# Patient Record
Sex: Male | Born: 1935 | Race: White | Hispanic: No | Marital: Married | Smoking: Former smoker
Health system: Southern US, Community
[De-identification: ages and names within clinical notes are randomized; demographics above are authoritative.]

## PROBLEM LIST (undated history)

## (undated) DIAGNOSIS — M8700 Idiopathic aseptic necrosis of unspecified bone: Secondary | ICD-10-CM

## (undated) DIAGNOSIS — M8580 Other specified disorders of bone density and structure, unspecified site: Secondary | ICD-10-CM

## (undated) DIAGNOSIS — E2740 Unspecified adrenocortical insufficiency: Secondary | ICD-10-CM

## (undated) DIAGNOSIS — K635 Polyp of colon: Secondary | ICD-10-CM

## (undated) DIAGNOSIS — H3582 Retinal ischemia: Secondary | ICD-10-CM

## (undated) DIAGNOSIS — I4891 Unspecified atrial fibrillation: Secondary | ICD-10-CM

## (undated) DIAGNOSIS — K4021 Bilateral inguinal hernia, without obstruction or gangrene, recurrent: Secondary | ICD-10-CM

## (undated) DIAGNOSIS — D649 Anemia, unspecified: Secondary | ICD-10-CM

## (undated) DIAGNOSIS — I499 Cardiac arrhythmia, unspecified: Secondary | ICD-10-CM

## (undated) DIAGNOSIS — K5090 Crohn's disease, unspecified, without complications: Secondary | ICD-10-CM

## (undated) DIAGNOSIS — R7989 Other specified abnormal findings of blood chemistry: Secondary | ICD-10-CM

## (undated) HISTORY — DX: Other specified abnormal findings of blood chemistry: R79.89

## (undated) HISTORY — DX: Polyp of colon: K63.5

## (undated) HISTORY — DX: Unspecified atrial fibrillation: I48.91

## (undated) HISTORY — DX: Other specified disorders of bone density and structure, unspecified site: M85.80

## (undated) HISTORY — DX: Anemia, unspecified: D64.9

## (undated) HISTORY — DX: Idiopathic aseptic necrosis of unspecified bone: M87.00

## (undated) HISTORY — DX: Unspecified adrenocortical insufficiency: E27.40

## (undated) HISTORY — PX: TONSILLECTOMY: SUR1361

## (undated) HISTORY — PX: SMALL INTESTINE SURGERY: SHX150

## (undated) HISTORY — PX: ABDOMINAL SURGERY: SHX537

## (undated) SURGERY — COLONOSCOPY WITH PROPOFOL
Anesthesia: Monitor Anesthesia Care

---

## 1998-01-08 ENCOUNTER — Ambulatory Visit (HOSPITAL_COMMUNITY): Admission: RE | Admit: 1998-01-08 | Discharge: 1998-01-08 | Payer: Self-pay | Admitting: Gastroenterology

## 1998-09-16 ENCOUNTER — Encounter: Payer: Self-pay | Admitting: Emergency Medicine

## 1998-09-16 ENCOUNTER — Inpatient Hospital Stay (HOSPITAL_COMMUNITY): Admission: EM | Admit: 1998-09-16 | Discharge: 1998-09-19 | Payer: Self-pay | Admitting: Emergency Medicine

## 1998-09-18 ENCOUNTER — Encounter: Payer: Self-pay | Admitting: Gastroenterology

## 2000-02-25 ENCOUNTER — Encounter: Payer: Self-pay | Admitting: Gastroenterology

## 2000-02-25 ENCOUNTER — Encounter: Admission: RE | Admit: 2000-02-25 | Discharge: 2000-02-25 | Payer: Self-pay | Admitting: Gastroenterology

## 2000-02-28 ENCOUNTER — Encounter: Admission: RE | Admit: 2000-02-28 | Discharge: 2000-02-28 | Payer: Self-pay | Admitting: Gastroenterology

## 2000-02-28 ENCOUNTER — Encounter: Payer: Self-pay | Admitting: Gastroenterology

## 2000-03-03 ENCOUNTER — Encounter: Payer: Self-pay | Admitting: Gastroenterology

## 2000-03-03 ENCOUNTER — Ambulatory Visit (HOSPITAL_COMMUNITY): Admission: RE | Admit: 2000-03-03 | Discharge: 2000-03-03 | Payer: Self-pay | Admitting: Gastroenterology

## 2000-03-27 ENCOUNTER — Encounter: Payer: Self-pay | Admitting: Surgery

## 2000-03-29 ENCOUNTER — Encounter: Payer: Self-pay | Admitting: Surgery

## 2000-03-29 ENCOUNTER — Inpatient Hospital Stay (HOSPITAL_COMMUNITY): Admission: RE | Admit: 2000-03-29 | Discharge: 2000-04-04 | Payer: Self-pay | Admitting: Surgery

## 2000-04-17 ENCOUNTER — Encounter: Payer: Self-pay | Admitting: Otolaryngology

## 2000-04-17 ENCOUNTER — Emergency Department (HOSPITAL_COMMUNITY): Admission: EM | Admit: 2000-04-17 | Discharge: 2000-04-17 | Payer: Self-pay | Admitting: *Deleted

## 2004-05-30 ENCOUNTER — Emergency Department (HOSPITAL_COMMUNITY): Admission: EM | Admit: 2004-05-30 | Discharge: 2004-05-30 | Payer: Self-pay | Admitting: Emergency Medicine

## 2010-03-17 ENCOUNTER — Other Ambulatory Visit: Payer: Self-pay | Admitting: Gastroenterology

## 2010-03-18 ENCOUNTER — Ambulatory Visit
Admission: RE | Admit: 2010-03-18 | Discharge: 2010-03-18 | Disposition: A | Discharge status: Home / Self Care | Payer: Medicare Other | Source: Ambulatory Visit | Attending: Gastroenterology | Admitting: Gastroenterology

## 2010-11-24 ENCOUNTER — Other Ambulatory Visit (HOSPITAL_COMMUNITY): Payer: Self-pay | Admitting: Gastroenterology

## 2010-11-24 DIAGNOSIS — K469 Unspecified abdominal hernia without obstruction or gangrene: Secondary | ICD-10-CM

## 2010-11-24 DIAGNOSIS — K5000 Crohn's disease of small intestine without complications: Secondary | ICD-10-CM

## 2010-12-01 ENCOUNTER — Ambulatory Visit (HOSPITAL_COMMUNITY)
Admission: RE | Admit: 2010-12-01 | Discharge: 2010-12-01 | Disposition: A | Discharge status: Home / Self Care | Payer: Medicare Other | Source: Ambulatory Visit | Attending: Gastroenterology | Admitting: Gastroenterology

## 2010-12-01 DIAGNOSIS — K5090 Crohn's disease, unspecified, without complications: Secondary | ICD-10-CM | POA: Insufficient documentation

## 2010-12-01 DIAGNOSIS — K5710 Diverticulosis of small intestine without perforation or abscess without bleeding: Secondary | ICD-10-CM | POA: Insufficient documentation

## 2010-12-01 DIAGNOSIS — K469 Unspecified abdominal hernia without obstruction or gangrene: Secondary | ICD-10-CM

## 2010-12-01 DIAGNOSIS — K56609 Unspecified intestinal obstruction, unspecified as to partial versus complete obstruction: Secondary | ICD-10-CM | POA: Insufficient documentation

## 2010-12-01 DIAGNOSIS — K449 Diaphragmatic hernia without obstruction or gangrene: Secondary | ICD-10-CM | POA: Insufficient documentation

## 2010-12-01 DIAGNOSIS — K4090 Unilateral inguinal hernia, without obstruction or gangrene, not specified as recurrent: Secondary | ICD-10-CM | POA: Insufficient documentation

## 2010-12-01 DIAGNOSIS — K5000 Crohn's disease of small intestine without complications: Secondary | ICD-10-CM

## 2010-12-23 ENCOUNTER — Ambulatory Visit (INDEPENDENT_AMBULATORY_CARE_PROVIDER_SITE_OTHER): Payer: Medicare Other

## 2010-12-23 DIAGNOSIS — J301 Allergic rhinitis due to pollen: Secondary | ICD-10-CM

## 2010-12-23 DIAGNOSIS — J019 Acute sinusitis, unspecified: Secondary | ICD-10-CM

## 2011-05-16 ENCOUNTER — Emergency Department (INDEPENDENT_AMBULATORY_CARE_PROVIDER_SITE_OTHER): Payer: Medicare Other

## 2011-05-16 ENCOUNTER — Emergency Department (HOSPITAL_BASED_OUTPATIENT_CLINIC_OR_DEPARTMENT_OTHER)
Admission: EM | Admit: 2011-05-16 | Discharge: 2011-05-16 | Disposition: A | Discharge status: Home / Self Care | Payer: Medicare Other | Attending: Emergency Medicine | Admitting: Emergency Medicine

## 2011-05-16 ENCOUNTER — Encounter (HOSPITAL_BASED_OUTPATIENT_CLINIC_OR_DEPARTMENT_OTHER): Payer: Self-pay | Admitting: *Deleted

## 2011-05-16 DIAGNOSIS — M899 Disorder of bone, unspecified: Secondary | ICD-10-CM

## 2011-05-16 DIAGNOSIS — W11XXXA Fall on and from ladder, initial encounter: Secondary | ICD-10-CM

## 2011-05-16 DIAGNOSIS — M47812 Spondylosis without myelopathy or radiculopathy, cervical region: Secondary | ICD-10-CM

## 2011-05-16 DIAGNOSIS — M51379 Other intervertebral disc degeneration, lumbosacral region without mention of lumbar back pain or lower extremity pain: Secondary | ICD-10-CM

## 2011-05-16 DIAGNOSIS — IMO0002 Reserved for concepts with insufficient information to code with codable children: Secondary | ICD-10-CM | POA: Insufficient documentation

## 2011-05-16 DIAGNOSIS — S0190XA Unspecified open wound of unspecified part of head, initial encounter: Secondary | ICD-10-CM

## 2011-05-16 DIAGNOSIS — G319 Degenerative disease of nervous system, unspecified: Secondary | ICD-10-CM

## 2011-05-16 DIAGNOSIS — S0990XA Unspecified injury of head, initial encounter: Secondary | ICD-10-CM | POA: Insufficient documentation

## 2011-05-16 DIAGNOSIS — S0100XA Unspecified open wound of scalp, initial encounter: Secondary | ICD-10-CM | POA: Insufficient documentation

## 2011-05-16 DIAGNOSIS — S0101XA Laceration without foreign body of scalp, initial encounter: Secondary | ICD-10-CM

## 2011-05-16 DIAGNOSIS — M7918 Myalgia, other site: Secondary | ICD-10-CM

## 2011-05-16 DIAGNOSIS — R51 Headache: Secondary | ICD-10-CM | POA: Insufficient documentation

## 2011-05-16 DIAGNOSIS — M545 Low back pain, unspecified: Secondary | ICD-10-CM | POA: Insufficient documentation

## 2011-05-16 DIAGNOSIS — M542 Cervicalgia: Secondary | ICD-10-CM

## 2011-05-16 DIAGNOSIS — W19XXXA Unspecified fall, initial encounter: Secondary | ICD-10-CM

## 2011-05-16 DIAGNOSIS — S5010XA Contusion of unspecified forearm, initial encounter: Secondary | ICD-10-CM | POA: Insufficient documentation

## 2011-05-16 DIAGNOSIS — M25559 Pain in unspecified hip: Secondary | ICD-10-CM | POA: Insufficient documentation

## 2011-05-16 DIAGNOSIS — M5137 Other intervertebral disc degeneration, lumbosacral region: Secondary | ICD-10-CM

## 2011-05-16 DIAGNOSIS — Y92009 Unspecified place in unspecified non-institutional (private) residence as the place of occurrence of the external cause: Secondary | ICD-10-CM | POA: Insufficient documentation

## 2011-05-16 DIAGNOSIS — IMO0001 Reserved for inherently not codable concepts without codable children: Secondary | ICD-10-CM | POA: Insufficient documentation

## 2011-05-16 MED ORDER — IBUPROFEN 600 MG PO TABS: 600.0000 mg | ORAL_TABLET | Freq: Four times a day (QID) | ORAL | Status: AC | PRN

## 2011-05-16 MED ORDER — HYDROCODONE-ACETAMINOPHEN 5-325 MG PO TABS: 2.0000 | ORAL_TABLET | Freq: Four times a day (QID) | ORAL | Status: AC | PRN

## 2011-05-16 NOTE — ED Notes (Signed)
MD at bedside. 

## 2011-05-16 NOTE — ED Provider Notes (Signed)
History  This chart was scribed for Chauncy Passy, MD by Jenne Campus. This patient was seen in room MH07/MH07 and the patient's care was started at 3:16PM.  CSN: [id number]  Arrival date & time 05/16/11  1506   First MD Initiated Contact with Patient 05/16/11 1516      Chief Complaint  Patient presents with  . Fall    Patient is a 78 y.o. male presenting with fall. The history is provided by the patient. No language interpreter was used.  Fall The accident occurred 1 to 2 hours ago. The fall occurred from a ladder. He landed on a hard floor. The volume of blood lost was minimal. The point of impact was the head and left hip. The pain is present in the head, left hip and neck. He was ambulatory at the scene. There was no entrapment after the fall. There was no alcohol use involved in the accident. Pertinent negatives include no fever, no abdominal pain, no nausea, no vomiting, no hematuria, no headaches and no loss of consciousness. The symptoms are aggravated by ambulation and use of the injured limb. He has tried nothing for the symptoms.    TATE ZAGAL is a 78 y.o. male who presents to the Emergency Department complaining of a fall from a ladder that occurred approximately one hour ago. Pt states that he had a ladder leaning against his gutter on his wet deck. He reports that while he was climbing the ladder (was at the 6th or 7th rung), it slipped and fell to the right side while he fell straight down onto his left back and left hip on his deck. He c/o left hip pain, lower back pain, neck stiffness, a bleeding laceration to his head and an abrasion to his left forearm.  Pt was ambulatory after the fall. Pt denies LOC. The pain is worse with walking. He has not taken any medications to improve his pain PTA. He denies chest pain, abdominal pain, HA and emesis as associated symptoms. His last TD vaccine was last year. He has a h/o glaucoma. He denies smoking and alcohol use.  Patient denies  needing anything for pain at this time.  Past Medical History  Diagnosis Date  . Glaucoma     Past Surgical History  Procedure Date  . Tonsillectomy   . Abdominal surgery     History reviewed. No pertinent family history.  History  Substance Use Topics  . Smoking status: Never Smoker   . Smokeless tobacco: Not on file  . Alcohol Use: No      Review of Systems  Constitutional: Negative for fever and chills.  HENT: Positive for neck stiffness. Negative for congestion and sore throat.   Eyes: Negative for pain.  Respiratory: Negative for cough and shortness of breath.   Cardiovascular: Negative for chest pain.  Gastrointestinal: Negative for nausea, vomiting and abdominal pain.  Genitourinary: Negative for dysuria, urgency and hematuria.  Musculoskeletal: Positive for back pain.       Left hip  Skin: Positive for wound (laceration to the head and abrasion to the left forearm).  Neurological: Negative for seizures, loss of consciousness and headaches.  Hematological: Does not bruise/bleed easily.  Psychiatric/Behavioral: Negative for confusion.    Allergies  Review of patient's allergies indicates no known allergies.  Home Medications  No current outpatient prescriptions on file.  Triage Vitals: BP 136/93  Pulse 103  Temp(Src) 97.7 F (36.5 C) (Oral)  Resp 16  Ht 5' 11"  (1.803 m)  Wt 195 lb (88.451 kg)  BMI 27.20 kg/m2  SpO2 100%  Physical Exam  Nursing note and vitals reviewed. Constitutional: He is oriented to person, place, and time. He appears well-developed and well-nourished.  HENT:  Head: Normocephalic.       Laceration to the head that is chevron shaped, 2 cm in length on both sides with 2 mm of gapping with minimal bleeding  Eyes: Conjunctivae and EOM are normal.  Neck: Normal range of motion. Neck supple.       No tenderness upon palpation of neck but pt c/o neck stiffness  Cardiovascular: Normal rate, regular rhythm and normal heart sounds.     Pulmonary/Chest: Effort normal and breath sounds normal. He has no wheezes. He has no rales.  Abdominal: Soft. There is no tenderness.  Musculoskeletal: Normal range of motion. He exhibits no edema.       No tenderness upon palpation of the C, T or L spine, no step offs noted, mild discomfort upon palpation of lower lumbar region, no tenderness upon palpation of the left wrist   Neurological: He is alert and oriented to person, place, and time. No cranial nerve deficit.  Skin: Skin is warm and dry.       Skin tear on left forearm that is 2 mm in size with several small areas of ecchymosis   Psychiatric: He has a normal mood and affect. His behavior is normal.    ED Course  Procedures (including critical care time)  LACERATION REPAIR Performed by: Chauncy Passy Authorized by: Chauncy Passy Consent: Verbal consent obtained. Risks and benefits: risks, benefits and alternatives were discussed Consent given by: patient Patient identity confirmed: provided demographic data Prepped and Draped in normal sterile fashion Wound explored  Laceration Location: scalp  Laceration Length: 2 cm  No Foreign Bodies seen or palpated  Anesthesia: local infiltration  Local anesthetic: none  Irrigation method: syringe Amount of cleaning: standard  Skin closure: staple  Number of sutures: 1  Patient tolerance: Patient tolerated the procedure well with no immediate complications.  DIAGNOSTIC STUDIES: Oxygen Saturation is 100% on room air, normal by my interpretation.    COORDINATION OF CARE: 3:23PM-Discussed CT scan of head and x-ray of back, hip and neck with pt and pt agreed. Pt turned down pain medications.   Labs Reviewed - No data to display Dg Lumbar Spine Complete  05/16/2011  *RADIOLOGY REPORT*  Clinical Data: 78 year old male status post fall from ladder.  Pain greater on the left.  LUMBAR SPINE - COMPLETE 4+ VIEW  Comparison: Upper GI 12/01/2010.  Findings: Normal lumbar  segmentation.  Advanced chronic L5-S1 disc space loss with vacuum disc phenomenon and endplate spurring. Facet hypertrophy also this level.  Lumbar vertebral bodies appear intact.  There is mild wedging of T12 which appears to be chronic. No pars fracture.  Grossly intact visualized sacrum.  IMPRESSION: 1. No acute fracture or listhesis identified in the lumbar spine. 2.  Advanced L5-S1 disc degeneration. 3.  Chronic-appearing T12 wedging.  Original Report Authenticated By: Randall An, M.D.   Dg Pelvis 1-2 Views  05/16/2011  *RADIOLOGY REPORT*  Clinical Data: Fall from ladder.  Low back pain.  PELVIS - 1-2 VIEW  Comparison: None.  Findings:  A single view pelvis demonstrates no acute bone or soft tissue abnormality.  Mild osteopenia is evident.  Degenerative changes are noted the lower lumbar spine and SI joints bilaterally.  IMPRESSION:  1.  Degenerative changes in the lower lumbar spine and SI  joints. 2.  Osteopenia without evidence for acute fracture.  Original Report Authenticated By: Resa Miner. MATTERN, M.D.   Ct Head Wo Contrast  05/16/2011  *RADIOLOGY REPORT*  Clinical Data:  Fall from ladder.  Laceration of head.  Possible loss of consciousness.  Pain in the posterior left neck.  CT HEAD WITHOUT CONTRAST CT CERVICAL SPINE WITHOUT CONTRAST  Technique:  Multidetector CT imaging of the head and cervical spine was performed following the standard protocol without intravenous contrast.  Multiplanar CT image reconstructions of the cervical spine were also generated.  Comparison:   None  CT HEAD  Findings: Mild generalized atrophy is present.  Mild diffuse white matter hypoattenuation is evident as well.  No acute cortical infarct, hemorrhage, mass lesion is present.  The ventricles are normal size.  No significant extra-axial fluid collection is present.  Chronic right maxillary sinus disease is evident with a shrunken sinus.  A remote to right medial orbital blowout fracture is evident.  No  significant mucosal disease or fluid levels are present.  Mastoid air cells are clear.  The debris in the external auditory canals bilaterally likely represents cerumen.  IMPRESSION:  1.  No acute intracranial abnormality or significant interval change. 2.  No evidence for acute trauma. 3.  Mild atrophy white matter disease.  This likely reflects the sequelae of chronic microvascular ischemia. 4.  Shrunken right maxillary sinus compatible with chronic disease.  CT CERVICAL SPINE  Findings: Cervical spine is imaged from the skull base through the cervicothoracic junction.  The prevertebral soft tissues are within normal limits.  Minimal atherosclerotic calcifications are noted at the right bifurcation.  Multilevel uncovertebral disease and facet hypertrophy leads to significant foraminal stenosis, worse on the right is C3-4, severe on the left at C4-5, severe on the right at C5-6, and moderate to severe on the left at C6-7.  Mild emphysematous changes are evident at the lung apices.  IMPRESSION:  1.  Moderate spondylosis of the cervical spine to the with right foraminal stenosis greatest at C3-4 and C5-6 and left-sided foraminal stenosis greatest at C4-5 and C6-7. 2.  No acute fracture or traumatic subluxation. 3.  Emphysema.  Original Report Authenticated By: Resa Miner. MATTERN, M.D.   Ct Cervical Spine Wo Contrast  05/16/2011  *RADIOLOGY REPORT*  Clinical Data:  Fall from ladder.  Laceration of head.  Possible loss of consciousness.  Pain in the posterior left neck.  CT HEAD WITHOUT CONTRAST CT CERVICAL SPINE WITHOUT CONTRAST  Technique:  Multidetector CT imaging of the head and cervical spine was performed following the standard protocol without intravenous contrast.  Multiplanar CT image reconstructions of the cervical spine were also generated.  Comparison:   None  CT HEAD  Findings: Mild generalized atrophy is present.  Mild diffuse white matter hypoattenuation is evident as well.  No acute cortical  infarct, hemorrhage, mass lesion is present.  The ventricles are normal size.  No significant extra-axial fluid collection is present.  Chronic right maxillary sinus disease is evident with a shrunken sinus.  A remote to right medial orbital blowout fracture is evident.  No significant mucosal disease or fluid levels are present.  Mastoid air cells are clear.  The debris in the external auditory canals bilaterally likely represents cerumen.  IMPRESSION:  1.  No acute intracranial abnormality or significant interval change. 2.  No evidence for acute trauma. 3.  Mild atrophy white matter disease.  This likely reflects the sequelae of chronic microvascular ischemia. 4.  Shrunken right  maxillary sinus compatible with chronic disease.  CT CERVICAL SPINE  Findings: Cervical spine is imaged from the skull base through the cervicothoracic junction.  The prevertebral soft tissues are within normal limits.  Minimal atherosclerotic calcifications are noted at the right bifurcation.  Multilevel uncovertebral disease and facet hypertrophy leads to significant foraminal stenosis, worse on the right is C3-4, severe on the left at C4-5, severe on the right at C5-6, and moderate to severe on the left at C6-7.  Mild emphysematous changes are evident at the lung apices.  IMPRESSION:  1.  Moderate spondylosis of the cervical spine to the with right foraminal stenosis greatest at C3-4 and C5-6 and left-sided foraminal stenosis greatest at C4-5 and C6-7. 2.  No acute fracture or traumatic subluxation. 3.  Emphysema.  Original Report Authenticated By: Resa Miner. MATTERN, M.D.     1. Musculoskeletal pain   2. Fall   3. Scalp laceration       MDM  Patient was evaluated by myself. I low suspicion for hip fracture given the patient a name Nanine Means. He did complain of pain over the left lower back. Plain films of the lumbar spine and pelvis were performed at that injury at this location was more likely. Given patient's fall he  did have a CT of his head. He did not have any loss of consciousness. Patient complained of neck stiffness and CT of the C-spine was performed as well. Laceration was repaired by myself with a single staple placed. Patient was told to return to any physician in 7 days for staple removal. Patient did complain of some abdominal discomfort following my initial evaluation. We discussed that if he had this initially I would have performed a CT of abdomen and pelvis with IV contrast. Patient reported that this was very much in keeping with his chronic issues with Crohn's disease and that he did not want a CT today. Given his presentation I felt that this was appropriate. Patient was discharged in good condition with prescription for ibuprofen 600 mg by mouth 3 times a day as well as Vicodin. He was told to anticipate that he'll have increased soreness tomorrow and possibly even the day after that.      I personally performed the services described in this documentation, which was scribed in my presence. The recorded information has been reviewed and considered.      Chauncy Passy, MD 05/16/11 [phone]

## 2011-05-16 NOTE — ED Notes (Signed)
Pt states he fell off a ladder 1 hr ago. Pain in left hip, lower back. Laceration on top of head and left wrist - denies pain on these sites.

## 2011-05-16 NOTE — ED Notes (Signed)
Pt c/o fall from ladder x 1 hr ago, pt c/o lower back and left hip pain, laceration to head and abrasion to left arm

## 2011-05-16 NOTE — Discharge Instructions (Signed)
Laceration Care, Adult A laceration is a cut or lesion that goes through all layers of the skin and into the tissue just beneath the skin. TREATMENT  Some lacerations may not require closure. Some lacerations may not be able to be closed due to an increased risk of infection. It is important to see your caregiver as soon as possible after an injury to minimize the risk of infection and maximize the opportunity for successful closure. If closure is appropriate, pain medicines may be given, if needed. The wound will be cleaned to help prevent infection. Your caregiver will use stitches (sutures), staples, wound glue (adhesive), or skin adhesive strips to repair the laceration. These tools bring the skin edges together to allow for faster healing and a better cosmetic outcome. However, all wounds will heal with a scar. Once the wound has healed, scarring can be minimized by covering the wound with sunscreen during the day for 1 full year. HOME CARE INSTRUCTIONS  For sutures or staples:  Keep the wound clean and dry.   If you were given a bandage (dressing), you should change it at least once a day. Also, change the dressing if it becomes wet or dirty, or as directed by your caregiver.   Wash the wound with soap and water 2 times a day. Rinse the wound off with water to remove all soap. Pat the wound dry with a clean towel.   After cleaning, apply a thin layer of the antibiotic ointment as recommended by your caregiver. This will help prevent infection and keep the dressing from sticking.   You may shower as usual after the first 24 hours. Do not soak the wound in water until the sutures are removed.   Only take over-the-counter or prescription medicines for pain, discomfort, or fever as directed by your caregiver.   Get your sutures or staples removed as directed by your caregiver.  For skin adhesive strips:  Keep the wound clean and dry.   Do not get the skin adhesive strips wet. You may bathe  carefully, using caution to keep the wound dry.   If the wound gets wet, pat it dry with a clean towel.   Skin adhesive strips will fall off on their own. You may trim the strips as the wound heals. Do not remove skin adhesive strips that are still stuck to the wound. They will fall off in time.  For wound adhesive:  You may briefly wet your wound in the shower or bath. Do not soak or scrub the wound. Do not swim. Avoid periods of heavy perspiration until the skin adhesive has fallen off on its own. After showering or bathing, gently pat the wound dry with a clean towel.   Do not apply liquid medicine, cream medicine, or ointment medicine to your wound while the skin adhesive is in place. This may loosen the film before your wound is healed.   If a dressing is placed over the wound, be careful not to apply tape directly over the skin adhesive. This may cause the adhesive to be pulled off before the wound is healed.   Avoid prolonged exposure to sunlight or tanning lamps while the skin adhesive is in place. Exposure to ultraviolet light in the first year will darken the scar.   The skin adhesive will usually remain in place for 5 to 10 days, then naturally fall off the skin. Do not pick at the adhesive film.  You may need a tetanus shot if:  You  cannot remember when you had your last tetanus shot.   You have never had a tetanus shot.  If you get a tetanus shot, your arm may swell, get red, and feel warm to the touch. This is common and not a problem. If you need a tetanus shot and you choose not to have one, there is a rare chance of getting tetanus. Sickness from tetanus can be serious. SEEK MEDICAL CARE IF:   You have redness, swelling, or increasing pain in the wound.   You see a red line that goes away from the wound.   You have yellowish-white fluid (pus) coming from the wound.   You have a fever.   You notice a bad smell coming from the wound or dressing.   Your wound breaks  open before or after sutures have been removed.   You notice something coming out of the wound such as wood or glass.   Your wound is on your hand or foot and you cannot move a finger or toe.  SEEK IMMEDIATE MEDICAL CARE IF:   Your pain is not controlled with prescribed medicine.   You have severe swelling around the wound causing pain and numbness or a change in color in your arm, hand, leg, or foot.   Your wound splits open and starts bleeding.   You have worsening numbness, weakness, or loss of function of any joint around or beyond the wound.   You develop painful lumps near the wound or on the skin anywhere on your body.  MAKE SURE YOU:   Understand these instructions.   Will watch your condition.   Will get help right away if you are not doing well or get worse.  Document Released: 12/27/2004 Document Revised: 12/16/2010 Document Reviewed: 06/22/2010 [facility] Patient Information 2012 [place].Musculoskeletal Pain Musculoskeletal pain is muscle and boney aches and pains. These pains can occur in any part of the body. Your caregiver may treat you without knowing the cause of the pain. They may treat you if blood or urine tests, X-rays, and other tests were normal.  CAUSES There is often not a definite cause or reason for these pains. These pains may be caused by a type of germ (virus). The discomfort may also come from overuse. Overuse includes working out too hard when your body is not fit. Boney aches also come from weather changes. Bone is sensitive to atmospheric pressure changes. HOME CARE INSTRUCTIONS   Ask when your test results will be ready. Make sure you get your test results.   Only take over-the-counter or prescription medicines for pain, discomfort, or fever as directed by your caregiver. If you were given medications for your condition, do not drive, operate machinery or power tools, or sign legal documents for 24 hours. Do not drink alcohol. Do not take  sleeping pills or other medications that may interfere with treatment.   Continue all activities unless the activities cause more pain. When the pain lessens, slowly resume normal activities. Gradually increase the intensity and duration of the activities or exercise.   During periods of severe pain, bed rest may be helpful. Lay or sit in any position that is comfortable.   Putting ice on the injured area.   Put ice in a bag.   Place a towel between your skin and the bag.   Leave the ice on for 15 to 20 minutes, 3 to 4 times a day.   Follow up with your caregiver for continued problems and no  reason can be found for the pain. If the pain becomes worse or does not go away, it may be necessary to repeat tests or do additional testing. Your caregiver may need to look further for a possible cause.  SEEK IMMEDIATE MEDICAL CARE IF:  You have pain that is getting worse and is not relieved by medications.   You develop chest pain that is associated with shortness or breath, sweating, feeling sick to your stomach (nauseous), or throw up (vomit).   Your pain becomes localized to the abdomen.   You develop any new symptoms that seem different or that concern you.  MAKE SURE YOU:   Understand these instructions.   Will watch your condition.   Will get help right away if you are not doing well or get worse.  Document Released: 12/27/2004 Document Revised: 12/16/2010 Document Reviewed: 08/17/2007 [facility] Patient Information 2012 [place].Staple Wound Closure Your wound has been cleaned and closed with skin staples. HOME CARE  Keep the area around the staples clean and dry.   Rest and raise (elevate) the injured part above the level of your heart.   See your doctor for a follow-up check of the wound.   See your doctor to have the staples removed.   As the wound heals, you may leave it open to the air and clean it daily with water.   Do not soak the wound in water for long  periods of time.   Watch for signs of a wound infection:   Unusual redness or puffiness around the wound.   Increasing pain or tenderness.   Yellowish white fluid (pus) coming from the wound.  You may need a tetanus shot if:  You cannot remember when you had your last tetanus shot.   You have never had a tetanus shot.   The injury broke your skin.  If you need a tetanus shot and you choose not to have one, you may get tetanus. Sickness from tetanus can be serious. GET HELP RIGHT AWAY IF:   You think the wound is infected.   The wound does not stay together after the staples have been taken out.   Something comes out of the wound, such as wood or glass.   You or your child has problems moving the injured area.   You or your child has a temperature by mouth above 102 F (38.9 C), not controlled by medicine.  MAKE SURE YOU:   Understand these instructions.   Will watch this condition.   Will get help right away if you or your child is not doing well or gets worse.  Document Released: 10/06/2007 Document Revised: 12/16/2010 Document Reviewed: 12/26/2007 [facility] Patient Information 2012 [place].

## 2011-11-16 ENCOUNTER — Encounter (INDEPENDENT_AMBULATORY_CARE_PROVIDER_SITE_OTHER): Payer: Medicare Other | Admitting: Ophthalmology

## 2011-11-16 DIAGNOSIS — H43819 Vitreous degeneration, unspecified eye: Secondary | ICD-10-CM

## 2011-11-16 DIAGNOSIS — H2700 Aphakia, unspecified eye: Secondary | ICD-10-CM

## 2011-11-16 DIAGNOSIS — H3530 Unspecified macular degeneration: Secondary | ICD-10-CM

## 2011-11-16 DIAGNOSIS — H35359 Cystoid macular degeneration, unspecified eye: Secondary | ICD-10-CM

## 2011-11-30 ENCOUNTER — Other Ambulatory Visit: Payer: Self-pay | Admitting: *Deleted

## 2011-11-30 DIAGNOSIS — H349 Unspecified retinal vascular occlusion: Secondary | ICD-10-CM

## 2011-12-05 ENCOUNTER — Encounter: Payer: Self-pay | Admitting: Vascular Surgery

## 2011-12-06 ENCOUNTER — Ambulatory Visit (INDEPENDENT_AMBULATORY_CARE_PROVIDER_SITE_OTHER): Payer: Medicare Other | Admitting: Vascular Surgery

## 2011-12-06 ENCOUNTER — Other Ambulatory Visit (INDEPENDENT_AMBULATORY_CARE_PROVIDER_SITE_OTHER): Payer: Medicare Other | Admitting: *Deleted

## 2011-12-06 ENCOUNTER — Encounter: Payer: Self-pay | Admitting: Vascular Surgery

## 2011-12-06 VITALS — BP 136/83 | HR 74 | Resp 16 | Ht 71.0 in | Wt 190.5 lb

## 2011-12-06 DIAGNOSIS — H3582 Retinal ischemia: Secondary | ICD-10-CM

## 2011-12-06 DIAGNOSIS — H349 Unspecified retinal vascular occlusion: Secondary | ICD-10-CM

## 2011-12-06 NOTE — Progress Notes (Signed)
[facility] [place]   Patient name: Michael [name] MRN: [id number] DOB: January 01, 1933 Sex: male   Referred by: Zigmund Daniel  Reason for referral:  Chief Complaint  Patient presents with  . New Evaluation    retinal ischemia  referred by Tempie Hoist MD    HISTORY OF PRESENT ILLNESS:  the patient is an otherwise healthy 78 year old gentleman who is being evaluated for vision changes in his right eye. Dr. Zigmund Daniel saw him for further evaluation of this and did not report any specific: Hollenhorst plaques but did raise the question of possible retinal ischemia. He is seen today for duplex to rule out carotid disease. He specifically denies any prior history of amaurosis fugax, transient ischemic attack or stroke. He denies any cardiac history. He does have a history of Crohn's disease  Past Medical History  Diagnosis Date  . Glaucoma(365)   . Anemia     Past Surgical History  Procedure Date  . Tonsillectomy   . Abdominal surgery   . Small intestine surgery 1968, 2001     related to Chrohn's disease    History   Social History  . Marital Status: Married    Spouse Name: N/A    Number of Children: N/A  . Years of Education: N/A   Occupational History  . Not on file.   Social History Main Topics  . Smoking status: Former Smoker    Quit date: 04/01/1976  . Smokeless tobacco: Not on file  . Alcohol Use: Yes     Comment: 2-3  glass wine  weekly  1-2 cans beer/ week  . Drug Use: No  . Sexually Active: No   Other Topics Concern  . Not on file   Social History Narrative  . No narrative on file    Family History  Problem Relation Age of Onset  . Deep vein thrombosis Mother   . Heart disease Father     Allergies as of 12/06/2011  . (No Known Allergies)    Current Outpatient Prescriptions on File Prior to Visit  Medication Sig Dispense Refill  . Cyanocobalamin (VITAMIN B-12 IJ) Inject as directed.      . IRON PO Take 1 tablet by mouth daily.       Marland Kitchen latanoprost (XALATAN) 0.005 % ophthalmic solution Place 1 drop into both eyes at bedtime.         REVIEW OF SYSTEMS:  Positives indicated with an "X"  CARDIOVASCULAR:  [ ]  chest pain   [ ]  chest pressure   [ ]  palpitations   [ ]  orthopnea   [ ]  dyspnea on exertion   [ ]  claudication   [ ]  rest pain   [ ]  DVT   [ ]  phlebitis PULMONARY:   [ ]  productive cough   [ ]  asthma   [ ]  wheezing NEUROLOGIC:   [ ]  weakness  [ ]  paresthesias  [ ]  aphasia  [ ]  amaurosis  [ ]  dizziness HEMATOLOGIC:   [ ]  bleeding problems   [ ]  clotting disorders MUSCULOSKELETAL:  [ ]  joint pain   [ ]  joint swelling GASTROINTESTINAL: [ ]   blood in stool  [ ]   hematemesis GENITOURINARY:  [ ]   dysuria  [ ]   hematuria PSYCHIATRIC:  [ ]  history of major depression INTEGUMENTARY:  [ ]  rashes  [ ]  ulcers CONSTITUTIONAL:  [ ]  fever   [ ]  chills  PHYSICAL EXAMINATION:  General: The patient is a well-nourished male, in no acute distress.  Vital signs are BP 136/83  Pulse 74  Resp 16  Ht 5' 11"  (1.803 m)  Wt 190 lb 8 oz (86.41 kg)  BMI 26.57 kg/m2 Pulmonary: There is a good air exchange bilaterally without wheezing or rales. Abdomen: Soft and non-tender with normal pitch bowel sounds. Musculoskeletal: There are no major deformities.  There is no significant extremity pain. Neurologic: No focal weakness or paresthesias are detected, Skin: There are no ulcer or rashes noted. Psychiatric: The patient has normal affect. Cardiovascular: There is a regular rate and rhythm without significant murmur appreciated. Pulse status: 2+ radial and 2+ dorsalis pedis pulses bilaterally Carotid arteries without bruits bilaterally  [facility] Vascular Lab Studies:  Ordered and Independently Reviewed this revealed no evidence of carotid stenosis or plaque present. No evidence of proximal stenosis.  Impression and Plan:  Normal a carotid study by physical exam and back carotid duplex. I discussed this at length with the patient and his  wife present. I do not feel that there is any evidence of vascular occlusive disease to explain any changes in his right eye. He was reassured this discussion and will continue his followup with Dr. Zigmund Daniel    Chaundra Abreu [facility] [place] Office: [phone]

## 2011-12-28 ENCOUNTER — Encounter (INDEPENDENT_AMBULATORY_CARE_PROVIDER_SITE_OTHER): Payer: Medicare Other | Admitting: Ophthalmology

## 2011-12-28 DIAGNOSIS — H3530 Unspecified macular degeneration: Secondary | ICD-10-CM

## 2011-12-28 DIAGNOSIS — H43819 Vitreous degeneration, unspecified eye: Secondary | ICD-10-CM

## 2011-12-28 DIAGNOSIS — H35359 Cystoid macular degeneration, unspecified eye: Secondary | ICD-10-CM

## 2012-02-28 ENCOUNTER — Encounter (INDEPENDENT_AMBULATORY_CARE_PROVIDER_SITE_OTHER): Payer: Medicare Other | Admitting: Ophthalmology

## 2012-02-28 DIAGNOSIS — H3530 Unspecified macular degeneration: Secondary | ICD-10-CM

## 2012-02-28 DIAGNOSIS — H43819 Vitreous degeneration, unspecified eye: Secondary | ICD-10-CM

## 2012-02-28 DIAGNOSIS — H35359 Cystoid macular degeneration, unspecified eye: Secondary | ICD-10-CM

## 2012-03-27 ENCOUNTER — Encounter (INDEPENDENT_AMBULATORY_CARE_PROVIDER_SITE_OTHER): Payer: Medicare Other | Admitting: Ophthalmology

## 2012-03-27 DIAGNOSIS — H35359 Cystoid macular degeneration, unspecified eye: Secondary | ICD-10-CM

## 2012-03-27 DIAGNOSIS — I10 Essential (primary) hypertension: Secondary | ICD-10-CM

## 2012-03-27 DIAGNOSIS — H43819 Vitreous degeneration, unspecified eye: Secondary | ICD-10-CM

## 2012-03-27 DIAGNOSIS — H35039 Hypertensive retinopathy, unspecified eye: Secondary | ICD-10-CM

## 2012-03-27 DIAGNOSIS — H4010X0 Unspecified open-angle glaucoma, stage unspecified: Secondary | ICD-10-CM

## 2012-06-26 ENCOUNTER — Encounter (INDEPENDENT_AMBULATORY_CARE_PROVIDER_SITE_OTHER): Payer: Medicare Other | Admitting: Ophthalmology

## 2012-06-26 DIAGNOSIS — H4010X0 Unspecified open-angle glaucoma, stage unspecified: Secondary | ICD-10-CM

## 2012-06-26 DIAGNOSIS — H43819 Vitreous degeneration, unspecified eye: Secondary | ICD-10-CM

## 2012-06-26 DIAGNOSIS — H35359 Cystoid macular degeneration, unspecified eye: Secondary | ICD-10-CM

## 2012-06-26 DIAGNOSIS — I10 Essential (primary) hypertension: Secondary | ICD-10-CM

## 2012-06-26 DIAGNOSIS — H35039 Hypertensive retinopathy, unspecified eye: Secondary | ICD-10-CM

## 2012-06-27 ENCOUNTER — Encounter (INDEPENDENT_AMBULATORY_CARE_PROVIDER_SITE_OTHER): Payer: Medicare Other | Admitting: Ophthalmology

## 2012-10-30 ENCOUNTER — Ambulatory Visit (INDEPENDENT_AMBULATORY_CARE_PROVIDER_SITE_OTHER): Payer: Medicare Other | Admitting: Ophthalmology

## 2012-10-30 DIAGNOSIS — H43819 Vitreous degeneration, unspecified eye: Secondary | ICD-10-CM

## 2012-10-30 DIAGNOSIS — H35359 Cystoid macular degeneration, unspecified eye: Secondary | ICD-10-CM

## 2013-04-01 ENCOUNTER — Encounter (INDEPENDENT_AMBULATORY_CARE_PROVIDER_SITE_OTHER): Payer: Commercial Managed Care - HMO | Admitting: Ophthalmology

## 2013-04-01 DIAGNOSIS — H35359 Cystoid macular degeneration, unspecified eye: Secondary | ICD-10-CM

## 2013-04-01 DIAGNOSIS — H43819 Vitreous degeneration, unspecified eye: Secondary | ICD-10-CM

## 2014-05-12 ENCOUNTER — Ambulatory Visit: Payer: Self-pay | Admitting: Surgery

## 2014-05-12 NOTE — H&P (Signed)
History of Present Illness Michael [name]. [name] MD; 05/12/2014 12:10 PM) Patient words: hernia.  Referred by Dr. Kelton Pillar for evaluation of bilateral inguinal hernias.  GI - Dr. Earle Gell This is a 81 year old male who presents with several years of palpable bilateral inguinal hernias on physical examination. These have never caused any discomfort until last Friday. He had some pain in the medial part of his left groin. This pain improved when he was supine. The patient has long-standing Crohn's disease and has persistent diarrhea and this seems to be unchanged. He denies any new urinary symptoms. The patient has had no further symptoms since Friday. The patient is fairly active and plays tennis twice a week. He presents now to discuss repair of his hernias. Other Problems ([name], LPN; 04/16/9627 52:84 AM) Crohn's Disease  Past Surgical History ([name], LPN; 01/12/2438 10:27 AM) Resection of Small Bowel  Diagnostic Studies History ([name], LPN; 02/14/3662 40:34 AM) Colonoscopy 1-5 years ago  Allergies ([name], LPN; 07/13/2593 63:87 AM) No Known Drug Allergies 05/12/2014  Medication History ([name], LPN; 05/16/4330 95:18 AM) Cosopt Ocumeter Plus (2-0.5% Solution, Ophthalmic) Active. Iron (Ferrous Gluconate) (325MG Tablet, Oral) Active.  Social History ([name], LPN; 08/14/1658 63:01 AM) Alcohol use Moderate alcohol use. Caffeine use Coffee. No drug use Tobacco use Former smoker.  Family History ([name], LPN; 6/0/1093 23:55 AM) Family history unknown First Degree Relatives     Review of Systems ([name] LPN; 07/12/2200 54:27 AM) General Not Present- Appetite Loss, Chills, Fatigue, Fever, Night Sweats, Weight Gain and Weight Loss. Skin Not Present- Change in Wart/Mole, Dryness, Hives, Jaundice, New Lesions, Non-Healing Wounds, Rash and Ulcer. HEENT Not Present- Earache, Hearing Loss, Hoarseness, Nose Bleed,  Oral Ulcers, Ringing in the Ears, Seasonal Allergies, Sinus Pain, Sore Throat, Visual Disturbances, Wears glasses/contact lenses and Yellow Eyes. Respiratory Not Present- Bloody sputum, Chronic Cough, Difficulty Breathing, Snoring and Wheezing. Breast Not Present- Breast Mass, Breast Pain, Nipple Discharge and Skin Changes. Cardiovascular Not Present- Chest Pain, Difficulty Breathing Lying Down, Leg Cramps, Palpitations, Rapid Heart Rate, Shortness of Breath and Swelling of Extremities. Gastrointestinal Present- Abdominal Pain. Not Present- Bloating, Bloody Stool, Change in Bowel Habits, Chronic diarrhea, Constipation, Difficulty Swallowing, Excessive gas, Gets full quickly at meals, Hemorrhoids, Indigestion, Nausea, Rectal Pain and Vomiting. Male Genitourinary Not Present- Frequency, Nocturia, Painful Urination, Pelvic Pain and Urgency. Musculoskeletal Not Present- Back Pain, Joint Pain, Joint Stiffness, Muscle Pain, Muscle Weakness and Swelling of Extremities. Neurological Not Present- Decreased Memory, Fainting, Headaches, Numbness, Seizures, Tingling, Tremor, Trouble walking and Weakness. Psychiatric Not Present- Anxiety, Bipolar, Change in Sleep Pattern, Depression, Fearful and Frequent crying. Endocrine Not Present- Cold Intolerance, Excessive Hunger, Hair Changes, Heat Intolerance, Hot flashes and New Diabetes. Hematology Not Present- Easy Bruising, Excessive bleeding, Gland problems, HIV and Persistent Infections.  Vitals ([name] LPN; 0/06/2374 28:31 AM) 05/12/2014 11:34 AM Weight: 180.8 lb Height: 71in Body Surface Area: 2.03 m Body Mass Index: 25.22 kg/m Pulse: 80 (Regular)  BP: 138/80 (Sitting, Left Arm, Standard)     Physical Exam Rodman Key K. [name] MD; 05/12/2014 12:10 PM)  The physical exam findings are as follows: Note:WDWN in NAD HEENT: EOMI, sclera anicteric Neck: No masses, no thyromegaly Lungs: CTA bilaterally; normal respiratory effort CV: Regular rate  and rhythm; no murmurs Abd: +bowel sounds, soft, non-tender, no masses GU: bilateral descended testes; no testicular masses; large bilateral inguinal hernias (left slightly larger than right) Both hernias are easily reducible when supine. Ext: Well-perfused; no edema Skin: Warm, dry;  no sign of jaundice    Assessment & Plan Michael [name]. Talyn Dessert MD; 05/12/2014 11:57 AM)  BILATERAL RECURRENT INGUINAL HERNIA WITHOUT OBSTRUCTION OR GANGRENE (550.93  K40.21)  Current Plans Schedule for Surgery - open bilateral inguinal hernia repairs with mesh. The surgical procedure has been discussed with the patient. Potential risks, benefits, alternative treatments, and expected outcomes have been explained. All of the patient's questions at this time have been answered. The likelihood of reaching the patient's treatment goal is good. The patient understand the proposed surgical procedure and wishes to proceed. Pt Education - [facility] Umbilical/ Inguinal Hernia HCI   Michael [name]. Georgette Dover, MD, [facility] Surgery  General/ Trauma Surgery  05/12/2014 12:11 PM

## 2014-05-16 ENCOUNTER — Encounter (HOSPITAL_BASED_OUTPATIENT_CLINIC_OR_DEPARTMENT_OTHER): Payer: Self-pay | Admitting: *Deleted

## 2014-05-21 ENCOUNTER — Encounter (HOSPITAL_BASED_OUTPATIENT_CLINIC_OR_DEPARTMENT_OTHER): Payer: Self-pay | Admitting: *Deleted

## 2014-05-21 ENCOUNTER — Ambulatory Visit (HOSPITAL_COMMUNITY): Payer: PPO | Admitting: Certified Registered"

## 2014-05-21 ENCOUNTER — Encounter (HOSPITAL_BASED_OUTPATIENT_CLINIC_OR_DEPARTMENT_OTHER)
Admission: RE | Disposition: A | Discharge status: Home / Self Care | Payer: Self-pay | Source: Ambulatory Visit | Attending: Surgery

## 2014-05-21 ENCOUNTER — Ambulatory Visit (HOSPITAL_BASED_OUTPATIENT_CLINIC_OR_DEPARTMENT_OTHER)
Admission: RE | Admit: 2014-05-21 | Discharge: 2014-05-21 | Disposition: A | Discharge status: Home / Self Care | Payer: PPO | Source: Ambulatory Visit | Attending: Surgery | Admitting: Surgery

## 2014-05-21 DIAGNOSIS — Z79899 Other long term (current) drug therapy: Secondary | ICD-10-CM | POA: Diagnosis not present

## 2014-05-21 DIAGNOSIS — K4021 Bilateral inguinal hernia, without obstruction or gangrene, recurrent: Secondary | ICD-10-CM | POA: Insufficient documentation

## 2014-05-21 DIAGNOSIS — K4020 Bilateral inguinal hernia, without obstruction or gangrene, not specified as recurrent: Secondary | ICD-10-CM | POA: Diagnosis present

## 2014-05-21 DIAGNOSIS — Z87891 Personal history of nicotine dependence: Secondary | ICD-10-CM | POA: Insufficient documentation

## 2014-05-21 DIAGNOSIS — D176 Benign lipomatous neoplasm of spermatic cord: Secondary | ICD-10-CM | POA: Diagnosis not present

## 2014-05-21 HISTORY — DX: Crohn's disease, unspecified, without complications: K50.90

## 2014-05-21 HISTORY — PX: INGUINAL HERNIA REPAIR: SHX194

## 2014-05-21 HISTORY — DX: Bilateral inguinal hernia, without obstruction or gangrene, recurrent: K40.21

## 2014-05-21 LAB — POCT HEMOGLOBIN-HEMACUE: Hemoglobin: 10.3 g/dL — ABNORMAL LOW (ref 13.0–17.0)

## 2014-05-21 SURGERY — REPAIR, HERNIA, INGUINAL, ADULT
Anesthesia: Regional | Site: Abdomen | Laterality: Bilateral

## 2014-05-21 MED ORDER — LACTATED RINGERS IV SOLN
INTRAVENOUS | Status: DC
  Administered 2014-05-21 (×2): via INTRAVENOUS

## 2014-05-21 MED ORDER — DEXAMETHASONE SODIUM PHOSPHATE 4 MG/ML IJ SOLN
INTRAMUSCULAR | Status: DC | PRN
  Administered 2014-05-21: 10 mg via INTRAVENOUS

## 2014-05-21 MED ORDER — ONDANSETRON HCL 4 MG/2ML IJ SOLN
4.0000 mg | INTRAMUSCULAR | Status: DC | PRN
Start: 2014-05-21 — End: 2014-05-21

## 2014-05-21 MED ORDER — OXYCODONE HCL 5 MG PO TABS
5.0000 mg | ORAL_TABLET | Freq: Once | ORAL | Status: AC | PRN
  Administered 2014-05-21: 5 mg via ORAL

## 2014-05-21 MED ORDER — OXYCODONE HCL 5 MG PO TABS
ORAL_TABLET | ORAL | Status: AC
  Filled 2014-05-21: qty 1

## 2014-05-21 MED ORDER — ONDANSETRON HCL 4 MG/2ML IJ SOLN
INTRAMUSCULAR | Status: DC | PRN
  Administered 2014-05-21: 4 mg via INTRAVENOUS

## 2014-05-21 MED ORDER — CEFAZOLIN SODIUM-DEXTROSE 2-3 GM-% IV SOLR
2.0000 g | INTRAVENOUS | Status: AC
  Administered 2014-05-21: 2 g via INTRAVENOUS

## 2014-05-21 MED ORDER — PROPOFOL 10 MG/ML IV BOLUS
INTRAVENOUS | Status: DC | PRN
  Administered 2014-05-21: 120 mg via INTRAVENOUS

## 2014-05-21 MED ORDER — BUPIVACAINE-EPINEPHRINE (PF) 0.25% -1:200000 IJ SOLN
INTRAMUSCULAR | Status: AC
  Filled 2014-05-21: qty 30

## 2014-05-21 MED ORDER — HYDROCODONE-ACETAMINOPHEN 5-325 MG PO TABS: 1.0000 | ORAL_TABLET | ORAL | Status: DC | PRN

## 2014-05-21 MED ORDER — MIDAZOLAM HCL 2 MG/2ML IJ SOLN
1.0000 mg | INTRAMUSCULAR | Status: DC | PRN
  Administered 2014-05-21: 1 mg via INTRAVENOUS

## 2014-05-21 MED ORDER — FENTANYL CITRATE (PF) 100 MCG/2ML IJ SOLN
25.0000 ug | INTRAMUSCULAR | Status: DC | PRN
  Administered 2014-05-21 (×2): 25 ug via INTRAVENOUS

## 2014-05-21 MED ORDER — MIDAZOLAM HCL 2 MG/2ML IJ SOLN
INTRAMUSCULAR | Status: AC
  Filled 2014-05-21: qty 2

## 2014-05-21 MED ORDER — CHLORHEXIDINE GLUCONATE 4 % EX LIQD: 1.0000 "application " | Freq: Once | CUTANEOUS | Status: DC

## 2014-05-21 MED ORDER — SUCCINYLCHOLINE CHLORIDE 20 MG/ML IJ SOLN
INTRAMUSCULAR | Status: AC
  Filled 2014-05-21: qty 1

## 2014-05-21 MED ORDER — FENTANYL CITRATE (PF) 100 MCG/2ML IJ SOLN
INTRAMUSCULAR | Status: DC | PRN
  Administered 2014-05-21: 50 ug via INTRAVENOUS

## 2014-05-21 MED ORDER — MORPHINE SULFATE 2 MG/ML IJ SOLN: 2.0000 mg | INTRAMUSCULAR | Status: DC | PRN

## 2014-05-21 MED ORDER — ROCURONIUM BROMIDE 100 MG/10ML IV SOLN
INTRAVENOUS | Status: DC | PRN
  Administered 2014-05-21: 30 mg via INTRAVENOUS

## 2014-05-21 MED ORDER — FENTANYL CITRATE (PF) 100 MCG/2ML IJ SOLN
INTRAMUSCULAR | Status: AC
  Filled 2014-05-21: qty 2

## 2014-05-21 MED ORDER — NEOSTIGMINE METHYLSULFATE 10 MG/10ML IV SOLN
INTRAVENOUS | Status: DC | PRN
  Administered 2014-05-21: 4 mg via INTRAVENOUS

## 2014-05-21 MED ORDER — ROCURONIUM BROMIDE 50 MG/5ML IV SOLN
INTRAVENOUS | Status: AC
  Filled 2014-05-21: qty 1

## 2014-05-21 MED ORDER — PROMETHAZINE HCL 25 MG/ML IJ SOLN: 6.2500 mg | INTRAMUSCULAR | Status: DC | PRN

## 2014-05-21 MED ORDER — PROPOFOL 10 MG/ML IV BOLUS
INTRAVENOUS | Status: AC
  Filled 2014-05-21: qty 20

## 2014-05-21 MED ORDER — LIDOCAINE HCL (CARDIAC) 20 MG/ML IV SOLN
INTRAVENOUS | Status: DC | PRN
  Administered 2014-05-21: 30 mg via INTRAVENOUS

## 2014-05-21 MED ORDER — FENTANYL CITRATE (PF) 100 MCG/2ML IJ SOLN
50.0000 ug | INTRAMUSCULAR | Status: DC | PRN
  Administered 2014-05-21: 50 ug via INTRAVENOUS

## 2014-05-21 MED ORDER — BUPIVACAINE-EPINEPHRINE (PF) 0.25% -1:200000 IJ SOLN
INTRAMUSCULAR | Status: DC | PRN
Start: 2014-05-21 — End: 2014-05-21
  Administered 2014-05-21 (×2): 30 mL

## 2014-05-21 MED ORDER — GLYCOPYRROLATE 0.2 MG/ML IJ SOLN
0.2000 mg | Freq: Once | INTRAMUSCULAR | Status: AC | PRN
  Administered 2014-05-21: 0.6 mg via INTRAVENOUS

## 2014-05-21 MED ORDER — CEFAZOLIN SODIUM-DEXTROSE 2-3 GM-% IV SOLR
INTRAVENOUS | Status: AC
  Filled 2014-05-21: qty 50

## 2014-05-21 MED ORDER — FENTANYL CITRATE (PF) 100 MCG/2ML IJ SOLN
INTRAMUSCULAR | Status: AC
  Filled 2014-05-21: qty 6

## 2014-05-21 MED ORDER — BUPIVACAINE-EPINEPHRINE 0.25% -1:200000 IJ SOLN
INTRAMUSCULAR | Status: DC | PRN
  Administered 2014-05-21: 20 mL

## 2014-05-21 MED ORDER — OXYCODONE HCL 5 MG/5ML PO SOLN: 5.0000 mg | Freq: Once | ORAL | Status: AC | PRN

## 2014-05-21 SURGICAL SUPPLY — 56 items
APL SKNCLS STERI-STRIP NONHPOA (GAUZE/BANDAGES/DRESSINGS) ×1
BENZOIN TINCTURE PRP APPL 2/3 (GAUZE/BANDAGES/DRESSINGS) ×4 IMPLANT
BLADE CLIPPER SURG (BLADE) ×4 IMPLANT
BLADE HEX COATED 2.75 (ELECTRODE) ×4 IMPLANT
BLADE SURG 15 STRL LF DISP TIS (BLADE) ×4 IMPLANT
BLADE SURG 15 STRL SS (BLADE) ×8
CANISTER SUCT 1200ML W/VALVE (MISCELLANEOUS) IMPLANT
CHLORAPREP W/TINT 26ML (MISCELLANEOUS) ×4 IMPLANT
CLOSURE WOUND 1/2 X4 (GAUZE/BANDAGES/DRESSINGS) ×2
COVER BACK TABLE 60X90IN (DRAPES) ×4 IMPLANT
COVER MAYO STAND STRL (DRAPES) ×4 IMPLANT
DECANTER SPIKE VIAL GLASS SM (MISCELLANEOUS) ×4 IMPLANT
DRAIN PENROSE 1/2X12 LTX STRL (WOUND CARE) ×4 IMPLANT
DRAPE LAPAROTOMY TRNSV 102X78 (DRAPE) ×4 IMPLANT
DRAPE UTILITY XL STRL (DRAPES) ×4 IMPLANT
DRSG TEGADERM 4X4.75 (GAUZE/BANDAGES/DRESSINGS) ×4 IMPLANT
ELECT REM PT RETURN 9FT ADLT (ELECTROSURGICAL) ×4
ELECTRODE REM PT RTRN 9FT ADLT (ELECTROSURGICAL) ×2 IMPLANT
GLOVE BIO SURGEON STRL SZ7 (GLOVE) ×4 IMPLANT
GLOVE BIOGEL PI IND STRL 7.0 (GLOVE) ×2 IMPLANT
GLOVE BIOGEL PI IND STRL 7.5 (GLOVE) ×2 IMPLANT
GLOVE BIOGEL PI INDICATOR 7.0 (GLOVE) ×2
GLOVE BIOGEL PI INDICATOR 7.5 (GLOVE) ×2
GLOVE ECLIPSE 6.5 STRL STRAW (GLOVE) ×4 IMPLANT
GOWN STRL REUS W/ TWL LRG LVL3 (GOWN DISPOSABLE) ×4 IMPLANT
GOWN STRL REUS W/TWL LRG LVL3 (GOWN DISPOSABLE) ×6
MESH PARIETEX PROGRIP LEFT (Mesh General) ×4 IMPLANT
MESH PARIETEX PROGRIP RIGHT (Mesh General) ×4 IMPLANT
NEEDLE HYPO 25X1 1.5 SAFETY (NEEDLE) ×4 IMPLANT
NS IRRIG 1000ML POUR BTL (IV SOLUTION) IMPLANT
PACK BASIN DAY SURGERY FS (CUSTOM PROCEDURE TRAY) ×4 IMPLANT
PENCIL BUTTON HOLSTER BLD 10FT (ELECTRODE) ×4 IMPLANT
SLEEVE SCD COMPRESS KNEE MED (MISCELLANEOUS) ×4 IMPLANT
SPONGE GAUZE 4X4 12PLY STER LF (GAUZE/BANDAGES/DRESSINGS) ×4 IMPLANT
SPONGE INTESTINAL PEANUT (DISPOSABLE) ×4 IMPLANT
SPONGE LAP 18X18 X RAY DECT (DISPOSABLE) IMPLANT
STRIP CLOSURE SKIN 1/2X4 (GAUZE/BANDAGES/DRESSINGS) ×6 IMPLANT
SUT MON AB 4-0 PC3 18 (SUTURE) ×8 IMPLANT
SUT PDS AB 0 CT 36 (SUTURE) IMPLANT
SUT SILK 2 0 SH (SUTURE) IMPLANT
SUT SILK 3 0 SH 30 (SUTURE) IMPLANT
SUT SILK 3 0 TIES 17X18 (SUTURE)
SUT SILK 3-0 18XBRD TIE BLK (SUTURE) IMPLANT
SUT VIC AB 0 CT1 27 (SUTURE) ×4
SUT VIC AB 0 CT1 27XBRD ANBCTR (SUTURE) ×4 IMPLANT
SUT VIC AB 0 SH 27 (SUTURE) ×4 IMPLANT
SUT VIC AB 2-0 SH 27 (SUTURE) ×4
SUT VIC AB 2-0 SH 27XBRD (SUTURE) ×4 IMPLANT
SUT VIC AB 3-0 SH 27 (SUTURE) ×6
SUT VIC AB 3-0 SH 27X BRD (SUTURE) ×4 IMPLANT
SYR CONTROL 10ML LL (SYRINGE) ×4 IMPLANT
TOWEL OR 17X24 6PK STRL BLUE (TOWEL DISPOSABLE) ×4 IMPLANT
TOWEL OR NON WOVEN STRL DISP B (DISPOSABLE) ×4 IMPLANT
TUBE CONNECTING 20'X1/4 (TUBING)
TUBE CONNECTING 20X1/4 (TUBING) IMPLANT
YANKAUER SUCT BULB TIP NO VENT (SUCTIONS) IMPLANT

## 2014-05-21 NOTE — Interval H&P Note (Signed)
History and Physical Interval Note:  05/21/2014 12:23 PM  Michael [name]  has presented today for surgery, with the diagnosis of Bilateral Inguinal Hernias  The various methods of treatment have been discussed with the patient and family. After consideration of risks, benefits and other options for treatment, the patient has consented to  Procedure(s): OPEN BILATERAL INGUINAL HERNIA REPAIRS WITH MESH (Bilateral) INSERTION OF MESH (Bilateral) as a surgical intervention .  The patient's history has been reviewed, patient examined, no change in status, stable for surgery.  I have reviewed the patient's chart and labs.  Questions were answered to the patient's satisfaction.     Seymone Forlenza K.

## 2014-05-21 NOTE — Anesthesia Procedure Notes (Addendum)
Procedure Name: Intubation Date/Time: 05/21/2014 1:08 PM Performed by: BLOCKER, TIMOTHY D Pre-anesthesia Checklist: Patient identified, Emergency Drugs available, Suction available and Patient being monitored Patient Re-evaluated:Patient Re-evaluated prior to inductionOxygen Delivery Method: Circle System Utilized Preoxygenation: Pre-oxygenation with 100% oxygen Intubation Type: IV induction Ventilation: Mask ventilation without difficulty Laryngoscope Size: Mac and 3 Grade View: Grade I Tube type: Oral Tube size: 7.0 mm Number of attempts: 1 Airway Equipment and Method: Stylet and Oral airway Placement Confirmation: ETT inserted through vocal cords under direct vision,  positive ETCO2 and breath sounds checked- equal and bilateral Secured at: 21 cm Tube secured with: Tape Dental Injury: Teeth and Oropharynx as per pre-operative assessment    Anesthesia Regional Block:  TAP block  Pre-Anesthetic Checklist: ,, timeout performed, Correct Patient, Correct Site, Correct Laterality, Correct Procedure, Correct Position, site marked, Risks and benefits discussed,  Surgical consent,  Pre-op evaluation,  At surgeon's request and post-op pain management  Laterality: Right and Left  Prep: chloraprep       Needles:  Injection technique: Single-shot  Needle Type: Echogenic Needle     Needle Length: 9cm 9 cm Needle Gauge: 21 and 21 G    Additional Needles:  Procedures: ultrasound guided (picture in chart) TAP block Narrative:  Start time: 05/21/2014 12:45 PM End time: 05/21/2014 1:00 PM Injection made incrementally with aspirations every 5 mL.  Performed by: Personally  Anesthesiologist: Suzette Battiest  Additional Notes: Risks and benefits discussed. Bilateral TAP blocks performed using sterile technique with live u/s guidance using linear probe. Pt tolerated procedure well with no immediate complications.

## 2014-05-21 NOTE — Progress Notes (Signed)
Assisted Dr. Rodman Comp with right and left, ultrasound guided, transabdominal plane block. Side rails up, monitors on throughout procedure. See vital signs in flow sheet. Tolerated Procedure well.

## 2014-05-21 NOTE — H&P (View-Only) (Signed)
History of Present Illness Michael [name]. Michael Reindl MD; 05/12/2014 12:10 PM) Patient words: hernia.  Referred by Dr. Kelton Pillar for evaluation of bilateral inguinal hernias.  GI - Dr. Earle Gell This is a 81 year old male who presents with several years of palpable bilateral inguinal hernias on physical examination. These have never caused any discomfort until last Friday. He had some pain in the medial part of his left groin. This pain improved when he was supine. The patient has long-standing Crohn's disease and has persistent diarrhea and this seems to be unchanged. He denies any new urinary symptoms. The patient has had no further symptoms since Friday. The patient is fairly active and plays tennis twice a week. He presents now to discuss repair of his hernias. Other Problems ([name], LPN; 08/17/3252 98:26 AM) Crohn's Disease  Past Surgical History ([name], LPN; 04/10/5828 94:07 AM) Resection of Small Bowel  Diagnostic Studies History ([name], LPN; 06/18/879 10:31 AM) Colonoscopy 1-5 years ago  Allergies ([name], LPN; 05/19/4583 92:92 AM) No Known Drug Allergies 05/12/2014  Medication History ([name], LPN; 04/13/6284 38:17 AM) Cosopt Ocumeter Plus (2-0.5% Solution, Ophthalmic) Active. Iron (Ferrous Gluconate) (325MG Tablet, Oral) Active.  Social History ([name], LPN; 07/11/1655 90:38 AM) Alcohol use Moderate alcohol use. Caffeine use Coffee. No drug use Tobacco use Former smoker.  Family History ([name], LPN; 03/13/3830 91:91 AM) Family history unknown First Degree Relatives     Review of Systems ([name] LPN; 06/15/598 45:99 AM) General Not Present- Appetite Loss, Chills, Fatigue, Fever, Night Sweats, Weight Gain and Weight Loss. Skin Not Present- Change in Wart/Mole, Dryness, Hives, Jaundice, New Lesions, Non-Healing Wounds, Rash and Ulcer. HEENT Not Present- Earache, Hearing Loss, Hoarseness, Nose Bleed,  Oral Ulcers, Ringing in the Ears, Seasonal Allergies, Sinus Pain, Sore Throat, Visual Disturbances, Wears glasses/contact lenses and Yellow Eyes. Respiratory Not Present- Bloody sputum, Chronic Cough, Difficulty Breathing, Snoring and Wheezing. Breast Not Present- Breast Mass, Breast Pain, Nipple Discharge and Skin Changes. Cardiovascular Not Present- Chest Pain, Difficulty Breathing Lying Down, Leg Cramps, Palpitations, Rapid Heart Rate, Shortness of Breath and Swelling of Extremities. Gastrointestinal Present- Abdominal Pain. Not Present- Bloating, Bloody Stool, Change in Bowel Habits, Chronic diarrhea, Constipation, Difficulty Swallowing, Excessive gas, Gets full quickly at meals, Hemorrhoids, Indigestion, Nausea, Rectal Pain and Vomiting. Male Genitourinary Not Present- Frequency, Nocturia, Painful Urination, Pelvic Pain and Urgency. Musculoskeletal Not Present- Back Pain, Joint Pain, Joint Stiffness, Muscle Pain, Muscle Weakness and Swelling of Extremities. Neurological Not Present- Decreased Memory, Fainting, Headaches, Numbness, Seizures, Tingling, Tremor, Trouble walking and Weakness. Psychiatric Not Present- Anxiety, Bipolar, Change in Sleep Pattern, Depression, Fearful and Frequent crying. Endocrine Not Present- Cold Intolerance, Excessive Hunger, Hair Changes, Heat Intolerance, Hot flashes and New Diabetes. Hematology Not Present- Easy Bruising, Excessive bleeding, Gland problems, HIV and Persistent Infections.  Vitals ([name] LPN; 07/16/4140 39:53 AM) 05/12/2014 11:34 AM Weight: 180.8 lb Height: 71in Body Surface Area: 2.03 m Body Mass Index: 25.22 kg/m Pulse: 80 (Regular)  BP: 138/80 (Sitting, Left Arm, Standard)     Physical Exam Michael Key K. Hazelgrace Bonham MD; 05/12/2014 12:10 PM)  The physical exam findings are as follows: Note:WDWN in NAD HEENT: EOMI, sclera anicteric Neck: No masses, no thyromegaly Lungs: CTA bilaterally; normal respiratory effort CV: Regular rate  and rhythm; no murmurs Abd: +bowel sounds, soft, non-tender, no masses GU: bilateral descended testes; no testicular masses; large bilateral inguinal hernias (left slightly larger than right) Both hernias are easily reducible when supine. Ext: Well-perfused; no edema Skin: Warm, dry;  no sign of jaundice    Assessment & Plan Michael [name]. Michael Needles MD; 05/12/2014 11:57 AM)  BILATERAL RECURRENT INGUINAL HERNIA WITHOUT OBSTRUCTION OR GANGRENE (550.93  K40.21)  Current Plans Schedule for Surgery - open bilateral inguinal hernia repairs with mesh. The surgical procedure has been discussed with the patient. Potential risks, benefits, alternative treatments, and expected outcomes have been explained. All of the patient's questions at this time have been answered. The likelihood of reaching the patient's treatment goal is good. The patient understand the proposed surgical procedure and wishes to proceed. Pt Education - [facility] Umbilical/ Inguinal Hernia HCI   Michael [name]. Georgette Dover, MD, [facility] Surgery  General/ Trauma Surgery  05/12/2014 12:11 PM

## 2014-05-21 NOTE — Anesthesia Postprocedure Evaluation (Signed)
  Anesthesia Post-op Note  Patient: Michael [name]  Procedure(s) Performed: Procedure(s): OPEN BILATERAL INGUINAL HERNIA REPAIRS WITH MESH (Bilateral)  Patient Location: PACU  Anesthesia Type:GA combined with regional for post-op pain  Level of Consciousness: awake and alert   Airway and Oxygen Therapy: Patient Spontanous Breathing  Post-op Pain: mild  Post-op Assessment: Post-op Vital signs reviewed  Post-op Vital Signs: Reviewed  Last Vitals:  Filed Vitals:   05/21/14 1659  BP: 150/73  Pulse: 63  Temp: 36.5 C  Resp: 16    Complications: No apparent anesthesia complications

## 2014-05-21 NOTE — Transfer of Care (Signed)
Immediate Anesthesia Transfer of Care Note  Patient: Michael [name]  Procedure(s) Performed: Procedure(s): OPEN BILATERAL INGUINAL HERNIA REPAIRS WITH MESH (Bilateral)  Patient Location: PACU  Anesthesia Type:GA combined with regional for post-op pain  Level of Consciousness: awake, alert , oriented and patient cooperative  Airway & Oxygen Therapy: Patient Spontanous Breathing and Patient connected to face mask oxygen  Post-op Assessment: Report given to RN and Post -op Vital signs reviewed and stable  Post vital signs: Reviewed and stable  Last Vitals:  Filed Vitals:   05/21/14 1255  BP: 146/74  Pulse: 68  Temp:   Resp: 14    Complications: No apparent anesthesia complications

## 2014-05-21 NOTE — Anesthesia Preprocedure Evaluation (Addendum)
Anesthesia Evaluation  Patient identified by MRN, date of birth, ID band Patient awake    Reviewed: Allergy & Precautions, NPO status , Patient's Chart, lab work & pertinent test results  Airway Mallampati: II  TM Distance: >3 FB Neck ROM: Full    Dental  (+) Teeth Intact, Dental Advisory Given   Pulmonary former smoker,  breath sounds clear to auscultation        Cardiovascular negative cardio ROS  Rhythm:Regular Rate:Normal     Neuro/Psych negative neurological ROS     GI/Hepatic Neg liver ROS, Crohns dz    Endo/Other  negative endocrine ROS  Renal/GU negative Renal ROS     Musculoskeletal   Abdominal   Peds  Hematology negative hematology ROS (+)   Anesthesia Other Findings   Reproductive/Obstetrics                            Anesthesia Physical Anesthesia Plan  ASA: II  Anesthesia Plan: General and Regional   Post-op Pain Management:    Induction: Intravenous  Airway Management Planned: LMA and Oral ETT  Additional Equipment:   Intra-op Plan:   Post-operative Plan: Extubation in OR  Informed Consent: I have reviewed the patients History and Physical, chart, labs and discussed the procedure including the risks, benefits and alternatives for the proposed anesthesia with the patient or authorized representative who has indicated his/her understanding and acceptance.   Dental advisory given  Plan Discussed with: CRNA  Anesthesia Plan Comments:         Anesthesia Quick Evaluation

## 2014-05-21 NOTE — Op Note (Signed)
Hernia, Open, Procedure Note  Indications: The patient presented with a history of a bilateral, reducible inguinal hernia.    Pre-operative Diagnosis: bilateral reducible inguinal hernia Post-operative Diagnosis: same  Surgeon: Maia Petties.   Assistants: none  Anesthesia: General endotracheal anesthesia and Bilateral TAP blocks  ASA Class: 2  Procedure Details  The patient was seen again in the Holding Room. The risks, benefits, complications, treatment options, and expected outcomes were discussed with the patient. The possibilities of reaction to medication, pulmonary aspiration, perforation of viscus, bleeding, recurrent infection, the need for additional procedures, and development of a complication requiring transfusion or further operation were discussed with the patient and/or family. The likelihood of success in repairing the hernia and returning the patient to their previous functional status is good.  There was concurrence with the proposed plan, and informed consent was obtained. The site of surgery was properly noted/marked. The patient was taken to the Operating Room, identified as Michael [name], and the procedure verified as bilateral inguinal hernia repair. A Time Out was held and the above information confirmed.  The patient was placed in the supine position and underwent induction of anesthesia. The lower abdomen and groin was prepped with Chloraprep and draped in the standard fashion.  We began on the patient's left side.  0.25% Marcaine with epinephrine was used to anesthetize the skin over the mid-portion of the inguinal canal. An oblique incision was made. Dissection was carried down through the subcutaneous tissue with cautery to the external oblique fascia.  We opened the external oblique fascia along the direction of its fibers to the external ring.  We encountered a very large direct hernia sac.  The spermatic cord was circumferentially dissected bluntly and  retracted with a Penrose drain.   The floor of the inguinal canal was inspected and had a very large direct hernia defect.  The hernia sac was dissected down to the preperitoneal space and was reduced.  The floor of the inguinal canal was reapproximated with 0 Vicryl.  We skeletonized the spermatic cord and a small cord lipoma was reduced.  We used a left-sided Progrip mesh which was inserted and deployed across the floor of the inguinal canal. The mesh was tucked underneath the external oblique fascia laterally.  The flap of the mesh was closed around the spermatic cord to recreate the internal inguinal ring.  The mesh was secured to the pubic tubercle with 0 Vicryl.  The external oblique fascia was reapproximated with 2-0 Vicryl.  3-0 Vicryl was used to close the subcutaneous tissues and 4-0 Monocryl was used to close the skin in subcuticular fashion.   We then turned our attention to the right side.  0.25% Marcaine with epinephrine was used to anesthetize the skin over the mid-portion of the inguinal canal. An oblique incision was made. Dissection was carried down through the subcutaneous tissue with cautery to the external oblique fascia.  We opened the external oblique fascia along the direction of its fibers to the external ring.  We encountered another very large direct hernia sac.  The spermatic cord was circumferentially dissected bluntly and retracted with a Penrose drain.   The floor of the inguinal canal was inspected and had a very large direct hernia defect.  The hernia sac was dissected down to the preperitoneal space and was reduced.  The floor of the inguinal canal was closed with 0 Vicryl We skeletonized the spermatic cord and a small cord lipoma was reduced.  We used a right-sided Progrip  mesh which was inserted and deployed across the floor of the inguinal canal. The mesh was tucked underneath the external oblique fascia laterally.  The flap of the mesh was closed around the spermatic cord to  recreate the internal inguinal ring.  The mesh was secured to the pubic tubercle with 0 Vicryl.  The external oblique fascia was reapproximated with 2-0 Vicryl.  3-0 Vicryl was used to close the subcutaneous tissues and 4-0 Monocryl was used to close the skin in subcuticular fashion.    Benzoin and steri-strips were used to seal the incisions.  Clean dressings was applied.  The patient was then extubated and brought to the recovery room in stable condition.  All sponge, instrument, and needle counts were correct prior to closure and at the conclusion of the case.   Estimated Blood Loss: Minimal                 Complications: None; patient tolerated the procedure well.         Disposition: PACU - hemodynamically stable.         Condition: stable   Imogene Burn. Georgette Dover, MD, [facility]  05/21/2014 2:58 PM

## 2014-05-21 NOTE — Discharge Instructions (Signed)
[facility], [place]   INGUINAL HERNIA REPAIR: POST OP INSTRUCTIONS  Always review your discharge instruction sheet given to you by the facility where your surgery was performed. IF YOU HAVE DISABILITY OR FAMILY LEAVE FORMS, YOU MUST BRING THEM TO THE OFFICE FOR PROCESSING.   DO NOT GIVE THEM TO YOUR DOCTOR.  1. A  prescription for pain medication may be given to you upon discharge.  Take your pain medication as prescribed, if needed.  If narcotic pain medicine is not needed, then you may take acetaminophen (Tylenol) or ibuprofen (Advil) as needed. 2. Take your usually prescribed medications unless otherwise directed. 3. If you need a refill on your pain medication, please contact your pharmacy.  They will contact our office to request authorization. Prescriptions will not be filled after 5 pm or on week-ends. 4. You should follow a light diet the first 24 hours after arrival home, such as soup and crackers, etc.  Be sure to include lots of fluids daily.  Resume your normal diet the day after surgery. 5. Most patients will experience some swelling and bruising around the umbilicus or in the groin and scrotum.  Ice packs and reclining will help.  Swelling and bruising can take several days to resolve.  6. It is common to experience some constipation if taking pain medication after surgery.  Increasing fluid intake and taking a stool softener (such as Colace) will usually help or prevent this problem from occurring.  A mild laxative (Milk of Magnesia or Miralax) should be taken according to package directions if there are no bowel movements after 48 hours. 7. Unless discharge instructions indicate otherwise, you may remove your bandages 24-48 hours after surgery, and you may shower at that time.  You will have steri-strips (small skin tapes) in place directly over the incision.  These strips should be left on the skin for 7-10 days. 8. ACTIVITIES:  You may resume regular (light) daily activities  beginning the next day--such as daily self-care, walking, climbing stairs--gradually increasing activities as tolerated.  You may have sexual intercourse when it is comfortable.  Refrain from any heavy lifting or straining until approved by your doctor. a. You may drive when you are no longer taking prescription pain medication, you can comfortably wear a seatbelt, and you can safely maneuver your car and apply brakes. b. RETURN TO WORK:  2-3 weeks with light duty - no lifting over 15 lbs. 9. You should see your doctor in the office for a follow-up appointment approximately 2-3 weeks after your surgery.  Make sure that you call for this appointment within a day or two after you arrive home to insure a convenient appointment time. 10. OTHER INSTRUCTIONS:  __________________________________________________________________________________________________________________________________________________________________________________________  WHEN TO CALL YOUR DOCTOR: 1. Fever over 101.0 2. Inability to urinate 3. Nausea and/or vomiting 4. Extreme swelling or bruising 5. Continued bleeding from incision. 6. Increased pain, redness, or drainage from the incision  The clinic staff is available to answer your questions during regular business hours.  Please dont hesitate to call and ask to speak to one of the nurses for clinical concerns.  If you have a medical emergency, go to the nearest emergency room or call 911.  A surgeon from [facility] is always on call at the hospital   [address], [place], [place], [place]  [address] ?  [address], [place], [place]   [address] [phone]    FAX [phone] Web site:  [email or web address]   Post Anesthesia Home Care Instructions  Activity: Get plenty of rest for the remainder of the day. A responsible adult should stay with you for 24 hours following the procedure.  For the next 24 hours, DO  NOT: -Drive a car -Paediatric nurse -Drink alcoholic beverages -Take any medication unless instructed by your physician -Make any legal decisions or sign important papers.  Meals: Start with liquid foods such as gelatin or soup. Progress to regular foods as tolerated. Avoid greasy, spicy, heavy foods. If nausea and/or vomiting occur, drink only clear liquids until the nausea and/or vomiting subsides. Call your physician if vomiting continues.  Special Instructions/Symptoms: Your throat may feel dry or sore from the anesthesia or the breathing tube placed in your throat during surgery. If this causes discomfort, gargle with warm salt water. The discomfort should disappear within 24 hours.  If you had a scopolamine patch placed behind your ear for the management of post- operative nausea and/or vomiting:  1. The medication in the patch is effective for 72 hours, after which it should be removed.  Wrap patch in a tissue and discard in the trash. Wash hands thoroughly with soap and water. 2. You may remove the patch earlier than 72 hours if you experience unpleasant side effects which may include dry mouth, dizziness or visual disturbances. 3. Avoid touching the patch. Wash your hands with soap and water after contact with the patch.

## 2014-05-23 ENCOUNTER — Encounter (HOSPITAL_BASED_OUTPATIENT_CLINIC_OR_DEPARTMENT_OTHER): Payer: Self-pay | Admitting: Surgery

## 2014-12-29 ENCOUNTER — Encounter (INDEPENDENT_AMBULATORY_CARE_PROVIDER_SITE_OTHER): Payer: PPO | Admitting: Ophthalmology

## 2014-12-29 DIAGNOSIS — H59031 Cystoid macular edema following cataract surgery, right eye: Secondary | ICD-10-CM | POA: Diagnosis not present

## 2014-12-29 DIAGNOSIS — H43813 Vitreous degeneration, bilateral: Secondary | ICD-10-CM

## 2015-01-21 ENCOUNTER — Encounter (INDEPENDENT_AMBULATORY_CARE_PROVIDER_SITE_OTHER): Payer: PPO | Admitting: Ophthalmology

## 2015-01-21 DIAGNOSIS — K222 Esophageal obstruction: Secondary | ICD-10-CM | POA: Diagnosis not present

## 2015-01-21 DIAGNOSIS — Z0001 Encounter for general adult medical examination with abnormal findings: Secondary | ICD-10-CM | POA: Diagnosis not present

## 2015-01-21 DIAGNOSIS — K5000 Crohn's disease of small intestine without complications: Secondary | ICD-10-CM | POA: Diagnosis not present

## 2015-01-21 DIAGNOSIS — H43813 Vitreous degeneration, bilateral: Secondary | ICD-10-CM | POA: Diagnosis not present

## 2015-01-21 DIAGNOSIS — E538 Deficiency of other specified B group vitamins: Secondary | ICD-10-CM | POA: Diagnosis not present

## 2015-01-21 DIAGNOSIS — H59031 Cystoid macular edema following cataract surgery, right eye: Secondary | ICD-10-CM

## 2015-01-21 DIAGNOSIS — Z0000 Encounter for general adult medical examination without abnormal findings: Secondary | ICD-10-CM | POA: Diagnosis not present

## 2015-01-21 DIAGNOSIS — H409 Unspecified glaucoma: Secondary | ICD-10-CM | POA: Diagnosis not present

## 2015-01-21 DIAGNOSIS — K4090 Unilateral inguinal hernia, without obstruction or gangrene, not specified as recurrent: Secondary | ICD-10-CM | POA: Diagnosis not present

## 2015-01-27 DIAGNOSIS — R21 Rash and other nonspecific skin eruption: Secondary | ICD-10-CM | POA: Diagnosis not present

## 2015-01-27 DIAGNOSIS — D692 Other nonthrombocytopenic purpura: Secondary | ICD-10-CM | POA: Diagnosis not present

## 2015-01-27 DIAGNOSIS — Z85828 Personal history of other malignant neoplasm of skin: Secondary | ICD-10-CM | POA: Diagnosis not present

## 2015-02-17 DIAGNOSIS — Z85828 Personal history of other malignant neoplasm of skin: Secondary | ICD-10-CM | POA: Diagnosis not present

## 2015-02-17 DIAGNOSIS — L300 Nummular dermatitis: Secondary | ICD-10-CM | POA: Diagnosis not present

## 2015-02-26 DIAGNOSIS — H401133 Primary open-angle glaucoma, bilateral, severe stage: Secondary | ICD-10-CM | POA: Diagnosis not present

## 2015-02-26 DIAGNOSIS — Z961 Presence of intraocular lens: Secondary | ICD-10-CM | POA: Diagnosis not present

## 2015-03-03 DIAGNOSIS — Z85828 Personal history of other malignant neoplasm of skin: Secondary | ICD-10-CM | POA: Diagnosis not present

## 2015-03-03 DIAGNOSIS — L111 Transient acantholytic dermatosis [Grover]: Secondary | ICD-10-CM | POA: Diagnosis not present

## 2015-03-04 ENCOUNTER — Encounter (INDEPENDENT_AMBULATORY_CARE_PROVIDER_SITE_OTHER): Payer: PPO | Admitting: Ophthalmology

## 2015-03-04 DIAGNOSIS — H59031 Cystoid macular edema following cataract surgery, right eye: Secondary | ICD-10-CM

## 2015-03-04 DIAGNOSIS — H43813 Vitreous degeneration, bilateral: Secondary | ICD-10-CM

## 2015-03-18 DIAGNOSIS — Z85828 Personal history of other malignant neoplasm of skin: Secondary | ICD-10-CM | POA: Diagnosis not present

## 2015-03-18 DIAGNOSIS — L111 Transient acantholytic dermatosis [Grover]: Secondary | ICD-10-CM | POA: Diagnosis not present

## 2015-03-18 DIAGNOSIS — L249 Irritant contact dermatitis, unspecified cause: Secondary | ICD-10-CM | POA: Diagnosis not present

## 2015-03-18 DIAGNOSIS — L820 Inflamed seborrheic keratosis: Secondary | ICD-10-CM | POA: Diagnosis not present

## 2015-03-18 DIAGNOSIS — L2089 Other atopic dermatitis: Secondary | ICD-10-CM | POA: Diagnosis not present

## 2015-03-31 DIAGNOSIS — Z961 Presence of intraocular lens: Secondary | ICD-10-CM | POA: Diagnosis not present

## 2015-03-31 DIAGNOSIS — H401133 Primary open-angle glaucoma, bilateral, severe stage: Secondary | ICD-10-CM | POA: Diagnosis not present

## 2015-04-06 ENCOUNTER — Other Ambulatory Visit (HOSPITAL_COMMUNITY): Payer: Self-pay | Admitting: Internal Medicine

## 2015-04-06 ENCOUNTER — Other Ambulatory Visit: Payer: Self-pay | Admitting: Internal Medicine

## 2015-04-06 ENCOUNTER — Ambulatory Visit (HOSPITAL_COMMUNITY)
Admission: RE | Admit: 2015-04-06 | Discharge: 2015-04-06 | Disposition: A | Discharge status: Home / Self Care | Payer: PPO | Source: Ambulatory Visit | Attending: Internal Medicine | Admitting: Internal Medicine

## 2015-04-06 ENCOUNTER — Encounter (HOSPITAL_COMMUNITY): Payer: Self-pay

## 2015-04-06 DIAGNOSIS — K828 Other specified diseases of gallbladder: Secondary | ICD-10-CM | POA: Diagnosis not present

## 2015-04-06 DIAGNOSIS — D72829 Elevated white blood cell count, unspecified: Secondary | ICD-10-CM

## 2015-04-06 DIAGNOSIS — R197 Diarrhea, unspecified: Secondary | ICD-10-CM | POA: Diagnosis not present

## 2015-04-06 DIAGNOSIS — K6389 Other specified diseases of intestine: Secondary | ICD-10-CM | POA: Diagnosis not present

## 2015-04-06 MED ORDER — IOPAMIDOL (ISOVUE-300) INJECTION 61%
100.0000 mL | Freq: Once | INTRAVENOUS | Status: AC | PRN
  Administered 2015-04-06: 100 mL via INTRAVENOUS

## 2015-04-07 ENCOUNTER — Other Ambulatory Visit: Payer: PPO

## 2015-04-07 DIAGNOSIS — R933 Abnormal findings on diagnostic imaging of other parts of digestive tract: Secondary | ICD-10-CM | POA: Diagnosis not present

## 2015-04-14 DIAGNOSIS — H401133 Primary open-angle glaucoma, bilateral, severe stage: Secondary | ICD-10-CM | POA: Diagnosis not present

## 2015-04-14 DIAGNOSIS — Z961 Presence of intraocular lens: Secondary | ICD-10-CM | POA: Diagnosis not present

## 2015-04-28 DIAGNOSIS — Z85828 Personal history of other malignant neoplasm of skin: Secondary | ICD-10-CM | POA: Diagnosis not present

## 2015-04-28 DIAGNOSIS — L300 Nummular dermatitis: Secondary | ICD-10-CM | POA: Diagnosis not present

## 2015-04-30 DIAGNOSIS — H401133 Primary open-angle glaucoma, bilateral, severe stage: Secondary | ICD-10-CM | POA: Diagnosis not present

## 2015-04-30 DIAGNOSIS — Z961 Presence of intraocular lens: Secondary | ICD-10-CM | POA: Diagnosis not present

## 2015-05-01 ENCOUNTER — Encounter (INDEPENDENT_AMBULATORY_CARE_PROVIDER_SITE_OTHER): Payer: PPO | Admitting: Ophthalmology

## 2015-05-01 DIAGNOSIS — H43813 Vitreous degeneration, bilateral: Secondary | ICD-10-CM

## 2015-05-01 DIAGNOSIS — H34822 Venous engorgement, left eye: Secondary | ICD-10-CM | POA: Diagnosis not present

## 2015-05-01 DIAGNOSIS — H59031 Cystoid macular edema following cataract surgery, right eye: Secondary | ICD-10-CM

## 2015-05-01 DIAGNOSIS — H353111 Nonexudative age-related macular degeneration, right eye, early dry stage: Secondary | ICD-10-CM

## 2015-05-07 ENCOUNTER — Encounter (INDEPENDENT_AMBULATORY_CARE_PROVIDER_SITE_OTHER): Payer: PPO | Admitting: Ophthalmology

## 2015-05-08 DIAGNOSIS — J329 Chronic sinusitis, unspecified: Secondary | ICD-10-CM | POA: Diagnosis not present

## 2015-05-08 DIAGNOSIS — Z9049 Acquired absence of other specified parts of digestive tract: Secondary | ICD-10-CM | POA: Diagnosis not present

## 2015-05-08 DIAGNOSIS — Z9842 Cataract extraction status, left eye: Secondary | ICD-10-CM | POA: Diagnosis not present

## 2015-05-08 DIAGNOSIS — J189 Pneumonia, unspecified organism: Secondary | ICD-10-CM | POA: Diagnosis not present

## 2015-05-08 DIAGNOSIS — R410 Disorientation, unspecified: Secondary | ICD-10-CM | POA: Diagnosis not present

## 2015-05-08 DIAGNOSIS — Z87891 Personal history of nicotine dependence: Secondary | ICD-10-CM | POA: Diagnosis not present

## 2015-05-08 DIAGNOSIS — Z8249 Family history of ischemic heart disease and other diseases of the circulatory system: Secondary | ICD-10-CM | POA: Diagnosis not present

## 2015-05-08 DIAGNOSIS — K50911 Crohn's disease, unspecified, with rectal bleeding: Secondary | ICD-10-CM | POA: Diagnosis not present

## 2015-05-08 DIAGNOSIS — J3489 Other specified disorders of nose and nasal sinuses: Secondary | ICD-10-CM | POA: Diagnosis not present

## 2015-05-08 DIAGNOSIS — D72829 Elevated white blood cell count, unspecified: Secondary | ICD-10-CM | POA: Diagnosis not present

## 2015-05-08 DIAGNOSIS — R000 Tachycardia, unspecified: Secondary | ICD-10-CM | POA: Diagnosis not present

## 2015-05-08 DIAGNOSIS — Z9841 Cataract extraction status, right eye: Secondary | ICD-10-CM | POA: Diagnosis not present

## 2015-05-08 DIAGNOSIS — H9193 Unspecified hearing loss, bilateral: Secondary | ICD-10-CM | POA: Diagnosis not present

## 2015-05-08 DIAGNOSIS — Z823 Family history of stroke: Secondary | ICD-10-CM | POA: Diagnosis not present

## 2015-05-08 DIAGNOSIS — I6782 Cerebral ischemia: Secondary | ICD-10-CM | POA: Diagnosis not present

## 2015-05-08 DIAGNOSIS — H409 Unspecified glaucoma: Secondary | ICD-10-CM | POA: Diagnosis not present

## 2015-05-08 DIAGNOSIS — K219 Gastro-esophageal reflux disease without esophagitis: Secondary | ICD-10-CM | POA: Diagnosis not present

## 2015-05-08 DIAGNOSIS — R4182 Altered mental status, unspecified: Secondary | ICD-10-CM | POA: Diagnosis not present

## 2015-05-08 DIAGNOSIS — R918 Other nonspecific abnormal finding of lung field: Secondary | ICD-10-CM | POA: Diagnosis not present

## 2015-05-09 DIAGNOSIS — J40 Bronchitis, not specified as acute or chronic: Secondary | ICD-10-CM | POA: Diagnosis not present

## 2015-05-09 DIAGNOSIS — J189 Pneumonia, unspecified organism: Secondary | ICD-10-CM | POA: Diagnosis not present

## 2015-05-09 DIAGNOSIS — R4182 Altered mental status, unspecified: Secondary | ICD-10-CM | POA: Diagnosis not present

## 2015-05-14 DIAGNOSIS — J189 Pneumonia, unspecified organism: Secondary | ICD-10-CM | POA: Diagnosis not present

## 2015-05-22 DIAGNOSIS — R5383 Other fatigue: Secondary | ICD-10-CM | POA: Diagnosis not present

## 2015-05-22 DIAGNOSIS — J189 Pneumonia, unspecified organism: Secondary | ICD-10-CM | POA: Diagnosis not present

## 2015-05-28 DIAGNOSIS — Z961 Presence of intraocular lens: Secondary | ICD-10-CM | POA: Diagnosis not present

## 2015-05-28 DIAGNOSIS — H401133 Primary open-angle glaucoma, bilateral, severe stage: Secondary | ICD-10-CM | POA: Diagnosis not present

## 2015-06-05 DIAGNOSIS — H6121 Impacted cerumen, right ear: Secondary | ICD-10-CM | POA: Diagnosis not present

## 2015-06-15 ENCOUNTER — Other Ambulatory Visit: Payer: Self-pay | Admitting: Gastroenterology

## 2015-06-15 ENCOUNTER — Ambulatory Visit
Admission: RE | Admit: 2015-06-15 | Discharge: 2015-06-15 | Disposition: A | Discharge status: Home / Self Care | Payer: PPO | Source: Ambulatory Visit | Attending: Gastroenterology | Admitting: Gastroenterology

## 2015-06-15 DIAGNOSIS — J189 Pneumonia, unspecified organism: Secondary | ICD-10-CM

## 2015-06-17 DIAGNOSIS — Z85828 Personal history of other malignant neoplasm of skin: Secondary | ICD-10-CM | POA: Diagnosis not present

## 2015-06-17 DIAGNOSIS — L300 Nummular dermatitis: Secondary | ICD-10-CM | POA: Diagnosis not present

## 2015-06-17 DIAGNOSIS — L111 Transient acantholytic dermatosis [Grover]: Secondary | ICD-10-CM | POA: Diagnosis not present

## 2015-06-17 DIAGNOSIS — L821 Other seborrheic keratosis: Secondary | ICD-10-CM | POA: Diagnosis not present

## 2015-07-02 ENCOUNTER — Encounter (INDEPENDENT_AMBULATORY_CARE_PROVIDER_SITE_OTHER): Payer: PPO | Admitting: Ophthalmology

## 2015-07-02 DIAGNOSIS — H43822 Vitreomacular adhesion, left eye: Secondary | ICD-10-CM

## 2015-07-02 DIAGNOSIS — H43813 Vitreous degeneration, bilateral: Secondary | ICD-10-CM | POA: Diagnosis not present

## 2015-07-02 DIAGNOSIS — H59031 Cystoid macular edema following cataract surgery, right eye: Secondary | ICD-10-CM | POA: Diagnosis not present

## 2015-07-24 ENCOUNTER — Encounter (INDEPENDENT_AMBULATORY_CARE_PROVIDER_SITE_OTHER): Payer: PPO | Admitting: Ophthalmology

## 2015-07-24 DIAGNOSIS — H43813 Vitreous degeneration, bilateral: Secondary | ICD-10-CM | POA: Diagnosis not present

## 2015-07-24 DIAGNOSIS — H59031 Cystoid macular edema following cataract surgery, right eye: Secondary | ICD-10-CM | POA: Diagnosis not present

## 2015-08-05 DIAGNOSIS — L309 Dermatitis, unspecified: Secondary | ICD-10-CM | POA: Diagnosis not present

## 2015-08-05 DIAGNOSIS — Z961 Presence of intraocular lens: Secondary | ICD-10-CM | POA: Diagnosis not present

## 2015-08-05 DIAGNOSIS — H401133 Primary open-angle glaucoma, bilateral, severe stage: Secondary | ICD-10-CM | POA: Diagnosis not present

## 2015-08-05 DIAGNOSIS — L111 Transient acantholytic dermatosis [Grover]: Secondary | ICD-10-CM | POA: Diagnosis not present

## 2015-08-05 DIAGNOSIS — D485 Neoplasm of uncertain behavior of skin: Secondary | ICD-10-CM | POA: Diagnosis not present

## 2015-08-05 DIAGNOSIS — C44212 Basal cell carcinoma of skin of right ear and external auricular canal: Secondary | ICD-10-CM | POA: Diagnosis not present

## 2015-08-05 DIAGNOSIS — Z85828 Personal history of other malignant neoplasm of skin: Secondary | ICD-10-CM | POA: Diagnosis not present

## 2015-08-05 DIAGNOSIS — L821 Other seborrheic keratosis: Secondary | ICD-10-CM | POA: Diagnosis not present

## 2015-08-17 ENCOUNTER — Other Ambulatory Visit: Payer: Self-pay | Admitting: *Deleted

## 2015-08-17 DIAGNOSIS — I6523 Occlusion and stenosis of bilateral carotid arteries: Secondary | ICD-10-CM

## 2015-08-18 ENCOUNTER — Ambulatory Visit (HOSPITAL_COMMUNITY)
Admission: RE | Admit: 2015-08-18 | Discharge: 2015-08-18 | Disposition: A | Discharge status: Home / Self Care | Payer: PPO | Source: Ambulatory Visit | Attending: Vascular Surgery | Admitting: Vascular Surgery

## 2015-08-18 DIAGNOSIS — I6523 Occlusion and stenosis of bilateral carotid arteries: Secondary | ICD-10-CM | POA: Diagnosis not present

## 2015-08-18 LAB — VAS US CAROTID
LCCADDIAS: 16 cm/s
LEFT ECA DIAS: -10 cm/s
LICADDIAS: -38 cm/s
LICADSYS: -105 cm/s
LICAPDIAS: -15 cm/s
LICAPSYS: -54 cm/s
Left CCA dist sys: 72 cm/s
Left CCA prox dias: 26 cm/s
Left CCA prox sys: 102 cm/s
RIGHT CCA MID DIAS: 18 cm/s
RIGHT ECA DIAS: -13 cm/s
Right CCA prox dias: 15 cm/s
Right CCA prox sys: 84 cm/s
Right cca dist sys: -92 cm/s

## 2015-08-19 ENCOUNTER — Encounter: Payer: Self-pay | Admitting: Vascular Surgery

## 2015-08-20 ENCOUNTER — Encounter: Payer: Self-pay | Admitting: Vascular Surgery

## 2015-08-20 ENCOUNTER — Ambulatory Visit (INDEPENDENT_AMBULATORY_CARE_PROVIDER_SITE_OTHER): Payer: PPO | Admitting: Vascular Surgery

## 2015-08-20 VITALS — BP 156/92 | HR 68 | Temp 96.9°F | Resp 18 | Ht 71.0 in | Wt 181.8 lb

## 2015-08-20 DIAGNOSIS — H3582 Retinal ischemia: Secondary | ICD-10-CM | POA: Diagnosis not present

## 2015-08-20 NOTE — Progress Notes (Signed)
Patient name: Michael [name] MRN: [id number] DOB: 17-Feb-1932 Sex: male  REASON FOR CONSULT: Rule out carotid cause for retinal ischemia right eye  HPI: Michael [name] is a 82 y.o. male, seen today for a carotid evaluation. He is been seen by Dr. 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 He has been treated with some injections and has had some improvement from this. He did undergo carotid duplex in our office in 2013 for similar difficulty and at that time had no significant disease. He specifically denies any episodes of amaurosis fugax, transient ischemic attack or stroke. He is otherwise healthy and quite active and plays tennis regularly.  Past Medical History:  Diagnosis Date  . Anemia   . Crohn's disease ([place])   . Glaucoma   . Inguinal hernia recurrent bilateral     Family History  Problem Relation Age of Onset  . Deep vein thrombosis Mother   . Heart disease Father     SOCIAL HISTORY: Social History   Social History  . Marital status: Married    Spouse name: N/A  . Number of children: N/A  . Years of education: N/A   Occupational History  . Not on file.   Social History Main Topics  . Smoking status: Former Smoker    Quit date: 04/01/1976  . Smokeless tobacco: Never Used  . Alcohol use Yes     Comment: 2-3  glass wine  weekly  1-2 cans beer/ week  . Drug use: No  . Sexual activity: No   Other Topics Concern  . Not on file   Social History Narrative  . No narrative on file    No Known Allergies  Current Outpatient Prescriptions  Medication Sig Dispense Refill  . Cyanocobalamin (VITAMIN B-12 IJ) Inject as directed.    . IRON PO Take 1 tablet by mouth daily.    Marland Kitchen latanoprost (XALATAN) 0.005 % ophthalmic solution Place 1 drop into both eyes at bedtime.    Marland Kitchen HYDROcodone-acetaminophen (NORCO/VICODIN) 5-325 MG per tablet Take 1 tablet by mouth every 4 (four) hours as needed. (Patient not taking: Reported on 08/20/2015) 40  tablet 0   No current facility-administered medications for this visit.     REVIEW OF SYSTEMS:  [X]  denotes positive finding, [ ]  denotes negative finding Cardiac  Comments:  Chest pain or chest pressure:    Shortness of breath upon exertion:    Short of breath when lying flat:    Irregular heart rhythm:        Vascular    Pain in calf, thigh, or hip brought on by ambulation:    Pain in feet at night that wakes you up from your sleep:     Blood clot in your veins:    Leg swelling:         Pulmonary    Oxygen at home:    Productive cough:     Wheezing:         Neurologic    Sudden weakness in arms or legs:     Sudden numbness in arms or legs:     Sudden onset of difficulty speaking or slurred speech:    Temporary loss of vision in one eye:     Problems with dizziness:         Gastrointestinal    Blood in stool:     Vomited blood:         Genitourinary    Burning when  urinating:     Blood in urine:        Psychiatric    Major depression:         Hematologic    Bleeding problems:    Problems with blood clotting too easily:        Skin    Rashes or ulcers:        Constitutional    Fever or chills:      PHYSICAL EXAM: Vitals:   08/20/15 1110 08/20/15 1113  BP: (!) 168/88 (!) 156/92  Pulse: 68   Resp: 18   Temp: (!) 96.9 F (36.1 C)   TempSrc: Oral   SpO2: 100%   Weight: 181 lb 12.8 oz (82.5 kg)   Height: 5' 11"  (1.803 m)     GENERAL: The patient is a well-nourished male, in no acute distress. The vital signs are documented above. CARDIAC: There is a regular rate and rhythm.  VASCULAR: 2+ radial and 2+ dorsalis pedis pulses bilaterally PULMONARY: There is good air exchange bilaterally without wheezing or rales. MUSCULOSKELETAL: There are no major deformities or cyanosis. NEUROLOGIC: No focal weakness or paresthesias are detected. SKIN: There are no ulcers or rashes noted. Does have some scattered telangiectasia over both lower  extremities PSYCHIATRIC: The patient has a normal affect.  DATA:   Carotid duplex today is similar to that from 2013. His wife patency of his carotid arteries bilaterally no evidence of hemodynamic stenosis.  MEDICAL ISSUES:  No evidence of extrinsic occlusive disease. Discussed this at length with patient. He will continue his follow-up with Dr. Zigmund Daniel and see [place] on an as-needed basis   Rickie Gange [facility] [employer] [phone]

## 2015-08-20 NOTE — Progress Notes (Signed)
Vitals:   08/20/15 1110  BP: (!) 168/88  Pulse: 68  Resp: 18  Temp: (!) 96.9 F (36.1 C)  TempSrc: Oral  SpO2: 100%  Weight: 181 lb 12.8 oz (82.5 kg)  Height: 5' 11"  (1.803 m)

## 2015-08-21 ENCOUNTER — Encounter (INDEPENDENT_AMBULATORY_CARE_PROVIDER_SITE_OTHER): Payer: PPO | Admitting: Ophthalmology

## 2015-08-21 DIAGNOSIS — H43813 Vitreous degeneration, bilateral: Secondary | ICD-10-CM | POA: Diagnosis not present

## 2015-08-21 DIAGNOSIS — H59031 Cystoid macular edema following cataract surgery, right eye: Secondary | ICD-10-CM | POA: Diagnosis not present

## 2015-08-27 DIAGNOSIS — Z85828 Personal history of other malignant neoplasm of skin: Secondary | ICD-10-CM | POA: Diagnosis not present

## 2015-08-27 DIAGNOSIS — C44212 Basal cell carcinoma of skin of right ear and external auricular canal: Secondary | ICD-10-CM | POA: Diagnosis not present

## 2015-09-10 ENCOUNTER — Encounter: Payer: Self-pay | Admitting: Ophthalmology

## 2015-10-05 ENCOUNTER — Encounter (INDEPENDENT_AMBULATORY_CARE_PROVIDER_SITE_OTHER): Payer: PPO | Admitting: Ophthalmology

## 2015-10-05 DIAGNOSIS — H59031 Cystoid macular edema following cataract surgery, right eye: Secondary | ICD-10-CM | POA: Diagnosis not present

## 2015-10-05 DIAGNOSIS — H43813 Vitreous degeneration, bilateral: Secondary | ICD-10-CM

## 2015-10-05 DIAGNOSIS — H353111 Nonexudative age-related macular degeneration, right eye, early dry stage: Secondary | ICD-10-CM

## 2015-11-02 DIAGNOSIS — L565 Disseminated superficial actinic porokeratosis (DSAP): Secondary | ICD-10-CM | POA: Diagnosis not present

## 2015-11-02 DIAGNOSIS — L820 Inflamed seborrheic keratosis: Secondary | ICD-10-CM | POA: Diagnosis not present

## 2015-11-02 DIAGNOSIS — Z85828 Personal history of other malignant neoplasm of skin: Secondary | ICD-10-CM | POA: Diagnosis not present

## 2015-11-02 DIAGNOSIS — L309 Dermatitis, unspecified: Secondary | ICD-10-CM | POA: Diagnosis not present

## 2015-11-02 DIAGNOSIS — L821 Other seborrheic keratosis: Secondary | ICD-10-CM | POA: Diagnosis not present

## 2015-11-05 DIAGNOSIS — H401133 Primary open-angle glaucoma, bilateral, severe stage: Secondary | ICD-10-CM | POA: Diagnosis not present

## 2015-11-06 DIAGNOSIS — R351 Nocturia: Secondary | ICD-10-CM | POA: Diagnosis not present

## 2015-11-06 DIAGNOSIS — N390 Urinary tract infection, site not specified: Secondary | ICD-10-CM | POA: Diagnosis not present

## 2015-11-06 DIAGNOSIS — Z125 Encounter for screening for malignant neoplasm of prostate: Secondary | ICD-10-CM | POA: Diagnosis not present

## 2015-11-10 DIAGNOSIS — R05 Cough: Secondary | ICD-10-CM | POA: Diagnosis not present

## 2015-11-10 DIAGNOSIS — R0982 Postnasal drip: Secondary | ICD-10-CM | POA: Diagnosis not present

## 2015-11-15 ENCOUNTER — Emergency Department (HOSPITAL_BASED_OUTPATIENT_CLINIC_OR_DEPARTMENT_OTHER)
Admission: EM | Admit: 2015-11-15 | Discharge: 2015-11-15 | Disposition: A | Discharge status: Home / Self Care | Payer: PPO | Attending: Emergency Medicine | Admitting: Emergency Medicine

## 2015-11-15 ENCOUNTER — Encounter (HOSPITAL_BASED_OUTPATIENT_CLINIC_OR_DEPARTMENT_OTHER): Payer: Self-pay | Admitting: Emergency Medicine

## 2015-11-15 ENCOUNTER — Emergency Department (HOSPITAL_BASED_OUTPATIENT_CLINIC_OR_DEPARTMENT_OTHER): Payer: PPO

## 2015-11-15 DIAGNOSIS — H7291 Unspecified perforation of tympanic membrane, right ear: Secondary | ICD-10-CM

## 2015-11-15 DIAGNOSIS — J069 Acute upper respiratory infection, unspecified: Secondary | ICD-10-CM | POA: Diagnosis not present

## 2015-11-15 DIAGNOSIS — H65192 Other acute nonsuppurative otitis media, left ear: Secondary | ICD-10-CM | POA: Diagnosis not present

## 2015-11-15 DIAGNOSIS — R05 Cough: Secondary | ICD-10-CM | POA: Diagnosis not present

## 2015-11-15 DIAGNOSIS — Z87891 Personal history of nicotine dependence: Secondary | ICD-10-CM | POA: Insufficient documentation

## 2015-11-15 MED ORDER — DEXAMETHASONE 6 MG PO TABS
10.0000 mg | ORAL_TABLET | Freq: Once | ORAL | Status: AC
  Administered 2015-11-15: 10 mg via ORAL
  Filled 2015-11-15: qty 1

## 2015-11-15 NOTE — Discharge Instructions (Signed)
[facility]

## 2015-11-15 NOTE — ED Provider Notes (Signed)
[facility] Provider Note   CSN: [id number] Arrival date & time: 11/15/15  1543  By signing my name below, I, Michael [name], attest that this documentation has been prepared under the direction and in the presence of Michael Blank, MD.  Electronically Signed: Julien [name], ED Scribe. 11/15/15. 4:28 PM.    History   Chief Complaint Chief Complaint  Patient presents with  . Cough    The history is provided by the patient. No language interpreter was used.   HPI Comments: Michael [name] is a 82 y.o. male who has a PMhx of anemia, Crohn's disease, and glaucoma presents to the Emergency Department complaining of constant, gradual worsening, moderate "hacking" cough x 1 week. He has been having associated facial congestion, sore throat x 3 days and left ear pain when swallowing x 2 days. Pt was seen by his PCP two days ago and was prescribed advair and liquid cough syrup. Pt says his symptoms have not gotten any better with what he was prescribed. He has not taken any OTC medication to alleviate his symptoms. Family member notes that pt has a hx of pneumonia in April 2017. Denies fever, chills, congestion, nausea, vomiting, chest pain, shortness of breath, abdominal pain.    PCP: Michael Fair, MD  Past Medical History:  Diagnosis Date  . Anemia   . Crohn's disease (Michael [name])   . Glaucoma   . Inguinal hernia recurrent bilateral     Patient Active Problem List   Diagnosis Date Noted  . 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 ABDOMINAL SURGERY    . INGUINAL HERNIA REPAIR Bilateral 05/21/2014   Procedure: OPEN BILATERAL INGUINAL HERNIA REPAIRS WITH MESH;  Surgeon: Michael Mesa, MD;  Location: [facility];  Service: General;  Laterality: Bilateral;  . SMALL INTESTINE SURGERY  1968, 2001    related to Chrohn's disease  . TONSILLECTOMY         Home Medications    Prior to Admission medications     Medication Sig Start Date End Date Taking? Authorizing Provider  Cyanocobalamin (VITAMIN B-12 IJ) Inject as directed.    Historical Provider, MD  HYDROcodone-acetaminophen (NORCO/VICODIN) 5-325 MG per tablet Take 1 tablet by mouth every 4 (four) hours as needed. Patient not taking: Reported on 08/20/2015 05/21/14   Michael Mesa, MD  IRON PO Take 1 tablet by mouth daily.    Historical Provider, MD  latanoprost (XALATAN) 0.005 % ophthalmic solution Place 1 drop into both eyes at bedtime.    Historical Provider, MD    Family History Family History  Problem Relation Age of Onset  . Deep vein thrombosis Mother   . Heart disease Father     Social History Social History  Substance Use Topics  . Smoking status: Former Smoker    Quit date: 04/01/1976  . Smokeless tobacco: Never Used  . Alcohol use Yes     Comment: 2-3  glass wine  weekly  1-2 cans beer/ week     Allergies   Patient has no known allergies.   Review of Systems Review of Systems  All other systems reviewed and are negative.   A complete 10 system review of systems was obtained and all systems are negative except as noted in the HPI and PMH.    Physical Exam Updated Vital Signs BP 167/92   Pulse 81   Temp 98 F (36.7 C)   Resp 18   Wt 182  lb (82.6 kg)   SpO2 98%   BMI 25.38 kg/m   Physical Exam  Constitutional: He is oriented to person, place, and time. He appears well-developed and well-nourished. No distress.  HENT:  Head: Normocephalic and atraumatic.  Left Ear: A middle ear effusion is present.  Nose: Nose normal.  Mouth/Throat: Mucous membranes are normal. No oropharyngeal exudate, posterior oropharyngeal edema or posterior oropharyngeal erythema.  Hole in right ear drum, no mastoid tenderness, middle ear effusion on left with no erythema. Post nasal drip with mild posterior oropharynx irritation. No PTA.  Eyes: Conjunctivae and EOM are normal. Pupils are equal, round, and reactive to light. Right eye  exhibits no discharge. Left eye exhibits no discharge. No scleral icterus.  Neck: Normal range of motion. Neck supple.  Cardiovascular: Normal rate and regular rhythm.  Exam reveals no gallop and no friction rub.   No murmur heard. Pulmonary/Chest: Effort normal and breath sounds normal. No stridor. No respiratory distress. He has no rales.  Abdominal: Soft. He exhibits no distension. There is no tenderness.  Musculoskeletal: He exhibits no edema or tenderness.  Neurological: He is alert and oriented to person, place, and time.  Skin: Skin is warm and dry. No rash noted. He is not diaphoretic. No erythema.  Psychiatric: He has a normal mood and affect.  Nursing note and vitals reviewed.    ED Treatments / Results  DIAGNOSTIC STUDIES: Oxygen Saturation is 98% on RA, normal by my interpretation.  COORDINATION OF CARE:  4:28 PM Discussed treatment plan with pt at bedside and pt agreed to plan.  Labs (all labs ordered are listed, but only abnormal results are displayed) Labs Reviewed - No data to display  EKG  EKG Interpretation None       Radiology Dg Chest 2 View  Result Date: 11/15/2015 CLINICAL DATA:  Cough. Congestion. Sore throat for 4 days. Ex-smoker. EXAM: CHEST  2 VIEW COMPARISON:  06/15/2015 FINDINGS: Moderate lower thoracic spondylosis. Midline trachea. Normal heart size. Tortuous thoracic aorta. No pleural effusion or pneumothorax. Vague increased density over the right apex is again identified, felt to relate to anterior first right rib. Clear lungs. IMPRESSION: No acute cardiopulmonary disease. Electronically Signed   By: Michael [name] M.D.   On: 11/15/2015 16:16    Procedures Procedures (including critical care time)  Medications Ordered in ED Medications  dexamethasone (DECADRON) tablet 10 mg (not administered)     Initial Impression / Assessment and Plan / ED Course  I have reviewed the triage vital signs and the nursing notes.  Pertinent labs & imaging  results that were available during my care of the patient were reviewed by me and considered in my medical decision making (see chart for details).  Clinical Course     82 y.o. male presents with cough, rhinorrhea for 5 days with sore throat and left otalgia for 2 days. adequate oral hydration. Rest of history as above.  Patient appears well. No signs of toxicity, patient is interactive and playful. No hypoxia, tachypnea or other signs of respiratory distress. No sign of clinical dehydration. Lung exam clear. Rest of exam as above.  Chest x-ray w/o PNA.  Most consistent with viral upper respiratory infection.   No evidence suggestive of pharyngitis, AOM, PNA, or meningitis. Patient is already on albuterol and cough syrup.  We'll give dose of Decadron here.  Discussed symptomatic treatment with the parents and they will follow closely with their PCP.    The patient is safe for discharge with  strict return precautions.   Final Clinical Impressions(s) / ED Diagnoses   Final diagnoses:  Acute middle ear effusion, left  Upper respiratory tract infection, unspecified type  Perforated tympanic membrane on examination, right   I personally performed the services described in this documentation, which was scribed in my presence. The recorded information has been reviewed and is accurate.   Disposition: Discharge  Condition: Good  I have discussed the results, Dx and Tx plan with the patient who expressed understanding and agree(s) with the plan. Discharge instructions discussed at great length. The patient was given strict return precautions who verbalized understanding of the instructions. No further questions at time of discharge.    Current Discharge Medication List      Follow Up: Michael Fair, MD [place] [address] [place] [address] [phone]  Schedule an appointment as soon as possible for a visit  If symptoms do not improve or  worsen        Michael Blank, MD 11/15/15 1649

## 2015-11-15 NOTE — ED Triage Notes (Signed)
Pt in c/o cough onset 1 week, was seen and given meds x 4 days ago with no relief. Hx of pneumonia. Pt alert, interactive, ambulatory in NAD.

## 2015-11-16 ENCOUNTER — Encounter (INDEPENDENT_AMBULATORY_CARE_PROVIDER_SITE_OTHER): Payer: PPO | Admitting: Ophthalmology

## 2015-11-16 DIAGNOSIS — H59031 Cystoid macular edema following cataract surgery, right eye: Secondary | ICD-10-CM

## 2015-11-16 DIAGNOSIS — H353131 Nonexudative age-related macular degeneration, bilateral, early dry stage: Secondary | ICD-10-CM

## 2015-11-16 DIAGNOSIS — H43813 Vitreous degeneration, bilateral: Secondary | ICD-10-CM | POA: Diagnosis not present

## 2015-11-18 ENCOUNTER — Ambulatory Visit
Admission: RE | Admit: 2015-11-18 | Discharge: 2015-11-18 | Disposition: A | Discharge status: Home / Self Care | Payer: PPO | Source: Ambulatory Visit | Attending: Gastroenterology | Admitting: Gastroenterology

## 2015-11-18 ENCOUNTER — Other Ambulatory Visit: Payer: Self-pay | Admitting: Gastroenterology

## 2015-11-18 DIAGNOSIS — E538 Deficiency of other specified B group vitamins: Secondary | ICD-10-CM | POA: Diagnosis not present

## 2015-11-18 DIAGNOSIS — R1084 Generalized abdominal pain: Secondary | ICD-10-CM | POA: Diagnosis not present

## 2015-11-18 DIAGNOSIS — R140 Abdominal distension (gaseous): Secondary | ICD-10-CM | POA: Diagnosis not present

## 2015-11-18 DIAGNOSIS — K222 Esophageal obstruction: Secondary | ICD-10-CM | POA: Diagnosis not present

## 2015-11-18 DIAGNOSIS — K5000 Crohn's disease of small intestine without complications: Secondary | ICD-10-CM | POA: Diagnosis not present

## 2015-11-18 DIAGNOSIS — K50019 Crohn's disease of small intestine with unspecified complications: Secondary | ICD-10-CM

## 2015-11-22 DIAGNOSIS — J069 Acute upper respiratory infection, unspecified: Secondary | ICD-10-CM | POA: Diagnosis not present

## 2015-12-11 ENCOUNTER — Observation Stay (HOSPITAL_BASED_OUTPATIENT_CLINIC_OR_DEPARTMENT_OTHER)
Admission: EM | Admit: 2015-12-11 | Discharge: 2015-12-13 | Disposition: A | Discharge status: Home / Self Care | Payer: PPO | Attending: Internal Medicine | Admitting: Internal Medicine

## 2015-12-11 ENCOUNTER — Encounter (HOSPITAL_BASED_OUTPATIENT_CLINIC_OR_DEPARTMENT_OTHER): Payer: Self-pay | Admitting: *Deleted

## 2015-12-11 DIAGNOSIS — E538 Deficiency of other specified B group vitamins: Secondary | ICD-10-CM | POA: Insufficient documentation

## 2015-12-11 DIAGNOSIS — Z980 Intestinal bypass and anastomosis status: Secondary | ICD-10-CM | POA: Insufficient documentation

## 2015-12-11 DIAGNOSIS — K5000 Crohn's disease of small intestine without complications: Principal | ICD-10-CM | POA: Insufficient documentation

## 2015-12-11 DIAGNOSIS — E1165 Type 2 diabetes mellitus with hyperglycemia: Secondary | ICD-10-CM | POA: Insufficient documentation

## 2015-12-11 DIAGNOSIS — K5090 Crohn's disease, unspecified, without complications: Secondary | ICD-10-CM

## 2015-12-11 DIAGNOSIS — Z87891 Personal history of nicotine dependence: Secondary | ICD-10-CM | POA: Insufficient documentation

## 2015-12-11 DIAGNOSIS — T380X5A Adverse effect of glucocorticoids and synthetic analogues, initial encounter: Secondary | ICD-10-CM | POA: Insufficient documentation

## 2015-12-11 DIAGNOSIS — K633 Ulcer of intestine: Secondary | ICD-10-CM | POA: Diagnosis not present

## 2015-12-11 DIAGNOSIS — H409 Unspecified glaucoma: Secondary | ICD-10-CM

## 2015-12-11 DIAGNOSIS — Z794 Long term (current) use of insulin: Secondary | ICD-10-CM | POA: Diagnosis not present

## 2015-12-11 DIAGNOSIS — K640 First degree hemorrhoids: Secondary | ICD-10-CM | POA: Diagnosis not present

## 2015-12-11 DIAGNOSIS — D509 Iron deficiency anemia, unspecified: Secondary | ICD-10-CM | POA: Insufficient documentation

## 2015-12-11 DIAGNOSIS — D649 Anemia, unspecified: Secondary | ICD-10-CM

## 2015-12-11 DIAGNOSIS — K625 Hemorrhage of anus and rectum: Secondary | ICD-10-CM | POA: Diagnosis not present

## 2015-12-11 LAB — CBC
HEMATOCRIT: 30.8 % — AB (ref 39.0–52.0)
Hemoglobin: 10.3 g/dL — ABNORMAL LOW (ref 13.0–17.0)
MCH: 32.2 pg (ref 26.0–34.0)
MCHC: 33.4 g/dL (ref 30.0–36.0)
MCV: 96.3 fL (ref 78.0–100.0)
PLATELETS: 152 10*3/uL (ref 150–400)
RBC: 3.2 MIL/uL — AB (ref 4.22–5.81)
RDW: 13.8 % (ref 11.5–15.5)
WBC: 5.5 10*3/uL (ref 4.0–10.5)

## 2015-12-11 LAB — URINALYSIS, ROUTINE W REFLEX MICROSCOPIC
Bilirubin Urine: NEGATIVE
GLUCOSE, UA: NEGATIVE mg/dL
HGB URINE DIPSTICK: NEGATIVE
Ketones, ur: NEGATIVE mg/dL
LEUKOCYTES UA: NEGATIVE
Nitrite: NEGATIVE
PH: 6.5 (ref 5.0–8.0)
PROTEIN: NEGATIVE mg/dL
SPECIFIC GRAVITY, URINE: 1.023 (ref 1.005–1.030)

## 2015-12-11 LAB — COMPREHENSIVE METABOLIC PANEL
ALT: 21 U/L (ref 17–63)
AST: 20 U/L (ref 15–41)
Albumin: 2.9 g/dL — ABNORMAL LOW (ref 3.5–5.0)
Alkaline Phosphatase: 48 U/L (ref 38–126)
Anion gap: 9 (ref 5–15)
BUN: 21 mg/dL — ABNORMAL HIGH (ref 6–20)
CHLORIDE: 105 mmol/L (ref 101–111)
CO2: 25 mmol/L (ref 22–32)
CREATININE: 0.98 mg/dL (ref 0.61–1.24)
Calcium: 8.1 mg/dL — ABNORMAL LOW (ref 8.9–10.3)
GFR calc non Af Amer: >60 mL/min (ref >60)
Glucose, Bld: 185 mg/dL — ABNORMAL HIGH (ref 65–99)
POTASSIUM: 4.1 mmol/L (ref 3.5–5.1)
SODIUM: 139 mmol/L (ref 135–145)
Total Bilirubin: 0.8 mg/dL (ref 0.3–1.2)
Total Protein: 5.9 g/dL — ABNORMAL LOW (ref 6.5–8.1)

## 2015-12-11 NOTE — ED Triage Notes (Signed)
Pt with hx of chron's. Onset of BM with dark red bleeding this afternoon, BM x 3. Pt reports he has been having indigestion through the day today on and off. Denies vomiting, dizziness or shortness of breath.

## 2015-12-11 NOTE — ED Notes (Signed)
Paged Hospitalist via [place] @ 10:53pm

## 2015-12-11 NOTE — ED Provider Notes (Signed)
[facility] DEPT [facility] Provider Note   CSN: [id number] Arrival date & time: 12/11/15  2059  By signing my name below, I, Soijett Blue, attest that this documentation has been prepared under the direction and in the presence of Orlie Dakin, MD. Electronically Signed: Soijett Blue, ED Scribe. 12/11/15. 10:43 PM.  History   Chief Complaint Chief Complaint  Patient presents with  . Rectal Bleeding    HPI Michael [name] is a 82 y.o. male with a PMHx of chron's disease, anemia, who presents to the Emergency Department complaining of rectal bleeding onset 3 PM today. Pt reports that he has had three bowel movements consistent of brown stools mixed with blood. Pt notes that his current symptoms are mildly similar to past chron's exacerbations. Pt wife reports that last month, he was seen in the ED for a chron's flare and was going to be admitted due to his symptoms, to which the pt declined. Pt states that he was then placed on high-dose prednisone in November 2017 following being seen in the ED. Wife states that they spoke with the GI specialist on-call, Dr. Oletta Lamas who informed the pt to be seen in the ED for further evaluation of his symptoms. He states that he is having associated symptoms of intermittent indigestion. Denies his indigestion being worsened with ambulation.Denies shortness of breath denies chest pain Pt denies alleviating factors for his indigestion although presently he is asymptomatic. He states that he has tried alka-seltzer with no relief for his symptoms. He denies dizziness, vomiting, nausea, lightheadedness, weakness, abdominal distention, and any other symptoms. Pt gastroenterologist is Dr. Earle Gell. Pt denies smoking cigarettes and endorses occasional ETOH use. Denies allergies to medications.    The history is provided by the patient and the spouse. No language interpreter was used.    Past Medical History:  Diagnosis Date  . Anemia   . Crohn's disease ([place])    . Glaucoma   . Inguinal hernia recurrent bilateral     Patient Active Problem List   Diagnosis Date Noted  . 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 ABDOMINAL SURGERY    . INGUINAL HERNIA REPAIR Bilateral 05/21/2014   Procedure: OPEN BILATERAL INGUINAL HERNIA REPAIRS WITH MESH;  Surgeon: Donnie Mesa, MD;  Location: [facility];  Service: General;  Laterality: Bilateral;  . SMALL INTESTINE SURGERY  1968, 2001    related to Chrohn's disease  . TONSILLECTOMY         Home Medications    Prior to Admission medications   Medication Sig Start Date End Date Taking? Authorizing Provider  Cyanocobalamin (VITAMIN B-12 IJ) Inject as directed.    Historical Provider, MD  HYDROcodone-acetaminophen (NORCO/VICODIN) 5-325 MG per tablet Take 1 tablet by mouth every 4 (four) hours as needed. Patient not taking: Reported on 08/20/2015 05/21/14   Donnie Mesa, MD  IRON PO Take 1 tablet by mouth daily.    Historical Provider, MD  latanoprost (XALATAN) 0.005 % ophthalmic solution Place 1 drop into both eyes at bedtime.    Historical Provider, MD    Family History Family History  Problem Relation Age of Onset  . Deep vein thrombosis Mother   . Heart disease Father     Social History Social History  Substance Use Topics  . Smoking status: Former Smoker    Quit date: 04/01/1976  . Smokeless tobacco: Never Used  . Alcohol use Yes     Comment: 2-3  glass wine  weekly  1-2 cans beer/ week     Allergies   Patient has no known allergies.   Review of Systems Review of Systems  Constitutional: Negative.   HENT: Negative.   Respiratory: Negative.   Cardiovascular: Negative.   Gastrointestinal: Positive for hematochezia.  Musculoskeletal: Negative.   Skin: Negative.   Neurological: Negative.   Psychiatric/Behavioral: Negative.   All other systems reviewed and are negative.    Physical Exam Updated Vital Signs BP 142/79  (BP Location: Right Arm)   Pulse 94   Temp 98 F (36.7 C) (Oral)   Resp 18   Ht 5' 11"  (1.803 m)   Wt 180 lb (81.6 kg)   SpO2 97%   BMI 25.10 kg/m   Physical Exam  Constitutional: He appears well-developed and well-nourished.  HENT:  Head: Normocephalic and atraumatic.  Eyes: Conjunctivae are normal. Pupils are equal, round, and reactive to light.  Neck: Neck supple. No tracheal deviation present. No thyromegaly present.  Cardiovascular: Normal rate and regular rhythm.   No murmur heard. Pulmonary/Chest: Effort normal and breath sounds normal.  Abdominal: Soft. Bowel sounds are normal. He exhibits no distension. There is no tenderness.  Multiple surgical scars  Genitourinary:  Genitourinary Comments: Rectal normal tone maroon stool, gross blood  Musculoskeletal: Normal range of motion. He exhibits no edema or tenderness.  Neurological: He is alert. Coordination normal.  Skin: Skin is warm and dry. No rash noted.  Psychiatric: He has a normal mood and affect.  Nursing note and vitals reviewed.    ED Treatments / Results  DIAGNOSTIC STUDIES: Oxygen Saturation is 97% on RA, nl by my interpretation.    COORDINATION OF CARE: 10:42 PM Discussed treatment plan with pt at bedside which includes labs, EKG, and pt agreed to plan.   Labs (all labs ordered are listed, but only abnormal results are displayed) Labs Reviewed  CBC - Abnormal; Notable for the following:       Result Value   RBC 3.20 (*)    Hemoglobin 10.3 (*)    HCT 30.8 (*)    All other components within normal limits  COMPREHENSIVE METABOLIC PANEL  POC OCCULT BLOOD, ED    EKG  EKG Interpretation  Date/Time:  Friday December 11 2015 21:20:08 EST Ventricular Rate:  92 PR Interval:  136 QRS Duration: 92 QT Interval:  344 QTC Calculation: 425 R Axis:   31 Text Interpretation:  Sinus rhythm with occasional Premature ventricular complexes Incomplete right bundle branch block Borderline ECG No significant  change since last tracing Confirmed by Winfred Leeds  MD, Ahava Kissoon [phone]) on 12/11/2015 9:26:20 PM       Radiology No results found.  Procedures Procedures (including critical care time)  Medications Ordered in ED Medications - No data to display  Results for orders placed or performed during the hospital encounter of 12/11/15  Comprehensive metabolic panel  Result Value Ref Range   Sodium 139 135 - 145 mmol/L   Potassium 4.1 3.5 - 5.1 mmol/L   Chloride 105 101 - 111 mmol/L   CO2 25 22 - 32 mmol/L   Glucose, Bld 185 (H) 65 - 99 mg/dL   BUN 21 (H) 6 - 20 mg/dL   Creatinine, Ser 0.98 0.61 - 1.24 mg/dL   Calcium 8.1 (L) 8.9 - 10.3 mg/dL   Total Protein 5.9 (L) 6.5 - 8.1 g/dL   Albumin 2.9 (L) 3.5 - 5.0 g/dL   AST 20 15 - 41 U/L   ALT 21 17 - 63 U/L  Alkaline Phosphatase 48 38 - 126 U/L   Total Bilirubin 0.8 0.3 - 1.2 mg/dL   GFR calc non Af Amer >60 >60 mL/min   GFR calc Af Amer >60 >60 mL/min   Anion gap 9 5 - 15  CBC  Result Value Ref Range   WBC 5.5 4.0 - 10.5 K/uL   RBC 3.20 (L) 4.22 - 5.81 MIL/uL   Hemoglobin 10.3 (L) 13.0 - 17.0 g/dL   HCT 30.8 (L) 39.0 - 52.0 %   MCV 96.3 78.0 - 100.0 fL   MCH 32.2 26.0 - 34.0 pg   MCHC 33.4 30.0 - 36.0 g/dL   RDW 13.8 11.5 - 15.5 %   Platelets 152 150 - 400 K/uL  Urinalysis, Routine w reflex microscopic (not at [facility])  Result Value Ref Range   Color, Urine YELLOW YELLOW   APPearance CLEAR CLEAR   Specific Gravity, Urine 1.023 1.005 - 1.030   pH 6.5 5.0 - 8.0   Glucose, UA NEGATIVE NEGATIVE mg/dL   Hgb urine dipstick NEGATIVE NEGATIVE   Bilirubin Urine NEGATIVE NEGATIVE   Ketones, ur NEGATIVE NEGATIVE mg/dL   Protein, ur NEGATIVE NEGATIVE mg/dL   Nitrite NEGATIVE NEGATIVE   Leukocytes, UA NEGATIVE NEGATIVE   Dg Chest 2 View  Result Date: 11/15/2015 CLINICAL DATA:  Cough. Congestion. Sore throat for 4 days. Ex-smoker. EXAM: CHEST  2 VIEW COMPARISON:  06/15/2015 FINDINGS: Moderate lower thoracic spondylosis. Midline trachea.  Normal heart size. Tortuous thoracic aorta. No pleural effusion or pneumothorax. Vague increased density over the right apex is again identified, felt to relate to anterior first right rib. Clear lungs. IMPRESSION: No acute cardiopulmonary disease. Electronically Signed   By: [name] M.D.   On: 11/15/2015 16:16   Dg Abd Acute W/chest  Result Date: 11/18/2015 CLINICAL DATA:  Crohn's disease EXAM: DG ABDOMEN ACUTE W/ 1V CHEST COMPARISON:  04/06/2015 FINDINGS: Normal heart size. Clear lungs. Basilar nipple shadows are noted. There is no free intraperitoneal gas. Scattered air and fluid-filled loops of bowel are seen across the abdomen affecting both small and large bowel. Small bowel loops are mildly dilated. There is borderline distention of the colon. Postop changes in the right lower quadrant are noted. IMPRESSION: Dilated bowel loops with air-fluid levels suggest ileus. Early small-bowel obstruction cannot be excluded. No free intraperitoneal gas. No active cardiopulmonary disease. Electronically Signed   By: Marybelle Killings M.D.   On: 11/18/2015 09:52   Initial Impression / Assessment and Plan / ED Course  I have reviewed the triage vital signs and the nursing notes.  Pertinent labs & imaging results that were available during my care of the patient were reviewed by me and considered in my medical decision making (see chart for details).  Clinical Course     11:30 PM patient resting comfortably. Concern for GI bleeding secondary to prednisone. Prednisone likely cause of hyperglycemia as well. Hospitalist physician Dr. Eulas Post consulted. Plan transfer to [facility] telemetry. 23 hour observation Patient will need GI consult. Clear liquid diet Hemoglobin is stable from 2016 Final Clinical Impressions(s) / ED Diagnoses  Diagnoses #1 rectal bleeding #2 hyperglycemia #3 anemia Final diagnoses:  None    New Prescriptions New Prescriptions   No medications on file        Orlie Dakin, MD 12/11/15 2338

## 2015-12-12 DIAGNOSIS — K50811 Crohn's disease of both small and large intestine with rectal bleeding: Secondary | ICD-10-CM | POA: Diagnosis not present

## 2015-12-12 DIAGNOSIS — K625 Hemorrhage of anus and rectum: Secondary | ICD-10-CM | POA: Diagnosis not present

## 2015-12-12 DIAGNOSIS — H409 Unspecified glaucoma: Secondary | ICD-10-CM

## 2015-12-12 DIAGNOSIS — D649 Anemia, unspecified: Secondary | ICD-10-CM

## 2015-12-12 DIAGNOSIS — K50919 Crohn's disease, unspecified, with unspecified complications: Secondary | ICD-10-CM | POA: Diagnosis not present

## 2015-12-12 DIAGNOSIS — K5090 Crohn's disease, unspecified, without complications: Secondary | ICD-10-CM

## 2015-12-12 DIAGNOSIS — K921 Melena: Secondary | ICD-10-CM | POA: Diagnosis not present

## 2015-12-12 LAB — TYPE AND SCREEN
ABO/RH(D): O POS
Antibody Screen: NEGATIVE

## 2015-12-12 LAB — GLUCOSE, CAPILLARY
GLUCOSE-CAPILLARY: 84 mg/dL (ref 65–99)
GLUCOSE-CAPILLARY: 102 mg/dL — AB (ref 65–99)

## 2015-12-12 LAB — HEMOGLOBIN AND HEMATOCRIT, BLOOD
HCT: 28.1 % — ABNORMAL LOW (ref 39.0–52.0)
HCT: 32.2 % — ABNORMAL LOW (ref 39.0–52.0)
HCT: 28.7 % — ABNORMAL LOW (ref 39.0–52.0)
HEMATOCRIT: 26.7 % — AB (ref 39.0–52.0)
HEMOGLOBIN: 9.4 g/dL — AB (ref 13.0–17.0)
HEMOGLOBIN: 9 g/dL — AB (ref 13.0–17.0)
Hemoglobin: 10.6 g/dL — ABNORMAL LOW (ref 13.0–17.0)
Hemoglobin: 9.7 g/dL — ABNORMAL LOW (ref 13.0–17.0)

## 2015-12-12 LAB — PROTIME-INR
INR: 1.08
Prothrombin Time: 14.1 seconds (ref 11.4–15.2)

## 2015-12-12 LAB — ABO/RH: ABO/RH(D): O POS

## 2015-12-12 MED ORDER — INSULIN ASPART 100 UNIT/ML ~~LOC~~ SOLN: 0.0000 [IU] | Freq: Three times a day (TID) | SUBCUTANEOUS | Status: DC

## 2015-12-12 MED ORDER — ACETAMINOPHEN 325 MG PO TABS: 650.0000 mg | ORAL_TABLET | Freq: Four times a day (QID) | ORAL | Status: DC | PRN

## 2015-12-12 MED ORDER — PEG 3350-KCL-NA BICARB-NACL 420 G PO SOLR
4000.0000 mL | Freq: Once | ORAL | Status: AC
  Administered 2015-12-12: 4000 mL via ORAL

## 2015-12-12 MED ORDER — SODIUM CHLORIDE 0.9% FLUSH
3.0000 mL | Freq: Two times a day (BID) | INTRAVENOUS | Status: DC
  Administered 2015-12-12: 3 mL via INTRAVENOUS

## 2015-12-12 MED ORDER — ONDANSETRON HCL 4 MG/2ML IJ SOLN: 4.0000 mg | Freq: Four times a day (QID) | INTRAMUSCULAR | Status: DC | PRN

## 2015-12-12 MED ORDER — ACETAMINOPHEN 650 MG RE SUPP: 650.0000 mg | Freq: Four times a day (QID) | RECTAL | Status: DC | PRN

## 2015-12-12 MED ORDER — ZOLPIDEM TARTRATE 5 MG PO TABS
5.0000 mg | ORAL_TABLET | Freq: Every evening | ORAL | Status: DC | PRN
  Administered 2015-12-12: 5 mg via ORAL
  Filled 2015-12-12: qty 1

## 2015-12-12 MED ORDER — SODIUM CHLORIDE 0.9 % IV SOLN
INTRAVENOUS | Status: DC
  Administered 2015-12-12 – 2015-12-13 (×3): via INTRAVENOUS

## 2015-12-12 MED ORDER — ONDANSETRON HCL 4 MG PO TABS: 4.0000 mg | ORAL_TABLET | Freq: Four times a day (QID) | ORAL | Status: DC | PRN

## 2015-12-12 MED ORDER — PREDNISONE 5 MG PO TABS
25.0000 mg | ORAL_TABLET | Freq: Every day | ORAL | Status: DC
  Administered 2015-12-12: 25 mg via ORAL
  Filled 2015-12-12: qty 1

## 2015-12-12 MED ORDER — PANTOPRAZOLE SODIUM 40 MG IV SOLR
40.0000 mg | Freq: Two times a day (BID) | INTRAVENOUS | Status: DC
  Administered 2015-12-12 (×2): 40 mg via INTRAVENOUS
  Filled 2015-12-12: qty 40

## 2015-12-12 NOTE — Progress Notes (Signed)
Orthostatic VS  Lying      BP- 138/74   HR- 74 Sitting     BP- 137/69   HR- 73 Standing BP- 121/77   HR- 85

## 2015-12-12 NOTE — H&P (Signed)
History and Physical    Michael [name] DOB: January 25, 1932 DOA: 12/11/2015  PCP: [name], MD  [facility]  Patient coming from: [facility]  Chief Complaint: Rectal bleeding, maroon stools  HPI: Michael [name] is a 82 y.o. gentleman with a history of Crohn's disease, glaucoma, and chronic anemia who developed BRBPR on the day of presentation.  He has been on steroids since November 8th for his Crohn's disease (currently tapering by 51m per week; started at 474mdaily, current dose is 2556maily).  He denies any associated abdominal pain, specifically no epigastric pain.  No nausea or vomiting, but he has had increased indigestion and acid reflux in the past 24 hours as well.  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  He denies light-headedness or dizziness.  He has had three blood bowel movements in the past 24 hours, the last was at 8pm.  He has not had a significant BM [facility].  He has never had BRBPR before, though he reports a remote history of needing a blood transfusion for symptomatic anemia many years ago.   ED Course: Hemoglobin appears stable from one year ago at 10.3.  Gross blood present on rectal exam.  [facility]  Review of Systems: Elevated blood sugars since being on steroids.  Increased nocturia since being on steroids.  He denies dysuria or hematuria.  Insomnia since being on steroids.  Otherwise, 10 systems reviewed and negative except as stated in the HPI.   Past Medical History:  Diagnosis Date  . Anemia   . [place] Gl[place]   . Inguinal hernia recurrent bilateral     Past Surgical History:  Procedure Laterality Date  . ABDOMINAL SURGERY    . INGUINAL HERNIA REPAIR Bilateral 05/21/2014   Procedure: OPEN BILATERAL INGUINAL HERNIA REPAIRS WITH MESH;  Surgeon: MatDonnie MesaD;  [facility];   Laterality: Bilateral;  . SMALL INTESTINE SURGERY  1968, 2001    related to Chrohn's disease  . TONSILLECTOMY       reports that he quit smoking about 39 years ago. He has never used smokeless tobacco. He reports that he drinks alcohol. He reports that he does not use drugs.  He is married.  Retired salHotel managerNo Known Allergies  Family History  Problem Relation Age of Onset  . Deep vein thrombosis Mother   . Heart disease Father      Prior to Admission medications   Medication Sig Start Date End Date Taking? Authorizing Provider  brimonidine (ALPHAGAN) 0.2 % ophthalmic solution Place 1 drop into both eyes 2 (two) times daily. 11/20/15  Yes Historical Provider, MD  Cyanocobalamin (VITAMIN B-12 IJ) Inject 1 mL as directed every 30 (thirty) days.    Yes Historical Provider, MD  dorzolamide-timolol (COSOPT) 22.3-6.8 MG/ML ophthalmic solution Place 1 drop into both eyes 2 (two) times daily. 11/05/15  Yes Historical Provider, MD  IRON PO Take 1 tablet by mouth 3 (three) times a week.    Yes Historical Provider, MD  latanoprost (XALATAN) 0.005 % ophthalmic solution Place 1 drop into the left eye at bedtime.    Yes Historical Provider, MD  prednisoLONE acetate (PRED FORTE) 1 % ophthalmic suspension Place 1 drop into the right eye 2 (two) times daily. 11/16/15  Yes Historical Provider, MD  predniSONE (DELTASONE) 10 MG  tablet Take 25 mg by mouth daily. 11/18/15  Yes Historical Provider, MD  PROLENSA 0.07 % SOLN Place 1 drop into the right eye daily. 11/16/15  Yes Historical Provider, MD    Physical Exam: Vitals:   12/11/15 2230 12/11/15 2300 12/11/15 2330 12/12/15 0102  BP: 167/94 139/68 130/77 (!) 144/92  Pulse: 93 88 94 89  Resp: 17 19 14 16   Temp:    98 F (36.7 C)  TempSrc:    Oral  SpO2: 99% 97% 97% 100%  Weight:      Height:          Constitutional: NAD, calm, comfortable, NONtoxic appearing.  Very pleasant. Vitals:   12/11/15 2230 12/11/15 2300 12/11/15 2330 12/12/15 0102    BP: 167/94 139/68 130/77 (!) 144/92  Pulse: 93 88 94 89  Resp: 17 19 14 16   Temp:    98 F (36.7 C)  TempSrc:    Oral  SpO2: 99% 97% 97% 100%  Weight:      Height:       Eyes: pupils appear equal.  lids are normal. ENMT: Mucous membranes are moist. Normal dentition.  Neck: normal appearance, supple Respiratory: clear to auscultation bilaterally, no wheezing, no crackles. Normal respiratory effort. No accessory muscle use.  Cardiovascular: Normal rate, regular rhythm, no murmurs / rubs / gallops. No extremity edema. 2+ pedal pulses. GI: abdomen is soft and compressible.  No distention.  No tenderness.  No guarding.  Bowel sounds are present. Musculoskeletal:  No joint deformity in upper and lower extremities. Good ROM, no contractures. Normal muscle tone.  Skin: no rashes, warm and dry Neurologic: No focal deficits. Psychiatric: Normal judgment and insight. Alert and oriented x 3. Normal mood.     Labs on Admission: I have personally reviewed following labs and imaging studies  CBC:  Recent Labs Lab 12/11/15 2158  WBC 5.5  HGB 10.3*  HCT 30.8*  MCV 96.3  PLT 616   Basic Metabolic Panel:  Recent Labs Lab 12/11/15 2158  NA 139  K 4.1  CL 105  CO2 25  GLUCOSE 185*  BUN 21*  CREATININE 0.98  CALCIUM 8.1*   GFR: Estimated Creatinine Clearance: 63 mL/min (by C-G formula based on SCr of 0.98 mg/dL). Liver Function Tests:  Recent Labs Lab 12/11/15 2158  AST 20  ALT 21  ALKPHOS 48  BILITOT 0.8  PROT 5.9*  ALBUMIN 2.9*   Urine analysis:    Component Value Date/Time   COLORURINE YELLOW 12/11/2015 2246   APPEARANCEUR CLEAR 12/11/2015 2246   LABSPEC 1.023 12/11/2015 2246   PHURINE 6.5 12/11/2015 2246   GLUCOSEU NEGATIVE 12/11/2015 2246   HGBUR NEGATIVE 12/11/2015 2246   BILIRUBINUR NEGATIVE 12/11/2015 2246   KETONESUR NEGATIVE 12/11/2015 2246   PROTEINUR NEGATIVE 12/11/2015 2246   NITRITE NEGATIVE 12/11/2015 2246   LEUKOCYTESUR NEGATIVE 12/11/2015 2246    EKG: Independently reviewed. NSR with PVCs.  No acute ST segment changes.  Assessment/Plan Principal Problem:   Rectal bleeding Active Problems:   Glaucoma   Crohn's disease ([place])   Anemia      BRBPR, acute lower GI bleed suspected.  History of Crohn's disease, currently on a steroid taper. --Serial H/H --Type and screen in the event transfusion is indicated --[name] consult in the AM --Clear liquid diet for now --Continue current dose of oral prednisone for now --IV PPI for GI prophylaxis --NS at 100cc/hr  Glaucoma --Patient wants to take his own eyedrops.   DVT prophylaxis: Early abmulation.  No anticoagulants due to active bleeding. Code Status: FULL Family Communication: Patient's wife present at time of admission. Disposition Plan: Expect he will go home at discharge. Consults called: GI Admission status: Place in observation with telemetry monitoring.   TIME SPENT: 60 minutes   Eber Jones MD [facility] Pager [phone]  If 7PM-7AM, please contact night-coverage [email or web address] Password [email or web address]  12/12/2015, 2:40 AM

## 2015-12-12 NOTE — Progress Notes (Signed)
[facility]  82 y/o male admitted by my colleague this AM with 3 episodes of rectal bleed.No further bleed since last night 8 PM. No abdominal pain or feeling of lightheadedness.    H&P and chart reviewed, patient examined. History of Crohn's disease dating back to 1966. In 1968 he had a terminal ileal section and had this revised in 2002 by Dr Margot Chimes. He was recently seen with a early partial small bowel obstruction with CT scan suggesting Crohn's ileitis and has been following up with Dr. Wynetta Emery, recently seen in the office about 3 weeks ago and has been undergoing a steroid taper since Nov 8th.   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

## 2015-12-12 NOTE — Consult Note (Signed)
[facility] CONSULT Reason for consult: Crohns with GI bleeding  Referring Physician: Hospitalist. PCP: Dr. Earle Gell.  Michael [name] is an 82 y.o. male.  HPI: he has had a long history of Crohn's disease dating back to 1966. In 1968 he had a terminal ileal section and had this revised in 2002 by Dr Margot Chimes. His last colonoscopy was several years ago and he had some stricturing in the rectum and diverticulosis as well. He has B-12 deficiency due to resection of his terminal ileum. He was recently seen with a early partial small bowel obstruction with CT scan suggesting Crohn's ileitis and was treated with steroids. He's been following up with Dr. Wynetta Emery was just seen in the office about 3 weeks ago and has been undergoing a steroid taper. He was started off on 40 mg a day and every week decreased by 5 mg. He was on the low residue diet. He has been feeling fairly well with no obstructive symptoms. They call last night and stated that he was having a large amount of bright red blood per rectum. He has had no abdominal pain or distention. His initial hemoglobin was 10.3 but has dropped to 9.4 this morning. He is on clear liquids.  Past Medical History:  Diagnosis Date  . Anemia   . Crohn's disease ([place])   . Glaucoma   . Inguinal hernia recurrent bilateral     Past Surgical History:  Procedure Laterality Date  . ABDOMINAL SURGERY    . INGUINAL HERNIA REPAIR Bilateral 05/21/2014   Procedure: OPEN BILATERAL INGUINAL HERNIA REPAIRS WITH MESH;  Surgeon: Donnie Mesa, MD;  Location: [facility];  Service: General;  Laterality: Bilateral;  . SMALL INTESTINE SURGERY  1968, 2001    related to Chrohn's disease  . TONSILLECTOMY      Family History  Problem Relation Age of Onset  . Deep vein thrombosis Mother   . Heart disease Father     Social History:  reports that he quit smoking about 39 years ago. He has never used smokeless tobacco. He reports that he drinks  alcohol. He reports that he does not use drugs.  Allergies: No Known Allergies  Medications; Prior to Admission medications   Medication Sig Start Date End Date Taking? Authorizing Provider  brimonidine (ALPHAGAN) 0.2 % ophthalmic solution Place 1 drop into both eyes 2 (two) times daily. 11/20/15  Yes Historical Provider, MD  Cyanocobalamin (VITAMIN B-12 IJ) Inject 1 mL as directed every 30 (thirty) days.    Yes Historical Provider, MD  dorzolamide-timolol (COSOPT) 22.3-6.8 MG/ML ophthalmic solution Place 1 drop into both eyes 2 (two) times daily. 11/05/15  Yes Historical Provider, MD  IRON PO Take 1 tablet by mouth 3 (three) times a week.    Yes Historical Provider, MD  latanoprost (XALATAN) 0.005 % ophthalmic solution Place 1 drop into the left eye at bedtime.    Yes Historical Provider, MD  prednisoLONE acetate (PRED FORTE) 1 % ophthalmic suspension Place 1 drop into the right eye 2 (two) times daily. 11/16/15  Yes Historical Provider, MD  predniSONE (DELTASONE) 10 MG tablet Take 25 mg by mouth daily. 11/18/15  Yes Historical Provider, MD  PROLENSA 0.07 % SOLN Place 1 drop into the right eye daily. 11/16/15  Yes Historical Provider, MD   . insulin aspart  0-15 Units Subcutaneous TID WC  . pantoprazole  40 mg Intravenous Q12H  . polyethylene glycol-electrolytes  4,000 mL Oral Once  . predniSONE  25 mg Oral  Q breakfast  . sodium chloride flush  3 mL Intravenous Q12H   PRN Meds acetaminophen **OR** acetaminophen, ondansetron **OR** ondansetron (ZOFRAN) IV Results for orders placed or performed during the hospital encounter of 12/11/15 (from the past 48 hour(s))  Comprehensive metabolic panel     Status: Abnormal   Collection Time: 12/11/15  9:58 PM  Result Value Ref Range   Sodium 139 135 - 145 mmol/L   Potassium 4.1 3.5 - 5.1 mmol/L   Chloride 105 101 - 111 mmol/L   CO2 25 22 - 32 mmol/L   Glucose, Bld 185 (H) 65 - 99 mg/dL   BUN 21 (H) 6 - 20 mg/dL   Creatinine, Ser 0.98 0.61 - 1.24  mg/dL   Calcium 8.1 (L) 8.9 - 10.3 mg/dL   Total Protein 5.9 (L) 6.5 - 8.1 g/dL   Albumin 2.9 (L) 3.5 - 5.0 g/dL   AST 20 15 - 41 U/L   ALT 21 17 - 63 U/L   Alkaline Phosphatase 48 38 - 126 U/L   Total Bilirubin 0.8 0.3 - 1.2 mg/dL   GFR calc non Af Amer >60 >60 mL/min   GFR calc Af Amer >60 >60 mL/min    Comment: (NOTE) The eGFR has been calculated using the CKD EPI equation. This calculation has not been validated in all clinical situations. eGFR's persistently <60 mL/min signify possible Chronic Kidney Disease.    Anion gap 9 5 - 15  CBC     Status: Abnormal   Collection Time: 12/11/15  9:58 PM  Result Value Ref Range   WBC 5.5 4.0 - 10.5 K/uL   RBC 3.20 (L) 4.22 - 5.81 MIL/uL   Hemoglobin 10.3 (L) 13.0 - 17.0 g/dL   HCT 30.8 (L) 39.0 - 52.0 %   MCV 96.3 78.0 - 100.0 fL   MCH 32.2 26.0 - 34.0 pg   MCHC 33.4 30.0 - 36.0 g/dL   RDW 13.8 11.5 - 15.5 %   Platelets 152 150 - 400 K/uL  Urinalysis, Routine w reflex microscopic (not at [facility])     Status: None   Collection Time: 12/11/15 10:46 PM  Result Value Ref Range   Color, Urine YELLOW YELLOW   APPearance CLEAR CLEAR   Specific Gravity, Urine 1.023 1.005 - 1.030   pH 6.5 5.0 - 8.0   Glucose, UA NEGATIVE NEGATIVE mg/dL   Hgb urine dipstick NEGATIVE NEGATIVE   Bilirubin Urine NEGATIVE NEGATIVE   Ketones, ur NEGATIVE NEGATIVE mg/dL   Protein, ur NEGATIVE NEGATIVE mg/dL   Nitrite NEGATIVE NEGATIVE   Leukocytes, UA NEGATIVE NEGATIVE    Comment: MICROSCOPIC NOT DONE ON URINES WITH NEGATIVE PROTEIN, BLOOD, LEUKOCYTES, NITRITE, OR GLUCOSE <1000 mg/dL.  Protime-INR     Status: None   Collection Time: 12/12/15  6:16 AM  Result Value Ref Range   Prothrombin Time 14.1 11.4 - 15.2 seconds   INR 1.08   Hemoglobin and hematocrit, blood     Status: Abnormal   Collection Time: 12/12/15  6:16 AM  Result Value Ref Range   Hemoglobin 9.4 (L) 13.0 - 17.0 g/dL   HCT 28.1 (L) 39.0 - 52.0 %  Glucose, capillary     Status: None    Collection Time: 12/12/15  7:43 AM  Result Value Ref Range   Glucose-Capillary 84 65 - 99 mg/dL    No results found.             Blood pressure 138/78, pulse 74, temperature 98.2 F (36.8 C), temperature  source Oral, resp. rate 18, height 5' 11"  (1.803 m), weight 81.6 kg (180 lb), SpO2 100 %.  Physical exam:   General--White male no acute distress sitting finishing clear liquid breakfast watching television ENT-- nonicteric Neck-- no lymphadenopathy Heart-- regular rate and rhythm without murmurs are gallops Lungs-- clear Abdomen-- soft nontender nondistended Psych-- alert oriented no signs of depression answers questions appropriately   Assessment: 1. G.I. bleed. This is an acute bleed manifested by bright red blood per rectum. He's had a significant drop hemoglobin. He has a history of diverticulosis from previous colonoscopy and this does seem more like a diverticular bleed. It is always possible it could be Crohn's disease but his Crohn's symptoms have been doing well with recent steroid treatment. 2. Crohn's disease. He has had 2 previous surgeries on the terminal ileum and recent scan shown ileal disease proximal to that. 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 He's been doing reasonably well in steroid taper.  Plan: 1. We will continue the patient on clear liquids go ahead and get him cleaned out and proceed tomorrow with colonoscopy. Feel that it's important to know if this is Crohn's ulceration or diverticulosis causing the bleeding. Would follow his hemoglobin transfuse is needed.   Ronda Rajkumar JR,Acacia Latorre L 12/12/2015, 8:31 AM   This note was created using voice recognition software and minor errors may Have occurred unintentionally. Pager: [phone] If no answer or after hours call [phone]

## 2015-12-13 ENCOUNTER — Encounter (HOSPITAL_COMMUNITY)
Admission: EM | Disposition: A | Discharge status: Home / Self Care | Payer: Self-pay | Source: Home / Self Care | Attending: Emergency Medicine

## 2015-12-13 ENCOUNTER — Encounter (HOSPITAL_COMMUNITY): Payer: Self-pay | Admitting: Gastroenterology

## 2015-12-13 DIAGNOSIS — K50911 Crohn's disease, unspecified, with rectal bleeding: Secondary | ICD-10-CM | POA: Diagnosis not present

## 2015-12-13 DIAGNOSIS — K50118 Crohn's disease of large intestine with other complication: Secondary | ICD-10-CM | POA: Diagnosis not present

## 2015-12-13 DIAGNOSIS — D518 Other vitamin B12 deficiency anemias: Secondary | ICD-10-CM | POA: Diagnosis not present

## 2015-12-13 DIAGNOSIS — K625 Hemorrhage of anus and rectum: Secondary | ICD-10-CM | POA: Diagnosis not present

## 2015-12-13 DIAGNOSIS — K50811 Crohn's disease of both small and large intestine with rectal bleeding: Secondary | ICD-10-CM | POA: Diagnosis not present

## 2015-12-13 DIAGNOSIS — H409 Unspecified glaucoma: Secondary | ICD-10-CM | POA: Diagnosis not present

## 2015-12-13 DIAGNOSIS — K921 Melena: Secondary | ICD-10-CM | POA: Diagnosis not present

## 2015-12-13 DIAGNOSIS — K50018 Crohn's disease of small intestine with other complication: Secondary | ICD-10-CM | POA: Diagnosis not present

## 2015-12-13 HISTORY — PX: COLONOSCOPY: SHX5424

## 2015-12-13 LAB — BASIC METABOLIC PANEL
ANION GAP: 5 (ref 5–15)
BUN: 10 mg/dL (ref 6–20)
CALCIUM: 8.1 mg/dL — AB (ref 8.9–10.3)
CO2: 28 mmol/L (ref 22–32)
Chloride: 107 mmol/L (ref 101–111)
Creatinine, Ser: 0.97 mg/dL (ref 0.61–1.24)
GLUCOSE: 87 mg/dL (ref 65–99)
POTASSIUM: 4.5 mmol/L (ref 3.5–5.1)
SODIUM: 140 mmol/L (ref 135–145)

## 2015-12-13 SURGERY — COLONOSCOPY
Anesthesia: Moderate Sedation

## 2015-12-13 MED ORDER — MIDAZOLAM HCL 5 MG/ML IJ SOLN
INTRAMUSCULAR | Status: AC
  Filled 2015-12-13: qty 2

## 2015-12-13 MED ORDER — MIDAZOLAM HCL 5 MG/5ML IJ SOLN
INTRAMUSCULAR | Status: DC | PRN
  Administered 2015-12-13 (×4): 1 mg via INTRAVENOUS

## 2015-12-13 MED ORDER — SODIUM CHLORIDE 0.9 % IV SOLN: INTRAVENOUS | Status: DC

## 2015-12-13 MED ORDER — ZOLPIDEM TARTRATE 5 MG PO TABS: 5.0000 mg | ORAL_TABLET | Freq: Every evening | ORAL | 0 refills | Status: DC | PRN

## 2015-12-13 MED ORDER — PREDNISONE 10 MG PO TABS: 30.0000 mg | ORAL_TABLET | Freq: Every day | ORAL | Status: DC

## 2015-12-13 MED ORDER — FENTANYL CITRATE (PF) 100 MCG/2ML IJ SOLN
INTRAMUSCULAR | Status: DC | PRN
  Administered 2015-12-13 (×3): 12.5 ug via INTRAVENOUS

## 2015-12-13 MED ORDER — TAMSULOSIN HCL 0.4 MG PO CAPS: 0.4000 mg | ORAL_CAPSULE | Freq: Every day | ORAL | 0 refills | Status: DC

## 2015-12-13 MED ORDER — FENTANYL CITRATE (PF) 100 MCG/2ML IJ SOLN
INTRAMUSCULAR | Status: AC
  Filled 2015-12-13: qty 2

## 2015-12-13 NOTE — Discharge Instructions (Signed)
Diabetes Mellitus and Food It is important for you to manage your blood sugar (glucose) level. Your blood glucose level can be greatly affected by what you eat. Eating healthier foods in the appropriate amounts throughout the day at about the same time each day will help you control your blood glucose level. It can also help slow or prevent worsening of your diabetes mellitus. Healthy eating may even help you improve the level of your blood pressure and reach or maintain a healthy weight. General recommendations for healthful eating and cooking habits include:  Eating meals and snacks regularly. Avoid going long periods of time without eating to lose weight.  Eating a diet that consists mainly of plant-based foods, such as fruits, vegetables, nuts, legumes, and whole grains.  Using low-heat cooking methods, such as baking, instead of high-heat cooking methods, such as deep frying. Work with your dietitian to make sure you understand how to use the Nutrition Facts information on food labels. How can food affect me? Carbohydrates  Carbohydrates affect your blood glucose level more than any other type of food. Your dietitian will help you determine how many carbohydrates to eat at each meal and teach you how to count carbohydrates. Counting carbohydrates is important to keep your blood glucose at a healthy level, especially if you are using insulin or taking certain medicines for diabetes mellitus. Alcohol  Alcohol can cause sudden decreases in blood glucose (hypoglycemia), especially if you use insulin or take certain medicines for diabetes mellitus. Hypoglycemia can be a life-threatening condition. Symptoms of hypoglycemia (sleepiness, dizziness, and disorientation) are similar to symptoms of having too much alcohol. If your health care provider has given you approval to drink alcohol, do so in moderation and use the following guidelines:  Women should not have more than one drink per day, and men  should not have more than two drinks per day. One drink is equal to:  12 oz of beer.  5 oz of wine.  1 oz of hard liquor.  Do not drink on an empty stomach.  Keep yourself hydrated. Have water, diet soda, or unsweetened iced tea.  Regular soda, juice, and other mixers might contain a lot of carbohydrates and should be counted. What foods are not recommended? As you make food choices, it is important to remember that all foods are not the same. Some foods have fewer nutrients per serving than other foods, even though they might have the same number of calories or carbohydrates. It is difficult to get your body what it needs when you eat foods with fewer nutrients. Examples of foods that you should avoid that are high in calories and carbohydrates but low in nutrients include:  Trans fats (most processed foods list trans fats on the Nutrition Facts label).  Regular soda.  Juice.  Candy.  Sweets, such as cake, pie, doughnuts, and cookies.  Fried foods. What foods can I eat? Eat nutrient-rich foods, which will nourish your body and keep you healthy. The food you should eat also will depend on several factors, including:  The calories you need.  The medicines you take.  Your weight.  Your blood glucose level.  Your blood pressure level.  Your cholesterol level. You should eat a variety of foods, including:  Protein.  Lean cuts of meat.  Proteins low in saturated fats, such as fish, egg whites, and beans. Avoid processed meats.  Fruits and vegetables.  Fruits and vegetables that may help control blood glucose levels, such as apples, mangoes, and  yams.  Dairy products.  Choose fat-free or low-fat dairy products, such as milk, yogurt, and cheese.  Grains, bread, pasta, and rice.  Choose whole grain products, such as multigrain bread, whole oats, and brown rice. These foods may help control blood pressure.  Fats.  Foods containing healthful fats, such as nuts,  avocado, olive oil, canola oil, and fish. Does everyone with diabetes mellitus have the same meal plan? Because every person with diabetes mellitus is different, there is not one meal plan that works for everyone. It is very important that you meet with a dietitian who will help you create a meal plan that is just right for you. This information is not intended to replace advice given to you by your health care provider. Make sure you discuss any questions you have with your health care provider. Document Released: 09/23/2004 Document Revised: 06/04/2015 Document Reviewed: 11/23/2012 Elsevier Interactive Patient Education  2017 [employer] Low-Fiber Diet Fiber is found in fruits, vegetables, and whole grains. A low-fiber diet restricts fibrous foods that are not digested in the small intestine. A diet containing about 10-15 grams of fiber per day is considered low fiber. Low-fiber diets may be used to:  Promote healing and rest the bowel during intestinal flare-ups.  Prevent blockage of a partially obstructed or narrowed gastrointestinal tract.  Reduce fecal weight and volume.  Slow the movement of feces. You may be on a low-fiber diet as a transitional diet following surgery, after an injury (trauma), or because of a short (acute) or lifelong (chronic) illness. Your health care provider will determine the length of time you need to stay on this diet. What do I need to know about a low-fiber diet? Always check the fiber content on the packaging's Nutrition Facts label, especially on foods from the grains list. Ask your dietitian if you have questions about specific foods that are related to your condition, especially if the food is not listed below. In general, a low-fiber food will have less than 2 g of fiber. What foods can I eat? Grains  All breads and crackers made with white flour. Sweet rolls, doughnuts, waffles, pancakes, [place] toast, bagels. Pretzels, Melba toast, zwieback.  Well-cooked cereals, such as cornmeal, farina, or cream cereals. Dry cereals that do not contain whole grains, fruit, or nuts, such as refined corn, wheat, rice, and oat cereals. Potatoes prepared any way without skins, plain pastas and noodles, refined white rice. Use white flour for baking and making sauces. Use allowed list of grains for casseroles, dumplings, and puddings. Vegetables  Strained tomato and vegetable juices. Fresh lettuce, cucumber, spinach. Well-cooked (no skin or pulp) or canned vegetables, such as asparagus, bean sprouts, beets, carrots, green beans, mushrooms, potatoes, pumpkin, spinach, yellow squash, tomato sauce/puree, turnips, yams, and zucchini. Keep servings limited to  cup. Fruits  All fruit juices except prune juice. Cooked or canned fruits without skin and seeds, such as applesauce, apricots, cherries, fruit cocktail, grapefruit, grapes, mandarin oranges, melons, peaches, pears, pineapple, and plums. Fresh fruits without skin, such as apricots, avocados, bananas, melons, pineapple, nectarines, and peaches. Keep servings limited to  cup or 1 piece. Meat and Other Protein Sources  Ground or well-cooked tender beef, ham, veal, lamb, pork, or poultry. Eggs, plain cheese. Fish, oysters, shrimp, lobster, and other seafood. Liver, organ meats. Smooth nut butters. Dairy  All milk products and alternative dairy substitutes, such as soy, rice, almond, and coconut, not containing added whole nuts, seeds, or added fruit. Beverages  Decaf coffee, fruit,  and vegetable juices or smoothies (small amounts, with no pulp or skins, and with fruits from allowed list), sports drinks, herbal tea. Condiments  Ketchup, mustard, vinegar, cream sauce, cheese sauce, cocoa powder. Spices in moderation, such as allspice, basil, bay leaves, celery powder or leaves, cinnamon, cumin powder, curry powder, ginger, mace, marjoram, onion or garlic powder, oregano, paprika, parsley flakes, ground pepper,  rosemary, sage, savory, tarragon, thyme, and turmeric. Sweets and Desserts  Plain cakes and cookies, pie made with allowed fruit, pudding, custard, cream pie. Gelatin, fruit, ice, sherbet, frozen ice pops. Ice cream, ice milk without nuts. Plain hard candy, honey, jelly, molasses, syrup, sugar, chocolate syrup, gumdrops, marshmallows. Limit overall sugar intake. Fats and Oil  Margarine, butter, cream, mayonnaise, salad oils, plain salad dressings made from allowed foods. Choose healthy fats such as olive oil, canola oil, and omega-3 fatty acids (such as found in salmon or tuna) when possible. Other  Bouillon, broth, or cream soups made from allowed foods. Any strained soup. Casseroles or mixed dishes made with allowed foods. The items listed above may not be a complete list of recommended foods or beverages. Contact your dietitian for more options.  What foods are not recommended? Grains  All whole wheat and whole grain breads and crackers. Multigrains, rye, bran seeds, nuts, or coconut. Cereals containing whole grains, multigrains, bran, coconut, nuts, raisins. Cooked or dry oatmeal, steel-cut oats. Coarse wheat cereals, granola. Cereals advertised as high fiber. Potato skins. Whole grain pasta, wild or brown rice. Popcorn. Coconut flour. Bran, buckwheat, corn bread, multigrains, rye, wheat germ. Vegetables  Fresh, cooked or canned vegetables, such as artichokes, asparagus, beet greens, broccoli, Brussels sprouts, cabbage, celery, cauliflower, corn, eggplant, kale, legumes or beans, okra, peas, and tomatoes. Avoid large servings of any vegetables, especially raw vegetables. Fruits  Fresh fruits, such as apples with or without skin, berries, cherries, figs, grapes, grapefruit, guavas, kiwis, mangoes, oranges, papayas, pears, persimmons, pineapple, and pomegranate. Prune juice and juices with pulp, stewed or dried prunes. Dried fruits, dates, raisins. Fruit seeds or skins. Avoid large servings of all  fresh fruits. Meats and Other Protein Sources  Tough, fibrous meats with gristle. Chunky nut butter. Cheese made with seeds, nuts, or other foods not recommended. Nuts, seeds, legumes (beans, including baked beans), dried peas, beans, lentils. Dairy  Yogurt or cheese that contains nuts, seeds, or added fruit. Beverages  Fruit juices with high pulp, prune juice. Caffeinated coffee and teas. Condiments  Coconut, maple syrup, pickles, olives. Sweets and Desserts  Desserts, cookies, or candies that contain nuts or coconut, chunky peanut butter, dried fruits. Jams, preserves with seeds, marmalade. Large amounts of sugar and sweets. Any other dessert made with fruits from the not recommended list. Other  Soups made from vegetables that are not recommended or that contain other foods not recommended. The items listed above may not be a complete list of foods and beverages to avoid. Contact your dietitian for more information.  This information is not intended to replace advice given to you by your health care provider. Make sure you discuss any questions you have with your health care provider. Document Released: 06/18/2001 Document Revised: 06/04/2015 Document Reviewed: 11/19/2012 Elsevier Interactive Patient Education  2017 [place].  Please take all your medications with you for your next visit with your Primary MD. Please request your Primary MD to go over all hospital test results at the follow up. Please ask your Primary MD to get all Hospital records sent to his/her office.  If you  experience worsening of your admission symptoms, develop shortness of breath, chest pain, suicidal or homicidal thoughts or a life threatening emergency, you must seek medical attention immediately by calling 911 or calling your MD.  Dennis Bast must read the complete instructions/literature along with all the possible adverse reactions/side effects for all the medicines you take including new medications that have  been prescribed to you. Take new medicines after you have completely understood and accpet all the possible adverse reactions/side effects.   Do not drive when taking pain medications or sedatives.    Do not take more than prescribed Pain, Sleep and Anxiety Medications  If you have smoked or chewed Tobacco in the last 2 yrs please stop. Stop any regular alcohol and or recreational drug use.  Wear Seat belts while driving.

## 2015-12-13 NOTE — Progress Notes (Signed)
Reviewed discharge information with patient and caregiver. Answered all questions. Patient/caregiver able to teach back medications and reasons to contact MD/911. Patient verbalizes importance of PCP follow up appointment.  Damain Broadus M. Michell Kader, RN  

## 2015-12-13 NOTE — Discharge Summary (Addendum)
Physician Discharge Summary  Michael [name] ACZ:[id number] DOB: [birth year] DOA: 12/11/2015  PCP: Michael Fair, MD  Admit date: 12/11/2015 Discharge date: 12/13/2015  Admitted From: home  Disposition:  home   Recommendations for Outpatient Follow-up:  1. Needs CBC in 1 wk  Home Health:    Equipment/Devices:      Discharge Condition:  stable   CODE STATUS:  Full code   Diet recommendation:  Heart healthy, low fiber Consultations:  GI    Discharge Diagnoses:  Principal Problem:   Rectal bleeding Active Problems:   Glaucoma   Crohn's disease ([place])   Anemia    Subjective: No bleeding since prior to coming into the hospital. No abdominal pain  Brief Summary: Michael [name] is an 82 y.o. gentleman with a history of Crohn's disease, glaucoma, and chronic anemia who developed BRBPR on the day of presentation.  He has been on steroids since November 8th for his Crohn's disease (currently tapering by 5m per week; started at 419mdaily, current dose is 2552maily).  He denies any associated abdominal pain, specifically no epigastric pain.  No nausea or vomiting, but he has had increased indigestion and acid reflux in the past 24 hours as well.  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  He denies light-headedness or dizziness.  He has had three blood bowel movements in the past 24 hours, the last was at 8pm.  [facility] has [facility] had a significant BM [facility].  He has never had BRBPR before, though he reports a remote history of needing a blood transfusion for symptomatic anemia many years ago.   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 and Iron deficiency - Hb 10.6>> 9.0- - follow closely as outpt - cont B12 and Iron  Hyperglycemia  - due to steroids- discussed low carb diet  Nocturia - has been getting up multiple times at night and once away ultimately feels the need to urinate and this is disturbing him - dicussed decreasing fluid intake in evening, decreasing carbs to improve hyperglycemia, taking PRN Ambien to help him sleep through the night and starting Flomax if these measures do not work    Glaucoma - cont home meds  Discharge Instructions  Discharge Instructions    Diet - low sodium heart healthy    Complete by:  As directed    Low carb and low fiber   Increase activity slowly    Complete by:  As directed        Medication List    TAKE these medications   brimonidine 0.2 % ophthalmic solution Commonly known as:  ALPHAGAN Place 1 drop into both eyes 2 (two) times daily.   dorzolamide-timolol 22.3-6.8 MG/ML ophthalmic solution Commonly known as:  COSOPT Place 1 drop into both eyes 2 (two) times daily.   IRON PO Take 1 tablet by mouth 3 (three) times a week.   prednisoLONE acetate 1 % ophthalmic suspension Commonly known as:  PRED FORTE Place 1 drop into the right eye 2 (two) times daily.   predniSONE 10  MG tablet Commonly known as:  DELTASONE Take 3 tablets (30 mg total) by mouth daily. What changed:  how much to take   PROLENSA 0.07 % Soln Generic drug:  Bromfenac Sodium Place 1 drop into the right eye daily.   tamsulosin 0.4 MG Caps capsule Commonly known as:  FLOMAX Take 1 capsule (0.4 mg total) by mouth daily after supper.   VITAMIN B-12 IJ Inject 1 mL as directed every 30 (thirty) days.   XALATAN 0.005 % ophthalmic solution Generic drug:  latanoprost Place 1 drop into the left eye at bedtime.   zolpidem 5 MG tablet Commonly known as:  AMBIEN Take 1 tablet (5 mg total) by mouth at bedtime as needed for sleep.        No Known Allergies   Procedures/Studies: Colonoscopy 12/3 - Patent end-to-side ileo-colonic anastomosis,                            characterized by ulceration.                           - Multiple ulcers in the terminal ileum. Biopsied.                            Probable active Crohn's                           - Non-bleeding internal hemorrhoids.                           - Hematochezia  Dg Chest 2 View  Result Date: 11/15/2015 CLINICAL DATA:  Cough. Congestion. Sore throat for 4 days. Ex-smoker. EXAM: CHEST  2 VIEW COMPARISON:  06/15/2015 FINDINGS: Moderate lower thoracic spondylosis. Midline trachea. Normal heart size. Tortuous thoracic aorta. No pleural effusion or pneumothorax. Vague increased density over the right apex is again identified, felt to relate to anterior first right rib. Clear lungs. IMPRESSION: No acute cardiopulmonary disease. Electronically Signed   By: [name] M.D.   On: 11/15/2015 16:16   Dg Abd Acute W/chest  Result Date: 11/18/2015 CLINICAL DATA:  Crohn's disease EXAM: DG ABDOMEN ACUTE W/ 1V CHEST COMPARISON:  04/06/2015 FINDINGS: Normal heart size. Clear lungs. Basilar nipple shadows are noted. There is no free intraperitoneal gas. Scattered air and fluid-filled loops of bowel are seen across the abdomen affecting both small and large bowel. Small bowel loops are mildly dilated. There is borderline distention of the colon. Postop changes in the right lower quadrant are noted. IMPRESSION: Dilated bowel loops with air-fluid levels suggest ileus. Early small-bowel obstruction cannot be excluded. No free intraperitoneal gas. No active cardiopulmonary disease. Electronically Signed   By: Marybelle Killings M.D.   On: 11/18/2015 09:52       Discharge Exam: Vitals:   12/13/15 0900 12/13/15 0906  BP: (!) 144/66 (!) 147/67  Pulse:  60  Resp:  16  Temp:     Vitals:   12/13/15 0850 12/13/15 0855 12/13/15 0900 12/13/15 0906  BP: 127/72 140/66 (!) 144/66 (!)  147/67  Pulse: 68 65  60  Resp: 17 19  16   Temp:      TempSrc:      SpO2: 100% 100%  99%  Weight:      Height:  General: Pt is alert, awake, not in acute distress Cardiovascular: RRR, S1/S2 +, no rubs, no gallops Respiratory: CTA bilaterally, no wheezing, no rhonchi Abdominal: Soft, NT, ND, bowel sounds + Extremities: no edema, no cyanosis    The results of significant diagnostics from this hospitalization (including imaging, microbiology, ancillary and laboratory) are listed below for reference.     Microbiology: No results found for this or any previous visit (from the past 240 hour(s)).   Labs: BNP (last 3 results) No results for input(s): BNP in the last 8760 hours. Basic Metabolic Panel:  Recent Labs Lab 12/11/15 2158 12/13/15 0522  NA 139 140  K 4.1 4.5  CL 105 107  CO2 25 28  GLUCOSE 185* 87  BUN 21* 10  CREATININE 0.98 0.97  CALCIUM 8.1* 8.1*   Liver Function Tests:  Recent Labs Lab 12/11/15 2158  AST 20  ALT 21  ALKPHOS 48  BILITOT 0.8  PROT 5.9*  ALBUMIN 2.9*   No results for input(s): LIPASE, AMYLASE in the last 168 hours. No results for input(s): AMMONIA in the last 168 hours. CBC:  Recent Labs Lab 12/11/15 2158 12/12/15 0616 12/12/15 0830 12/12/15 1427 12/12/15 2209  WBC 5.5  --   --   --   --   HGB 10.3* 9.4* 10.6* 9.7* 9.0*  HCT 30.8* 28.1* 32.2* 28.7* 26.7*  MCV 96.3  --   --   --   --   PLT 152  --   --   --   --    Cardiac Enzymes: No results for input(s): CKTOTAL, CKMB, CKMBINDEX, TROPONINI in the last 168 hours. BNP: Invalid input(s): POCBNP CBG:  Recent Labs Lab 12/12/15 0743 12/12/15 1733  GLUCAP 84 102*   D-Dimer No results for input(s): DDIMER in the last 72 hours. Hgb A1c No results for input(s): HGBA1C in the last 72 hours. Lipid Profile No results for input(s): CHOL, HDL, LDLCALC, TRIG, CHOLHDL, LDLDIRECT in the last 72 hours. Thyroid function studies No results for input(s): TSH, T4TOTAL,  T3FREE, THYROIDAB in the last 72 hours.  Invalid input(s): FREET3 Anemia work up No results for input(s): VITAMINB12, FOLATE, FERRITIN, TIBC, IRON, RETICCTPCT in the last 72 hours. Urinalysis    Component Value Date/Time   COLORURINE YELLOW 12/11/2015 2246   APPEARANCEUR CLEAR 12/11/2015 2246   LABSPEC 1.023 12/11/2015 2246   PHURINE 6.5 12/11/2015 2246   GLUCOSEU NEGATIVE 12/11/2015 2246   HGBUR NEGATIVE 12/11/2015 2246   BILIRUBINUR NEGATIVE 12/11/2015 2246   KETONESUR NEGATIVE 12/11/2015 2246   PROTEINUR NEGATIVE 12/11/2015 2246   NITRITE NEGATIVE 12/11/2015 2246   LEUKOCYTESUR NEGATIVE 12/11/2015 2246   Sepsis Labs Invalid input(s): PROCALCITONIN,  WBC,  LACTICIDVEN Microbiology No results found for this or any previous visit (from the past 240 hour(s)).   Time coordinating discharge: Over 30 minutes  SIGNED:   Debbe Odea, MD  [facility] 12/13/2015, 10:31 AM Pager   If 7PM-7AM, please contact night-coverage [email or web address] Password [email or web address]

## 2015-12-13 NOTE — Progress Notes (Signed)
Patient refused to get blood sugar checked tonight.

## 2015-12-13 NOTE — Interval H&P Note (Signed)
History and Physical Interval Note:  12/13/2015 8:12 AM  Michael [name]  has presented today for surgery, with the diagnosis of crohns, GI bleeding  The various methods of treatment have been discussed with the patient and family. After consideration of risks, benefits and other options for treatment, the patient has consented to  Procedure(s): COLONOSCOPY (N/A) as a surgical intervention .  The patient's history has been reviewed, patient examined, no change in status, stable for surgery.  I have reviewed the patient's chart and labs.  Questions were answered to the patient's satisfaction.     Kimm Ungaro JR,Daenerys Buttram L

## 2015-12-13 NOTE — H&P (View-Only) (Signed)
[facility] Reason for consult: Crohns with GI bleeding  Referring Physician: Hospitalist. PCP: Dr. Earle Gell.  Michael [name] is an 82 y.o. male.  HPI: he has had a long history of Crohn's disease dating back to 1966. In 1968 he had a terminal ileal section and had this revised in 2002 by Dr Margot Chimes. His last colonoscopy was several years ago and he had some stricturing in the rectum and diverticulosis as well. He has B-12 deficiency due to resection of his terminal ileum. He was recently seen with a early partial small bowel obstruction with CT scan suggesting Crohn's ileitis and was treated with steroids. He's been following up with Dr. Wynetta Emery was just seen in the office about 3 weeks ago and has been undergoing a steroid taper. He was started off on 40 mg a day and every week decreased by 5 mg. He was on the low residue diet. He has been feeling fairly well with no obstructive symptoms. They call last night and stated that he was having a large amount of bright red blood per rectum. He has had no abdominal pain or distention. His initial hemoglobin was 10.3 but has dropped to 9.4 this morning. He is on clear liquids.  Past Medical History:  Diagnosis Date  . Anemia   . Crohn's disease ([place])   . Glaucoma   . Inguinal hernia recurrent bilateral     Past Surgical History:  Procedure Laterality Date  . ABDOMINAL SURGERY    . INGUINAL HERNIA REPAIR Bilateral 05/21/2014   Procedure: OPEN BILATERAL INGUINAL HERNIA REPAIRS WITH MESH;  Surgeon: Donnie Mesa, MD;  Location: [facility];  Service: General;  Laterality: Bilateral;  . SMALL INTESTINE SURGERY  1968, 2001    related to Chrohn's disease  . TONSILLECTOMY      Family History  Problem Relation Age of Onset  . Deep vein thrombosis Mother   . Heart disease Father     Social History:  reports that he quit smoking about 39 years ago. He has never used smokeless tobacco. He reports that he drinks  alcohol. He reports that he does not use drugs.  Allergies: No Known Allergies  Medications; Prior to Admission medications   Medication Sig Start Date End Date Taking? Authorizing Provider  brimonidine (ALPHAGAN) 0.2 % ophthalmic solution Place 1 drop into both eyes 2 (two) times daily. 11/20/15  Yes Historical Provider, MD  Cyanocobalamin (VITAMIN B-12 IJ) Inject 1 mL as directed every 30 (thirty) days.    Yes Historical Provider, MD  dorzolamide-timolol (COSOPT) 22.3-6.8 MG/ML ophthalmic solution Place 1 drop into both eyes 2 (two) times daily. 11/05/15  Yes Historical Provider, MD  IRON PO Take 1 tablet by mouth 3 (three) times a week.    Yes Historical Provider, MD  latanoprost (XALATAN) 0.005 % ophthalmic solution Place 1 drop into the left eye at bedtime.    Yes Historical Provider, MD  prednisoLONE acetate (PRED FORTE) 1 % ophthalmic suspension Place 1 drop into the right eye 2 (two) times daily. 11/16/15  Yes Historical Provider, MD  predniSONE (DELTASONE) 10 MG tablet Take 25 mg by mouth daily. 11/18/15  Yes Historical Provider, MD  PROLENSA 0.07 % SOLN Place 1 drop into the right eye daily. 11/16/15  Yes Historical Provider, MD   . insulin aspart  0-15 Units Subcutaneous TID WC  . pantoprazole  40 mg Intravenous Q12H  . polyethylene glycol-electrolytes  4,000 mL Oral Once  . predniSONE  25 mg Oral  Q breakfast  . sodium chloride flush  3 mL Intravenous Q12H   PRN Meds acetaminophen **OR** acetaminophen, ondansetron **OR** ondansetron (ZOFRAN) IV Results for orders placed or performed during the hospital encounter of 12/11/15 (from the past 48 hour(s))  Comprehensive metabolic panel     Status: Abnormal   Collection Time: 12/11/15  9:58 PM  Result Value Ref Range   Sodium 139 135 - 145 mmol/L   Potassium 4.1 3.5 - 5.1 mmol/L   Chloride 105 101 - 111 mmol/L   CO2 25 22 - 32 mmol/L   Glucose, Bld 185 (H) 65 - 99 mg/dL   BUN 21 (H) 6 - 20 mg/dL   Creatinine, Ser 0.98 0.61 - 1.24  mg/dL   Calcium 8.1 (L) 8.9 - 10.3 mg/dL   Total Protein 5.9 (L) 6.5 - 8.1 g/dL   Albumin 2.9 (L) 3.5 - 5.0 g/dL   AST 20 15 - 41 U/L   ALT 21 17 - 63 U/L   Alkaline Phosphatase 48 38 - 126 U/L   Total Bilirubin 0.8 0.3 - 1.2 mg/dL   GFR calc non Af Amer >60 >60 mL/min   GFR calc Af Amer >60 >60 mL/min    Comment: (NOTE) The eGFR has been calculated using the CKD EPI equation. This calculation has not been validated in all clinical situations. eGFR's persistently <60 mL/min signify possible Chronic Kidney Disease.    Anion gap 9 5 - 15  CBC     Status: Abnormal   Collection Time: 12/11/15  9:58 PM  Result Value Ref Range   WBC 5.5 4.0 - 10.5 K/uL   RBC 3.20 (L) 4.22 - 5.81 MIL/uL   Hemoglobin 10.3 (L) 13.0 - 17.0 g/dL   HCT 30.8 (L) 39.0 - 52.0 %   MCV 96.3 78.0 - 100.0 fL   MCH 32.2 26.0 - 34.0 pg   MCHC 33.4 30.0 - 36.0 g/dL   RDW 13.8 11.5 - 15.5 %   Platelets 152 150 - 400 K/uL  Urinalysis, Routine w reflex microscopic (not at [facility])     Status: None   Collection Time: 12/11/15 10:46 PM  Result Value Ref Range   Color, Urine YELLOW YELLOW   APPearance CLEAR CLEAR   Specific Gravity, Urine 1.023 1.005 - 1.030   pH 6.5 5.0 - 8.0   Glucose, UA NEGATIVE NEGATIVE mg/dL   Hgb urine dipstick NEGATIVE NEGATIVE   Bilirubin Urine NEGATIVE NEGATIVE   Ketones, ur NEGATIVE NEGATIVE mg/dL   Protein, ur NEGATIVE NEGATIVE mg/dL   Nitrite NEGATIVE NEGATIVE   Leukocytes, UA NEGATIVE NEGATIVE    Comment: MICROSCOPIC NOT DONE ON URINES WITH NEGATIVE PROTEIN, BLOOD, LEUKOCYTES, NITRITE, OR GLUCOSE <1000 mg/dL.  Protime-INR     Status: None   Collection Time: 12/12/15  6:16 AM  Result Value Ref Range   Prothrombin Time 14.1 11.4 - 15.2 seconds   INR 1.08   Hemoglobin and hematocrit, blood     Status: Abnormal   Collection Time: 12/12/15  6:16 AM  Result Value Ref Range   Hemoglobin 9.4 (L) 13.0 - 17.0 g/dL   HCT 28.1 (L) 39.0 - 52.0 %  Glucose, capillary     Status: None    Collection Time: 12/12/15  7:43 AM  Result Value Ref Range   Glucose-Capillary 84 65 - 99 mg/dL    No results found.             Blood pressure 138/78, pulse 74, temperature 98.2 F (36.8 C), temperature  source Oral, resp. rate 18, height 5' 11"  (1.803 m), weight 81.6 kg (180 lb), SpO2 100 %.  Physical exam:   General--White male no acute distress sitting finishing clear liquid breakfast watching television ENT-- nonicteric Neck-- no lymphadenopathy Heart-- regular rate and rhythm without murmurs are gallops Lungs-- clear Abdomen-- soft nontender nondistended Psych-- alert oriented no signs of depression answers questions appropriately   Assessment: 1. G.I. bleed. This is an acute bleed manifested by bright red blood per rectum. He's had a significant drop hemoglobin. He has a history of diverticulosis from previous colonoscopy and this does seem more like a diverticular bleed. It is always possible it could be Crohn's disease but his Crohn's symptoms have been doing well with recent steroid treatment. 2. Crohn's disease. He has had 2 previous surgeries on the terminal ileum and recent scan shown ileal disease proximal to that. 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 He's been doing reasonably well in steroid taper.  Plan: 1. We will continue the patient on clear liquids go ahead and get him cleaned out and proceed tomorrow with colonoscopy. Feel that it's important to know if this is Crohn's ulceration or diverticulosis causing the bleeding. Would follow his hemoglobin transfuse is needed.   Neela Zecca JR,Efstathios Sawin L 12/12/2015, 8:31 AM   This note was created using voice recognition software and minor errors may Have occurred unintentionally. Pager: [phone] If no answer or after hours call [phone]

## 2015-12-13 NOTE — Op Note (Signed)
[facility] Patient Name: Michael [name] Procedure Date: 12/13/2015 MRN: [id number] Attending MD: Nancy Fetter Dr., MD Date of Birth: 1932-02-23 CSN: [id number] Age: 82 Admit Type: Inpatient Procedure:                Colonoscopy Indications:              Hematochezia, Crohn's disease of the small bowel                            and colon Providers:                Jeneen Rinks L. Lion Fernandez Dr., MD, Zenon Mayo, RN, Alfonso Patten, Technician Referring MD:              Medicines:                Fentanyl 37.5 micrograms IV, Midazolam 4 mg IV Complications:            No immediate complications. Estimated Blood Loss:     Estimated blood loss: none. Procedure:                Pre-Anesthesia Assessment:                           - Prior to the procedure, a History and Physical                            was performed, and patient medications and                            allergies were reviewed. The patient's tolerance of                            previous anesthesia was also reviewed. The risks                            and benefits of the procedure and the sedation                            options and risks were discussed with the patient.                            All questions were answered, and informed consent                            was obtained. Prior Anticoagulants: The patient has                            taken no previous anticoagulant or antiplatelet                            agents. ASA Grade Assessment: II - A patient with  mild systemic disease. After reviewing the risks                            and benefits, the patient was deemed in                            satisfactory condition to undergo the procedure.                           After obtaining informed consent, the colonoscope                            was passed under direct vision. Throughout the                            procedure, the  patient's blood pressure, pulse, and                            oxygen saturations were monitored continuously. The                            EC-[id number] ([id number]) scope was introduced through                            the anus and advanced to the the terminal ileum.                            The colonoscopy was performed without difficulty.                            The patient tolerated the procedure well. The                            quality of the bowel preparation was good. Surgical                            anastamosis were photographed. The terminal ileum                            was photographed. Scope In: 8:17:20 AM Scope Out: 8:41:28 AM Scope Withdrawal Time: 0 hours 12 minutes 42 seconds  Total Procedure Duration: 0 hours 24 minutes 8 seconds  Findings:      There is no endoscopic evidence of bleeding, diverticula, inflammation,       mass, polyps, ulcerations or tumor in the recto-sigmoid colon, in the       sigmoid colon, in the descending colon, in the transverse colon and in       the hepatic flexure.      There was evidence of a prior end-to-side ileo-colonic anastomosis in       the ascending colon. This was patent and was characterized by       ulceration. The anastomosis was traversed. Biopsies were obtained.      The terminal ileum contained multiple ulcers. No bleeding was present.       Biopsies were taken with a cold  forceps for histology.      Non-bleeding internal hemorrhoids were found. The hemorrhoids were small       and Grade I (internal hemorrhoids that do not prolapse). Impression:               - Patent end-to-side ileo-colonic anastomosis,                            characterized by ulceration.                           - Multiple ulcers in the terminal ileum. Biopsied.                            Probable active Crohn's                           - Non-bleeding internal hemorrhoids.                           - Hematochezia Moderate Sedation:       Moderate (conscious) sedation was administered by the endoscopy nurse       and supervised by the endoscopist. The following parameters were       monitored: oxygen saturation, heart rate, blood pressure, respiratory       rate, EKG, adequacy of pulmonary ventilation, and response to care. Recommendation:           - Return patient to hospital ward for ongoing care.                           - Repeat colonoscopy.                           - Low fiber diet.                           - Continue present medications. Procedure Code(s):        --- Professional ---                           [phone], Colonoscopy, flexible; with biopsy, single                            or multiple Diagnosis Code(s):        --- Professional ---                           K63.3, Ulcer of intestine                           Z98.0, Intestinal bypass and anastomosis status                           K50.80, Crohn's disease of both small and large                            intestine without complications  K92.1, Melena (includes Hematochezia)                           K64.0, First degree hemorrhoids CPT copyright 2016 [employer]. All rights reserved. The codes documented in this report are preliminary and upon coder review may  be revised to meet current compliance requirements. Nancy Fetter Dr., MD 12/13/2015 8:56:05 AM This report has been signed electronically. Number of Addenda: 0

## 2015-12-14 ENCOUNTER — Encounter (HOSPITAL_COMMUNITY): Payer: Self-pay | Admitting: Gastroenterology

## 2015-12-17 ENCOUNTER — Encounter (HOSPITAL_COMMUNITY): Payer: Self-pay | Admitting: Gastroenterology

## 2015-12-17 DIAGNOSIS — K5000 Crohn's disease of small intestine without complications: Secondary | ICD-10-CM | POA: Diagnosis not present

## 2015-12-30 DIAGNOSIS — L03115 Cellulitis of right lower limb: Secondary | ICD-10-CM | POA: Diagnosis not present

## 2015-12-30 DIAGNOSIS — T148XXA Other injury of unspecified body region, initial encounter: Secondary | ICD-10-CM | POA: Diagnosis not present

## 2015-12-30 DIAGNOSIS — L089 Local infection of the skin and subcutaneous tissue, unspecified: Secondary | ICD-10-CM | POA: Diagnosis not present

## 2016-01-01 DIAGNOSIS — E538 Deficiency of other specified B group vitamins: Secondary | ICD-10-CM | POA: Diagnosis not present

## 2016-01-01 DIAGNOSIS — L03115 Cellulitis of right lower limb: Secondary | ICD-10-CM | POA: Diagnosis not present

## 2016-01-01 DIAGNOSIS — K5000 Crohn's disease of small intestine without complications: Secondary | ICD-10-CM | POA: Diagnosis not present

## 2016-01-05 DIAGNOSIS — L308 Other specified dermatitis: Secondary | ICD-10-CM | POA: Diagnosis not present

## 2016-01-05 DIAGNOSIS — L989 Disorder of the skin and subcutaneous tissue, unspecified: Secondary | ICD-10-CM | POA: Diagnosis not present

## 2016-01-05 DIAGNOSIS — Z85828 Personal history of other malignant neoplasm of skin: Secondary | ICD-10-CM | POA: Diagnosis not present

## 2016-01-05 DIAGNOSIS — R21 Rash and other nonspecific skin eruption: Secondary | ICD-10-CM | POA: Diagnosis not present

## 2016-01-06 DIAGNOSIS — R509 Fever, unspecified: Secondary | ICD-10-CM | POA: Diagnosis not present

## 2016-01-06 DIAGNOSIS — R350 Frequency of micturition: Secondary | ICD-10-CM | POA: Diagnosis not present

## 2016-01-12 DIAGNOSIS — L03115 Cellulitis of right lower limb: Secondary | ICD-10-CM | POA: Diagnosis not present

## 2016-01-12 DIAGNOSIS — R5383 Other fatigue: Secondary | ICD-10-CM | POA: Diagnosis not present

## 2016-01-14 ENCOUNTER — Encounter (INDEPENDENT_AMBULATORY_CARE_PROVIDER_SITE_OTHER): Payer: PPO | Admitting: Ophthalmology

## 2016-01-14 DIAGNOSIS — K5000 Crohn's disease of small intestine without complications: Secondary | ICD-10-CM | POA: Diagnosis not present

## 2016-01-14 DIAGNOSIS — H353131 Nonexudative age-related macular degeneration, bilateral, early dry stage: Secondary | ICD-10-CM | POA: Diagnosis not present

## 2016-01-14 DIAGNOSIS — H43813 Vitreous degeneration, bilateral: Secondary | ICD-10-CM

## 2016-01-14 DIAGNOSIS — R197 Diarrhea, unspecified: Secondary | ICD-10-CM | POA: Diagnosis not present

## 2016-01-14 DIAGNOSIS — H59031 Cystoid macular edema following cataract surgery, right eye: Secondary | ICD-10-CM | POA: Diagnosis not present

## 2016-01-15 DIAGNOSIS — K5000 Crohn's disease of small intestine without complications: Secondary | ICD-10-CM | POA: Diagnosis not present

## 2016-01-19 DIAGNOSIS — L111 Transient acantholytic dermatosis [Grover]: Secondary | ICD-10-CM | POA: Diagnosis not present

## 2016-01-19 DIAGNOSIS — Z85828 Personal history of other malignant neoplasm of skin: Secondary | ICD-10-CM | POA: Diagnosis not present

## 2016-01-21 DIAGNOSIS — K5000 Crohn's disease of small intestine without complications: Secondary | ICD-10-CM | POA: Diagnosis not present

## 2016-02-03 DIAGNOSIS — L84 Corns and callosities: Secondary | ICD-10-CM | POA: Diagnosis not present

## 2016-02-03 DIAGNOSIS — Z85828 Personal history of other malignant neoplasm of skin: Secondary | ICD-10-CM | POA: Diagnosis not present

## 2016-02-03 DIAGNOSIS — L821 Other seborrheic keratosis: Secondary | ICD-10-CM | POA: Diagnosis not present

## 2016-02-05 DIAGNOSIS — H401133 Primary open-angle glaucoma, bilateral, severe stage: Secondary | ICD-10-CM | POA: Diagnosis not present

## 2016-02-11 DIAGNOSIS — K5000 Crohn's disease of small intestine without complications: Secondary | ICD-10-CM | POA: Diagnosis not present

## 2016-02-11 DIAGNOSIS — E538 Deficiency of other specified B group vitamins: Secondary | ICD-10-CM | POA: Diagnosis not present

## 2016-02-11 DIAGNOSIS — Z1389 Encounter for screening for other disorder: Secondary | ICD-10-CM | POA: Diagnosis not present

## 2016-02-11 DIAGNOSIS — Z92241 Personal history of systemic steroid therapy: Secondary | ICD-10-CM | POA: Diagnosis not present

## 2016-02-11 DIAGNOSIS — Z0000 Encounter for general adult medical examination without abnormal findings: Secondary | ICD-10-CM | POA: Diagnosis not present

## 2016-02-19 DIAGNOSIS — E538 Deficiency of other specified B group vitamins: Secondary | ICD-10-CM | POA: Diagnosis not present

## 2016-02-19 DIAGNOSIS — D649 Anemia, unspecified: Secondary | ICD-10-CM | POA: Diagnosis not present

## 2016-02-19 DIAGNOSIS — Z5181 Encounter for therapeutic drug level monitoring: Secondary | ICD-10-CM | POA: Diagnosis not present

## 2016-02-19 DIAGNOSIS — Z92241 Personal history of systemic steroid therapy: Secondary | ICD-10-CM | POA: Diagnosis not present

## 2016-02-19 DIAGNOSIS — E2740 Unspecified adrenocortical insufficiency: Secondary | ICD-10-CM | POA: Diagnosis not present

## 2016-03-03 ENCOUNTER — Encounter (INDEPENDENT_AMBULATORY_CARE_PROVIDER_SITE_OTHER): Payer: PPO | Admitting: Ophthalmology

## 2016-03-03 DIAGNOSIS — H43813 Vitreous degeneration, bilateral: Secondary | ICD-10-CM | POA: Diagnosis not present

## 2016-03-03 DIAGNOSIS — H35033 Hypertensive retinopathy, bilateral: Secondary | ICD-10-CM | POA: Diagnosis not present

## 2016-03-03 DIAGNOSIS — H59031 Cystoid macular edema following cataract surgery, right eye: Secondary | ICD-10-CM

## 2016-03-03 DIAGNOSIS — I10 Essential (primary) hypertension: Secondary | ICD-10-CM

## 2016-03-09 DIAGNOSIS — E2740 Unspecified adrenocortical insufficiency: Secondary | ICD-10-CM | POA: Diagnosis not present

## 2016-03-09 DIAGNOSIS — K5000 Crohn's disease of small intestine without complications: Secondary | ICD-10-CM | POA: Diagnosis not present

## 2016-03-09 DIAGNOSIS — E538 Deficiency of other specified B group vitamins: Secondary | ICD-10-CM | POA: Diagnosis not present

## 2016-03-11 DIAGNOSIS — R351 Nocturia: Secondary | ICD-10-CM | POA: Diagnosis not present

## 2016-03-11 DIAGNOSIS — N401 Enlarged prostate with lower urinary tract symptoms: Secondary | ICD-10-CM | POA: Diagnosis not present

## 2016-03-11 DIAGNOSIS — N3281 Overactive bladder: Secondary | ICD-10-CM | POA: Diagnosis not present

## 2016-03-23 DIAGNOSIS — Z5181 Encounter for therapeutic drug level monitoring: Secondary | ICD-10-CM | POA: Diagnosis not present

## 2016-03-23 DIAGNOSIS — Z92241 Personal history of systemic steroid therapy: Secondary | ICD-10-CM | POA: Diagnosis not present

## 2016-03-23 DIAGNOSIS — D649 Anemia, unspecified: Secondary | ICD-10-CM | POA: Diagnosis not present

## 2016-03-23 DIAGNOSIS — E2749 Other adrenocortical insufficiency: Secondary | ICD-10-CM | POA: Diagnosis not present

## 2016-04-07 DIAGNOSIS — Z85828 Personal history of other malignant neoplasm of skin: Secondary | ICD-10-CM | POA: Diagnosis not present

## 2016-04-07 DIAGNOSIS — L570 Actinic keratosis: Secondary | ICD-10-CM | POA: Diagnosis not present

## 2016-04-07 DIAGNOSIS — L84 Corns and callosities: Secondary | ICD-10-CM | POA: Diagnosis not present

## 2016-04-07 DIAGNOSIS — L821 Other seborrheic keratosis: Secondary | ICD-10-CM | POA: Diagnosis not present

## 2016-04-14 DIAGNOSIS — R351 Nocturia: Secondary | ICD-10-CM | POA: Diagnosis not present

## 2016-05-05 ENCOUNTER — Encounter (INDEPENDENT_AMBULATORY_CARE_PROVIDER_SITE_OTHER): Payer: PPO | Admitting: Ophthalmology

## 2016-05-05 DIAGNOSIS — H35033 Hypertensive retinopathy, bilateral: Secondary | ICD-10-CM

## 2016-05-05 DIAGNOSIS — I10 Essential (primary) hypertension: Secondary | ICD-10-CM

## 2016-05-05 DIAGNOSIS — H43813 Vitreous degeneration, bilateral: Secondary | ICD-10-CM

## 2016-05-05 DIAGNOSIS — H353132 Nonexudative age-related macular degeneration, bilateral, intermediate dry stage: Secondary | ICD-10-CM

## 2016-05-05 DIAGNOSIS — H59031 Cystoid macular edema following cataract surgery, right eye: Secondary | ICD-10-CM | POA: Diagnosis not present

## 2016-05-06 DIAGNOSIS — H401133 Primary open-angle glaucoma, bilateral, severe stage: Secondary | ICD-10-CM | POA: Diagnosis not present

## 2016-05-12 DIAGNOSIS — H02831 Dermatochalasis of right upper eyelid: Secondary | ICD-10-CM | POA: Diagnosis not present

## 2016-05-12 DIAGNOSIS — H401133 Primary open-angle glaucoma, bilateral, severe stage: Secondary | ICD-10-CM | POA: Diagnosis not present

## 2016-05-12 DIAGNOSIS — Z961 Presence of intraocular lens: Secondary | ICD-10-CM | POA: Diagnosis not present

## 2016-05-12 DIAGNOSIS — H02834 Dermatochalasis of left upper eyelid: Secondary | ICD-10-CM | POA: Diagnosis not present

## 2016-05-16 ENCOUNTER — Emergency Department (HOSPITAL_BASED_OUTPATIENT_CLINIC_OR_DEPARTMENT_OTHER)
Admission: EM | Admit: 2016-05-16 | Discharge: 2016-05-17 | Disposition: A | Discharge status: Home / Self Care | Payer: PPO | Source: Home / Self Care | Attending: Emergency Medicine | Admitting: Emergency Medicine

## 2016-05-16 ENCOUNTER — Encounter (HOSPITAL_BASED_OUTPATIENT_CLINIC_OR_DEPARTMENT_OTHER): Payer: Self-pay | Admitting: *Deleted

## 2016-05-16 ENCOUNTER — Ambulatory Visit
Admission: RE | Admit: 2016-05-16 | Discharge: 2016-05-16 | Disposition: A | Discharge status: Home / Self Care | Payer: PPO | Source: Ambulatory Visit | Attending: Internal Medicine | Admitting: Internal Medicine

## 2016-05-16 ENCOUNTER — Other Ambulatory Visit: Payer: Self-pay | Admitting: Internal Medicine

## 2016-05-16 DIAGNOSIS — K5090 Crohn's disease, unspecified, without complications: Secondary | ICD-10-CM

## 2016-05-16 DIAGNOSIS — K5669 Other partial intestinal obstruction: Secondary | ICD-10-CM | POA: Diagnosis not present

## 2016-05-16 DIAGNOSIS — H401113 Primary open-angle glaucoma, right eye, severe stage: Secondary | ICD-10-CM | POA: Diagnosis not present

## 2016-05-16 DIAGNOSIS — Z79899 Other long term (current) drug therapy: Secondary | ICD-10-CM

## 2016-05-16 DIAGNOSIS — K6289 Other specified diseases of anus and rectum: Secondary | ICD-10-CM | POA: Diagnosis not present

## 2016-05-16 DIAGNOSIS — K5641 Fecal impaction: Secondary | ICD-10-CM | POA: Insufficient documentation

## 2016-05-16 DIAGNOSIS — Z87891 Personal history of nicotine dependence: Secondary | ICD-10-CM | POA: Insufficient documentation

## 2016-05-16 HISTORY — DX: Retinal ischemia: H35.82

## 2016-05-16 LAB — CBC WITH DIFFERENTIAL/PLATELET
BASOS ABS: 0 10*3/uL (ref 0.0–0.1)
BASOS PCT: 0 %
EOS ABS: 0.3 10*3/uL (ref 0.0–0.7)
Eosinophils Relative: 3 %
HCT: 33.3 % — ABNORMAL LOW (ref 39.0–52.0)
HEMOGLOBIN: 11.7 g/dL — AB (ref 13.0–17.0)
Lymphocytes Relative: 21 %
Lymphs Abs: 2.1 10*3/uL (ref 0.7–4.0)
MCH: 32 pg (ref 26.0–34.0)
MCHC: 35.1 g/dL (ref 30.0–36.0)
MCV: 91 fL (ref 78.0–100.0)
MONOS PCT: 12 %
Monocytes Absolute: 1.1 10*3/uL — ABNORMAL HIGH (ref 0.1–1.0)
NEUTROS PCT: 64 %
Neutro Abs: 6.3 10*3/uL (ref 1.7–7.7)
Platelets: 193 10*3/uL (ref 150–400)
RBC: 3.66 MIL/uL — ABNORMAL LOW (ref 4.22–5.81)
RDW: 13.3 % (ref 11.5–15.5)
WBC: 9.8 10*3/uL (ref 4.0–10.5)

## 2016-05-16 LAB — BASIC METABOLIC PANEL
ANION GAP: 6 (ref 5–15)
BUN: 15 mg/dL (ref 6–20)
CALCIUM: 8.7 mg/dL — AB (ref 8.9–10.3)
CHLORIDE: 107 mmol/L (ref 101–111)
CO2: 24 mmol/L (ref 22–32)
CREATININE: 0.85 mg/dL (ref 0.61–1.24)
Glucose, Bld: 118 mg/dL — ABNORMAL HIGH (ref 65–99)
POTASSIUM: 3.3 mmol/L — AB (ref 3.5–5.1)
Sodium: 137 mmol/L (ref 135–145)

## 2016-05-16 MED ORDER — LIDOCAINE HCL 2 % EX GEL
1.0000 "application " | Freq: Once | CUTANEOUS | Status: AC
  Administered 2016-05-16: 1 via TOPICAL
  Filled 2016-05-16: qty 20

## 2016-05-16 MED ORDER — FENTANYL CITRATE (PF) 100 MCG/2ML IJ SOLN
50.0000 ug | Freq: Once | INTRAMUSCULAR | Status: AC
  Administered 2016-05-16: 50 ug via INTRAVENOUS
  Filled 2016-05-16: qty 2

## 2016-05-16 NOTE — ED Provider Notes (Signed)
[facility] DEPT [facility] Provider Note   CSN: [id number] Arrival date & time: 05/16/16  2234  By signing my name below, I, Theresia Bough, attest that this documentation has been prepared under the direction and in the presence of Sherwood Gambler, MD. Electronically Signed: Theresia Bough, ED Scribe. 05/16/16. 11:13 PM.   History   Chief Complaint Chief Complaint  Patient presents with  . Rectal Pain   The history is provided by the patient and a relative. No language interpreter was used.    HPI Comments: Michael [name] is a 83 y.o. male who presents to the Emergency Department complaining of constant gradually worsening rectal pain onset 3-4 days ago. Pt had eye surgery this morning for increased pressure. Went to his PCP this morning due to the pain and only small amounts of loose stool coming out. Had an X-Ray and blood work done. Pt tried hydrocortisone acetate suppository from his doctor with no relief. Pt reports associated symptoms of diarrhea, constipation, blood with wiping, cramping. Pt denies abdominal swelling, abdominal pain, vomiting, or fever. Never had this before. Feels pressure in his rectum. Was on diamox for the eye pressure and thinks this may have caused constipation.  Past Medical History:  Diagnosis Date  . Anemia   . Crohn's disease ([place])   . Glaucoma   . Inguinal hernia recurrent bilateral   . Retinal ischemia     Patient Active Problem List   Diagnosis Date Noted  . Glaucoma 12/12/2015  . Crohn's disease ([place]) 12/12/2015  . Anemia 12/12/2015  . Rectal bleeding 12/11/2015  . 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 ABDOMINAL SURGERY    . COLONOSCOPY N/A 12/13/2015   Procedure: COLONOSCOPY;  Surgeon: Laurence Spates, MD;  Location: [facility];  Service: Endoscopy;  Laterality: N/A;  . INGUINAL HERNIA REPAIR Bilateral 05/21/2014   Procedure: OPEN BILATERAL INGUINAL HERNIA REPAIRS WITH MESH;  Surgeon:  Donnie Mesa, MD;  Location: [facility];  Service: General;  Laterality: Bilateral;  . SMALL INTESTINE SURGERY  1968, 2001    related to Chrohn's disease  . TONSILLECTOMY        Home Medications    Prior to Admission medications   Medication Sig Start Date End Date Taking? Authorizing Provider  brimonidine (ALPHAGAN) 0.2 % ophthalmic solution Place 1 drop into both eyes 2 (two) times daily. 11/20/15  Yes [provider]  Cyanocobalamin (VITAMIN B-12 IJ) Inject 1 mL as directed every 30 (thirty) days.    Yes [provider]  dorzolamide-timolol (COSOPT) 22.3-6.8 MG/ML ophthalmic solution Place 1 drop into both eyes 2 (two) times daily. 11/05/15  Yes [provider]  hydrocortisone (CORTEF) 10 MG tablet Take 10 mg by mouth daily.   Yes [provider]  IRON PO Take 1 tablet by mouth 3 (three) times a week.    Yes [provider]  latanoprost (XALATAN) 0.005 % ophthalmic solution Place 1 drop into the left eye at bedtime.    Yes [provider]  oxybutynin (DITROPAN) 5 MG tablet Take 5 mg by mouth 3 (three) times daily.   Yes [provider]  pilocarpine (PILOCAR) 1 % ophthalmic solution 1 drop 4 (four) times daily.   Yes [provider]  prednisoLONE acetate (PRED FORTE) 1 % ophthalmic suspension Place 1 drop into the right eye 2 (two) times daily. 11/16/15   [provider]  predniSONE (DELTASONE) 10 MG tablet Take 3 tablets (30 mg total)  by mouth daily. 12/13/15   Debbe Odea, MD  PROLENSA 0.07 % SOLN Place 1 drop into the right eye daily. 11/16/15   [provider]  tamsulosin (FLOMAX) 0.4 MG CAPS capsule Take 1 capsule (0.4 mg total) by mouth daily after supper. 12/13/15   Debbe Odea, MD  zolpidem (AMBIEN) 5 MG tablet Take 1 tablet (5 mg total) by mouth at bedtime as needed for sleep. 12/13/15   Debbe Odea, MD    Family History Family History  Problem Relation Age of Onset  .  Deep vein thrombosis Mother   . Heart disease Father     Social History Social History  Substance Use Topics  . Smoking status: Former Smoker    Quit date: 04/01/1976  . Smokeless tobacco: Never Used  . Alcohol use Yes     Comment: 2-3  glass wine  weekly  1-2 cans beer/ week     Allergies   Patient has no known allergies.   Review of Systems Review of Systems  Constitutional: Negative for fever.  Gastrointestinal: Positive for anal bleeding, constipation, diarrhea and rectal pain. Negative for abdominal distention, abdominal pain, blood in stool and vomiting.  All other systems reviewed and are negative.    Physical Exam Updated Vital Signs BP (!) 156/95 (BP Location: Right Arm)   Pulse 99   Temp 97.7 F (36.5 C) (Oral)   Resp 20   Ht 5' 11"  (1.803 m)   Wt 177 lb (80.3 kg)   SpO2 94%   BMI 24.69 kg/m   Physical Exam  Constitutional: He is oriented to person, place, and time. He appears well-developed and well-nourished.  HENT:  Head: Normocephalic and atraumatic.  Right Ear: External ear normal.  Left Ear: External ear normal.  Nose: Nose normal.  Eyes: Right eye exhibits no discharge. Left eye exhibits no discharge.  Patch over right eye  Neck: Neck supple.  Cardiovascular: Normal rate, regular rhythm and normal heart sounds.   Pulmonary/Chest: Effort normal and breath sounds normal.  Abdominal: Soft. He exhibits no distension. There is no tenderness.  Genitourinary:  Genitourinary Comments: No obvious anal fissures or hemorrhoids. Generalized rectal soreness with fecal impaction. No gross blood  Neurological: He is alert and oriented to person, place, and time.  Skin: Skin is warm and dry.  Nursing note and vitals reviewed.    ED Treatments / Results  DIAGNOSTIC STUDIES: Oxygen Saturation is 94% on RA, normal by my interpretation.   COORDINATION OF CARE: 11:13 PM-Discussed next steps with pt. Pt verbalized understanding and is agreeable with the  plan.   Labs (all labs ordered are listed, but only abnormal results are displayed) Labs Reviewed  BASIC METABOLIC PANEL - Abnormal; Notable for the following:       Result Value   Potassium 3.3 (*)    Glucose, Bld 118 (*)    Calcium 8.7 (*)    All other components within normal limits  CBC WITH DIFFERENTIAL/PLATELET - Abnormal; Notable for the following:    RBC 3.66 (*)    Hemoglobin 11.7 (*)    HCT 33.3 (*)    Monocytes Absolute 1.1 (*)    All other components within normal limits    EKG  EKG Interpretation None       Radiology Dg Abd 2 Views  Result Date: 05/16/2016 CLINICAL DATA:  History of Crohn's disease. No current abdominal complaints. EXAM: ABDOMEN - 2 VIEW COMPARISON:  Chest in two views abdomen 11/18/2015 and CT abdomen and pelvis  04/06/2015. FINDINGS: There is no free intraperitoneal air. The bowel gas pattern is nonobstructive. A large volume of stool is seen in the descending and rectosigmoid colon. Suture material and surgical clips the right abdomen are identified. IMPRESSION: No acute abnormality. Large stool burden descending and rectosigmoid colon. Electronically Signed   By: Inge Rise M.D.   On: 05/16/2016 11:23    Procedures Procedures (including critical care time)  Medications Ordered in ED Medications - No data to display   Initial Impression / Assessment and Plan / ED Course  I have reviewed the triage vital signs and the nursing notes.  Pertinent labs & imaging results that were available during my care of the patient were reviewed by me and considered in my medical decision making (see chart for details).     Presentation is consistent with a fecal impaction. Abdominal exam is benign. There is no vomiting. I attempted manual disimpaction with lidocaine jelly and IV fentanyl but the patient still did not tolerate it. Tried enemas and some magnesium citrate but also the patient has not had a significant bowel movement. He has had some  small bowel movements but still complaining of the impaction. He is appearing better. I discussed options with patient and wife. At this point they do not want to be admitted for a cleanout and would rather try further things at home. I have offered GoLYTELY as well to see if this will help clean him out. His potassium was repleted in the ED. Discussed strict return precautions and that if symptoms are not improving or are worsening to come back here or [facility] for admission and cleanout.  Final Clinical Impressions(s) / ED Diagnoses   Final diagnoses:  Fecal impaction in rectum [facility])    New Prescriptions New Prescriptions   No medications on file   I personally performed the services described in this documentation, which was scribed in my presence. The recorded information has been reviewed and is accurate.     Sherwood Gambler, MD 05/17/16 [phone]

## 2016-05-16 NOTE — ED Triage Notes (Signed)
Rectal soreness. Incontinent of stool. He had eye surgery today.

## 2016-05-17 ENCOUNTER — Emergency Department (HOSPITAL_COMMUNITY)
Admission: EM | Admit: 2016-05-17 | Discharge: 2016-05-17 | Disposition: A | Discharge status: Home / Self Care | Payer: PPO | Attending: Emergency Medicine | Admitting: Emergency Medicine

## 2016-05-17 ENCOUNTER — Encounter (HOSPITAL_COMMUNITY): Payer: Self-pay

## 2016-05-17 ENCOUNTER — Emergency Department (HOSPITAL_COMMUNITY): Payer: PPO

## 2016-05-17 DIAGNOSIS — K5641 Fecal impaction: Secondary | ICD-10-CM | POA: Insufficient documentation

## 2016-05-17 DIAGNOSIS — K6289 Other specified diseases of anus and rectum: Secondary | ICD-10-CM | POA: Diagnosis not present

## 2016-05-17 DIAGNOSIS — Z87891 Personal history of nicotine dependence: Secondary | ICD-10-CM | POA: Insufficient documentation

## 2016-05-17 LAB — COMPREHENSIVE METABOLIC PANEL
ALK PHOS: 54 U/L (ref 38–126)
ALT: 13 U/L — AB (ref 17–63)
AST: 18 U/L (ref 15–41)
Albumin: 4.1 g/dL (ref 3.5–5.0)
Anion gap: 9 (ref 5–15)
BUN: 12 mg/dL (ref 6–20)
CHLORIDE: 103 mmol/L (ref 101–111)
CO2: 24 mmol/L (ref 22–32)
CREATININE: 0.97 mg/dL (ref 0.61–1.24)
Calcium: 8.8 mg/dL — ABNORMAL LOW (ref 8.9–10.3)
GFR calc Af Amer: >60 mL/min (ref >60)
Glucose, Bld: 115 mg/dL — ABNORMAL HIGH (ref 65–99)
Potassium: 3.3 mmol/L — ABNORMAL LOW (ref 3.5–5.1)
Sodium: 136 mmol/L (ref 135–145)
Total Bilirubin: 2.4 mg/dL — ABNORMAL HIGH (ref 0.3–1.2)
Total Protein: 7.3 g/dL (ref 6.5–8.1)

## 2016-05-17 LAB — CBC WITH DIFFERENTIAL/PLATELET
Basophils Absolute: 0 10*3/uL (ref 0.0–0.1)
Basophils Relative: 0 %
Eosinophils Absolute: 0.1 10*3/uL (ref 0.0–0.7)
Eosinophils Relative: 1 %
HEMATOCRIT: 36.1 % — AB (ref 39.0–52.0)
HEMOGLOBIN: 12.7 g/dL — AB (ref 13.0–17.0)
LYMPHS ABS: 2.6 10*3/uL (ref 0.7–4.0)
LYMPHS PCT: 24 %
MCH: 31.8 pg (ref 26.0–34.0)
MCHC: 35.2 g/dL (ref 30.0–36.0)
MCV: 90.3 fL (ref 78.0–100.0)
Monocytes Absolute: 1.2 10*3/uL — ABNORMAL HIGH (ref 0.1–1.0)
Monocytes Relative: 11 %
NEUTROS ABS: 7 10*3/uL (ref 1.7–7.7)
Neutrophils Relative %: 64 %
PLATELETS: 202 10*3/uL (ref 150–400)
RBC: 4 MIL/uL — AB (ref 4.22–5.81)
RDW: 13.4 % (ref 11.5–15.5)
WBC: 10.9 10*3/uL — ABNORMAL HIGH (ref 4.0–10.5)

## 2016-05-17 LAB — LIPASE, BLOOD: LIPASE: 17 U/L (ref 11–51)

## 2016-05-17 MED ORDER — PEG 3350-KCL-NABCB-NACL-NASULF 236 G PO SOLR: 4.0000 L | Freq: Once | ORAL | 0 refills | Status: AC

## 2016-05-17 MED ORDER — POTASSIUM CHLORIDE CRYS ER 20 MEQ PO TBCR
40.0000 meq | EXTENDED_RELEASE_TABLET | Freq: Once | ORAL | Status: AC
  Administered 2016-05-17: 40 meq via ORAL
  Filled 2016-05-17: qty 2

## 2016-05-17 MED ORDER — SODIUM CHLORIDE 0.9 % IV BOLUS (SEPSIS)
1000.0000 mL | Freq: Once | INTRAVENOUS | Status: AC
  Administered 2016-05-17: 1000 mL via INTRAVENOUS

## 2016-05-17 MED ORDER — FLEET ENEMA 7-19 GM/118ML RE ENEM
1.0000 | ENEMA | Freq: Once | RECTAL | Status: AC
  Administered 2016-05-17: 1 via RECTAL
  Filled 2016-05-17: qty 1

## 2016-05-17 MED ORDER — MAGNESIUM CITRATE PO SOLN
1.0000 | Freq: Once | ORAL | Status: AC
  Administered 2016-05-17: 1 via ORAL
  Filled 2016-05-17: qty 296

## 2016-05-17 MED ORDER — FENTANYL CITRATE (PF) 100 MCG/2ML IJ SOLN: 50.0000 ug | INTRAMUSCULAR | Status: DC | PRN

## 2016-05-17 MED ORDER — FENTANYL CITRATE (PF) 100 MCG/2ML IJ SOLN
INTRAMUSCULAR | Status: AC
  Filled 2016-05-17: qty 2

## 2016-05-17 MED ORDER — IOPAMIDOL (ISOVUE-300) INJECTION 61%
INTRAVENOUS | Status: AC
  Administered 2016-05-17: 100 mL
  Filled 2016-05-17: qty 100

## 2016-05-17 MED ORDER — POLYETHYLENE GLYCOL 3350 17 GM/SCOOP PO POWD
1.0000 | Freq: Once | ORAL | Status: DC
  Filled 2016-05-17: qty 255

## 2016-05-17 MED ORDER — MINERAL OIL RE ENEM
1.0000 | ENEMA | Freq: Once | RECTAL | Status: AC
  Administered 2016-05-17: 1 via RECTAL
  Filled 2016-05-17: qty 1

## 2016-05-17 NOTE — ED Notes (Signed)
Patient is alert and oriented x3.  He was given DC instructions and follow up visit instructions.  Patient gave verbal understanding.  He was DC ambulatory under his own power to home.  V/S stable.  He was not showing any signs of distress on DC

## 2016-05-17 NOTE — ED Notes (Signed)
MD at bedside.  Patient continues to ask for pain medication after being told it could worsen condition.

## 2016-05-17 NOTE — Discharge Instructions (Signed)
TAKE 8 CAPFULS OF MIRALAX IN A 32 OUNCE GATORADE AND DRINK THE WHOLE BEVERAGE

## 2016-05-17 NOTE — ED Triage Notes (Signed)
Per PCP, states was seen at [facility] for constipation-was disimpacted and received enemas with little success-states testing shows possible bowel obstruction

## 2016-05-17 NOTE — ED Notes (Signed)
Pt tolerated soap suds enema without much success.  Pt had a very small BM.

## 2016-05-17 NOTE — ED Provider Notes (Signed)
[facility] DEPT [facility] Provider Note   CSN: [id number] Arrival date & time: 05/17/16  1017     History   Chief Complaint Chief Complaint  Patient presents with  . Fecal Impaction    HPI Michael [name] is a 83 y.o. male.  83 yo M with a chief complaint of rectal spasms. Going on for the past week or so. No significant bowel movement in the same time. Patient is eating and drinking less. Denies fevers or vomiting. Was seen last night and diagnosed with a fecal impaction. Patient states he had been taking Acetazolamide for IOP, this is causing him to have some diarrhea. The family started taking things to help stop that and it resulted in constipation. The patient was seen by their family physician who did a plain film that was concerning for acute bowel obstruction. Sent to the ED.   The history is provided by the patient.  Illness  This is a new problem. The current episode started more than 2 days ago. The problem occurs constantly. The problem has not changed since onset.Pertinent negatives include no chest pain, no abdominal pain, no headaches and no shortness of breath. Nothing aggravates the symptoms. Nothing relieves the symptoms. He has tried nothing for the symptoms. The treatment provided no relief.    Past Medical History:  Diagnosis Date  . Anemia   . Crohn's disease ([place])   . Glaucoma   . Inguinal hernia recurrent bilateral   . Retinal ischemia     Patient Active Problem List   Diagnosis Date Noted  . Glaucoma 12/12/2015  . Crohn's disease ([place]) 12/12/2015  . Anemia 12/12/2015  . Rectal bleeding 12/11/2015  . 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 ABDOMINAL SURGERY    . COLONOSCOPY N/A 12/13/2015   Procedure: COLONOSCOPY;  Surgeon: Laurence Spates, MD;  Location: [facility];  Service: Endoscopy;  Laterality: N/A;  . INGUINAL HERNIA REPAIR Bilateral 05/21/2014   Procedure: OPEN BILATERAL INGUINAL HERNIA REPAIRS  WITH MESH;  Surgeon: Donnie Mesa, MD;  Location: [facility];  Service: General;  Laterality: Bilateral;  . SMALL INTESTINE SURGERY  1968, 2001    related to Chrohn's disease  . TONSILLECTOMY         Home Medications    Prior to Admission medications   Medication Sig Start Date End Date Taking? Authorizing Provider  brimonidine (ALPHAGAN) 0.2 % ophthalmic solution Place 1 drop into both eyes 2 (two) times daily. 11/20/15  Yes [provider]  Cyanocobalamin (VITAMIN B-12 IJ) Inject 1 mL as directed every 30 (thirty) days.    Yes [provider]  hydrocortisone (CORTEF) 10 MG tablet Take 10 mg by mouth daily.   Yes [provider]  IRON PO Take 1 tablet by mouth 3 (three) times a week.    Yes [provider]  latanoprost (XALATAN) 0.005 % ophthalmic solution Place 1 drop into the left eye at bedtime.    Yes [provider]  oxybutynin (DITROPAN) 5 MG tablet Take 5 mg by mouth 3 (three) times daily.   Yes [provider]  prednisoLONE acetate (PRED FORTE) 1 % ophthalmic suspension Place 1 drop into the right eye 2 (two) times daily. 11/16/15  Yes [provider]  dorzolamide-timolol (COSOPT) 22.3-6.8 MG/ML ophthalmic solution Place 1 drop into both eyes 2 (two) times daily. 11/05/15   [provider]  predniSONE (DELTASONE) 10 MG tablet Take 3 tablets (30 mg total)  by mouth daily. Patient not taking: Reported on 05/17/2016 12/13/15   Debbe Odea, MD  tamsulosin (FLOMAX) 0.4 MG CAPS capsule Take 1 capsule (0.4 mg total) by mouth daily after supper. Patient not taking: Reported on 05/17/2016 12/13/15   Debbe Odea, MD  zolpidem (AMBIEN) 5 MG tablet Take 1 tablet (5 mg total) by mouth at bedtime as needed for sleep. Patient not taking: Reported on 05/17/2016 12/13/15   Debbe Odea, MD    Family History Family History  Problem Relation Age of Onset  . Deep vein thrombosis Mother   . Heart disease Father      Social History Social History  Substance Use Topics  . Smoking status: Former Smoker    Quit date: 04/01/1976  . Smokeless tobacco: Never Used  . Alcohol use Yes     Comment: 2-3  glass wine  weekly  1-2 cans beer/ week     Allergies   Patient has no known allergies.   Review of Systems Review of Systems  Constitutional: Negative for chills and fever.  HENT: Negative for congestion and facial swelling.   Eyes: Negative for discharge and visual disturbance.  Respiratory: Negative for shortness of breath.   Cardiovascular: Negative for chest pain and palpitations.  Gastrointestinal: Positive for constipation and nausea. Negative for abdominal pain, diarrhea and vomiting.  Musculoskeletal: Negative for arthralgias and myalgias.  Skin: Negative for color change and rash.  Neurological: Negative for tremors, syncope and headaches.  Psychiatric/Behavioral: Negative for confusion and dysphoric mood.     Physical Exam Updated Vital Signs BP (!) 139/91 (BP Location: Right Arm)   Pulse 94   Temp 97.6 F (36.4 C) (Oral)   Resp 16   Ht _0  (1.803 m)   Wt 177 lb (80.3 kg)   SpO2 97%   BMI 24.69 kg/m   Physical Exam  Constitutional: He is oriented to person, place, and time. He appears well-developed and well-nourished.  HENT:  Head: Normocephalic and atraumatic.  Eyes: EOM are normal. Pupils are equal, round, and reactive to light.  Neck: Normal range of motion. Neck supple. No JVD present.  Cardiovascular: Normal rate and regular rhythm.  Exam reveals no gallop and no friction rub.   No murmur heard. Pulmonary/Chest: No respiratory distress. He has no wheezes.  Abdominal: He exhibits no distension and no mass. There is no tenderness. There is no rebound and no guarding.  Genitourinary:  Genitourinary Comments: No noted hemorrhoids or active bleeding at the rectum  Musculoskeletal: Normal range of motion.  Neurological: He is alert and oriented to person, place, and  time.  Skin: No rash noted. No pallor.  Psychiatric: He has a normal mood and affect. His behavior is normal.  Nursing note and vitals reviewed.    ED Treatments / Results  Labs (all labs ordered are listed, but only abnormal results are displayed) Labs Reviewed  CBC WITH DIFFERENTIAL/PLATELET - Abnormal; Notable for the following:       Result Value   WBC 10.9 (*)    RBC 4.00 (*)    Hemoglobin 12.7 (*)    HCT 36.1 (*)    Monocytes Absolute 1.2 (*)    All other components within normal limits  COMPREHENSIVE METABOLIC PANEL - Abnormal; Notable for the following:    Potassium 3.3 (*)    Glucose, Bld 115 (*)    Calcium 8.8 (*)    ALT 13 (*)    Total Bilirubin 2.4 (*)    All other components within  normal limits  LIPASE, BLOOD    EKG  EKG Interpretation None       Radiology Ct Abdomen Pelvis W Contrast  Result Date: 05/17/2016 CLINICAL DATA:  Rectal pain for the past 4 days. Clinical concern for ileus or fecal impaction. Bright red blood per rectum. Recurrent bilateral inguinal hernias. Crohn's disease. EXAM: CT ABDOMEN AND PELVIS WITH CONTRAST TECHNIQUE: Multidetector CT imaging of the abdomen and pelvis was performed using the standard protocol following bolus administration of intravenous contrast. CONTRAST:  138m ISOVUE-300 IOPAMIDOL (ISOVUE-300) INJECTION 61% COMPARISON:  Radiographs obtained yesterday FINDINGS: Lower chest: Minimal bilateral dependent atelectasis. Hepatobiliary: Mild diffuse low density of the liver relative to the spleen. Distended, normal appearing gallbladder. Pancreas: Unremarkable. No pancreatic ductal dilatation or surrounding inflammatory changes. Spleen: Normal in size without focal abnormality. Adrenals/Urinary Tract: Multiple bilateral renal cysts. Normal appearing adrenal glands, ureters and urinary bladder. No urinary tract calculi or hydronephrosis. Stomach/Bowel: Small sliding hiatal hernia. Large amount of stool distending the rectum and distal  sigmoid colon with mild perirectal edema. Small bowel to right colon anastomosis. Mildly prominent proximal small bowel loops without abnormal dilatation. Mild diffuse, enhancing wall thickening involving a pelvic loop of ileum, for example image number 54 of series 2. Vascular/Lymphatic: Mild atheromatous arterial calcifications without aneurysm. No enlarged lymph nodes. Reproductive: No significant prostatic enlargement. Other: Small left inguinal hernia containing fat. Bilateral hydroceles, larger on the right. 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 Lumbar and lower thoracic spine degenerative changes. Approximately 30% T12 superior endplate compression deformity without significant change. No acute fracture lines. Minimal bony retropulsion. IMPRESSION: 1. Continued mild diffuse wall thickening and enhancement involving a pelvic loop of ileum, compatible with stable changes of Crohn's disease. 2. Large amount of stool distending the rectum and distal sigmoid colon with mild perirectal edema. 3. Mild diffuse hepatic steatosis. 4. Bilateral hydroceles, larger on the right. 5. 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 6. Stable distended gallbladder without evidence cholecystitis. Electronically Signed   By: SClaudie ReveringM.D.   On: 05/17/2016 14:41   Dg Abd 2 Views  Result Date: 05/16/2016 CLINICAL DATA:  History of Crohn's disease. No current abdominal complaints. EXAM: ABDOMEN - 2 VIEW COMPARISON:  Chest in two views abdomen 11/18/2015 and CT abdomen and pelvis 04/06/2015. FINDINGS: There is no free intraperitoneal air. The bowel gas pattern is nonobstructive. A large volume of stool is seen in the descending and rectosigmoid colon. Suture material and surgical clips the right abdomen are identified. IMPRESSION: No acute abnormality. Large stool burden descending and rectosigmoid colon. Electronically Signed   By: TInge RiseM.D.   On: 05/16/2016  11:23    Procedures Procedures (including critical care time)  Medications Ordered in ED Medications  sodium chloride 0.9 % bolus 1,000 mL (0 mLs Intravenous Stopped 05/17/16 1626)  iopamidol (ISOVUE-300) 61 % injection (100 mLs  Contrast Given 05/17/16 1407)  sodium phosphate (FLEET) 7-19 GM/118ML enema 1 enema (1 enema Rectal Given 05/17/16 1607)     Initial Impression / Assessment and Plan / ED Course  I have reviewed the triage vital signs and the nursing notes.  Pertinent labs & imaging results that were available during my care of the patient were reviewed by me and considered in my medical decision making (see chart for details).     83yo M With a chief complaint of a fecal impaction. Patient was having severe pain and declining digital removal at this time.  Will obtain a CT scan to evaluate for possible obstruction.   CT scan with fecal impaction. No signs of small bowel obstruction. Patient has been able to tolerate by mouth well in the ED. I discussed inpatient and outpatient options including that this is normally handled as an outpatient. Patient was fairly insistent that he needed to come in to the hospital because you never dealt with this before. I discussed the case with Dr. Lonny Prude, he did not feel that the patient met criteria for an inpatient stay. I discussed the results with the patient and he agreed for an outpatient cleanout. Strict return precautions.  :  I have discussed the diagnosis/risks/treatment options with the patient and family and believe the pt to be eligible for discharge home to follow-up with PCP. We also discussed returning to the ED immediately if new or worsening sx occur. We discussed the sx which are most concerning (e.g., sudden worsening pain, fever, inability to tolerate by mouth) that necessitate immediate return. Medications administered to the patient during their visit and any new prescriptions provided to the patient are listed  below.  Medications given during this visit Medications  sodium chloride 0.9 % bolus 1,000 mL (0 mLs Intravenous Stopped 05/17/16 1626)  iopamidol (ISOVUE-300) 61 % injection (100 mLs  Contrast Given 05/17/16 1407)  sodium phosphate (FLEET) 7-19 GM/118ML enema 1 enema (1 enema Rectal Given 05/17/16 1607)     The patient appears reasonably screen and/or stabilized for discharge and I doubt any other medical condition or other [facility] requiring further screening, evaluation, or treatment in the ED at this time prior to discharge.   Final Clinical Impressions(s) / ED Diagnoses   Final diagnoses:  Fecal impaction [facility])    New Prescriptions Discharge Medication List as of 05/17/2016  4:16 PM       Deno Etienne, DO 05/18/16 [phone]

## 2016-05-17 NOTE — ED Triage Notes (Signed)
Patient reports that he was seen at [facility] yesterday for constipation/fecal impaction. Patient called his PCP today and was told to come to the ED. Patient has a history of Crohn's. Patient states when he wipes he has bright red blood on the paper.

## 2016-05-17 NOTE — ED Notes (Signed)
Pt sitting on bedside commode, pt note able to have a BM due to the amount of rectal pain that he is in.

## 2016-05-23 DIAGNOSIS — K5000 Crohn's disease of small intestine without complications: Secondary | ICD-10-CM | POA: Diagnosis not present

## 2016-05-25 DIAGNOSIS — R351 Nocturia: Secondary | ICD-10-CM | POA: Diagnosis not present

## 2016-05-26 ENCOUNTER — Encounter: Payer: Self-pay | Admitting: Gastroenterology

## 2016-05-26 DIAGNOSIS — E2749 Other adrenocortical insufficiency: Secondary | ICD-10-CM | POA: Diagnosis not present

## 2016-05-26 DIAGNOSIS — K5000 Crohn's disease of small intestine without complications: Secondary | ICD-10-CM | POA: Diagnosis not present

## 2016-05-26 DIAGNOSIS — E538 Deficiency of other specified B group vitamins: Secondary | ICD-10-CM | POA: Diagnosis not present

## 2016-06-28 ENCOUNTER — Ambulatory Visit (INDEPENDENT_AMBULATORY_CARE_PROVIDER_SITE_OTHER): Payer: PPO | Admitting: Gastroenterology

## 2016-06-28 ENCOUNTER — Encounter: Payer: Self-pay | Admitting: Gastroenterology

## 2016-06-28 ENCOUNTER — Other Ambulatory Visit (INDEPENDENT_AMBULATORY_CARE_PROVIDER_SITE_OTHER): Payer: PPO

## 2016-06-28 ENCOUNTER — Encounter (INDEPENDENT_AMBULATORY_CARE_PROVIDER_SITE_OTHER): Payer: Self-pay

## 2016-06-28 VITALS — BP 120/68 | HR 68 | Ht 71.0 in | Wt 180.6 lb

## 2016-06-28 DIAGNOSIS — E2740 Unspecified adrenocortical insufficiency: Secondary | ICD-10-CM | POA: Diagnosis not present

## 2016-06-28 DIAGNOSIS — K50119 Crohn's disease of large intestine with unspecified complications: Secondary | ICD-10-CM

## 2016-06-28 DIAGNOSIS — K50012 Crohn's disease of small intestine with intestinal obstruction: Secondary | ICD-10-CM | POA: Diagnosis not present

## 2016-06-28 LAB — FERRITIN: Ferritin: 110.6 ng/mL (ref 22.0–322.0)

## 2016-06-28 LAB — IBC PANEL
IRON: 103 ug/dL (ref 42–165)
Saturation Ratios: 32.8 % (ref 20.0–50.0)
Transferrin: 224 mg/dL (ref 212.0–360.0)

## 2016-06-28 LAB — HIGH SENSITIVITY CRP: CRP HIGH SENSITIVITY: 6.32 mg/L — AB (ref 0.000–5.000)

## 2016-06-28 LAB — SEDIMENTATION RATE: SED RATE: 14 mm/h (ref 0–20)

## 2016-06-28 LAB — VITAMIN D 25 HYDROXY (VIT D DEFICIENCY, FRACTURES): VITD: 22.99 ng/mL — AB (ref 30.00–100.00)

## 2016-06-28 LAB — VITAMIN B12: Vitamin B-12: 331 pg/mL (ref 211–911)

## 2016-06-28 NOTE — Patient Instructions (Addendum)
If you are age 81 or older, your body mass index should be between 23-30. Your Body mass index is 25.19 kg/m. If this is out of the aforementioned range listed, please consider follow up with your Primary Care Provider.  If you are age 27 or younger, your body mass index should be between 19-25. Your Body mass index is 25.19 kg/m. If this is out of the aformentioned range listed, please consider follow up with your Primary Care Provider.   Your physician has requested that you go to the basement for lab work before leaving today.  You have been scheduled for a bone density test on Thursday, June 21st at 9:00am. Please arrive 15 minutes prior to your scheduled appointment to radiology on the basement floor of [place] location for this test. If you need to cancel or reschedule for any reason, please contact radiology at [phone].  Preparation for test is as follows:   If you are taking calcium, discontinue this 24-48 hours prior to your appointment.   Wear pants with an elastic waistband (or without any metal such as a zipper).   Do not wear an underwire bra.   We do have gowns if you are unable to find appropriate clothing without metal.   Please bring a list of all current medications.  Please follow up with Dr. Havery Moros in 3 months. You will be contacted by letter when it is time to schedule this.  Thank you.

## 2016-06-28 NOTE — Progress Notes (Signed)
HPI :  83 y/o male with a history of Crohn's disease, anemia, glaucoma, here for a new patient evaluation for Crohn's disease at the request of Dr. Earle Gell. Previously seen by Dr. Wynetta Emery and patient is transitioning care as he retires.   Old notes reviewed from Dr. 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 He has had 2 operations for his Crohns in 1968 and then again 2001 or so for small bowel resections. He has been treated with prednisone as needed in the past. He has never been on any other therapy for Crohn's disease other than prednisone and surgery. He has had hospitalizations in the past for bowel obstructions. Last hospitalization has been at least a few years ago. He has not needed much of any therapy for his Crohn's disease in the past few years and has generally been feeling pretty well.   Unfortunately he has suffered complications from chronic steroid use. He is currently being followed by Endocrinology for adrenal insufficiency. He has also been noted to have hepatic steatosis and avascular necrosis of the hips on CT scan. His course has also been complicated by history of diverticulosis related bleeding.   He is taking hydrocortizone 72m daily for adrenal insufficiency, due to see Endocrinology in the near future.   His typical flare symptoms of Crohns are mostly abdominal pain and abdominal distension. No diarrhea at baseline. Normally has loose to soft stools, has roughly 3 BMs per day or so at baseline. No blood in the stools.   He was most recently seen in the ER for fecal impaction due to medication taken for his prostate, has been on Miralax and generally feeling pretty well since that time. Weight is stable, eating well without restriction. He has no complaints today.  Most recent workup: CT scan 05/27/2016 - avascular necrosis of the R femoral head, thickening of the  ileum Colonoscopy 12/2015 - normal colon, patent ileocolonic anastomosis, active Crohn's ileitis  Immunizations UTD: Pneumovvax 2005 Prevnar 2016 Shingles vaccine 2007 TDAP 2012  Past Medical History:  Diagnosis Date  . Anemia   . Crohn's disease ([place]   . Glaucoma   . Inguinal hernia recurrent bilateral   . Retinal ischemia      Past Surgical History:  Procedure Laterality Date  . ABDOMINAL SURGERY    . COLONOSCOPY N/A 12/13/2015   Procedure: COLONOSCOPY;  Surgeon: JLaurence Spates MD;  Location: [facility];  Service: Endoscopy;  Laterality: N/A;  . INGUINAL HERNIA REPAIR Bilateral 05/21/2014   Procedure: OPEN BILATERAL INGUINAL HERNIA REPAIRS WITH MESH;  Surgeon: MDonnie Mesa MD;  Location: [facility]  Service: General;  Laterality: Bilateral;  . SMALL INTESTINE SURGERY  1968, 2001    related to Chrohn's disease  . TONSILLECTOMY     Family History  Problem Relation Age of Onset  . Deep vein thrombosis Mother   . Heart disease Father    Social History  Substance Use Topics  . Smoking status: Former Smoker    Quit date: 04/01/1976  . Smokeless tobacco: Never Used  . Alcohol use Yes     Comment: 2-3  glass wine  weekly  1-2 cans beer/ week   Current Outpatient Prescriptions  Medication Sig Dispense Refill  . brimonidine (ALPHAGAN) 0.2 % ophthalmic solution Place 1 drop into both eyes 2 (two) times daily.    . Cyanocobalamin (VITAMIN B-12 IJ) Inject 1 mL as directed  every 30 (thirty) days.     . dorzolamide-timolol (COSOPT) 22.3-6.8 MG/ML ophthalmic solution Place 1 drop into both eyes 2 (two) times daily.    . hydrocortisone (CORTEF) 10 MG tablet Take 10 mg by mouth daily.    . IRON PO Take 1 tablet by mouth 3 (three) times a week.     . latanoprost (XALATAN) 0.005 % ophthalmic solution Place 1 drop into the left eye at bedtime.     . prednisoLONE acetate (PRED FORTE) 1 % ophthalmic suspension Place 1 drop into the right eye 2 (two) times daily.    Marland Kitchen  oxybutynin (DITROPAN) 5 MG tablet Take 5 mg by mouth 3 (three) times daily.     No current facility-administered medications for this visit.    No Known Allergies   Review of Systems: All systems reviewed and negative except where noted in HPI.   Lab Results  Component Value Date   WBC 10.9 (H) 05/17/2016   HGB 12.7 (L) 05/17/2016   HCT 36.1 (L) 05/17/2016   MCV 90.3 05/17/2016   PLT 202 05/17/2016    Lab Results  Component Value Date   CREATININE 0.97 05/17/2016   BUN 12 05/17/2016   NA 136 05/17/2016   K 3.3 (L) 05/17/2016   CL 103 05/17/2016   CO2 24 05/17/2016     Physical Exam: BP 120/68   Pulse 68   Ht _0  (1.803 m)   Wt 180 lb 9.6 oz (81.9 kg)   BMI 25.19 kg/m  Constitutional: Pleasant,well-developed, male in no acute distress. HEENT: Normocephalic and atraumatic. Conjunctivae are normal. No scleral icterus. Neck supple.  Cardiovascular: Normal rate, regular rhythm.  Pulmonary/chest: Effort normal and breath sounds normal. No wheezing, rales or rhonchi. Abdominal: Soft, nondistended, nontender, abdominal scars noed. There are no masses palpable. No hepatomegaly. Extremities: no edema Lymphadenopathy: No cervical adenopathy noted. Neurological: Alert and oriented to person place and time. Skin: Skin is warm and dry. No rashes noted. Psychiatric: Normal mood and affect. Behavior is normal.   ASSESSMENT AND PLAN: 83 year old male here for new patient assessment for Crohn's disease.  He has stricturing ileal Crohn's disease with 2 operations remotely in the past, and other hospitalizations for bowel obstructions. Managed with prednisone as needed historically, no prior treatment with anti-TNF or immunomodulators. Unfortunately he has developed adrenal insufficiency and other complications from long-term steroid use, currently being followed by endocrinology.  We discussed with Crohn's disease is, discussed natural history, risk for recurrent obstructions,  and treatment options. He does have evidence of active ileal Crohn's disease on most recent colonoscopy and CT scan and is at risk for future obstructions / complications from Crohn's. We discussed treatment options to treat small bowel Crohn's if he wanted therapy for his Crohn's includes anti-TNF, thiopurines, methotrexate, Entyvio, etc, discussed risks / benefits. Weyman Rodney would be the safest option from risk perspective although likely the most expensive. Given his age, his concern about potential risks / costs of medication options, and that he has not had any recent symptoms from his Crohn's, he wished to hold off on therapy at this point which is reasonable. If he has symptoms in the future he will contact me - would like to avoid prednisone if at all possible moving forward. Could consider budesonide if needed although this does have some systemic absorption.   Otherwise, will send basic labs today to include B12, iron studies, CRP, ESR, and vitamin D level. Given his prednisone use in the past will screen for  osteoporosis with DEXA. Will let him know results. I will see him again in 3 months for reassessment. He will follow up with his endocrinologist for management of adrenal insufficiency.  Lenapah Cellar, MD [facility] Pager [phone]

## 2016-06-29 DIAGNOSIS — D649 Anemia, unspecified: Secondary | ICD-10-CM | POA: Diagnosis not present

## 2016-06-29 DIAGNOSIS — E2749 Other adrenocortical insufficiency: Secondary | ICD-10-CM | POA: Diagnosis not present

## 2016-06-29 DIAGNOSIS — Z92241 Personal history of systemic steroid therapy: Secondary | ICD-10-CM | POA: Diagnosis not present

## 2016-06-29 DIAGNOSIS — Z5181 Encounter for therapeutic drug level monitoring: Secondary | ICD-10-CM | POA: Diagnosis not present

## 2016-06-30 ENCOUNTER — Ambulatory Visit (INDEPENDENT_AMBULATORY_CARE_PROVIDER_SITE_OTHER)
Admission: RE | Admit: 2016-06-30 | Discharge: 2016-06-30 | Disposition: A | Discharge status: Home / Self Care | Payer: PPO | Source: Ambulatory Visit | Attending: Gastroenterology | Admitting: Gastroenterology

## 2016-06-30 DIAGNOSIS — Z7952 Long term (current) use of systemic steroids: Secondary | ICD-10-CM

## 2016-06-30 DIAGNOSIS — K50012 Crohn's disease of small intestine with intestinal obstruction: Secondary | ICD-10-CM

## 2016-06-30 DIAGNOSIS — E2740 Unspecified adrenocortical insufficiency: Secondary | ICD-10-CM

## 2016-07-01 ENCOUNTER — Other Ambulatory Visit: Payer: Self-pay

## 2016-07-01 ENCOUNTER — Telehealth: Payer: Self-pay | Admitting: Gastroenterology

## 2016-07-01 DIAGNOSIS — D649 Anemia, unspecified: Secondary | ICD-10-CM

## 2016-07-01 DIAGNOSIS — R351 Nocturia: Secondary | ICD-10-CM | POA: Diagnosis not present

## 2016-07-01 DIAGNOSIS — E559 Vitamin D deficiency, unspecified: Secondary | ICD-10-CM

## 2016-07-01 MED ORDER — VITAMIN D (ERGOCALCIFEROL) 1.25 MG (50000 UNIT) PO CAPS: 50000.0000 [IU] | ORAL_CAPSULE | ORAL | 0 refills | Status: DC

## 2016-07-01 NOTE — Telephone Encounter (Signed)
Patient saw his urologist today and has started him on HCTZ for ankle swelling and overactive bladder.  Wife called they were concerned about starting this medication, the fact that doctor tried to do a prostate exam but was unable even with his smallest finger. The urologist felt that patient had scar tissue there that permitted the exam. Patient has been having increased urination throughout the night for last 6-8 months. They were concerned that no scan has been ordered to check the prostate, wanted your input. I told her that his primary care could advise on this, but they do not have a PCP and Dr. Wynetta Emery seemed to act as PCP. Please advise.

## 2016-07-02 NOTE — Telephone Encounter (Signed)
Thanks for the update. The patient did not report any issues with his bowels to suggest an outlet dysfunction when I saw him in clinic. It's possible he has an anal canal stenosis due to Crohn's - last colonoscopy in December did not report any abnormality in the rectum and photographs of this area was normal. If he has symptoms of constipation or difficulty evacuating himself I can see him back in clinic, I did not perform a rectal exam during his intake visit.   Otherwise he had a CT scan of his abdomen / pelvis in May, the prostate was normal on this exam and is reassuring if you can make them aware of that. If they are concerned about imaging of his prostate or lab work related to the prostate, they would need to address this with his Urologist. Thanks

## 2016-07-04 NOTE — Telephone Encounter (Signed)
Left detailed message for patient with Dr. Doyne Keel recommendations, if needed to call and schedule a follow up exam here. Otherwise, check with urologist about any imaging.

## 2016-07-05 ENCOUNTER — Telehealth: Payer: Self-pay | Admitting: Gastroenterology

## 2016-07-05 NOTE — Telephone Encounter (Signed)
Discussed with her everything I left a message about re: Dr. Havery Moros reviewing past results. They will continue to follow up with the urologist. I told patient's wife if they had concerns prior to follow up visit to be scheduled in September to call office.

## 2016-07-07 ENCOUNTER — Encounter (INDEPENDENT_AMBULATORY_CARE_PROVIDER_SITE_OTHER): Payer: PPO | Admitting: Ophthalmology

## 2016-07-25 ENCOUNTER — Telehealth: Payer: Self-pay | Admitting: Gastroenterology

## 2016-07-25 NOTE — Telephone Encounter (Signed)
Dr Havery Moros- After DEXA you recommended patient take calcium 1200 mg daily in addition to his vitamin D. Should he be taking calcium indefinitely or were you intending on him taking this just until his vitamin D course was complete?

## 2016-07-25 NOTE — Telephone Encounter (Signed)
Indefinitely. Thanks

## 2016-07-26 NOTE — Telephone Encounter (Signed)
Left message for patient to call back  

## 2016-07-26 NOTE — Telephone Encounter (Signed)
Pts wife informed that pt should continue with Calcium indefinitely. Made 3 month follow for 09/27/16 at 3

## 2016-07-28 DIAGNOSIS — E2749 Other adrenocortical insufficiency: Secondary | ICD-10-CM | POA: Diagnosis not present

## 2016-07-29 DIAGNOSIS — L089 Local infection of the skin and subcutaneous tissue, unspecified: Secondary | ICD-10-CM | POA: Diagnosis not present

## 2016-07-29 DIAGNOSIS — Z85828 Personal history of other malignant neoplasm of skin: Secondary | ICD-10-CM | POA: Diagnosis not present

## 2016-07-29 DIAGNOSIS — L821 Other seborrheic keratosis: Secondary | ICD-10-CM | POA: Diagnosis not present

## 2016-07-29 DIAGNOSIS — D485 Neoplasm of uncertain behavior of skin: Secondary | ICD-10-CM | POA: Diagnosis not present

## 2016-08-19 ENCOUNTER — Telehealth: Payer: Self-pay | Admitting: Gastroenterology

## 2016-08-19 NOTE — Telephone Encounter (Signed)
Left message to call back  

## 2016-08-22 MED ORDER — VITAMIN D 50 MCG (2000 UT) PO CAPS: 1.0000 | ORAL_CAPSULE | Freq: Every day | ORAL | Status: DC

## 2016-08-22 NOTE — Addendum Note (Signed)
Addended by: Doristine Counter on: 08/22/2016 11:21 AM   Modules accepted: Orders

## 2016-08-22 NOTE — Telephone Encounter (Signed)
Spoke to [place], she just wanted to confirm that after patient finished his Vit D 50,000 IU for 6 weeks, he was to start OTC Vit D 2000 IU daily. Patient's wife reassured that this is what he was instructed to do.

## 2016-09-05 ENCOUNTER — Other Ambulatory Visit: Payer: Self-pay

## 2016-09-08 DIAGNOSIS — E2749 Other adrenocortical insufficiency: Secondary | ICD-10-CM | POA: Diagnosis not present

## 2016-09-08 DIAGNOSIS — Z92241 Personal history of systemic steroid therapy: Secondary | ICD-10-CM | POA: Diagnosis not present

## 2016-09-13 ENCOUNTER — Other Ambulatory Visit (INDEPENDENT_AMBULATORY_CARE_PROVIDER_SITE_OTHER): Payer: PPO

## 2016-09-13 DIAGNOSIS — E559 Vitamin D deficiency, unspecified: Secondary | ICD-10-CM | POA: Diagnosis not present

## 2016-09-13 DIAGNOSIS — D649 Anemia, unspecified: Secondary | ICD-10-CM | POA: Diagnosis not present

## 2016-09-13 LAB — CBC WITH DIFFERENTIAL/PLATELET
Basophils Absolute: 0.1 10*3/uL (ref 0.0–0.1)
Basophils Relative: 0.8 % (ref 0.0–3.0)
EOS PCT: 3.6 % (ref 0.0–5.0)
Eosinophils Absolute: 0.3 10*3/uL (ref 0.0–0.7)
HCT: 35 % — ABNORMAL LOW (ref 39.0–52.0)
HEMOGLOBIN: 12.1 g/dL — AB (ref 13.0–17.0)
LYMPHS PCT: 27.3 % (ref 12.0–46.0)
Lymphs Abs: 2.5 10*3/uL (ref 0.7–4.0)
MCHC: 34.5 g/dL (ref 30.0–36.0)
MCV: 92.5 fl (ref 78.0–100.0)
MONO ABS: 0.8 10*3/uL (ref 0.1–1.0)
MONOS PCT: 8.6 % (ref 3.0–12.0)
Neutro Abs: 5.4 10*3/uL (ref 1.4–7.7)
Neutrophils Relative %: 59.7 % (ref 43.0–77.0)
Platelets: 237 10*3/uL (ref 150.0–400.0)
RBC: 3.78 Mil/uL — AB (ref 4.22–5.81)
RDW: 13.3 % (ref 11.5–15.5)
WBC: 9 10*3/uL (ref 4.0–10.5)

## 2016-09-13 LAB — VITAMIN D 25 HYDROXY (VIT D DEFICIENCY, FRACTURES): VITD: 35.41 ng/mL (ref 30.00–100.00)

## 2016-09-14 ENCOUNTER — Other Ambulatory Visit: Payer: Self-pay

## 2016-09-14 DIAGNOSIS — D649 Anemia, unspecified: Secondary | ICD-10-CM

## 2016-09-16 ENCOUNTER — Other Ambulatory Visit (INDEPENDENT_AMBULATORY_CARE_PROVIDER_SITE_OTHER): Payer: PPO

## 2016-09-16 DIAGNOSIS — D649 Anemia, unspecified: Secondary | ICD-10-CM

## 2016-09-16 LAB — FOLATE: Folate: 19.5 ng/mL (ref >5.9)

## 2016-09-16 LAB — TESTOSTERONE: Testosterone: 153.2 ng/dL — ABNORMAL LOW (ref 300.00–890.00)

## 2016-09-16 LAB — TSH: TSH: 2.03 u[IU]/mL (ref 0.35–4.50)

## 2016-09-27 ENCOUNTER — Ambulatory Visit (INDEPENDENT_AMBULATORY_CARE_PROVIDER_SITE_OTHER): Payer: PPO | Admitting: Gastroenterology

## 2016-09-27 ENCOUNTER — Encounter: Payer: Self-pay | Admitting: Gastroenterology

## 2016-09-27 ENCOUNTER — Encounter (INDEPENDENT_AMBULATORY_CARE_PROVIDER_SITE_OTHER): Payer: Self-pay

## 2016-09-27 ENCOUNTER — Ambulatory Visit: Payer: PPO | Admitting: Gastroenterology

## 2016-09-27 VITALS — BP 124/70 | HR 64 | Ht 71.0 in | Wt 181.4 lb

## 2016-09-27 DIAGNOSIS — E559 Vitamin D deficiency, unspecified: Secondary | ICD-10-CM | POA: Diagnosis not present

## 2016-09-27 DIAGNOSIS — K50019 Crohn's disease of small intestine with unspecified complications: Secondary | ICD-10-CM

## 2016-09-27 DIAGNOSIS — M8580 Other specified disorders of bone density and structure, unspecified site: Secondary | ICD-10-CM

## 2016-09-27 DIAGNOSIS — D649 Anemia, unspecified: Secondary | ICD-10-CM

## 2016-09-27 DIAGNOSIS — R7989 Other specified abnormal findings of blood chemistry: Secondary | ICD-10-CM | POA: Diagnosis not present

## 2016-09-27 NOTE — Progress Notes (Signed)
HPI :  83 year old male here for follow-up for Crohn's disease and anemia.   Crohn's history: Previously followed by Dr. Earle Gell. 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 He has had 2 operations for his Crohns in 1968 and then again 2001 or so for small bowel resections. He has been treated with prednisone as needed in the past. He has never been on any other therapy for Crohn's disease other than prednisone and surgery. He has had hospitalizations in the past for bowel obstructions. Last hospitalization has been at least a few years ago. He has not needed much of any therapy for his Crohn's disease in the past few years and has generally been feeling pretty well. Unfortunately he has suffered complications from chronic steroid use. He is currently being followed by Endocrinology for adrenal insufficiency. He has also been noted to have hepatic steatosis and avascular necrosis of the hips on CT scan. His course has also been complicated by history of diverticulosis related bleeding.   SINCE LAST VISIT:  Patient was seen by urology for prostate issues, noted to have a narrow anal canal on physical exam. He reports his bowel habits are stable. He is having 2-3 bowel movement was per day, often loose. He is taking Imodium as needed for symptoms which has worked well. He denies any blood in his stools. He denies any abdominal pains. His energy level is good, he is playing tennis routinely. He just came back from a vacation where he walked several miles frequently.  He continues to state he does not want any medical therapy for Crohn's disease.  Since last visit Iron and B12 levels were normal, CRP mildly elevated and ESR normal Vitamin D was low at 23, taking supplemental vitamin D, Repeat was 35. CBC shows anemia with Hgb of 12.1, MCV 92 Testosterone was 153 and recommended he see Endocrine. He has not done this  DEXA 06/2016 - mild osteopenia  Most recent workup: CT scan 05/27/2016 -  avascular necrosis of the R femoral head, thickening of the ileum Colonoscopy 12/2015 - normal colon, patent ileocolonic anastomosis, active Crohn's ileitis  Immunizations UTD: Pneumovvax 2005 Prevnar 2016 Shingles vaccine 2007 TDAP 2012   Past Medical History:  Diagnosis Date  . Adrenal insufficiency ([place])   . Anemia   . Avascular necrosis ([place])   . Crohn's disease ([place])   . Glaucoma   . Inguinal hernia recurrent bilateral   . Low testosterone   . Osteopenia   . Retinal ischemia      Past Surgical History:  Procedure Laterality Date  . ABDOMINAL SURGERY    . COLONOSCOPY N/A 12/13/2015   Procedure: COLONOSCOPY;  Surgeon: Laurence Spates, MD;  Location: [facility];  Service: Endoscopy;  Laterality: N/A;  . INGUINAL HERNIA REPAIR Bilateral 05/21/2014   Procedure: OPEN BILATERAL INGUINAL HERNIA REPAIRS WITH MESH;  Surgeon: Donnie Mesa, MD;  Location: [facility];  Service: General;  Laterality: Bilateral;  . SMALL INTESTINE SURGERY  1968, 2001    related to Chrohn's disease  . TONSILLECTOMY     Family History  Problem Relation Age of Onset  . Deep vein thrombosis Mother   . Heart disease Father    Social History  Substance Use Topics  . Smoking status: Former Smoker    Quit date: 04/01/1976  . Smokeless tobacco: Never Used  . Alcohol use Yes     Comment: 2-3  glass wine  weekly  1-2 cans beer/ week   Current Outpatient Prescriptions  Medication Sig Dispense Refill  . brimonidine (ALPHAGAN) 0.2 % ophthalmic solution Place 1 drop into both eyes 2 (two) times daily.    . Calcium Carbonate-Vit D-Min (CALCIUM 1200 PO) Take by mouth daily.    . Cholecalciferol (VITAMIN D) 2000 units CAPS Take 1 capsule (2,000 Units total) by mouth daily. 30 capsule   . Cyanocobalamin (VITAMIN B-12 IJ) Inject 1 mL as directed every 30 (thirty) days.     . dorzolamide-timolol (COSOPT) 22.3-6.8 MG/ML ophthalmic solution Place 1 drop into both eyes 2 (two) times daily.    .  IRON PO Take 1 tablet by mouth 3 (three) times a week.     . latanoprost (XALATAN) 0.005 % ophthalmic solution Place 1 drop into the left eye at bedtime.     [place] Dimesylate (RHOPRESSA) 0.02 % SOLN Apply to eye at bedtime.    Marland Kitchen omeprazole (PRILOSEC) 20 MG capsule Take 20 mg by mouth daily. Before breakfast    . prednisoLONE acetate (PRED FORTE) 1 % ophthalmic suspension Place 1 drop into the right eye 4 (four) times daily.      No current facility-administered medications for this visit.    No Known Allergies   Review of Systems: All systems reviewed and negative except where noted in HPI.   Lab Results  Component Value Date   WBC 9.0 09/13/2016   HGB 12.1 (L) 09/13/2016   HCT 35.0 (L) 09/13/2016   MCV 92.5 09/13/2016   PLT 237.0 09/13/2016    Lab Results  Component Value Date   CREATININE 0.97 05/17/2016   BUN 12 05/17/2016   NA 136 05/17/2016   K 3.3 (L) 05/17/2016   CL 103 05/17/2016   CO2 24 05/17/2016    Lab Results  Component Value Date   ALT 13 (L) 05/17/2016   AST 18 05/17/2016   ALKPHOS 54 05/17/2016   BILITOT 2.4 (H) 05/17/2016     Physical Exam: BP 124/70   Pulse 64   Ht _0  (1.803 m)   Wt 181 lb 6 oz (82.3 kg)   BMI 25.30 kg/m  Constitutional: Pleasant,well-developed, male in no acute distress. HEENT: Normocephalic and atraumatic. Conjunctivae are normal. No scleral icterus. Neck supple.  Cardiovascular: Normal rate, regular rhythm.  Pulmonary/chest: Effort normal and breath sounds normal. No wheezing, rales or rhonchi. Abdominal: Soft, nondistended, nontender.  There are no masses palpable. No hepatomegaly. DRE - mild rectal stricture but able to pass exam finger, no mass lesion Extremities: no edema Lymphadenopathy: No cervical adenopathy noted. Neurological: Alert and oriented to person place and time. Skin: Skin is warm and dry. No rashes noted. Psychiatric: Normal mood and affect. Behavior is normal.   ASSESSMENT AND  PLAN: 83 year old male here for reassessment the following issues:  Crohn's disease - stricturing ileal Crohn's - history as outlined above. See initial consultation for full discussion of his history. At this point in time he is declining therapy for Crohn's disease, given he generally feels well, has had remote surgeries in the past. If he develops worsening diarrhea or abdominal pain he will contact me for reassessment. Colonoscopy is up-to-date. He has a mild rectal stricture on DRE. Prior colonoscopy images less than one year ago of the rectum were normal, I doubt any malignant change. He's had no bowel symptoms. He'll monitor this for now, can contact me if he develops symptoms. Follow-up in 6 months  Anemia / low testosterone - suspect low T is causing anemia. He will follow up with his endocrinologist  to discuss therapy.   Osteopenia / vitamin D deficiency - continue calcium and vitamin D supplementation, he will follow up with endocrinology for this issue. Will need to avoid steroid moving forward given his multiple complications from long term steroid use.  I otherwise advised him to establish with a new primary care physician given he does not have one at present time. He is awaiting the new shingles vaccine. Recommend yearly flu shot as well  Posey Cellar, MD [facility] Pager [phone]

## 2016-09-27 NOTE — Patient Instructions (Signed)
If you are age 81 or older, your body mass index should be between 23-30. Your Body mass index is 25.3 kg/m. If this is out of the aforementioned range listed, please consider follow up with your Primary Care Provider.  If you are age 70 or younger, your body mass index should be between 19-25. Your Body mass index is 25.3 kg/m. If this is out of the aformentioned range listed, please consider follow up with your Primary Care Provider.    Continue with Imodium as needed.  Please follow up with your endocrinologist regarding your osteopenia, Vitamin D deficiency and low testosterone.   We have given you copies of your testorerone labs and bone density scan for your records.  Please follow up with Dr. Havery Moros in 6  Months.

## 2016-09-28 DIAGNOSIS — H401133 Primary open-angle glaucoma, bilateral, severe stage: Secondary | ICD-10-CM | POA: Diagnosis not present

## 2016-09-30 ENCOUNTER — Telehealth: Payer: Self-pay | Admitting: Gastroenterology

## 2016-09-30 ENCOUNTER — Other Ambulatory Visit: Payer: Self-pay

## 2016-09-30 MED ORDER — PROBIOTIC PO CAPS: 1.0000 | ORAL_CAPSULE | Freq: Every day | ORAL | 11 refills | Status: DC

## 2016-09-30 NOTE — Telephone Encounter (Signed)
Thanks. Shingles vaccine is fine. If he feels probiotics is helping him he can continue it. If he feels no difference recommend he stop it.

## 2016-09-30 NOTE — Telephone Encounter (Signed)
Spoke to patient's wife, advised of recommendations to continue probiotic if he feels it is helping. He also has appointment with his endocrinologist on Monday, 9/24. Asked that they send office note here following that visit.

## 2016-09-30 NOTE — Telephone Encounter (Signed)
Updated medication and immunization list.

## 2016-09-30 NOTE — Telephone Encounter (Signed)
FYI

## 2016-09-30 NOTE — Telephone Encounter (Signed)
Patient wife called back in stating that patient had shingles shot on 09/28/16 at [employer].

## 2016-10-03 DIAGNOSIS — M8580 Other specified disorders of bone density and structure, unspecified site: Secondary | ICD-10-CM | POA: Diagnosis not present

## 2016-10-03 DIAGNOSIS — Z92241 Personal history of systemic steroid therapy: Secondary | ICD-10-CM | POA: Diagnosis not present

## 2016-10-03 DIAGNOSIS — E349 Endocrine disorder, unspecified: Secondary | ICD-10-CM | POA: Diagnosis not present

## 2016-10-03 DIAGNOSIS — Z125 Encounter for screening for malignant neoplasm of prostate: Secondary | ICD-10-CM | POA: Diagnosis not present

## 2016-10-04 LAB — HEPATIC FUNCTION PANEL
ALK PHOS: 49 (ref 25–125)
ALT: 8 — AB (ref 10–40)
AST: 12 — AB (ref 14–40)
BILIRUBIN, TOTAL: 0.9

## 2016-10-04 LAB — BASIC METABOLIC PANEL
BUN: 13 (ref 4–21)
CREATININE: 1.1 (ref 0.6–1.3)
Glucose: 87
Potassium: 3.8 (ref 3.4–5.3)
Sodium: 138 (ref 137–147)

## 2016-10-06 DIAGNOSIS — E349 Endocrine disorder, unspecified: Secondary | ICD-10-CM | POA: Diagnosis not present

## 2016-10-06 DIAGNOSIS — M859 Disorder of bone density and structure, unspecified: Secondary | ICD-10-CM | POA: Diagnosis not present

## 2016-10-06 DIAGNOSIS — Z125 Encounter for screening for malignant neoplasm of prostate: Secondary | ICD-10-CM | POA: Diagnosis not present

## 2016-10-11 ENCOUNTER — Telehealth: Payer: Self-pay | Admitting: Internal Medicine

## 2016-10-11 NOTE — Telephone Encounter (Signed)
Pt called stating that he was a previous pt of Earle Gell who is no longer practicing. He recommended that he see you as his PCP because you are "fabulous"! :) Would you be willing to see him to establish care? For some reason you are already put on his chart as his pcp.Marland KitchenMarland Kitchen

## 2016-10-12 NOTE — Telephone Encounter (Signed)
Let him know that at this time I am not able to accept any new patients.  Please remove my name from banner.

## 2016-10-12 NOTE — Telephone Encounter (Signed)
Spoke with pt to let him know. I did give him the option to see [employer]. He said that he would talk with him wife and call me back.

## 2016-10-27 DIAGNOSIS — L570 Actinic keratosis: Secondary | ICD-10-CM | POA: Diagnosis not present

## 2016-10-27 DIAGNOSIS — C44319 Basal cell carcinoma of skin of other parts of face: Secondary | ICD-10-CM | POA: Diagnosis not present

## 2016-10-27 DIAGNOSIS — D485 Neoplasm of uncertain behavior of skin: Secondary | ICD-10-CM | POA: Diagnosis not present

## 2016-10-27 DIAGNOSIS — L820 Inflamed seborrheic keratosis: Secondary | ICD-10-CM | POA: Diagnosis not present

## 2016-10-27 DIAGNOSIS — L821 Other seborrheic keratosis: Secondary | ICD-10-CM | POA: Diagnosis not present

## 2016-10-27 DIAGNOSIS — Z85828 Personal history of other malignant neoplasm of skin: Secondary | ICD-10-CM | POA: Diagnosis not present

## 2016-10-27 DIAGNOSIS — L309 Dermatitis, unspecified: Secondary | ICD-10-CM | POA: Diagnosis not present

## 2016-10-27 DIAGNOSIS — C44311 Basal cell carcinoma of skin of nose: Secondary | ICD-10-CM | POA: Diagnosis not present

## 2016-11-03 DIAGNOSIS — E349 Endocrine disorder, unspecified: Secondary | ICD-10-CM | POA: Diagnosis not present

## 2016-11-03 DIAGNOSIS — Z92241 Personal history of systemic steroid therapy: Secondary | ICD-10-CM | POA: Diagnosis not present

## 2016-11-03 DIAGNOSIS — M8580 Other specified disorders of bone density and structure, unspecified site: Secondary | ICD-10-CM | POA: Diagnosis not present

## 2016-12-29 ENCOUNTER — Telehealth: Payer: Self-pay | Admitting: Gastroenterology

## 2016-12-29 DIAGNOSIS — H401133 Primary open-angle glaucoma, bilateral, severe stage: Secondary | ICD-10-CM | POA: Diagnosis not present

## 2016-12-29 NOTE — Telephone Encounter (Signed)
Routed to Dr. Armbruster. 

## 2016-12-29 NOTE — Telephone Encounter (Signed)
[name] I'm not sure who is taking new patients in our office, but I would recommend any of them. Would you be able to touch base with primary care downstairs and see which physicians are accepting new patients at [facility]? thanks

## 2016-12-29 NOTE — Telephone Encounter (Signed)
Patient wife calling to ask Dr.Armbruster's recommendations for a pcp who is taking new patients and that know about crohns. Patient wife states they were recommended to Dr.Burns but she is not taking new patients.

## 2016-12-30 NOTE — Telephone Encounter (Signed)
I left a message with Dr. Quay Burow to see if she would accept his case. If she does not have room, will try another provider. Can you let him know? Thanks

## 2016-12-30 NOTE — Telephone Encounter (Signed)
At the [facility] office all MDs will only accept new patients with their approval.  May require a message from you

## 2017-01-05 ENCOUNTER — Telehealth: Payer: Self-pay

## 2017-01-05 ENCOUNTER — Telehealth: Payer: Self-pay | Admitting: Emergency Medicine

## 2017-01-05 NOTE — Telephone Encounter (Signed)
LM for patient to call back to schedule.  Visit Type: Office Visit Visit Note: NEW PATIENT (okay per Dr Quay Burow) Visit Length: 30 minutes

## 2017-01-05 NOTE — Telephone Encounter (Signed)
Will you please contact pt to set up New Pt appt.

## 2017-01-05 NOTE — Telephone Encounter (Signed)
-----   Message from Yetta Flock, MD sent at 01/05/2017  7:44 AM EST ----- Regarding: FW: question [name] can you let this patient know that Dr. Quay Burow has accepted him into her clinic as a new patient. Thanks  ----- Message ----- From: Binnie Rail, MD Sent: 01/04/2017   4:30 PM To: Gwen Her, CMA, # Subject: RE: question                                   I can accept him.   Thanks.    [name] ----- Message ----- From: Yetta Flock, MD Sent: 12/30/2016  12:32 PM To: Binnie Rail, MD Subject: question                                       Hi Dr. Quay Burow,  I hope you are well. I wanted to see if you are accepting any new patients. This patient of mine with Crohn's disease is specifically requesting you as his PCM, I thought I would ask to see if you are accommodating any new patients. If not, I understand you are busy. Thanks for your consideration.  Michael [name]

## 2017-01-05 NOTE — Telephone Encounter (Signed)
-----   Message from Binnie Rail, MD sent at 01/04/2017  4:30 PM EST ----- Regarding: RE: question I can accept him.   Thanks.    [name] ----- Message ----- From: Yetta Flock, MD Sent: 12/30/2016  12:32 PM To: Binnie Rail, MD Subject: question                                       Hi Dr. Quay Burow,  I hope you are well. I wanted to see if you are accepting any new patients. This patient of mine with Crohn's disease is specifically requesting you as his PCM, I thought I would ask to see if you are accommodating any new patients. If not, I understand you are busy. Thanks for your consideration.  Richardson Landry

## 2017-01-05 NOTE — Telephone Encounter (Signed)
Patient's wife advised to contact Dr. Eilleen Kempf office to schedule appointment to establish, Dr. Eilleen Kempf accepted him as patient.

## 2017-01-05 NOTE — Telephone Encounter (Signed)
Spoke to patient's wife, let her know to contact Dr. Eilleen Kempf office to schedule an appointment to get established with that office.

## 2017-02-05 NOTE — Progress Notes (Signed)
Subjective:    Patient ID: Michael [name], male    DOB: [birth year], 84 y.o.   MRN: [id number]  HPI  He is here to establish with a new pcp.   The patient is here for follow up.  Crohn's disease:  He is following with GI.  He is not on any medication.  His Crohn's has been well controlled - he has not had a flare in a while, maybe 2016.  He denies abdominal pain, diarrhea, constipation and blood in the stool.  Glaucoma: He has decreased vision in his right eye.  He is using his drops religiously.  He is up-to-date with his eye exams.  Osteopenia: He has been on steroids several times in the past for his Crohn's disease.  He is currently taking vitamin D.  He did have a bone density last summer and he had osteopenia with high predicted risk of fracture in the next 10 years.  He has seen Dr. Buddy Duty regarding this.  He is exercising regularly.  Overall he feels well.  He has no concerns.  His last physical and routine blood work were a few months ago.  Medications and allergies reviewed with patient and updated if appropriate.  Patient Active Problem List   Diagnosis Date Noted  . Glaucoma 12/12/2015  . Crohn's disease ([place]) 12/12/2015  . Anemia 12/12/2015  . Rectal bleeding 12/11/2015  . 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 brimonidine (ALPHAGAN) 0.2 % ophthalmic solution Place 1 drop into both eyes 2 (two) times daily.    . Calcium Carbonate-Vit D-Min (CALCIUM 1200 PO) Take by mouth daily.    . Cholecalciferol (VITAMIN D) 2000 units CAPS Take 1 capsule (2,000 Units total) by mouth daily. 30 capsule   . Cyanocobalamin (VITAMIN B-12 IJ) Inject 1 mL as directed every 30 (thirty) days.     . dorzolamide-timolol (COSOPT) 22.3-6.8 MG/ML ophthalmic solution Place 1 drop into both eyes 2 (two) times daily.    . IRON PO Take 1 tablet by mouth 3 (three) times a week.     . latanoprost (XALATAN) 0.005 %  ophthalmic solution Place 1 drop into the left eye at bedtime.     Mckinley Jewel Dimesylate (RHOPRESSA) 0.02 % SOLN Apply to eye at bedtime.    Marland Kitchen omeprazole (PRILOSEC) 20 MG capsule Take 20 mg by mouth daily. Before breakfast    . prednisoLONE acetate (PRED FORTE) 1 % ophthalmic suspension Place 1 drop into the right eye 4 (four) times daily.      No current facility-administered medications on file prior to visit.     Past Medical History:  Diagnosis Date  . Adrenal insufficiency ([place])   . Anemia   . Avascular necrosis ([place])   . Crohn's disease ([place])   . Glaucoma   . Inguinal hernia recurrent bilateral   . Low testosterone   . Osteopenia   . Retinal ischemia     Past Surgical History:  Procedure Laterality Date  . ABDOMINAL SURGERY    . COLONOSCOPY N/A 12/13/2015   Procedure: COLONOSCOPY;  Surgeon: Laurence Spates, MD;  Location: [facility];  Service: Endoscopy;  Laterality: N/A;  . INGUINAL HERNIA REPAIR Bilateral 05/21/2014   Procedure: OPEN BILATERAL INGUINAL HERNIA REPAIRS WITH MESH;  Surgeon: Donnie Mesa, MD;  Location: [facility];  Service: General;  Laterality: Bilateral;  . [place], 2001    related  to Chrohn's disease  . TONSILLECTOMY      Social History   Socioeconomic History  . Marital status: Married    Spouse name: None  . Number of children: 3  . Years of education: None  . Highest education level: None  Social Needs  . Financial resource strain: None  . Food insecurity - worry: None  . Food insecurity - inability: None  . Transportation needs - medical: None  . Transportation needs - non-medical: None  Occupational History  . Occupation: Retired  Tobacco Use  . Smoking status: Former Smoker    Last attempt to quit: 04/01/1976    Years since quitting: 40.8  . Smokeless tobacco: Never Used  Substance and Sexual Activity  . Alcohol use: Yes    Comment: 2-3  glass wine  weekly  1-2 cans beer/ week  . Drug use: No    . Sexual activity: No  Other Topics Concern  . None  Social History Narrative  . None    Family History  Problem Relation Age of Onset  . Deep vein thrombosis Mother   . Heart disease Father     Review of Systems  Constitutional: Negative for appetite change, chills, fatigue and fever.  Eyes: Positive for visual disturbance (decreased vision in right eye - chronic).  Respiratory: Negative for cough, shortness of breath and wheezing.   Cardiovascular: Positive for leg swelling (occ). Negative for chest pain and palpitations.  Gastrointestinal: Negative for abdominal pain, blood in stool, constipation, diarrhea and nausea.  Genitourinary: Positive for frequency (at night).  Musculoskeletal: Positive for arthralgias (intermitttent right shoulder).  Skin: Negative for rash.  Neurological: Negative for light-headedness and headaches.  Psychiatric/Behavioral: Negative for dysphoric mood. The patient is not nervous/anxious.        Objective:   Vitals:   02/06/17 1422  BP: 128/84  Pulse: 68  Resp: 16  Temp: 97.8 F (36.6 C)  SpO2: 98%   Wt Readings from Last 3 Encounters:  02/06/17 187 lb (84.8 kg)  09/27/16 181 lb 6 oz (82.3 kg)  06/28/16 180 lb 9.6 oz (81.9 kg)   Body mass index is 26.08 kg/m.   Physical Exam    Constitutional: Appears well-developed and well-nourished. No distress.  HENT:  Head: Normocephalic and atraumatic.  Neck: Neck supple. No tracheal deviation present. No thyromegaly present.  No cervical lymphadenopathy Cardiovascular: Normal rate, regular rhythm and normal heart sounds.   No murmur heard. No carotid bruit .  No edema Pulmonary/Chest: Effort normal and breath sounds normal. No respiratory distress. No has no wheezes. No rales.  Abdomen: Soft, scar central abdomen from prior surgery, nontender, nondistended Skin: Skin is warm and dry. Not diaphoretic.  Psychiatric: Normal mood and affect. Behavior is normal.      Assessment & Plan:     See Problem List for Assessment and Plan of chronic medical problems.

## 2017-02-06 ENCOUNTER — Ambulatory Visit (INDEPENDENT_AMBULATORY_CARE_PROVIDER_SITE_OTHER): Payer: PPO | Admitting: Internal Medicine

## 2017-02-06 ENCOUNTER — Encounter: Payer: Self-pay | Admitting: Internal Medicine

## 2017-02-06 DIAGNOSIS — M8580 Other specified disorders of bone density and structure, unspecified site: Secondary | ICD-10-CM | POA: Insufficient documentation

## 2017-02-06 DIAGNOSIS — E538 Deficiency of other specified B group vitamins: Secondary | ICD-10-CM | POA: Insufficient documentation

## 2017-02-06 DIAGNOSIS — H409 Unspecified glaucoma: Secondary | ICD-10-CM

## 2017-02-06 DIAGNOSIS — K50012 Crohn's disease of small intestine with intestinal obstruction: Secondary | ICD-10-CM

## 2017-02-06 NOTE — Assessment & Plan Note (Addendum)
Has seen Dr Buddy Duty Taking vitamin D DEXA 06/2016

## 2017-02-06 NOTE — Assessment & Plan Note (Signed)
Receiving B12 supplementation

## 2017-02-06 NOTE — Assessment & Plan Note (Signed)
Controlled without medication Following with GI, up-to-date with visits Currently asymptomatic

## 2017-02-06 NOTE — Patient Instructions (Addendum)
It was nice to meet you.    Medications reviewed and updated.  No changes recommended at this time.    Please followup in the fall for a physical exam.

## 2017-02-09 ENCOUNTER — Encounter: Payer: Self-pay | Admitting: Internal Medicine

## 2017-03-02 ENCOUNTER — Telehealth: Payer: Self-pay | Admitting: Gastroenterology

## 2017-03-02 ENCOUNTER — Other Ambulatory Visit (INDEPENDENT_AMBULATORY_CARE_PROVIDER_SITE_OTHER): Payer: PPO

## 2017-03-02 ENCOUNTER — Other Ambulatory Visit: Payer: Self-pay

## 2017-03-02 DIAGNOSIS — K50012 Crohn's disease of small intestine with intestinal obstruction: Secondary | ICD-10-CM | POA: Diagnosis not present

## 2017-03-02 LAB — CBC WITH DIFFERENTIAL/PLATELET
BASOS ABS: 0 10*3/uL (ref 0.0–0.1)
Basophils Relative: 0.5 % (ref 0.0–3.0)
EOS ABS: 0.4 10*3/uL (ref 0.0–0.7)
Eosinophils Relative: 4.9 % (ref 0.0–5.0)
HCT: 36.5 % — ABNORMAL LOW (ref 39.0–52.0)
Hemoglobin: 12.7 g/dL — ABNORMAL LOW (ref 13.0–17.0)
LYMPHS ABS: 1.9 10*3/uL (ref 0.7–4.0)
Lymphocytes Relative: 27.3 % (ref 12.0–46.0)
MCHC: 34.8 g/dL (ref 30.0–36.0)
MCV: 92.4 fl (ref 78.0–100.0)
MONOS PCT: 9.7 % (ref 3.0–12.0)
Monocytes Absolute: 0.7 10*3/uL (ref 0.1–1.0)
NEUTROS ABS: 4.1 10*3/uL (ref 1.4–7.7)
NEUTROS PCT: 57.6 % (ref 43.0–77.0)
PLATELETS: 198 10*3/uL (ref 150.0–400.0)
RBC: 3.95 Mil/uL — AB (ref 4.22–5.81)
RDW: 13.3 % (ref 11.5–15.5)
WBC: 7.1 10*3/uL (ref 4.0–10.5)

## 2017-03-02 LAB — COMPREHENSIVE METABOLIC PANEL
ALK PHOS: 62 U/L (ref 39–117)
ALT: 8 U/L (ref 0–53)
AST: 10 U/L (ref 0–37)
Albumin: 3.8 g/dL (ref 3.5–5.2)
BILIRUBIN TOTAL: 0.9 mg/dL (ref 0.2–1.2)
BUN: 13 mg/dL (ref 6–23)
CO2: 27 meq/L (ref 19–32)
CREATININE: 1.15 mg/dL (ref 0.40–1.50)
Calcium: 9.1 mg/dL (ref 8.4–10.5)
Chloride: 102 mEq/L (ref 96–112)
GFR: 64.53 mL/min (ref >60.00)
GLUCOSE: 107 mg/dL — AB (ref 70–99)
Potassium: 3.4 mEq/L — ABNORMAL LOW (ref 3.5–5.1)
Sodium: 136 mEq/L (ref 135–145)
TOTAL PROTEIN: 7 g/dL (ref 6.0–8.3)

## 2017-03-02 NOTE — Telephone Encounter (Signed)
It's possible he is having pain from Crohn's disease.  If he is having worse loose stools than usual, would send stool for C diff. Otherwise if it feels like his typical Crohn's pain, agree with liquid diet for a few days. Hopefully he is not having nausea or vomiting. He has had complications from steroid use in the past, we have tried to avoid that. If he feels he really needs something I can offer him some budesonide 24m / day for 2 weeks and then a slow taper. Otherwise would recommend CBC and CMET if he can come to the lab.

## 2017-03-02 NOTE — Telephone Encounter (Signed)
Patient wife wanting [name] to know pt is not having blood in stool but he is not having his normal stools. Pt still wants to be seen as soon as he can.

## 2017-03-02 NOTE — Telephone Encounter (Signed)
Patient wife states pt has been having problems with his crohn's since Monday. Pt tried diet suggested by Dr.Armbruster and that did not help. Pt wife wanting to know what pt can do.

## 2017-03-02 NOTE — Telephone Encounter (Signed)
Spoke to wife, let her know recommendations, labs ordered. He will go do labs. Patient has been doing a liquid diet, did have some soft foods yesterday, but states his volume of stool is decreased. Tried to explain that stool volume can also correlate to amount he is taking in. They did not want to do the budesonide, really wanted to be seen in office, could offer him next Tuesday at 3:45

## 2017-03-02 NOTE — Telephone Encounter (Signed)
Left message for patient that we have scheduled him for a follow up with Dr. Havery Moros next Tuesday, 2/26 at 3:45.

## 2017-03-02 NOTE — Telephone Encounter (Signed)
Okay that's fine. Why don't we have him come next Tuesday

## 2017-03-02 NOTE — Telephone Encounter (Signed)
Patient started having increased left sided abdominal pain, where previous Crohn's surgery done. States it was very hard, painful area. He went on a liquid diet for Tuesday, Wednesday, no fever. He is having loose bowel movements (his normal). The area has softened up but still very tender. Would like to know what else he could do, knows that he should avoid prednisone due to complications he suffered.

## 2017-03-03 ENCOUNTER — Other Ambulatory Visit: Payer: PPO

## 2017-03-03 DIAGNOSIS — K50012 Crohn's disease of small intestine with intestinal obstruction: Secondary | ICD-10-CM

## 2017-03-03 NOTE — Addendum Note (Signed)
Addended by: Isaiah Serge D on: 03/03/2017 08:57 AM   Modules accepted: Orders

## 2017-03-05 LAB — CLOSTRIDIUM DIFFICILE EIA: C DIFFICILE TOXINS A+ B, EIA: NEGATIVE

## 2017-03-07 ENCOUNTER — Ambulatory Visit (INDEPENDENT_AMBULATORY_CARE_PROVIDER_SITE_OTHER): Payer: PPO | Admitting: Gastroenterology

## 2017-03-07 ENCOUNTER — Encounter: Payer: Self-pay | Admitting: Gastroenterology

## 2017-03-07 VITALS — BP 128/72 | HR 76 | Ht 71.0 in | Wt 185.2 lb

## 2017-03-07 DIAGNOSIS — K50019 Crohn's disease of small intestine with unspecified complications: Secondary | ICD-10-CM

## 2017-03-07 MED ORDER — DICYCLOMINE HCL 10 MG PO CAPS: 10.0000 mg | ORAL_CAPSULE | Freq: Three times a day (TID) | ORAL | 1 refills | Status: DC | PRN

## 2017-03-07 NOTE — Patient Instructions (Addendum)
If you are age 82 or older, your body mass index should be between 23-30. Your Body mass index is 25.83 kg/m. If this is out of the aforementioned range listed, please consider follow up with your Primary Care Provider.  If you are age 62 or younger, your body mass index should be between 19-25. Your Body mass index is 25.83 kg/m. If this is out of the aformentioned range listed, please consider follow up with your Primary Care Provider.   We are giving you a hand out today regarding a Low Residue Diet to follow.  We have sent the following medications to your pharmacy for you to pick up at your convenience: Bentyl 10 mg: Take every 8 hours as needed  Thank you for entrusting me with your care and for choosing [employer], Dr. [place] Cellar

## 2017-03-07 NOTE — Progress Notes (Signed)
HPI :  84 year old male here for follow-up for Crohn's disease.   Crohn's history: Previously followed by Dr. Earle Gell. 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 He has had 2 operations for his Crohns in 1968 and then again 2001 or so for small bowel resections. He has been treated with prednisone as needed in the past. He has never been on any other therapy for Crohn's disease other than prednisone and surgery. He has had hospitalizations in the past for bowel obstructions. Last hospitalization has been at least a few years ago. He has not needed much of any therapy for his Crohn's disease in the past few years and has generally been feeling pretty well. Unfortunately he has suffered complications from chronic steroid use. He is currently being followed by Endocrinology foradrenal insufficiency. He has also been noted to have hepatic steatosis and avascular necrosis of the hips on CT scan. His course has also been complicated by history of diverticulosis related bleeding.   SINCE LAST VISIT:  Patient states last week he felt like he had a Crohn's flare. He does endorse some tightness in his abdomen associated with distention, pain located to the right side of his umbilicus. He denies any associated nausea or vomiting with this. He states his stools seem to be at baseline, wife endorses may be slightly decreased output. He never had any overt obstructive symptoms. He reduced his diet to liquid mostly for a day or 2 which she thinks helped. Since that time he states he is feeling better. He has some very mild soreness in the area however his pain is mostly resolved. His bowels are normal. He is having 2-3 formed bowels per day. He is eating well without any nausea or vomiting. This is the first flare of his symptoms is had been at least a year from what he can recall.  He otherwise states he had followed up with his endocrinologist regarding low testosterone levels. He states he had  additional lab work done and told he did not need testosterone replacement therapy.  He had repeat lab work done from last week which showed stable blood work and negative for C. difficile.  Colonoscopy 12/2015 - normal colon, patent ileocolonic anastomosis, active Crohn's ileitis    Past Medical History:  Diagnosis Date  . Adrenal insufficiency ([place])   . Anemia   . Avascular necrosis ([place])   . Crohn's disease ([place])   . Glaucoma   . Inguinal hernia recurrent bilateral   . Low testosterone   . Osteopenia   . Retinal ischemia      Past Surgical History:  Procedure Laterality Date  . ABDOMINAL SURGERY    . COLONOSCOPY N/A 12/13/2015   Procedure: COLONOSCOPY;  Surgeon: Laurence Spates, MD;  [facility];  Service: Endoscopy;  Laterality: N/A;  . INGUINAL HERNIA REPAIR Bilateral 05/21/2014   Procedure: OPEN BILATERAL INGUINAL HERNIA REPAIRS WITH MESH;  Surgeon: Donnie Mesa, MD;  Location: [facility];  Service: General;  Laterality: Bilateral;  . SMALL INTESTINE SURGERY  1968, 2001    related to Chrohn's disease  . TONSILLECTOMY     Family History  Problem Relation Age of Onset  . Deep vein thrombosis Mother   . Heart disease Father    Social History   Tobacco Use  . Smoking status: Former Smoker    Last attempt to quit: 04/01/1976    Years since quitting: 40.9  . Smokeless tobacco: Never Used  Substance Use Topics  . Alcohol use:  Yes    Comment: 2-3  glass wine  weekly  1-2 cans beer/ week  . Drug use: No   Current Outpatient Medications  Medication Sig Dispense Refill  . augmented betamethasone dipropionate (DIPROLENE-AF) 0.05 % cream     . brimonidine (ALPHAGAN) 0.2 % ophthalmic solution Place 1 drop into both eyes 2 (two) times daily.    . Calcium Carbonate-Vit D-Min (CALCIUM 1200 PO) Take by mouth daily.    . Cholecalciferol (VITAMIN D) 2000 units CAPS Take 1 capsule (2,000 Units total) by mouth daily. 30 capsule   . Cyanocobalamin  (VITAMIN B-12 IJ) Inject 1 mL as directed every 30 (thirty) days.     . dorzolamide-timolol (COSOPT) 22.3-6.8 MG/ML ophthalmic solution Place 1 drop into both eyes 2 (two) times daily.    . IRON PO Take 325 mg by mouth 3 (three) times a week.     . latanoprost (XALATAN) 0.005 % ophthalmic solution Place 1 drop into the left eye at bedtime.     Mckinley Jewel Dimesylate (RHOPRESSA) 0.02 % SOLN Apply to eye at bedtime.    Marland Kitchen omeprazole (PRILOSEC) 20 MG capsule Take 20 mg by mouth daily. Before breakfast    . prednisoLONE acetate (PRED FORTE) 1 % ophthalmic suspension Place 1 drop into the right eye 4 (four) times daily.      No current facility-administered medications for this visit.    No Known Allergies   Review of Systems: All systems reviewed and negative except where noted in HPI.   Lab Results  Component Value Date   WBC 7.1 03/02/2017   HGB 12.7 (L) 03/02/2017   HCT 36.5 (L) 03/02/2017   MCV 92.4 03/02/2017   PLT 198.0 03/02/2017    Lab Results  Component Value Date   CREATININE 1.15 03/02/2017   BUN 13 03/02/2017   NA 136 03/02/2017   K 3.4 (L) 03/02/2017   CL 102 03/02/2017   CO2 27 03/02/2017    Lab Results  Component Value Date   ALT 8 03/02/2017   AST 10 03/02/2017   ALKPHOS 62 03/02/2017   BILITOT 0.9 03/02/2017     Physical Exam: BP 128/72   Pulse 76   Ht 5' 11"  (1.803 m)   Wt 185 lb 3.2 oz (84 kg)   BMI 25.83 kg/m  Constitutional: Pleasant,well-developed, male in no acute distress. HEENT: Normocephalic and atraumatic. Conjunctivae are normal. No scleral icterus. Neck supple.  Cardiovascular: Normal rate, regular rhythm.  Pulmonary/chest: Effort normal and breath sounds normal. No wheezing, rales or rhonchi. Abdominal: Soft, nondistended, nontender. Midline abdominal scar. There are no masses palpable. No hepatomegaly. Extremities: no edema Lymphadenopathy: No cervical adenopathy noted. Neurological: Alert and oriented to person place and  time. Skin: Skin is warm and dry. No rashes noted. Psychiatric: Normal mood and affect. Behavior is normal.   ASSESSMENT AND PLAN: 84 year old male here for reassessment of the following issues:  Crohn's disease - stricturing ileal Crohn's. Managed operatively historically with 2 operations, last in 2001. He's had 30 years in between his operations. History of steroid use in the past has led to multiple side effects / complications from this. He has never been on maintenance therapy / biologic therapy for Crohn's. I suspect he probably had a partial obstruction given his recent symptoms. This resolved with liquid diet for 2 days, generally feeling back to his baseline. We discussed options moving forward. He has not had a hospitalization in over a year. He has declined maintenance medical therapy for his  Crohn's disease historically, and continues to decline this at present. I think that is reasonable given his age, and that has generally done well in the past year aside of this recent flare. However he is aware that he is at risk for recurrent obstructions and potential need for surgery in the future without any maintenance medical therapy for [place]. Moving forward recommend he stay on a low residual diet, I'll give him Bentyl to use as needed for cramps in the future. He is aware of obstructive symptoms and if this occurs he would need to go to the hospital. Otherwise if he has mild flares of Crohn's symptoms in the future we can consider using budesonide as needed, although we'll try to avoid steroids if at all possible ideally. He agreed.  [place] Cellar, MD [facility] Pager [phone]

## 2017-03-27 DIAGNOSIS — H11152 Pinguecula, left eye: Secondary | ICD-10-CM | POA: Diagnosis not present

## 2017-04-10 ENCOUNTER — Telehealth: Payer: Self-pay | Admitting: Gastroenterology

## 2017-04-10 NOTE — Telephone Encounter (Signed)
Pt's wife said Dr. Wynetta Emery has always prescribed B12 for her husband but he is retiring.  They are out of syringes and want to know if Dr. Loni Muse will send a script for 8 syringes to [facility] [place].  They have 8 more B12 doses but no more syringes.  Will want Dr. Loni Muse to write B12 script going forward once they run out in 8 months. Also, they would like to know when they should schedule a F/U appt. Please advise.

## 2017-04-10 NOTE — Telephone Encounter (Signed)
That's fine we can refill B12 and syringes. They can see me again in 6 months. Thanks

## 2017-04-11 NOTE — Telephone Encounter (Signed)
Called [facility] and gave orders for 8 syringes over the phone. Placed recall for OV in September 2019.

## 2017-04-13 DIAGNOSIS — L565 Disseminated superficial actinic porokeratosis (DSAP): Secondary | ICD-10-CM | POA: Diagnosis not present

## 2017-04-13 DIAGNOSIS — Z85828 Personal history of other malignant neoplasm of skin: Secondary | ICD-10-CM | POA: Diagnosis not present

## 2017-04-13 DIAGNOSIS — D692 Other nonthrombocytopenic purpura: Secondary | ICD-10-CM | POA: Diagnosis not present

## 2017-04-13 DIAGNOSIS — L821 Other seborrheic keratosis: Secondary | ICD-10-CM | POA: Diagnosis not present

## 2017-04-13 DIAGNOSIS — L570 Actinic keratosis: Secondary | ICD-10-CM | POA: Diagnosis not present

## 2017-04-13 DIAGNOSIS — L111 Transient acantholytic dermatosis [Grover]: Secondary | ICD-10-CM | POA: Diagnosis not present

## 2017-04-13 DIAGNOSIS — L820 Inflamed seborrheic keratosis: Secondary | ICD-10-CM | POA: Diagnosis not present

## 2017-04-20 DIAGNOSIS — H401133 Primary open-angle glaucoma, bilateral, severe stage: Secondary | ICD-10-CM | POA: Diagnosis not present

## 2017-05-31 ENCOUNTER — Encounter: Payer: Self-pay | Admitting: Internal Medicine

## 2017-05-31 ENCOUNTER — Ambulatory Visit (INDEPENDENT_AMBULATORY_CARE_PROVIDER_SITE_OTHER): Payer: PPO | Admitting: Internal Medicine

## 2017-05-31 VITALS — BP 122/84 | HR 85 | Temp 97.9°F | Ht 71.0 in | Wt 187.0 lb

## 2017-05-31 DIAGNOSIS — H9193 Unspecified hearing loss, bilateral: Secondary | ICD-10-CM | POA: Insufficient documentation

## 2017-05-31 NOTE — Progress Notes (Signed)
Subjective:    Patient ID: Michael [name], male    DOB: 10-16-1932, 84 y.o.   MRN: [id number]  HPI  Here with simple request for ear wax removal by irrigation per staff with hearing loss bilat for the past wk, without HA, sinus symptoms, vertigo, fever, ST, cough and Pt denies chest pain, increased sob or doe, wheezing, orthopnea, PND, increased LE swelling, palpitations, dizziness or syncope. Past Medical History:  Diagnosis Date  . Adrenal insufficiency ([place])   . Anemia   . Avascular necrosis ([place])   . Crohn's disease ([place])   . Glaucoma   . Inguinal hernia recurrent bilateral   . Low testosterone   . Osteopenia   . Retinal ischemia    Past Surgical History:  Procedure Laterality Date  . ABDOMINAL SURGERY    . COLONOSCOPY N/A 12/13/2015   Procedure: COLONOSCOPY;  Surgeon: Laurence Spates, MD;  [facility];  Service: Endoscopy;  Laterality: N/A;  . INGUINAL HERNIA REPAIR Bilateral 05/21/2014   Procedure: OPEN BILATERAL INGUINAL HERNIA REPAIRS WITH MESH;  Surgeon: Donnie Mesa, MD;  Location: [facility];  Service: General;  Laterality: Bilateral;  . SMALL INTESTINE SURGERY  1968, 2001    related to Chrohn's disease  . TONSILLECTOMY      reports that he quit smoking about 41 years ago. He has never used smokeless tobacco. He reports that he drinks alcohol. He reports that he does not use drugs. family history includes Deep vein thrombosis in his mother; Heart disease in his father. No Known Allergies Current Outpatient Medications on File Prior to Visit  Medication Sig Dispense Refill  . augmented betamethasone dipropionate (DIPROLENE-AF) 0.05 % cream     . brimonidine (ALPHAGAN) 0.2 % ophthalmic solution Place 1 drop into both eyes 2 (two) times daily.    . Calcium Carbonate-Vit D-Min (CALCIUM 1200 PO) Take by mouth daily.    . Cholecalciferol (VITAMIN D) 2000 units CAPS Take 1 capsule (2,000 Units total) by mouth daily. 30 capsule   . Cyanocobalamin  (VITAMIN B-12 IJ) Inject 1 mL as directed every 30 (thirty) days.     Marland Kitchen dicyclomine (BENTYL) 10 MG capsule Take 1 capsule (10 mg total) by mouth every 8 (eight) hours as needed for spasms. 30 capsule 1  . dorzolamide-timolol (COSOPT) 22.3-6.8 MG/ML ophthalmic solution Place 1 drop into both eyes 2 (two) times daily.    . IRON PO Take 325 mg by mouth 3 (three) times a week.     . latanoprost (XALATAN) 0.005 % ophthalmic solution Place 1 drop into the left eye at bedtime.     Mckinley Jewel Dimesylate (RHOPRESSA) 0.02 % SOLN Apply to eye at bedtime.    Marland Kitchen omeprazole (PRILOSEC) 20 MG capsule Take 20 mg by mouth daily. Before breakfast    . prednisoLONE acetate (PRED FORTE) 1 % ophthalmic suspension Place 1 drop into the right eye 4 (four) times daily.      No current facility-administered medications on file prior to visit.    Review of Systems All otherwise neg per pt    Objective:   Physical Exam BP 122/84   Pulse 85   Temp 97.9 F (36.6 C) (Oral)   Ht 5' 11"  (1.803 m)   Wt 187 lb (84.8 kg)   SpO2 97%   BMI 26.08 kg/m  VS noted,  Constitutional: Pt appears in NAD HENT: Head: NCAT.  Right Ear: External ear normal.  Left Ear: External ear normal.  Eyes: . Pupils are  equal, round, and reactive to light. Conjunctivae and EOM are normal bilat tms and canals clear without swelling or erythema and hearing improved after bilateral irrigation Nose: without d/c or deformity Neck: Neck supple. Gross normal ROM Cardiovascular: Normal rate and regular rhythm.   Pulmonary/Chest: Effort normal and breath sounds without rales or wheezing.  Neurological: Pt is alert. At baseline orientation, motor grossly intact Skin: Skin is warm. No rashes, other new lesions, no LE edema Psychiatric: Pt behavior is normal without agitation  No other exam findings    Assessment & Plan:

## 2017-05-31 NOTE — Assessment & Plan Note (Signed)
Much improved with irrigation,  to f/u any worsening symptoms or concerns

## 2017-05-31 NOTE — Patient Instructions (Signed)
Your ears were irrigated of wax blockages today  Please continue all other medications as before, and refills have been done if requested.  Please have the pharmacy call with any other refills you may need.  Please keep your appointments with your specialists as you may have planned

## 2017-06-02 ENCOUNTER — Ambulatory Visit: Payer: PPO | Admitting: Internal Medicine

## 2017-06-09 ENCOUNTER — Other Ambulatory Visit: Payer: Self-pay | Admitting: Gastroenterology

## 2017-06-09 NOTE — Telephone Encounter (Signed)
Gave verbal order for needles and syringes for patient's b12 to pharmacist.

## 2017-07-04 ENCOUNTER — Telehealth: Payer: Self-pay | Admitting: Internal Medicine

## 2017-07-04 DIAGNOSIS — Z79899 Other long term (current) drug therapy: Secondary | ICD-10-CM | POA: Diagnosis not present

## 2017-07-04 DIAGNOSIS — M8580 Other specified disorders of bone density and structure, unspecified site: Secondary | ICD-10-CM | POA: Diagnosis present

## 2017-07-04 DIAGNOSIS — Z87891 Personal history of nicotine dependence: Secondary | ICD-10-CM | POA: Diagnosis not present

## 2017-07-04 DIAGNOSIS — Z7952 Long term (current) use of systemic steroids: Secondary | ICD-10-CM | POA: Diagnosis not present

## 2017-07-04 DIAGNOSIS — H9193 Unspecified hearing loss, bilateral: Secondary | ICD-10-CM | POA: Diagnosis present

## 2017-07-04 DIAGNOSIS — H409 Unspecified glaucoma: Secondary | ICD-10-CM | POA: Diagnosis present

## 2017-07-04 DIAGNOSIS — E2740 Unspecified adrenocortical insufficiency: Secondary | ICD-10-CM | POA: Diagnosis present

## 2017-07-04 DIAGNOSIS — R109 Unspecified abdominal pain: Secondary | ICD-10-CM | POA: Diagnosis not present

## 2017-07-04 DIAGNOSIS — K50012 Crohn's disease of small intestine with intestinal obstruction: Principal | ICD-10-CM | POA: Diagnosis present

## 2017-07-04 DIAGNOSIS — K56609 Unspecified intestinal obstruction, unspecified as to partial versus complete obstruction: Secondary | ICD-10-CM | POA: Diagnosis not present

## 2017-07-04 DIAGNOSIS — R1110 Vomiting, unspecified: Secondary | ICD-10-CM | POA: Diagnosis not present

## 2017-07-04 DIAGNOSIS — K56699 Other intestinal obstruction unspecified as to partial versus complete obstruction: Secondary | ICD-10-CM | POA: Diagnosis not present

## 2017-07-04 NOTE — Telephone Encounter (Signed)
History carefully reviewed. Called tonight with 24 hours of typical obstructive symptoms. Vomited yesterday, not today. Distended and uncomfortable. Advised to go to ER for evaluation. May need admitted, IVF, steroids. Otherwise, call office in am for advise. Dr. Havery Moros discussed budesonide previously. Spoke to wife as well

## 2017-07-05 ENCOUNTER — Emergency Department (HOSPITAL_COMMUNITY): Payer: PPO

## 2017-07-05 ENCOUNTER — Inpatient Hospital Stay (HOSPITAL_COMMUNITY)
Admission: EM | Admit: 2017-07-05 | Discharge: 2017-07-06 | DRG: 386 | Disposition: A | Discharge status: Home / Self Care | Payer: PPO | Attending: Internal Medicine | Admitting: Internal Medicine

## 2017-07-05 ENCOUNTER — Encounter (HOSPITAL_COMMUNITY): Payer: Self-pay | Admitting: Emergency Medicine

## 2017-07-05 ENCOUNTER — Other Ambulatory Visit: Payer: Self-pay

## 2017-07-05 DIAGNOSIS — H9193 Unspecified hearing loss, bilateral: Secondary | ICD-10-CM | POA: Diagnosis present

## 2017-07-05 DIAGNOSIS — E2740 Unspecified adrenocortical insufficiency: Secondary | ICD-10-CM | POA: Diagnosis present

## 2017-07-05 DIAGNOSIS — M8580 Other specified disorders of bone density and structure, unspecified site: Secondary | ICD-10-CM | POA: Diagnosis present

## 2017-07-05 DIAGNOSIS — K50012 Crohn's disease of small intestine with intestinal obstruction: Secondary | ICD-10-CM | POA: Diagnosis present

## 2017-07-05 DIAGNOSIS — Z79899 Other long term (current) drug therapy: Secondary | ICD-10-CM | POA: Diagnosis not present

## 2017-07-05 DIAGNOSIS — K56609 Unspecified intestinal obstruction, unspecified as to partial versus complete obstruction: Secondary | ICD-10-CM | POA: Diagnosis present

## 2017-07-05 DIAGNOSIS — Z87891 Personal history of nicotine dependence: Secondary | ICD-10-CM | POA: Diagnosis not present

## 2017-07-05 DIAGNOSIS — K5090 Crohn's disease, unspecified, without complications: Secondary | ICD-10-CM | POA: Diagnosis present

## 2017-07-05 DIAGNOSIS — H409 Unspecified glaucoma: Secondary | ICD-10-CM | POA: Diagnosis present

## 2017-07-05 DIAGNOSIS — Z7952 Long term (current) use of systemic steroids: Secondary | ICD-10-CM | POA: Diagnosis not present

## 2017-07-05 LAB — BASIC METABOLIC PANEL
Anion gap: 7 (ref 5–15)
BUN: 15 mg/dL (ref 8–23)
CALCIUM: 8.7 mg/dL — AB (ref 8.9–10.3)
CHLORIDE: 107 mmol/L (ref 98–111)
CO2: 26 mmol/L (ref 22–32)
Creatinine, Ser: 1.17 mg/dL (ref 0.61–1.24)
GFR, EST NON AFRICAN AMERICAN: 56 mL/min — AB (ref >60)
Glucose, Bld: 123 mg/dL — ABNORMAL HIGH (ref 70–99)
Potassium: 3.8 mmol/L (ref 3.5–5.1)
Sodium: 140 mmol/L (ref 135–145)

## 2017-07-05 LAB — CBC WITH DIFFERENTIAL/PLATELET
BASOS ABS: 0 10*3/uL (ref 0.0–0.1)
Basophils Relative: 0 %
EOS ABS: 0.2 10*3/uL (ref 0.0–0.7)
EOS PCT: 3 %
HCT: 37.8 % — ABNORMAL LOW (ref 39.0–52.0)
Hemoglobin: 13 g/dL (ref 13.0–17.0)
Lymphocytes Relative: 21 %
Lymphs Abs: 1.6 10*3/uL (ref 0.7–4.0)
MCH: 31.8 pg (ref 26.0–34.0)
MCHC: 34.4 g/dL (ref 30.0–36.0)
MCV: 92.4 fL (ref 78.0–100.0)
MONO ABS: 0.8 10*3/uL (ref 0.1–1.0)
Monocytes Relative: 10 %
Neutro Abs: 5.2 10*3/uL (ref 1.7–7.7)
Neutrophils Relative %: 66 %
PLATELETS: 208 10*3/uL (ref 150–400)
RBC: 4.09 MIL/uL — AB (ref 4.22–5.81)
RDW: 13.3 % (ref 11.5–15.5)
WBC: 7.8 10*3/uL (ref 4.0–10.5)

## 2017-07-05 LAB — I-STAT CG4 LACTIC ACID, ED
LACTIC ACID, VENOUS: 0.61 mmol/L (ref 0.5–1.9)
Lactic Acid, Venous: 0.78 mmol/L (ref 0.5–1.9)

## 2017-07-05 MED ORDER — METHYLPREDNISOLONE SODIUM SUCC 40 MG IJ SOLR
40.0000 mg | Freq: Two times a day (BID) | INTRAMUSCULAR | Status: DC
  Administered 2017-07-05 – 2017-07-06 (×3): 40 mg via INTRAVENOUS
  Filled 2017-07-05 (×3): qty 1

## 2017-07-05 MED ORDER — LATANOPROST 0.005 % OP SOLN
1.0000 [drp] | Freq: Every day | OPHTHALMIC | Status: DC
  Administered 2017-07-05: 1 [drp] via OPHTHALMIC
  Filled 2017-07-05: qty 2.5

## 2017-07-05 MED ORDER — HYDROMORPHONE HCL 1 MG/ML IJ SOLN
0.5000 mg | INTRAMUSCULAR | Status: DC | PRN
  Administered 2017-07-05: 1 mg via INTRAVENOUS
  Filled 2017-07-05: qty 1

## 2017-07-05 MED ORDER — KCL-LACTATED RINGERS-D5W 20 MEQ/L IV SOLN
INTRAVENOUS | Status: DC
Start: 2017-07-05 — End: 2017-07-06
  Administered 2017-07-05: 21:00:00 via INTRAVENOUS
  Administered 2017-07-06: 1000 mL via INTRAVENOUS
  Filled 2017-07-05 (×3): qty 1000

## 2017-07-05 MED ORDER — ONDANSETRON HCL 4 MG/2ML IJ SOLN: 4.0000 mg | Freq: Four times a day (QID) | INTRAMUSCULAR | Status: DC | PRN

## 2017-07-05 MED ORDER — IOPAMIDOL (ISOVUE-300) INJECTION 61%
100.0000 mL | Freq: Once | INTRAVENOUS | Status: AC | PRN
  Administered 2017-07-05: 100 mL via INTRAVENOUS

## 2017-07-05 MED ORDER — ACETAMINOPHEN 325 MG PO TABS: 650.0000 mg | ORAL_TABLET | Freq: Four times a day (QID) | ORAL | Status: DC | PRN

## 2017-07-05 MED ORDER — ACETAMINOPHEN 650 MG RE SUPP: 650.0000 mg | Freq: Four times a day (QID) | RECTAL | Status: DC | PRN

## 2017-07-05 MED ORDER — IOPAMIDOL (ISOVUE-300) INJECTION 61%
INTRAVENOUS | Status: AC
  Administered 2017-07-05: 03:00:00
  Filled 2017-07-05: qty 100

## 2017-07-05 MED ORDER — ONDANSETRON HCL 4 MG/2ML IJ SOLN
4.0000 mg | Freq: Once | INTRAMUSCULAR | Status: AC
  Administered 2017-07-05: 4 mg via INTRAVENOUS
  Filled 2017-07-05: qty 2

## 2017-07-05 MED ORDER — SODIUM CHLORIDE 0.9 % IV SOLN
INTRAVENOUS | Status: DC
  Administered 2017-07-05 (×2): via INTRAVENOUS

## 2017-07-05 MED ORDER — HYDROMORPHONE HCL 1 MG/ML IJ SOLN: 1.0000 mg | INTRAMUSCULAR | Status: DC | PRN

## 2017-07-05 MED ORDER — SODIUM CHLORIDE 0.9 % IV BOLUS
1000.0000 mL | Freq: Once | INTRAVENOUS | Status: AC
  Administered 2017-07-05: 1000 mL via INTRAVENOUS

## 2017-07-05 MED ORDER — DORZOLAMIDE HCL-TIMOLOL MAL 2-0.5 % OP SOLN
1.0000 [drp] | Freq: Two times a day (BID) | OPHTHALMIC | Status: DC
  Administered 2017-07-05 – 2017-07-06 (×2): 1 [drp] via OPHTHALMIC
  Filled 2017-07-05: qty 10

## 2017-07-05 MED ORDER — ENOXAPARIN SODIUM 40 MG/0.4ML ~~LOC~~ SOLN
40.0000 mg | SUBCUTANEOUS | Status: DC
  Administered 2017-07-05 – 2017-07-06 (×2): 40 mg via SUBCUTANEOUS
  Filled 2017-07-05 (×2): qty 0.4

## 2017-07-05 MED ORDER — ONDANSETRON HCL 4 MG PO TABS: 4.0000 mg | ORAL_TABLET | Freq: Four times a day (QID) | ORAL | Status: DC | PRN

## 2017-07-05 MED ORDER — HYDROMORPHONE HCL 1 MG/ML IJ SOLN
1.0000 mg | Freq: Once | INTRAMUSCULAR | Status: AC
  Administered 2017-07-05: 1 mg via INTRAVENOUS
  Filled 2017-07-05: qty 1

## 2017-07-05 MED ORDER — PREDNISOLONE ACETATE 1 % OP SUSP
1.0000 [drp] | Freq: Four times a day (QID) | OPHTHALMIC | Status: DC
  Administered 2017-07-05 – 2017-07-06 (×5): 1 [drp] via OPHTHALMIC
  Filled 2017-07-05: qty 5

## 2017-07-05 NOTE — Progress Notes (Signed)
Michael [name] is a 84 year old male with past medical history significant for Crohn's disease who presented to ED [facility] with complaints of abdominal pain of 1 day duration associated with nausea and vomiting.  CT abdomen and pelvis revealed small bowel obstruction and inflammation at the terminal ileum.  Admitted for SBO.  Patient seen and examined at his bedside.  Reports abdominal pain is improved.  Denies flatus or bowel movements.  GI consulted.  Highly appreciated.  Please refer to H&P dictated by Dr. Alcario Drought on 07/05/2017 for further details of the assessment and plan.

## 2017-07-05 NOTE — Progress Notes (Signed)
Pt not tolerating NG tube placement, even after IV dilaudid, requesting something else to "put him to sleep" MD notified. Instructed to hold off on NG placement unless patient begins vomiting. At this time pt's abdomen is distended, bowel sounds are active in all quadrants, pt has no complaints of nausea. Will continue to monitor at this time.

## 2017-07-05 NOTE — Telephone Encounter (Signed)
Patient admitted

## 2017-07-05 NOTE — ED Notes (Signed)
ED TO INPATIENT HANDOFF REPORT  Name/Age/Gender Michael [name] 84 y.o. male  Code Status    Code Status Orders  (From admission, onward)        Start     Ordered   07/05/17 0343  Full code  Continuous     07/05/17 0344    Code Status History    Date Active Date Inactive Code Status Order ID Comments User Context   12/12/2015 0240 12/13/2015 1447 Full Code [id number]  Lily Kocher, MD Inpatient    Advance Directive Documentation     Most Recent Value  Type of Advance Directive  Healthcare Power of Attorney, Living will  Pre-existing out of facility DNR order (yellow form or pink MOST form)  -  "MOST" Form in Place?  -      Home/SNF/Other Home  Chief Complaint Crohn's   Level of Care/Admitting Diagnosis ED Disposition    ED Disposition Condition [place]: [place] [[id number]]  Level of Care: Med-Surg [16]  Diagnosis: SBO (small bowel obstruction) [facility]) [[id number]]  Admitting Physician: Doreatha Massed  Attending Physician: Etta Quill [phone]  Estimated length of stay: past midnight tomorrow  Certification:: I certify this patient will need inpatient services for at least 2 midnights  PT Class (Do Not Modify): Inpatient [101]  PT Acc Code (Do Not Modify): Private [1]       Medical History Past Medical History:  Diagnosis Date  . Adrenal insufficiency ([place])   . Anemia   . Avascular necrosis ([place])   . Crohn's disease ([place])   . Glaucoma   . Inguinal hernia recurrent bilateral   . Low testosterone   . Osteopenia   . Retinal ischemia     Allergies No Known Allergies  IV Location/Drains/Wounds Patient Lines/Drains/Airways Status   Active Line/Drains/Airways    Name:   Placement date:   Placement time:   Site:   Days:   Peripheral IV 07/05/17 Left Antecubital   07/05/17    0055    Antecubital   less than 1   Incision (Closed) 05/21/14 Abdomen Left   05/21/14    1328     1141   Incision (Closed)  05/21/14 Abdomen Right   05/21/14    1328     1141          Labs/Imaging Results for orders placed or performed during the hospital encounter of 07/05/17 (from the past 48 hour(s))  CBC with Differential/Platelet     Status: Abnormal   Collection Time: 07/05/17 12:47 AM  Result Value Ref Range   WBC 7.8 4.0 - 10.5 K/uL   RBC 4.09 (L) 4.22 - 5.81 MIL/uL   Hemoglobin 13.0 13.0 - 17.0 g/dL   HCT 37.8 (L) 39.0 - 52.0 %   MCV 92.4 78.0 - 100.0 fL   MCH 31.8 26.0 - 34.0 pg   MCHC 34.4 30.0 - 36.0 g/dL   RDW 13.3 11.5 - 15.5 %   Platelets 208 150 - 400 K/uL   Neutrophils Relative % 66 %   Neutro Abs 5.2 1.7 - 7.7 K/uL   Lymphocytes Relative 21 %   Lymphs Abs 1.6 0.7 - 4.0 K/uL   Monocytes Relative 10 %   Monocytes Absolute 0.8 0.1 - 1.0 K/uL   Eosinophils Relative 3 %   Eosinophils Absolute 0.2 0.0 - 0.7 K/uL   Basophils Relative 0 %   Basophils Absolute 0.0 0.0 - 0.1 K/uL  Comment: Performed at [facility], [place] [address], [place], [place] [address]  Basic metabolic panel     Status: Abnormal   Collection Time: 07/05/17 12:47 AM  Result Value Ref Range   Sodium 140 135 - 145 mmol/L   Potassium 3.8 3.5 - 5.1 mmol/L   Chloride 107 98 - 111 mmol/L    Comment: Please note change in reference range.   CO2 26 22 - 32 mmol/L   Glucose, Bld 123 (H) 70 - 99 mg/dL    Comment: Please note change in reference range.   BUN 15 8 - 23 mg/dL    Comment: Please note change in reference range.   Creatinine, Ser 1.17 0.61 - 1.24 mg/dL   Calcium 8.7 (L) 8.9 - 10.3 mg/dL   GFR calc non Af Amer 56 (L) >60 mL/min   GFR calc Af Amer >60 >60 mL/min    Comment: (NOTE) The eGFR has been calculated using the CKD EPI equation. This calculation has not been validated in all clinical situations. eGFR's persistently <60 mL/min signify possible Chronic Kidney Disease.    Anion gap 7 5 - 15    Comment: Performed at [facility], [place] [address], [place],  [place] [address]  I-Stat CG4 Lactic Acid, ED     Status: None   Collection Time: 07/05/17  1:05 AM  Result Value Ref Range   Lactic Acid, Venous 0.78 0.5 - 1.9 mmol/L  I-Stat CG4 Lactic Acid, ED     Status: None   Collection Time: 07/05/17  3:27 AM  Result Value Ref Range   Lactic Acid, Venous 0.61 0.5 - 1.9 mmol/L   Ct Abdomen Pelvis W Contrast  Result Date: 07/05/2017 CLINICAL DATA:  84 y/o M; abdominal pain and distention, nausea, vomiting, concern for small bowel obstruction. History of Crohn's disease. EXAM: CT ABDOMEN AND PELVIS WITH CONTRAST TECHNIQUE: Multidetector CT imaging of the abdomen and pelvis was performed using the standard protocol following bolus administration of intravenous contrast. CONTRAST:  185m ISOVUE-300 IOPAMIDOL (ISOVUE-300) INJECTION 61% COMPARISON:  05/17/2016 CT abdomen and pelvis. FINDINGS: Lower chest: No acute abnormality. Hepatobiliary: No focal liver abnormality is seen. No gallstones, gallbladder wall thickening, or biliary dilatation. Pancreas: Unremarkable. No pancreatic ductal dilatation or surrounding inflammatory changes. Spleen: Normal in size without focal abnormality. Adrenals/Urinary Tract: Adrenal glands are unremarkable. Multiple bilateral renal cysts are stable. Kidneys are otherwise normal, without renal calculi, focal lesion, or hydronephrosis. Bladder is unremarkable. Stomach/Bowel: Mucosal enhancement of the terminal ileum with stricture. Upstream small bowel obstruction. Postsurgical changes of the cecum. No wall thickening or inflammatory change of the colon identified. Stomach is unremarkable. Vascular/Lymphatic: Aortic atherosclerosis. No enlarged abdominal or pelvic lymph nodes. Reproductive: Prostate is unremarkable. Other: No abdominal wall hernia or abnormality. No abdominopelvic ascites. 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 Stable multilevel degenerative changes of the spine greatest at the L5-S1  level. Stable T12 mild compression deformity. IMPRESSION: 1. Terminal ileum long segment of stricture with active inflammation compatible with history of Crohn's disease. 2. Small bowel obstruction upstream to the strictured terminal ileum. Electronically Signed   By: LKristine GarbeM.D.   On: 07/05/2017 03:14    Pending Labs Unresulted Labs (From admission, onward)   None      Vitals/Pain Today's Vitals   07/05/17 0008 07/05/17 0017 07/05/17 0018 07/05/17 0155  BP:  131/88    Pulse:  77    Resp:  17  Temp:      TempSrc:      SpO2:  96%    Weight: 183 lb (83 kg)     Height: _0  (1.803 m)     PainSc:   10-Worst pain ever 0-No pain    Isolation Precautions No active isolations  Medications Medications  iopamidol (ISOVUE-300) 61 % injection (has no administration in time range)  dorzolamide-timolol (COSOPT) 22.3-6.8 MG/ML ophthalmic solution 1 drop (has no administration in time range)  latanoprost (XALATAN) 0.005 % ophthalmic solution 1 drop (has no administration in time range)  prednisoLONE acetate (PRED FORTE) 1 % ophthalmic suspension 1 drop (has no administration in time range)  HYDROmorphone (DILAUDID) injection 1 mg (has no administration in time range)  methylPREDNISolone sodium succinate (SOLU-MEDROL) 40 mg/mL injection 40 mg (has no administration in time range)  acetaminophen (TYLENOL) tablet 650 mg (has no administration in time range)    Or  acetaminophen (TYLENOL) suppository 650 mg (has no administration in time range)  ondansetron (ZOFRAN) tablet 4 mg (has no administration in time range)    Or  ondansetron (ZOFRAN) injection 4 mg (has no administration in time range)  enoxaparin (LOVENOX) injection 40 mg (has no administration in time range)  0.9 %  sodium chloride infusion (has no administration in time range)  HYDROmorphone (DILAUDID) injection 1 mg (1 mg Intravenous Given 07/05/17 0118)  ondansetron (ZOFRAN) injection 4 mg (4 mg Intravenous  Given 07/05/17 0116)  sodium chloride 0.9 % bolus 1,000 mL (0 mLs Intravenous Stopped 07/05/17 0314)  iopamidol (ISOVUE-300) 61 % injection 100 mL (100 mLs Intravenous Contrast Given 07/05/17 0230)    Mobility Walks (needs help getting out of bed)

## 2017-07-05 NOTE — Plan of Care (Signed)
Plan of care discussed with patient

## 2017-07-05 NOTE — H&P (Signed)
History and Physical    Michael [name] CBU:[id number] DOB: 09-05-1932 DOA: 07/05/2017  PCP: Michael Rail, MD  Patient coming from: Home  I have personally briefly reviewed patient's old medical records in [place]  Chief Complaint: Abd pain  HPI: Michael [name] is a 84 y.o. male with medical history significant of Crohn's disease.  Patient presents to the ED with 24 hours symptoms.  Specifically he had N/V after dinner last night, followed by ABD pain all day today, abd distention.  Called GI, and Dr. Henrene Pastor sent him in to ED due to concern for obstructive symptoms.   ED Course: CT scan confirms SBO which is due to long segment active Crohn's inflmation in terminal ileum.   Review of Systems: As per HPI otherwise 10 point review of systems negative.   Past Medical History:  Diagnosis Date  . Adrenal insufficiency ([place])   . Anemia   . Avascular necrosis ([place])   . Crohn's disease ([place])   . Glaucoma   . Inguinal hernia recurrent bilateral   . Low testosterone   . Osteopenia   . Retinal ischemia     Past Surgical History:  Procedure Laterality Date  . ABDOMINAL SURGERY    . COLONOSCOPY N/A 12/13/2015   Procedure: COLONOSCOPY;  Surgeon: Laurence Spates, MD;  Location: [facility];  Service: Endoscopy;  Laterality: N/A;  . INGUINAL HERNIA REPAIR Bilateral 05/21/2014   Procedure: OPEN BILATERAL INGUINAL HERNIA REPAIRS WITH MESH;  Surgeon: Donnie Mesa, MD;  Location: [place];  Service: General;  Laterality: Bilateral;  . SMALL INTESTINE SURGERY  1968, 2001    related to Chrohn's disease  . TONSILLECTOMY       reports that he quit smoking about 41 years ago. He has never used smokeless tobacco. He reports that he drinks alcohol. He reports that he does not use drugs.  No Known Allergies  Family History  Problem Relation Age of Onset  . Deep vein thrombosis Mother   . Heart disease Father      Prior to Admission medications   Medication  Sig Start Date End Date Taking? Authorizing Provider  augmented betamethasone dipropionate (DIPROLENE-AF) 0.05 % cream  01/24/17  Yes [provider]  brimonidine (ALPHAGAN) 0.2 % ophthalmic solution Place 1 drop into both eyes 2 (two) times daily. 11/20/15  Yes [provider]  Calcium Carbonate-Vit D-Min (CALCIUM 1200 PO) Take by mouth daily.   Yes [provider]  Cholecalciferol (VITAMIN D) 2000 units CAPS Take 1 capsule (2,000 Units total) by mouth daily. 08/22/16  Yes Armbruster, Carlota Raspberry, MD  Cyanocobalamin (VITAMIN B-12 IJ) Inject 1 mL as directed every 30 (thirty) days.    Yes [provider]  dorzolamide-timolol (COSOPT) 22.3-6.8 MG/ML ophthalmic solution Place 1 drop into both eyes 2 (two) times daily. 11/05/15  Yes [provider]  fluocinonide cream (LIDEX) 0.32 % Apply 1 application topically 2 (two) times daily as needed (Grover's disease).   Yes [provider]  IRON PO Take 325 mg by mouth 3 (three) times a week.    Yes [provider]  latanoprost (XALATAN) 0.005 % ophthalmic solution Place 1 drop into the left eye at bedtime.    Yes [provider]  Netarsudil Dimesylate (RHOPRESSA) 0.02 % SOLN Apply to eye at bedtime.   Yes [provider]  omeprazole (PRILOSEC) 20 MG capsule Take 20 mg by mouth daily. Before breakfast   Yes [provider]  prednisoLONE acetate (PRED  FORTE) 1 % ophthalmic suspension Place 1 drop into the right eye 4 (four) times daily.  11/16/15  Yes [provider]  Probiotic Product (TRUNATURE DIGESTIVE PROBIOTIC PO) Take 1 capsule by mouth daily.   Yes [provider]    Physical Exam: Vitals:   07/04/17 2343 07/05/17 0008 07/05/17 0017  BP: (!) 158/84  131/88  Pulse: 86  77  Resp: 18  17  Temp: (!) 97.5 F (36.4 C)    TempSrc: Oral    SpO2: 96%  96%  Weight:  83 kg (183 lb)   Height:  5' 11"  (1.803 m)     Constitutional: NAD, calm,  comfortable Eyes: PERRL, lids and conjunctivae normal ENMT: Mucous membranes are moist. Posterior pharynx clear of any exudate or lesions.Normal dentition.  Neck: normal, supple, no masses, no thyromegaly Respiratory: clear to auscultation bilaterally, no wheezing, no crackles. Normal respiratory effort. No accessory muscle use.  Cardiovascular: Regular rate and rhythm, no murmurs / rubs / gallops. No extremity edema. 2+ pedal pulses. No carotid bruits.  Abdomen: Distended, mild diffuse tenderness, no guarding, no rebound Musculoskeletal: no clubbing / cyanosis. No joint deformity upper and lower extremities. Good ROM, no contractures. Normal muscle tone.  Skin: no rashes, lesions, ulcers. No induration Neurologic: CN 2-12 grossly intact. Sensation intact, DTR normal. Strength 5/5 in all 4.  Psychiatric: Normal judgment and insight. Alert and oriented x 3. Normal mood.    Labs on Admission: I have personally reviewed following labs and imaging studies  CBC: Recent Labs  Lab 07/05/17 0047  WBC 7.8  NEUTROABS 5.2  HGB 13.0  HCT 37.8*  MCV 92.4  PLT 626   Basic Metabolic Panel: Recent Labs  Lab 07/05/17 0047  NA 140  K 3.8  CL 107  CO2 26  GLUCOSE 123*  BUN 15  CREATININE 1.17  CALCIUM 8.7*   GFR: Estimated Creatinine Clearance: 51 mL/min (by C-G formula based on SCr of 1.17 mg/dL). Liver Function Tests: No results for input(s): AST, ALT, ALKPHOS, BILITOT, PROT, ALBUMIN in the last 168 hours. No results for input(s): LIPASE, AMYLASE in the last 168 hours. No results for input(s): AMMONIA in the last 168 hours. Coagulation Profile: No results for input(s): INR, PROTIME in the last 168 hours. Cardiac Enzymes: No results for input(s): CKTOTAL, CKMB, CKMBINDEX, TROPONINI in the last 168 hours. BNP (last 3 results) No results for input(s): PROBNP in the last 8760 hours. HbA1C: No results for input(s): HGBA1C in the last 72 hours. CBG: No results for input(s): GLUCAP in  the last 168 hours. Lipid Profile: No results for input(s): CHOL, HDL, LDLCALC, TRIG, CHOLHDL, LDLDIRECT in the last 72 hours. Thyroid Function Tests: No results for input(s): TSH, T4TOTAL, FREET4, T3FREE, THYROIDAB in the last 72 hours. Anemia Panel: No results for input(s): VITAMINB12, FOLATE, FERRITIN, TIBC, IRON, RETICCTPCT in the last 72 hours. Urine analysis:    Component Value Date/Time   COLORURINE YELLOW 12/11/2015 2246   APPEARANCEUR CLEAR 12/11/2015 2246   LABSPEC 1.023 12/11/2015 2246   PHURINE 6.5 12/11/2015 2246   GLUCOSEU NEGATIVE 12/11/2015 2246   HGBUR NEGATIVE 12/11/2015 2246   BILIRUBINUR NEGATIVE 12/11/2015 2246   KETONESUR NEGATIVE 12/11/2015 2246   PROTEINUR NEGATIVE 12/11/2015 2246   NITRITE NEGATIVE 12/11/2015 2246   LEUKOCYTESUR NEGATIVE 12/11/2015 2246    Radiological Exams on Admission: Ct Abdomen Pelvis W Contrast  Result Date: 07/05/2017 CLINICAL DATA:  84 y/o M; abdominal pain and distention, nausea, vomiting, concern for small bowel obstruction. History  of Crohn's disease. EXAM: CT ABDOMEN AND PELVIS WITH CONTRAST TECHNIQUE: Multidetector CT imaging of the abdomen and pelvis was performed using the standard protocol following bolus administration of intravenous contrast. CONTRAST:  163m ISOVUE-300 IOPAMIDOL (ISOVUE-300) INJECTION 61% COMPARISON:  05/17/2016 CT abdomen and pelvis. FINDINGS: Lower chest: No acute abnormality. Hepatobiliary: No focal liver abnormality is seen. No gallstones, gallbladder wall thickening, or biliary dilatation. Pancreas: Unremarkable. No pancreatic ductal dilatation or surrounding inflammatory changes. Spleen: Normal in size without focal abnormality. Adrenals/Urinary Tract: Adrenal glands are unremarkable. Multiple bilateral renal cysts are stable. Kidneys are otherwise normal, without renal calculi, focal lesion, or hydronephrosis. Bladder is unremarkable. Stomach/Bowel: Mucosal enhancement of the terminal ileum with stricture.  Upstream small bowel obstruction. Postsurgical changes of the cecum. No wall thickening or inflammatory change of the colon identified. Stomach is unremarkable. Vascular/Lymphatic: Aortic atherosclerosis. No enlarged abdominal or pelvic lymph nodes. Reproductive: Prostate is unremarkable. Other: No abdominal wall hernia or abnormality. No abdominopelvic ascites. 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 Stable multilevel degenerative changes of the spine greatest at the L5-S1 level. Stable T12 mild compression deformity. IMPRESSION: 1. Terminal ileum long segment of stricture with active inflammation compatible with history of Crohn's disease. 2. Small bowel obstruction upstream to the strictured terminal ileum. Electronically Signed   By: LKristine GarbeM.D.   On: 07/05/2017 03:14    EKG: Independently reviewed.  Assessment/Plan Principal Problem:   SBO (small bowel obstruction) (HCC) Active Problems:   [place]'s disease ([place]    1. SBO - 1. NPO 2. IVF 3. Dilaudid for pain 4. NGT 2. Crohn's disease with flare - 1. Will put on solumedrol 448mBID IV for the moment 2. Call GI in AM  DVT prophylaxis: Lovenox Code Status: Full Family Communication: Family at bedside Disposition Plan: Home after admit Consults called: None, call [facility] in AM Admission status: Admit to inpatient - IP status for treatment of SBO with NGT   GAEtta QuillO [facility] Pager [phone] works nights!  If 7AM-7PM, please contact the primary day team physician taking care of patient  [email or web address] Password [email or web address]  07/05/2017, 4:08 AM

## 2017-07-05 NOTE — ED Triage Notes (Signed)
Pt arriving with Crohn's disease flare up x7 hours

## 2017-07-05 NOTE — Consult Note (Addendum)
Consultation  Referring Provider: Dr. Nevada Crane     Primary Care Physician:  Binnie Rail, MD Primary Gastroenterologist: Dr. Havery Moros        Reason for Consultation: Abnormal Ct Abdomen, Crohn's disease with small bowel obstruction            HPI:   Michael [name] is an 84 y.o. male with past medical history as below, who follows with Dr. Havery Moros for fibro-stenosing ileal Crohn's diagnosed in 1966, who presented to the ER on 07/05/2017 for abdominal pain.    Today, explains he went out to dinner on 07/03/2017 and that night had severe nausea and vomiting.  6/25 he woke up feeling okay and had breakfast but around 1:00 pm started with severe abdominal pain and distention.  Does note a normal bowel movement that morning.  Since then has continued with bloating and abdominal distention which is some better overnight.  Did pass gas about 2 hours ago which "helped to take the tension off".  Denies feeling nauseous or any further vomiting.  Has been n.p.o. today.  Describes NG tube was tried this morning but he could not tolerate it.    Denies fever, chills, weight loss, anorexia or symptoms that awaken him at night.  ED Course: CT scan confirms SBO which is due to long segment active Crohn's inflmation in terminal ileum.  GI history: 03/07/2017 office visit Dr. Nicola Police patient had 2 operations for his Crohn's in 1968 and then again 2001 for small bowel resections, had been treated with prednisone as needed in the past, had never been on any other therapy for Crohn's disease, hospitalizations in the past for bowel obstructions, last a few years ago-complications from steroid use followed by endocrinology for adrenal insufficiency as well as hepatic steatosis and vascular necrosis of the hips on CT scan; at that time discussed using budesonide as needed for flares in the future 12/2015 colonoscopy- normal colon, patent ileocolonic anastomosis, active Crohn's ileitis  Past Medical  History:  Diagnosis Date  . Adrenal insufficiency ([place])   . Anemia   . Avascular necrosis ([place])   . Crohn's disease ([place])   . Glaucoma   . Inguinal hernia recurrent bilateral   . Low testosterone   . Osteopenia   . Retinal ischemia     Past Surgical History:  Procedure Laterality Date  . ABDOMINAL SURGERY    . COLONOSCOPY N/A 12/13/2015   Procedure: COLONOSCOPY;  Surgeon: Laurence Spates, MD;  Location: [facility];  Service: Endoscopy;  Laterality: N/A;  . INGUINAL HERNIA REPAIR Bilateral 05/21/2014   Procedure: OPEN BILATERAL INGUINAL HERNIA REPAIRS WITH MESH;  Surgeon: Donnie Mesa, MD;  Location: [facility];  Service: General;  Laterality: Bilateral;  . SMALL INTESTINE SURGERY  1968, 2001    related to Chrohn's disease  . TONSILLECTOMY      Family History  Problem Relation Age of Onset  . Deep vein thrombosis Mother   . Heart disease Father      Social History   Tobacco Use  . Smoking status: Former Smoker    Last attempt to quit: 04/01/1976    Years since quitting: 41.2  . Smokeless tobacco: Never Used  Substance Use Topics  . Alcohol use: Yes    Comment: 2-3  glass wine  weekly  1-2 cans beer/ week  . Drug use: No    Prior to Admission medications   Medication Sig Start Date End Date Taking? Authorizing Provider  augmented betamethasone dipropionate (  DIPROLENE-AF) 0.05 % cream  01/24/17  Yes [provider]  brimonidine (ALPHAGAN) 0.2 % ophthalmic solution Place 1 drop into both eyes 2 (two) times daily. 11/20/15  Yes [provider]  Calcium Carbonate-Vit D-Min (CALCIUM 1200 PO) Take by mouth daily.   Yes [provider]  Cholecalciferol (VITAMIN D) 2000 units CAPS Take 1 capsule (2,000 Units total) by mouth daily. 08/22/16  Yes Armbruster, Carlota Raspberry, MD  Cyanocobalamin (VITAMIN B-12 IJ) Inject 1 mL as directed every 30 (thirty) days.    Yes [provider]  dorzolamide-timolol (COSOPT) 22.3-6.8 MG/ML ophthalmic  solution Place 1 drop into both eyes 2 (two) times daily. 11/05/15  Yes [provider]  fluocinonide cream (LIDEX) 7.62 % Apply 1 application topically 2 (two) times daily as needed (Grover's disease).   Yes [provider]  IRON PO Take 325 mg by mouth 3 (three) times a week.    Yes [provider]  latanoprost (XALATAN) 0.005 % ophthalmic solution Place 1 drop into the left eye at bedtime.    Yes [provider]  Netarsudil Dimesylate (RHOPRESSA) 0.02 % SOLN Apply to eye at bedtime.   Yes [provider]  omeprazole (PRILOSEC) 20 MG capsule Take 20 mg by mouth daily. Before breakfast   Yes [provider]  prednisoLONE acetate (PRED FORTE) 1 % ophthalmic suspension Place 1 drop into the right eye 4 (four) times daily.  11/16/15  Yes [provider]  Probiotic Product (TRUNATURE DIGESTIVE PROBIOTIC PO) Take 1 capsule by mouth daily.   Yes [provider]    Current Facility-Administered Medications  Medication Dose Route Frequency Provider Last Rate Last Dose  . 0.9 %  sodium chloride infusion   Intravenous Continuous Etta Quill, DO 100 mL/hr at 07/05/17 0454    . acetaminophen (TYLENOL) tablet 650 mg  650 mg Oral Q6H PRN Etta Quill, DO       Or  . acetaminophen (TYLENOL) suppository 650 mg  650 mg Rectal Q6H PRN Etta Quill, DO      . dorzolamide-timolol (COSOPT) 22.3-6.8 MG/ML ophthalmic solution 1 drop  1 drop Both Eyes BID Jennette Kettle M, DO      . enoxaparin (LOVENOX) injection 40 mg  40 mg Subcutaneous Q24H Jennette Kettle M, DO   40 mg at 07/05/17 0820  . HYDROmorphone (DILAUDID) injection 0.5-1 mg  0.5-1 mg Intravenous Q4H PRN Etta Quill, DO   1 mg at 07/05/17 0454  . latanoprost (XALATAN) 0.005 % ophthalmic solution 1 drop  1 drop Left Eye QHS Jennette Kettle M, DO      . methylPREDNISolone sodium succinate (SOLU-MEDROL) 40 mg/mL injection 40 mg  40 mg Intravenous Q12H Jennette Kettle M, DO    40 mg at 07/05/17 0453  . ondansetron (ZOFRAN) tablet 4 mg  4 mg Oral Q6H PRN Etta Quill, DO       Or  . ondansetron [facility]) injection 4 mg  4 mg Intravenous Q6H PRN Etta Quill, DO      . prednisoLONE acetate (PRED FORTE) 1 % ophthalmic suspension 1 drop  1 drop Right Eye QID Etta Quill, DO   Stopped at 07/05/17 1400    Allergies as of 07/04/2017  . (No Known Allergies)   Review of Systems:    Constitutional: No weight loss, fever or chills Skin: No rash  Cardiovascular: No chest pain  Respiratory: No SOB  Gastrointestinal: See HPI and otherwise negative Genitourinary: No dysuria or  change in urinary frequency Neurological: No headache, dizziness or syncope Musculoskeletal: No new muscle or joint pain Hematologic: No bleeding  Psychiatric: No history of depression or anxiety   Physical Exam:  Vital signs in last 24 hours: Temp:  [97.5 F (36.4 C)-98.9 F (37.2 C)] 98.9 F (37.2 C) (06/26 0442) Pulse Rate:  [56-86] 71 (06/26 1420) Resp:  [16-18] 17 (06/26 1420) BP: (122-158)/(76-94) 122/88 (06/26 1420) SpO2:  [63 %-96 %] 95 % (06/26 1420) Weight:  [183 lb (83 kg)] 183 lb (83 kg) (06/26 0008) Last BM Date: 07/04/17 General:   Pleasant Caucasian male appears to be in NAD, Well developed, Well nourished, alert and cooperative Head:  Normocephalic and atraumatic. Eyes:   PEERL, EOMI. No icterus. Conjunctiva pink. Ears:  Normal auditory acuity. Neck:  Supple Throat: Oral cavity and pharynx without inflammation, swelling or lesion.  Lungs: Respirations even and unlabored. Lungs clear to auscultation bilaterally.   No wheezes, crackles, or rhonchi.  Heart: Normal S1, S2. No MRG. Regular rate and rhythm. No peripheral edema, cyanosis or pallor.  Abdomen:  Soft, distended, mild generalized TTP. No rebound or guarding.decreased BS all four quads, No appreciable masses or hepatomegaly. Rectal:  Not performed.  Msk:  Symmetrical without gross deformities. Peripheral  pulses intact.  Extremities:  Without edema, no deformity or joint abnormality.  Neurologic:  Alert and  oriented x4;  grossly normal neurologically.  Skin:   Dry and intact without significant lesions or rashes. Psychiatric: Demonstrates good judgement and reason without abnormal affect or behaviors.   LAB RESULTS: Recent Labs    07/05/17 0047  WBC 7.8  HGB 13.0  HCT 37.8*  PLT 208   BMET Recent Labs    07/05/17 0047  NA 140  K 3.8  CL 107  CO2 26  GLUCOSE 123*  BUN 15  CREATININE 1.17  CALCIUM 8.7*   STUDIES: Ct Abdomen Pelvis W Contrast  Result Date: 07/05/2017 CLINICAL DATA:  84 y/o M; abdominal pain and distention, nausea, vomiting, concern for small bowel obstruction. History of Crohn's disease. EXAM: CT ABDOMEN AND PELVIS WITH CONTRAST TECHNIQUE: Multidetector CT imaging of the abdomen and pelvis was performed using the standard protocol following bolus administration of intravenous contrast. CONTRAST:  1100m ISOVUE-300 IOPAMIDOL (ISOVUE-300) INJECTION 61% COMPARISON:  05/17/2016 CT abdomen and pelvis. FINDINGS: Lower chest: No acute abnormality. Hepatobiliary: No focal liver abnormality is seen. No gallstones, gallbladder wall thickening, or biliary dilatation. Pancreas: Unremarkable. No pancreatic ductal dilatation or surrounding inflammatory changes. Spleen: Normal in size without focal abnormality. Adrenals/Urinary Tract: Adrenal glands are unremarkable. Multiple bilateral renal cysts are stable. Kidneys are otherwise normal, without renal calculi, focal lesion, or hydronephrosis. Bladder is unremarkable. Stomach/Bowel: Mucosal enhancement of the terminal ileum with stricture. Upstream small bowel obstruction. Postsurgical changes of the cecum. No wall thickening or inflammatory change of the colon identified. Stomach is unremarkable. Vascular/Lymphatic: Aortic atherosclerosis. No enlarged abdominal or pelvic lymph nodes. Reproductive: Prostate is unremarkable. Other: No  abdominal wall hernia or abnormality. No abdominopelvic ascites. 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 Stable multilevel degenerative changes of the spine greatest at the L5-S1 level. Stable T12 mild compression deformity. IMPRESSION: 1. Terminal ileum long segment of stricture with active inflammation compatible with history of Crohn's disease. 2. Small bowel obstruction upstream to the strictured terminal ileum. Electronically Signed   By: LKristine GarbeM.D.   On: 07/05/2017 03:14    Impression / Plan:   Impression: 1.  Small bowel obstruction:  passed gas 2 hours ago, continued abd distention-decreased per pt overnight; likely due to below 2.  Crohn's disease with flare: Currently on Solu-Medrol 40 mg twice daily, has declined maintenance therapy in the past, managed with prn steroids/budesonide outpatient  Plan: 1. Continue methylprednisolone 33m BID for now 2. NPO today-ice chips/sips with meds 3. Continue other supportive measures 4. Please await any further recommendations from Dr. GCarlean Purllater today  Thank you for your kind consultation, we will continue to follow.  Michael NianLemmon  07/05/2017, 2:40 PM Pager #: [phone]   [facility] Attending   I have taken an interval history, reviewed the chart and examined the patient. I agree with the Advanced Practitioner's note, impression and recommendations.   Allow ice chips  Change IVF to D%LR + K - NPO patients need D5  Follow-up tomorrow  CGatha Mayer MD, FOak[facility] 07/05/2017 5:20 PM

## 2017-07-05 NOTE — ED Provider Notes (Signed)
[facility] DEPT Provider Note   CSN: [id number] Arrival date & time: 07/04/17  2338     History   Chief Complaint Chief Complaint  Patient presents with  . Abdominal Pain    Crohn's    HPI Michael [name] is a 84 y.o. male.  Patient with past medical history remarkable for Crohn's disease presents to the emergency department with a chief complaint of abdominal pain.  He reports that he has been having increasing pain in his abdomen over the past day.  States that he called his gastroenterologist, who advised that he come to the emergency department for evaluation.  Patient reports feeling distended.  He reports a history of small bowel resection.  He denies any fevers.  Denies any vomiting.  He has not taken anything for symptoms.  The history is provided by the patient. No language interpreter was used.    Past Medical History:  Diagnosis Date  . Adrenal insufficiency ([place])   . Anemia   . Avascular necrosis ([place])   . Crohn's disease ([place])   . Glaucoma   . Inguinal hernia recurrent bilateral   . Low testosterone   . Osteopenia   . Retinal ischemia     Patient Active Problem List   Diagnosis Date Noted  . Bilateral hearing loss 05/31/2017  . B12 deficiency 02/06/2017  . Osteopenia 02/06/2017  . Glaucoma 12/12/2015  . Crohn's disease ([place]) 12/12/2015  . Anemia 12/12/2015  . 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 ABDOMINAL SURGERY    . COLONOSCOPY N/A 12/13/2015   Procedure: COLONOSCOPY;  Surgeon: Laurence Spates, MD;  [facility];  Service: Endoscopy;  Laterality: N/A;  . INGUINAL HERNIA REPAIR Bilateral 05/21/2014   Procedure: OPEN BILATERAL INGUINAL HERNIA REPAIRS WITH MESH;  Surgeon: Donnie Mesa, MD;  Location: [facility];  Service: General;  Laterality: Bilateral;  . SMALL INTESTINE SURGERY  1968, 2001    related to Chrohn's disease  . TONSILLECTOMY           Home Medications    Prior to Admission medications   Medication Sig Start Date End Date Taking? Authorizing Provider  augmented betamethasone dipropionate (DIPROLENE-AF) 0.05 % cream  01/24/17  Yes [provider]  brimonidine (ALPHAGAN) 0.2 % ophthalmic solution Place 1 drop into both eyes 2 (two) times daily. 11/20/15  Yes [provider]  Calcium Carbonate-Vit D-Min (CALCIUM 1200 PO) Take by mouth daily.   Yes [provider]  Cholecalciferol (VITAMIN D) 2000 units CAPS Take 1 capsule (2,000 Units total) by mouth daily. 08/22/16  Yes Armbruster, Carlota Raspberry, MD  Cyanocobalamin (VITAMIN B-12 IJ) Inject 1 mL as directed every 30 (thirty) days.    Yes [provider]  dorzolamide-timolol (COSOPT) 22.3-6.8 MG/ML ophthalmic solution Place 1 drop into both eyes 2 (two) times daily. 11/05/15  Yes [provider]  fluocinonide cream (LIDEX) 2.37 % Apply 1 application topically 2 (two) times daily as needed (Grover's disease).   Yes [provider]  IRON PO Take 325 mg by mouth 3 (three) times a week.    Yes [provider]  latanoprost (XALATAN) 0.005 % ophthalmic solution Place 1 drop into the left eye at bedtime.    Yes [provider]  Netarsudil Dimesylate (RHOPRESSA) 0.02 % SOLN Apply to eye at bedtime.   Yes [provider]  omeprazole (PRILOSEC) 20 MG capsule Take 20 mg by mouth daily. Before breakfast  Yes [provider]  prednisoLONE acetate (PRED FORTE) 1 % ophthalmic suspension Place 1 drop into the right eye 4 (four) times daily.  11/16/15  Yes [provider]  Probiotic Product (TRUNATURE DIGESTIVE PROBIOTIC PO) Take 1 capsule by mouth daily.   Yes [provider]  dicyclomine (BENTYL) 10 MG capsule Take 1 capsule (10 mg total) by mouth every 8 (eight) hours as needed for spasms. Patient not taking: Reported on 07/05/2017 03/07/17   Armbruster, Carlota Raspberry, MD    Family  History Family History  Problem Relation Age of Onset  . Deep vein thrombosis Mother   . Heart disease Father     Social History Social History   Tobacco Use  . Smoking status: Former Smoker    Last attempt to quit: 04/01/1976    Years since quitting: 41.2  . Smokeless tobacco: Never Used  Substance Use Topics  . Alcohol use: Yes    Comment: 2-3  glass wine  weekly  1-2 cans beer/ week  . Drug use: No     Allergies   Patient has no known allergies.   Review of Systems Review of Systems  All other systems reviewed and are negative.    Physical Exam Updated Vital Signs BP 131/88   Pulse 77   Temp (!) 97.5 F (36.4 C) (Oral)   Resp 17   Ht 5' 11"  (1.803 m)   Wt 83 kg (183 lb)   SpO2 96%   BMI 25.52 kg/m   Physical Exam  Constitutional: He is oriented to person, place, and time. He appears well-developed and well-nourished.  HENT:  Head: Normocephalic and atraumatic.  Eyes: Pupils are equal, round, and reactive to light. Conjunctivae and EOM are normal. Right eye exhibits no discharge. Left eye exhibits no discharge. No scleral icterus.  Neck: Normal range of motion. Neck supple. No JVD present.  Cardiovascular: Normal rate, regular rhythm and normal heart sounds. Exam reveals no gallop and no friction rub.  No murmur heard. Pulmonary/Chest: Effort normal and breath sounds normal. No respiratory distress. He has no wheezes. He has no rales. He exhibits no tenderness.  Abdominal: Soft. He exhibits no distension and no mass. There is no tenderness. There is no rebound and no guarding.  Generalized abdominal discomfort  Musculoskeletal: Normal range of motion. He exhibits no edema or tenderness.  Neurological: He is alert and oriented to person, place, and time.  Skin: Skin is warm and dry.  Psychiatric: He has a normal mood and affect. His behavior is normal. Judgment and thought content normal.  Nursing note and vitals reviewed.    ED Treatments / Results   Labs (all labs ordered are listed, but only abnormal results are displayed) Labs Reviewed  CBC WITH DIFFERENTIAL/PLATELET - Abnormal; Notable for the following components:      Result Value   RBC 4.09 (*)    HCT 37.8 (*)    All other components within normal limits  BASIC METABOLIC PANEL - Abnormal; Notable for the following components:   Glucose, Bld 123 (*)    Calcium 8.7 (*)    GFR calc non Af Amer 56 (*)    All other components within normal limits  I-STAT CG4 LACTIC ACID, ED  I-STAT CG4 LACTIC ACID, ED    EKG None  Radiology Ct Abdomen Pelvis W Contrast  Result Date: 07/05/2017 CLINICAL DATA:  84 y/o M; abdominal pain and distention, nausea, vomiting, concern for small bowel obstruction. History of Crohn's disease. EXAM: CT  ABDOMEN AND PELVIS WITH CONTRAST TECHNIQUE: Multidetector CT imaging of the abdomen and pelvis was performed using the standard protocol following bolus administration of intravenous contrast. CONTRAST:  130m ISOVUE-300 IOPAMIDOL (ISOVUE-300) INJECTION 61% COMPARISON:  05/17/2016 CT abdomen and pelvis. FINDINGS: Lower chest: No acute abnormality. Hepatobiliary: No focal liver abnormality is seen. No gallstones, gallbladder wall thickening, or biliary dilatation. Pancreas: Unremarkable. No pancreatic ductal dilatation or surrounding inflammatory changes. Spleen: Normal in size without focal abnormality. Adrenals/Urinary Tract: Adrenal glands are unremarkable. Multiple bilateral renal cysts are stable. Kidneys are otherwise normal, without renal calculi, focal lesion, or hydronephrosis. Bladder is unremarkable. Stomach/Bowel: Mucosal enhancement of the terminal ileum with stricture. Upstream small bowel obstruction. Postsurgical changes of the cecum. No wall thickening or inflammatory change of the colon identified. Stomach is unremarkable. Vascular/Lymphatic: Aortic atherosclerosis. No enlarged abdominal or pelvic lymph nodes. Reproductive: Prostate is unremarkable.  Other: No abdominal wall hernia or abnormality. No abdominopelvic ascites. 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 Stable multilevel degenerative changes of the spine greatest at the L5-S1 level. Stable T12 mild compression deformity. IMPRESSION: 1. Terminal ileum long segment of stricture with active inflammation compatible with history of Crohn's disease. 2. Small bowel obstruction upstream to the strictured terminal ileum. Electronically Signed   By: LKristine GarbeM.D.   On: 07/05/2017 03:14    Procedures Procedures (including critical care time)  Medications Ordered in ED Medications  iopamidol (ISOVUE-300) 61 % injection (has no administration in time range)  HYDROmorphone (DILAUDID) injection 1 mg (1 mg Intravenous Given 07/05/17 0118)  ondansetron (ZOFRAN) injection 4 mg (4 mg Intravenous Given 07/05/17 0116)  sodium chloride 0.9 % bolus 1,000 mL (0 mLs Intravenous Stopped 07/05/17 0314)  iopamidol (ISOVUE-300) 61 % injection 100 mL (100 mLs Intravenous Contrast Given 07/05/17 0230)     Initial Impression / Assessment and Plan / ED Course  I have reviewed the triage vital signs and the nursing notes.  Pertinent labs & imaging results that were available during my care of the patient were reviewed by me and considered in my medical decision making (see chart for details).     Patient sent to emergency department by gastroenterologist for evaluation of abdominal pain.  He has a history of Crohn's disease as well as small bowel resection.  He has diminished bowel sounds on exam, and does have tenderness.  Will check CT.  CT is consistent with small bowel obstruction upstream of the terminal ileum.  Patient's pain is well controlled at this time.  Appreciate Dr. GFabio Neighborsfor admitting the patient.  Final Clinical Impressions(s) / ED Diagnoses   Final diagnoses:  SBO (small bowel obstruction) ([facility]    ED Discharge Orders    None        BMontine Circle PA-C 07/05/17 0Vining April, MD 07/05/17 [phone]

## 2017-07-06 ENCOUNTER — Inpatient Hospital Stay (HOSPITAL_COMMUNITY): Payer: PPO

## 2017-07-06 ENCOUNTER — Telehealth: Payer: Self-pay

## 2017-07-06 MED ORDER — ENSURE ENLIVE PO LIQD
237.0000 mL | Freq: Two times a day (BID) | ORAL | Status: DC
  Administered 2017-07-06: 237 mL via ORAL

## 2017-07-06 MED ORDER — PREDNISONE 20 MG PO TABS: 40.0000 mg | ORAL_TABLET | Freq: Every day | ORAL | Status: DC

## 2017-07-06 MED ORDER — PREDNISONE 10 MG PO TABS: ORAL_TABLET | ORAL | 0 refills | Status: DC

## 2017-07-06 NOTE — Discharge Instructions (Addendum)
Small Bowel Obstruction A small bowel obstruction means that something is blocking the small bowel. The small bowel is also called the small intestine. It is the long tube that connects the stomach to the colon. An obstruction will stop food and fluids from passing through the small bowel. Treatment depends on what is causing the problem and how bad the problem is. Follow these instructions at home:  Get a lot of rest.  Follow your diet as told by your doctor. You may need to: ? Only drink clear liquids until you start to get better. ? Avoid solid foods as told by your doctor.  Take over-the-counter and prescription medicines only as told by your doctor.  Keep all follow-up visits as told by your doctor. This is important. Contact a doctor if:  You have a fever.  You have chills. Get help right away if:  You have pain or cramps that get worse.  You throw up (vomit) blood.  You have a feeling of being sick to your stomach (nausea) that does not go away.  You cannot stop throwing up.  You cannot drink fluids.  You feel confused.  You feel dry or thirsty (dehydrated).  Your belly gets more bloated.  You feel weak or you pass out (faint). This information is not intended to replace advice given to you by your health care provider. Make sure you discuss any questions you have with your health care provider. Document Released: 02/04/2004 Document Revised: 08/24/2015 Document Reviewed: 02/20/2014 Elsevier Interactive Patient Education  2018 [employer].    Low-Fiber Diet Fiber is found in fruits, vegetables, and whole grains. A low-fiber diet restricts fibrous foods that are not digested in the small intestine. A diet containing about 10-15 grams of fiber per day is considered low fiber. Low-fiber diets may be used to:  Promote healing and rest the bowel during intestinal flare-ups.  Prevent blockage of a partially obstructed or narrowed gastrointestinal tract.  Reduce  fecal weight and volume.  Slow the movement of feces.  You may be on a low-fiber diet as a transitional diet following surgery, after an injury (trauma), or because of a short (acute) or lifelong (chronic) illness. Your health care provider will determine the length of time you need to stay on this diet. What do I need to know about a low-fiber diet? Always check the fiber content on the packaging's Nutrition Facts label, especially on foods from the grains list. Ask your dietitian if you have questions about specific foods that are related to your condition, especially if the food is not listed below. In general, a low-fiber food will have less than 2 g of fiber. What foods can I eat? Grains All breads and crackers made with white flour. Sweet rolls, doughnuts, waffles, pancakes, [place] toast, bagels. Pretzels, Melba toast, zwieback. Well-cooked cereals, such as cornmeal, farina, or cream cereals. Dry cereals that do not contain whole grains, fruit, or nuts, such as refined corn, wheat, rice, and oat cereals. Potatoes prepared any way without skins, plain pastas and noodles, refined white rice. Use white flour for baking and making sauces. Use allowed list of grains for casseroles, dumplings, and puddings. Vegetables Strained tomato and vegetable juices. Fresh lettuce, cucumber, spinach. Well-cooked (no skin or pulp) or canned vegetables, such as asparagus, bean sprouts, beets, carrots, green beans, mushrooms, potatoes, pumpkin, spinach, yellow squash, tomato sauce/puree, turnips, yams, and zucchini. Keep servings limited to  cup. Fruits All fruit juices except prune juice. Cooked or canned fruits without  skin and seeds, such as applesauce, apricots, cherries, fruit cocktail, grapefruit, grapes, mandarin oranges, melons, peaches, pears, pineapple, and plums. Fresh fruits without skin, such as apricots, avocados, bananas, melons, pineapple, nectarines, and peaches. Keep servings limited to  cup or 1  piece. Meat and Other Protein Sources Ground or well-cooked tender beef, ham, veal, lamb, pork, or poultry. Eggs, plain cheese. Fish, oysters, shrimp, lobster, and other seafood. Liver, organ meats. Smooth nut butters. Dairy All milk products and alternative dairy substitutes, such as soy, rice, almond, and coconut, not containing added whole nuts, seeds, or added fruit. Beverages Decaf coffee, fruit, and vegetable juices or smoothies (small amounts, with no pulp or skins, and with fruits from allowed list), sports drinks, herbal tea. Condiments Ketchup, mustard, vinegar, cream sauce, cheese sauce, cocoa powder. Spices in moderation, such as allspice, basil, bay leaves, celery powder or leaves, cinnamon, cumin powder, curry powder, ginger, mace, marjoram, onion or garlic powder, oregano, paprika, parsley flakes, ground pepper, rosemary, sage, savory, tarragon, thyme, and turmeric. Sweets and Desserts Plain cakes and cookies, pie made with allowed fruit, pudding, custard, cream pie. Gelatin, fruit, ice, sherbet, frozen ice pops. Ice cream, ice milk without nuts. Plain hard candy, honey, jelly, molasses, syrup, sugar, chocolate syrup, gumdrops, marshmallows. Limit overall sugar intake. Fats and Oil Margarine, butter, cream, mayonnaise, salad oils, plain salad dressings made from allowed foods. Choose healthy fats such as olive oil, canola oil, and omega-3 fatty acids (such as found in salmon or tuna) when possible. Other Bouillon, broth, or cream soups made from allowed foods. Any strained soup. Casseroles or mixed dishes made with allowed foods. The items listed above may not be a complete list of recommended foods or beverages. Contact your dietitian for more options. What foods are not recommended? Grains All whole wheat and whole grain breads and crackers. Multigrains, rye, bran seeds, nuts, or coconut. Cereals containing whole grains, multigrains, bran, coconut, nuts, raisins. Cooked or dry  oatmeal, steel-cut oats. Coarse wheat cereals, granola. Cereals advertised as high fiber. Potato skins. Whole grain pasta, wild or brown rice. Popcorn. Coconut flour. Bran, buckwheat, corn bread, multigrains, rye, wheat germ. Vegetables Fresh, cooked or canned vegetables, such as artichokes, asparagus, beet greens, broccoli, Brussels sprouts, cabbage, celery, cauliflower, corn, eggplant, kale, legumes or beans, okra, peas, and tomatoes. Avoid large servings of any vegetables, especially raw vegetables. Fruits Fresh fruits, such as apples with or without skin, berries, cherries, figs, grapes, grapefruit, guavas, kiwis, mangoes, oranges, papayas, pears, persimmons, pineapple, and pomegranate. Prune juice and juices with pulp, stewed or dried prunes. Dried fruits, dates, raisins. Fruit seeds or skins. Avoid large servings of all fresh fruits. Meats and Other Protein Sources Tough, fibrous meats with gristle. Chunky nut butter. Cheese made with seeds, nuts, or other foods not recommended. Nuts, seeds, legumes (beans, including baked beans), dried peas, beans, lentils. Dairy Yogurt or cheese that contains nuts, seeds, or added fruit. Beverages Fruit juices with high pulp, prune juice. Caffeinated coffee and teas. Condiments Coconut, maple syrup, pickles, olives. Sweets and Desserts Desserts, cookies, or candies that contain nuts or coconut, chunky peanut butter, dried fruits. Jams, preserves with seeds, marmalade. Large amounts of sugar and sweets. Any other dessert made with fruits from the not recommended list. Other Soups made from vegetables that are not recommended or that contain other foods not recommended. The items listed above may not be a complete list of foods and beverages to avoid. Contact your dietitian for more information. This information is not intended to replace  advice given to you by your health care provider. Make sure you discuss any questions you have with your health care  provider. Document Released: 06/18/2001 Document Revised: 06/04/2015 Document Reviewed: 11/19/2012 Elsevier Interactive Patient Education  2017 [employer].

## 2017-07-06 NOTE — Discharge Summary (Signed)
Discharge Summary  Michael [name] AQT:[id number] DOB: [birth year]  PCP: Binnie Rail, MD  Admit date: 07/05/2017 Discharge date: 07/06/2017  Time spent: 25 minutes  Recommendations for Outpatient Follow-up:  1. Follow-up with GI 2. Follow-up with PCP 3. Take your medications as prescribed  Recommendations by GI:  Switch to oral Prednisone-continue on taper outpatient (10 mg tabs; 4 tabs once daily for 5 days, then 3 tabs once daily for 5 days, then 2 tabs once daily for 5 days, then 1 tab once daily for 5 days, then a half tab once daily for 5 days) Will arrange for outpatient follow up with me in 2-3 weeks. Patient will be contacted regarding appt.    Discharge Diagnoses:  Active Hospital Problems   Diagnosis Date Noted  . SBO (small bowel obstruction) ([place]) 07/05/2017  . Crohn's disease ([place]) 12/12/2015    Resolved Hospital Problems  No resolved problems to display.    Discharge Condition: Stable  Diet recommendation: Resume previous diet  Vitals:   07/06/17 0624 07/06/17 1440  BP: 138/80 140/72  Pulse: 66 73  Resp: 17 15  Temp: 98.7 F (37.1 C) 98.2 F (36.8 C)  SpO2: 97% 98%    History of present illness:   Michael [name] is a 84 year old male with past medical history significant for Crohn's disease who presented to ED [facility] with complaints of abdominal pain of 1 day duration associated with nausea and vomiting.  CT abdomen and pelvis revealed small bowel obstruction and inflammation at the terminal ileum.  Admitted for SBO.  Patient seen and examined at his bedside.  Reports abdominal pain is improved.  Denies flatus or bowel movements.  GI consulted.  Highly appreciated.  07/06/2017: Patient seen and examined at his bedside.  He reports he had 3 bowel movements last night and is having flatus at the time of this visit.  He has tolerated a diet.  Has normal bowel sounds on physical exam.  Abdominal x-ray reveals resolution of small bowel obstruction.  On the  day of discharge, the patient was hemodynamically stable.  He will need to follow-up with GI and PCP posthospitalization.  Hospital Course:  Principal Problem:   SBO (small bowel obstruction) (HCC) Active Problems:   Crohn's disease ([place])  1. SBO -resolved Abdominal x-ray done on 07/06/2017 revealed resolution of small bowel obstruction Tolerating a diet well 2. Crohn's disease with flare - GI followed with recommendations Follow-up with GI outpatient   Procedures:  None  Consultations:  GI  Discharge Exam: BP 140/72 (BP Location: Left Arm)   Pulse 73   Temp 98.2 F (36.8 C) (Oral)   Resp 15   Ht 5' 11"  (1.803 m)   Wt 83 kg (183 lb)   SpO2 98%   BMI 25.52 kg/m  . General: 84 y.o. year-old male well developed well nourished in no acute distress.  Alert and oriented x3. . Cardiovascular: Regular rate and rhythm with no rubs or gallops.  No thyromegaly or JVD noted.   Marland Kitchen Respiratory: Clear to auscultation with no wheezes or rales. Good inspiratory effort. . Abdomen: Soft nontender nondistended with normal bowel sounds x4 quadrants. . Musculoskeletal: No lower extremity edema. 2/4 pulses in all 4 extremities. . Skin: No ulcerative lesions noted or rashes . Psychiatry: Mood is appropriate for condition and setting  Discharge Instructions You were cared for by a hospitalist during your hospital stay. If you have any questions about your discharge medications or the care you received while you were  in the hospital after you are discharged, you can call the unit and asked to speak with the hospitalist on call if the hospitalist that took care of you is not available. Once you are discharged, your primary care physician will handle any further medical issues. Please note that NO REFILLS for any discharge medications will be authorized once you are discharged, as it is imperative that you return to your primary care physician (or establish a relationship with a primary care physician  if you do not have one) for your aftercare needs so that they can reassess your need for medications and monitor your lab values.   Allergies as of 07/06/2017   No Known Allergies     Medication List    STOP taking these medications   augmented betamethasone dipropionate 0.05 % cream Commonly known as:  DIPROLENE-AF   fluocinonide cream 0.05 % Commonly known as:  LIDEX   prednisoLONE acetate 1 % ophthalmic suspension Commonly known as:  PRED FORTE   RHOPRESSA 0.02 % Soln Generic drug:  Netarsudil Dimesylate   XALATAN 0.005 % ophthalmic solution Generic drug:  latanoprost     TAKE these medications   brimonidine 0.2 % ophthalmic solution Commonly known as:  ALPHAGAN Place 1 drop into both eyes 2 (two) times daily.   CALCIUM 1200 PO Take by mouth daily.   dorzolamide-timolol 22.3-6.8 MG/ML ophthalmic solution Commonly known as:  COSOPT Place 1 drop into both eyes 2 (two) times daily.   IRON PO Take 325 mg by mouth 3 (three) times a week.   omeprazole 20 MG capsule Commonly known as:  PRILOSEC Take 20 mg by mouth daily. Before breakfast   predniSONE 10 MG tablet Commonly known as:  DELTASONE Take 10 mg tabs: 4 tabs once daily x5 days then, 3 tabs once daily for 5 days, then 2 tabs once daily for 5 days, then  1 tab once daily for 5 days, then  1/2 tab once daily for 5 days   TRUNATURE DIGESTIVE PROBIOTIC PO Take 1 capsule by mouth daily.   VITAMIN B-12 IJ Inject 1 mL as directed every 30 (thirty) days.   Vitamin D 2000 units Caps Take 1 capsule (2,000 Units total) by mouth daily.      No Known Allergies Follow-up Information    Binnie Rail, MD. Call in 1 day(s).   Specialty:  Internal Medicine Why:  Please call for an appointment Contact information: [place] [address] [phone]        Gatha Mayer, MD. Call in 1 day(s).   Specialty:  Gastroenterology Why:  Please call for an appointment. Contact information: [address] [place] [address] [phone]            The results of significant diagnostics from this hospitalization (including imaging, microbiology, ancillary and laboratory) are listed below for reference.    Significant Diagnostic Studies: Ct Abdomen Pelvis W Contrast  Result Date: 07/05/2017 CLINICAL DATA:  84 y/o M; abdominal pain and distention, nausea, vomiting, concern for small bowel obstruction. History of Crohn's disease. EXAM: CT ABDOMEN AND PELVIS WITH CONTRAST TECHNIQUE: Multidetector CT imaging of the abdomen and pelvis was performed using the standard protocol following bolus administration of intravenous contrast. CONTRAST:  164m ISOVUE-300 IOPAMIDOL (ISOVUE-300) INJECTION 61% COMPARISON:  05/17/2016 CT abdomen and pelvis. FINDINGS: Lower chest: No acute abnormality. Hepatobiliary: No focal liver abnormality is seen. No gallstones, gallbladder wall thickening, or biliary dilatation. Pancreas: Unremarkable. No pancreatic ductal dilatation  or surrounding inflammatory changes. Spleen: Normal in size without focal abnormality. Adrenals/Urinary Tract: Adrenal glands are unremarkable. Multiple bilateral renal cysts are stable. Kidneys are otherwise normal, without renal calculi, focal lesion, or hydronephrosis. Bladder is unremarkable. Stomach/Bowel: Mucosal enhancement of the terminal ileum with stricture. Upstream small bowel obstruction. Postsurgical changes of the cecum. No wall thickening or inflammatory change of the colon identified. Stomach is unremarkable. Vascular/Lymphatic: Aortic atherosclerosis. No enlarged abdominal or pelvic lymph nodes. Reproductive: Prostate is unremarkable. Other: No abdominal wall hernia or abnormality. No abdominopelvic ascites. 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 Stable multilevel degenerative changes of the spine greatest at the L5-S1 level. Stable T12 mild compression deformity. IMPRESSION: 1.  Terminal ileum long segment of stricture with active inflammation compatible with history of Crohn's disease. 2. Small bowel obstruction upstream to the strictured terminal ileum. Electronically Signed   By: Kristine Garbe M.D.   On: 07/05/2017 03:14   Dg Abd 2 Views  Result Date: 07/06/2017 CLINICAL DATA:  Follow up small bowel obstruction EXAM: ABDOMEN - 2 VIEW COMPARISON:  07/05/2017 FINDINGS: Scattered large and small bowel gas is noted. The degree of small bowel dilatation seen on the previous exam has improved. No free air is noted. No other focal abnormality is seen. Postsurgical changes are noted in the right lower quadrant consistent with the given clinical history. IMPRESSION: Resolution of previously seen small bowel dilatation. Electronically Signed   By: Inez Catalina M.D.   On: 07/06/2017 09:50    Microbiology: No results found for this or any previous visit (from the past 240 hour(s)).   Labs: Basic Metabolic Panel: Recent Labs  Lab 07/05/17 0047  NA 140  K 3.8  CL 107  CO2 26  GLUCOSE 123*  BUN 15  CREATININE 1.17  CALCIUM 8.7*   Liver Function Tests: No results for input(s): AST, ALT, ALKPHOS, BILITOT, PROT, ALBUMIN in the last 168 hours. No results for input(s): LIPASE, AMYLASE in the last 168 hours. No results for input(s): AMMONIA in the last 168 hours. CBC: Recent Labs  Lab 07/05/17 0047  WBC 7.8  NEUTROABS 5.2  HGB 13.0  HCT 37.8*  MCV 92.4  PLT 208   Cardiac Enzymes: No results for input(s): CKTOTAL, CKMB, CKMBINDEX, TROPONINI in the last 168 hours. BNP: BNP (last 3 results) No results for input(s): BNP in the last 8760 hours.  ProBNP (last 3 results) No results for input(s): PROBNP in the last 8760 hours.  CBG: No results for input(s): GLUCAP in the last 168 hours.     Signed:  Kayleen Memos, MD [facility] 07/06/2017, 3:20 PM

## 2017-07-06 NOTE — Progress Notes (Addendum)
Progress Note   Assessment / Plan:    Assessment: 1.  Crohn's disease with small bowel obstruction: Resolved via imaging, patient with 4 bowel movements last night, feeling much better  Plan: 1.  Started patient on full liquid diet this afternoon, can then likely advance as tolerated 2.  Switch to oral Prednisone-continue on taper outpatient (10 mg tabs; 4 tabs once daily for 5 days, then 3 tabs once daily for 5 days, then 2 tabs once daily for 5 days, then 1 tab once daily for 5 days, then a half tab once daily for 5 days) 3.  Will arrange for outpatient follow up with me in 2-3 weeks. Patient will be contacted regarding appt. 4. Please await any further recommendations from Dr. Carlean Purl later today   LOS: 1 day   Lavone Nian [facility]  07/06/2017, 10:46 AM  Pager # [phone]    [place] Attending   I have taken an interval history, reviewed the chart and examined the patient. I agree with the Advanced Practitioner's note, impression and recommendations.   SBO resolved  Home today  Gatha Mayer, MD, [facility] 07/06/2017 1:34 PM    Subjective  Chief Complaint: Crohn's disease with small bowel obstruction  Reports a great improvement today, he feels like a "million".  Describes 3-4 bowel movements last night with the last one being a "really big one".  Reports that his abdomen feels softer and overall he feels greatly improved.  He did have a clear liquid diet for dinner and breakfast this morning and is "ready to go home".   Objective   Vital signs in last 24 hours: Temp:  [98 F (36.7 C)-98.7 F (37.1 C)] 98.7 F (37.1 C) (06/27 0624) Pulse Rate:  [66-71] 66 (06/27 0624) Resp:  [15-17] 17 (06/27 0624) BP: (122-141)/(77-88) 138/80 (06/27 0624) SpO2:  [94 %-97 %] 97 % (06/27 0624) Last BM Date: 07/06/17 General:    white male in NAD Heart:  Regular rate and rhythm; no murmurs Lungs: Respirations even and unlabored, lungs CTA  bilaterally Abdomen:  Soft, nontender and nondistended. Normal bowel sounds. Extremities:  Without edema. Neurologic:  Alert and oriented,  grossly normal neurologically. Psych:  Cooperative. Normal mood and affect.  Intake/Output from previous day: 06/26 0701 - 06/27 0700 In: 605 [P.O.:100; I.V.:505] Out: -  Intake/Output this shift: Total I/O In: 868.3 [P.O.:120; I.V.:748.3] Out: -   Lab Results: Recent Labs    07/05/17 0047  WBC 7.8  HGB 13.0  HCT 37.8*  PLT 208   BMET Recent Labs    07/05/17 0047  NA 140  K 3.8  CL 107  CO2 26  GLUCOSE 123*  BUN 15  CREATININE 1.17  CALCIUM 8.7*   Studies/Results: Ct Abdomen Pelvis W Contrast  Result Date: 07/05/2017 CLINICAL DATA:  84 y/o M; abdominal pain and distention, nausea, vomiting, concern for small bowel obstruction. History of Crohn's disease. EXAM: CT ABDOMEN AND PELVIS WITH CONTRAST TECHNIQUE: Multidetector CT imaging of the abdomen and pelvis was performed using the standard protocol following bolus administration of intravenous contrast. CONTRAST:  127m ISOVUE-300 IOPAMIDOL (ISOVUE-300) INJECTION 61% COMPARISON:  05/17/2016 CT abdomen and pelvis. FINDINGS: Lower chest: No acute abnormality. Hepatobiliary: No focal liver abnormality is seen. No gallstones, gallbladder wall thickening, or biliary dilatation. Pancreas: Unremarkable. No pancreatic ductal dilatation or surrounding inflammatory changes. Spleen: Normal in size without focal abnormality. Adrenals/Urinary Tract: Adrenal glands are unremarkable. Multiple bilateral renal cysts are stable. Kidneys are otherwise normal,  without renal calculi, focal lesion, or hydronephrosis. Bladder is unremarkable. Stomach/Bowel: Mucosal enhancement of the terminal ileum with stricture. Upstream small bowel obstruction. Postsurgical changes of the cecum. No wall thickening or inflammatory change of the colon identified. Stomach is unremarkable. Vascular/Lymphatic: Aortic  atherosclerosis. No enlarged abdominal or pelvic lymph nodes. Reproductive: Prostate is unremarkable. Other: No abdominal wall hernia or abnormality. No abdominopelvic ascites. 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 Stable multilevel degenerative changes of the spine greatest at the L5-S1 level. Stable T12 mild compression deformity. IMPRESSION: 1. Terminal ileum long segment of stricture with active inflammation compatible with history of Crohn's disease. 2. Small bowel obstruction upstream to the strictured terminal ileum. Electronically Signed   By: Kristine Garbe M.D.   On: 07/05/2017 03:14   Dg Abd 2 Views  Result Date: 07/06/2017 CLINICAL DATA:  Follow up small bowel obstruction EXAM: ABDOMEN - 2 VIEW COMPARISON:  07/05/2017 FINDINGS: Scattered large and small bowel gas is noted. The degree of small bowel dilatation seen on the previous exam has improved. No free air is noted. No other focal abnormality is seen. Postsurgical changes are noted in the right lower quadrant consistent with the given clinical history. IMPRESSION: Resolution of previously seen small bowel dilatation. Electronically Signed   By: Inez Catalina M.D.   On: 07/06/2017 09:50

## 2017-07-06 NOTE — Telephone Encounter (Signed)
Thanks for letting me know [name], looks like Dr. Carlean Purl is following. He can follow up with me once outpatient.

## 2017-07-06 NOTE — Progress Notes (Signed)
Discharge instructions reviewed with patient. All questions answered. Patient wheeled to vehicle with belongings by nurse tech.

## 2017-07-06 NOTE — Telephone Encounter (Signed)
-----   Message from Levin Erp, [place] sent at 07/06/2017 11:16 AM EDT ----- Regarding: Please arrange appt with me Please set patient up with me in 2-3 weeks in clinic. Please also call and confirm with him-he is currently in hospital.  Thanks-JLL

## 2017-07-06 NOTE — Telephone Encounter (Signed)
Appt made with Michael [name] for 07/20/17 at 145 pm.  He will be notified at discharge.

## 2017-07-07 ENCOUNTER — Telehealth: Payer: Self-pay | Admitting: Gastroenterology

## 2017-07-07 ENCOUNTER — Telehealth: Payer: Self-pay | Admitting: *Deleted

## 2017-07-07 ENCOUNTER — Telehealth: Payer: Self-pay | Admitting: Physician Assistant

## 2017-07-07 ENCOUNTER — Other Ambulatory Visit: Payer: Self-pay

## 2017-07-07 NOTE — Telephone Encounter (Signed)
Patient wife states pt was just seen in hosp and was told to stop taking eye drops. Pt tried to resch eye doc who is on vacation this week. Pt has questions and wanted to know if someone from this office could give advice. Pt wife states pt was seen in hosp by Dr.Gessner and APP Anderson Malta but was last seen in office by Dr.Armbruster.

## 2017-07-07 NOTE — Telephone Encounter (Signed)
Pt was on TYCM report admitted SBO (small bowel obstruction). Crohn's disease with flare -GI followed with recommendations. Pt has made hosp f/u w/GI for 07/20/17...Johny Chess

## 2017-07-07 NOTE — Telephone Encounter (Signed)
Michael [name], patient just saw on the D/C instructions today that he needs to stop the prednisolone acetate 1%, Rhopressa 0.02 % and Xalatan 0.005% ophthalmic solutions. Patient has been taking these since having eye surgery and is concerned about stopping these. Patient tried to reach the prescribing eye doctor but he is out of the office until Monday, staff not sure if they can reach him to ask about stopping these. Please advise.

## 2017-07-07 NOTE — Telephone Encounter (Signed)
Spoke with patient and wife.  He is doing well, experiencing increas in BM-loose mulitple times a day. Explained that he can take his Imodium again if he needs, even twice a day if necessary.   Discussed eye meds- he can resume all prior medications- these were given after recent eye surgery.  Told him to call with any further questions.  Ellouise Newer, PA-C 07/07/17 1435

## 2017-07-20 ENCOUNTER — Encounter: Payer: Self-pay | Admitting: Physician Assistant

## 2017-07-20 ENCOUNTER — Ambulatory Visit (INDEPENDENT_AMBULATORY_CARE_PROVIDER_SITE_OTHER): Payer: PPO | Admitting: Physician Assistant

## 2017-07-20 ENCOUNTER — Other Ambulatory Visit (INDEPENDENT_AMBULATORY_CARE_PROVIDER_SITE_OTHER): Payer: PPO

## 2017-07-20 VITALS — BP 120/78 | HR 78 | Ht 71.0 in | Wt 181.0 lb

## 2017-07-20 DIAGNOSIS — Z961 Presence of intraocular lens: Secondary | ICD-10-CM | POA: Diagnosis not present

## 2017-07-20 DIAGNOSIS — K5010 Crohn's disease of large intestine without complications: Secondary | ICD-10-CM

## 2017-07-20 DIAGNOSIS — H401133 Primary open-angle glaucoma, bilateral, severe stage: Secondary | ICD-10-CM | POA: Diagnosis not present

## 2017-07-20 LAB — CBC WITH DIFFERENTIAL/PLATELET
BASOS PCT: 0.2 % (ref 0.0–3.0)
Basophils Absolute: 0 10*3/uL (ref 0.0–0.1)
EOS PCT: 0.1 % (ref 0.0–5.0)
Eosinophils Absolute: 0 10*3/uL (ref 0.0–0.7)
HEMATOCRIT: 38.5 % — AB (ref 39.0–52.0)
HEMOGLOBIN: 13.1 g/dL (ref 13.0–17.0)
Lymphocytes Relative: 9.2 % — ABNORMAL LOW (ref 12.0–46.0)
Lymphs Abs: 1.3 10*3/uL (ref 0.7–4.0)
MCHC: 33.9 g/dL (ref 30.0–36.0)
MCV: 94.1 fl (ref 78.0–100.0)
MONO ABS: 0.4 10*3/uL (ref 0.1–1.0)
MONOS PCT: 2.9 % — AB (ref 3.0–12.0)
Neutro Abs: 12.5 10*3/uL — ABNORMAL HIGH (ref 1.4–7.7)
Neutrophils Relative %: 87.6 % — ABNORMAL HIGH (ref 43.0–77.0)
Platelets: 201 10*3/uL (ref 150.0–400.0)
RBC: 4.09 Mil/uL — AB (ref 4.22–5.81)
RDW: 14.1 % (ref 11.5–15.5)
WBC: 14.3 10*3/uL — AB (ref 4.0–10.5)

## 2017-07-20 LAB — COMPREHENSIVE METABOLIC PANEL
ALBUMIN: 4 g/dL (ref 3.5–5.2)
ALT: 29 U/L (ref 0–53)
AST: 16 U/L (ref 0–37)
Alkaline Phosphatase: 56 U/L (ref 39–117)
BUN: 17 mg/dL (ref 6–23)
CHLORIDE: 103 meq/L (ref 96–112)
CO2: 27 mEq/L (ref 19–32)
Calcium: 8.9 mg/dL (ref 8.4–10.5)
Creatinine, Ser: 0.97 mg/dL (ref 0.40–1.50)
GFR: 78.46 mL/min (ref >60.00)
Glucose, Bld: 113 mg/dL — ABNORMAL HIGH (ref 70–99)
Potassium: 3.7 mEq/L (ref 3.5–5.1)
SODIUM: 137 meq/L (ref 135–145)
Total Bilirubin: 1.1 mg/dL (ref 0.2–1.2)
Total Protein: 7 g/dL (ref 6.0–8.3)

## 2017-07-20 NOTE — Progress Notes (Signed)
Agree with assessment with following recommendations. Patient had an SBO and resolved quickly with steroids. He has declined other medical therapy / biologics in the past. He does have a history of complications from steroids remotely but has not needed them in recently until his admission. I would taper the prednisone slowly, by 37m / week until done. Prednisone will interfere with testing for adrenal insufficiency so I don't think it will be helpful now. If he notes any symptoms when he tapers down to the lower doses at 556mhe should let usKoreanow. If they have questions about that moving forward they can also discuss with his endocrinologist whom he has seen in the past.

## 2017-07-20 NOTE — Patient Instructions (Addendum)
If you are age 82 or older, your body mass index should be between 23-30. Your Body mass index is 25.24 kg/m. If this is out of the aforementioned range listed, please consider follow up with your Primary Care Provider.  If you are age 66 or younger, your body mass index should be between 19-25. Your Body mass index is 25.24 kg/m. If this is out of the aformentioned range listed, please consider follow up with your Primary Care Provider.   Your provider has requested that you go to the basement level for lab work before leaving today. Press "B" on the elevator. The lab is located at the first door on the left as you exit the elevator.  Go the lab in one month on  08/21/17.  You have been given a coupon for VSL #3.  Please take this to [facility] on [address].. Take two capsules twice daily.  Follow up with Dr. Havery Moros as needed.  Thank you for choosing me and [facility].    Ellouise Newer, PA-C

## 2017-07-20 NOTE — Progress Notes (Signed)
Chief Complaint: Follow-up after hospitalization for Crohn's flare  HPI:    Michael [name] is an 84 year old male, known to Michael [name] for his history of Crohn's disease, who presents to clinic today after recent hospitalization for Crohn's flare.    07/05/2017-07/06/2017 hospitalization for Crohn's flare with a CT scan confirming small bowel obstruction.  Patient improved on Solu-Medrol 40 mg twice daily initially.  At time of discharge she was told to taper his prednisone starting at 40 mg daily x5 days then 30 mg daily for 5 days then 20 mg daily for 5 days and then 10 mg daily for 5 days and then 5 mg for 5 days.    07/07/2017 spoke with patient via phone described an increase in bowel movements loose and multiple times a day.  Recommend he start his Imodium again.    Today, reports that he is doing well.  Explains that he is having 2-3 typically loose but sometimes formed bowel movements a day, this is "normal for me".  He has started back taking his Imodium daily and believes this helps.  His wife asks various questions including rechecking his adrenal glands as he suffered from adrenal insufficiency after prednisone dosage last and also if he should be on a probiotic.    Also briefly discusses some urinary frequency which he is experiencing.  He has plans to follow with urology regarding this.    Denies fever, chills, blood in his stool, melena, weight loss, further abdominal distention or abdominal pain.  GI history: 03/07/2017 office visit Dr. Nicola Police patient had 2 operations for his Crohn's in 1968 and then again 2001 for small bowel resections, had been treated with prednisone as needed in the past, had never been on any other therapy for Crohn's disease, hospitalizations in the past for bowel obstructions, last a few years ago-complications from steroid use followed by endocrinology for adrenal insufficiency as well as hepatic steatosis and vascular necrosis of the hips on CT  scan; at that time discussed using budesonide as needed for flares in the future 12/2015 colonoscopy- normal colon, patent ileocolonic anastomosis, active Crohn's ileitis  Past Medical History:  Diagnosis Date  . Adrenal insufficiency ([place])   . Anemia   . Avascular necrosis ([place])   . Crohn's disease ([place])   . Glaucoma   . Inguinal hernia recurrent bilateral   . Low testosterone   . Osteopenia   . Retinal ischemia     Past Surgical History:  Procedure Laterality Date  . ABDOMINAL SURGERY    . COLONOSCOPY N/A 12/13/2015   Procedure: COLONOSCOPY;  Surgeon: Laurence Spates, MD;  Location: [facility];  Service: Endoscopy;  Laterality: N/A;  . INGUINAL HERNIA REPAIR Bilateral 05/21/2014   Procedure: OPEN BILATERAL INGUINAL HERNIA REPAIRS WITH MESH;  Surgeon: Donnie Mesa, MD;  Location: [place];  Service: General;  Laterality: Bilateral;  . SMALL INTESTINE SURGERY  1968, 2001    related to Chrohn's disease  . TONSILLECTOMY      Current Outpatient Medications  Medication Sig Dispense Refill  . brimonidine (ALPHAGAN) 0.2 % ophthalmic solution Place 1 drop into both eyes 2 (two) times daily.    . Calcium Carbonate-Vit D-Min (CALCIUM 1200 PO) Take by mouth daily.    . Cholecalciferol (VITAMIN D) 2000 units CAPS Take 1 capsule (2,000 Units total) by mouth daily. 30 capsule   . Cyanocobalamin (VITAMIN B-12 IJ) Inject 1 mL as directed every 30 (thirty) days.     . dorzolamide-timolol (COSOPT) 22.3-6.8 MG/ML  ophthalmic solution Place 1 drop into both eyes 2 (two) times daily.    . IRON PO Take 325 mg by mouth 3 (three) times a week.     Marland Kitchen omeprazole (PRILOSEC) 20 MG capsule Take 20 mg by mouth daily. Before breakfast    . predniSONE (DELTASONE) 10 MG tablet Take 10 mg tabs: 4 tabs once daily x5 days then, 3 tabs once daily for 5 days, then 2 tabs once daily for 5 days, then  1 tab once daily for 5 days, then  1/2 tab once daily for 5 days 100 tablet 0  . Probiotic  Product (TRUNATURE DIGESTIVE PROBIOTIC PO) Take 1 capsule by mouth daily.     No current facility-administered medications for this visit.     Allergies as of 07/20/2017  . (No Known Allergies)    Family History  Problem Relation Age of Onset  . Deep vein thrombosis Mother   . Heart disease Father     Social History   Socioeconomic History  . Marital status: Married    Spouse name: Not on file  . Number of children: 3  . Years of education: Not on file  . Highest education level: Not on file  Occupational History  . Occupation: Retired  Scientific laboratory technician  . Financial resource strain: Not on file  . Food insecurity:    Worry: Not on file    Inability: Not on file  . Transportation needs:    Medical: Not on file    Non-medical: Not on file  Tobacco Use  . Smoking status: Former Smoker    Last attempt to quit: 04/01/1976    Years since quitting: 41.3  . Smokeless tobacco: Never Used  Substance and Sexual Activity  . Alcohol use: Yes    Comment: 2-3  glass wine  weekly  1-2 cans beer/ week  . Drug use: No  . Sexual activity: Never  Lifestyle  . Physical activity:    Days per week: Not on file    Minutes per session: Not on file  . Stress: Not on file  Relationships  . Social connections:    Talks on phone: Not on file    Gets together: Not on file    Attends religious service: Not on file    Active member of club or organization: Not on file    Attends meetings of clubs or organizations: Not on file    Relationship status: Not on file  . Intimate partner violence:    Fear of current or ex partner: Not on file    Emotionally abused: Not on file    Physically abused: Not on file    Forced sexual activity: Not on file  Other Topics Concern  . Not on file  Social History Narrative  . Not on file    Review of Systems:    Constitutional: No weight loss, fever or chills Cardiovascular: No chest pain   Respiratory: No SOB Gastrointestinal: See HPI and otherwise  negative  Physical Exam:  Vital signs: BP 120/78   Pulse 78   Ht 5' 11"  (1.803 m)   Wt 181 lb (82.1 kg)   BMI 25.24 kg/m   Constitutional:  Very Pleasant Caucasian male appears to be in NAD, Well developed, Well nourished, alert and cooperative Respiratory: Respirations even and unlabored. Lungs clear to auscultation bilaterally.   No wheezes, crackles, or rhonchi.  Cardiovascular: Normal S1, S2. No MRG. Regular rate and rhythm. No peripheral edema, cyanosis or pallor.  Gastrointestinal:  Soft, nondistended, nontender. No rebound or guarding. Normal bowel sounds. No appreciable masses or hepatomegaly. Psychiatric: Demonstrates good judgement and reason without abnormal affect or behaviors.  MOST RECENT LABS AND IMAGING: CBC    Component Value Date/Time   WBC 7.8 07/05/2017 0047   RBC 4.09 (L) 07/05/2017 0047   HGB 13.0 07/05/2017 0047   HCT 37.8 (L) 07/05/2017 0047   PLT 208 07/05/2017 0047   MCV 92.4 07/05/2017 0047   MCH 31.8 07/05/2017 0047   MCHC 34.4 07/05/2017 0047   RDW 13.3 07/05/2017 0047   LYMPHSABS 1.6 07/05/2017 0047   MONOABS 0.8 07/05/2017 0047   EOSABS 0.2 07/05/2017 0047   BASOSABS 0.0 07/05/2017 0047    CMP     Component Value Date/Time   NA 140 07/05/2017 0047   NA 138 10/04/2016   K 3.8 07/05/2017 0047   CL 107 07/05/2017 0047   CO2 26 07/05/2017 0047   GLUCOSE 123 (H) 07/05/2017 0047   BUN 15 07/05/2017 0047   BUN 13 10/04/2016   CREATININE 1.17 07/05/2017 0047   CALCIUM 8.7 (L) 07/05/2017 0047   PROT 7.0 03/02/2017 1500   ALBUMIN 3.8 03/02/2017 1500   AST 10 03/02/2017 1500   ALT 8 03/02/2017 1500   ALKPHOS 62 03/02/2017 1500   BILITOT 0.9 03/02/2017 1500   GFRNONAA 56 (L) 07/05/2017 0047   GFRAA >60 07/05/2017 0047    Assessment: 1.  Crohn's with flare: Patient symptoms have mostly resolved, now with 2-3 loose stools per day which is "normal" for the patient, sometimes this is a blowout, is currently using Imodium which  helps  Plan: 1.  Continue prednisone taper, currently on 39m qd.  2.  Will discuss testing for cortisol/ACTH with Dr. AHavery [name]  Patient's wife asked about testing for this since last time he was on Prednisone it affected his adrenal glands. 3.  Patient to continue Imodium daily. 4.  Discussed VSL #3.  Recommend the patient take 2 tabs twice daily for a month, can decrease in there. 5.  Patient to follow in clinic as needed/directed after testing above.  JEllouise Newer PA-C [facility] 07/20/2017, 1:59 PM  Cc: BBinnie Rail MD

## 2017-07-21 ENCOUNTER — Other Ambulatory Visit: Payer: Self-pay

## 2017-07-21 ENCOUNTER — Telehealth: Payer: Self-pay

## 2017-07-21 MED ORDER — PREDNISONE 10 MG PO TABS: ORAL_TABLET | ORAL | 0 refills | Status: DC

## 2017-07-21 NOTE — Telephone Encounter (Signed)
Author: Levin Erp, PA Service: Gastroenterology Author Type: Physician Assistant  Filed: 07/21/2017 9:50 AM Encounter Date: 07/20/2017 Status: Signed  Editor: Levin Erp, PA (Physician Assistant)       Show:Clear all [x] Manual[] Template[] Copied  Added by: [x] Levin Erp, PA   [] Hover for details   Please call and let patient know I discussed adrenal testing with Dr. Havery Moros. He would recommend against testing for this unless he is experiencing any symptoms in the future, and if he does, he would recommend he follow with his endocrinologist. I have cancelled the labs we previously discussed at time of his office visit. Thanks-JLL

## 2017-07-21 NOTE — Telephone Encounter (Signed)
Patient states that he is returning your phone call. Best # [phone]

## 2017-07-21 NOTE — Progress Notes (Signed)
Please call and let patient know I discussed adrenal testing with Dr. Havery Moros. He would recommend against testing for this unless he is experiencing any symptoms in the future, and if he does, he would recommend he follow with his endocrinologist. I have cancelled the labs we previously discussed at time of his office visit. Thanks-JLL

## 2017-07-21 NOTE — Telephone Encounter (Signed)
I have spoken to the pt and he is aware that the message is prior to our conversation.

## 2017-07-21 NOTE — Telephone Encounter (Signed)
The pt has been advised of the information and verbalized understanding.    He will follow up with endocrinologist if needed in the future.    He will taper by 5 mg every 5 days.  They will contact the pharmacy if refills are needed.

## 2017-07-21 NOTE — Telephone Encounter (Signed)
Ellouise Newer [place], PA  Holt, Koda Defrank L, RN        Also-can we send in some prednisone 5 mg tabs for the patient-dr armbruster wants him to taper by 5 mg q5 days, not ten- so he will probably need some more. I think he is on 38m qd at the moment. Thanks-JLL

## 2017-07-28 DIAGNOSIS — N401 Enlarged prostate with lower urinary tract symptoms: Secondary | ICD-10-CM | POA: Diagnosis not present

## 2017-07-28 DIAGNOSIS — R351 Nocturia: Secondary | ICD-10-CM | POA: Diagnosis not present

## 2017-07-28 DIAGNOSIS — R3915 Urgency of urination: Secondary | ICD-10-CM | POA: Diagnosis not present

## 2017-08-07 ENCOUNTER — Other Ambulatory Visit: Payer: Self-pay

## 2017-08-07 ENCOUNTER — Emergency Department (HOSPITAL_BASED_OUTPATIENT_CLINIC_OR_DEPARTMENT_OTHER): Payer: PPO

## 2017-08-07 ENCOUNTER — Encounter (HOSPITAL_BASED_OUTPATIENT_CLINIC_OR_DEPARTMENT_OTHER): Payer: Self-pay | Admitting: *Deleted

## 2017-08-07 ENCOUNTER — Emergency Department (HOSPITAL_BASED_OUTPATIENT_CLINIC_OR_DEPARTMENT_OTHER)
Admission: EM | Admit: 2017-08-07 | Discharge: 2017-08-07 | Disposition: A | Discharge status: Home / Self Care | Payer: PPO | Attending: Emergency Medicine | Admitting: Emergency Medicine

## 2017-08-07 ENCOUNTER — Ambulatory Visit: Payer: Self-pay | Admitting: *Deleted

## 2017-08-07 DIAGNOSIS — Z79899 Other long term (current) drug therapy: Secondary | ICD-10-CM | POA: Diagnosis not present

## 2017-08-07 DIAGNOSIS — I82412 Acute embolism and thrombosis of left femoral vein: Secondary | ICD-10-CM

## 2017-08-07 DIAGNOSIS — Z87891 Personal history of nicotine dependence: Secondary | ICD-10-CM | POA: Diagnosis not present

## 2017-08-07 DIAGNOSIS — K5090 Crohn's disease, unspecified, without complications: Secondary | ICD-10-CM | POA: Insufficient documentation

## 2017-08-07 DIAGNOSIS — R2242 Localized swelling, mass and lump, left lower limb: Secondary | ICD-10-CM | POA: Diagnosis present

## 2017-08-07 MED ORDER — ELIQUIS 5 MG VTE STARTER PACK: ORAL_TABLET | ORAL | 0 refills | Status: DC

## 2017-08-07 MED ORDER — APIXABAN 2.5 MG PO TABS
10.0000 mg | ORAL_TABLET | Freq: Once | ORAL | Status: AC
  Administered 2017-08-07: 10 mg via ORAL
  Filled 2017-08-07: qty 4

## 2017-08-07 NOTE — ED Notes (Signed)
Back to room  from U/S

## 2017-08-07 NOTE — Telephone Encounter (Signed)
Patient's wife is calling to report her husband's left leg is swollen, painful and changing colors.He played tennis yesterday and this morning he can is having pain and significant swelling with color changes in the leg and foot. Concern is that the foot is blue and black in color and it was not like that yesterday.  Reason for Disposition . [1] Thigh or calf pain AND [2] only 1 side AND [3] present > 1 hour  Answer Assessment - Initial Assessment Questions 1. ONSET: "When did the swelling start?" (e.g., minutes, hours, days)     Wednesday last week 2. LOCATION: "What part of the leg is swollen?"  "Are both legs swollen or just one leg?"     Below knee- left 3. SEVERITY: "How bad is the swelling?" (e.g., localized; mild, moderate, severe)  - Localized - small area of swelling localized to one leg  - MILD pedal edema - swelling limited to foot and ankle, pitting edema < 1/4 inch (6 mm) deep, rest and elevation eliminate most or all swelling  - MODERATE edema - swelling of lower leg to knee, pitting edema > 1/4 inch (6 mm) deep, rest and elevation only partially reduce swelling  - SEVERE edema - swelling extends above knee, facial or hand swelling present      Severe- below knee and down 4. REDNESS: "Does the swelling look red or infected?"     Red upper calf down black and blue to ankle 5. PAIN: "Is the swelling painful to touch?" If so, ask: "How painful is it?"   (Scale 1-10; mild, moderate or severe)     Yes- aching- 7-8 6. FEVER: "Do you have a fever?" If so, ask: "What is it, how was it measured, and when did it start?"      no 7. CAUSE: "What do you think is causing the leg swelling?"     Possible DVT- throbbing 8. MEDICAL HISTORY: "Do you have a history of heart failure, kidney disease, liver failure, or cancer?"     no 9. RECURRENT SYMPTOM: "Have you had leg swelling before?" If so, ask: "When was the last time?" "What happened that time?"     no 10. OTHER SYMPTOMS: "Do you have  any other symptoms?" (e.g., chest pain, difficulty breathing)       no 11. PREGNANCY: "Is there any chance you are pregnant?" "When was your last menstrual period?"       n/a  Protocols used: LEG SWELLING AND EDEMA-A-AH

## 2017-08-07 NOTE — ED Provider Notes (Signed)
[facility] EMERGENCY DEPARTMENT Provider Note   CSN: [id number] Arrival date & time: 08/07/17  1114     History   Chief Complaint Chief Complaint  Patient presents with  . Leg Pain    HPI Michael [name] is a 84 y.o. male.  Pt presents to the ED today with LLE pain and swelling for the past week.  The pt denies f/c.  He denies cp/sob.  The pt has never had a blood clot in the past.  No blood thinners.     Past Medical History:  Diagnosis Date  . Adrenal insufficiency ([place])   . Anemia   . Avascular necrosis ([place])   . Crohn's disease ([place])   . Glaucoma   . Inguinal hernia recurrent bilateral   . Low testosterone   . Osteopenia   . Retinal ischemia     Patient Active Problem List   Diagnosis Date Noted  . SBO (small bowel obstruction) ([place]) 07/05/2017  . Bilateral hearing loss 05/31/2017  . B12 deficiency 02/06/2017  . Osteopenia 02/06/2017  . Glaucoma 12/12/2015  . Crohn's disease ([place]) 12/12/2015  . Anemia 12/12/2015  . 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 ABDOMINAL SURGERY    . COLONOSCOPY N/A 12/13/2015   Procedure: COLONOSCOPY;  Surgeon: Laurence Spates, MD;  Location: [facility];  Service: Endoscopy;  Laterality: N/A;  . INGUINAL HERNIA REPAIR Bilateral 05/21/2014   Procedure: OPEN BILATERAL INGUINAL HERNIA REPAIRS WITH MESH;  Surgeon: Donnie Mesa, MD;  Location: [facility];  Service: General;  Laterality: Bilateral;  . SMALL INTESTINE SURGERY  1968, 2001    related to Chrohn's disease  . TONSILLECTOMY          Home Medications    Prior to Admission medications   Medication Sig Start Date End Date Taking? Authorizing Provider  brimonidine (ALPHAGAN) 0.2 % ophthalmic solution Place 1 drop into both eyes 2 (two) times daily. 11/20/15   [provider]  Calcium Carbonate-Vit D-Min (CALCIUM 1200 PO) Take by mouth daily.    [provider]  Cholecalciferol  (VITAMIN D) 2000 units CAPS Take 1 capsule (2,000 Units total) by mouth daily. 08/22/16   Armbruster, Carlota Raspberry, MD  Cyanocobalamin (VITAMIN B-12 IJ) Inject 1 mL as directed every 30 (thirty) days.     [provider]  dorzolamide-timolol (COSOPT) 22.3-6.8 MG/ML ophthalmic solution Place 1 drop into both eyes 2 (two) times daily. 11/05/15   [provider]  ELIQUIS STARTER PACK (ELIQUIS STARTER PACK) 5 MG TABS Take as directed on package: start with two-37m tablets twice daily for 7 days. On day 8, switch to one-557mtablet twice daily. 08/07/17   HaIsla PenceMD  IRON PO Take 325 mg by mouth 3 (three) times a week.     [provider]  omeprazole (PRILOSEC) 20 MG capsule Take 20 mg by mouth daily. Before breakfast    [provider]  predniSONE (DELTASONE) 10 MG tablet Take 15 mg daily for 5 days Then 10 mg for 5 days Then 5 mg for 5 days 07/21/17   Armbruster, StCarlota RaspberryMD  Probiotic Product (TRUNATURE DIGESTIVE PROBIOTIC PO) Take 1 capsule by mouth daily.    [provider]    Family History Family History  Problem Relation Age of Onset  . Deep vein thrombosis Mother   . Heart disease Father     Social History Social History   Tobacco Use  . Smoking  status: Former Smoker    Last attempt to quit: 04/01/1976    Years since quitting: 41.3  . Smokeless tobacco: Never Used  Substance Use Topics  . Alcohol use: Yes    Comment: 2-3  glass wine  weekly  1-2 cans beer/ week  . Drug use: No     Allergies   Patient has no known allergies.   Review of Systems Review of Systems  Musculoskeletal:       Left leg pain  All other systems reviewed and are negative.    Physical Exam Updated Vital Signs BP (!) 146/93   Pulse 92   Temp 97.6 F (36.4 C) (Oral)   Resp 20   Ht 5' 11"  (1.803 m)   Wt 82.1 kg (181 lb)   SpO2 98%   BMI 25.24 kg/m   Physical Exam  Constitutional: He is oriented to person, place, and time. He appears  well-developed and well-nourished.  HENT:  Head: Normocephalic and atraumatic.  Right Ear: External ear normal.  Left Ear: External ear normal.  Nose: Nose normal.  Mouth/Throat: Oropharynx is clear and moist.  Eyes: Pupils are equal, round, and reactive to light. Conjunctivae and EOM are normal.  Neck: Normal range of motion. Neck supple.  Cardiovascular: Normal rate, regular rhythm, normal heart sounds and intact distal pulses.  Pulmonary/Chest: Effort normal and breath sounds normal.  Abdominal: Soft. Bowel sounds are normal.  Musculoskeletal:  LLE redness and swelling  Neurological: He is alert and oriented to person, place, and time.  Skin: Skin is warm. Capillary refill takes less than 2 seconds.  Psychiatric: He has a normal mood and affect. His behavior is normal. Judgment and thought content normal.  Nursing note and vitals reviewed.    ED Treatments / Results  Labs (all labs ordered are listed, but only abnormal results are displayed) Labs Reviewed - No data to display  EKG None  Radiology US Venous Img Lower Unilateral Left  Result Date: 08/07/2017 CLINICAL DATA:  Left lower extremity pain and swelling. Discoloration for 5 days. EXAM: LEFT LOWER EXTREMITY VENOUS DOPPLER ULTRASOUND TECHNIQUE: Gray-scale sonography with graded compression, as well as color Doppler and duplex ultrasound were performed to evaluate the lower extremity deep venous systems from the level of the common femoral vein and including the common femoral, femoral, profunda femoral, popliteal and calf veins including the posterior tibial, peroneal and gastrocnemius veins when visible. The superficial great saphenous vein was also interrogated. Spectral Doppler was utilized to evaluate flow at rest and with distal augmentation maneuvers in the common femoral, femoral and popliteal veins. COMPARISON:  None. FINDINGS: Contralateral Common Femoral Vein: Respiratory phasicity is normal and symmetric with the  symptomatic side. No evidence of thrombus. Normal compressibility. Common Femoral Vein: No evidence of thrombus. Normal compressibility, respiratory phasicity and response to augmentation. Saphenofemoral Junction: No evidence of thrombus. Normal compressibility and flow on color Doppler imaging. Profunda Femoral Vein: No evidence of thrombus. Normal compressibility and flow on color Doppler imaging. Femoral Vein: Positive for thrombus. The left femoral vein is expanded with heterogeneous thrombus starting in the proximal femoral vein. The entire left femoral vein is thrombosed. No flow within the femoral vein and findings are suggestive for occlusive thrombus. Popliteal Vein: Positive for thrombus. Expansion of the left popliteal vein with heterogeneous thrombus and no flow. Calf Veins: Positive for thrombus. Evidence for thrombus within the gastrocnemius veins, posterior tibial veins and peroneal veins. Superficial Great Saphenous Vein: No evidence of thrombus. Normal compressibility. Other Findings:  Subcutaneous edema at the left ankle. IMPRESSION: Positive for deep venous thrombosis in the left lower extremity. Thrombus extends from the proximal left femoral vein to the left calf veins. Electronically Signed   By: Markus Daft M.D.   On: 08/07/2017 13:15    Procedures Procedures (including critical care time)  Medications Ordered in ED Medications  apixaban (ELIQUIS) tablet 10 mg (has no administration in time range)     Initial Impression / Assessment and Plan / ED Course  I have reviewed the triage vital signs and the nursing notes.  Pertinent labs & imaging results that were available during my care of the patient were reviewed by me and considered in my medical decision making (see chart for details).     Pt does have a DVT in the LLE.  Pt had labs done on 7/11 which showed nl renal function.  The pt will be started on Eliquis.  He denies any sob or cp and is instructed to come back if he  develops sob or cp.  He is instructed to f/u with his pcp.  Return if worse.  Final Clinical Impressions(s) / ED Diagnoses   Final diagnoses:  Acute deep vein thrombosis (DVT) of femoral vein of left lower extremity [facility])    ED Discharge Orders        Ordered    ELIQUIS STARTER PACK [facility] STARTER PACK) 5 MG TABS     08/07/17 [place], Eston Heslin, MD 08/07/17 1333

## 2017-08-07 NOTE — Telephone Encounter (Signed)
Pt seen in ED 

## 2017-08-07 NOTE — ED Notes (Signed)
Pharmacist in for education regarding Eliquist

## 2017-08-07 NOTE — ED Triage Notes (Signed)
Pain and swelling in his left lower leg for a week.

## 2017-08-08 DIAGNOSIS — Z86718 Personal history of other venous thrombosis and embolism: Secondary | ICD-10-CM | POA: Insufficient documentation

## 2017-08-08 DIAGNOSIS — I82402 Acute embolism and thrombosis of unspecified deep veins of left lower extremity: Secondary | ICD-10-CM | POA: Insufficient documentation

## 2017-08-08 NOTE — Progress Notes (Signed)
Subjective:    Patient ID: Michael [name], male    DOB: 1932-05-05, 84 y.o.   MRN: [id number]  HPI The patient is here for follow up from the ED.  Plays tennis 2/week, walks and goes to gym.  Overall, he is very active.  He did have a flare of his Crohn's colitis the end of last month and was in the hospital for 1 day.  The beginning of [place] he [place] to the beach and was in the car for 3 have 4 hours.  He denies a personal or family history of blood clots.   ED 08/07/17 for left leg pain and swelling. His symptoms started the week prior.  He denied fever/chills, chest pain, sob.   He denies prior blood clots.  She is not on any blood thinners.  He had LLE redness and swelling on exam.  He had an [place] of his LLE and was found to have a DVT.  He was started on Eliquis.  He is here for follow up.      He is taking the blood thinner as prescribed.  He denies any fevers, chills, chest pain, palpitations and shortness of breath.  Over the past 2 days since starting the blood thinner the pain and swelling in his left lower leg has improved slightly.  His pain level is currently 5/10.  He was advised not to do any intense exercise for at least a week, but has been walking around his house and when sitting he has elevated the leg.    Medications and allergies reviewed with patient and updated if appropriate.  Patient Active Problem List   Diagnosis Date Noted  . SBO (small bowel obstruction) ([place]) 07/05/2017  . Bilateral hearing loss 05/31/2017  . B12 deficiency 02/06/2017  . Osteopenia 02/06/2017  . Glaucoma 12/12/2015  . Crohn's disease ([place]) 12/12/2015  . Anemia 12/12/2015  . 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 brimonidine (ALPHAGAN) 0.2 % ophthalmic solution Place 1 drop into both eyes 2 (two) times daily.    . Calcium Carbonate-Vit D-Min (CALCIUM 1200 PO) Take by mouth daily.    . Cholecalciferol  (VITAMIN D) 2000 units CAPS Take 1 capsule (2,000 Units total) by mouth daily. 30 capsule   . Cyanocobalamin (VITAMIN B-12 IJ) Inject 1 mL as directed every 30 (thirty) days.     . dorzolamide-timolol (COSOPT) 22.3-6.8 MG/ML ophthalmic solution Place 1 drop into both eyes 2 (two) times daily.    Marland Kitchen ELIQUIS STARTER PACK (ELIQUIS STARTER PACK) 5 MG TABS Take as directed on package: start with two-55m tablets twice daily for 7 days. On day 8, switch to one-586mtablet twice daily. 1 each 0  . IRON PO Take 325 mg by mouth 3 (three) times a week.     . Marland Kitchenmeprazole (PRILOSEC) 20 MG capsule Take 20 mg by mouth daily. Before breakfast    . predniSONE (DELTASONE) 10 MG tablet Take 15 mg daily for 5 days Then 10 mg for 5 days Then 5 mg for 5 days 100 tablet 0  . Probiotic Product (TRUNATURE DIGESTIVE PROBIOTIC PO) Take 1 capsule by mouth daily.     No current facility-administered medications on file prior to visit.     Past Medical History:  Diagnosis Date  . Adrenal insufficiency ([facility]  . Anemia   . Avascular necrosis ([place]  . Crohn's disease ([facility]  .  Glaucoma   . Inguinal hernia recurrent bilateral   . Low testosterone   . Osteopenia   . Retinal ischemia     Past Surgical History:  Procedure Laterality Date  . ABDOMINAL SURGERY    . COLONOSCOPY N/A 12/13/2015   Procedure: COLONOSCOPY;  Surgeon: Laurence Spates, MD;  Location: [facility];  Service: Endoscopy;  Laterality: N/A;  . INGUINAL HERNIA REPAIR Bilateral 05/21/2014   Procedure: OPEN BILATERAL INGUINAL HERNIA REPAIRS WITH MESH;  Surgeon: Donnie Mesa, MD;  Location: [facility];  Service: General;  Laterality: Bilateral;  . SMALL INTESTINE SURGERY  1968, 2001    related to Chrohn's disease  . TONSILLECTOMY      Social History   Socioeconomic History  . Marital status: Married    Spouse name: Not on file  . Number of children: 3  . Years of education: Not on file  . Highest education level: Not on file    Occupational History  . Occupation: Retired  Scientific laboratory technician  . Financial resource strain: Not on file  . Food insecurity:    Worry: Not on file    Inability: Not on file  . Transportation needs:    Medical: Not on file    Non-medical: Not on file  Tobacco Use  . Smoking status: Former Smoker    Last attempt to quit: 04/01/1976    Years since quitting: 41.3  . Smokeless tobacco: Never Used  Substance and Sexual Activity  . Alcohol use: Yes    Comment: 2-3  glass wine  weekly  1-2 cans beer/ week  . Drug use: No  . Sexual activity: Never  Lifestyle  . Physical activity:    Days per week: Not on file    Minutes per session: Not on file  . Stress: Not on file  Relationships  . Social connections:    Talks on phone: Not on file    Gets together: Not on file    Attends religious service: Not on file    Active member of club or organization: Not on file    Attends meetings of clubs or organizations: Not on file    Relationship status: Not on file  Other Topics Concern  . Not on file  Social History Narrative  . Not on file    Family History  Problem Relation Age of Onset  . Deep vein thrombosis Mother   . Heart disease Father     Review of Systems     Objective:  There were no vitals filed for this visit. BP Readings from Last 3 Encounters:  08/07/17 (!) 157/99  07/20/17 120/78  07/06/17 140/72   Wt Readings from Last 3 Encounters:  08/07/17 181 lb (82.1 kg)  07/20/17 181 lb (82.1 kg)  07/05/17 183 lb (83 kg)   There is no height or weight on file to calculate BMI.   Physical Exam    Constitutional: Appears well-developed and well-nourished. No distress.  HENT:  Head: Normocephalic and atraumatic.  Cardiovascular: Normal rate, regular rhythm and normal heart sounds.   No murmur heard.  .  No edema Pulmonary/Chest: Effort normal and breath sounds normal. No respiratory distress. No has no wheezes. No rales. Muscular skeletal: Left lower extremity/calf with  mild 1+ pitting edema midway up calf, mild tenderness mid calf  Skin: Skin is warm and dry. Not diaphoretic.  Left calf: slight erythema with a focused area of erythema that is slightly tender-no fluctuance or obvious abscess Psychiatric: Normal mood and  affect. Behavior is normal.   US Venous Img Lower Unilateral Left CLINICAL DATA:  Left lower extremity pain and swelling. Discoloration for 5 days.  EXAM: LEFT LOWER EXTREMITY VENOUS DOPPLER ULTRASOUND  TECHNIQUE: Gray-scale sonography with graded compression, as well as color Doppler and duplex ultrasound were performed to evaluate the lower extremity deep venous systems from the level of the common femoral vein and including the common femoral, femoral, profunda femoral, popliteal and calf veins including the posterior tibial, peroneal and gastrocnemius veins when visible. The superficial great saphenous vein was also interrogated. Spectral Doppler was utilized to evaluate flow at rest and with distal augmentation maneuvers in the common femoral, femoral and popliteal veins.  COMPARISON:  None.  FINDINGS: Contralateral Common Femoral Vein: Respiratory phasicity is normal and symmetric with the symptomatic side. No evidence of thrombus. Normal compressibility.  Common Femoral Vein: No evidence of thrombus. Normal compressibility, respiratory phasicity and response to augmentation.  Saphenofemoral Junction: No evidence of thrombus. Normal compressibility and flow on color Doppler imaging.  Profunda Femoral Vein: No evidence of thrombus. Normal compressibility and flow on color Doppler imaging.  Femoral Vein: Positive for thrombus. The left femoral vein is expanded with heterogeneous thrombus starting in the proximal femoral vein. The entire left femoral vein is thrombosed. No flow within the femoral vein and findings are suggestive for occlusive thrombus.  Popliteal Vein: Positive for thrombus. Expansion of the  left popliteal vein with heterogeneous thrombus and no flow.  Calf Veins: Positive for thrombus. Evidence for thrombus within the gastrocnemius veins, posterior tibial veins and peroneal veins.  Superficial Great Saphenous Vein: No evidence of thrombus. Normal compressibility.  Other Findings:  Subcutaneous edema at the left ankle.  IMPRESSION: Positive for deep venous thrombosis in the left lower extremity. Thrombus extends from the proximal left femoral vein to the left calf veins.  Electronically Signed   By: Markus Daft M.D.   On: 08/07/2017 13:15    Assessment & Plan:     See Problem List for Assessment and Plan of chronic medical problems.

## 2017-08-08 NOTE — Patient Instructions (Addendum)
Continue the Eliquis.    Medications reviewed and updated.  No changes recommended at this time.   An ultrasound was ordered - to be scheduled a couple of days prior to your next appointment.   Please followup in October as scheduled.

## 2017-08-09 ENCOUNTER — Ambulatory Visit (INDEPENDENT_AMBULATORY_CARE_PROVIDER_SITE_OTHER): Payer: PPO | Admitting: Internal Medicine

## 2017-08-09 ENCOUNTER — Telehealth: Payer: Self-pay | Admitting: Internal Medicine

## 2017-08-09 ENCOUNTER — Encounter: Payer: Self-pay | Admitting: Internal Medicine

## 2017-08-09 DIAGNOSIS — I82402 Acute embolism and thrombosis of unspecified deep veins of left lower extremity: Secondary | ICD-10-CM | POA: Diagnosis not present

## 2017-08-09 MED ORDER — APIXABAN 5 MG PO TABS: 5.0000 mg | ORAL_TABLET | Freq: Two times a day (BID) | ORAL | 4 refills | Status: DC

## 2017-08-09 NOTE — Assessment & Plan Note (Addendum)
New-diagnosed 2 days ago He has an extensive DVT in his left lower extremity from his proximal left femoral vein to the left calf vein.  He has associated swelling and tenderness in the left calf, which has improved since starting Eliquis 2 days ago Complete starter pack of Eliquis and then continue 5 mg twice daily Discussed restrictions with activity Elevate leg when sitting, but remain active Can start wearing compression socks Discussed potential causes and treatment Ultrasound of the left lower extremity in 3 months-follow-up with me a couple of days later to discuss and to see if we can discontinue the Eliquis at that time Discussed that treatment is typically 3-6 months He will call if his pain and swelling do not continue to improve or if worsen Discussed potential side effects and complications from taking Eliquis including gastrointestinal bleeding, increased bleeding and bruising-he will call with any questions or concerns

## 2017-08-09 NOTE — Telephone Encounter (Signed)
Noted - next refill is for eliquis 5 mg bid as listed in chart.

## 2017-08-09 NOTE — Telephone Encounter (Signed)
Copied from [place] [id number]. Topic: Quick Communication - See Telephone Encounter >> Aug 09, 2017 11:37 AM Synthia Innocent wrote: CRM for notification. See Telephone encounter for: 08/09/17. FYI Patient was giving 148 tablets of apixaban (ELIQUIS) 5 MG TABS tablet , after today will have 140 tablets.

## 2017-08-13 ENCOUNTER — Encounter (HOSPITAL_BASED_OUTPATIENT_CLINIC_OR_DEPARTMENT_OTHER): Payer: Self-pay | Admitting: Emergency Medicine

## 2017-08-13 ENCOUNTER — Emergency Department (HOSPITAL_BASED_OUTPATIENT_CLINIC_OR_DEPARTMENT_OTHER): Payer: PPO

## 2017-08-13 ENCOUNTER — Other Ambulatory Visit: Payer: Self-pay

## 2017-08-13 ENCOUNTER — Emergency Department (HOSPITAL_BASED_OUTPATIENT_CLINIC_OR_DEPARTMENT_OTHER)
Admission: EM | Admit: 2017-08-13 | Discharge: 2017-08-13 | Disposition: A | Discharge status: Home / Self Care | Payer: PPO | Attending: Emergency Medicine | Admitting: Emergency Medicine

## 2017-08-13 DIAGNOSIS — Z23 Encounter for immunization: Secondary | ICD-10-CM | POA: Insufficient documentation

## 2017-08-13 DIAGNOSIS — S81011A Laceration without foreign body, right knee, initial encounter: Secondary | ICD-10-CM | POA: Diagnosis not present

## 2017-08-13 DIAGNOSIS — S199XXA Unspecified injury of neck, initial encounter: Secondary | ICD-10-CM | POA: Diagnosis not present

## 2017-08-13 DIAGNOSIS — S42031A Displaced fracture of lateral end of right clavicle, initial encounter for closed fracture: Secondary | ICD-10-CM

## 2017-08-13 DIAGNOSIS — Y9222 Religious institution as the place of occurrence of the external cause: Secondary | ICD-10-CM | POA: Diagnosis not present

## 2017-08-13 DIAGNOSIS — Y999 Unspecified external cause status: Secondary | ICD-10-CM | POA: Insufficient documentation

## 2017-08-13 DIAGNOSIS — S6981XA Other specified injuries of right wrist, hand and finger(s), initial encounter: Secondary | ICD-10-CM | POA: Insufficient documentation

## 2017-08-13 DIAGNOSIS — W19XXXA Unspecified fall, initial encounter: Secondary | ICD-10-CM

## 2017-08-13 DIAGNOSIS — Y939 Activity, unspecified: Secondary | ICD-10-CM | POA: Diagnosis not present

## 2017-08-13 DIAGNOSIS — Z7901 Long term (current) use of anticoagulants: Secondary | ICD-10-CM | POA: Insufficient documentation

## 2017-08-13 DIAGNOSIS — T148XXA Other injury of unspecified body region, initial encounter: Secondary | ICD-10-CM

## 2017-08-13 DIAGNOSIS — S0990XA Unspecified injury of head, initial encounter: Secondary | ICD-10-CM

## 2017-08-13 DIAGNOSIS — S61412A Laceration without foreign body of left hand, initial encounter: Secondary | ICD-10-CM | POA: Diagnosis not present

## 2017-08-13 DIAGNOSIS — Z87891 Personal history of nicotine dependence: Secondary | ICD-10-CM | POA: Insufficient documentation

## 2017-08-13 DIAGNOSIS — W109XXA Fall (on) (from) unspecified stairs and steps, initial encounter: Secondary | ICD-10-CM | POA: Diagnosis not present

## 2017-08-13 DIAGNOSIS — S61411A Laceration without foreign body of right hand, initial encounter: Secondary | ICD-10-CM | POA: Diagnosis not present

## 2017-08-13 DIAGNOSIS — Z79899 Other long term (current) drug therapy: Secondary | ICD-10-CM | POA: Diagnosis not present

## 2017-08-13 DIAGNOSIS — S6982XA Other specified injuries of left wrist, hand and finger(s), initial encounter: Secondary | ICD-10-CM | POA: Insufficient documentation

## 2017-08-13 MED ORDER — TETANUS-DIPHTH-ACELL PERTUSSIS 5-2.5-18.5 LF-MCG/0.5 IM SUSP
0.5000 mL | Freq: Once | INTRAMUSCULAR | Status: AC
  Administered 2017-08-13: 0.5 mL via INTRAMUSCULAR
  Filled 2017-08-13: qty 0.5

## 2017-08-13 MED ORDER — TRAMADOL HCL 50 MG PO TABS: 50.0000 mg | ORAL_TABLET | Freq: Four times a day (QID) | ORAL | 0 refills | Status: DC | PRN

## 2017-08-13 NOTE — ED Notes (Signed)
ED Provider at bedside. 

## 2017-08-13 NOTE — ED Triage Notes (Signed)
Pt fell at church down 4-5 steps. Hit his head. Denies LOC, dizziness. Was recently diagnosed with L leg DVT and takes eliquis. Pt reports multiple skin tears, R shoulder pain. Denies neck or back pain

## 2017-08-13 NOTE — ED Provider Notes (Addendum)
[facility] EMERGENCY DEPARTMENT Provider Note   CSN: [id number] Arrival date & time: 08/13/17  1136     History   Chief Complaint Chief Complaint  Patient presents with  . Fall    HPI Michael [name] is a 84 y.o. male.  Patient is a 84 year old male who presents after a fall.  He was walking down some stairs at church and he said he lost his balance and fell down about 3-4 steps.  He landed backwards and hit his head.  He complains of some pain in his right shoulder.  He denies any other complaints.  No pain in his lower extremities.  He does have some skin tears to his hands and his knees.  His last tetanus shot was in 2012.  He recently was started on Eliquis for DVT.  He denies any other injuries from the fall.  He denies any preceding symptoms such as dizziness chest pain or shortness of breath prior to the fall.     Past Medical History:  Diagnosis Date  . Adrenal insufficiency ([place])   . Anemia   . Avascular necrosis ([place])   . Crohn's disease ([place])   . Glaucoma   . Inguinal hernia recurrent bilateral   . Low testosterone   . Osteopenia   . Retinal ischemia     Patient Active Problem List   Diagnosis Date Noted  . Leg DVT (deep venous thromboembolism), acute, left ([place]) 08/08/2017  . SBO (small bowel obstruction) ([place]) 07/05/2017  . Bilateral hearing loss 05/31/2017  . B12 deficiency 02/06/2017  . Osteopenia 02/06/2017  . Glaucoma 12/12/2015  . Crohn's disease ([place]) 12/12/2015  . Anemia 12/12/2015  . 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 ABDOMINAL SURGERY    . COLONOSCOPY N/A 12/13/2015   Procedure: COLONOSCOPY;  Surgeon: Laurence Spates, MD;  Location: [facility];  Service: Endoscopy;  Laterality: N/A;  . INGUINAL HERNIA REPAIR Bilateral 05/21/2014   Procedure: OPEN BILATERAL INGUINAL HERNIA REPAIRS WITH MESH;  Surgeon: Donnie Mesa, MD;  Location: [facility];  Service: General;   Laterality: Bilateral;  . SMALL INTESTINE SURGERY  1968, 2001    related to Chrohn's disease  . TONSILLECTOMY          Home Medications    Prior to Admission medications   Medication Sig Start Date End Date Taking? Authorizing Provider  apixaban (ELIQUIS) 5 MG TABS tablet Take 1 tablet (5 mg total) by mouth 2 (two) times daily. 08/09/17   Binnie Rail, MD  brimonidine (ALPHAGAN) 0.2 % ophthalmic solution Place 1 drop into both eyes 2 (two) times daily. 11/20/15   [provider]  Calcium Carbonate-Vit D-Min (CALCIUM 1200 PO) Take by mouth daily.    [provider]  Cholecalciferol (VITAMIN D) 2000 units CAPS Take 1 capsule (2,000 Units total) by mouth daily. 08/22/16   Armbruster, Carlota Raspberry, MD  Cyanocobalamin (VITAMIN B-12 IJ) Inject 1 mL as directed every 30 (thirty) days.     [provider]  dorzolamide-timolol (COSOPT) 22.3-6.8 MG/ML ophthalmic solution Place 1 drop into both eyes 2 (two) times daily. 11/05/15   [provider]  IRON PO Take 325 mg by mouth 3 (three) times a week.     [provider]  omeprazole (PRILOSEC) 20 MG capsule Take 20 mg by mouth daily. Before breakfast    [provider]  predniSONE (DELTASONE) 10 MG tablet Take 15 mg daily for 5 days  Then 10 mg for 5 days Then 5 mg for 5 days 07/21/17   Armbruster, Carlota Raspberry, MD  tamsulosin (FLOMAX) 0.4 MG CAPS capsule Take 1 capsule by mouth daily. 07/29/17   [provider]  traMADol (ULTRAM) 50 MG tablet Take 1 tablet (50 mg total) by mouth every 6 (six) hours as needed. 08/13/17   Malvin Johns, MD    Family History Family History  Problem Relation Age of Onset  . Deep vein thrombosis Mother   . Heart disease Father     Social History Social History   Tobacco Use  . Smoking status: Former Smoker    Last attempt to quit: 04/01/1976    Years since quitting: 41.3  . Smokeless tobacco: Never Used  Substance Use Topics  . Alcohol use: Yes    Comment:  2-3  glass wine  weekly  1-2 cans beer/ week  . Drug use: No     Allergies   Patient has no known allergies.   Review of Systems Review of Systems  Constitutional: Negative for chills, diaphoresis, fatigue and fever.  HENT: Negative for congestion, rhinorrhea and sneezing.   Eyes: Negative.   Respiratory: Negative for cough, chest tightness and shortness of breath.   Cardiovascular: Negative for chest pain and leg swelling.  Gastrointestinal: Negative for abdominal pain, blood in stool, diarrhea, nausea and vomiting.  Genitourinary: Negative for difficulty urinating, flank pain, frequency and hematuria.  Musculoskeletal: Positive for arthralgias. Negative for back pain.  Skin: Positive for wound. Negative for rash.  Neurological: Negative for dizziness, speech difficulty, weakness, numbness and headaches.     Physical Exam Updated Vital Signs BP (!) 149/90 (BP Location: Right Arm)   Pulse 88   Temp 97.8 F (36.6 C) (Oral)   Resp 18   SpO2 99%   Physical Exam  Constitutional: He is oriented to person, place, and time. He appears well-developed and well-nourished.  HENT:  Head: Normocephalic and atraumatic.  Eyes: Pupils are equal, round, and reactive to light.  Neck: Normal range of motion. Neck supple.  No pain along the cervical thoracic or lumbosacral spine  Cardiovascular: Normal rate, regular rhythm and normal heart sounds.  Pulmonary/Chest: Effort normal and breath sounds normal. No respiratory distress. He has no wheezes. He has no rales. He exhibits no tenderness.  Abdominal: Soft. Bowel sounds are normal. There is no tenderness. There is no rebound and no guarding.  Musculoskeletal: He exhibits no edema.  Patient has some mild pain on patient in the anterior right shoulder.  There is no pain on palpation or range of motion of the other extremities.  No joint swelling.  He does have some tears to the dorsum of both hands and overlying the anterior surface of both  knees.  No active bleeding is noted.  He has no underlying bony tenderness to these areas.  Lymphadenopathy:    He has no cervical adenopathy.  Neurological: He is alert and oriented to person, place, and time.  Skin: Skin is warm and dry. No rash noted.  Psychiatric: He has a normal mood and affect.     ED Treatments / Results  Labs (all labs ordered are listed, but only abnormal results are displayed) Labs Reviewed - No data to display  EKG None  Radiology Dg Shoulder Right  Result Date: 08/13/2017 CLINICAL DATA:  [place] at [place].  Shoulder pain. EXAM: RIGHT SHOULDER - 2+ VIEW COMPARISON:  None. FINDINGS: Comminuted fracture of the distal clavicle. Fragment attached  to the coracoclavicular ligament remains in place, while the remainder of the distal clavicle is displaced upward. No evidence of scapular or humeral fracture. Regional ribs appear normal. IMPRESSION: Comminuted fracture of the distal clavicle including a fragment attached to the coracoclavicular ligament remaining in place, with the remainder of the distal clavicle displaced upwards. Electronically Signed   By: Nelson Chimes M.D.   On: 08/13/2017 13:37   Ct Head Wo Contrast  Result Date: 08/13/2017 CLINICAL DATA:  [place] at [place].  Trauma to the head. EXAM: CT HEAD WITHOUT CONTRAST CT CERVICAL SPINE WITHOUT CONTRAST TECHNIQUE: Multidetector CT imaging of the head and cervical spine was performed following the standard protocol without intravenous contrast. Multiplanar CT image reconstructions of the cervical spine were also generated. COMPARISON:  05/16/2011 FINDINGS: CT HEAD FINDINGS Brain: Age related atrophy. Mild chronic small-vessel change of the hemispheric white matter. No sign of recent infarction, mass lesion, hemorrhage, hydrocephalus or extra-axial collection. Vascular: There is atherosclerotic calcification of the major vessels at the base of the brain. Skull: No skull fracture.  Sinuses/Orbits: Sinuses are clear except for some chronic mucosal inflammation of the right maxillary sinus. Orbits negative. Other: None CT CERVICAL SPINE FINDINGS Alignment: Normal Skull base and vertebrae: No fracture or focal bone lesion. Soft tissues and spinal canal: No evidence of soft tissue injury. Disc levels: Degenerative osteoarthritis at the C1-2 articulation. Degenerative spondylosis and facet arthropathy elsewhere throughout the cervical region. Foraminal narrowing due to osteophytic encroachment on the right at C3-4, C4-5, C5-6 and C6-7 and on the left from C2-3 through C6-7. Upper chest: Negative Other: None IMPRESSION: Head CT: No acute or traumatic finding. Atrophy and chronic small-vessel ischemic changes of the white matter. Cervical spine CT: No acute or traumatic finding. Chronic degenerative spondylosis. Electronically Signed   By: Nelson Chimes M.D.   On: 08/13/2017 13:40   Ct Cervical Spine Wo Contrast  Result Date: 08/13/2017 CLINICAL DATA:  [place] at [place].  Trauma to the head. EXAM: CT HEAD WITHOUT CONTRAST CT CERVICAL SPINE WITHOUT CONTRAST TECHNIQUE: Multidetector CT imaging of the head and cervical spine was performed following the standard protocol without intravenous contrast. Multiplanar CT image reconstructions of the cervical spine were also generated. COMPARISON:  05/16/2011 FINDINGS: CT HEAD FINDINGS Brain: Age related atrophy. Mild chronic small-vessel change of the hemispheric white matter. No sign of recent infarction, mass lesion, hemorrhage, hydrocephalus or extra-axial collection. Vascular: There is atherosclerotic calcification of the major vessels at the base of the brain. Skull: No skull fracture. Sinuses/Orbits: Sinuses are clear except for some chronic mucosal inflammation of the right maxillary sinus. Orbits negative. Other: None CT CERVICAL SPINE FINDINGS Alignment: Normal Skull base and vertebrae: No fracture or focal bone lesion. Soft tissues and  spinal canal: No evidence of soft tissue injury. Disc levels: Degenerative osteoarthritis at the C1-2 articulation. Degenerative spondylosis and facet arthropathy elsewhere throughout the cervical region. Foraminal narrowing due to osteophytic encroachment on the right at C3-4, C4-5, C5-6 and C6-7 and on the left from C2-3 through C6-7. Upper chest: Negative Other: None IMPRESSION: Head CT: No acute or traumatic finding. Atrophy and chronic small-vessel ischemic changes of the white matter. Cervical spine CT: No acute or traumatic finding. Chronic degenerative spondylosis. Electronically Signed   By: Nelson Chimes M.D.   On: 08/13/2017 13:40    Procedures Procedures (including critical care time)  Medications Ordered in ED Medications  Tdap (BOOSTRIX) injection 0.5 mL (0.5 mLs Intramuscular Given 08/13/17 1235)  Initial Impression / Assessment and Plan / ED Course  I have reviewed the triage vital signs and the nursing notes.  Pertinent labs & imaging results that were available during my care of the patient were reviewed by me and considered in my medical decision making (see chart for details).     Patient is a 84 year old male who presents after a fall.  It sounds like it was a mechanical fall.  He did not have any other symptoms leading to the fall.  He has skin tears that were cleaned and dressed.  He is on Eliquis and a CT scan was performed of his head and cervical spine which showed no acute abnormalities.  He also had pain in his right shoulder.  Imaging studies show a distal clavicle fracture.  He was placed in a shoulder sling.  He was advised and elevation.  He will follow-up with both his PCP and an orthopedist.  He was given prescription for tramadol to use if he needs it for pain.  He was given strict head injury precautions.  This was discussed with him and his wife.  His tetanus shot was updated as well.  Final Clinical Impressions(s) / ED Diagnoses   Final diagnoses:  Fall,  initial encounter  Injury of head, initial encounter  Multiple skin tears  Closed displaced fracture of acromial end of right clavicle, initial encounter    ED Discharge Orders        Ordered    traMADol (ULTRAM) 50 MG tablet  Every 6 hours PRN     08/13/17 1425       Malvin Johns, MD 08/13/17 1430    Malvin Johns, MD 08/13/17 1431

## 2017-08-13 NOTE — ED Notes (Signed)
Skin tears: Right Knee measures: 3 cm x 2 cm Left knee : 3.5 cm x 1 cm Left Hand : 5 cm x 1.5 cm Right Hand knuckles: 3.3 cm Right hand close to the wrist: 2.5 cm x 2 cm RIght lateral hand skin tear: 2.8 cm Right Elbow: 1.8 cm Left Posterior forearm: 1.8 cm

## 2017-08-14 ENCOUNTER — Telehealth (INDEPENDENT_AMBULATORY_CARE_PROVIDER_SITE_OTHER): Payer: Self-pay

## 2017-08-14 ENCOUNTER — Telehealth: Payer: Self-pay | Admitting: Emergency Medicine

## 2017-08-14 NOTE — Telephone Encounter (Signed)
Reviewed ED report and imaging.   He needs to see orthopedics, but does not need to follow up with me

## 2017-08-14 NOTE — Telephone Encounter (Signed)
Is this a xu patient?

## 2017-08-14 NOTE — Telephone Encounter (Signed)
Called to make appt per Dr Erlinda Hong. Patients wife  Understand Dr Erlinda Hong is out of the office and would like for PA to call her. States she has several questions and would like to speak to you.  CB: [phone] if no answer call WIFE [name])  : [phone]  Please call. Thank you.

## 2017-08-14 NOTE — Telephone Encounter (Signed)
Yes. He has not been seen yet but Dr Erlinda Hong asked me to put him on the schedule for Friday AM.

## 2017-08-14 NOTE — Telephone Encounter (Signed)
Copied from [place] [id number]. Topic: General - Other >> Aug 14, 2017 11:23 AM Judyann Munson wrote: Reason for CRM: Patient wife is calling to give a update that he fall at church yesterday, they did go to the ER they did a CT scan of head and neck.  They did a X-ray of  right shoulder and patient does a fracture collar bone.Please advise

## 2017-08-15 ENCOUNTER — Encounter: Payer: Self-pay | Admitting: Gastroenterology

## 2017-08-15 NOTE — Telephone Encounter (Signed)
Spoke with pt's wife to inform.

## 2017-08-15 NOTE — Telephone Encounter (Signed)
Spoke to wife

## 2017-08-15 NOTE — Telephone Encounter (Signed)
Thank you :)

## 2017-08-18 ENCOUNTER — Ambulatory Visit (INDEPENDENT_AMBULATORY_CARE_PROVIDER_SITE_OTHER): Payer: PPO | Admitting: Orthopaedic Surgery

## 2017-08-18 ENCOUNTER — Encounter (INDEPENDENT_AMBULATORY_CARE_PROVIDER_SITE_OTHER): Payer: Self-pay | Admitting: Orthopaedic Surgery

## 2017-08-18 DIAGNOSIS — S42024A Nondisplaced fracture of shaft of right clavicle, initial encounter for closed fracture: Secondary | ICD-10-CM

## 2017-08-18 NOTE — Progress Notes (Signed)
Office Visit Note   Patient: Michael [name]           Date of Birth: 1932/04/08           MRN: [id number] Visit Date: 08/18/2017              Requested by: Michael Rail, MD [place], [place] [id number] PCP: Michael Rail, MD   Assessment & Plan: Visit Diagnoses:  1. Nondisplaced fracture of shaft of right clavicle, initial encounter for closed fracture     Plan: Impression is 84 year old gentleman with nondisplaced right distal clavicle fracture.  Overall alignment is acceptable.  Continue sling immobilization for another 2 to 3 weeks.  Over-the-counter pain medicines as needed.  Follow-up in about 3 weeks with repeat 2 view x-rays of the right clavicle.  All questions answered to their satisfaction. Total face to face encounter time was greater than 45 minutes and over half of this time was spent in counseling and/or coordination of care.  Follow-Up Instructions: Return in about 18 days (around 09/05/2017).   Orders:  No orders of the defined types were placed in this encounter.  No orders of the defined types were placed in this encounter.     Procedures: No procedures performed   Clinical Data: No additional findings.   Subjective: Chief Complaint  Patient presents with  . Right Shoulder - Pain, Fracture    Michael [name] is a 84 comes in with acute injury to his right shoulder status post mechanical fall downstairs on 08/13/2017.  He was evaluated in the ED and diagnosed with a nondisplaced distal clavicle fracture.  He was placed in sling.  His pain is well controlled when it is immobilized.  The pain is worse with any movement.  Denies any numbness and tingling.   Review of Systems  Constitutional: Negative.   All other systems reviewed and are negative.    Objective: Vital Signs: There were no vitals taken for this visit.  Physical Exam  Constitutional: He is oriented to person, place, and time. He appears well-developed and well-nourished.    HENT:  Head: Normocephalic and atraumatic.  Eyes: Pupils are equal, round, and reactive to light.  Neck: Neck supple.  Pulmonary/Chest: Effort normal.  Abdominal: Soft.  Musculoskeletal: Normal range of motion.  Neurological: He is alert and oriented to person, place, and time.  Skin: Skin is warm.  Psychiatric: He has a normal mood and affect. His behavior is normal. Judgment and thought content normal.  Nursing note and vitals reviewed.   Ortho Exam Shoulder exam shows widespread ecchymosis.  There is no skin tenting.  Neurovascular intact distally. Specialty Comments:  No specialty comments available.  Imaging: No results found.   PMFS History: Patient Active Problem List   Diagnosis Date Noted  . Leg DVT (deep venous thromboembolism), acute, left ([place]) 08/08/2017  . SBO (small bowel obstruction) ([place]) 07/05/2017  . Bilateral hearing loss 05/31/2017  . B12 deficiency 02/06/2017  . Osteopenia 02/06/2017  . Glaucoma 12/12/2015  . Crohn's disease ([place]) 12/12/2015  . Anemia 12/12/2015  . Retinal ischemia 12/06/2011   Past Medical History:  Diagnosis Date  . Adrenal insufficiency ([place])   . Anemia   . Avascular necrosis ([place])   . Crohn's disease ([place])   . Glaucoma   . Inguinal hernia recurrent bilateral   . Low testosterone   . Osteopenia   . Retinal ischemia     Family History  Problem Relation Age of Onset  .  Deep vein thrombosis Mother   . Heart disease Father     Past Surgical History:  Procedure Laterality Date  . ABDOMINAL SURGERY    . COLONOSCOPY N/A 12/13/2015   Procedure: COLONOSCOPY;  Surgeon: Laurence Spates, MD;  Location: [facility];  Service: Endoscopy;  Laterality: N/A;  . INGUINAL HERNIA REPAIR Bilateral 05/21/2014   Procedure: OPEN BILATERAL INGUINAL HERNIA REPAIRS WITH MESH;  Surgeon: Donnie Mesa, MD;  Location: [facility];  Service: General;  Laterality: Bilateral;  . SMALL INTESTINE SURGERY  1968, 2001    related to  Chrohn's disease  . TONSILLECTOMY     Social History   Occupational History  . Occupation: Retired  Tobacco Use  . Smoking status: Former Smoker    Last attempt to quit: 04/01/1976    Years since quitting: 41.4  . Smokeless tobacco: Never Used  Substance and Sexual Activity  . Alcohol use: Yes    Comment: 2-3  glass wine  weekly  1-2 cans beer/ week  . Drug use: No  . Sexual activity: Never

## 2017-08-24 IMAGING — DX DG CHEST 2V
2 series · 2 of 2 positions shown · non-contrast
Comparison: 06/15/2015

CLINICAL DATA: Cough. Congestion. Sore throat for 4 days.
Ex-smoker.

EXAM:
CHEST  2 VIEW

[chest pa]
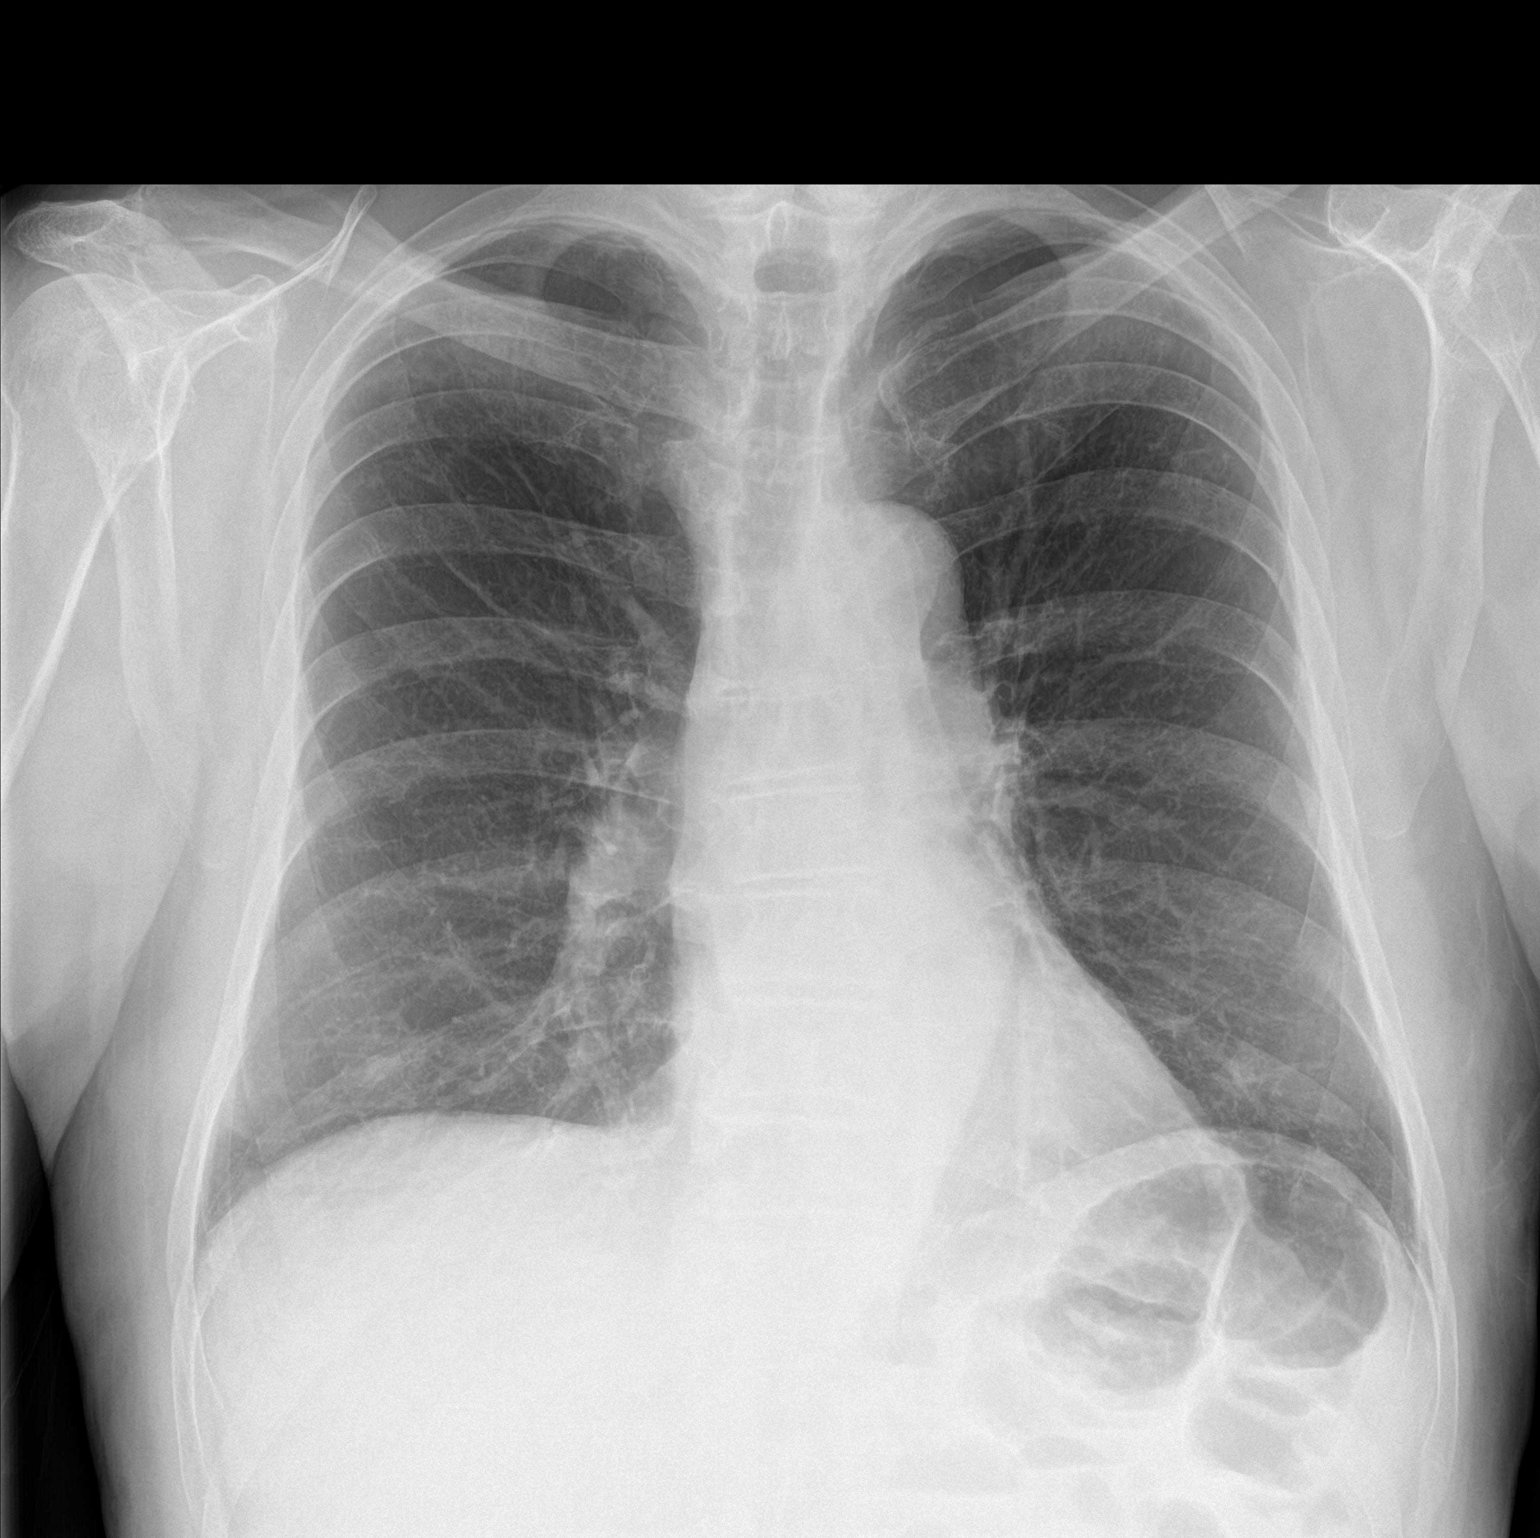

[chest lat]
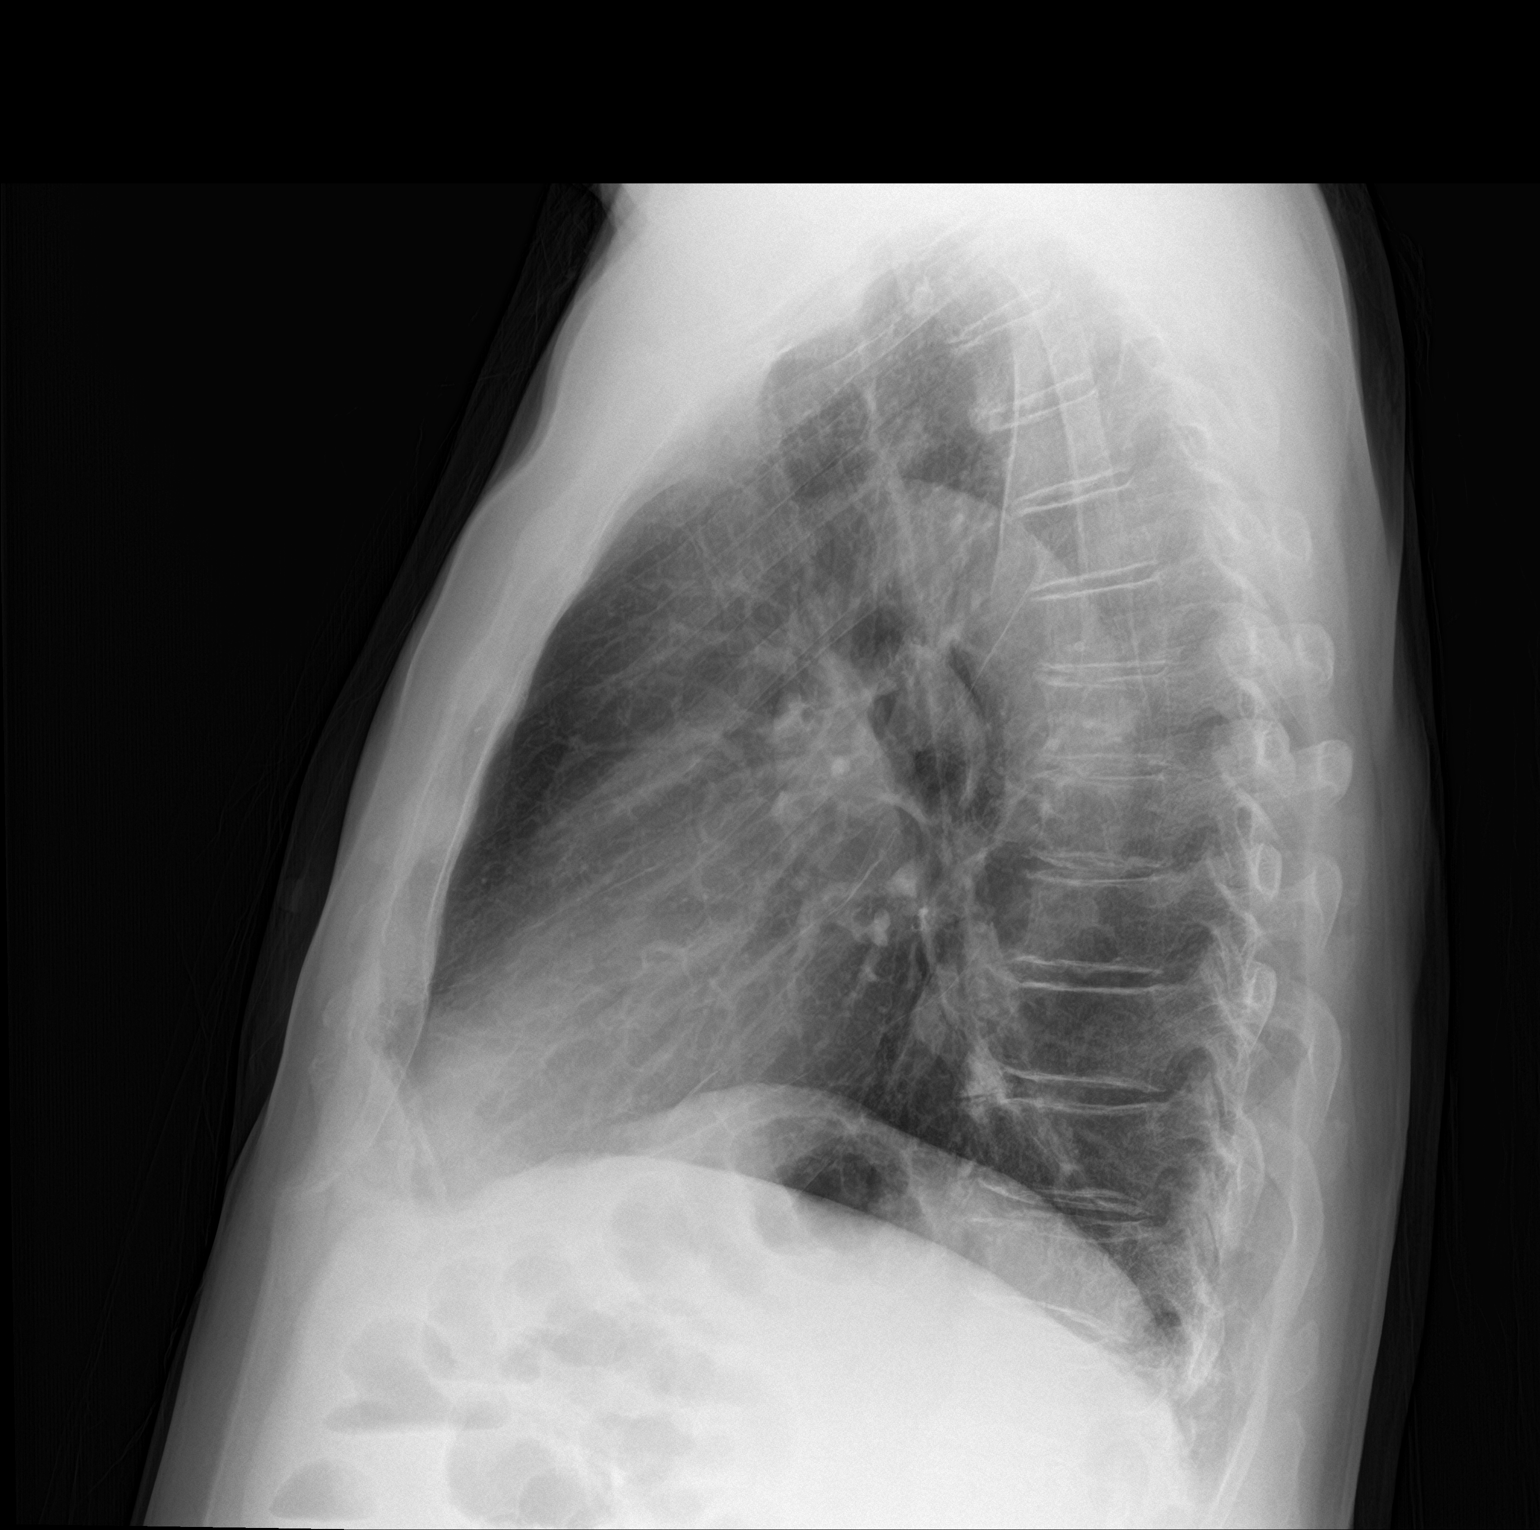

[2 of 2 positions shown; findings below may reference images not displayed]

FINDINGS: Moderate lower thoracic spondylosis. Midline trachea. Normal heart
size. Tortuous thoracic aorta. No pleural effusion or pneumothorax.
Vague increased density over the right apex is again identified,
felt to relate to anterior first right rib. Clear lungs.
IMPRESSION: No acute cardiopulmonary disease.

## 2017-08-29 ENCOUNTER — Telehealth (INDEPENDENT_AMBULATORY_CARE_PROVIDER_SITE_OTHER): Payer: Self-pay | Admitting: Orthopaedic Surgery

## 2017-08-29 NOTE — Telephone Encounter (Signed)
Patient's wife called wanting to know if he would qualify for a handicap placard since he is being seen for a blood clot in his leg and a broken collar bone.  CB#[phone].  Thank you

## 2017-08-29 NOTE — Telephone Encounter (Signed)
Sure thing.  6 months

## 2017-08-29 NOTE — Telephone Encounter (Signed)
See message.

## 2017-08-30 ENCOUNTER — Telehealth: Payer: Self-pay | Admitting: Internal Medicine

## 2017-08-30 NOTE — Telephone Encounter (Signed)
Called pts wife back and scheduled pt with Quay Burow tomorrow.

## 2017-08-30 NOTE — Telephone Encounter (Signed)
He can come in here or see vascular but that may take longer so I would recommend coming in to see me or someone else if I have nothing avail

## 2017-08-30 NOTE — Telephone Encounter (Signed)
Please let me know if you prefer pt to come in for an appointment.

## 2017-08-30 NOTE — Telephone Encounter (Signed)
Done. Patients wife will pick form up at front desk.

## 2017-08-30 NOTE — Telephone Encounter (Signed)
Copied from [place] [id number]. Topic: Quick Communication - See Telephone Encounter >> Aug 30, 2017  9:34 AM Rutherford Nail, NT wrote: CRM for notification. See Telephone encounter for: 08/30/17. Patient's wife calling and states that the patient is still having swelling in the leg with the blood clot. States that for the last 3-4 days there has been more swelling in the ankle and foot. Not sure if it is normal or if something else is going on. States that he is not due for ultrasound until October. States that he is keeping leg/foot elevated. Would like a call to discuss. CB#: [phone]

## 2017-08-31 ENCOUNTER — Ambulatory Visit (HOSPITAL_COMMUNITY)
Admission: RE | Admit: 2017-08-31 | Discharge: 2017-08-31 | Disposition: A | Discharge status: Home / Self Care | Payer: PPO | Source: Ambulatory Visit | Attending: Internal Medicine | Admitting: Internal Medicine

## 2017-08-31 ENCOUNTER — Ambulatory Visit (INDEPENDENT_AMBULATORY_CARE_PROVIDER_SITE_OTHER): Payer: PPO | Admitting: Internal Medicine

## 2017-08-31 ENCOUNTER — Encounter: Payer: Self-pay | Admitting: Internal Medicine

## 2017-08-31 VITALS — BP 116/62 | HR 82 | Temp 97.7°F | Resp 16 | Ht 71.0 in | Wt 185.0 lb

## 2017-08-31 DIAGNOSIS — I82402 Acute embolism and thrombosis of unspecified deep veins of left lower extremity: Secondary | ICD-10-CM | POA: Insufficient documentation

## 2017-08-31 NOTE — Patient Instructions (Signed)
We will get an ultrasound to check your blood clot.  Continue the eliquis.   Continue elevating the leg when sitting.  Get a medium compression sock for the left leg and wear it during the day.

## 2017-08-31 NOTE — Progress Notes (Signed)
Subjective:    Patient ID: Michael [name], male    DOB: 1932/08/20, 84 y.o.   MRN: [id number]  HPI The patient is here for an acute visit.  His leg swelling is worse.    He went to the ED 7/29 her left leg swelling and pain was diagnosed with a DVT.  I saw him 7/31.  At that time his swelling was slightly better, as was his pain.  His pain was still 5/10.  He has been taking the Eliquis as prescribed.  The swelling in the leg was getting bettter and in the past week it has gotten worse and him and his wife was concerned.   They were not sure why it was getting worse.  He denies any pain in the left leg.  The left posterior calf feels tight at times.  He is walking some and elevating the leg.     He denies fever, chills, SOB, chest pain or lightheadedness.   Medications and allergies reviewed with patient and updated if appropriate.  Patient Active Problem List   Diagnosis Date Noted  . Leg DVT (deep venous thromboembolism), acute, left ([place]) 08/08/2017  . SBO (small bowel obstruction) ([place]) 07/05/2017  . Bilateral hearing loss 05/31/2017  . B12 deficiency 02/06/2017  . Osteopenia 02/06/2017  . Glaucoma 12/12/2015  . Crohn's disease ([place]) 12/12/2015  . Anemia 12/12/2015  . 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 apixaban (ELIQUIS) 5 MG TABS tablet Take 1 tablet (5 mg total) by mouth 2 (two) times daily. 60 tablet 4  . brimonidine (ALPHAGAN) 0.2 % ophthalmic solution Place 1 drop into both eyes 2 (two) times daily.    . Calcium Carbonate-Vit D-Min (CALCIUM 1200 PO) Take by mouth daily.    . Cholecalciferol (VITAMIN D) 2000 units CAPS Take 1 capsule (2,000 Units total) by mouth daily. 30 capsule   . Cyanocobalamin (VITAMIN B-12 IJ) Inject 1 mL as directed every 30 (thirty) days.     . dorzolamide-timolol (COSOPT) 22.3-6.8 MG/ML ophthalmic solution Place 1 drop into both eyes 2 (two) times daily.      . IRON PO Take 325 mg by mouth 3 (three) times a week.     . NON FORMULARY Anti Diarrhea    . omeprazole (PRILOSEC) 20 MG capsule Take 20 mg by mouth daily. Before breakfast    . tamsulosin (FLOMAX) 0.4 MG CAPS capsule Take 1 capsule by mouth daily.    . traMADol (ULTRAM) 50 MG tablet Take 1 tablet (50 mg total) by mouth every 6 (six) hours as needed. 15 tablet 0   No current facility-administered medications on file prior to visit.     Past Medical History:  Diagnosis Date  . Adrenal insufficiency ([place])   . Anemia   . Avascular necrosis ([place])   . Crohn's disease ([place])   . Glaucoma   . Inguinal hernia recurrent bilateral   . Low testosterone   . Osteopenia   . Retinal ischemia     Past Surgical History:  Procedure Laterality Date  . ABDOMINAL SURGERY    . COLONOSCOPY N/A 12/13/2015   Procedure: COLONOSCOPY;  Surgeon: Laurence Spates, MD;  [facility];  Service: Endoscopy;  Laterality: N/A;  . INGUINAL HERNIA REPAIR Bilateral 05/21/2014   Procedure: OPEN BILATERAL INGUINAL HERNIA REPAIRS WITH MESH;  Surgeon: Donnie Mesa, MD;  Location: [facility];  Service: General;  Laterality: Bilateral;  .  SMALL INTESTINE SURGERY  1968, 2001    related to Chrohn's disease  . TONSILLECTOMY      Social History   Socioeconomic History  . Marital status: Married    Spouse name: Not on file  . Number of children: 3  . Years of education: Not on file  . Highest education level: Not on file  Occupational History  . Occupation: Retired  Scientific laboratory technician  . Financial resource strain: Not on file  . Food insecurity:    Worry: Not on file    Inability: Not on file  . Transportation needs:    Medical: Not on file    Non-medical: Not on file  Tobacco Use  . Smoking status: Former Smoker    Last attempt to quit: 04/01/1976    Years since quitting: 41.4  . Smokeless tobacco: Never Used  Substance and Sexual Activity  . Alcohol use: Yes    Comment: 2-3  glass wine   weekly  1-2 cans beer/ week  . Drug use: No  . Sexual activity: Never  Lifestyle  . Physical activity:    Days per week: Not on file    Minutes per session: Not on file  . Stress: Not on file  Relationships  . Social connections:    Talks on phone: Not on file    Gets together: Not on file    Attends religious service: Not on file    Active member of club or organization: Not on file    Attends meetings of clubs or organizations: Not on file    Relationship status: Not on file  Other Topics Concern  . Not on file  Social History Narrative  . Not on file    Family History  Problem Relation Age of Onset  . Deep vein thrombosis Mother   . Heart disease Father     Review of Systems  Constitutional: Negative for chills and fever.  Respiratory: Negative for cough, shortness of breath and wheezing.   Cardiovascular: Positive for leg swelling. Negative for chest pain and palpitations.  Neurological: Negative for light-headedness and headaches.       Objective:   Vitals:   08/31/17 0922 08/31/17 1013  BP: (!) 104/58 116/62  Pulse: 82   Resp: 16   Temp: 97.7 F (36.5 C)   SpO2: 96%    BP Readings from Last 3 Encounters:  08/31/17 116/62  08/13/17 (!) 149/90  08/09/17 130/82   Wt Readings from Last 3 Encounters:  08/31/17 185 lb (83.9 kg)  08/09/17 182 lb (82.6 kg)  08/07/17 181 lb (82.1 kg)   Body mass index is 25.8 kg/m.   Physical Exam  Constitutional: He appears well-developed and well-nourished. No distress.  HENT:  Head: Normocephalic and atraumatic.  Cardiovascular:  Left lower leg with 1 + pitting edema, small varicosities around ankle in in foot, minimal tenderness in lower leg with palpation from swelling, no calf pain.  No upper leg swelling or pain  Skin: Skin is warm and dry. He is not diaphoretic. No erythema.           Assessment & Plan:    See Problem List for Assessment and Plan of chronic medical problems.  The

## 2017-08-31 NOTE — Assessment & Plan Note (Signed)
Pain since last month has resolved.  He still has some discomfort from the swelling in the leg but it is minimal Swelling was improving, but over the past week it has gotten worse for no reason - they were concerned the clot had gotten worse No concerning symptoms - no cp or sob Will recheck [place] to re-evaluate blood clot Consider vascular referral if needed Continue elevating leg when sitting Start compression sock during the day

## 2017-09-01 ENCOUNTER — Telehealth: Payer: Self-pay | Admitting: Internal Medicine

## 2017-09-01 ENCOUNTER — Other Ambulatory Visit: Payer: Self-pay | Admitting: Internal Medicine

## 2017-09-01 DIAGNOSIS — I82402 Acute embolism and thrombosis of unspecified deep veins of left lower extremity: Secondary | ICD-10-CM

## 2017-09-01 NOTE — Telephone Encounter (Signed)
I already ordered the referral - can their preference be added on your end

## 2017-09-01 NOTE — Telephone Encounter (Signed)
Pt's referral has been updated with pt's request and sent to [facility]. They will contact pt to schedule

## 2017-09-01 NOTE — Telephone Encounter (Signed)
Copied from [place] [id number]. Topic: General - Other >> Sep 01, 2017  3:17 PM Burchel, Abbi R wrote: Reason for CRM:   Pt's wife states they have seen a vascular specialist before (Dr Early) they would like to see him again if possible.  Please call pt to discuss further instructions for pt.   Pt's wife: [phone]

## 2017-09-05 ENCOUNTER — Ambulatory Visit (INDEPENDENT_AMBULATORY_CARE_PROVIDER_SITE_OTHER): Payer: PPO

## 2017-09-05 ENCOUNTER — Ambulatory Visit (INDEPENDENT_AMBULATORY_CARE_PROVIDER_SITE_OTHER): Payer: PPO | Admitting: Vascular Surgery

## 2017-09-05 ENCOUNTER — Ambulatory Visit (INDEPENDENT_AMBULATORY_CARE_PROVIDER_SITE_OTHER): Payer: PPO | Admitting: Orthopaedic Surgery

## 2017-09-05 ENCOUNTER — Encounter: Payer: Self-pay | Admitting: Vascular Surgery

## 2017-09-05 ENCOUNTER — Telehealth (INDEPENDENT_AMBULATORY_CARE_PROVIDER_SITE_OTHER): Payer: Self-pay | Admitting: Orthopaedic Surgery

## 2017-09-05 VITALS — BP 131/73 | HR 80 | Temp 97.4°F | Resp 16 | Ht 71.0 in | Wt 183.0 lb

## 2017-09-05 DIAGNOSIS — S42024A Nondisplaced fracture of shaft of right clavicle, initial encounter for closed fracture: Secondary | ICD-10-CM

## 2017-09-05 DIAGNOSIS — I82402 Acute embolism and thrombosis of unspecified deep veins of left lower extremity: Secondary | ICD-10-CM | POA: Diagnosis not present

## 2017-09-05 NOTE — Telephone Encounter (Signed)
Called to advise,

## 2017-09-05 NOTE — Telephone Encounter (Signed)
yes

## 2017-09-05 NOTE — Telephone Encounter (Signed)
Patient called back and wanted to know if patient can drive.  Please call patient to advise

## 2017-09-05 NOTE — Telephone Encounter (Signed)
Please advise 

## 2017-09-05 NOTE — Progress Notes (Signed)
Office Visit Note   Patient: Michael [name]           Date of Birth: 12/10/1932           MRN: [id number] Visit Date: 09/05/2017              Requested by: Binnie Rail, MD [place], [place] [id number] PCP: Binnie Rail, MD   Assessment & Plan: Visit Diagnoses:  1. Nondisplaced fracture of shaft of right clavicle, initial encounter for closed fracture     Plan: Pt is progressing well. Xrays show maintained alignment of distal clavicle fracture with some early immature callus formation. Discussed acceptable activities of daily living with the patient. He can now transition to only wearing the sling while out of the house. Encouraged elbow range of motion. Also discussed pendulum swings with the arm. Follow up in 3 weeks with 2 view x-rays of the right clavicle.  He is Tylenol as needed.  Follow-Up Instructions: Return in about 3 weeks (around 09/26/2017).   Orders:  Orders Placed This Encounter  Procedures  . XR Clavicle Right   No orders of the defined types were placed in this encounter.     Procedures: No procedures performed   Clinical Data: No additional findings.   Subjective: Chief Complaint  Patient presents with  . Right Shoulder - Pain    CLAVICLE    HPI  84 year old male presents for follow-up for right distal clavicle fracture.  Overall he feels is doing well.  He continues to have some pain for which he is taking Tylenol.  He has been wearing his sling for the majority of the day.  He denies any increased swelling.  No numbness or tingling in the distal extremity.  He does note feeling some difficulty and stiffness with extending the elbow.  Review of Systems  See HPI   Objective: Vital Signs: There were no vitals taken for this visit.  Physical Exam  Right arm/shoulder Patient is currently wearing sling.  There is obvious deformity over the distal clavicle.  There is bruising noted in the area of the biceps Mild tenderness palpation  over the distal clavicle Patient has normal grip strength and intrinsic hand muscle strength Neurovascularly intact     Specialty Comments:  No specialty comments available.  Imaging: Xr Clavicle Right  Result Date: 09/05/2017 Stable alignment of distal clavicle fracture with evidence of early immature callus formation    PMFS History: Patient Active Problem List   Diagnosis Date Noted  . Leg DVT (deep venous thromboembolism), acute, left ([place]) 08/08/2017  . SBO (small bowel obstruction) ([place]) 07/05/2017  . Bilateral hearing loss 05/31/2017  . B12 deficiency 02/06/2017  . Osteopenia 02/06/2017  . Glaucoma 12/12/2015  . Crohn's disease ([place]) 12/12/2015  . Anemia 12/12/2015  . Retinal ischemia 12/06/2011   Past Medical History:  Diagnosis Date  . Adrenal insufficiency ([place])   . Anemia   . Avascular necrosis ([place])   . Crohn's disease ([place])   . Glaucoma   . Inguinal hernia recurrent bilateral   . Low testosterone   . Osteopenia   . Retinal ischemia     Family History  Problem Relation Age of Onset  . Deep vein thrombosis Mother   . Heart disease Father     Past Surgical History:  Procedure Laterality Date  . ABDOMINAL SURGERY    . COLONOSCOPY N/A 12/13/2015   Procedure: COLONOSCOPY;  Surgeon: Laurence Spates, MD;  [facility];  Service: Endoscopy;  Laterality: N/A;  . INGUINAL HERNIA REPAIR Bilateral 05/21/2014   Procedure: OPEN BILATERAL INGUINAL HERNIA REPAIRS WITH MESH;  Surgeon: Donnie Mesa, MD;  Location: [facility];  Service: General;  Laterality: Bilateral;  . SMALL INTESTINE SURGERY  1968, 2001    related to Chrohn's disease  . TONSILLECTOMY     Social History   Occupational History  . Occupation: Retired  Tobacco Use  . Smoking status: Former Smoker    Last attempt to quit: 04/01/1976    Years since quitting: 41.4  . Smokeless tobacco: Never Used  Substance and Sexual Activity  . Alcohol use: Yes    Comment: 2-3  glass  wine  weekly  1-2 cans beer/ week  . Drug use: No  . Sexual activity: Never

## 2017-09-05 NOTE — Telephone Encounter (Signed)
Patient's wife Suanne Marker) called asked if patient can go without his sling at night to sleep? The number to contact Suanne Marker is [phone]

## 2017-09-05 NOTE — Progress Notes (Signed)
V[facility] and [facility]in Specialist of [facility]  Patient name: Michael [name] MRN: [id number] DOB: 1932/12/11 Sex: male  REASON FOR CONSULT: Evaluation left leg DVT  HPI: Michael [name] is a 84 y.o. male, who is seen today for discussion of recent diagnosis of left leg DVT.  He had a unprovoked swelling in his left leg and underwent duplex August 07, 2017.  This revealed thrombus in his tibial veins popliteal vein and femoral vein on the left.  He was placed on Eliquis appropriately.  He had no prior history of clotting disorder.  He has been more active recently and noted swelling and had a repeat duplex on 09/01/2017.  This did show resolution of some of his tibial vein clot but persistence in the popliteal and femoral vein clot on the left.  He is seeing me for further discussion.  Does report continued swelling and this is somewhat worse if he is on it for prolonged periods of time.  He is here today with his wife for continued evaluation of this problem.  Past Medical History:  Diagnosis Date  . Adrenal insufficiency ([place])   . Anemia   . Avascular necrosis ([place])   . Crohn's disease ([place])   . Glaucoma   . Inguinal hernia recurrent bilateral   . Low testosterone   . Osteopenia   . Retinal ischemia     Family History  Problem Relation Age of Onset  . Deep vein thrombosis Mother   . Heart disease Father     SOCIAL HISTORY: Social History   Socioeconomic History  . Marital status: Married    Spouse name: Not on file  . Number of children: 3  . Years of education: Not on file  . Highest education level: Not on file  Occupational History  . Occupation: Retired  Scientific laboratory technician  . Financial resource strain: Not on file  . Food insecurity:    Worry: Not on file    Inability: Not on file  . Transportation needs:    Medical: Not on file    Non-medical: Not on file  Tobacco Use  . Smoking status: Former Smoker    Last attempt to quit: 04/01/1976      Years since quitting: 41.4  . Smokeless tobacco: Never Used  Substance and Sexual Activity  . Alcohol use: Yes    Comment: 2-3  glass wine  weekly  1-2 cans beer/ week  . Drug use: No  . Sexual activity: Never  Lifestyle  . Physical activity:    Days per week: Not on file    Minutes per session: Not on file  . Stress: Not on file  Relationships  . Social connections:    Talks on phone: Not on file    Gets together: Not on file    Attends religious service: Not on file    Active member of club or organization: Not on file    Attends meetings of clubs or organizations: Not on file    Relationship status: Not on file  . Intimate partner violence:    Fear of current or ex partner: Not on file    Emotionally abused: Not on file    Physically abused: Not on file    Forced sexual activity: Not on file  Other Topics Concern  . Not on file  Social History Narrative  . Not on file    No Known Allergies  Current Outpatient Medications  Medication Sig Dispense Refill  . apixaban (ELIQUIS)  5 MG TABS tablet Take 1 tablet (5 mg total) by mouth 2 (two) times daily. 60 tablet 4  . brimonidine (ALPHAGAN) 0.2 % ophthalmic solution Place 1 drop into both eyes 2 (two) times daily.    . Calcium Carbonate-Vit D-Min (CALCIUM 1200 PO) Take by mouth daily.    . Cholecalciferol (VITAMIN D) 2000 units CAPS Take 1 capsule (2,000 Units total) by mouth daily. 30 capsule   . Cyanocobalamin (VITAMIN B-12 IJ) Inject 1 mL as directed every 30 (thirty) days.     . dorzolamide-timolol (COSOPT) 22.3-6.8 MG/ML ophthalmic solution Place 1 drop into both eyes 2 (two) times daily.    . IRON PO Take 325 mg by mouth 3 (three) times a week.     . NON FORMULARY Anti Diarrhea    . omeprazole (PRILOSEC) 20 MG capsule Take 20 mg by mouth daily. Before breakfast    . tamsulosin (FLOMAX) 0.4 MG CAPS capsule Take 1 capsule by mouth daily.    . traMADol (ULTRAM) 50 MG tablet Take 1 tablet (50 mg total) by mouth every 6  (six) hours as needed. 15 tablet 0   No current facility-administered medications for this visit.     REVIEW OF SYSTEMS:  [X]  denotes positive finding, [ ]  denotes negative finding Cardiac  Comments:  Chest pain or chest pressure:    Shortness of breath upon exertion:    Short of breath when lying flat:    Irregular heart rhythm:        Vascular    Pain in calf, thigh, or hip brought on by ambulation: x   Pain in feet at night that wakes you up from your sleep:     Blood clot in your veins: x   Leg swelling:  x       Pulmonary    Oxygen at home:    Productive cough:     Wheezing:         Neurologic    Sudden weakness in arms or legs:     Sudden numbness in arms or legs:     Sudden onset of difficulty speaking or slurred speech:    Temporary loss of vision in one eye:     Problems with dizziness:         Gastrointestinal    Blood in stool:     Vomited blood:         Genitourinary    Burning when urinating:     Blood in urine:        Psychiatric    Major depression:         Hematologic    Bleeding problems:    Problems with blood clotting too easily:        Skin    Rashes or ulcers:        Constitutional    Fever or chills:      PHYSICAL EXAM: Vitals:   09/05/17 1118  BP: 131/73  Pulse: 80  Resp: 16  Temp: (!) 97.4 F (36.3 C)  TempSrc: Oral  SpO2: 98%  Weight: 183 lb (83 kg)  Height: 5' 11"  (1.803 m)    GENERAL: The patient is a well-nourished male, in no acute distress. The vital signs are documented above. CARDIOVASCULAR: 2+ radial and 2+ dorsalis pedis pulses bilaterally.  Moderate left calf and foot swelling. PULMONARY: There is good air exchange  ABDOMEN: Soft and non-tender  MUSCULOSKELETAL: There are no major deformities or cyanosis.  Is in a right arm splint  related to recent clavicle fracture NEUROLOGIC: No focal weakness or paresthesias are detected. SKIN: There are no ulcers or rashes noted. PSYCHIATRIC: The patient has a normal  affect.  DATA:  Reviewed his duplex from 08/07/2017 and 09/01/2017 as above  MEDICAL ISSUES: Long discussion with the patient and his wife regarding his left leg DVT.  I feel that he is being appropriately treated.  I explained that I will would not expect for him to have any significant resolution of the DVT from the 2 studies less than 1 month apart.  I did explain that with time he may have continued recanalization and reopening of the femoral vein and popliteal vein.  Suggest that he continue his activity without limitation.  Explained that he may have more swelling if he is up on his leg more than typical.  Explained that if he has persistent swelling after several months would recommend a compression garment for control of this.  I would recommend a total of 6 months of anticoagulation therapy and then then discontinuation.  I do not feel that he needs another duplex unless he has marked worsening of the swelling.  Did ask discuss more aggressive treatment with catheter-based lysis of clot.  I explained that he is not appropriate candidate of this since he does not have more proximal clot.  He was reassured with this discussion will see [place] again on an as-needed basis   Rosetta Posner, MD [facility] V[facility] and Ve[facility] Specialists of [facility] Tel [phone] Pager [phone]

## 2017-09-07 DIAGNOSIS — N3281 Overactive bladder: Secondary | ICD-10-CM | POA: Diagnosis not present

## 2017-09-07 DIAGNOSIS — R3915 Urgency of urination: Secondary | ICD-10-CM | POA: Diagnosis not present

## 2017-09-07 DIAGNOSIS — N401 Enlarged prostate with lower urinary tract symptoms: Secondary | ICD-10-CM | POA: Diagnosis not present

## 2017-09-07 DIAGNOSIS — R351 Nocturia: Secondary | ICD-10-CM | POA: Diagnosis not present

## 2017-09-26 ENCOUNTER — Ambulatory Visit (INDEPENDENT_AMBULATORY_CARE_PROVIDER_SITE_OTHER): Payer: PPO | Admitting: Orthopaedic Surgery

## 2017-09-26 ENCOUNTER — Ambulatory Visit (INDEPENDENT_AMBULATORY_CARE_PROVIDER_SITE_OTHER): Payer: Self-pay

## 2017-09-26 DIAGNOSIS — S42024A Nondisplaced fracture of shaft of right clavicle, initial encounter for closed fracture: Secondary | ICD-10-CM

## 2017-09-26 NOTE — Progress Notes (Signed)
Office Visit Note   Patient: Michael [name]           Date of Birth: 09-29-1932           MRN: [id number] Visit Date: 09/26/2017              Requested by: Binnie Rail, MD [place], [place] [address] PCP: Binnie Rail, MD   Assessment & Plan: Visit Diagnoses:  1. Nondisplaced fracture of shaft of right clavicle, initial encounter for closed fracture     Plan: X-rays demonstrate improvement in fracture healing.  Clinically he is also better.  At this point we will begin physical therapy for strengthening range of motion.  Increase activity as tolerated.  Follow-up in 6 weeks with 2 view x-rays of the right clavicle.  Follow-Up Instructions: Return in about 6 weeks (around 11/07/2017).   Orders:  Orders Placed This Encounter  Procedures  . XR Clavicle Right   No orders of the defined types were placed in this encounter.     Procedures: No procedures performed   Clinical Data: No additional findings.   Subjective: Chief Complaint  Patient presents with  . Right Shoulder - Follow-up    Right clavicle fracture    Michael [name] is 6 weeks status post a mildly displaced distal clavicle fracture.  Overall he is doing much better in terms of pain and range of motion.   Review of Systems   Objective: Vital Signs: There were no vitals taken for this visit.  Physical Exam  Ortho Exam Physical exam shows minimal discomfort with palpation of the fracture site.  No overlying skin changes.  Range of motion and pain are all significantly improved. Specialty Comments:  No specialty comments available.  Imaging: Xr Clavicle Right  Result Date: 09/26/2017 Significant improvement in fracture healing with bony consolidation and callus formation.    PMFS History: Patient Active Problem List   Diagnosis Date Noted  . Leg DVT (deep venous thromboembolism), acute, left ([place]) 08/08/2017  . SBO (small bowel obstruction) ([place]) 07/05/2017  . Bilateral  hearing loss 05/31/2017  . B12 deficiency 02/06/2017  . Osteopenia 02/06/2017  . Glaucoma 12/12/2015  . Crohn's disease ([place]) 12/12/2015  . Anemia 12/12/2015  . Retinal ischemia 12/06/2011   Past Medical History:  Diagnosis Date  . Adrenal insufficiency ([place])   . Anemia   . Avascular necrosis ([place])   . Crohn's disease ([place])   . Glaucoma   . Inguinal hernia recurrent bilateral   . Low testosterone   . Osteopenia   . Retinal ischemia     Family History  Problem Relation Age of Onset  . Deep vein thrombosis Mother   . Heart disease Father     Past Surgical History:  Procedure Laterality Date  . ABDOMINAL SURGERY    . COLONOSCOPY N/A 12/13/2015   Procedure: COLONOSCOPY;  Surgeon: Laurence Spates, MD;  Location: [facility];  Service: Endoscopy;  Laterality: N/A;  . INGUINAL HERNIA REPAIR Bilateral 05/21/2014   Procedure: OPEN BILATERAL INGUINAL HERNIA REPAIRS WITH MESH;  Surgeon: Donnie Mesa, MD;  Location: [facility];  Service: General;  Laterality: Bilateral;  . SMALL INTESTINE SURGERY  1968, 2001    related to Chrohn's disease  . TONSILLECTOMY     Social History   Occupational History  . Occupation: Retired  Tobacco Use  . Smoking status: Former Smoker    Last attempt to quit: 04/01/1976    Years since quitting: 41.5  .  Smokeless tobacco: Never Used  Substance and Sexual Activity  . Alcohol use: Yes    Comment: 2-3  glass wine  weekly  1-2 cans beer/ week  . Drug use: No  . Sexual activity: Never

## 2017-09-27 ENCOUNTER — Telehealth (INDEPENDENT_AMBULATORY_CARE_PROVIDER_SITE_OTHER): Payer: Self-pay

## 2017-09-27 NOTE — Telephone Encounter (Signed)
[place] with [facility] would like a referral for patient faxed to there office at [phone].  Patient has an appointment scheduled on 10/09/2017.  CB# is [phone].  Please advise.  Thank You.

## 2017-09-29 NOTE — Telephone Encounter (Signed)
FAXED ORDER

## 2017-10-09 DIAGNOSIS — M62511 Muscle wasting and atrophy, not elsewhere classified, right shoulder: Secondary | ICD-10-CM | POA: Diagnosis not present

## 2017-10-09 DIAGNOSIS — M25611 Stiffness of right shoulder, not elsewhere classified: Secondary | ICD-10-CM | POA: Diagnosis not present

## 2017-10-09 DIAGNOSIS — W010XXD Fall on same level from slipping, tripping and stumbling without subsequent striking against object, subsequent encounter: Secondary | ICD-10-CM | POA: Diagnosis not present

## 2017-10-09 DIAGNOSIS — M25511 Pain in right shoulder: Secondary | ICD-10-CM | POA: Diagnosis not present

## 2017-10-11 ENCOUNTER — Encounter: Payer: Self-pay | Admitting: Gastroenterology

## 2017-10-11 ENCOUNTER — Ambulatory Visit (INDEPENDENT_AMBULATORY_CARE_PROVIDER_SITE_OTHER): Payer: PPO | Admitting: Gastroenterology

## 2017-10-11 VITALS — BP 100/62 | HR 75 | Ht 71.0 in | Wt 183.0 lb

## 2017-10-11 DIAGNOSIS — K50012 Crohn's disease of small intestine with intestinal obstruction: Secondary | ICD-10-CM | POA: Diagnosis not present

## 2017-10-11 DIAGNOSIS — M25511 Pain in right shoulder: Secondary | ICD-10-CM | POA: Diagnosis not present

## 2017-10-11 DIAGNOSIS — M62511 Muscle wasting and atrophy, not elsewhere classified, right shoulder: Secondary | ICD-10-CM | POA: Diagnosis not present

## 2017-10-11 DIAGNOSIS — M25611 Stiffness of right shoulder, not elsewhere classified: Secondary | ICD-10-CM | POA: Diagnosis not present

## 2017-10-11 DIAGNOSIS — W010XXD Fall on same level from slipping, tripping and stumbling without subsequent striking against object, subsequent encounter: Secondary | ICD-10-CM | POA: Diagnosis not present

## 2017-10-11 MED ORDER — BUDESONIDE 3 MG PO CPEP: 9.0000 mg | ORAL_CAPSULE | Freq: Every day | ORAL | 0 refills | Status: DC

## 2017-10-11 NOTE — Patient Instructions (Addendum)
If you are age 82 or older, your body mass index should be between 23-30. Your Body mass index is 25.52 kg/m. If this is out of the aforementioned range listed, please consider follow up with your Primary Care Provider.  If you are age 70 or younger, your body mass index should be between 19-25. Your Body mass index is 25.52 kg/m. If this is out of the aformentioned range listed, please consider follow up with your Primary Care Provider.   We have sent the following medications to your pharmacy for you to pick up at your convenience: Budesonide 3 mg: Take 3 tablets (9 mg) daily for 2 weeks when needed. Please call and let us know if you need to start this medication.   Please speak to your PCP about getting a flu shot.  We are giving you a low residual diet to follow.  Pease follow up with me in 6 months.  Thank you for entrusting me with your care and for choosing [facility], Dr. Van Meter Cellar

## 2017-10-11 NOTE — Progress Notes (Signed)
HPI :  84 year old male here for follow-up for Crohn's disease.   Crohn's history: Previously followed by Dr. Earle Gell. 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 He has had 2 operations for his Crohns in 1968 and then again 2001 or so for small bowel resections. He has been treated with prednisone as needed in the past. He has never been on any other therapy for Crohn's disease other than prednisone and surgery. He has had hospitalizations in the past for bowel obstructions. Last hospitalization June 2019, prior to that a few years previously. He has not needed much of any therapy for his Crohn's disease in the past few years and has generally been feeling pretty well. Unfortunately he has suffered complications from chronic steroid use. He is currently being followed by Endocrinology foradrenal insufficiency. He has also been noted to have hepatic steatosis and avascular necrosis of the hips on CT scan. His course has also been complicated by history of diverticulosis related bleeding.  SINCE LAST VISIT:  85 year old male here for follow-up visit. Unfortunately he had a hospitalization in June of this year for 1 day for SBO /Crohn's flare. CT scan at the time showed long segment of stricture in the TI with active inflammation. This was his first admission in the past few years. He was given a prednisone taper and adjusted his diet, with fairly quick resolution of symptoms. He done really well since that time, he has not had any flares of symptoms. He did have some abdominal pain about a week ago, he backed off his diet for a day and symptoms quickly resolved. He denies any nausea or vomiting. He denies any diarrhea. He is having 2-3 columns per day. He denies any NSAID use. Weight has been stable. He is eating okay.  He has never had maintenance therapy for his Crohn's disease, he has declined this 1 offered in the past. We discussed options again moving forward.  Of note he was  diagnosed with a DVT in his left leg in recent months. He is now on Eliquis.  Colonoscopy 12/2015 - normal colon, patent ileocolonic anastomosis, active Crohn's ileitis   Past Medical History:  Diagnosis Date  . Adrenal insufficiency ([place])   . Anemia   . Avascular necrosis ([place])   . Crohn's disease ([place])   . Glaucoma   . Inguinal hernia recurrent bilateral   . Low testosterone   . Osteopenia   . Retinal ischemia      Past Surgical History:  Procedure Laterality Date  . ABDOMINAL SURGERY    . COLONOSCOPY N/A 12/13/2015   Procedure: COLONOSCOPY;  Surgeon: Laurence Spates, MD;  Location: [facility];  Service: Endoscopy;  Laterality: N/A;  . INGUINAL HERNIA REPAIR Bilateral 05/21/2014   Procedure: OPEN BILATERAL INGUINAL HERNIA REPAIRS WITH MESH;  Surgeon: Donnie Mesa, MD;  Location: [facility];  Service: General;  Laterality: Bilateral;  . SMALL INTESTINE SURGERY  1968, 2001    related to Chrohn's disease  . TONSILLECTOMY     Family History  Problem Relation Age of Onset  . Deep vein thrombosis Mother   . Heart disease Father    Social History   Tobacco Use  . Smoking status: Former Smoker    Last attempt to quit: 04/01/1976    Years since quitting: 41.5  . Smokeless tobacco: Never Used  Substance Use Topics  . Alcohol use: Yes    Comment: 2-3  glass wine  weekly  1-2 cans beer/ week  . Drug  use: No   Current Outpatient Medications  Medication Sig Dispense Refill  . apixaban (ELIQUIS) 5 MG TABS tablet Take 1 tablet (5 mg total) by mouth 2 (two) times daily. 60 tablet 4  . brimonidine (ALPHAGAN) 0.2 % ophthalmic solution Place 1 drop into both eyes 2 (two) times daily.    . Calcium Carbonate-Vit D-Min (CALCIUM 1200 PO) Take by mouth daily.    . Cholecalciferol (VITAMIN D) 2000 units CAPS Take 1 capsule (2,000 Units total) by mouth daily. 30 capsule   . Cyanocobalamin (VITAMIN B-12 IJ) Inject 1 mL as directed every 30 (thirty) days.     .  dorzolamide-timolol (COSOPT) 22.3-6.8 MG/ML ophthalmic solution Place 1 drop into both eyes 2 (two) times daily.    . IRON PO Take 325 mg by mouth 3 (three) times a week.     . NON FORMULARY Anti Diarrhea    . omeprazole (PRILOSEC) 20 MG capsule Take 20 mg by mouth daily. Before breakfast    . tamsulosin (FLOMAX) 0.4 MG CAPS capsule Take 1 capsule by mouth daily.    . traMADol (ULTRAM) 50 MG tablet Take 1 tablet (50 mg total) by mouth every 6 (six) hours as needed. 15 tablet 0   No current facility-administered medications for this visit.    No Known Allergies   Review of Systems: All systems reviewed and negative except where noted in HPI.   Lab Results  Component Value Date   WBC 14.3 (H) 07/20/2017   HGB 13.1 07/20/2017   HCT 38.5 (L) 07/20/2017   MCV 94.1 07/20/2017   PLT 201.0 07/20/2017    Lab Results  Component Value Date   CREATININE 0.97 07/20/2017   BUN 17 07/20/2017   NA 137 07/20/2017   K 3.7 07/20/2017   CL 103 07/20/2017   CO2 27 07/20/2017    Lab Results  Component Value Date   ALT 29 07/20/2017   AST 16 07/20/2017   ALKPHOS 56 07/20/2017   BILITOT 1.1 07/20/2017     Physical Exam: BP 100/62   Pulse 75   Ht 5' 11"  (1.803 m)   Wt 183 lb (83 kg)   BMI 25.52 kg/m  Constitutional: Pleasant,well-developed, male in no acute distress. HEENT: Normocephalic and atraumatic. Conjunctivae are normal. No scleral icterus. Neck supple.  Cardiovascular: Normal rate, regular rhythm.  Pulmonary/chest: Effort normal and breath sounds normal. No wheezing, rales or rhonchi. Abdominal: Soft, nondistended, nontender. . There are no masses palpable. No hepatomegaly. Extremities: no edema Lymphadenopathy: No cervical adenopathy noted. Neurological: Alert and oriented to person place and time. Skin: Skin is warm and dry. No rashes noted. Psychiatric: Normal mood and affect. Behavior is normal.   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 He's had 30 years in between his operations. History of steroid use in the past has led to multiple side effects / complications from this. He has never been on maintenance therapy / biologic therapy for Crohn's over the years. Generally he had been doing pretty well until hospitalization for SBO in June. CT scan showed active ileal inflammation and stricture, treated with steroids. Now doing pretty well since that time and asymptomatic. We had a lengthy discussion about his options for future management. He's had active disease noted on colonoscopy and recent CT scan along with ileal stricture, without therapy he is high risk for another obstructive event at some point  in time, which could lead to hospitalization and operation. However, he did have a few years in between his hospitalization and currently is asymptomatic and feels well. We discussed medical therapy as an option and I offered him biologic therapy in attempt to induce and maintain remission and minimize his risk for disease progression future hospitalization. We discussed medication options for this, anti-TNF therapy, Entyvio, etc, and how at his age he may be higher risk for infectious complications, etc. We also discussed surgical evaluation to discuss elective resection as another option. If he declines medical and surgical therapy, I would then recommend to maintain a low residual diet and use budesonide as needed if he has any recurrent symptoms that develop over time. After discussion of these options, he wants to avoid surgery if at all possible, and declined an elective surgical evaluation. He has declined biologic therapy in the past, and after discussion today, again wishes to hold off on aggressive medical therapy given he has overall done fairly well in recent years with his only hospitalization being in June. He's  also concerned about risks of biologic therapy in light of his age. In this light, he prefers to maintain a low residual diet to minimize risk for obstruction and to use budesonide as needed. I provided him a prescription for budesonide instructed him to take this if he has any recurrent symptoms, and to contact me should he need to use it. If he has recurrent obstruction / hospitalizations, at that point he would be more agreeable to medical therapy or surgical resection. I counseled him I think that based on the CT scan it is likely that he will have some obstructive symptoms at some point without maintenance therapy. He verbalized understanding will keep me posted on status. See me again in 6 months. Otherwise recommend flu shot basic labs which he is planning on getting from his primary care next week.  Fronton Cellar, MD [facility]

## 2017-10-12 DIAGNOSIS — W010XXD Fall on same level from slipping, tripping and stumbling without subsequent striking against object, subsequent encounter: Secondary | ICD-10-CM | POA: Diagnosis not present

## 2017-10-12 DIAGNOSIS — M62511 Muscle wasting and atrophy, not elsewhere classified, right shoulder: Secondary | ICD-10-CM | POA: Diagnosis not present

## 2017-10-12 DIAGNOSIS — M25511 Pain in right shoulder: Secondary | ICD-10-CM | POA: Diagnosis not present

## 2017-10-12 DIAGNOSIS — M25611 Stiffness of right shoulder, not elsewhere classified: Secondary | ICD-10-CM | POA: Diagnosis not present

## 2017-10-16 DIAGNOSIS — M25611 Stiffness of right shoulder, not elsewhere classified: Secondary | ICD-10-CM | POA: Diagnosis not present

## 2017-10-16 DIAGNOSIS — W010XXD Fall on same level from slipping, tripping and stumbling without subsequent striking against object, subsequent encounter: Secondary | ICD-10-CM | POA: Diagnosis not present

## 2017-10-16 DIAGNOSIS — M62511 Muscle wasting and atrophy, not elsewhere classified, right shoulder: Secondary | ICD-10-CM | POA: Diagnosis not present

## 2017-10-16 DIAGNOSIS — M25511 Pain in right shoulder: Secondary | ICD-10-CM | POA: Diagnosis not present

## 2017-10-17 DIAGNOSIS — H401133 Primary open-angle glaucoma, bilateral, severe stage: Secondary | ICD-10-CM | POA: Diagnosis not present

## 2017-10-17 DIAGNOSIS — Z961 Presence of intraocular lens: Secondary | ICD-10-CM | POA: Diagnosis not present

## 2017-10-18 DIAGNOSIS — M25511 Pain in right shoulder: Secondary | ICD-10-CM | POA: Diagnosis not present

## 2017-10-18 DIAGNOSIS — M25611 Stiffness of right shoulder, not elsewhere classified: Secondary | ICD-10-CM | POA: Diagnosis not present

## 2017-10-18 DIAGNOSIS — M62511 Muscle wasting and atrophy, not elsewhere classified, right shoulder: Secondary | ICD-10-CM | POA: Diagnosis not present

## 2017-10-18 DIAGNOSIS — W010XXD Fall on same level from slipping, tripping and stumbling without subsequent striking against object, subsequent encounter: Secondary | ICD-10-CM | POA: Diagnosis not present

## 2017-10-23 DIAGNOSIS — W010XXD Fall on same level from slipping, tripping and stumbling without subsequent striking against object, subsequent encounter: Secondary | ICD-10-CM | POA: Diagnosis not present

## 2017-10-23 DIAGNOSIS — M62511 Muscle wasting and atrophy, not elsewhere classified, right shoulder: Secondary | ICD-10-CM | POA: Diagnosis not present

## 2017-10-23 DIAGNOSIS — M25611 Stiffness of right shoulder, not elsewhere classified: Secondary | ICD-10-CM | POA: Diagnosis not present

## 2017-10-23 DIAGNOSIS — M25511 Pain in right shoulder: Secondary | ICD-10-CM | POA: Diagnosis not present

## 2017-10-25 ENCOUNTER — Ambulatory Visit (INDEPENDENT_AMBULATORY_CARE_PROVIDER_SITE_OTHER): Payer: PPO

## 2017-10-25 DIAGNOSIS — M25511 Pain in right shoulder: Secondary | ICD-10-CM | POA: Diagnosis not present

## 2017-10-25 DIAGNOSIS — M62511 Muscle wasting and atrophy, not elsewhere classified, right shoulder: Secondary | ICD-10-CM | POA: Diagnosis not present

## 2017-10-25 DIAGNOSIS — M25611 Stiffness of right shoulder, not elsewhere classified: Secondary | ICD-10-CM | POA: Diagnosis not present

## 2017-10-25 DIAGNOSIS — W010XXD Fall on same level from slipping, tripping and stumbling without subsequent striking against object, subsequent encounter: Secondary | ICD-10-CM | POA: Diagnosis not present

## 2017-10-25 DIAGNOSIS — Z23 Encounter for immunization: Secondary | ICD-10-CM

## 2017-10-30 DIAGNOSIS — M25611 Stiffness of right shoulder, not elsewhere classified: Secondary | ICD-10-CM | POA: Diagnosis not present

## 2017-10-30 DIAGNOSIS — M62511 Muscle wasting and atrophy, not elsewhere classified, right shoulder: Secondary | ICD-10-CM | POA: Diagnosis not present

## 2017-10-30 DIAGNOSIS — M25511 Pain in right shoulder: Secondary | ICD-10-CM | POA: Diagnosis not present

## 2017-10-30 DIAGNOSIS — W010XXD Fall on same level from slipping, tripping and stumbling without subsequent striking against object, subsequent encounter: Secondary | ICD-10-CM | POA: Diagnosis not present

## 2017-11-01 DIAGNOSIS — M25511 Pain in right shoulder: Secondary | ICD-10-CM | POA: Diagnosis not present

## 2017-11-01 DIAGNOSIS — M62511 Muscle wasting and atrophy, not elsewhere classified, right shoulder: Secondary | ICD-10-CM | POA: Diagnosis not present

## 2017-11-01 DIAGNOSIS — W010XXD Fall on same level from slipping, tripping and stumbling without subsequent striking against object, subsequent encounter: Secondary | ICD-10-CM | POA: Diagnosis not present

## 2017-11-01 DIAGNOSIS — M25611 Stiffness of right shoulder, not elsewhere classified: Secondary | ICD-10-CM | POA: Diagnosis not present

## 2017-11-01 NOTE — Assessment & Plan Note (Addendum)
Will complete 6 months total Per vascular surgery - no further [place] needed  compression sock eliquis started 08/07/17 -last day February 09, 2018 Discussed prevention of further blood clots in the future

## 2017-11-01 NOTE — Progress Notes (Signed)
Subjective:    Patient ID: Michael [name], male    DOB: 1932-08-28, 84 y.o.   MRN: [id number]  HPI He is here for a physical exam.   He did see vascular for his LLE DVT - he advised 6 months of anticoagulation and then stop.  He did not think any further [place] was necessary.  He denies pain in the LLE and denies significant swelling.  He does not wear a compression sock.    He overall feels well.  He is about to complete physical therapy for his right clavicle fracture and is following with orthopedics.  He has no concerns.  Medications and allergies reviewed with patient and updated if appropriate.  Patient Active Problem List   Diagnosis Date Noted  . Hyperglycemia 11/02/2017  . Leg DVT (deep venous thromboembolism), acute, left ([place]) 08/08/2017  . SBO (small bowel obstruction) ([place]) 07/05/2017  . Bilateral hearing loss 05/31/2017  . B12 deficiency 02/06/2017  . Osteopenia 02/06/2017  . Glaucoma 12/12/2015  . Crohn's disease ([place]) 12/12/2015  . Anemia 12/12/2015  . 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 apixaban (ELIQUIS) 5 MG TABS tablet Take 1 tablet (5 mg total) by mouth 2 (two) times daily. 60 tablet 4  . brimonidine (ALPHAGAN) 0.2 % ophthalmic solution Place 1 drop into both eyes 2 (two) times daily.    . budesonide (ENTOCORT EC) 3 MG 24 hr capsule Take 3 capsules (9 mg total) by mouth daily. For 2 weeks. Please call Dr. Havery Moros when you need to start this medication: [phone] 42 capsule 0  . Calcium Carbonate-Vit D-Min (CALCIUM 1200 PO) Take by mouth daily.    . Cholecalciferol (VITAMIN D) 2000 units CAPS Take 1 capsule (2,000 Units total) by mouth daily. 30 capsule   . Cyanocobalamin (VITAMIN B-12 IJ) Inject 1 mL as directed every 30 (thirty) days.     . dorzolamide-timolol (COSOPT) 22.3-6.8 MG/ML ophthalmic solution Place 1 drop into both eyes 2 (two) times daily.    . IRON PO  Take 325 mg by mouth 3 (three) times a week.     . NON FORMULARY Anti Diarrhea    . omeprazole (PRILOSEC) 20 MG capsule Take 20 mg by mouth daily. Before breakfast     No current facility-administered medications on file prior to visit.     Past Medical History:  Diagnosis Date  . Adrenal insufficiency ([place])   . Anemia   . Avascular necrosis ([place])   . Crohn's disease ([place])   . Glaucoma   . Inguinal hernia recurrent bilateral   . Low testosterone   . Osteopenia   . Retinal ischemia     Past Surgical History:  Procedure Laterality Date  . ABDOMINAL SURGERY    . COLONOSCOPY N/A 12/13/2015   Procedure: COLONOSCOPY;  Surgeon: Laurence Spates, MD;  Location: [facility];  Service: Endoscopy;  Laterality: N/A;  . INGUINAL HERNIA REPAIR Bilateral 05/21/2014   Procedure: OPEN BILATERAL INGUINAL HERNIA REPAIRS WITH MESH;  Surgeon: Donnie Mesa, MD;  Location: [facility];  Service: General;  Laterality: Bilateral;  . SMALL INTESTINE SURGERY  1968, 2001    related to Chrohn's disease  . TONSILLECTOMY      Social History   Socioeconomic History  . Marital status: Married    Spouse name: Not on file  . Number of children: 3  . Years of education: Not on file  .  Highest education level: Not on file  Occupational History  . Occupation: Retired  Scientific laboratory technician  . Financial resource strain: Not on file  . Food insecurity:    Worry: Not on file    Inability: Not on file  . Transportation needs:    Medical: Not on file    Non-medical: Not on file  Tobacco Use  . Smoking status: Former Smoker    Last attempt to quit: 04/01/1976    Years since quitting: 41.6  . Smokeless tobacco: Never Used  Substance and Sexual Activity  . Alcohol use: Yes    Comment: 2-3  glass wine  weekly  1-2 cans beer/ week  . Drug use: No  . Sexual activity: Never  Lifestyle  . Physical activity:    Days per week: Not on file    Minutes per session: Not on file  . Stress: Not on file    Relationships  . Social connections:    Talks on phone: Not on file    Gets together: Not on file    Attends religious service: Not on file    Active member of club or organization: Not on file    Attends meetings of clubs or organizations: Not on file    Relationship status: Not on file  Other Topics Concern  . Not on file  Social History Narrative  . Not on file    Family History  Problem Relation Age of Onset  . Deep vein thrombosis Mother   . Heart disease Father     Review of Systems  Constitutional: Negative for chills, fatigue and fever.  Eyes: Negative for visual disturbance.  Respiratory: Negative for cough, shortness of breath and wheezing.   Cardiovascular: Positive for leg swelling (minimal). Negative for chest pain and palpitations.  Gastrointestinal: Positive for abdominal pain (occ - Crohn's). Negative for anal bleeding, constipation, diarrhea and nausea.       No gerd  Genitourinary: Negative for difficulty urinating, dysuria and hematuria.  Musculoskeletal: Negative for arthralgias and back pain.  Skin: Positive for rash (recurrent rash). Negative for color change.  Neurological: Negative for light-headedness and headaches.  Psychiatric/Behavioral: Negative for dysphoric mood. The patient is not nervous/anxious.        Objective:   Vitals:   11/02/17 0825  BP: 132/60  Pulse: 84  Resp: 16  Temp: 97.8 F (36.6 C)  SpO2: 97%   Filed Weights   11/02/17 0825  Weight: 184 lb (83.5 kg)   Body mass index is 25.66 kg/m.  Wt Readings from Last 3 Encounters:  11/02/17 184 lb (83.5 kg)  10/11/17 183 lb (83 kg)  09/05/17 183 lb (83 kg)     Physical Exam Constitutional: He appears well-developed and well-nourished. No distress.  HENT:  Head: Normocephalic and atraumatic.  Right Ear: External ear normal.  Left Ear: External ear normal.  Mouth/Throat: Oropharynx is clear and moist.  Normal ear canals and TM b/l  Eyes: Conjunctivae and EOM are  normal.  Neck: Neck supple. No tracheal deviation present. No thyromegaly present.  No carotid bruit  Cardiovascular: Normal rate, regular rhythm, normal heart sounds and intact distal pulses.   No murmur heard.  Trace bilateral lower extremity edema Pulmonary/Chest: Effort normal and breath sounds normal. No respiratory distress. He has no wheezes. He has no rales.  Abdominal: Soft. He exhibits no distension. There is no tenderness.  Genitourinary: deferred  Lymphadenopathy:   He has no cervical adenopathy.  Skin: Skin is warm and dry.  He is not diaphoretic.  Psychiatric: He has a normal mood and affect. His behavior is normal.         Assessment & Plan:   Physical exam: Screening blood work  ordered Immunizations  prevnar done at his prior PCP, Pneumovax today, flu up-to-date, discussed Shingrix-he will check with his pharmacy Colonoscopy  - no longer needed due to age Eye exams  Up to date  EKG  Done 12/2015 Exercise   Doing PT for right clavicle fx, regular walking Weight   Normal BMI Skin   No concerns,  Sees derm Substance abuse   none  See Problem List for Assessment and Plan of chronic medical problems.   FU in one year

## 2017-11-01 NOTE — Patient Instructions (Addendum)
Last day of Eliquis will be February 09, 2018.   Tests ordered today. Your results will be released to [facility] (or called to you) after review, usually within 72hours after test completion. If any changes need to be made, you will be notified at that same time.  All other Health Maintenance issues reviewed.   All recommended immunizations and age-appropriate screenings are up-to-date or discussed.  Pneumovax immunizations administered today.   Medications reviewed and updated.  Changes include :   none  Your prescription(s) have been submitted to your pharmacy. Please take as directed and contact our office if you believe you are having problem(s) with the medication(s).   Please followup in one year    Health Maintenance, Male A healthy lifestyle and preventive care is important for your health and wellness. Ask your health care provider about what schedule of regular examinations is right for you. What should I know about weight and diet? Eat a Healthy Diet  Eat plenty of vegetables, fruits, whole grains, low-fat dairy products, and lean protein.  Do not eat a lot of foods high in solid fats, added sugars, or salt.  Maintain a Healthy Weight Regular exercise can help you achieve or maintain a healthy weight. You should:  Do at least 150 minutes of exercise each week. The exercise should increase your heart rate and make you sweat (moderate-intensity exercise).  Do strength-training exercises at least twice a week.  Watch Your Levels of Cholesterol and Blood Lipids  Have your blood tested for lipids and cholesterol every 5 years starting at 82 years of age. If you are at high risk for heart disease, you should start having your blood tested when you are 82 years old. You may need to have your cholesterol levels checked more often if: ? Your lipid or cholesterol levels are high. ? You are older than 82 years of age. ? You are at high risk for heart disease.  What should I know  about cancer screening? Many types of cancers can be detected early and may often be prevented. Lung Cancer  You should be screened every year for lung cancer if: ? You are a current smoker who has smoked for at least 30 years. ? You are a former smoker who has quit within the past 15 years.  Talk to your health care provider about your screening options, when you should start screening, and how often you should be screened.  Colorectal Cancer  Routine colorectal cancer screening usually begins at 82 years of age and should be repeated every 5-10 years until you are 82 years old. You may need to be screened more often if early forms of precancerous polyps or small growths are found. Your health care provider may recommend screening at an earlier age if you have risk factors for colon cancer.  Your health care provider may recommend using home test kits to check for hidden blood in the stool.  A small camera at the end of a tube can be used to examine your colon (sigmoidoscopy or colonoscopy). This checks for the earliest forms of colorectal cancer.  Prostate and Testicular Cancer  Depending on your age and overall health, your health care provider may do certain tests to screen for prostate and testicular cancer.  Talk to your health care provider about any symptoms or concerns you have about testicular or prostate cancer.  Skin Cancer  Check your skin from head to toe regularly.  Tell your health care provider about any new  moles or changes in moles, especially if: ? There is a change in a mole's size, shape, or color. ? You have a mole that is larger than a pencil eraser.  Always use sunscreen. Apply sunscreen liberally and repeat throughout the day.  Protect yourself by wearing long sleeves, pants, a wide-brimmed hat, and sunglasses when outside.  What should I know about heart disease, diabetes, and high blood pressure?  If you are 28-55 years of age, have your blood  pressure checked every 3-5 years. If you are 54 years of age or older, have your blood pressure checked every year. You should have your blood pressure measured twice-once when you are at a hospital or clinic, and once when you are not at a hospital or clinic. Record the average of the two measurements. To check your blood pressure when you are not at a hospital or clinic, you can use: ? An automated blood pressure machine at a pharmacy. ? A home blood pressure monitor.  Talk to your health care provider about your target blood pressure.  If you are between 34-70 years old, ask your health care provider if you should take aspirin to prevent heart disease.  Have regular diabetes screenings by checking your fasting blood sugar level. ? If you are at a normal weight and have a low risk for diabetes, have this test once every three years after the age of 45. ? If you are overweight and have a high risk for diabetes, consider being tested at a younger age or more often.  A one-time screening for abdominal aortic aneurysm (AAA) by ultrasound is recommended for men aged 66-75 years who are current or former smokers. What should I know about preventing infection? Hepatitis B If you have a higher risk for hepatitis B, you should be screened for this virus. Talk with your health care provider to find out if you are at risk for hepatitis B infection. Hepatitis C Blood testing is recommended for:  Everyone born from [birth year] through 1963.  Anyone with known risk factors for hepatitis C.  Sexually Transmitted Diseases (STDs)  You should be screened each year for STDs including gonorrhea and chlamydia if: ? You are sexually active and are younger than 82 years of age. ? You are older than 82 years of age and your health care provider tells you that you are at risk for this type of infection. ? Your sexual activity has changed since you were last screened and you are at an increased risk for chlamydia or  gonorrhea. Ask your health care provider if you are at risk.  Talk with your health care provider about whether you are at high risk of being infected with HIV. Your health care provider may recommend a prescription medicine to help prevent HIV infection.  What else can I do?  Schedule regular health, dental, and eye exams.  Stay current with your vaccines (immunizations).  Do not use any tobacco products, such as cigarettes, chewing tobacco, and e-cigarettes. If you need help quitting, ask your health care provider.  Limit alcohol intake to no more than 2 drinks per day. One drink equals 12 ounces of beer, 5 ounces of wine, or 1 ounces of hard liquor.  Do not use street drugs.  Do not share needles.  Ask your health care provider for help if you need support or information about quitting drugs.  Tell your health care provider if you often feel depressed.  Tell your health care provider if you  have ever been abused or do not feel safe at home. This information is not intended to replace advice given to you by your health care provider. Make sure you discuss any questions you have with your health care provider. Document Released: 06/25/2007 Document Revised: 08/26/2015 Document Reviewed: 09/30/2014 Elsevier Interactive Patient Education  [employer].

## 2017-11-02 ENCOUNTER — Encounter: Payer: Self-pay | Admitting: Internal Medicine

## 2017-11-02 ENCOUNTER — Ambulatory Visit (INDEPENDENT_AMBULATORY_CARE_PROVIDER_SITE_OTHER): Payer: PPO | Admitting: Internal Medicine

## 2017-11-02 ENCOUNTER — Other Ambulatory Visit (INDEPENDENT_AMBULATORY_CARE_PROVIDER_SITE_OTHER): Payer: PPO

## 2017-11-02 VITALS — BP 132/60 | HR 84 | Temp 97.8°F | Resp 16 | Ht 71.0 in | Wt 184.0 lb

## 2017-11-02 DIAGNOSIS — S42009A Fracture of unspecified part of unspecified clavicle, initial encounter for closed fracture: Secondary | ICD-10-CM | POA: Insufficient documentation

## 2017-11-02 DIAGNOSIS — I82402 Acute embolism and thrombosis of unspecified deep veins of left lower extremity: Secondary | ICD-10-CM

## 2017-11-02 DIAGNOSIS — D649 Anemia, unspecified: Secondary | ICD-10-CM

## 2017-11-02 DIAGNOSIS — Z0000 Encounter for general adult medical examination without abnormal findings: Secondary | ICD-10-CM

## 2017-11-02 DIAGNOSIS — S42001S Fracture of unspecified part of right clavicle, sequela: Secondary | ICD-10-CM | POA: Diagnosis not present

## 2017-11-02 DIAGNOSIS — E538 Deficiency of other specified B group vitamins: Secondary | ICD-10-CM | POA: Diagnosis not present

## 2017-11-02 DIAGNOSIS — R739 Hyperglycemia, unspecified: Secondary | ICD-10-CM

## 2017-11-02 DIAGNOSIS — L111 Transient acantholytic dermatosis [Grover]: Secondary | ICD-10-CM | POA: Insufficient documentation

## 2017-11-02 DIAGNOSIS — E559 Vitamin D deficiency, unspecified: Secondary | ICD-10-CM

## 2017-11-02 DIAGNOSIS — M8580 Other specified disorders of bone density and structure, unspecified site: Secondary | ICD-10-CM

## 2017-11-02 DIAGNOSIS — K50012 Crohn's disease of small intestine with intestinal obstruction: Secondary | ICD-10-CM | POA: Diagnosis not present

## 2017-11-02 HISTORY — DX: Fracture of unspecified part of unspecified clavicle, initial encounter for closed fracture: S42.009A

## 2017-11-02 LAB — CBC WITH DIFFERENTIAL/PLATELET
BASOS PCT: 0.8 % (ref 0.0–3.0)
Basophils Absolute: 0.1 10*3/uL (ref 0.0–0.1)
EOS ABS: 0.3 10*3/uL (ref 0.0–0.7)
EOS PCT: 3.3 % (ref 0.0–5.0)
HEMATOCRIT: 37.6 % — AB (ref 39.0–52.0)
HEMOGLOBIN: 13 g/dL (ref 13.0–17.0)
Lymphocytes Relative: 22.8 % (ref 12.0–46.0)
Lymphs Abs: 1.8 10*3/uL (ref 0.7–4.0)
MCHC: 34.5 g/dL (ref 30.0–36.0)
MCV: 92 fl (ref 78.0–100.0)
MONO ABS: 0.7 10*3/uL (ref 0.1–1.0)
MONOS PCT: 8.6 % (ref 3.0–12.0)
Neutro Abs: 5.1 10*3/uL (ref 1.4–7.7)
Neutrophils Relative %: 64.5 % (ref 43.0–77.0)
Platelets: 224 10*3/uL (ref 150.0–400.0)
RBC: 4.09 Mil/uL — AB (ref 4.22–5.81)
RDW: 13.5 % (ref 11.5–15.5)
WBC: 8 10*3/uL (ref 4.0–10.5)

## 2017-11-02 LAB — COMPREHENSIVE METABOLIC PANEL
ALT: 8 U/L (ref 0–53)
AST: 10 U/L (ref 0–37)
Albumin: 4.1 g/dL (ref 3.5–5.2)
Alkaline Phosphatase: 61 U/L (ref 39–117)
BUN: 12 mg/dL (ref 6–23)
CHLORIDE: 103 meq/L (ref 96–112)
CO2: 29 mEq/L (ref 19–32)
Calcium: 9.4 mg/dL (ref 8.4–10.5)
Creatinine, Ser: 1.05 mg/dL (ref 0.40–1.50)
GFR: 71.56 mL/min (ref >60.00)
GLUCOSE: 90 mg/dL (ref 70–99)
POTASSIUM: 3.8 meq/L (ref 3.5–5.1)
SODIUM: 140 meq/L (ref 135–145)
Total Bilirubin: 0.8 mg/dL (ref 0.2–1.2)
Total Protein: 6.9 g/dL (ref 6.0–8.3)

## 2017-11-02 LAB — VITAMIN D 25 HYDROXY (VIT D DEFICIENCY, FRACTURES): VITD: 38.94 ng/mL (ref 30.00–100.00)

## 2017-11-02 LAB — LIPID PANEL
CHOL/HDL RATIO: 3
Cholesterol: 97 mg/dL (ref 0–200)
HDL: 31.9 mg/dL — AB (ref >39.00)
NONHDL: 65.51
Triglycerides: 223 mg/dL — ABNORMAL HIGH (ref 0.0–149.0)
VLDL: 44.6 mg/dL — AB (ref 0.0–40.0)

## 2017-11-02 LAB — FERRITIN: Ferritin: 116.6 ng/mL (ref 22.0–322.0)

## 2017-11-02 LAB — TSH: TSH: 1.75 u[IU]/mL (ref 0.35–4.50)

## 2017-11-02 LAB — IRON: Iron: 85 ug/dL (ref 42–165)

## 2017-11-02 LAB — VITAMIN B12: VITAMIN B 12: 360 pg/mL (ref 211–911)

## 2017-11-02 LAB — LDL CHOLESTEROL, DIRECT: LDL DIRECT: 47 mg/dL

## 2017-11-02 LAB — HEMOGLOBIN A1C: Hgb A1c MFr Bld: 5.3 % (ref 4.6–6.5)

## 2017-11-02 NOTE — Assessment & Plan Note (Signed)
Check a1c Exercising regularly Low sugar/ carb diet

## 2017-11-02 NOTE — Assessment & Plan Note (Signed)
Had DEXA through endocrine in 2018 Will recheck DEXA 06/2018 Continue calcium and vitamin D Continue regular exercise

## 2017-11-02 NOTE — Assessment & Plan Note (Signed)
Taking vitamin D daily Check level

## 2017-11-02 NOTE — Assessment & Plan Note (Signed)
Getting B12 injections monthly Check level

## 2017-11-02 NOTE — Assessment & Plan Note (Signed)
History of anemia-related to Crohn's and iron deficiency Check CBC, iron, ferritin

## 2017-11-02 NOTE — Assessment & Plan Note (Addendum)
Following with GI Currently no symptoms Will check iron levels, B12, vitamin D

## 2017-11-03 ENCOUNTER — Telehealth: Payer: Self-pay | Admitting: Internal Medicine

## 2017-11-03 ENCOUNTER — Ambulatory Visit (INDEPENDENT_AMBULATORY_CARE_PROVIDER_SITE_OTHER): Payer: PPO

## 2017-11-03 ENCOUNTER — Ambulatory Visit (INDEPENDENT_AMBULATORY_CARE_PROVIDER_SITE_OTHER): Payer: PPO | Admitting: Orthopaedic Surgery

## 2017-11-03 DIAGNOSIS — S42024A Nondisplaced fracture of shaft of right clavicle, initial encounter for closed fracture: Secondary | ICD-10-CM | POA: Diagnosis not present

## 2017-11-03 NOTE — Telephone Encounter (Unsigned)
Copied from [place] [id number]. Topic: General - Other >> Nov 03, 2017 11:36 AM Yvette Rack wrote: Reason for CRM: Pt returned call for lab results. Pt requests call back.

## 2017-11-03 NOTE — Progress Notes (Signed)
      Patient: Michael [name]           Date of Birth: 13-Aug-1932           MRN: [id number] Visit Date: 11/03/2017 PCP: Binnie Rail, MD   Assessment & Plan:  Chief Complaint:  Chief Complaint  Patient presents with  . Right Shoulder - Follow-up, Fracture    Closed nondisplaced clavicle fracture- 08/13/17 fall   Visit Diagnoses:  1. Nondisplaced fracture of shaft of right clavicle, initial encounter for closed fracture     Plan: Patient is approximately 3 months status post mildly displaced right distal clavicle fracture.  He presents today for follow-up.  He is doing very well and reports no pain.  He continues to work on home exercises for strengthening and range of motion.  He has no tenderness to palpation of the fracture site.  His range of motion is near normal at this point.  His fracture has demonstrated complete healing.  From my standpoint he is doing very well and he is released to full activity as tolerated.  Follow-up as needed.  Follow-Up Instructions: Return if symptoms worsen or fail to improve.   Orders:  Orders Placed This Encounter  Procedures  . XR Clavicle Right   No orders of the defined types were placed in this encounter.   Imaging: Xr Clavicle Right  Result Date: 11/03/2017 Healed distal clavicle fracture   PMFS History: Patient Active Problem List   Diagnosis Date Noted  . Hyperglycemia 11/02/2017  . 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 Clavicle fracture 11/02/2017  . Vitamin D deficiency 11/02/2017  . Leg DVT (deep venous thromboembolism), acute, left ([place]) 08/08/2017  . SBO (small bowel obstruction) ([place]) 07/05/2017  . Bilateral hearing loss 05/31/2017  . B12 deficiency 02/06/2017  . Osteopenia 02/06/2017  . Glaucoma 12/12/2015  . Crohn's disease ([place]) 12/12/2015  . Anemia 12/12/2015  . Retinal ischemia 12/06/2011   Past Medical History:  Diagnosis Date  . Adrenal insufficiency ([place])   . Anemia   . Avascular necrosis ([place])   .  Crohn's disease ([place])   . Glaucoma   . Inguinal hernia recurrent bilateral   . Low testosterone   . Osteopenia   . Retinal ischemia     Family History  Problem Relation Age of Onset  . Deep vein thrombosis Mother   . Heart disease Father     Past Surgical History:  Procedure Laterality Date  . ABDOMINAL SURGERY    . COLONOSCOPY N/A 12/13/2015   Procedure: COLONOSCOPY;  Surgeon: Laurence Spates, MD;  Location: [facility];  Service: Endoscopy;  Laterality: N/A;  . INGUINAL HERNIA REPAIR Bilateral 05/21/2014   Procedure: OPEN BILATERAL INGUINAL HERNIA REPAIRS WITH MESH;  Surgeon: Donnie Mesa, MD;  Location: [facility];  Service: General;  Laterality: Bilateral;  . SMALL INTESTINE SURGERY  1968, 2001    related to Chrohn's disease  . TONSILLECTOMY     Social History   Occupational History  . Occupation: Retired  Tobacco Use  . Smoking status: Former Smoker    Last attempt to quit: 04/01/1976    Years since quitting: 41.6  . Smokeless tobacco: Never Used  Substance and Sexual Activity  . Alcohol use: Yes    Comment: 2-3  glass wine  weekly  1-2 cans beer/ week  . Drug use: No  . Sexual activity: Never

## 2017-11-03 NOTE — Telephone Encounter (Signed)
Pt's wife given lab results and documented in result note

## 2017-11-15 ENCOUNTER — Other Ambulatory Visit: Payer: Self-pay | Admitting: Internal Medicine

## 2017-11-15 ENCOUNTER — Encounter: Payer: Self-pay | Admitting: Internal Medicine

## 2017-11-15 MED ORDER — CYANOCOBALAMIN 1000 MCG/ML IJ SOLN: 1000.0000 ug | INTRAMUSCULAR | 0 refills | Status: DC

## 2017-11-15 NOTE — Telephone Encounter (Signed)
Refill request for B-12 IJ.    This medication not prescribed by Dr. Quay Burow.   Prescribed by a historical provider.   When I try to pend it I get a message about this is for documentation purposes only.  Not sure he is on this medication.   Thanks.

## 2017-11-15 NOTE — Addendum Note (Signed)
Addended by: Terence Lux B on: 11/15/2017 04:18 PM   Modules accepted: Orders

## 2017-11-15 NOTE — Telephone Encounter (Signed)
Ok to refill 

## 2017-11-15 NOTE — Telephone Encounter (Signed)
Spoke with pt and he advised he was taking his B12 monthly and only has 1 vial left. Ok to refill?

## 2017-11-15 NOTE — Telephone Encounter (Signed)
Copied from [place] [phone]. Topic: Quick Communication - Rx Refill/Question >> Nov 15, 2017  1:39 PM Burchel, Abbi R wrote: Medication: Cyanocobalamin (VITAMIN B-12 IJ)  Preferred Pharmacy: [facility] # [id number], [place] [address] [place] [address] Phone: [phone] Fax: [phone]    Pt was advised that RX refills may take up to 3 business days. We ask that you follow-up with your pharmacy.

## 2017-12-14 NOTE — Progress Notes (Signed)
Subjective:    Patient ID: Michael [name], male    DOB: 1932-07-20, 84 y.o.   MRN: [id number]  HPI The patient is here for an acute visit.  Spot on the top of his foot:  He noticed an area on the top of his left foot that he was concerned about.  It is more prominent and at times the ares is sensitive - especially the sheet on it at night.  He was concerned because this the leg that he has the blood clot in.  The swelling in the leg is controlled.  He is not wearing the compression socks.   Medications and allergies reviewed with patient and updated if appropriate.  Patient Active Problem List   Diagnosis Date Noted  . Hyperglycemia 11/02/2017  . 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 Clavicle fracture 11/02/2017  . Vitamin D deficiency 11/02/2017  . Leg DVT (deep venous thromboembolism), acute, left ([place]) 08/08/2017  . SBO (small bowel obstruction) ([place]) 07/05/2017  . Bilateral hearing loss 05/31/2017  . B12 deficiency 02/06/2017  . Osteopenia 02/06/2017  . Glaucoma 12/12/2015  . Crohn's disease ([place]) 12/12/2015  . Anemia 12/12/2015  . 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 apixaban (ELIQUIS) 5 MG TABS tablet Take 1 tablet (5 mg total) by mouth 2 (two) times daily. 60 tablet 4  . augmented betamethasone dipropionate (DIPROLENE-AF) 0.05 % cream     . brimonidine (ALPHAGAN) 0.2 % ophthalmic solution Place 1 drop into both eyes 2 (two) times daily.    . budesonide (ENTOCORT EC) 3 MG 24 hr capsule Take 3 capsules (9 mg total) by mouth daily. For 2 weeks. Please call Dr. Havery Moros when you need to start this medication: [phone] 42 capsule 0  . Calcium Carbonate-Vit D-Min (CALCIUM 1200 PO) Take by mouth daily.    . Cholecalciferol (VITAMIN D) 2000 units CAPS Take 1 capsule (2,000 Units total) by mouth daily. 30 capsule   . cyanocobalamin (,VITAMIN B-12,) 1000 MCG/ML injection Inject 1 mL (1,000 mcg  total) into the muscle every 30 (thirty) days. 10 mL 0  . Cyanocobalamin (VITAMIN B-12 IJ) Inject 1 mL as directed every 30 (thirty) days.     . dorzolamide-timolol (COSOPT) 22.3-6.8 MG/ML ophthalmic solution Place 1 drop into both eyes 2 (two) times daily.    . IRON PO Take 325 mg by mouth 3 (three) times a week.     . latanoprost (XALATAN) 0.005 % ophthalmic solution     . Netarsudil Dimesylate (RHOPRESSA) 0.02 % SOLN Apply to eye.    . NON FORMULARY Anti Diarrhea    . omeprazole (PRILOSEC) 20 MG capsule Take 20 mg by mouth daily. Before breakfast     No current facility-administered medications on file prior to visit.     Past Medical History:  Diagnosis Date  . Adrenal insufficiency ([place])   . Anemia   . Avascular necrosis ([place])   . Crohn's disease ([place])   . Glaucoma   . Inguinal hernia recurrent bilateral   . Low testosterone   . Osteopenia   . Retinal ischemia     Past Surgical History:  Procedure Laterality Date  . ABDOMINAL SURGERY    . COLONOSCOPY N/A 12/13/2015   Procedure: COLONOSCOPY;  Surgeon: Laurence Spates, MD;  Location: [facility];  Service: Endoscopy;  Laterality: N/A;  . INGUINAL HERNIA REPAIR Bilateral 05/21/2014   Procedure: OPEN BILATERAL INGUINAL HERNIA REPAIRS WITH MESH;  Surgeon: Donnie Mesa, MD;  Location: [place] [facility] [place] [facility];  Service: General;  Laterality: Bilateral;  . SMALL INTESTINE SURGERY  1968, 2001    related to Chrohn's disease  . TONSILLECTOMY      Social History   Socioeconomic History  . Marital status: Married    Spouse name: Not on file  . Number of children: 3  . Years of education: Not on file  . Highest education level: Not on file  Occupational History  . Occupation: Retired  Scientific laboratory technician  . Financial resource strain: Not on file  . Food insecurity:    Worry: Not on file    Inability: Not on file  . Transportation needs:    Medical: Not on file    Non-medical: Not on file  Tobacco Use  . Smoking status: Former  Smoker    Last attempt to quit: 04/01/1976    Years since quitting: 41.7  . Smokeless tobacco: Never Used  Substance and Sexual Activity  . Alcohol use: Yes    Comment: 2-3  glass wine  weekly  1-2 cans beer/ week  . Drug use: No  . Sexual activity: Never  Lifestyle  . Physical activity:    Days per week: Not on file    Minutes per session: Not on file  . Stress: Not on file  Relationships  . Social connections:    Talks on phone: Not on file    Gets together: Not on file    Attends religious service: Not on file    Active member of club or organization: Not on file    Attends meetings of clubs or organizations: Not on file    Relationship status: Not on file  Other Topics Concern  . Not on file  Social History Narrative  . Not on file    Family History  Problem Relation Age of Onset  . Deep vein thrombosis Mother   . Heart disease Father     Review of Systems  Constitutional: Negative for chills and fever.  Cardiovascular: Positive for leg swelling (mild left ankle).  Skin: Positive for color change. Negative for wound.  Neurological: Negative for numbness.       Hypersensitivity left dorsal foot near 2-3rd toes       Objective:   Vitals:   12/15/17 1423  BP: 128/68  Pulse: 69  Resp: 16  Temp: 97.9 F (36.6 C)  SpO2: 97%   BP Readings from Last 3 Encounters:  12/15/17 128/68  11/02/17 132/60  10/11/17 100/62   Wt Readings from Last 3 Encounters:  12/15/17 186 lb (84.4 kg)  11/02/17 184 lb (83.5 kg)  10/11/17 183 lb (83 kg)   Body mass index is 25.94 kg/m.   Physical Exam  Constitutional: He appears well-developed and well-nourished. No distress.  HENT:  Head: Normocephalic and atraumatic.  Cardiovascular:  Normal left foot DP, posterior tibial pulses  Musculoskeletal: He exhibits edema (trace left ankle).  Neurological: No sensory deficit (left foot).  Skin: Skin is warm and dry. He is not diaphoretic.  Cluster of capillaries at distal left  foot/base of 2-3rd toes - nontender, normal appearing;  Several varicose veins of varying sizes in left lower leg and food           Assessment & Plan:    See Problem List for Assessment and Plan of chronic medical problems.

## 2017-12-15 ENCOUNTER — Ambulatory Visit (INDEPENDENT_AMBULATORY_CARE_PROVIDER_SITE_OTHER): Payer: PPO | Admitting: Internal Medicine

## 2017-12-15 ENCOUNTER — Encounter: Payer: Self-pay | Admitting: Internal Medicine

## 2017-12-15 VITALS — BP 128/68 | HR 69 | Temp 97.9°F | Resp 16 | Ht 71.0 in | Wt 186.0 lb

## 2017-12-15 DIAGNOSIS — I8392 Asymptomatic varicose veins of left lower extremity: Secondary | ICD-10-CM | POA: Diagnosis not present

## 2017-12-15 NOTE — Patient Instructions (Signed)
The spot on your foot is a cluster of capillaries.  These are just tiny blood vessels.  This is not concerning.   You can monitor this, but it should not cause any problems.

## 2017-12-16 DIAGNOSIS — I8392 Asymptomatic varicose veins of left lower extremity: Secondary | ICD-10-CM | POA: Insufficient documentation

## 2017-12-16 NOTE — Assessment & Plan Note (Signed)
Several areas of varicose veins in LLE - area of concern was a cluster of capillaries  Asymptomatic - areas slightly hypersensitive, which is likely not related to varicosity reassured He will monitor  Advised wearing compression socks

## 2018-01-11 DIAGNOSIS — L821 Other seborrheic keratosis: Secondary | ICD-10-CM | POA: Diagnosis not present

## 2018-01-11 DIAGNOSIS — L281 Prurigo nodularis: Secondary | ICD-10-CM | POA: Diagnosis not present

## 2018-01-11 DIAGNOSIS — Z85828 Personal history of other malignant neoplasm of skin: Secondary | ICD-10-CM | POA: Diagnosis not present

## 2018-01-11 DIAGNOSIS — L858 Other specified epidermal thickening: Secondary | ICD-10-CM | POA: Diagnosis not present

## 2018-02-09 DIAGNOSIS — M79672 Pain in left foot: Secondary | ICD-10-CM | POA: Diagnosis not present

## 2018-02-12 ENCOUNTER — Telehealth: Payer: Self-pay

## 2018-02-12 DIAGNOSIS — I82402 Acute embolism and thrombosis of unspecified deep veins of left lower extremity: Secondary | ICD-10-CM

## 2018-02-12 NOTE — Telephone Encounter (Signed)
LVM for pt to call back in regards.  

## 2018-02-12 NOTE — Telephone Encounter (Signed)
Copied from [place] [id number]. Topic: General - Inquiry >> Feb 12, 2018  3:02 PM Scherrie Gerlach wrote: Reason for CRM: pt states he took his last dose of apixaban (ELIQUIS) 5 MG TABS tablet (6 months) He would like to know if he needs to do anything else now

## 2018-02-12 NOTE — Telephone Encounter (Signed)
Pt informed, had no other questions

## 2018-02-12 NOTE — Telephone Encounter (Signed)
No, nothing further needed.  He completed a 6 months.  Medication taken off of medication list.

## 2018-02-20 DIAGNOSIS — H401133 Primary open-angle glaucoma, bilateral, severe stage: Secondary | ICD-10-CM | POA: Diagnosis not present

## 2018-02-24 IMAGING — CT CT ABD-PELV W/ CM
2 of 6 series · 15 of 46 positions shown, 17 images · IV contrast (ISOVUE)
Comparison: Radiographs obtained yesterday

CLINICAL DATA: Rectal pain for the past 4 days. Clinical concern
for ileus or fecal impaction. Bright red blood per rectum. Recurrent
bilateral inguinal hernias. Crohn's disease.

EXAM:
CT ABDOMEN AND PELVIS WITH CONTRAST
TECHNIQUE: Multidetector CT imaging of the abdomen and pelvis was performed
using the standard protocol following bolus administration of
intravenous contrast.
CONTRAST:  100mL Y5MFUP-0GG IOPAMIDOL (Y5MFUP-0GG) INJECTION 61%

[Series 2: abd/pel with · axial · 0.79mm/px · z∈[-502,-112]mm · 12 of 94 slices shown, 14 images]
[im 8/94  soft-tissue]
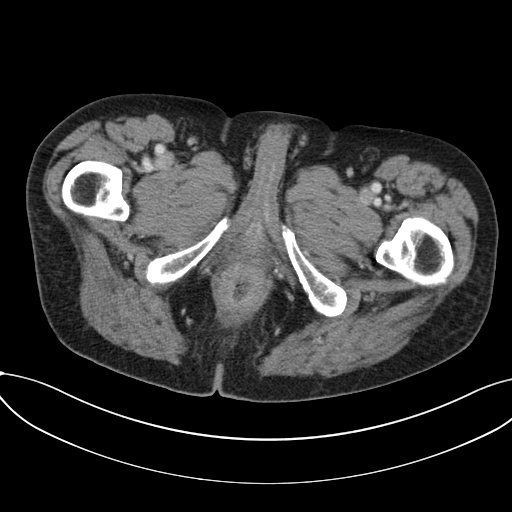
[im 8/94  bone]
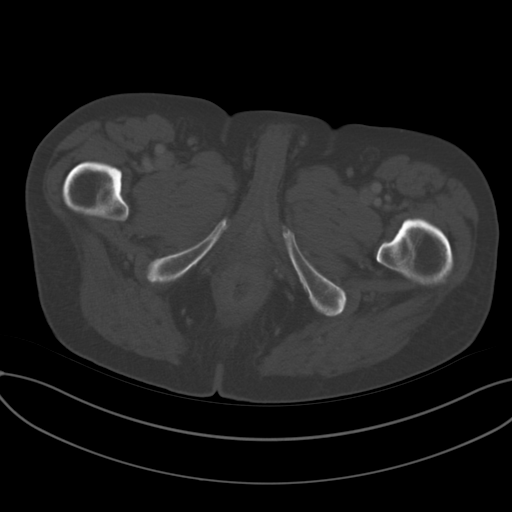
[im 15/94  soft-tissue]
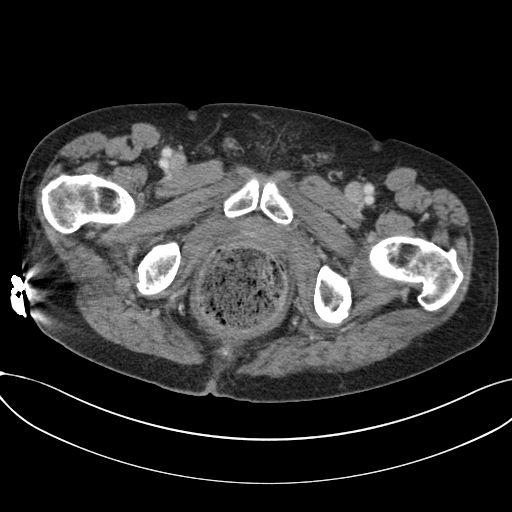
[im 22/94  soft-tissue]
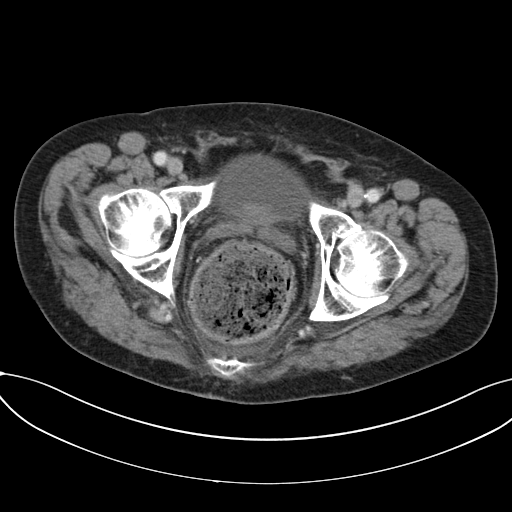
[im 29/94  soft-tissue]
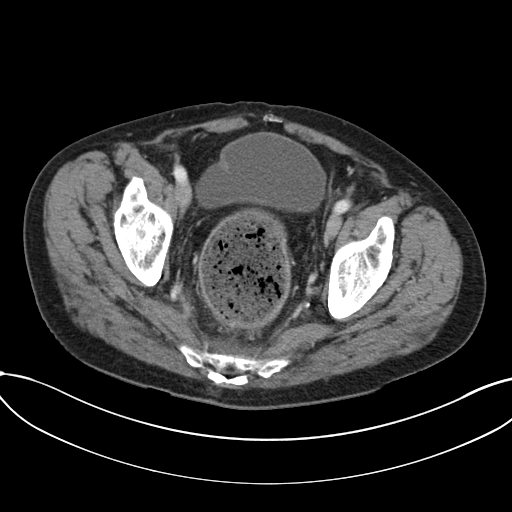
[im 36/94  soft-tissue]
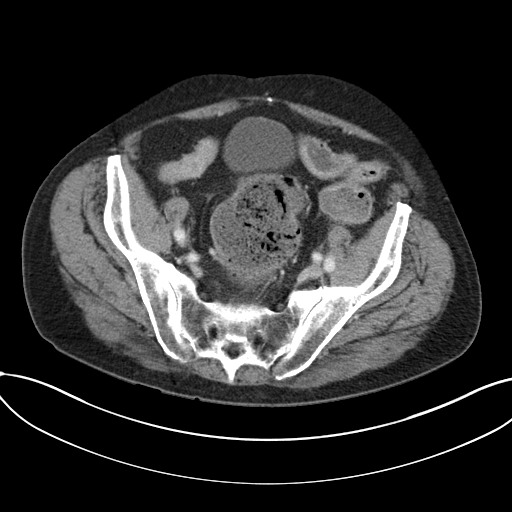
[im 43/94  soft-tissue]
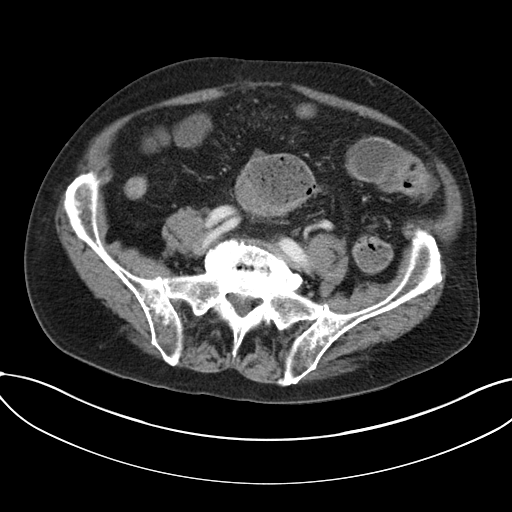
[im 51/94  soft-tissue]
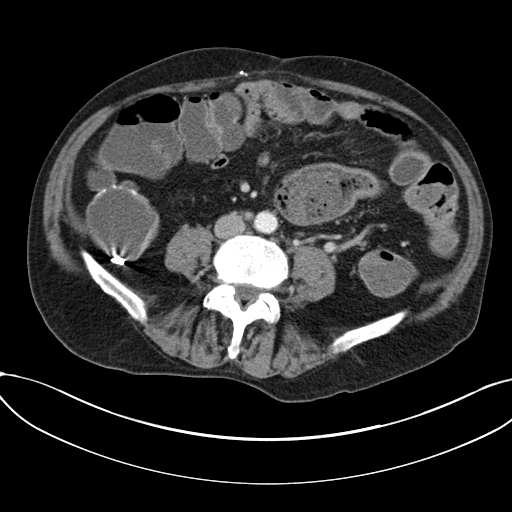
[im 58/94  soft-tissue]
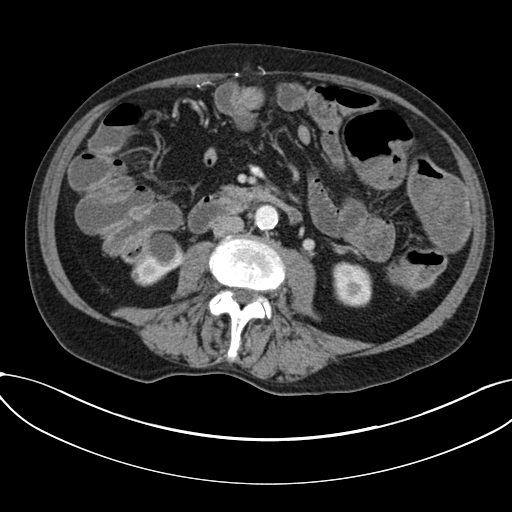
[im 65/94  soft-tissue]
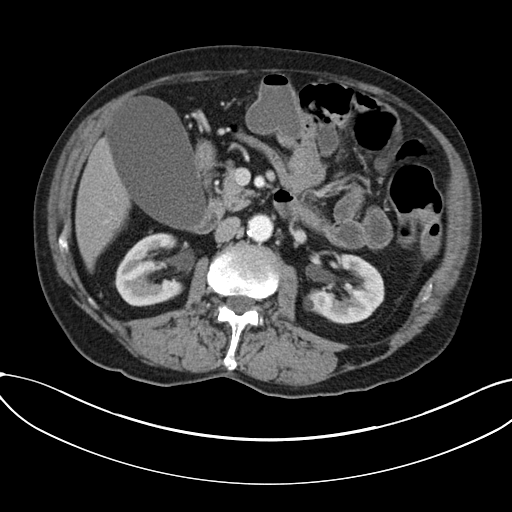
[im 65/94  bone]
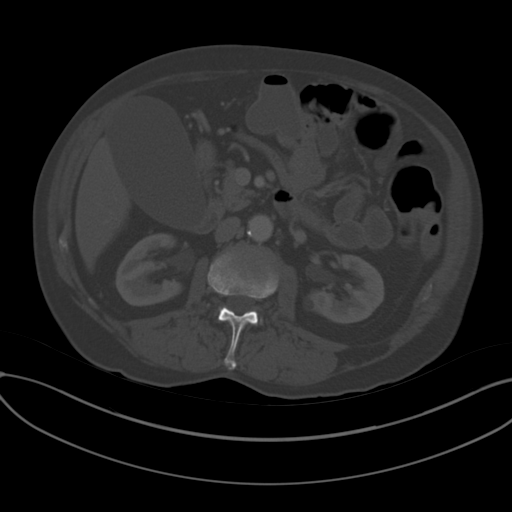
[im 72/94  soft-tissue]
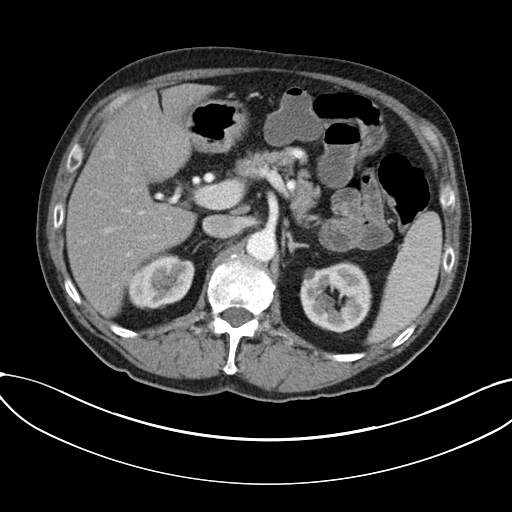
[im 79/94  soft-tissue]
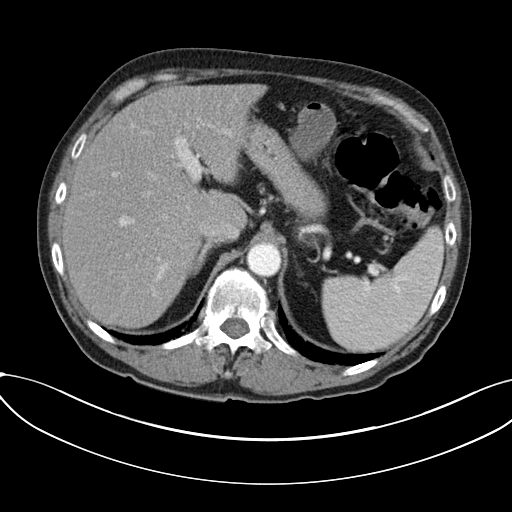
[im 86/94  soft-tissue]
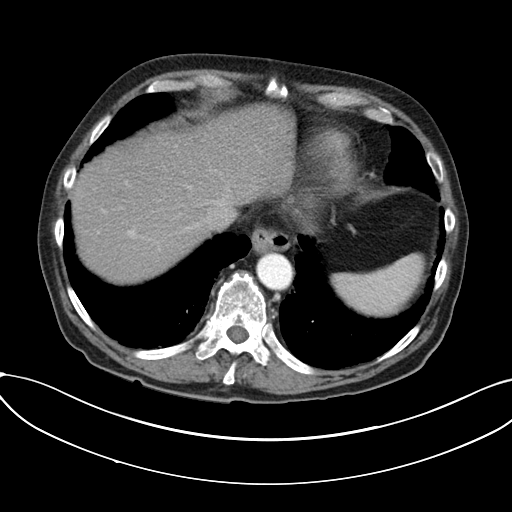

[Series 6: coronal a/|p · coronal · 0.81mm/px · 3 of 142 slices shown]
[im 48/142  soft-tissue]
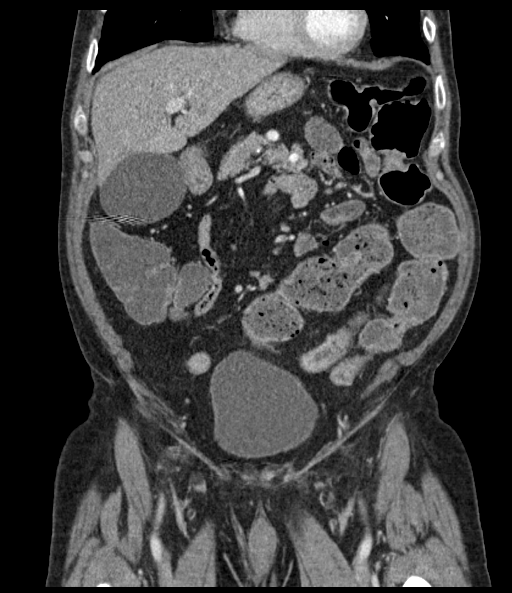
[im 63/142  soft-tissue]
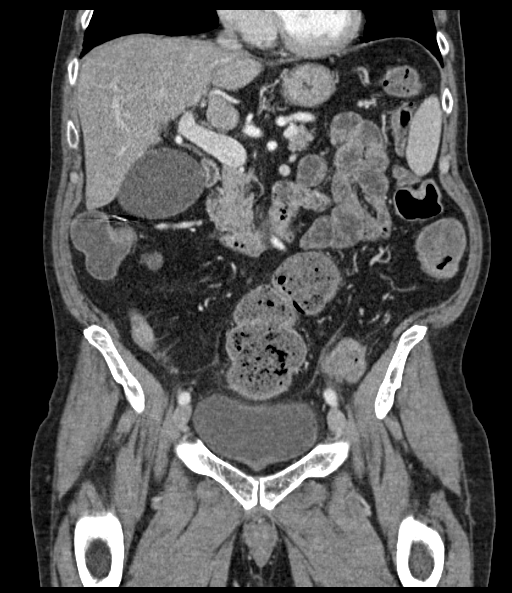
[im 79/142  soft-tissue]
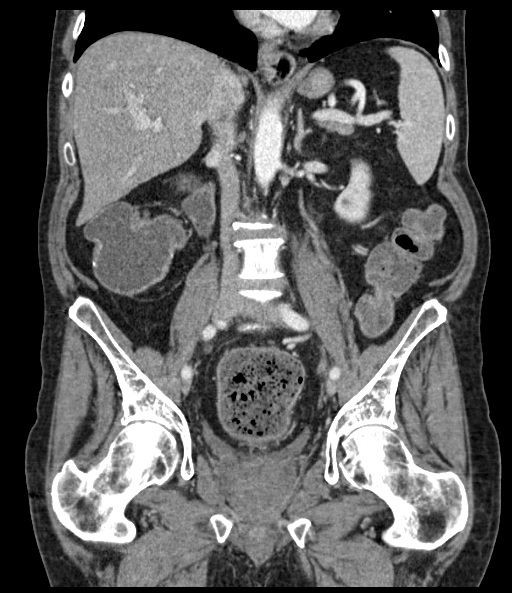

[15 of 46 positions shown; findings below may reference images not displayed]

FINDINGS: Lower chest: Minimal bilateral dependent atelectasis.

Hepatobiliary: Mild diffuse low density of the liver relative to the
spleen. Distended, normal appearing gallbladder.

Pancreas: Unremarkable. No pancreatic ductal dilatation or
surrounding inflammatory changes.

Spleen: Normal in size without focal abnormality.

Adrenals/Urinary Tract: Multiple bilateral renal cysts. Normal
appearing adrenal glands, ureters and urinary bladder. No urinary
tract calculi or hydronephrosis.

Stomach/Bowel: Small sliding hiatal hernia. Large amount of stool
distending the rectum and distal sigmoid colon with mild perirectal
edema. Small bowel to right colon anastomosis. Mildly prominent
proximal small bowel loops without abnormal dilatation. Mild
diffuse, enhancing wall thickening involving a pelvic loop of ileum,
for example image number 54 of series 2.

Vascular/Lymphatic: Mild atheromatous arterial calcifications
without aneurysm. No enlarged lymph nodes.

Reproductive: No significant prostatic enlargement.

Other: Small left inguinal hernia containing fat. Bilateral
hydroceles, larger on the right.

Musculoskeletal: Small area of avascular necrosis in the right
femoral head. Lumbar and lower thoracic spine degenerative changes.
Approximately 30% T12 superior endplate compression deformity
without significant change. No acute fracture lines. Minimal bony
retropulsion.
IMPRESSION: 1. Continued mild diffuse wall thickening and enhancement involving
a pelvic loop of ileum, compatible with stable changes of Crohn's
disease.
2. Large amount of stool distending the rectum and distal sigmoid
colon with mild perirectal edema.
3. Mild diffuse hepatic steatosis.
4. Bilateral hydroceles, larger on the right.
5. Small area of avascular necrosis in the right femoral head.
6. Stable distended gallbladder without evidence cholecystitis.

## 2018-02-28 DIAGNOSIS — M792 Neuralgia and neuritis, unspecified: Secondary | ICD-10-CM | POA: Diagnosis not present

## 2018-02-28 DIAGNOSIS — M79672 Pain in left foot: Secondary | ICD-10-CM | POA: Diagnosis not present

## 2018-03-14 DIAGNOSIS — H00021 Hordeolum internum right upper eyelid: Secondary | ICD-10-CM | POA: Diagnosis not present

## 2018-03-22 DIAGNOSIS — L309 Dermatitis, unspecified: Secondary | ICD-10-CM | POA: Diagnosis not present

## 2018-03-22 DIAGNOSIS — L111 Transient acantholytic dermatosis [Grover]: Secondary | ICD-10-CM | POA: Diagnosis not present

## 2018-03-22 DIAGNOSIS — Z85828 Personal history of other malignant neoplasm of skin: Secondary | ICD-10-CM | POA: Diagnosis not present

## 2018-03-22 DIAGNOSIS — L84 Corns and callosities: Secondary | ICD-10-CM | POA: Diagnosis not present

## 2018-04-24 ENCOUNTER — Telehealth: Payer: Self-pay

## 2018-04-24 NOTE — Telephone Encounter (Signed)
Noted in chart.

## 2018-04-24 NOTE — Telephone Encounter (Signed)
Copied from [place] [id number]. Topic: General - Inquiry >> Apr 24, 2018 10:17 AM Scherrie Gerlach wrote: Reason for CRM: pt wants to know if he should wait on his PNA vaccine or can he get now? >> Apr 24, 2018 10:40 AM Ahmed Prima L wrote: Patient called back and said he contacted [facility] and her number is [phone]. He was advised by Kennyth Lose that he had a pneumonia vaccine with them in 2016 for the booster and number 23 was in 2005. He said to you can call her to verify at [facility].  >> Apr 24, 2018 11:03 AM Morey Hummingbird wrote: Please notate in chart

## 2018-05-15 ENCOUNTER — Encounter: Payer: Self-pay | Admitting: Gastroenterology

## 2018-06-13 NOTE — Progress Notes (Deleted)
Subjective:   Michael [name] is a 85 y.o. male who presents for an Initial Medicare Annual Wellness Visit. I connected with patient by a telephone and verified that I am speaking with the correct person using two identifiers. Patient stated full name and DOB. Patient gave permission to continue with telephonic visit. Patient's location was at home and Nurse's location was at [place] office.   Review of Systems  No ROS.  Medicare Wellness Virtual Visit.  Visual/audio telehealth visit, UTA vital signs.   See social history for additional risk factors.   Sleep patterns: {SX; SLEEP PATTERNS:18802::"feels rested on waking","does not get up to void","gets up *** times nightly to void","sleeps *** hours nightly"}.    Home Safety/Smoke Alarms: Feels safe in home. Smoke alarms in place.  Living environment; residence and Firearm Safety: {Rehab home environment / accessibility:30080::"no firearms","firearms stored safely"}. Seat Belt Safety/Bike Helmet: Wears seat belt.     Objective:    There were no vitals filed for this visit. There is no height or weight on file to calculate BMI.  Advanced Directives 08/13/2017 08/07/2017 07/05/2017 07/05/2017 05/17/2016 05/16/2016 12/12/2015  Does Patient Have a Medical Advance Directive? Yes Yes Yes Yes Yes Yes Yes  Type of Advance Directive - [place];Living will [place];Living will [place];Living will [place];Living will [place];Living will [place]  Does patient want to make changes to medical advance directive? - - No - Patient declined - - - Yes (Inpatient - patient defers changing a medical advance directive at this time)  Copy of [place] in Chart? - - No - copy requested - No - copy requested - No - copy requested  Would patient like information on creating a medical advance directive? - No - Patient declined - - -  - -    Current Medications (verified) Outpatient Encounter Medications as of 06/15/2018  Medication Sig  . augmented betamethasone dipropionate (DIPROLENE-AF) 0.05 % cream   . brimonidine (ALPHAGAN) 0.2 % ophthalmic solution Place 1 drop into both eyes 2 (two) times daily.  . budesonide (ENTOCORT EC) 3 MG 24 hr capsule Take 3 capsules (9 mg total) by mouth daily. For 2 weeks. Please call Dr. Havery Moros when you need to start this medication: [phone]  . Calcium Carbonate-Vit D-Min (CALCIUM 1200 PO) Take by mouth daily.  . Cholecalciferol (VITAMIN D) 2000 units CAPS Take 1 capsule (2,000 Units total) by mouth daily.  . cyanocobalamin (,VITAMIN B-12,) 1000 MCG/ML injection Inject 1 mL (1,000 mcg total) into the muscle every 30 (thirty) days.  . Cyanocobalamin (VITAMIN B-12 IJ) Inject 1 mL as directed every 30 (thirty) days.   . dorzolamide-timolol (COSOPT) 22.3-6.8 MG/ML ophthalmic solution Place 1 drop into both eyes 2 (two) times daily.  . IRON PO Take 325 mg by mouth 3 (three) times a week.   . latanoprost (XALATAN) 0.005 % ophthalmic solution   . Netarsudil Dimesylate (RHOPRESSA) 0.02 % SOLN Apply to eye.  . NON FORMULARY Anti Diarrhea  . omeprazole (PRILOSEC) 20 MG capsule Take 20 mg by mouth daily. Before breakfast   No facility-administered encounter medications on file as of 06/15/2018.     Allergies (verified) Patient has no known allergies.   History: Past Medical History:  Diagnosis Date  . Adrenal insufficiency ([place])   . Anemia   . Avascular necrosis ([place])   . Crohn's disease ([place])   . Glaucoma   . Inguinal  hernia recurrent bilateral   . Low testosterone   . Osteopenia   . Retinal ischemia    Past Surgical History:  Procedure Laterality Date  . ABDOMINAL SURGERY    . COLONOSCOPY N/A 12/13/2015   Procedure: COLONOSCOPY;  Surgeon: Laurence Spates, MD;  Location[facility];  Service: Endoscopy;  Laterality: N/A;  . INGUINAL HERNIA REPAIR Bilateral 05/21/2014    Procedure: OPEN BILATERAL INGUINAL HERNIA REPAIRS WITH MESH;  Surgeon: Donnie Mesa, MD;  Location: [place];  Service: General;  Laterality: Bilateral;  . SMALL INTESTINE SURGERY  1968, 2001    related to Chrohn's disease  . TONSILLECTOMY     Family History  Problem Relation Age of Onset  . Deep vein thrombosis Mother   . Heart disease Father    Social History   Socioeconomic History  . Marital status: Married    Spouse name: Not on file  . Number of children: 3  . Years of education: Not on file  . Highest education level: Not on file  Occupational History  . Occupation: Retired  Scientific laboratory technician  . Financial resource strain: Not on file  . Food insecurity:    Worry: Not on file    Inability: Not on file  . Transportation needs:    Medical: Not on file    Non-medical: Not on file  Tobacco Use  . Smoking status: Former Smoker    Last attempt to quit: 04/01/1976    Years since quitting: 42.2  . Smokeless tobacco: Never Used  Substance and Sexual Activity  . Alcohol use: Yes    Comment: 2-3  glass Nery Kalisz  weekly  1-2 cans beer/ week  . Drug use: No  . Sexual activity: Never  Lifestyle  . Physical activity:    Days per week: Not on file    Minutes per session: Not on file  . Stress: Not on file  Relationships  . Social connections:    Talks on phone: Not on file    Gets together: Not on file    Attends religious service: Not on file    Active member of club or organization: Not on file    Attends meetings of clubs or organizations: Not on file    Relationship status: Not on file  Other Topics Concern  . Not on file  Social History Narrative  . Not on file   Tobacco Counseling Counseling given: Not Answered   Activities of Daily Living In your present state of health, do you have any difficulty performing the following activities: 07/05/2017  Hearing? N  Vision? N  Difficulty concentrating or making decisions? N  Walking or climbing stairs? N   Dressing or bathing? N  Doing errands, shopping? N  Some recent data might be hidden     Immunizations and Health Maintenance Immunization History  Administered Date(s) Administered  . Influenza, High Dose Seasonal PF 10/25/2017  . Influenza-Unspecified 10/26/2016  . Pneumococcal Conjugate-13 01/10/2014  . Pneumococcal Polysaccharide-23 01/11/2003  . Tdap 08/13/2017  . Zoster 09/28/2016  . Zoster Recombinat (Shingrix) 11/28/2016   There are no preventive care reminders to display for this patient.  Patient Care Team: Binnie Rail, MD as PCP - General (Internal Medicine)  Indicate any recent Medical Services you may have received from other than [facility] providers in the past year (date may be approximate).    Assessment:   This is a routine wellness examination for [facility]. Physical assessment deferred to PCP.  Hearing/Vision screen No exam  data present  Dietary issues and exercise activities discussed:   Diet (meal preparation, eat out, water intake, caffeinated beverages, dairy products, fruits and vegetables): {Desc; diets:16563}   Goals   None    Depression Screen PHQ 2/9 Scores 02/06/2017  PHQ - 2 Score 0    Fall Risk Fall Risk  02/06/2017  Falls in the past year? No   Cognitive Function:        Screening Tests Health Maintenance  Topic Date Due  . INFLUENZA VACCINE  08/11/2018  . TETANUS/TDAP  08/14/2027  . PNA vac Low Risk Adult  Completed      Plan:     I have personally reviewed and noted the following in the patient's chart:   . Medical and social history . Use of alcohol, tobacco or illicit drugs  . Current medications and supplements . Functional ability and status . Nutritional status . Physical activity . Advanced directives . List of other physicians . Screenings to include cognitive, depression, and falls . Referrals and appointments  In addition, I have reviewed and discussed with patient certain preventive protocols, quality  metrics, and best practice recommendations. A written personalized care plan for preventive services as well as general preventive health recommendations were provided to patient.     Michiel Cowboy, RN   06/13/2018

## 2018-06-15 ENCOUNTER — Ambulatory Visit: Payer: PPO

## 2018-06-21 DIAGNOSIS — H401133 Primary open-angle glaucoma, bilateral, severe stage: Secondary | ICD-10-CM | POA: Diagnosis not present

## 2018-06-21 DIAGNOSIS — Z961 Presence of intraocular lens: Secondary | ICD-10-CM | POA: Diagnosis not present

## 2018-09-25 DIAGNOSIS — Z961 Presence of intraocular lens: Secondary | ICD-10-CM | POA: Diagnosis not present

## 2018-09-25 DIAGNOSIS — H401133 Primary open-angle glaucoma, bilateral, severe stage: Secondary | ICD-10-CM | POA: Diagnosis not present

## 2018-10-09 ENCOUNTER — Other Ambulatory Visit: Payer: Self-pay | Admitting: Internal Medicine

## 2018-10-10 ENCOUNTER — Telehealth: Payer: Self-pay | Admitting: Gastroenterology

## 2018-10-10 NOTE — Telephone Encounter (Signed)
Pt requested a refill for B12 syringes: 3 BD 3 cc 25 gauge 1 inch.

## 2018-10-11 NOTE — Telephone Encounter (Signed)
Called and spoke to pt. Dr. Quay Burow' office sent the script for B12 and should inform pharmacy to include the necessary syringes and needles. Let pt know he could call the pharmacy and ask them to contact Dr. Quay Burow office or just call Dr. Quay Burow office. He expressed understanding and agreement.

## 2018-10-15 ENCOUNTER — Telehealth: Payer: Self-pay | Admitting: Gastroenterology

## 2018-10-15 NOTE — Telephone Encounter (Signed)
Pls call pt, he has some questions regarding his treatment for his crohns.

## 2018-10-16 ENCOUNTER — Other Ambulatory Visit: Payer: Self-pay

## 2018-10-16 DIAGNOSIS — K50012 Crohn's disease of small intestine with intestinal obstruction: Secondary | ICD-10-CM

## 2018-10-16 MED ORDER — BUDESONIDE 3 MG PO CPEP: 9.0000 mg | ORAL_CAPSULE | Freq: Every day | ORAL | 0 refills | Status: DC

## 2018-10-16 NOTE — Telephone Encounter (Signed)
Patient called and states he has been having intermittent symptoms of his Crohn's for about 1 week. Pain in his middle abdomin and firm. Denies any fever, diarrhea. Wants to know if Dr. Havery Moros thinks he should take the Budisonide (2 week dose) that he gave him prn. Please advise

## 2018-10-16 NOTE — Telephone Encounter (Signed)
Thanks Customer service manager. Yes if he thinks his Crohn's is bothering him, recommend budesonide 11m / day for 4 weeks, then 652m/ day for 2 weeks, then 9m87m day for 2 weeks. I haven't seen him in a year, can you please schedule a follow up with me or one of the APPs. He should also go to the lab for CBC and CMET given his labs appear overdue as well. If he has worsening pain, vomiting, etc, despite therapy in the interim until he can be seen in clinic, he should contact us [place]. Thanks

## 2018-10-16 NOTE — Telephone Encounter (Signed)
Sent order to patient's pharmacy for the taper dosing of Budesonide and patient instructed on taking. Order for CBC & CMET  and patient will come in for them in a few days. Scheduled for office visit with Dr. Havery Moros 11/19/18 at 1:40pm

## 2018-10-18 ENCOUNTER — Other Ambulatory Visit (INDEPENDENT_AMBULATORY_CARE_PROVIDER_SITE_OTHER): Payer: PPO

## 2018-10-18 DIAGNOSIS — K50012 Crohn's disease of small intestine with intestinal obstruction: Secondary | ICD-10-CM | POA: Diagnosis not present

## 2018-10-18 LAB — COMPREHENSIVE METABOLIC PANEL
ALT: 9 U/L (ref 0–53)
AST: 13 U/L (ref 0–37)
Albumin: 4.1 g/dL (ref 3.5–5.2)
Alkaline Phosphatase: 55 U/L (ref 39–117)
BUN: 13 mg/dL (ref 6–23)
CO2: 26 mEq/L (ref 19–32)
Calcium: 9.2 mg/dL (ref 8.4–10.5)
Chloride: 105 mEq/L (ref 96–112)
Creatinine, Ser: 1.06 mg/dL (ref 0.40–1.50)
GFR: 66.44 mL/min (ref >60.00)
Glucose, Bld: 111 mg/dL — ABNORMAL HIGH (ref 70–99)
Potassium: 3.8 mEq/L (ref 3.5–5.1)
Sodium: 138 mEq/L (ref 135–145)
Total Bilirubin: 0.8 mg/dL (ref 0.2–1.2)
Total Protein: 7.3 g/dL (ref 6.0–8.3)

## 2018-10-18 LAB — CBC WITH DIFFERENTIAL/PLATELET
Basophils Absolute: 0 10*3/uL (ref 0.0–0.1)
Basophils Relative: 0.3 % (ref 0.0–3.0)
Eosinophils Absolute: 0.2 10*3/uL (ref 0.0–0.7)
Eosinophils Relative: 2.6 % (ref 0.0–5.0)
HCT: 37.6 % — ABNORMAL LOW (ref 39.0–52.0)
Hemoglobin: 12.8 g/dL — ABNORMAL LOW (ref 13.0–17.0)
Lymphocytes Relative: 24.8 % (ref 12.0–46.0)
Lymphs Abs: 2 10*3/uL (ref 0.7–4.0)
MCHC: 34 g/dL (ref 30.0–36.0)
MCV: 94.9 fl (ref 78.0–100.0)
Monocytes Absolute: 0.7 10*3/uL (ref 0.1–1.0)
Monocytes Relative: 8.7 % (ref 3.0–12.0)
Neutro Abs: 5 10*3/uL (ref 1.4–7.7)
Neutrophils Relative %: 63.6 % (ref 43.0–77.0)
Platelets: 213 10*3/uL (ref 150.0–400.0)
RBC: 3.96 Mil/uL — ABNORMAL LOW (ref 4.22–5.81)
RDW: 12.9 % (ref 11.5–15.5)
WBC: 7.9 10*3/uL (ref 4.0–10.5)

## 2018-10-19 ENCOUNTER — Telehealth: Payer: Self-pay | Admitting: Gastroenterology

## 2018-10-19 NOTE — Telephone Encounter (Signed)
Called patient back and let him know his labs need to be reviewed by the Dr. And then they would be released and we will call him with them. He is good with that

## 2018-11-04 NOTE — Progress Notes (Signed)
Subjective:    Patient ID: Michael [name], male    DOB: [birth year], 85 y.o.   MRN: [id number]  HPI He is here for a physical exam.   He has no concerns.  Overall he feels well.  Medications and allergies reviewed with patient and updated if appropriate.  Patient Active Problem List   Diagnosis Date Noted  . Asymptomatic varicose veins of left lower extremity 12/16/2017  . Hyperglycemia 11/02/2017  . 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 Clavicle fracture 11/02/2017  . Vitamin D deficiency 11/02/2017  . Leg DVT (deep venous thromboembolism), acute, left ([place]) 08/08/2017  . SBO (small bowel obstruction) ([place]) 07/05/2017  . Bilateral hearing loss 05/31/2017  . B12 deficiency 02/06/2017  . Osteopenia 02/06/2017  . Glaucoma 12/12/2015  . Crohn's disease ([place]) 12/12/2015  . Anemia 12/12/2015  . 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 augmented betamethasone dipropionate (DIPROLENE-AF) 0.05 % cream     . brimonidine (ALPHAGAN) 0.2 % ophthalmic solution Place 1 drop into both eyes 2 (two) times daily.    . budesonide (ENTOCORT EC) 3 MG 24 hr capsule Take 3 capsules (9 mg total) by mouth daily. Take 9 mg daily for 4 weeks, then 6 mg daily for 2 weeks, then 3 mg daily for 2 weeks 126 capsule 0  . Calcium Carbonate-Vit D-Min (CALCIUM 1200 PO) Take by mouth daily.    . Cholecalciferol (VITAMIN D) 2000 units CAPS Take 1 capsule (2,000 Units total) by mouth daily. 30 capsule   . cyanocobalamin (,VITAMIN B-12,) 1000 MCG/ML injection INJECT 1ML INTO THE MUSCLE EVERY 30 DAYS 10 mL 0  . dorzolamide-timolol (COSOPT) 22.3-6.8 MG/ML ophthalmic solution Place 1 drop into both eyes 2 (two) times daily.    . IRON PO Take 325 mg by mouth 3 (three) times a week.     . latanoprost (XALATAN) 0.005 % ophthalmic solution     . Netarsudil Dimesylate (RHOPRESSA) 0.02 % SOLN Apply to eye.    . NON FORMULARY Anti Diarrhea    .  omeprazole (PRILOSEC) 20 MG capsule Take 20 mg by mouth daily. Before breakfast    . prednisoLONE acetate (PRED FORTE) 1 % ophthalmic suspension      No current facility-administered medications on file prior to visit.     Past Medical History:  Diagnosis Date  . Adrenal insufficiency ([place])   . Anemia   . Avascular necrosis ([place])   . Crohn's disease ([place])   . Glaucoma   . Inguinal hernia recurrent bilateral   . Low testosterone   . Osteopenia   . Retinal ischemia     Past Surgical History:  Procedure Laterality Date  . ABDOMINAL SURGERY    . COLONOSCOPY N/A 12/13/2015   Procedure: COLONOSCOPY;  Surgeon: Laurence Spates, MD;  Location: [facility];  Service: Endoscopy;  Laterality: N/A;  . INGUINAL HERNIA REPAIR Bilateral 05/21/2014   Procedure: OPEN BILATERAL INGUINAL HERNIA REPAIRS WITH MESH;  Surgeon: Donnie Mesa, MD;  Location: [facility];  Service: General;  Laterality: Bilateral;  . SMALL INTESTINE SURGERY  1968, 2001    related to Chrohn's disease  . TONSILLECTOMY      Social History   Socioeconomic History  . Marital status: Married    Spouse name: Not on file  . Number of children: 3  . Years of education: Not on file  . Highest education level: Not on file  Occupational History  .  Occupation: Retired  Scientific laboratory technician  . Financial resource strain: Not on file  . Food insecurity    Worry: Not on file    Inability: Not on file  . Transportation needs    Medical: Not on file    Non-medical: Not on file  Tobacco Use  . Smoking status: Former Smoker    Quit date: 04/01/1976    Years since quitting: 42.6  . Smokeless tobacco: Never Used  Substance and Sexual Activity  . Alcohol use: Yes    Comment: 2-3  glass wine  weekly  1-2 cans beer/ week  . Drug use: No  . Sexual activity: Never  Lifestyle  . Physical activity    Days per week: Not on file    Minutes per session: Not on file  . Stress: Not on file  Relationships  . Social Product manager on phone: Not on file    Gets together: Not on file    Attends religious service: Not on file    Active member of club or organization: Not on file    Attends meetings of clubs or organizations: Not on file    Relationship status: Not on file  Other Topics Concern  . Not on file  Social History Narrative  . Not on file    Family History  Problem Relation Age of Onset  . Deep vein thrombosis Mother   . Heart disease Father     Review of Systems  Constitutional: Negative for chills and fever.  Eyes: Negative for visual disturbance.  Respiratory: Negative for cough, shortness of breath and wheezing.   Cardiovascular: Negative for chest pain, palpitations and leg swelling.  Gastrointestinal: Negative for abdominal pain, blood in stool, constipation, diarrhea and nausea.       No gerd  Genitourinary: Negative for dysuria and hematuria.  Musculoskeletal: Negative for arthralgias and back pain.  Skin: Negative for color change and rash.  Neurological: Negative for dizziness, light-headedness and headaches.  Psychiatric/Behavioral: Negative for dysphoric mood. The patient is not nervous/anxious.        Objective:   Vitals:   11/05/18 0851  BP: 126/78  Pulse: 80  Temp: 98.1 F (36.7 C)  SpO2: 96%   Filed Weights   11/05/18 0851  Weight: 188 lb 12.8 oz (85.6 kg)   Body mass index is 26.33 kg/m.  BP Readings from Last 3 Encounters:  11/05/18 126/78  12/15/17 128/68  11/02/17 132/60    Wt Readings from Last 3 Encounters:  11/05/18 188 lb 12.8 oz (85.6 kg)  12/15/17 186 lb (84.4 kg)  11/02/17 184 lb (83.5 kg)     Physical Exam Constitutional: He appears well-developed and well-nourished. No distress.  HENT:  Head: Normocephalic and atraumatic.  Right Ear: External ear normal.  Left Ear: External ear normal.  Mouth/Throat: Oropharynx is clear and moist.  Normal ear canals and TM b/l  Eyes: Conjunctivae and EOM are normal.  Neck: Neck supple. No  tracheal deviation present. No thyromegaly present.  No carotid bruit  Cardiovascular: Normal rate, regular rhythm, normal heart sounds and intact distal pulses.   No murmur heard. Pulmonary/Chest: Effort normal and breath sounds normal. No respiratory distress. He has no wheezes. He has no rales.  Abdominal: Soft. He exhibits no distension. There is minimal tenderness that is chronic along the scar from his previous intestinal surgery-related to Crohn's.  No concerning tenderness.  No rebound or guarding.  Genitourinary: deferred  Musculoskeletal: He exhibits no edema.  Lymphadenopathy:   He has no cervical adenopathy.  Skin: Skin is warm and dry. He is not diaphoretic.  Psychiatric: He has a normal mood and affect. His behavior is normal.         Assessment & Plan:   Physical exam: Screening blood work  ordered Immunizations  All up to date Colonoscopy   N/a - no longer needed due to age Eye exams   Up to date  Exercise   Tennis, walking Weight  Weight is good Substance abuse   none Sees derm annually - Dr Wilhemina Bonito   See Problem List for Assessment and Plan of chronic medical problems.  Follow-up in 6 months

## 2018-11-04 NOTE — Patient Instructions (Addendum)
Tests ordered today. Your results will be released to [place] (or called to you) after review.  If any changes need to be made, you will be notified at that same time.  All other Health Maintenance issues reviewed.   All recommended immunizations and age-appropriate screenings are up-to-date or discussed.  No immunization administered today.   Medications reviewed and updated.  Changes include :   none    Please followup in 6 months    Health Maintenance, Male Adopting a healthy lifestyle and getting preventive care are important in promoting health and wellness. Ask your health care provider about:  The right schedule for you to have regular tests and exams.  Things you can do on your own to prevent diseases and keep yourself healthy. What should I know about diet, weight, and exercise? Eat a healthy diet   Eat a diet that includes plenty of vegetables, fruits, low-fat dairy products, and lean protein.  Do not eat a lot of foods that are high in solid fats, added sugars, or sodium. Maintain a healthy weight Body mass index (BMI) is a measurement that can be used to identify possible weight problems. It estimates body fat based on height and weight. Your health care provider can help determine your BMI and help you achieve or maintain a healthy weight. Get regular exercise Get regular exercise. This is one of the most important things you can do for your health. Most adults should:  Exercise for at least 150 minutes each week. The exercise should increase your heart rate and make you sweat (moderate-intensity exercise).  Do strengthening exercises at least twice a week. This is in addition to the moderate-intensity exercise.  Spend less time sitting. Even light physical activity can be beneficial. Watch cholesterol and blood lipids Have your blood tested for lipids and cholesterol at 83 years of age, then have this test every 5 years. You may need to have your cholesterol  levels checked more often if:  Your lipid or cholesterol levels are high.  You are older than 83 years of age.  You are at high risk for heart disease. What should I know about cancer screening? Many types of cancers can be detected early and may often be prevented. Depending on your health history and family history, you may need to have cancer screening at various ages. This may include screening for:  Colorectal cancer.  Prostate cancer.  Skin cancer.  Lung cancer. What should I know about heart disease, diabetes, and high blood pressure? Blood pressure and heart disease  High blood pressure causes heart disease and increases the risk of stroke. This is more likely to develop in people who have high blood pressure readings, are of African descent, or are overweight.  Talk with your health care provider about your target blood pressure readings.  Have your blood pressure checked: ? Every 3-5 years if you are 58-86 years of age. ? Every year if you are 62 years old or older.  If you are between the ages of 36 and 95 and are a current or former smoker, ask your health care provider if you should have a one-time screening for abdominal aortic aneurysm (AAA). Diabetes Have regular diabetes screenings. This checks your fasting blood sugar level. Have the screening done:  Once every three years after age 59 if you are at a normal weight and have a low risk for diabetes.  More often and at a younger age if you are overweight or have a high  risk for diabetes. What should I know about preventing infection? Hepatitis B If you have a higher risk for hepatitis B, you should be screened for this virus. Talk with your health care provider to find out if you are at risk for hepatitis B infection. Hepatitis C Blood testing is recommended for:  Everyone born from [birth year] through 1963.  Anyone with known risk factors for hepatitis C. Sexually transmitted infections (STIs)  You should be  screened each year for STIs, including gonorrhea and chlamydia, if: ? You are sexually active and are younger than 83 years of age. ? You are older than 83 years of age and your health care provider tells you that you are at risk for this type of infection. ? Your sexual activity has changed since you were last screened, and you are at increased risk for chlamydia or gonorrhea. Ask your health care provider if you are at risk.  Ask your health care provider about whether you are at high risk for HIV. Your health care provider may recommend a prescription medicine to help prevent HIV infection. If you choose to take medicine to prevent HIV, you should first get tested for HIV. You should then be tested every 3 months for as long as you are taking the medicine. Follow these instructions at home: Lifestyle  Do not use any products that contain nicotine or tobacco, such as cigarettes, e-cigarettes, and chewing tobacco. If you need help quitting, ask your health care provider.  Do not use street drugs.  Do not share needles.  Ask your health care provider for help if you need support or information about quitting drugs. Alcohol use  Do not drink alcohol if your health care provider tells you not to drink.  If you drink alcohol: ? Limit how much you have to 0-2 drinks a day. ? Be aware of how much alcohol is in your drink. In the [place] one drink equals one 12 oz bottle of beer (355 mL), one 5 oz glass of wine (148 mL), or one 1 oz glass of hard liquor (44 mL). General instructions  Schedule regular health, dental, and eye exams.  Stay current with your vaccines.  Tell your health care provider if: ? You often feel depressed. ? You have ever been abused or do not feel safe at home. Summary  Adopting a healthy lifestyle and getting preventive care are important in promoting health and wellness.  Follow your health care provider's instructions about healthy diet, exercising, and getting  tested or screened for diseases.  Follow your health care provider's instructions on monitoring your cholesterol and blood pressure. This information is not intended to replace advice given to you by your health care provider. Make sure you discuss any questions you have with your health care provider. Document Released: 06/25/2007 Document Revised: 12/20/2017 Document Reviewed: 12/20/2017 Elsevier Patient Education  2020 [place]

## 2018-11-05 ENCOUNTER — Other Ambulatory Visit (INDEPENDENT_AMBULATORY_CARE_PROVIDER_SITE_OTHER): Payer: PPO

## 2018-11-05 ENCOUNTER — Ambulatory Visit (INDEPENDENT_AMBULATORY_CARE_PROVIDER_SITE_OTHER): Payer: PPO | Admitting: Internal Medicine

## 2018-11-05 ENCOUNTER — Encounter: Payer: Self-pay | Admitting: Internal Medicine

## 2018-11-05 ENCOUNTER — Other Ambulatory Visit: Payer: Self-pay

## 2018-11-05 VITALS — BP 126/78 | HR 80 | Temp 98.1°F | Ht 71.0 in | Wt 188.8 lb

## 2018-11-05 DIAGNOSIS — M85852 Other specified disorders of bone density and structure, left thigh: Secondary | ICD-10-CM

## 2018-11-05 DIAGNOSIS — K50012 Crohn's disease of small intestine with intestinal obstruction: Secondary | ICD-10-CM

## 2018-11-05 DIAGNOSIS — E538 Deficiency of other specified B group vitamins: Secondary | ICD-10-CM

## 2018-11-05 DIAGNOSIS — R739 Hyperglycemia, unspecified: Secondary | ICD-10-CM

## 2018-11-05 DIAGNOSIS — M85851 Other specified disorders of bone density and structure, right thigh: Secondary | ICD-10-CM

## 2018-11-05 DIAGNOSIS — Z0000 Encounter for general adult medical examination without abnormal findings: Secondary | ICD-10-CM | POA: Diagnosis not present

## 2018-11-05 DIAGNOSIS — D649 Anemia, unspecified: Secondary | ICD-10-CM

## 2018-11-05 DIAGNOSIS — E559 Vitamin D deficiency, unspecified: Secondary | ICD-10-CM

## 2018-11-05 LAB — COMPREHENSIVE METABOLIC PANEL
ALT: 15 U/L (ref 0–53)
AST: 13 U/L (ref 0–37)
Albumin: 4 g/dL (ref 3.5–5.2)
Alkaline Phosphatase: 50 U/L (ref 39–117)
BUN: 16 mg/dL (ref 6–23)
CO2: 31 mEq/L (ref 19–32)
Calcium: 9.5 mg/dL (ref 8.4–10.5)
Chloride: 99 mEq/L (ref 96–112)
Creatinine, Ser: 0.94 mg/dL (ref 0.40–1.50)
GFR: 76.31 mL/min (ref >60.00)
Glucose, Bld: 87 mg/dL (ref 70–99)
Potassium: 3.9 mEq/L (ref 3.5–5.1)
Sodium: 138 mEq/L (ref 135–145)
Total Bilirubin: 1.6 mg/dL — ABNORMAL HIGH (ref 0.2–1.2)
Total Protein: 7.2 g/dL (ref 6.0–8.3)

## 2018-11-05 LAB — CBC WITH DIFFERENTIAL/PLATELET
Basophils Absolute: 0 10*3/uL (ref 0.0–0.1)
Basophils Relative: 0.4 % (ref 0.0–3.0)
Eosinophils Absolute: 0.2 10*3/uL (ref 0.0–0.7)
Eosinophils Relative: 1.6 % (ref 0.0–5.0)
HCT: 38.9 % — ABNORMAL LOW (ref 39.0–52.0)
Hemoglobin: 13.1 g/dL (ref 13.0–17.0)
Lymphocytes Relative: 16.3 % (ref 12.0–46.0)
Lymphs Abs: 2.1 10*3/uL (ref 0.7–4.0)
MCHC: 33.6 g/dL (ref 30.0–36.0)
MCV: 95.8 fl (ref 78.0–100.0)
Monocytes Absolute: 1.1 10*3/uL — ABNORMAL HIGH (ref 0.1–1.0)
Monocytes Relative: 8.4 % (ref 3.0–12.0)
Neutro Abs: 9.4 10*3/uL — ABNORMAL HIGH (ref 1.4–7.7)
Neutrophils Relative %: 73.3 % (ref 43.0–77.0)
Platelets: 203 10*3/uL (ref 150.0–400.0)
RBC: 4.06 Mil/uL — ABNORMAL LOW (ref 4.22–5.81)
RDW: 13.8 % (ref 11.5–15.5)
WBC: 12.9 10*3/uL — ABNORMAL HIGH (ref 4.0–10.5)

## 2018-11-05 LAB — LIPID PANEL
Cholesterol: 111 mg/dL (ref 0–200)
HDL: 42.1 mg/dL (ref >39.00)
LDL Cholesterol: 30 mg/dL (ref 0–99)
NonHDL: 68.85
Total CHOL/HDL Ratio: 3
Triglycerides: 196 mg/dL — ABNORMAL HIGH (ref 0.0–149.0)
VLDL: 39.2 mg/dL (ref 0.0–40.0)

## 2018-11-05 LAB — TSH: TSH: 2.21 u[IU]/mL (ref 0.35–4.50)

## 2018-11-05 LAB — HEMOGLOBIN A1C: Hgb A1c MFr Bld: 5.5 % (ref 4.6–6.5)

## 2018-11-05 LAB — VITAMIN B12: Vitamin B-12: 220 pg/mL (ref 211–911)

## 2018-11-05 LAB — VITAMIN D 25 HYDROXY (VIT D DEFICIENCY, FRACTURES): VITD: 36.61 ng/mL (ref 30.00–100.00)

## 2018-11-05 NOTE — Assessment & Plan Note (Signed)
Check B12 level. 

## 2018-11-05 NOTE — Assessment & Plan Note (Signed)
Taking calcium and vitamin d daily Exercising will get dexa next year

## 2018-11-05 NOTE — Assessment & Plan Note (Signed)
a1c

## 2018-11-05 NOTE — Assessment & Plan Note (Signed)
Following with Dr Havery Moros Taking budesonide

## 2018-11-05 NOTE — Assessment & Plan Note (Signed)
Taking vitamin D daily Will check vitamin D level

## 2018-11-05 NOTE — Assessment & Plan Note (Signed)
CBC, iron panel

## 2018-11-06 LAB — IRON,TIBC AND FERRITIN PANEL
%SAT: 45 % (calc) (ref 20–48)
Ferritin: 173 ng/mL (ref 24–380)
Iron: 132 ug/dL (ref 50–180)
TIBC: 292 mcg/dL (calc) (ref 250–425)

## 2018-11-19 ENCOUNTER — Ambulatory Visit: Payer: PPO | Admitting: Gastroenterology

## 2018-11-19 ENCOUNTER — Encounter: Payer: Self-pay | Admitting: Gastroenterology

## 2018-11-19 VITALS — BP 128/78 | HR 95 | Temp 98.5°F | Ht 71.0 in | Wt 188.0 lb

## 2018-11-19 DIAGNOSIS — K50012 Crohn's disease of small intestine with intestinal obstruction: Secondary | ICD-10-CM

## 2018-11-19 NOTE — Progress Notes (Signed)
HPI :  85 year old male here for follow-up for Crohn's disease.   Crohn's history: Previously followed by Dr. Earle Gell. 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 He has had 2 operations for his Crohns in 1968 and then again 2001 or so for small bowel resections. He has been treated with prednisone as needed in the past. He has never been on any other therapy for Crohn's disease other than prednisone and surgery. He has had hospitalizations in the past for bowel obstructions. Last hospitalization June 2019, prior to that a few years previously. He has not needed much of any therapy for his Crohn's disease in the past few years and has generally been feeling pretty well. Unfortunately he has suffered complications from chronic steroid use. He is currently being followed by Endocrinology foradrenal insufficiency. He has also been noted to have hepatic steatosis and avascular necrosis of the hips on CT scan. His course has also been complicated by history of diverticulosis related bleeding.  SINCE LAST VISIT:  85 year old male here for follow-up visit.  He has generally been doing pretty well since have seen him almost a year ago.  He was last hospitalized in June 2019 for bowel obstruction.  It was a short-lived admission, he resolved quickly on steroids and has not required steroids since then until most recently.  After that hospitalization we discussed options to include escalation of therapy with Biologics, surgical consult, or using budesonide as needed.  He has declined biologic therapy and surgery in the past, wanted to use budesonide as needed and maintain a low residual diet.  He was in his usual state of health until about a month ago, he felt some intermittent tightness in his abdomen, no significant pain.  This usually occurs after he eats roughly 1 hour.  He denies any constipation or diarrhea.  No nausea or vomiting.  He states that he can become distended at the end of the  day and then in the morning his stomach is no longer distended he feels much better.  He had been on a low residual diet and tends to be doing pretty well with that.  He is on B12 repletion therapy, he denies any NSAID use.  I had placed him on a course of budesonide 9 mg a day in early October which he took for the past 4 weeks.  He states this definitely helped and his symptoms are pretty minimal at this point.  He recently tapered to 6 mg a day and is supposed to take this for another 2 weeks and then taper to 3 mg a day for 2 weeks and then stop.  His weight is stable, he otherwise is in good spirits and denies any other complaints today.  He has never had maintenance therapy for his Crohn's disease, he has declined this when offered in the past. We discussed options again moving forward.  Colonoscopy 12/2015 - normal colon, patent ileocolonic anastomosis, active Crohn's ileitis   Past Medical History:  Diagnosis Date  . Adrenal insufficiency ([place])   . Anemia   . Avascular necrosis ([place])   . Crohn's disease ([place])   . Glaucoma   . Inguinal hernia recurrent bilateral   . Low testosterone   . Osteopenia   . Retinal ischemia      Past Surgical History:  Procedure Laterality Date  . ABDOMINAL SURGERY    . COLONOSCOPY N/A 12/13/2015   Procedure: COLONOSCOPY;  Surgeon: Laurence Spates, MD;  Location: [facility];  Service: Endoscopy;  Laterality: N/A;  . INGUINAL HERNIA REPAIR Bilateral 05/21/2014   Procedure: OPEN BILATERAL INGUINAL HERNIA REPAIRS WITH MESH;  Surgeon: Donnie Mesa, MD;  Location: [facility];  Service: General;  Laterality: Bilateral;  . SMALL INTESTINE SURGERY  1968, 2001    related to Chrohn's disease  . TONSILLECTOMY     Family History  Problem Relation Age of Onset  . Deep vein thrombosis Mother   . Heart disease Father   . Stomach cancer Neg Hx   . Colon cancer Neg Hx   . Esophageal cancer Neg Hx   . Pancreatic cancer Neg Hx    Social  History   Tobacco Use  . Smoking status: Former Smoker    Quit date: 04/01/1976    Years since quitting: 42.6  . Smokeless tobacco: Never Used  Substance Use Topics  . Alcohol use: Yes    Comment: 2-3  glass wine  weekly  1-2 cans beer/ week  . Drug use: No   Current Outpatient Medications  Medication Sig Dispense Refill  . augmented betamethasone dipropionate (DIPROLENE-AF) 0.05 % cream     . brimonidine (ALPHAGAN) 0.2 % ophthalmic solution Place 1 drop into both eyes 2 (two) times daily.    . budesonide (ENTOCORT EC) 3 MG 24 hr capsule Take 3 capsules (9 mg total) by mouth daily. Take 9 mg daily for 4 weeks, then 6 mg daily for 2 weeks, then 3 mg daily for 2 weeks 126 capsule 0  . Calcium Carbonate-Vit D-Min (CALCIUM 1200 PO) Take by mouth daily.    . Cholecalciferol (VITAMIN D) 2000 units CAPS Take 1 capsule (2,000 Units total) by mouth daily. 30 capsule   . cyanocobalamin (,VITAMIN B-12,) 1000 MCG/ML injection INJECT 1ML INTO THE MUSCLE EVERY 30 DAYS 10 mL 0  . dorzolamide-timolol (COSOPT) 22.3-6.8 MG/ML ophthalmic solution Place 1 drop into both eyes 2 (two) times daily.    . IRON PO Take 325 mg by mouth 3 (three) times a week.     . latanoprost (XALATAN) 0.005 % ophthalmic solution     . Netarsudil Dimesylate (RHOPRESSA) 0.02 % SOLN Apply to eye.    . NON FORMULARY Anti Diarrhea    . omeprazole (PRILOSEC) 20 MG capsule Take 20 mg by mouth daily. Before breakfast    . prednisoLONE acetate (PRED FORTE) 1 % ophthalmic suspension      No current facility-administered medications for this visit.    No Known Allergies   Review of Systems: All systems reviewed and negative except where noted in HPI.   Lab Results  Component Value Date   WBC 12.9 (H) 11/05/2018   HGB 13.1 11/05/2018   HCT 38.9 (L) 11/05/2018   MCV 95.8 11/05/2018   PLT 203.0 11/05/2018    Lab Results  Component Value Date   CREATININE 0.94 11/05/2018   BUN 16 11/05/2018   NA 138 11/05/2018   K 3.9  11/05/2018   CL 99 11/05/2018   CO2 31 11/05/2018    Lab Results  Component Value Date   ALT 15 11/05/2018   AST 13 11/05/2018   ALKPHOS 50 11/05/2018   BILITOT 1.6 (H) 11/05/2018     Physical Exam: BP 128/78   Pulse 95   Temp 98.5 F (36.9 C)   Ht 5' 11"  (1.803 m)   Wt 188 lb (85.3 kg)   BMI 26.22 kg/m  Constitutional: Pleasant,well-developed, male in no acute distress. HEENT: Normocephalic and atraumatic. Conjunctivae are normal. No scleral icterus. Neck supple.  Cardiovascular: Normal rate, regular rhythm.  Pulmonary/chest: Effort normal and breath sounds normal. No wheezing, rales or rhonchi. Abdominal: Soft, nondistended, nontender. There are no masses palpable. No hepatomegaly. Extremities: no edema Lymphadenopathy: No cervical adenopathy noted. Neurological: Alert and oriented to person place and time. Skin: Skin is warm and dry. No rashes noted. Psychiatric: Normal mood and affect. Behavior is normal.   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  He has had Crohn's disease more than 50 years and has never been on any maintenance therapy to date.  He has had history of steroid use remotely with complications from this.  We have discussed his course at length. He has a long segment of stricture in the ileum, at high risk for recurrence of obstruction and continued symptoms. I have discussed options with him to include surgery, biologic therapy/escalation of medical therapy, versus continuing with budesonide as needed and avoiding aggressive interventions given his age.  Given his age he has strongly declined surgery and escalation of medical therapy unless he has flareups or problems frequent enough to lead to recurrent hospitalization or severe obstruction. Over the past 1 and 1/2 yrs he has  required budesonide only once, and generally doing okay outside of that recent flare. After a course of budesonide his symptoms are much better and he is tolerating the taper.  He will continue to taper off budesonide and I recommend he maintain a low residual diet moving forward.  If he has recurrence of symptoms moving forward I asked him to contact me. As long as he does not get readmitted he does not want to escalate therapy. Otherwise his labs are up-to-date and look okay other than mild leukocytosis likely related to his Crohn's disease/budesonide.  I would like to see him again in 3 to 4 months for follow-up.  He understands that if he develops obstructive symptoms moving forward he needs to contact me ASAP.  Beauregard Cellar, MD [facility]

## 2018-12-10 DIAGNOSIS — H401133 Primary open-angle glaucoma, bilateral, severe stage: Secondary | ICD-10-CM | POA: Diagnosis not present

## 2018-12-10 DIAGNOSIS — Z961 Presence of intraocular lens: Secondary | ICD-10-CM | POA: Diagnosis not present

## 2018-12-10 DIAGNOSIS — H04123 Dry eye syndrome of bilateral lacrimal glands: Secondary | ICD-10-CM | POA: Diagnosis not present

## 2018-12-12 ENCOUNTER — Other Ambulatory Visit: Payer: Self-pay

## 2018-12-12 ENCOUNTER — Telehealth: Payer: Self-pay | Admitting: Gastroenterology

## 2018-12-12 MED ORDER — BUDESONIDE 3 MG PO CPEP: 9.0000 mg | ORAL_CAPSULE | Freq: Every day | ORAL | 0 refills | Status: DC

## 2018-12-12 NOTE — Telephone Encounter (Signed)
Patient called and states the last 3 days his abdomin has been hard and is painful. States his BMs are normal for him.(every other day and semi-firm). And that last night it kept him up. Thinks he may need prednisone.

## 2018-12-12 NOTE — Telephone Encounter (Signed)
Pt reported that he is having a Crohn's flare up and would like advice on medication.

## 2018-12-12 NOTE — Telephone Encounter (Signed)
Sent order to patient's pharmacy for Budesonide and gave instructions for taking

## 2018-12-12 NOTE — Telephone Encounter (Signed)
He should be on a low residual diet. He should take budesonide 65m / day for 4 weeks, then 624m/ day for 2 weeks, then 2m25m day for 2 weeks, then off, if you can prescribe for him. Thanks

## 2018-12-13 NOTE — Telephone Encounter (Signed)
Pt called to inform that Budesonide worked well for him and that he is very grateful.

## 2018-12-14 ENCOUNTER — Telehealth: Payer: Self-pay | Admitting: Gastroenterology

## 2018-12-14 ENCOUNTER — Other Ambulatory Visit: Payer: Self-pay

## 2018-12-14 DIAGNOSIS — K50012 Crohn's disease of small intestine with intestinal obstruction: Secondary | ICD-10-CM

## 2018-12-14 NOTE — Telephone Encounter (Signed)
Wife of patient called and states his pain is better with going back on the higher dose of Budesonide, but his abdomin is still firm. He is ready to do something else. Asking if he should have a scan or consult his surgeon (that he has seen before) while the crohn's is active

## 2018-12-14 NOTE — Telephone Encounter (Signed)
He should go on a liquid diet for 1-2 days and see how he feels. We can do an xray of his abdomen, 2 views, to ensure no obstruction. Thanks. I don't think he needs to call a surgeon, would only do that if he was in the hospital. If he's not getting better and can't tolerate PO, etc, he needs to come to the hospital. Thanks

## 2018-12-14 NOTE — Telephone Encounter (Signed)
Called patient and let him know Dr. Havery Moros would like him to go on a liquid diet for 1-2 days and come into our radiology dept. for an abdominal xray. That he does not think he needs to call a Psychologist, sport and exercise. But if he can't tolerate PO, he needs to go to the hospital. Patient says he will come in for the xray on Monday and try the liquid diet for 1 day. States a few hours after taking the Budesonide his abdomin softens for a while

## 2018-12-17 ENCOUNTER — Other Ambulatory Visit: Payer: Self-pay

## 2018-12-17 ENCOUNTER — Other Ambulatory Visit: Payer: PPO

## 2018-12-17 ENCOUNTER — Ambulatory Visit (INDEPENDENT_AMBULATORY_CARE_PROVIDER_SITE_OTHER)
Admission: RE | Admit: 2018-12-17 | Discharge: 2018-12-17 | Disposition: A | Discharge status: Home / Self Care | Payer: PPO | Source: Ambulatory Visit | Attending: Gastroenterology | Admitting: Gastroenterology

## 2018-12-17 DIAGNOSIS — R109 Unspecified abdominal pain: Secondary | ICD-10-CM | POA: Diagnosis not present

## 2018-12-17 DIAGNOSIS — K50012 Crohn's disease of small intestine with intestinal obstruction: Secondary | ICD-10-CM

## 2018-12-19 DIAGNOSIS — L570 Actinic keratosis: Secondary | ICD-10-CM | POA: Diagnosis not present

## 2018-12-19 DIAGNOSIS — L84 Corns and callosities: Secondary | ICD-10-CM | POA: Diagnosis not present

## 2018-12-19 DIAGNOSIS — L821 Other seborrheic keratosis: Secondary | ICD-10-CM | POA: Diagnosis not present

## 2018-12-19 DIAGNOSIS — Z85828 Personal history of other malignant neoplasm of skin: Secondary | ICD-10-CM | POA: Diagnosis not present

## 2018-12-21 ENCOUNTER — Telehealth: Payer: Self-pay | Admitting: Gastroenterology

## 2018-12-21 NOTE — Telephone Encounter (Signed)
Sherlynn Stalls I agree with you. If he is not vomiting and tolerating soft foods that is good. If he thinks he needs something stronger than budesonide we can switch him to prednisone, which he would be on for a while with taper. If he thinks he needs the prednisone let me know, would place him on 8m po / day, for 2 weeks, followed by taper by 520m/ week until done. Thanks

## 2018-12-21 NOTE — Telephone Encounter (Signed)
Called patient back and he is doing the same now that he has been on soft solids  for 2 days, as when he was on clear liquids; normal BMs and abdomin is firm in the mornings and softens a few hours after taking his Budisonide. Wanted to know if he should have a follow-up xray to check the partial obstruction noted previously. I suggested he give a couple of more days on solids and see how he is doing Monday. Please advise.

## 2018-12-31 ENCOUNTER — Telehealth: Payer: Self-pay

## 2018-12-31 ENCOUNTER — Other Ambulatory Visit: Payer: Self-pay

## 2018-12-31 DIAGNOSIS — K50012 Crohn's disease of small intestine with intestinal obstruction: Secondary | ICD-10-CM

## 2018-12-31 NOTE — Telephone Encounter (Signed)
Patient called and wanted to let Dr. Havery Moros know he is still having a tight abdomin after eating solid food. If he goes back to liquid, it is OK. States no pain like before and is passing gas and having BMs. Wanted to know if he should have another xray to check on the partial bowel obstruction noted a few weeks ago. Please advise

## 2018-12-31 NOTE — Telephone Encounter (Signed)
Thanks Sherlynn Stalls, given his continued symptoms, would check a CT scan of the abdomen / pelvis with contrast to reassess his Crohn's if you can help order. If he isn't already on prednisone (had been on budesonide), would switch him to 53m prednisone po / day for 2 weeks, and then a slow taper by 548m/ week decrease until done. If worsening despite this in the interim please let me know. Can you let me know otherwise if he is agreeable to the prednisone? Thanks

## 2018-12-31 NOTE — Telephone Encounter (Signed)
Okay thanks for letting me know. If he worsens despite his current regimen however or fails to improve he needs to let us know. Will await CT scan otherwise. Thanks

## 2018-12-31 NOTE — Telephone Encounter (Signed)
Called patient and spoke to him and his wife. Agreed to CT. Scheduled on 01/09/19 to arrive at 1:15pm and be NPO 4 hours before except for the contrast. To pick-up the 2 bottles of oral contrast before 01/09/19 at [facility] radiology, and drink the 1st bottle at 11:30am and the 2nd bottle at 12:30pm the day of the CT. Also to come to our lab and have a BMET done before 01/09/19 to check his creatine. He does not want to change from Budesonide to Prednisone. He said he paid $80 for the full course of Budesonide and doesn't want to change

## 2019-01-02 ENCOUNTER — Other Ambulatory Visit (INDEPENDENT_AMBULATORY_CARE_PROVIDER_SITE_OTHER): Payer: PPO

## 2019-01-02 DIAGNOSIS — K50012 Crohn's disease of small intestine with intestinal obstruction: Secondary | ICD-10-CM

## 2019-01-02 LAB — BASIC METABOLIC PANEL
BUN: 13 mg/dL (ref 6–23)
CO2: 27 mEq/L (ref 19–32)
Calcium: 8.9 mg/dL (ref 8.4–10.5)
Chloride: 101 mEq/L (ref 96–112)
Creatinine, Ser: 0.9 mg/dL (ref 0.40–1.50)
GFR: 80.21 mL/min (ref >60.00)
Glucose, Bld: 85 mg/dL (ref 70–99)
Potassium: 3.7 mEq/L (ref 3.5–5.1)
Sodium: 138 mEq/L (ref 135–145)

## 2019-01-09 ENCOUNTER — Other Ambulatory Visit: Payer: Self-pay

## 2019-01-09 ENCOUNTER — Ambulatory Visit (HOSPITAL_COMMUNITY)
Admission: RE | Admit: 2019-01-09 | Discharge: 2019-01-09 | Disposition: A | Discharge status: Home / Self Care | Payer: PPO | Source: Ambulatory Visit | Attending: Gastroenterology | Admitting: Gastroenterology

## 2019-01-09 DIAGNOSIS — K5090 Crohn's disease, unspecified, without complications: Secondary | ICD-10-CM | POA: Diagnosis not present

## 2019-01-09 DIAGNOSIS — K4090 Unilateral inguinal hernia, without obstruction or gangrene, not specified as recurrent: Secondary | ICD-10-CM | POA: Diagnosis not present

## 2019-01-09 DIAGNOSIS — K50012 Crohn's disease of small intestine with intestinal obstruction: Secondary | ICD-10-CM

## 2019-01-09 MED ORDER — SODIUM CHLORIDE (PF) 0.9 % IJ SOLN
INTRAMUSCULAR | Status: AC
  Filled 2019-01-09: qty 50

## 2019-01-09 MED ORDER — IOHEXOL 300 MG/ML  SOLN
100.0000 mL | Freq: Once | INTRAMUSCULAR | Status: AC | PRN
  Administered 2019-01-09: 14:00:00 100 mL via INTRAVENOUS

## 2019-01-14 ENCOUNTER — Other Ambulatory Visit: Payer: Self-pay

## 2019-01-14 ENCOUNTER — Encounter: Payer: Self-pay | Admitting: Gastroenterology

## 2019-01-14 DIAGNOSIS — K50012 Crohn's disease of small intestine with intestinal obstruction: Secondary | ICD-10-CM

## 2019-01-16 ENCOUNTER — Telehealth: Payer: Self-pay | Admitting: Gastroenterology

## 2019-01-17 NOTE — Telephone Encounter (Signed)
Called and spoke to wife and let her know Dr. Havery Moros said her husband can get the COVID vaccine now, as long as he is feeling well

## 2019-01-17 NOTE — Telephone Encounter (Signed)
He is cleared to get the COVID vaccine now, as soon as he can, if he feels well otherwise. Thanks

## 2019-01-17 NOTE — Telephone Encounter (Signed)
Should patient wait a few days or so after his colonoscopy, to feel back to normal, before getting his COVID vaccine ?  Please see below

## 2019-01-22 ENCOUNTER — Other Ambulatory Visit: Payer: Self-pay | Admitting: Gastroenterology

## 2019-01-22 ENCOUNTER — Ambulatory Visit (INDEPENDENT_AMBULATORY_CARE_PROVIDER_SITE_OTHER): Payer: Medicare Other

## 2019-01-22 DIAGNOSIS — Z1159 Encounter for screening for other viral diseases: Secondary | ICD-10-CM | POA: Diagnosis not present

## 2019-01-22 LAB — SARS CORONAVIRUS 2 (TAT 6-24 HRS): SARS Coronavirus 2: NEGATIVE

## 2019-01-24 ENCOUNTER — Ambulatory Visit (AMBULATORY_SURGERY_CENTER): Payer: Medicare Other | Admitting: Gastroenterology

## 2019-01-24 ENCOUNTER — Encounter: Payer: Self-pay | Admitting: Gastroenterology

## 2019-01-24 ENCOUNTER — Telehealth: Payer: Self-pay | Admitting: Gastroenterology

## 2019-01-24 ENCOUNTER — Other Ambulatory Visit: Payer: Self-pay

## 2019-01-24 VITALS — BP 141/81 | HR 71 | Temp 97.6°F | Resp 14 | Ht 71.0 in | Wt 188.0 lb

## 2019-01-24 DIAGNOSIS — D122 Benign neoplasm of ascending colon: Secondary | ICD-10-CM | POA: Diagnosis not present

## 2019-01-24 DIAGNOSIS — D127 Benign neoplasm of rectosigmoid junction: Secondary | ICD-10-CM

## 2019-01-24 DIAGNOSIS — Z1211 Encounter for screening for malignant neoplasm of colon: Secondary | ICD-10-CM | POA: Diagnosis not present

## 2019-01-24 DIAGNOSIS — K9189 Other postprocedural complications and disorders of digestive system: Secondary | ICD-10-CM | POA: Diagnosis not present

## 2019-01-24 DIAGNOSIS — D123 Benign neoplasm of transverse colon: Secondary | ICD-10-CM | POA: Diagnosis not present

## 2019-01-24 DIAGNOSIS — K50012 Crohn's disease of small intestine with intestinal obstruction: Secondary | ICD-10-CM | POA: Diagnosis not present

## 2019-01-24 DIAGNOSIS — D125 Benign neoplasm of sigmoid colon: Secondary | ICD-10-CM | POA: Diagnosis not present

## 2019-01-24 DIAGNOSIS — D124 Benign neoplasm of descending colon: Secondary | ICD-10-CM | POA: Diagnosis not present

## 2019-01-24 MED ORDER — SODIUM CHLORIDE 0.9 % IV SOLN: 500.0000 mL | INTRAVENOUS | Status: DC

## 2019-01-24 NOTE — Patient Instructions (Signed)
Continue Budesonide taper.   YOU HAD AN ENDOSCOPIC PROCEDURE TODAY AT [facility]:   Refer to the procedure report that was given to you for any specific questions about what was found during the examination.  If the procedure report does not answer your questions, please call your gastroenterologist to clarify.  If you requested that your care partner not be given the details of your procedure findings, then the procedure report has been included in a sealed envelope for you to review at your convenience later.  YOU SHOULD EXPECT: Some feelings of bloating in the abdomen. Passage of more gas than usual.  Walking can help get rid of the air that was put into your GI tract during the procedure and reduce the bloating. If you had a lower endoscopy (such as a colonoscopy or flexible sigmoidoscopy) you may notice spotting of blood in your stool or on the toilet paper. If you underwent a bowel prep for your procedure, you may not have a normal bowel movement for a few days.  Please Note:  You might notice some irritation and congestion in your nose or some drainage.  This is from the oxygen used during your procedure.  There is no need for concern and it should clear up in a day or so.  SYMPTOMS TO REPORT IMMEDIATELY:   Following lower endoscopy (colonoscopy or flexible sigmoidoscopy):  Excessive amounts of blood in the stool  Significant tenderness or worsening of abdominal pains  Swelling of the abdomen that is new, acute  Fever of 100F or higher  For urgent or emergent issues, a gastroenterologist can be reached at any hour by calling [phone].   DIET:  We do recommend a small meal at first, but then you may proceed to your regular diet.  Drink plenty of fluids but you should avoid alcoholic beverages for 24 hours.  ACTIVITY:  You should plan to take it easy for the rest of today and you should NOT DRIVE or use heavy machinery until tomorrow (because of the sedation  medicines used during the test).    FOLLOW UP: Our staff will call the number listed on your records 48-72 hours following your procedure to check on you and address any questions or concerns that you may have regarding the information given to you following your procedure. If we do not reach you, we will leave a message.  We will attempt to reach you two times.  During this call, we will ask if you have developed any symptoms of COVID 19. If you develop any symptoms (ie: fever, flu-like symptoms, shortness of breath, cough etc.) before then, please call [phone].  If you test positive for Covid 19 in the 2 weeks post procedure, please call and report this information to [place].    If any biopsies were taken you will be contacted by phone or by letter within the next 1-3 weeks.  Please call us at [phone] if you have not heard about the biopsies in 3 weeks.    SIGNATURES/CONFIDENTIALITY: You and/or your care partner have signed paperwork which will be entered into your electronic medical record.  These signatures attest to the fact that that the information above on your After Visit Summary has been reviewed and is understood.  Full responsibility of the confidentiality of this discharge information lies with you and/or your care-partner.

## 2019-01-24 NOTE — Op Note (Signed)
[facility] Patient Name: Michael [name] Procedure Date: 01/24/2019 10:58 AM MRN: [id number] Endoscopist: Remo Lipps P. Havery Moros , MD Age: 86 Referring MD:  Date of Birth: October 29, 1932 Gender: Male Account #: [id number] Procedure:                Colonoscopy Indications:              Follow-up of Crohn's disease of the small bowel -                            ileal disease s/p surgery, recent flare treated                            with budesonide. CT shows mild ileitis, ?                            stricturing. Colonoscopy to further evaluate. Medicines:                Monitored Anesthesia Care Procedure:                Pre-Anesthesia Assessment:                           - Prior to the procedure, a History and Physical                            was performed, and patient medications and                            allergies were reviewed. The patient's tolerance of                            previous anesthesia was also reviewed. The risks                            and benefits of the procedure and the sedation                            options and risks were discussed with the patient.                            All questions were answered, and informed consent                            was obtained. Prior Anticoagulants: The patient has                            taken no previous anticoagulant or antiplatelet                            agents. ASA Grade Assessment: II - A patient with                            mild systemic disease. After reviewing the risks  and benefits, the patient was deemed in                            satisfactory condition to undergo the procedure.                           After obtaining informed consent, the colonoscope                            was passed under direct vision. Throughout the                            procedure, the patient's blood pressure, pulse, and                            oxygen saturations  were monitored continuously. The                            Colonoscope was introduced through the anus and                            advanced to the the terminal ileum. The colonoscopy                            was performed without difficulty. The patient                            tolerated the procedure well. The quality of the                            bowel preparation was fair. The terminal ileum and                            the rectum, surgical anastomosis were photographed. Scope In: 11:23:00 AM Scope Out: 12:17:48 PM Scope Withdrawal Time: 0 hours 40 minutes 56 seconds  Total Procedure Duration: 0 hours 54 minutes 48 seconds  Findings:                 The perianal and digital rectal examinations were                            normal.                           Diffuse inflammation, graded as Rutgeerts Score i2                            (more than five aphthous lesions with normal                            intervening mucosa or skip areas of larger lesions                            or lesions confined to the ileocolonic anastomosis)  and characterized by erosions, erythema and                            friability was found in the terminal ileum.                           There was evidence of a prior end-to-side                            ileo-colonic anastomosis in the ascending colon.                            This was patent and was characterized by mild                            stenosis, easily traversable with the pediatric                            colonoscope. Biopsies were taken with a cold                            forceps for histology.                           An mucosa was found in the ascending colon, a few                            folds back from the anastomosis, with altered                            vascularity. I suspect this may just represent                            inflammatory changes but biopsies were taken with  a                            cold forceps for histology rule out adenomatous                            change.                           Two sessile polyps were found in the ascending                            colon. The polyps were 3 to 6 mm in size. These                            polyps were removed with a cold snare. Resection                            and retrieval were complete.  Eight sessile polyps were found in the transverse                            colon. The polyps were 4 to 12 mm in size (multiple                            > 1cm). These polyps were removed with a cold                            snare. Resection and retrieval were complete.                           A large polyp was found in the transverse colon.                            The polyp was flat, laterally spreading, a few cms                            in size. Area distal to the lesion was tattooed                            with an injection of Spot (carbon black). It was                            not removed today given fair prep, colonic spasm,                            and the length of the case removing other polyps.                           A 10 mm polyp was found in the splenic flexure. The                            polyp was sessile. The polyp was removed with a                            cold snare. Resection and retrieval were complete.                           A 10 mm polyp was found in the descending colon.                            The polyp was sessile. The polyp was removed with a                            cold snare. Resection and retrieval were complete.                           Five sessile polyps were found in the sigmoid  colon. The polyps were 5 to 10 mm in size (multiple                            > 1cm in size). These polyps were removed with a                            cold snare. Resection and retrieval were complete.                            Two sessile polyps were found in the recto-sigmoid                            colon. The polyps were 4 mm in size. These polyps                            were removed with a cold snare. Resection and                            retrieval were complete.                           Scattered medium-mouthed diverticula were found in                            the entire colon.                           A large amount of liquid stool was found in the                            entire colon, making visualization difficult.                            Lavage was performed using copious amounts of                            sterile water, resulting in incomplete clearance                            with fair visualization.                           There was spasm in the entire colon.                           The exam was otherwise without abnormality. Complications:            No immediate complications. Estimated blood loss:                            Minimal. Estimated Blood Loss:     Estimated blood loss was minimal. Impression:               - Preparation of the colon was fair.                           -  Crohn's disease with mild ileitis.                           - Patent end-to-side ileo-colonic anastomosis,                            characterized by mild stenosis. Biopsied.                           - Abnormal mucosa in the ascending colon. Biopsied.                           - Two 3 to 6 mm polyps in the ascending colon,                            removed with a cold snare. Resected and retrieved.                           - Eight 4 to 12 mm polyps in the transverse colon,                            removed with a cold snare. Resected and retrieved.                           - One large polyp in the transverse colon.                            Tattooed. Not removed as outlined above                           - One 10 mm polyp at the splenic flexure, removed                             with a cold snare. Resected and retrieved.                           - One 10 mm polyp in the descending colon, removed                            with a cold snare. Resected and retrieved.                           - Five 5 to 10 mm polyps in the sigmoid colon,                            removed with a cold snare. Resected and retrieved.                           - Two 4 mm polyps at the recto-sigmoid colon,                            removed with a cold snare. Resected and retrieved.                           -  Diverticulosis in the entire examined colon.                           - Stool in the entire examined colon.                           - Colonic spasm.                           - The examination was otherwise normal. Recommendation:           - Patient has a contact number available for                            emergencies. The signs and symptoms of potential                            delayed complications were discussed with the                            patient. Return to normal activities tomorrow.                            Written discharge instructions were provided to the                            patient.                           - Resume previous diet.                           - Continue present medications.                           - Await pathology results.                           - Continue budesonide taper                           - Will discuss management options with the patient,                            no high grade stenosis on this exam. Carlota Raspberry. Byrne Capek, MD 01/24/2019 12:36:13 PM This report has been signed electronically.

## 2019-01-24 NOTE — Progress Notes (Signed)
Temp LC V/S CW

## 2019-01-24 NOTE — Telephone Encounter (Signed)
Patients wife wanted to let us know that patient followed all directions for his prep. Dr. Havery Moros mentioned stool still in colon. She also said he was going to need another colonoscopy and thought maybe he may need a different prep if he was not cleaned out.

## 2019-01-24 NOTE — Progress Notes (Signed)
Pt tolerated well. VSS. Awake and to recovery. 

## 2019-01-25 ENCOUNTER — Telehealth: Payer: Self-pay | Admitting: Gastroenterology

## 2019-01-25 NOTE — Telephone Encounter (Signed)
Called patient and let him know Dr. Havery Moros  Doesn't want them to worry about the inadequate prep. That it may take a little extra next time, but he will be in touch

## 2019-01-25 NOTE — Telephone Encounter (Signed)
Sometime it can happen and I don't want her to worry to much about this. He will likely need to drink a bit extra prep for his next exam which should work okay. I'll get back to them about timing. Thanks

## 2019-01-25 NOTE — Telephone Encounter (Signed)
Patient's wife called and wanted Dr. Havery Moros to know that they followed the prep for his colonoscopy exactly  by the instructions and isn't sure why her husband wasn't cleaned out. She is concerned about the future colonoscopy at the hospital that he will be cleaned out enough.

## 2019-01-28 ENCOUNTER — Telehealth: Payer: Self-pay

## 2019-01-28 NOTE — Telephone Encounter (Signed)
1. Have you developed a fever since your procedure? No  2.   Have you had an respiratory symptoms (SOB or cough) since your procedure? No  3.   Have you tested positive for COVID 19 since your procedure No  4.   Have you had any family members/close contacts diagnosed with the COVID 19 since your procedure?  No   If yes to any of these questions please route to Joylene John, RN and Alphonsa Gin, RN.   Follow up Call-  Call back number 01/24/2019  Post procedure Call Back phone  # [phone]  Permission to leave phone message Yes  Some recent data might be hidden     Patient questions:  Do you have a fever, pain , or abdominal swelling? No. Pain Score  0 *  Have you tolerated food without any problems? Yes.    Have you been able to return to your normal activities? Yes.    Do you have any questions about your discharge instructions: Diet   No. Medications  No. Follow up visit  No.  Do you have questions or concerns about your Care? No.  Actions: * If pain score is 4 or above: No action needed, pain <4.

## 2019-02-02 ENCOUNTER — Other Ambulatory Visit: Payer: Self-pay | Admitting: Gastroenterology

## 2019-02-03 ENCOUNTER — Other Ambulatory Visit: Payer: Self-pay

## 2019-02-04 ENCOUNTER — Other Ambulatory Visit: Payer: Self-pay

## 2019-02-04 ENCOUNTER — Telehealth: Payer: Self-pay | Admitting: Gastroenterology

## 2019-02-04 MED ORDER — BUDESONIDE 3 MG PO CPEP: 3.0000 mg | ORAL_CAPSULE | Freq: Every day | ORAL | 0 refills | Status: DC

## 2019-02-04 NOTE — Telephone Encounter (Signed)
Called patient. LM to call back to discuss refill request for budesonide taper.

## 2019-02-04 NOTE — Telephone Encounter (Signed)
Called patient's wife and let her know  Dr. Havery Moros wants patient to just finish the Budesonide taper as scheduled. I sent into their pharmacy the additional doses needed. And that Dr. Havery Moros is going to present the patient's case in a multi-D IBD meeting tomorrow, and will let them know if there is any change in the plans

## 2019-02-04 NOTE — Telephone Encounter (Signed)
I would continue the taper as scheduled for now and we will see how he does. I am presenting his case at the multi-D IBD meeting tomorrow and will let them know if any change in the plan in the interim. Thanks

## 2019-02-04 NOTE — Telephone Encounter (Signed)
Patient's wife called and said because of having her husband stay on the 91m dose of Budesonide longer, he will be out of them by 02/06/19 and still has 2 1/2 weeks left of his taper. I can send in the extra doses to finish, but his wife is asking with having his Colonoscopy on March 15th now, should he stay on the Budesonide until then. Please advise.

## 2019-02-04 NOTE — Telephone Encounter (Signed)
Patient's wife is calling in reference to refill on Budesonide 73m she is also asking about directions on how to take.

## 2019-02-05 ENCOUNTER — Other Ambulatory Visit: Payer: Self-pay

## 2019-02-05 ENCOUNTER — Telehealth: Payer: Self-pay

## 2019-02-05 DIAGNOSIS — K635 Polyp of colon: Secondary | ICD-10-CM

## 2019-02-05 NOTE — Telephone Encounter (Signed)
Scheduled patient for colonoscopy w/EMR at [facility] on 03/25/19. Patient to arrive at 10:30am. COVID testing on 03/21/19 at 11:00am at [address] And to get in the [address]. Pre-visit on 03/18/19 at 10:45am and to get a DOUBLE prep. Called patient's wife and gave her all the above information

## 2019-02-05 NOTE — Telephone Encounter (Signed)
Patient received script for 24 tablets to finish taper according to Dr. Havery Moros (sent by Sherlynn Stalls)

## 2019-02-15 ENCOUNTER — Telehealth: Payer: Self-pay | Admitting: Gastroenterology

## 2019-02-15 NOTE — Telephone Encounter (Signed)
Pt is scheduled for a hosp procedure and requested to have covid test at [facility].

## 2019-02-15 NOTE — Telephone Encounter (Signed)
Called patient back and let him know the COVID test can only be done at the [facility]. For a hospital procedures.

## 2019-02-18 ENCOUNTER — Telehealth: Payer: Self-pay | Admitting: Gastroenterology

## 2019-02-18 NOTE — Telephone Encounter (Signed)
Thanks Customer service manager. I was actually going to reach out to them this week, so this call is timely. We discussed his case. We don't recommend surgery right now given he has been doing better. We do think that medical therapy for his Crohn's would be a good idea to prevent worsening. Given his age, Weyman Rodney would be the best / safest option for him if they can afford it. Can you touch base with his insurance to see how much Weyman Rodney would cost for them? Otherwise, I will plan on removing the large polyp in March, and evaluate the right colon abnormal biopsy showing adenoma with a special dye (chromoendoscopy) to see if we can better delineate that polyp.  Once we have the insurance quote on the [facility] we can see how they want to proceed. Thank you

## 2019-02-18 NOTE — Telephone Encounter (Signed)
Patient's wife called and states Dr. Havery Moros had mentioned Entyvio to her after patient's colonoscopy. She wanted to know if that is something he is wanting to pursue. She also wanted to know what he found out when he met with the committee, discussing her husband's case.

## 2019-02-18 NOTE — Telephone Encounter (Signed)
Patients wife calling- She stated that Dr. Havery Moros was supposed to be calling patients insurance to see about Entyvio. She is asking where they are with that. Also wants to talk to you about the prep in March.

## 2019-02-19 ENCOUNTER — Telehealth: Payer: Self-pay

## 2019-02-19 NOTE — Telephone Encounter (Signed)
I called patient's Ins. Co. And was not able to get an answer on coverage for Entyvio. Sent request to [facility] to see if she could get more information on it.

## 2019-02-20 ENCOUNTER — Telehealth: Payer: Self-pay

## 2019-02-20 NOTE — Telephone Encounter (Signed)
See other phone note

## 2019-02-20 NOTE — Telephone Encounter (Signed)
See below

## 2019-02-20 NOTE — Telephone Encounter (Signed)
Patient's wife called back, and I let her know the committee that Dr. Havery Moros reviewed patient's crohn's case with, does not recommend surgery. They instead recommend medical therapy. Also heard back from [facility] on the cost of Entyvio to the patient. The cost would be $2,000 per infusion. I let the wife know, and they do not want to pay that. She wants to know what is the alternative. Patient took his last Budesonide today

## 2019-02-20 NOTE — Telephone Encounter (Signed)
Pt wife returning call.

## 2019-02-20 NOTE — Telephone Encounter (Signed)
Okay. Thanks for the update, that is much too expensive unfortunately. Can you check on how much Humira would cost through his insurance, and once I have that back I'll call him to discuss it. If he doesn't wish to have medical therapy I understand but am concerned about his risk for future problems. Thanks

## 2019-02-20 NOTE — Telephone Encounter (Signed)
-----   Message from Darden Dates sent at 02/20/2019  9:38 AM EST ----- Marliss Coots, I just had a moment to get to this one.  I called his insurance company.  They only cover 80%.  This drug is approx $10,000. (it's a crazy expensive drug) His portion would be about $2000 per infusion. No auth req per Nas M @ [facility] Medicare. Ref# [id number] Thank you, Amy ----- Message ----- From: Hughie Closs, RN Sent: 02/18/2019   4:00 PM EST To: [place]  Hi Amy,  Could you please find out if Weyman Rodney is covered for this patient ? And if so what he out of pocket would be. Denna Haggard

## 2019-02-21 NOTE — Telephone Encounter (Signed)
Coverage would be the same for Humira.  Plan pays 80%.  Humira is a few hundred dollars compared to [place] being thousands. So an approx cost would be $60-$100 per injection.  If this therapy is selected you will need to process with [facility]. Thanks, Amy

## 2019-02-21 NOTE — Telephone Encounter (Signed)
I called the patient, we discussed the situation.  I presented his case at the multidisciplinary IBD conference.  The consensus was to avoid surgery if at all possible given his advanced age.  If he was amenable to being on a medical therapy, biologic, hopefully that would minimize his risk for having recurrent obstruction and hospitalization.  Entyvio ideally at his age is the best option however he cannot afford it given the cost as outlined.  We discussed Humira, he is also concerned about the cost of that.  We also discussed risks of Humira to include infection etc.  He has a lot of reservations about starting biologic therapy in light of the risks and cost.  He wants to hold off for now and discuss further with his wife.  I am going to see him for a follow-up colonoscopy at the hospital in March.  We will plan on removing a large polyp from his transverse colon at that time, we will also plan on further evaluating the polypoid lesion in the right colon as well to better discern the size of that lesion.  He understands risks of removing the large polyp to include bleeding and perforation.  He was agreeable with the plan, he is currently feeling well without any complaints.  He will contact me in the interim if he has any issues.  All questions answered he agreed.

## 2019-03-04 ENCOUNTER — Telehealth: Payer: Self-pay | Admitting: Gastroenterology

## 2019-03-04 MED ORDER — BUDESONIDE 3 MG PO CPEP: 9.0000 mg | ORAL_CAPSULE | Freq: Every day | ORAL | 0 refills | Status: DC

## 2019-03-04 NOTE — Telephone Encounter (Signed)
He should go to a liquid diet for a few days, and let's place him back on budesonide 57m/ day for 4 weeks, then 641m/ day for 2 weeks, then 59m61m day for 2 weeks and then done. If no better he should contact me. Thanks

## 2019-03-04 NOTE — Telephone Encounter (Signed)
Spoke with pts wife and she is aware. Script sent to pharmacy for pt.

## 2019-03-04 NOTE — Telephone Encounter (Signed)
Wife states pt had colon done recently and Dr. Havery Moros told him if he had any "tighness" to let him know. State pt had a little tightness Thursday but Sunday there was a little more tightness. Reports today it is a little more sore and tight. Pt wants to know if he needs to go back on budesonide. Please advise.

## 2019-03-10 ENCOUNTER — Other Ambulatory Visit: Payer: Self-pay

## 2019-03-10 ENCOUNTER — Encounter (HOSPITAL_BASED_OUTPATIENT_CLINIC_OR_DEPARTMENT_OTHER): Payer: Self-pay

## 2019-03-10 ENCOUNTER — Emergency Department (HOSPITAL_BASED_OUTPATIENT_CLINIC_OR_DEPARTMENT_OTHER)
Admission: EM | Admit: 2019-03-10 | Discharge: 2019-03-10 | Disposition: A | Discharge status: Home / Self Care | Payer: Medicare Other | Attending: Emergency Medicine | Admitting: Emergency Medicine

## 2019-03-10 ENCOUNTER — Emergency Department (HOSPITAL_BASED_OUTPATIENT_CLINIC_OR_DEPARTMENT_OTHER): Payer: Medicare Other

## 2019-03-10 DIAGNOSIS — Z87891 Personal history of nicotine dependence: Secondary | ICD-10-CM | POA: Diagnosis not present

## 2019-03-10 DIAGNOSIS — R1013 Epigastric pain: Secondary | ICD-10-CM | POA: Insufficient documentation

## 2019-03-10 DIAGNOSIS — Z79899 Other long term (current) drug therapy: Secondary | ICD-10-CM | POA: Diagnosis not present

## 2019-03-10 DIAGNOSIS — R079 Chest pain, unspecified: Secondary | ICD-10-CM | POA: Diagnosis not present

## 2019-03-10 DIAGNOSIS — R0789 Other chest pain: Secondary | ICD-10-CM | POA: Diagnosis present

## 2019-03-10 LAB — COMPREHENSIVE METABOLIC PANEL
ALT: 17 U/L (ref 0–44)
AST: 20 U/L (ref 15–41)
Albumin: 3.9 g/dL (ref 3.5–5.0)
Alkaline Phosphatase: 47 U/L (ref 38–126)
Anion gap: 9 (ref 5–15)
BUN: 14 mg/dL (ref 8–23)
CO2: 28 mmol/L (ref 22–32)
Calcium: 9.1 mg/dL (ref 8.9–10.3)
Chloride: 101 mmol/L (ref 98–111)
Creatinine, Ser: 0.84 mg/dL (ref 0.61–1.24)
GFR calc Af Amer: >60 mL/min (ref >60)
GFR calc non Af Amer: >60 mL/min (ref >60)
Glucose, Bld: 91 mg/dL (ref 70–99)
Potassium: 3.2 mmol/L — ABNORMAL LOW (ref 3.5–5.1)
Sodium: 138 mmol/L (ref 135–145)
Total Bilirubin: 1 mg/dL (ref 0.3–1.2)
Total Protein: 7.2 g/dL (ref 6.5–8.1)

## 2019-03-10 LAB — PROTIME-INR
INR: 1 (ref 0.8–1.2)
Prothrombin Time: 12.9 seconds (ref 11.4–15.2)

## 2019-03-10 LAB — CBC WITH DIFFERENTIAL/PLATELET
Abs Immature Granulocytes: 0.17 10*3/uL — ABNORMAL HIGH (ref 0.00–0.07)
Basophils Absolute: 0.1 10*3/uL (ref 0.0–0.1)
Basophils Relative: 1 %
Eosinophils Absolute: 0.1 10*3/uL (ref 0.0–0.5)
Eosinophils Relative: 1 %
HCT: 39.2 % (ref 39.0–52.0)
Hemoglobin: 13.4 g/dL (ref 13.0–17.0)
Immature Granulocytes: 2 %
Lymphocytes Relative: 18 %
Lymphs Abs: 2.1 10*3/uL (ref 0.7–4.0)
MCH: 32.1 pg (ref 26.0–34.0)
MCHC: 34.2 g/dL (ref 30.0–36.0)
MCV: 94 fL (ref 80.0–100.0)
Monocytes Absolute: 0.7 10*3/uL (ref 0.1–1.0)
Monocytes Relative: 6 %
Neutro Abs: 8.6 10*3/uL — ABNORMAL HIGH (ref 1.7–7.7)
Neutrophils Relative %: 72 %
Platelets: 239 10*3/uL (ref 150–400)
RBC: 4.17 MIL/uL — ABNORMAL LOW (ref 4.22–5.81)
RDW: 13.1 % (ref 11.5–15.5)
WBC: 11.7 10*3/uL — ABNORMAL HIGH (ref 4.0–10.5)
nRBC: 0 % (ref 0.0–0.2)

## 2019-03-10 LAB — URINALYSIS, ROUTINE W REFLEX MICROSCOPIC
Bilirubin Urine: NEGATIVE
Glucose, UA: NEGATIVE mg/dL
Hgb urine dipstick: NEGATIVE
Ketones, ur: NEGATIVE mg/dL
Leukocytes,Ua: NEGATIVE
Nitrite: NEGATIVE
Protein, ur: NEGATIVE mg/dL
Specific Gravity, Urine: <1.005 — ABNORMAL LOW (ref 1.005–1.030)
pH: 6 (ref 5.0–8.0)

## 2019-03-10 LAB — TROPONIN I (HIGH SENSITIVITY)
Troponin I (High Sensitivity): 11 ng/L (ref <18)
Troponin I (High Sensitivity): 12 ng/L (ref <18)

## 2019-03-10 LAB — LIPASE, BLOOD: Lipase: 24 U/L (ref 11–51)

## 2019-03-10 MED ORDER — FAMOTIDINE IN NACL 20-0.9 MG/50ML-% IV SOLN
20.0000 mg | Freq: Once | INTRAVENOUS | Status: AC
  Administered 2019-03-10: 20 mg via INTRAVENOUS
  Filled 2019-03-10: qty 50

## 2019-03-10 MED ORDER — FAMOTIDINE 20 MG PO TABS: 20.0000 mg | ORAL_TABLET | Freq: Two times a day (BID) | ORAL | 0 refills | Status: DC

## 2019-03-10 NOTE — ED Provider Notes (Signed)
[facility] EMERGENCY DEPARTMENT Provider Note   CSN: [id number] Arrival date & time: 03/10/19  1022     History Chief Complaint  Patient presents with  . Chest Pain    Michael [name] is a 86 y.o. male.  HPI Patient reports that he had eaten breakfast at [facility] this morning.  He had eggs and sausage and grits and toast and coffee.  He was just pulling out in his car starting to go home when he suddenly got very painful sensation in his upper abdomen and lower chest that was going through to his back.  He reports they were really severe spasmodic pains and it made him feel kind of short of breath because it was so painful.  Is started to ease a little bit as he was driving.  He then got a second episode of pain that rebounded.  He denies he had any nausea or vomiting.  He did not feel dizzy or like he would pass out.  Once he got home, the symptoms were significantly improved.  He presents to the emergency department for further evaluation.  He denies he has had similar episodes.  He reports he exercises and plays tennis and does not get any chest pain with exertion.  He does not feel he gets short of breath with exertion.  This was a new and different experience for him.  Reports he does have Crohn's disease and sometimes gets pain in his abdomen that is a little lower and more in the periumbilical area.    Past Medical History:  Diagnosis Date  . Adrenal insufficiency ([place])   . Anemia   . Avascular necrosis ([place])   . Crohn's disease ([place])   . Glaucoma   . Inguinal hernia recurrent bilateral   . Low testosterone   . Osteopenia   . Retinal ischemia     Patient Active Problem List   Diagnosis Date Noted  . Asymptomatic varicose veins of left lower extremity 12/16/2017  . Hyperglycemia 11/02/2017  . 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 Clavicle fracture 11/02/2017  . Vitamin D deficiency 11/02/2017  . Leg DVT (deep venous thromboembolism), acute, left ([place]) 08/08/2017   . SBO (small bowel obstruction) ([place]) 07/05/2017  . Bilateral hearing loss 05/31/2017  . B12 deficiency 02/06/2017  . Osteopenia 02/06/2017  . Glaucoma 12/12/2015  . Crohn's disease ([place]) 12/12/2015  . Anemia 12/12/2015  . 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 ABDOMINAL SURGERY    . COLONOSCOPY N/A 12/13/2015   Procedure: COLONOSCOPY;  Surgeon: Laurence Spates, MD;  Location: [facility];  Service: Endoscopy;  Laterality: N/A;  . INGUINAL HERNIA REPAIR Bilateral 05/21/2014   Procedure: OPEN BILATERAL INGUINAL HERNIA REPAIRS WITH MESH;  Surgeon: Donnie Mesa, MD;  Location: [facility];  Service: General;  Laterality: Bilateral;  . SMALL INTESTINE SURGERY  1968, 2001    related to Chrohn's disease  . TONSILLECTOMY         Family History  Problem Relation Age of Onset  . Deep vein thrombosis Mother   . Heart disease Father   . Stomach cancer Neg Hx   . Colon cancer Neg Hx   . Esophageal cancer Neg Hx   . Pancreatic cancer Neg Hx     Social History   Tobacco Use  . Smoking status: Former Smoker    Quit date: 04/01/1976    Years since quitting: 42.9  . Smokeless tobacco: Never Used  Substance Use Topics  . Alcohol use: Yes    Comment: 2-3  glass wine  weekly  1-2 cans beer/ week  . Drug use: No    Home Medications Prior to Admission medications   Medication Sig Start Date End Date Taking? Authorizing Provider  augmented betamethasone dipropionate (DIPROLENE-AF) 0.05 % cream  10/03/17   [provider]  brimonidine (ALPHAGAN) 0.2 % ophthalmic solution Place 1 drop into both eyes 2 (two) times daily. 11/20/15   [provider]  budesonide (ENTOCORT EC) 3 MG 24 hr capsule Take 1 capsule (3 mg total) by mouth daily. Take 2 capsules a day for 5 more days, then take 1 capsule daily for 2 weeks, then done 02/04/19   Armbruster, Carlota Raspberry, MD  budesonide (ENTOCORT EC) 3 MG 24 hr capsule Take 3 capsules  (9 mg total) by mouth daily. Take 24m daily for 4 weeks, then 659mdaily for 2 weeks, then 3 mg daily for 2 weeks, then done 03/04/19   ArYetta FlockMD  Calcium Carbonate-Vit D-Min (CALCIUM 1200 PO) Take by mouth daily.    [provider]  Cholecalciferol (VITAMIN D) 2000 units CAPS Take 1 capsule (2,000 Units total) by mouth daily. 08/22/16   Armbruster, StCarlota RaspberryMD  cyanocobalamin (,VITAMIN B-12,) 1000 MCG/ML injection INJECT 1ML INTO THE MUSCLE EVERY 30 DAYS 10/10/18   BuBinnie RailMD  dorzolamide-timolol (COSOPT) 22.3-6.8 MG/ML ophthalmic solution Place 1 drop into both eyes 2 (two) times daily. 11/05/15   [provider]  famotidine (PEPCID) 20 MG tablet Take 1 tablet (20 mg total) by mouth 2 (two) times daily. 03/10/19   PfCharlesetta ShanksMD  IRON PO Take 325 mg by mouth 3 (three) times a week.     [provider]  latanoprost (XALATAN) 0.005 % ophthalmic solution  09/12/17   [provider]  Netarsudil Dimesylate (RHOPRESSA) 0.02 % SOLN Apply to eye.    [provider]  NON FORMULARY Anti Diarrhea    [provider]  omeprazole (PRILOSEC) 20 MG capsule Take 20 mg by mouth daily. Before breakfast    [provider]  prednisoLONE acetate (PRED FORTE) 1 % ophthalmic suspension  08/28/18   [provider]    Allergies    Patient has no known allergies.  Review of Systems   Review of Systems 10 Systems reviewed and are negative for acute change except as noted in the HPI.  Physical Exam Updated Vital Signs BP (!) 168/102 (BP Location: Right Arm)   Pulse (!) 40   Temp 98.4 F (36.9 C) (Oral)   Resp (!) 22   Ht 5' 11"  (1.803 m)   Wt 84.8 kg   SpO2 99%   BMI 26.08 kg/m   Physical Exam Constitutional:      Comments: Patient is alert and nontoxic.  Clinically well in appearance.  No respiratory distress.  HENT:     Head: Normocephalic and atraumatic.  Eyes:     Extraocular Movements: Extraocular movements  intact.  Cardiovascular:     Rate and Rhythm: Normal rate and regular rhythm.  Pulmonary:     Effort: Pulmonary effort is normal.     Breath sounds: Normal breath sounds.  Abdominal:     General: There is no distension.     Palpations: Abdomen is soft.     Tenderness: There is no abdominal tenderness. There is no guarding.  Musculoskeletal:        General: No swelling or tenderness. Normal  range of motion.     Right lower leg: No edema.     Left lower leg: No edema.  Skin:    General: Skin is warm and dry.  Neurological:     General: No focal deficit present.     Mental Status: He is oriented to person, place, and time.     Coordination: Coordination normal.  Psychiatric:        Mood and Affect: Mood normal.     ED Results / Procedures / Treatments   Labs (all labs ordered are listed, but only abnormal results are displayed) Labs Reviewed  COMPREHENSIVE METABOLIC PANEL - Abnormal; Notable for the following components:      Result Value   Potassium 3.2 (*)    All other components within normal limits  CBC WITH DIFFERENTIAL/PLATELET - Abnormal; Notable for the following components:   WBC 11.7 (*)    RBC 4.17 (*)    Neutro Abs 8.6 (*)    Abs Immature Granulocytes 0.17 (*)    All other components within normal limits  URINALYSIS, ROUTINE W REFLEX MICROSCOPIC - Abnormal; Notable for the following components:   Specific Gravity, Urine <1.005 (*)    All other components within normal limits  LIPASE, BLOOD  PROTIME-INR  TROPONIN I (HIGH SENSITIVITY)  TROPONIN I (HIGH SENSITIVITY)  TROPONIN I (HIGH SENSITIVITY)    EKG EKG Interpretation  Date/Time:  Sunday March 10 2019 10:31:38 EST Ventricular Rate:  75 PR Interval:    QRS Duration: 120 QT Interval:  405 QTC Calculation: 453 R Axis:   18 Text Interpretation: Sinus rhythm IVCD, consider atypical RBBB no change from previous Confirmed by Charlesetta Shanks [phone]) on 03/10/2019 11:09:24 AM   Radiology DG Chest Port  1 View  Result Date: 03/10/2019 CLINICAL DATA:  Acute chest pain. EXAM: PORTABLE CHEST 1 VIEW COMPARISON:  11/15/2015 FINDINGS: The cardiomediastinal silhouette is unremarkable. There is no evidence of focal airspace disease, pulmonary edema, suspicious pulmonary nodule/mass, pleural effusion, or pneumothorax. No acute bony abnormalities are identified. IMPRESSION: No active disease. Electronically Signed   By: Margarette Canada M.D.   On: 03/10/2019 12:03    Procedures Procedures (including critical care time)  Medications Ordered in ED Medications  famotidine (PEPCID) IVPB 20 mg premix (0 mg Intravenous Stopped 03/10/19 1253)    ED Course  I have reviewed the triage vital signs and the nursing notes.  Pertinent labs & imaging results that were available during my care of the patient were reviewed by me and considered in my medical decision making (see chart for details).    MDM Rules/Calculators/A&P                     Patient has sudden episode of asked epigastric and lower chest pain that radiated to his back.  He had just had a large meal prior to onset of symptoms.  He did not have associated symptoms highly suggestive of ischemic event.  He did not get nauseated or become diaphoretic or dizzy.  He reports he felt kind of short of breath but it was mostly because of the pain and it felt like it hurt to breathe while he was having the pain.  This has since abated.  Symptoms were resolved after Pepcid.  Patient had EKG unchanged from previous.  No acute ischemic appearance.  2 sets of negative troponins.  At this time, I have high suspicion for an episode of reflux or esophageal spasm.  However, given patient's age and  potential risk factors he is counseled on immediate return precautions as well as follow-up with his PCP for further evaluation for possible outpatient cardiac stress testing.  We will have him start Pepcid twice daily for 2 weeks and follow dietary instructions for GERD. Final  Clinical Impression(s) / ED Diagnoses Final diagnoses:  Epigastric pain    Rx / DC Orders ED Discharge Orders         Ordered    famotidine (PEPCID) 20 MG tablet  2 times daily     03/10/19 1332           Charlesetta Shanks, MD 03/10/19 1343

## 2019-03-10 NOTE — ED Triage Notes (Signed)
Pt reports he was driving home from breakfast today reports he started to having "hurting and sharp pains that went to the back". Pt also endorses feeling some SOB, denies NV, dizziness.

## 2019-03-10 NOTE — Discharge Instructions (Addendum)
1.  I suspect your upper abdominal and chest discomfort was due to an episode of reflux today.  Start Pepcid twice daily and follow dietary recommendations listed in your discharge instructions for gastroesophageal reflux disease. 2.  Although there is no sign that you had a heart attack today, chest pain can be a first sign of heart disease without abnormal EKG or heart labs.  You must return to the emergency department immediately if you have recurrence of symptoms or develop other concerning symptoms such as nausea, sweating, weakness or shortness of breath. 3.  Follow-up with your doctor this week and discuss your symptoms.  You may need further testing such as a stress test for your heart.

## 2019-03-11 ENCOUNTER — Telehealth: Payer: Self-pay | Admitting: Gastroenterology

## 2019-03-11 NOTE — Telephone Encounter (Signed)
He can take that but if he is already taking omeprazole 65m once daily I would recommend he increase it to BID and keep it simpler for him. Try for 2 weeks and see how he feels, would hold off on pepcid if he is doing this. Thanks

## 2019-03-11 NOTE — Telephone Encounter (Signed)
Patient's wife called stating the patient had a severe reflux over the weekend went to ED they suggested he take Pepcid and she would like to know if that is ok for him to take.

## 2019-03-11 NOTE — Telephone Encounter (Signed)
Called patient back and spoke to his wife. She said her husband has been taking the Omeprazole 4 times a week, that he would increase to daily first and if he still had reflux/heartburn that he would go to BID. That Dr. Havery Moros would like him to hold the Pepcid

## 2019-03-14 DIAGNOSIS — H401133 Primary open-angle glaucoma, bilateral, severe stage: Secondary | ICD-10-CM | POA: Diagnosis not present

## 2019-03-18 ENCOUNTER — Ambulatory Visit (AMBULATORY_SURGERY_CENTER): Payer: Self-pay | Admitting: *Deleted

## 2019-03-18 ENCOUNTER — Other Ambulatory Visit: Payer: Self-pay

## 2019-03-18 VITALS — Temp 97.6°F | Ht 71.0 in | Wt 186.0 lb

## 2019-03-18 DIAGNOSIS — Z01818 Encounter for other preprocedural examination: Secondary | ICD-10-CM

## 2019-03-18 DIAGNOSIS — Z860100 Personal history of colon polyps, unspecified: Secondary | ICD-10-CM

## 2019-03-18 NOTE — Progress Notes (Signed)
No egg or soy allergy known to patient  No issues with past sedation with any surgeries  or procedures, no intubation problems  No diet pills per patient No home 02 use per patient  No blood thinners per patient  Pt denies issues with constipation  No A fib or A flutter  EMMI video sent to pt's e mail   Due to the COVID-19 pandemic we are asking patients to follow these guidelines. Please only bring one care partner. Please be aware that your care partner may wait in the car in the parking lot or if they feel like they will be too hot to wait in the car, they may wait in the lobby on the 4th floor. All care partners are required to wear a mask the entire time (we do not have any that we can provide them), they need to practice social distancing, and we will do a Covid check for all patient's and care partners when you arrive. Also we will check their temperature and your temperature. If the care partner waits in their car they need to stay in the parking lot the entire time and we will call them on their cell phone when the patient is ready for discharge so they can bring the car to the front of the building. Also all patient's will need to wear a mask into building.

## 2019-03-18 NOTE — Progress Notes (Signed)
Provided Plenvu sample to patient  Lot [id number] exp 04/2019

## 2019-03-19 ENCOUNTER — Telehealth: Payer: Self-pay | Admitting: Gastroenterology

## 2019-03-19 ENCOUNTER — Encounter: Payer: Self-pay | Admitting: Gastroenterology

## 2019-03-19 DIAGNOSIS — Z86010 Personal history of colonic polyps: Secondary | ICD-10-CM

## 2019-03-19 MED ORDER — PLENVU 140 G PO SOLR: 1.0000 | Freq: Once | ORAL | 0 refills | Status: AC

## 2019-03-19 NOTE — Telephone Encounter (Signed)
Michael [name] called back to apologize. He did not realize that we gave him a free sample of Plenvue- he thought it was an empty box. Wonders if we need to call [facility] and cancel the prescription.

## 2019-03-19 NOTE — Telephone Encounter (Signed)
Pt stated that [facility] has not received Plenvu rx.  Please resend.

## 2019-03-19 NOTE — Telephone Encounter (Signed)
Plenvu rx resent to [facility]- pt made aware of this.

## 2019-03-21 ENCOUNTER — Other Ambulatory Visit (HOSPITAL_COMMUNITY)
Admission: RE | Admit: 2019-03-21 | Discharge: 2019-03-21 | Disposition: A | Discharge status: Home / Self Care | Payer: Medicare Other | Source: Ambulatory Visit | Attending: Gastroenterology | Admitting: Gastroenterology

## 2019-03-21 DIAGNOSIS — Z01812 Encounter for preprocedural laboratory examination: Secondary | ICD-10-CM | POA: Insufficient documentation

## 2019-03-21 DIAGNOSIS — Z20822 Contact with and (suspected) exposure to covid-19: Secondary | ICD-10-CM | POA: Insufficient documentation

## 2019-03-21 LAB — SARS CORONAVIRUS 2 (TAT 6-24 HRS): SARS Coronavirus 2: NEGATIVE

## 2019-03-25 ENCOUNTER — Other Ambulatory Visit: Payer: Self-pay

## 2019-03-25 ENCOUNTER — Telehealth: Payer: Self-pay | Admitting: Gastroenterology

## 2019-03-25 ENCOUNTER — Ambulatory Visit (HOSPITAL_COMMUNITY): Payer: Medicare Other | Admitting: Certified Registered Nurse Anesthetist

## 2019-03-25 ENCOUNTER — Encounter (HOSPITAL_COMMUNITY)
Admission: RE | Disposition: A | Discharge status: Home / Self Care | Payer: Self-pay | Source: Home / Self Care | Attending: Gastroenterology

## 2019-03-25 ENCOUNTER — Ambulatory Visit (HOSPITAL_COMMUNITY)
Admission: RE | Admit: 2019-03-25 | Discharge: 2019-03-25 | Disposition: A | Discharge status: Home / Self Care | Payer: Medicare Other | Attending: Gastroenterology | Admitting: Gastroenterology

## 2019-03-25 ENCOUNTER — Encounter (HOSPITAL_COMMUNITY): Payer: Self-pay | Admitting: Gastroenterology

## 2019-03-25 DIAGNOSIS — Z980 Intestinal bypass and anastomosis status: Secondary | ICD-10-CM | POA: Insufficient documentation

## 2019-03-25 DIAGNOSIS — Z86010 Personal history of colonic polyps: Secondary | ICD-10-CM | POA: Diagnosis not present

## 2019-03-25 DIAGNOSIS — Z8249 Family history of ischemic heart disease and other diseases of the circulatory system: Secondary | ICD-10-CM | POA: Insufficient documentation

## 2019-03-25 DIAGNOSIS — K5090 Crohn's disease, unspecified, without complications: Secondary | ICD-10-CM | POA: Diagnosis not present

## 2019-03-25 DIAGNOSIS — K589 Irritable bowel syndrome without diarrhea: Secondary | ICD-10-CM | POA: Diagnosis not present

## 2019-03-25 DIAGNOSIS — D649 Anemia, unspecified: Secondary | ICD-10-CM | POA: Diagnosis not present

## 2019-03-25 DIAGNOSIS — E2740 Unspecified adrenocortical insufficiency: Secondary | ICD-10-CM | POA: Insufficient documentation

## 2019-03-25 DIAGNOSIS — M8580 Other specified disorders of bone density and structure, unspecified site: Secondary | ICD-10-CM | POA: Diagnosis not present

## 2019-03-25 DIAGNOSIS — D123 Benign neoplasm of transverse colon: Secondary | ICD-10-CM | POA: Insufficient documentation

## 2019-03-25 DIAGNOSIS — K635 Polyp of colon: Secondary | ICD-10-CM

## 2019-03-25 DIAGNOSIS — Z87891 Personal history of nicotine dependence: Secondary | ICD-10-CM | POA: Insufficient documentation

## 2019-03-25 DIAGNOSIS — D122 Benign neoplasm of ascending colon: Secondary | ICD-10-CM | POA: Diagnosis not present

## 2019-03-25 DIAGNOSIS — H409 Unspecified glaucoma: Secondary | ICD-10-CM | POA: Diagnosis not present

## 2019-03-25 DIAGNOSIS — E559 Vitamin D deficiency, unspecified: Secondary | ICD-10-CM | POA: Diagnosis not present

## 2019-03-25 DIAGNOSIS — D630 Anemia in neoplastic disease: Secondary | ICD-10-CM | POA: Diagnosis not present

## 2019-03-25 DIAGNOSIS — K5000 Crohn's disease of small intestine without complications: Secondary | ICD-10-CM | POA: Diagnosis not present

## 2019-03-25 HISTORY — PX: POLYPECTOMY: SHX5525

## 2019-03-25 HISTORY — PX: COLONOSCOPY WITH PROPOFOL: SHX5780

## 2019-03-25 SURGERY — COLONOSCOPY WITH PROPOFOL
Anesthesia: Monitor Anesthesia Care

## 2019-03-25 MED ORDER — PROPOFOL 10 MG/ML IV BOLUS
INTRAVENOUS | Status: DC | PRN
  Administered 2019-03-25: 10 mg via INTRAVENOUS

## 2019-03-25 MED ORDER — METHYLENE BLUE 0.5 % INJ SOLN
INTRAVENOUS | Status: AC
  Filled 2019-03-25: qty 10

## 2019-03-25 MED ORDER — LACTATED RINGERS IV SOLN
INTRAVENOUS | Status: DC
  Administered 2019-03-25: 1000 mL via INTRAVENOUS

## 2019-03-25 MED ORDER — ONDANSETRON HCL 4 MG/2ML IJ SOLN
INTRAMUSCULAR | Status: DC | PRN
  Administered 2019-03-25: 4 mg via INTRAVENOUS

## 2019-03-25 MED ORDER — PROPOFOL 500 MG/50ML IV EMUL
INTRAVENOUS | Status: DC | PRN
  Administered 2019-03-25: 100 ug/kg/min via INTRAVENOUS

## 2019-03-25 MED ORDER — EPHEDRINE SULFATE-NACL 50-0.9 MG/10ML-% IV SOSY
PREFILLED_SYRINGE | INTRAVENOUS | Status: DC | PRN
  Administered 2019-03-25: 5 mg via INTRAVENOUS
  Administered 2019-03-25: 10 mg via INTRAVENOUS
  Administered 2019-03-25 (×2): 5 mg via INTRAVENOUS

## 2019-03-25 MED ORDER — SODIUM CHLORIDE 0.9 % IV SOLN: INTRAVENOUS | Status: DC

## 2019-03-25 MED ORDER — STERILE WATER FOR IRRIGATION IR SOLN
Status: DC | PRN
  Administered 2019-03-25: 60 mL

## 2019-03-25 SURGICAL SUPPLY — 21 items

## 2019-03-25 NOTE — Anesthesia Preprocedure Evaluation (Addendum)
Anesthesia Evaluation  Patient identified by MRN, date of birth, ID band Patient awake    Reviewed: Allergy & Precautions, NPO status , Patient's Chart, lab work & pertinent test results  Airway Mallampati: II  TM Distance: >3 FB     Dental   Pulmonary former smoker,    breath sounds clear to auscultation       Cardiovascular negative cardio ROS   Rhythm:Irregular     Neuro/Psych    GI/Hepatic Neg liver ROS, History noted CG   Endo/Other  negative endocrine ROS  Renal/GU negative Renal ROS     Musculoskeletal   Abdominal   Peds  Hematology  (+) Blood dyscrasia, anemia ,   Anesthesia Other Findings   Reproductive/Obstetrics                             Anesthesia Physical Anesthesia Plan  ASA: III  Anesthesia Plan: MAC   Post-op Pain Management:    Induction: Intravenous  PONV Risk Score and Plan: 1 and Ondansetron and Propofol infusion  Airway Management Planned: Nasal Cannula and Simple Face Mask  Additional Equipment:   Intra-op Plan:   Post-operative Plan:   Informed Consent:     Dental advisory given  Plan Discussed with: CRNA and Anesthesiologist  Anesthesia Plan Comments:         Anesthesia Quick Evaluation

## 2019-03-25 NOTE — Telephone Encounter (Signed)
I don't see any openings in your office schedule for a few weeks. Did you want to see this patient in 2 days. Please advise

## 2019-03-25 NOTE — Transfer of Care (Signed)
Immediate Anesthesia Transfer of Care Note  Patient: Michael [name]  Procedure(s) Performed: COLONOSCOPY WITH PROPOFOL (N/A ) ENDOSCOPIC MUCOSAL RESECTION (N/A ) POLYPECTOMY  Patient Location: PACU and Endoscopy Unit  Anesthesia Type:MAC  Level of Consciousness: awake, alert  and oriented  Airway & Oxygen Therapy: Patient Spontanous Breathing and Patient connected to face mask oxygen  Post-op Assessment: Report given to RN and Post -op Vital signs reviewed and stable  Post vital signs: Reviewed and stable  Last Vitals:  Vitals Value Taken Time  BP 166/75 03/25/19 1301  Temp    Pulse    Resp 17 03/25/19 1303  SpO2    Vitals shown include unvalidated device data.  Last Pain:  Vitals:   03/25/19 1116  TempSrc: Oral  PainSc: 0-No pain         Complications: No apparent anesthesia complications

## 2019-03-25 NOTE — Anesthesia Procedure Notes (Signed)
Procedure Name: MAC Date/Time: 03/25/2019 11:38 AM Performed by: Maxwell Caul, CRNA Pre-anesthesia Checklist: Patient identified, Emergency Drugs available, Suction available, Patient being monitored and Timeout performed Oxygen Delivery Method: Simple face mask

## 2019-03-25 NOTE — Telephone Encounter (Signed)
No I meant I will call them with path results in 2 days or so and then coordinate a follow up clinic visit in the upcoming weeks. Next routine office visit is fine, please let them know I will be calling them soon once I get the path back. Thanks

## 2019-03-25 NOTE — Anesthesia Postprocedure Evaluation (Signed)
Anesthesia Post Note  Patient: Michael [name]  Procedure(s) Performed: COLONOSCOPY WITH PROPOFOL (N/A ) POLYPECTOMY     Patient location during evaluation: Endoscopy Anesthesia Type: MAC Level of consciousness: awake Pain management: pain level controlled Vital Signs Assessment: post-procedure vital signs reviewed and stable Respiratory status: spontaneous breathing Cardiovascular status: stable Postop Assessment: no apparent nausea or vomiting Anesthetic complications: no    Last Vitals:  Vitals:   03/25/19 1330 03/25/19 1342  BP: (!) 145/103 (!) 182/73  Pulse: (!) 47   Resp: 16 15  Temp:    SpO2: 90% 95%    Last Pain:  Vitals:   03/25/19 1342  TempSrc:   PainSc: 0-No pain                 Arvie Villarruel

## 2019-03-25 NOTE — H&P (Signed)
HPI :  86 y/o male with longstanding Crohn's disease, recent surveillance colonoscopy done and numerous polyps removed. He had one large polyp in the transverse colon not removed at that time as well as abnormal mucosa in the right colon which was hard to visualize, biopsies of that returned as adenoma. He is here for EMR of large transverse colon polyp and removed of right sided polyp if amenable. He otherwise states he has been feeling okay, no active Crohn's symptoms right now.  Past Medical History:  Diagnosis Date  . Adrenal insufficiency ([place])   . Anemia   . Avascular necrosis ([place])   . Crohn's disease ([place])   . Glaucoma   . Inguinal hernia recurrent bilateral   . Low testosterone   . Osteopenia   . Retinal ischemia      Past Surgical History:  Procedure Laterality Date  . ABDOMINAL SURGERY    . COLONOSCOPY N/A 12/13/2015   Procedure: COLONOSCOPY;  Surgeon: Laurence Spates, MD;  Location: [facility];  Service: Endoscopy;  Laterality: N/A;  . INGUINAL HERNIA REPAIR Bilateral 05/21/2014   Procedure: OPEN BILATERAL INGUINAL HERNIA REPAIRS WITH MESH;  Surgeon: Donnie Mesa, MD;  Location: [facility];  Service: General;  Laterality: Bilateral;  . SMALL INTESTINE SURGERY  1968, 2001    related to Chrohn's disease  . TONSILLECTOMY     Family History  Problem Relation Age of Onset  . Deep vein thrombosis Mother   . Heart disease Father   . Stomach cancer Neg Hx   . Colon cancer Neg Hx   . Esophageal cancer Neg Hx   . Pancreatic cancer Neg Hx    Social History   Tobacco Use  . Smoking status: Former Smoker    Quit date: 04/01/1976    Years since quitting: 43.0  . Smokeless tobacco: Never Used  Substance Use Topics  . Alcohol use: Yes    Comment: 2-3  glass wine  weekly  1-2 cans beer/ week  . Drug use: No   Current Facility-Administered Medications  Medication Dose Route Frequency Provider Last Rate Last Admin  . 0.9 %  sodium chloride infusion    Intravenous Continuous Rahaf Carbonell, Carlota Raspberry, MD      . lactated ringers infusion   Intravenous Continuous Medha Pippen, Carlota Raspberry, MD       No Known Allergies   Review of Systems: All systems reviewed and negative except where noted in HPI.    DG Chest Port 1 View  Result Date: 03/10/2019 CLINICAL DATA:  Acute chest pain. EXAM: PORTABLE CHEST 1 VIEW COMPARISON:  11/15/2015 FINDINGS: The cardiomediastinal silhouette is unremarkable. There is no evidence of focal airspace disease, pulmonary edema, suspicious pulmonary nodule/mass, pleural effusion, or pneumothorax. No acute bony abnormalities are identified. IMPRESSION: No active disease. Electronically Signed   By: Margarette Canada M.D.   On: 03/10/2019 12:03    Physical Exam: There were no vitals taken for this visit. Constitutional: Pleasant,well-developed, male in no acute distress. Cardiovascular: Normal rate, regular rhythm.  Pulmonary/chest: Effort normal and breath sounds normal.  Abdominal: Soft, nondistended, nontender.  Extremities: no edema Lymphadenopathy: No cervical adenopathy noted. Neurological: Alert and oriented to person place and time. Skin: Skin is warm and dry. No rashes noted. Psychiatric: Normal mood and affect. Behavior is normal.   ASSESSMENT AND PLAN: 86 y/o male with history as above - here at the hospital for removal of large colon polyps, EMR. I have discussed risks / benefits of colonoscopy and anesthesia  with him and he wishes to proceed, further recommendations pending the results.  [place]ellar, MD [facility]

## 2019-03-25 NOTE — Op Note (Signed)
[facility] Patient Name: Michael [name] Procedure Date: 03/25/2019 MRN: [id number] Attending MD: Carlota Raspberry. Nigil Braman , MD Date of Birth: 1932/02/11 CSN: [id number] Age: 86 Admit Type: Outpatient Procedure:                Colonoscopy Indications:              Therapeutic procedure for colon polyps - history of                            ileal Crohn's disease, nearly 20 polyps removed in                            January, subtle flat lesion biopsied in right colon                            c/w adenoma, another large transverse polyp not                            removed at that time was noted. Here for follow up                            for removal of those polyps. Of note, fair prep                            noted on the last exam which made visualization of                            flat lesions difficult. Providers:                Carlota Raspberry. Havery Moros, MD, Cleda Daub, RN, Lina Sar, Technician Referring MD:              Medicines:                Monitored Anesthesia Care Complications:            No immediate complications. Estimated blood loss:                            Minimal. Estimated Blood Loss:     Estimated blood loss was minimal. Procedure:                Pre-Anesthesia Assessment:                           - Prior to the procedure, a History and Physical                            was performed, and patient medications and                            allergies were reviewed. The patient's tolerance of  previous anesthesia was also reviewed. The risks                            and benefits of the procedure and the sedation                            options and risks were discussed with the patient.                            All questions were answered, and informed consent                            was obtained. Prior Anticoagulants: The patient has                            taken no  previous anticoagulant or antiplatelet                            agents. ASA Grade Assessment: III - A patient with                            severe systemic disease. After reviewing the risks                            and benefits, the patient was deemed in                            satisfactory condition to undergo the procedure.                           After obtaining informed consent, the colonoscope                            was passed under direct vision. Throughout the                            procedure, the patient's blood pressure, pulse, and                            oxygen saturations were monitored continuously. The                            PCF-H190DL ([id number] Olympus pediatric colonscope                            was introduced through the anus and advanced to the                            the terminal ileum. The colonoscopy was technically                            difficult and complex due to unsatisfactory bowel  prep. The patient tolerated the procedure well. The                            quality of the bowel preparation was fair. The                            terminal ileum and the rectum were photographed. Scope In: 11:45:03 AM Scope Out: 12:54:21 PM Total Procedure Duration: 1 hour 9 minutes 18 seconds  Findings:      The perianal and digital rectal examinations were normal.      Patchy inflammation, graded as Rutgeerts Score i2 (more than five       aphthous lesions with normal intervening mucosa or skip areas of larger       lesions or lesions confined to the ileocolonic anastomosis) was found in       the terminal ileum.      There was evidence of a prior end-to-side ileo-colonic anastomosis in       the ascending colon. This was patent and was characterized by mild       inflammation.      There was residual stool in the ascending and proximal transverse colon.       Several minutes spent lavaging to obtain adequate  views. A solution of       methylene blue was used to evaluate the right colon to improve       visualization. Three flat polyps were found in the ascending colon. The       polyps were 21m or so in size and largest roughly 20 mm in size. The       largest was the lesion biopsied on the last exam, was flat and extended       from the ascending colon to ? the border of the anastomosis. These       polyps were removed with a cold snare, largest in piecemeal. Largest       polyp was quite difficult to visualize in entirety and possible it       involves the anastomosis.      Three flat polyps were found in the proximal transverse colon. The       polyps were 5 to 8 mm in size. These polyps were removed with a cold       snare. Resection and retrieval were complete.      A large polyp was found in the transverse colon. The polyp was flat a       few cms in size. The polyp was removed with a piecemeal technique using       a cold snare given the patient's age, risk for bleeding, and that if       came off fairly easily with cold snare. Resection and retrieval were       complete. Prior tattoo noted distal to this.      A large polyp was found in the transverse colon after lavage of the area       that was not visualized on the last exam due to prep. The polyp was       flat, located across the wall of the other larger polyp. Given the       duration of the case, ongoing issues with prep, and development of       persistent colonic spasm, this was not removed and the  procedure was       terminated.      A large amount of liquid stool was found in the entire colon, making       visualization difficult. Lavage of the area was performed, resulting in       clearance with fair visualization.      There was spasm in the entire colon which made it quite difficult to       remove some of these polyps. .      The exam was otherwise without abnormality athough not much time was       take to evaluate  the distal transverse and left colon. Impression:               - Preparation of the colon was fair with                            significant spasm which prolonged this exam                           - Active ileal Crohn's disease as described -                            [name] i2.                           - Patent end-to-side ileo-colonic anastomosis,                            characterized by inflammation.                           - Numerous flat polyps noted in the ascending colon                            and proximal transverse colon as noted. Several                            minutes spent lavaging the colon and methylene blue                            used to evaluate the right colon to better                            delineate some of these especially the largest                            lesion which may extend into the surgical                            anastomosis. Largest transverse polyp removed as                            outlined, with another large transverse polyp noted                            today which was not  removed given duration of                            procedure and colonic spasm.                           Will discuss options with the patient. He has                            declined surgical evaluation in the past for his                            Crohn's, not currently receiving medical therapy                            for Crohn's (which we will discuss again).                            Significant polyp burden perhaps with additional                            polyps not visualized on this exam today due to                            prep. Moderate Sedation:      No moderate sedation, case performed with MAC Recommendation:           - Patient has a contact number available for                            emergencies. The signs and symptoms of potential                            delayed complications were discussed with the                             patient. Return to normal activities tomorrow.                            Written discharge instructions were provided to the                            patient.                           - Resume previous diet.                           - Continue present medications.                           - Await pathology results. Procedure Code(s):        --- Professional ---                           [phone], Colonoscopy, flexible; with removal of  tumor(s), polyp(s), or other lesion(s) by snare                            technique Diagnosis Code(s):        --- Professional ---                           K58.9, Irritable bowel syndrome without diarrhea                           K50.90, Crohn's disease, unspecified, without                            complications                           Z98.0, Intestinal bypass and anastomosis status                           K63.5, Polyp of colon CPT copyright 2019 [employer]. All rights reserved. The codes documented in this report are preliminary and upon coder review may  be revised to meet current compliance requirements. Remo Lipps P. Dot Splinter, MD 03/25/2019 1:22:07 PM This report has been signed electronically. Number of Addenda: 0

## 2019-03-25 NOTE — Telephone Encounter (Signed)
Pt's wife Michael [name] called stating that Dr. Havery Moros told pt that he wanted to see pt in the office in the next two days. He had a procedure today at the hospital. His first available is not until late April.

## 2019-03-25 NOTE — Discharge Instructions (Signed)
YOU HAD AN ENDOSCOPIC PROCEDURE TODAY: Refer to the procedure report and other information in the discharge instructions given to you for any specific questions about what was found during the examination. If this information does not answer your questions, please call [place] office at [phone] to clarify.  ° °YOU SHOULD EXPECT: Some feelings of bloating in the abdomen. Passage of more gas than usual. Walking can help get rid of the air that was put into your GI tract during the procedure and reduce the bloating. If you had a lower endoscopy (such as a colonoscopy or flexible sigmoidoscopy) you may notice spotting of blood in your stool or on the toilet paper. Some abdominal soreness may be present for a day or two, also. ° °DIET: Your first meal following the procedure should be a light meal and then it is ok to progress to your normal diet. A half-sandwich or bowl of soup is an example of a good first meal. Heavy or fried foods are harder to digest and may make you feel nauseous or bloated. Drink plenty of fluids but you should avoid alcoholic beverages for 24 hours. If you had a esophageal dilation, please see attached instructions for diet.   ° °ACTIVITY: Your care partner should take you home directly after the procedure. You should plan to take it easy, moving slowly for the rest of the day. You can resume normal activity the day after the procedure however YOU SHOULD NOT DRIVE, use power tools, machinery or perform tasks that involve climbing or major physical exertion for 24 hours (because of the sedation medicines used during the test).  ° °SYMPTOMS TO REPORT IMMEDIATELY: °A gastroenterologist can be reached at any hour. Please call [phone]  for any of the following symptoms:  °Following lower endoscopy (colonoscopy, flexible sigmoidoscopy) °Excessive amounts of blood in the stool  °Significant tenderness, worsening of abdominal pains  °Swelling of the abdomen that is new, acute  °Fever of 100° or  higher  °Following upper endoscopy (EGD, EUS, ERCP, esophageal dilation) °Vomiting of blood or coffee ground material  °New, significant abdominal pain  °New, significant chest pain or pain under the shoulder blades  °Painful or persistently difficult swallowing  °New shortness of breath  °Black, tarry-looking or red, bloody stools ° °FOLLOW UP:  °If any biopsies were taken you will be contacted by phone or by letter within the next 1-3 weeks. Call [phone]  if you have not heard about the biopsies in 3 weeks.  °Please also call with any specific questions about appointments or follow up tests. ° °

## 2019-03-25 NOTE — Interval H&P Note (Signed)
History and Physical Interval Note:  03/25/2019 10:53 AM  Michael [name]  has presented today for surgery, with the diagnosis of large polyp on colonoscopy.  The various methods of treatment have been discussed with the patient and family. After consideration of risks, benefits and other options for treatment, the patient has consented to  Procedure(s): COLONOSCOPY WITH PROPOFOL (N/A) ENDOSCOPIC MUCOSAL RESECTION (N/A) as a surgical intervention.  The patient's history has been reviewed, patient examined, no change in status, stable for surgery.  I have reviewed the patient's chart and labs.  Questions were answered to the patient's satisfaction.     [place]

## 2019-03-26 ENCOUNTER — Encounter: Payer: Self-pay | Admitting: *Deleted

## 2019-03-26 LAB — SURGICAL PATHOLOGY

## 2019-03-26 NOTE — Telephone Encounter (Signed)
Called patient and let him know Dr. Havery Moros would be calling with path results and that he wants to see him for an office visit in April. Scheduled office visit on 05/02/19

## 2019-04-02 DIAGNOSIS — Z03818 Encounter for observation for suspected exposure to other biological agents ruled out: Secondary | ICD-10-CM | POA: Diagnosis not present

## 2019-04-29 DIAGNOSIS — L820 Inflamed seborrheic keratosis: Secondary | ICD-10-CM | POA: Diagnosis not present

## 2019-04-30 DIAGNOSIS — N3281 Overactive bladder: Secondary | ICD-10-CM | POA: Diagnosis not present

## 2019-04-30 DIAGNOSIS — R3915 Urgency of urination: Secondary | ICD-10-CM | POA: Diagnosis not present

## 2019-05-02 ENCOUNTER — Ambulatory Visit: Payer: Medicare Other | Admitting: Gastroenterology

## 2019-05-02 ENCOUNTER — Encounter: Payer: Self-pay | Admitting: Gastroenterology

## 2019-05-02 VITALS — BP 142/86 | HR 74 | Temp 97.2°F | Ht 71.0 in | Wt 182.4 lb

## 2019-05-02 DIAGNOSIS — K50019 Crohn's disease of small intestine with unspecified complications: Secondary | ICD-10-CM | POA: Diagnosis not present

## 2019-05-02 DIAGNOSIS — Z86010 Personal history of colonic polyps: Secondary | ICD-10-CM | POA: Diagnosis not present

## 2019-05-02 MED ORDER — BUDESONIDE 3 MG PO CPEP: 9.0000 mg | ORAL_CAPSULE | Freq: Every day | ORAL | 0 refills | Status: DC

## 2019-05-02 NOTE — Progress Notes (Signed)
HPI :  86 year old male here for follow-up for Crohn's disease.   Crohn's history: Previously followed by Dr. Earle Gell. 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 He has had 2 operations for his Crohns in 1968 and then again 2001 or so for small bowel resections. He has been treated with prednisone as needed in the past. He has never been on any other therapy for Crohn's disease other than prednisone and surgery. He has had hospitalizations in the past for bowel obstructions. Last hospitalizationJune 2019, prior to that a few years previously. Unfortunately he has suffered complications from chronic steroid use. He is currently being followed by Endocrinology foradrenal insufficiency. He has also been noted to have hepatic steatosis and avascular necrosis of the hips on CT scan. His course has also been complicated by history of diverticulosis related bleeding.Most recently has been using budesonide for flares as needed, and noted to have numerous colon polyps.   SINCE LAST VISIT:  86 year old male here for follow-up visit.  He has been feeling well since her last visit.  He finished his last course of budesonide for a mild flare and states he has been feeling really well.  No abdominal pains at all.  Denies any tightness in his abdomen or postprandial symptoms.  No diarrhea.  He is eating okay.  Weight is stable.  No blood in his stools.  He has been seen by urology for frequent urination and may undergo some nerve stimulation therapy.  Since of last seen him he has had 2 colonoscopies as outlined below.  In short, he has active ileitis.  He does not have active colitis, however he has had 30 polyps removed and his last 2 exams over the past few months.  He has had some large flat lesions in the right colon, the preps have been fair on both exams making this technically challenging.  Methylene blue was used at the hospital to evaluate the right colon better and he had a large polyp  that could be invading the anastomosis.  I have discussed his case at the IBD multidisciplinary conference to get surgical opinions and discuss his case with my colleagues.  I have reviewed all of this with him today.  He has not been in any dedicated Crohn's therapy that has not been steroid based, ever.  We have discussed biologic therapy in the past which he has declined.  We looked into getting Entyvio however its over $2000 per infusion which is not affordable.  Colonoscopy 12/2015 - [name] - normal colon, patent ileocolonic anastomosis, active Crohn's ileitis  Colonoscopy 01/24/2019  - Preparation of the colon was fair. - Crohn's disease with mild ileitis. - Patent end-to-side ileo-colonic anastomosis, characterized by mild stenosis. Biopsied. - Abnormal mucosa in the ascending colon. Biopsied. - Two 3 to 6 mm polyps in the ascending colon, removed with a cold snare. Resected and retrieved. - Eight 4 to 12 mm polyps in the transverse colon, removed with a cold snare. Resected and retrieved. - One large polyp in the transverse colon. Tattooed. Not removed as outlined above - One 10 mm polyp at the splenic flexure, removed with a cold snare. Resected and retrieved. - One 10 mm polyp in the descending colon, removed with a cold snare. Resected and retrieved. - Five 5 to 10 mm polyps in the sigmoid colon, removed with a cold snare. Resected and retrieved. - Two 4 mm polyps polyps at the recto-sigmoid colon, removed with a cold snare. Resected and retrieved. -  Diverticulosis in the entire examined colon. - Stool in the entire examined colon. - Colonic spasm. - The examination was otherwise normal.  Most polyps adenomatous Biopsies of right sided abnormality adenomatous  Colonoscopy 03/25/19  - Preparation of the colon was fair with significant spasm which prolonged this exam - Active ileal Crohn's disease as described - [name] i2. - Patent end-to-side ileo-colonic anastomosis,  characterized by inflammation. - Numerous flat polyps noted in the ascending colon and proximal transverse colon as noted. Several minutes spent lavaging the colon and methylene blue used to evaluate the right colon to better delineate some of these especially the largest lesion which may extend into the surgical anastomosis. Largest transverse polyp removed as outlined, with another large transverse polyp noted today which was not removed given duration of procedure and colonic spasm.  FINAL MICROSCOPIC DIAGNOSIS:   A. COLON, RIGHT, POLYPECTOMY:  - Tubular adenoma (x2 fragments).  - No high grade dysplasia or malignancy.   B. COLON, RIGHT NEAR ANASTAMOSIS, POLYPECTOMY:  - Tubular adenoma (multiple fragments).  - No high grade dysplasia or malignancy.   C. COLON, PROXIMAL TRANSVERSE, POLYPECTOMY:  - Tubular adenoma (multiple fragments).  - No high grade dysplasia or malignancy.   D. COLON, TRANSVERSE, LARGE, POLYPECTOMY:  - Tubular adenoma (multiple fragments).  - No high grade dysplasia or malignancy.    Past Medical History:  Diagnosis Date  . Adrenal insufficiency ([place])   . Anemia   . Avascular necrosis ([place])   . Crohn's disease ([place])   . Glaucoma   . Inguinal hernia recurrent bilateral   . Low testosterone   . Osteopenia   . Retinal ischemia      Past Surgical History:  Procedure Laterality Date  . ABDOMINAL SURGERY    . COLONOSCOPY N/A 12/13/2015   Procedure: COLONOSCOPY;  Surgeon: Laurence Spates, MD;  Location: [facility];  Service: Endoscopy;  Laterality: N/A;  . COLONOSCOPY WITH PROPOFOL N/A 03/25/2019   Procedure: COLONOSCOPY WITH PROPOFOL;  Surgeon: Yetta Flock, MD;  Location: [facility];  Service: Gastroenterology;  Laterality: N/A;  . INGUINAL HERNIA REPAIR Bilateral 05/21/2014   Procedure: OPEN BILATERAL INGUINAL HERNIA REPAIRS WITH MESH;  Surgeon: Donnie Mesa, MD;  Location: [place];  Service: General;  Laterality: Bilateral;   . POLYPECTOMY  03/25/2019   Procedure: POLYPECTOMY;  Surgeon: Yetta Flock, MD;  Location: [facility];  Service: Gastroenterology;;  . SMALL INTESTINE SURGERY  1968, 2001    related to Chrohn's disease  . TONSILLECTOMY     Family History  Problem Relation Age of Onset  . Deep vein thrombosis Mother   . Heart disease Father   . Stomach cancer Neg Hx   . Colon cancer Neg Hx   . Esophageal cancer Neg Hx   . Pancreatic cancer Neg Hx    Social History   Tobacco Use  . Smoking status: Former Smoker    Quit date: 04/01/1976    Years since quitting: 43.1  . Smokeless tobacco: Never Used  Substance Use Topics  . Alcohol use: Yes    Comment: 2-3  glass wine  weekly  1-2 cans beer/ week  . Drug use: No   Current Outpatient Medications  Medication Sig Dispense Refill  . Ascorbic Acid (VITAMIN C) 1000 MG tablet Take 1,000 mg by mouth daily.    Marland Kitchen augmented betamethasone dipropionate (DIPROLENE-AF) 0.05 % cream Apply 1 application topically daily as needed (itching).     . brimonidine (ALPHAGAN) 0.2 % ophthalmic solution Place  1 drop into the left eye in the morning and at bedtime.     . budesonide (ENTOCORT EC) 3 MG 24 hr capsule Take 3 capsules (9 mg total) by mouth daily. Take 75m daily for 4 weeks, then 611mdaily for 2 weeks, then 3 mg daily for 2 weeks, then done (Patient taking differently: Take 3-6 mg by mouth See admin instructions. Take 62m23maily for 4 weeks, then 6mg54mily for 2 weeks, then 3 mg daily for 2 weeks, then done) 126 capsule 0  . Calcium Carbonate-Vit D-Min (CALCIUM 1200 PO) Take 2 tablets by mouth daily.     . Cholecalciferol (VITAMIN D) 2000 units CAPS Take 1 capsule (2,000 Units total) by mouth daily. 30 capsule   . cyanocobalamin (,VITAMIN B-12,) 1000 MCG/ML injection INJECT 1ML INTO THE MUSCLE EVERY 30 DAYS 10 mL 0  . dorzolamide-timolol (COSOPT) 22.3-6.8 MG/ML ophthalmic solution Place 1 drop into the left eye 2 (two) times daily.     . ferrous sulfate 325  (65 FE) MG tablet Take 325 mg by mouth every Monday, Wednesday, and Friday.    . latanoprost (XALATAN) 0.005 % ophthalmic solution Place 1 drop into the left eye at bedtime.     . loMarland Kitcheneramide (IMODIUM A-D) 2 MG tablet Take 2 mg by mouth 3 (three) times daily as needed for diarrhea or loose stools.    . NeMckinley Jewelesylate (RHOPRESSA) 0.02 % SOLN Place 1 drop into the left eye at bedtime.     . omMarland Kitchenprazole (PRILOSEC) 20 MG capsule Take 20 mg by mouth daily.     . prednisoLONE acetate (PRED FORTE) 1 % ophthalmic suspension Place 1 drop into the right eye in the morning, at noon, in the evening, and at bedtime.      No current facility-administered medications for this visit.   No Known Allergies   Review of Systems: All systems reviewed and negative except where noted in HPI.   Lab Results  Component Value Date   WBC 11.7 (H) 03/10/2019   HGB 13.4 03/10/2019   HCT 39.2 03/10/2019   MCV 94.0 03/10/2019   PLT 239 03/10/2019    Lab Results  Component Value Date   CREATININE 0.84 03/10/2019   BUN 14 03/10/2019   NA 138 03/10/2019   K 3.2 (L) 03/10/2019   CL 101 03/10/2019   CO2 28 03/10/2019    Lab Results  Component Value Date   ALT 17 03/10/2019   AST 20 03/10/2019   ALKPHOS 47 03/10/2019   BILITOT 1.0 03/10/2019     Physical Exam: BP (!) 142/86 (BP Location: Left Arm, Patient Position: Sitting, Cuff Size: Normal)   Pulse 74   Temp (!) 97.2 F (36.2 C)   Ht 5' 11"  (1.803 m)   Wt 182 lb 6 oz (82.7 kg)   SpO2 99%   BMI 25.44 kg/m  Constitutional: Pleasant,well-developed, male in no acute distress. Abdominal: Soft, nondistended, nontender.  There are no masses palpable. No hepatomegaly. Extremities: no edema Lymphadenopathy: No cervical adenopathy noted. Neurological: Alert and oriented to person place and time. Skin: Skin is warm and dry. No rashes noted. Psychiatric: Normal mood and affect. Behavior is normal.   ASSESSMENT AND PLAN: 87 y48 male here for  reassessment of the following:  Ileal Crohn's disease / history of colon polyps - very difficult situation.  He has some stricturing in his ileum as well as active inflammation.  He has had a few flares recently leading to use of budesonide which  has thankfully kept him out of the hospital.  In conjunction with his ileitis, he has had 30 adenomatous polyps removed from his colon over the last 2 exams, some large.  His colonoscopy exam has been quite challenging as he has had fair preps on his last 2 exams, needing to use methylene blue to evaluate the right colon, with a large polyp that may be invading the surgical anastomosis although I think most of that was resected on the last exam.  I presented his case at the multidisciplinary IBD meeting.  Given his age, the surgeons want to avoid an operation if at all possible.  If he was younger than right hemicolectomy with resection of the anastomosis will be the treatment of choice, however this surgery has considerable risks for him especially with untreated Crohn's disease.  Patient agrees that he does not want to have surgery, but understands he is at risk for colon cancer, and especially if the polyp is invading the anastomosis that would be very difficult to remove without a surgery.  We have recommended medical therapy, specifically biologic therapy, to treat his Crohn's disease, he is concerned about the risks of anti-TNF and unfortunately cannot afford Entyvio.  He does not wish to escalate medical therapy at this time.  Our option left given he wants to avoid surgery and avoid escalation of medical therapy, is budesonide as needed for flares, and surveillance colonoscopy periodically to reduce polyp burden in the hopes of preventing him from developing colon cancer.  He understands that with removing large polyps risks of this is not insignificant as well.  The other option would to stop pursuing further surveillance and just monitor, while understanding  that this risk is highest for development of colon cancer.  We discussed risks and benefits of all of these options -surgery, medical therapy, colonoscopy surveillance, versus nothing.  After lengthy discussion about this with the patient and his wife, they wish to stay on a low residue diet and use budesonide as needed.  They do not wish to pursue biologic therapy.  They do not wish to pursue surgery.  They do wish to have another surveillance colonoscopy sometime over the summer.  We will schedule for June July timeframe, plan to remove another large polyp noted on the last exam, will also ask for a double preparation if he is willing to get better evaluation due to issues with prep in the past.  He agreed with the plan, all questions answered.  Whitfill Cellar, MD [facility]

## 2019-05-02 NOTE — Patient Instructions (Signed)
We have sent the following medications to your pharmacy for you to pick up at your convenience: budesonide.   Thank you for entrusting me with your care and for choosing [facility], Dr. Tracy Cellar

## 2019-05-05 NOTE — Progress Notes (Signed)
Subjective:    Patient ID: Michael [name], male    DOB: 01/08/1933, 86 y.o.   MRN: [id number]  HPI The patient is here for follow up of their chronic medical problems, including osteopenia, B12 def, vitamin d def.  He is here today alone.  His wife did send a list of concerns.  Frequent urination: He is following with urology.  He has tried 2 medications in the past and they did not help.  He is considering neuro stimulation and wonders if he should try it.  He can urinate 3-4 times at night.  He states during the day it is not as bad.   Osteopenia: He has been on and off steroids over the years for Crohn's disease.  He does have osteopenia.  His bone density is due in a couple of months.  He is taking calcium and vitamin D.  He is exercising, but not as much since Covid started.  He plays tennis once a week.  His family does not want him going back to the gym.   Discussed mood: His wife is concerned about his mood and anger.  She wondered about a medication.  He does not feel that he needs the medication.  He states there was some mild depression with the Covid pandemic, but with his helped him the most was getting out and doing more.  Discussed memory: His wife wondered if he should take something to help preserve his memory.  He does not want any prescription medication, but wonders if a supplement would help.  He has not had official testing.  He does not feel his memory is horrible, but would be interested in helping to preserve it.  B12 deficiency: He is not currently taking any B12.  He did receive an injection last fall when he was here.  Medications and allergies reviewed with patient and updated if appropriate.  Patient Active Problem List   Diagnosis Date Noted  . Polyp of large intestine   . Asymptomatic varicose veins of left lower extremity 12/16/2017  . Hyperglycemia 11/02/2017  . 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 Clavicle fracture 11/02/2017  . Vitamin D deficiency  11/02/2017  . Leg DVT (deep venous thromboembolism), acute, left ([place]) 08/08/2017  . SBO (small bowel obstruction) ([place]) 07/05/2017  . Bilateral hearing loss 05/31/2017  . B12 deficiency 02/06/2017  . Osteopenia 02/06/2017  . Glaucoma 12/12/2015  . Crohn's disease ([place]) 12/12/2015  . Anemia 12/12/2015  . 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 Ascorbic Acid (VITAMIN C) 1000 MG tablet Take 1,000 mg by mouth daily.    Marland Kitchen augmented betamethasone dipropionate (DIPROLENE-AF) 0.05 % cream Apply 1 application topically daily as needed (itching).     . brimonidine (ALPHAGAN) 0.2 % ophthalmic solution Place 1 drop into the left eye in the morning and at bedtime.     . budesonide (ENTOCORT EC) 3 MG 24 hr capsule Take 3 capsules (9 mg total) by mouth daily. Take 69m daily for 4 weeks, then 632mdaily for 2 weeks, then 3 mg daily for 2 weeks, then done 126 capsule 0  . Calcium Carbonate-Vit D-Min (CALCIUM 1200 PO) Take 2 tablets by mouth daily.     . Cholecalciferol (VITAMIN D) 2000 units CAPS Take 1 capsule (2,000 Units total) by mouth daily. 30 capsule   . cyanocobalamin (,VITAMIN B-12,) 1000 MCG/ML injection INJECT 1ML INTO THE MUSCLE EVERY 30 DAYS  10 mL 0  . dorzolamide-timolol (COSOPT) 22.3-6.8 MG/ML ophthalmic solution Place 1 drop into the left eye 2 (two) times daily.     . ferrous sulfate 325 (65 FE) MG tablet Take 325 mg by mouth every Monday, Wednesday, and Friday.    . latanoprost (XALATAN) 0.005 % ophthalmic solution Place 1 drop into the left eye at bedtime.     Marland Kitchen loperamide (IMODIUM A-D) 2 MG tablet Take 2 mg by mouth 3 (three) times daily as needed for diarrhea or loose stools.    Mckinley Jewel Dimesylate (RHOPRESSA) 0.02 % SOLN Place 1 drop into the left eye at bedtime.     Marland Kitchen omeprazole (PRILOSEC) 20 MG capsule Take 20 mg by mouth daily.     . prednisoLONE acetate (PRED FORTE) 1 % ophthalmic suspension Place  1 drop into the right eye in the morning, at noon, in the evening, and at bedtime.      No current facility-administered medications on file prior to visit.    Past Medical History:  Diagnosis Date  . Adrenal insufficiency ([place])   . Anemia   . Avascular necrosis ([place])   . Crohn's disease ([place])   . Glaucoma   . Inguinal hernia recurrent bilateral   . Low testosterone   . Osteopenia   . Retinal ischemia     Past Surgical History:  Procedure Laterality Date  . ABDOMINAL SURGERY    . COLONOSCOPY N/A 12/13/2015   Procedure: COLONOSCOPY;  Surgeon: Laurence Spates, MD;  Location[facility];  Service: Endoscopy;  Laterality: N/A;  . COLONOSCOPY WITH PROPOFOL N/A 03/25/2019   Procedure: COLONOSCOPY WITH PROPOFOL;  Surgeon: Yetta Flock, MD;  Location: [facility];  Service: Gastroenterology;  Laterality: N/A;  . INGUINAL HERNIA REPAIR Bilateral 05/21/2014   Procedure: OPEN BILATERAL INGUINAL HERNIA REPAIRS WITH MESH;  Surgeon: Donnie Mesa, MD;  Location: [place];  Service: General;  Laterality: Bilateral;  . POLYPECTOMY  03/25/2019   Procedure: POLYPECTOMY;  Surgeon: Yetta Flock, MD;  [facility];  Service: Gastroenterology;;  . SMALL INTESTINE SURGERY  1968, 2001    related to Chrohn's disease  . TONSILLECTOMY      Social History   Socioeconomic History  . Marital status: Married    Spouse name: Not on file  . Number of children: 3  . Years of education: Not on file  . Highest education level: Not on file  Occupational History  . Occupation: Retired  Tobacco Use  . Smoking status: Former Smoker    Quit date: 04/01/1976    Years since quitting: 43.1  . Smokeless tobacco: Never Used  Substance and Sexual Activity  . Alcohol use: Yes    Comment: 2-3  glass wine  weekly  1-2 cans beer/ week  . Drug use: No  . Sexual activity: Never  Other Topics Concern  . Not on file  Social History Narrative  . Not on file   Social  Determinants of Health   Financial Resource Strain:   . Difficulty of Paying Living Expenses:   Food Insecurity:   . Worried About Charity fundraiser in the Last Year:   . Arboriculturist in the Last Year:   Transportation Needs:   . Film/video editor (Medical):   Marland Kitchen Lack of Transportation (Non-Medical):   Physical Activity:   . Days of Exercise per Week:   . Minutes of Exercise per Session:   Stress:   . Feeling of Stress :  Social Connections:   . Frequency of Communication with Friends and Family:   . Frequency of Social Gatherings with Friends and Family:   . Attends Religious Services:   . Active Member of Clubs or Organizations:   . Attends Archivist Meetings:   Marland Kitchen Marital Status:     Family History  Problem Relation Age of Onset  . Deep vein thrombosis Mother   . Heart disease Father   . Stomach cancer Neg Hx   . Colon cancer Neg Hx   . Esophageal cancer Neg Hx   . Pancreatic cancer Neg Hx     Review of Systems  Constitutional: Negative for fever.  Respiratory: Negative for cough, shortness of breath and wheezing.   Cardiovascular: Negative for chest pain, palpitations and leg swelling.  Neurological: Negative for light-headedness and headaches.       Objective:   Vitals:   05/06/19 0836  BP: 140/80  Pulse: 70  Temp: 98.3 F (36.8 C)  SpO2: 99%   BP Readings from Last 3 Encounters:  05/06/19 140/80  05/02/19 (!) 142/86  03/25/19 (!) 182/73   Wt Readings from Last 3 Encounters:  05/06/19 179 lb 12.8 oz (81.6 kg)  05/02/19 182 lb 6 oz (82.7 kg)  03/18/19 186 lb (84.4 kg)   Body mass index is 25.08 kg/m.   Physical Exam    Constitutional: Appears well-developed and well-nourished. No distress.  HENT:  Head: Normocephalic and atraumatic.  Neck: Neck supple. No tracheal deviation present. No thyromegaly present.  No cervical lymphadenopathy Cardiovascular: Normal rate, regular rhythm and normal heart sounds.   No murmur heard.  No carotid bruit .  No edema Pulmonary/Chest: Effort normal and breath sounds normal. No respiratory distress. No has no wheezes. No rales.  Skin: Skin is warm and dry. Not diaphoretic.  Psychiatric: Normal mood and affect. Behavior is normal.      Assessment & Plan:    20 minutes were spent face-to-face with the patient, reviewing several new concerns along with his chronic medical problems.  This involved reviewing his chart and notes from specialists.   See Problem List for Assessment and Plan of chronic medical problems.    This visit occurred during the SARS-CoV-2 public health emergency.  Safety protocols were in place, including screening questions prior to the visit, additional usage of staff PPE, and extensive cleaning of exam room while observing appropriate contact time as indicated for disinfecting solutions.

## 2019-05-05 NOTE — Patient Instructions (Addendum)
  Blood work was ordered.     Medications reviewed and updated.  Changes include :   none     Please followup in 1 year

## 2019-05-06 ENCOUNTER — Other Ambulatory Visit: Payer: Self-pay

## 2019-05-06 ENCOUNTER — Encounter: Payer: Self-pay | Admitting: Internal Medicine

## 2019-05-06 ENCOUNTER — Ambulatory Visit (INDEPENDENT_AMBULATORY_CARE_PROVIDER_SITE_OTHER): Payer: Medicare Other | Admitting: Internal Medicine

## 2019-05-06 VITALS — BP 140/80 | HR 70 | Temp 98.3°F | Ht 71.0 in | Wt 179.8 lb

## 2019-05-06 DIAGNOSIS — R413 Other amnesia: Secondary | ICD-10-CM

## 2019-05-06 DIAGNOSIS — E538 Deficiency of other specified B group vitamins: Secondary | ICD-10-CM

## 2019-05-06 DIAGNOSIS — Z13820 Encounter for screening for osteoporosis: Secondary | ICD-10-CM | POA: Diagnosis not present

## 2019-05-06 DIAGNOSIS — E559 Vitamin D deficiency, unspecified: Secondary | ICD-10-CM | POA: Diagnosis not present

## 2019-05-06 DIAGNOSIS — M85851 Other specified disorders of bone density and structure, right thigh: Secondary | ICD-10-CM | POA: Diagnosis not present

## 2019-05-06 DIAGNOSIS — Z86718 Personal history of other venous thrombosis and embolism: Secondary | ICD-10-CM

## 2019-05-06 DIAGNOSIS — G3184 Mild cognitive impairment, so stated: Secondary | ICD-10-CM | POA: Insufficient documentation

## 2019-05-06 DIAGNOSIS — N3281 Overactive bladder: Secondary | ICD-10-CM | POA: Diagnosis not present

## 2019-05-06 DIAGNOSIS — M85852 Other specified disorders of bone density and structure, left thigh: Secondary | ICD-10-CM

## 2019-05-06 NOTE — Assessment & Plan Note (Signed)
Chronic B12 level was low in the fall Did start B12 injections, but he has not come on a monthly basis Not currently taking B12 We will check B12 level again and then discussed with him options for how he would like to treat

## 2019-05-06 NOTE — Assessment & Plan Note (Addendum)
Taking vitamin d and calcium daily Playing tennis once a week dexa due 06/2019 - ordered Encouraged him to try to increase activity

## 2019-05-06 NOTE — Assessment & Plan Note (Signed)
New His wife wondered about taking something to help preserve his memory We did not officially check his memory here today with an MMSE, but he states he would be interested in helping to preserve his memory Discussed possibly seeing neuropsychology for full evaluation, but he does not feel that is necessary Discussed supplements that I would not tested to know if they truly help that he could try Discussed that we could consider a prescription medication, but he would need more formal testing He will look into the supplements

## 2019-05-06 NOTE — Assessment & Plan Note (Signed)
Chronic Has osteopenia Taking calcium and vitamin D daily We will check vitamin D level

## 2019-05-06 NOTE — Assessment & Plan Note (Signed)
Chronic Follows with urology Has tried 2 different medications have not helped Considering neuro stimulation.  Discussed that I think this is something he should consider if covered by insurance

## 2019-05-06 NOTE — Assessment & Plan Note (Signed)
Completed 6 months of Eliquis Did see vascular at the time Denies any edema in his left leg or pain

## 2019-05-07 ENCOUNTER — Telehealth: Payer: Self-pay | Admitting: Internal Medicine

## 2019-05-07 ENCOUNTER — Telehealth: Payer: Self-pay | Admitting: Gastroenterology

## 2019-05-07 DIAGNOSIS — R413 Other amnesia: Secondary | ICD-10-CM

## 2019-05-07 DIAGNOSIS — R350 Frequency of micturition: Secondary | ICD-10-CM

## 2019-05-07 NOTE — Telephone Encounter (Signed)
New message:   Pt's wife is calling and states the pt needs a urine culture due to him thinking he has a UTI. Pt was just here on yesterday for an appt. Please advise.

## 2019-05-07 NOTE — Telephone Encounter (Signed)
Called and Spoke to pt's wife.  She is seeing some cognitive decline in [place].  They discussed this with Dr. Quay Burow at his appt on Monday. She said they could consider some over the counter memory supplements or go to a neurologist for an evaluation. The patient's wife is wondering if you know of any side effects from memory supplement drugs that would negatively effect "the gut". She doesn't want to consider any that will cause more problems from a GI perspective than they fix. Dr. Quay Burow mentioned Elzie Rings,  Aricept or [place].   Please advise

## 2019-05-07 NOTE — Telephone Encounter (Deleted)
Hey Dr. Loletha Carrow- this patient is requesting to transfer her care from [facility] to [place]. She was in the hospital in January and they told her she had acute pancreatitis. I have her records to send to you for review. You are DOD from when we received records (4/27 AM). Please advise on scheduling. Thank you!

## 2019-05-07 NOTE — Telephone Encounter (Signed)
Generally I don't think any of those medications would flare his Crohn's. Aricept is safe, I can't find Neuriva / Prevagen if you have the other names for those if that is what is being considered. Generally however as long as he is not taking NSAIDs that is the most concerning things for his Crohn's

## 2019-05-08 ENCOUNTER — Other Ambulatory Visit (INDEPENDENT_AMBULATORY_CARE_PROVIDER_SITE_OTHER): Payer: Medicare Other

## 2019-05-08 DIAGNOSIS — R350 Frequency of micturition: Secondary | ICD-10-CM | POA: Diagnosis not present

## 2019-05-08 NOTE — Telephone Encounter (Signed)
UA, UCx ordered

## 2019-05-08 NOTE — Telephone Encounter (Signed)
Called and relayed information to pt's wife.  She expressed understanding.

## 2019-05-08 NOTE — Telephone Encounter (Signed)
Pts wife wanted to know depending on the urine results, would you consider a memory medication for him? She wonders if he has a UTI and that is messing with his memory as well but wants to know what you think about medication.

## 2019-05-08 NOTE — Telephone Encounter (Signed)
LVM with response below.

## 2019-05-08 NOTE — Telephone Encounter (Signed)
I did discuss his memory with him yesterday at the visit.  We discussed 2 options-trying something over-the-counter, which may or may not help-these include something like prevagen.  There is no research to say if this truly helps or not, but many people do take it feel that it does.  The other option was something prescription, but he would need to undergo further evaluation for his memory.  He did not feel that his memory was enough of a concern to consider the second option.  We will call with the results of the urine.

## 2019-05-09 ENCOUNTER — Other Ambulatory Visit: Payer: Self-pay

## 2019-05-09 ENCOUNTER — Ambulatory Visit (INDEPENDENT_AMBULATORY_CARE_PROVIDER_SITE_OTHER)
Admission: RE | Admit: 2019-05-09 | Discharge: 2019-05-09 | Disposition: A | Discharge status: Home / Self Care | Payer: Medicare Other | Source: Ambulatory Visit | Attending: Internal Medicine | Admitting: Internal Medicine

## 2019-05-09 DIAGNOSIS — M85852 Other specified disorders of bone density and structure, left thigh: Secondary | ICD-10-CM

## 2019-05-09 DIAGNOSIS — M85851 Other specified disorders of bone density and structure, right thigh: Secondary | ICD-10-CM | POA: Diagnosis not present

## 2019-05-09 DIAGNOSIS — Z13820 Encounter for screening for osteoporosis: Secondary | ICD-10-CM

## 2019-05-09 LAB — URINALYSIS, ROUTINE W REFLEX MICROSCOPIC
Bilirubin Urine: NEGATIVE
Hgb urine dipstick: NEGATIVE
Ketones, ur: NEGATIVE
Leukocytes,Ua: NEGATIVE
Nitrite: NEGATIVE
RBC / HPF: NONE SEEN (ref 0–?)
Specific Gravity, Urine: <=1.005 — AB (ref 1.000–1.030)
Total Protein, Urine: NEGATIVE
Urine Glucose: NEGATIVE
Urobilinogen, UA: 0.2 (ref 0.0–1.0)
WBC, UA: NONE SEEN (ref 0–?)
pH: 6 (ref 5.0–8.0)

## 2019-05-09 LAB — URINE CULTURE
MICRO NUMBER:: 10415884
Result:: NO GROWTH
SPECIMEN QUALITY:: ADEQUATE

## 2019-05-13 ENCOUNTER — Other Ambulatory Visit: Payer: Self-pay

## 2019-05-13 ENCOUNTER — Telehealth: Payer: Self-pay

## 2019-05-13 DIAGNOSIS — Z86010 Personal history of colonic polyps: Secondary | ICD-10-CM

## 2019-05-13 DIAGNOSIS — K50019 Crohn's disease of small intestine with unspecified complications: Secondary | ICD-10-CM

## 2019-05-13 NOTE — Progress Notes (Signed)
Letter mailed to pt with dates for colon, Covid test and Previst.

## 2019-05-13 NOTE — Progress Notes (Signed)
Orders for colonoscopy on Tuesday, July 13th. To be Covid tested on Friday, July 9th.

## 2019-05-13 NOTE — Telephone Encounter (Signed)
Called and spoke to pt. Let him know he is scheduled for his colonoscopy with [facility] at [facility] on Tuesday, 7-13.  He is scheduled for his nurse previsit on Tuesday, 6-29 at 1:00pm to be instructed.  He asked that I send him a letter with all the dates and times. Letter mailed

## 2019-05-14 ENCOUNTER — Telehealth: Payer: Self-pay

## 2019-05-14 NOTE — Telephone Encounter (Signed)
New message    [name] calling no details were disclosed asking for a callback.

## 2019-05-14 NOTE — Telephone Encounter (Signed)
Aware of results. Wife wants a referral to neurology Dr. Jaynee Eagles.

## 2019-05-14 NOTE — Telephone Encounter (Signed)
Spoke with wife in regards. Documentation in another phone note.

## 2019-05-15 ENCOUNTER — Telehealth: Payer: Self-pay

## 2019-05-15 NOTE — Addendum Note (Signed)
Addended by: Binnie Rail on: 05/15/2019 07:35 AM   Modules accepted: Orders

## 2019-05-15 NOTE — Telephone Encounter (Signed)
ordered

## 2019-05-15 NOTE — Telephone Encounter (Signed)
New message    [name] calling asks the nature of the call, no detail was given will explain when the CMA calls back.

## 2019-05-16 ENCOUNTER — Other Ambulatory Visit: Payer: Self-pay

## 2019-05-16 DIAGNOSIS — R413 Other amnesia: Secondary | ICD-10-CM

## 2019-05-16 NOTE — Telephone Encounter (Signed)
Wife wanted to make sure that the referral for neurology was send specifically to Dr. Jaynee Eagles. I told her per the note left in the referral that it looked like someone was ok with seeing another provider. She thinks the husband must've answered the phone that time and said it was ok but the wife wants to see Jaynee Eagles. Please have them call the wife's number listed. Thank you!

## 2019-05-16 NOTE — Telephone Encounter (Signed)
Sent in basket msg to referral coordinator at [facility]

## 2019-05-20 ENCOUNTER — Telehealth: Payer: Self-pay | Admitting: Gastroenterology

## 2019-05-20 NOTE — Telephone Encounter (Signed)
Cancelled  procedure at [facility], Previsit and Covid Test.  Will add to August wait list.

## 2019-05-20 NOTE — Telephone Encounter (Signed)
Pt 's wife [name] calling to r/s pt's procedure at [place] on 7/13 because they will be out of town. She is requesting the latest appt in the morning so pt does not have to get up too early to do second part of prep. They will miss Covid test due to being out of town. They will be returning on July 12th. appts need to be after that date.

## 2019-05-21 ENCOUNTER — Ambulatory Visit: Payer: Medicare Other | Admitting: Orthopaedic Surgery

## 2019-05-21 ENCOUNTER — Encounter: Payer: Self-pay | Admitting: Orthopaedic Surgery

## 2019-05-21 ENCOUNTER — Other Ambulatory Visit: Payer: Self-pay

## 2019-05-21 ENCOUNTER — Ambulatory Visit: Payer: Self-pay

## 2019-05-21 DIAGNOSIS — G8929 Other chronic pain: Secondary | ICD-10-CM | POA: Diagnosis not present

## 2019-05-21 DIAGNOSIS — M25511 Pain in right shoulder: Secondary | ICD-10-CM

## 2019-05-21 NOTE — Progress Notes (Signed)
Office Visit Note   Patient: Michael [name]           Date of Birth: 02/01/1932           MRN: [id number] Visit Date: 05/21/2019              Requested by: Binnie Rail, MD [place],   [id number] PCP: Binnie Rail, MD   Assessment & Plan: Visit Diagnoses:  1. Chronic right shoulder pain     Plan: Impression is right AC joint arthrosis.  I believe that this is an exacerbation of his underlying AC joint arthrosis.  I recommend relative rest and oral NSAIDs as well as Voltaren gel and progress and increase activity as tolerated.  Questions encouraged and answered.  Follow-up as needed.  Follow-Up Instructions: Return if symptoms worsen or fail to improve.   Orders:  Orders Placed This Encounter  Procedures  . XR Shoulder Right   No orders of the defined types were placed in this encounter.     Procedures: No procedures performed   Clinical Data: No additional findings.   Subjective: Chief Complaint  Patient presents with  . Right Shoulder - Pain    Michael [name] is a 86 year old gentleman that I took care of last year for a right clavicle fracture that healed well.  Recently he has had some pain on the top of his right shoulder for about 3 to 4 weeks without any known injuries or trauma.  He does play with tennis on Wednesdays.  He notices a little bit of weakness due to the pain.  Takes Tylenol as needed.  Denies any radicular symptoms.  Denies any true weakness.   Review of Systems  Constitutional: Negative.   All other systems reviewed and are negative.    Objective: Vital Signs: There were no vitals taken for this visit.  Physical Exam Vitals and nursing note reviewed.  Constitutional:      Appearance: He is well-developed.  Pulmonary:     Effort: Pulmonary effort is normal.  Abdominal:     Palpations: Abdomen is soft.  Skin:    General: Skin is warm.  Neurological:     Mental Status: He is alert and oriented to person,  place, and time.  Psychiatric:        Behavior: Behavior normal.        Thought Content: Thought content normal.        Judgment: Judgment normal.     Ortho Exam Right shoulder shows full range of motion without significant discomfort.  Manual muscle testing is normal.  AC joint is slightly prominent and mildly tender to palpation and mildly symptomatic to cross body adduction.  Negative Hawkins and negative Neer impingement. Specialty Comments:  No specialty comments available.  Imaging: XR Shoulder Right  Result Date: 05/21/2019 Healed distal clavicle fracture.  AC joint arthrosis.    PMFS History: Patient Active Problem List   Diagnosis Date Noted  . Overactive bladder 05/06/2019  . Memory changes 05/06/2019  . Polyp of large intestine   . Asymptomatic varicose veins of left lower extremity 12/16/2017  . Hyperglycemia 11/02/2017  . 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 Clavicle fracture 11/02/2017  . Vitamin D deficiency 11/02/2017  . History of DVT of lower extremity, left leg 07/2017 08/08/2017  . SBO (small bowel obstruction) ([place]) 07/05/2017  . Bilateral hearing loss 05/31/2017  . B12 deficiency 02/06/2017  . Osteopenia 02/06/2017  . Glaucoma 12/12/2015  . Crohn's disease (  [place]) 12/12/2015  . Anemia 12/12/2015  . Retinal ischemia 12/06/2011   Past Medical History:  Diagnosis Date  . Adrenal insufficiency ([place])   . Anemia   . Avascular necrosis ([place])   . Crohn's disease ([place])   . Glaucoma   . Inguinal hernia recurrent bilateral   . Low testosterone   . Osteopenia   . Retinal ischemia     Family History  Problem Relation Age of Onset  . Deep vein thrombosis Mother   . Heart disease Father   . Stomach cancer Neg Hx   . Colon cancer Neg Hx   . Esophageal cancer Neg Hx   . Pancreatic cancer Neg Hx     Past Surgical History:  Procedure Laterality Date  . ABDOMINAL SURGERY    . COLONOSCOPY N/A 12/13/2015   Procedure: COLONOSCOPY;  Surgeon: Laurence Spates, MD;   Location: [facility];  Service: Endoscopy;  Laterality: N/A;  . COLONOSCOPY WITH PROPOFOL N/A 03/25/2019   Procedure: COLONOSCOPY WITH PROPOFOL;  Surgeon: Yetta Flock, MD;  Location: [facility];  Service: Gastroenterology;  Laterality: N/A;  . INGUINAL HERNIA REPAIR Bilateral 05/21/2014   Procedure: OPEN BILATERAL INGUINAL HERNIA REPAIRS WITH MESH;  Surgeon: Donnie Mesa, MD;  Location: [place];  Service: General;  Laterality: Bilateral;  . POLYPECTOMY  03/25/2019   Procedure: POLYPECTOMY;  Surgeon: Yetta Flock, MD;  Location: [facility];  Service: Gastroenterology;;  . SMALL INTESTINE SURGERY  1968, 2001    related to Chrohn's disease  . TONSILLECTOMY     Social History   Occupational History  . Occupation: Retired  Tobacco Use  . Smoking status: Former Smoker    Quit date: 04/01/1976    Years since quitting: 43.1  . Smokeless tobacco: Never Used  Substance and Sexual Activity  . Alcohol use: Yes    Comment: 2-3  glass wine  weekly  1-2 cans beer/ week  . Drug use: No  . Sexual activity: Never

## 2019-05-22 NOTE — Telephone Encounter (Signed)
Patients wife returned call Suanne Marker). She sates that they cannot do the COVID screening on the date provided as they will out of town July 4th week. She didn't want to cancel the procedure, that was a miscommunication. She wanted to change the COVID testing date, and also change the current procedure time, she wants the latest time available for the colonoscopy and doesn't want Mr Aken drinking the prep that early in the morning. I did explain that the hospital times are limited and she suggested that we review the cases on for June and have a case moved for them to possibly have a June date. If the patient is on for the August schedule would this be ok? Please advise

## 2019-05-22 NOTE — Telephone Encounter (Signed)
Patient's wife called stating she had previously requested a call back please call her

## 2019-05-22 NOTE — Telephone Encounter (Signed)
Patient has been informed. And aware he will be on the wait-list.

## 2019-05-22 NOTE — Telephone Encounter (Signed)
Thanks Nicole Kindred. Okay with me if we schedule this exam in August if he wants to do that. Thanks

## 2019-05-23 IMAGING — CR DG SHOULDER 2+V*R*
2 series · 2 of 2 positions shown · non-contrast
Comparison: None.

CLINICAL DATA: Fell [place] the steps at church.  Shoulder pain.

EXAM:
RIGHT SHOULDER - 2+ VIEW

[w shoulder grashey right]
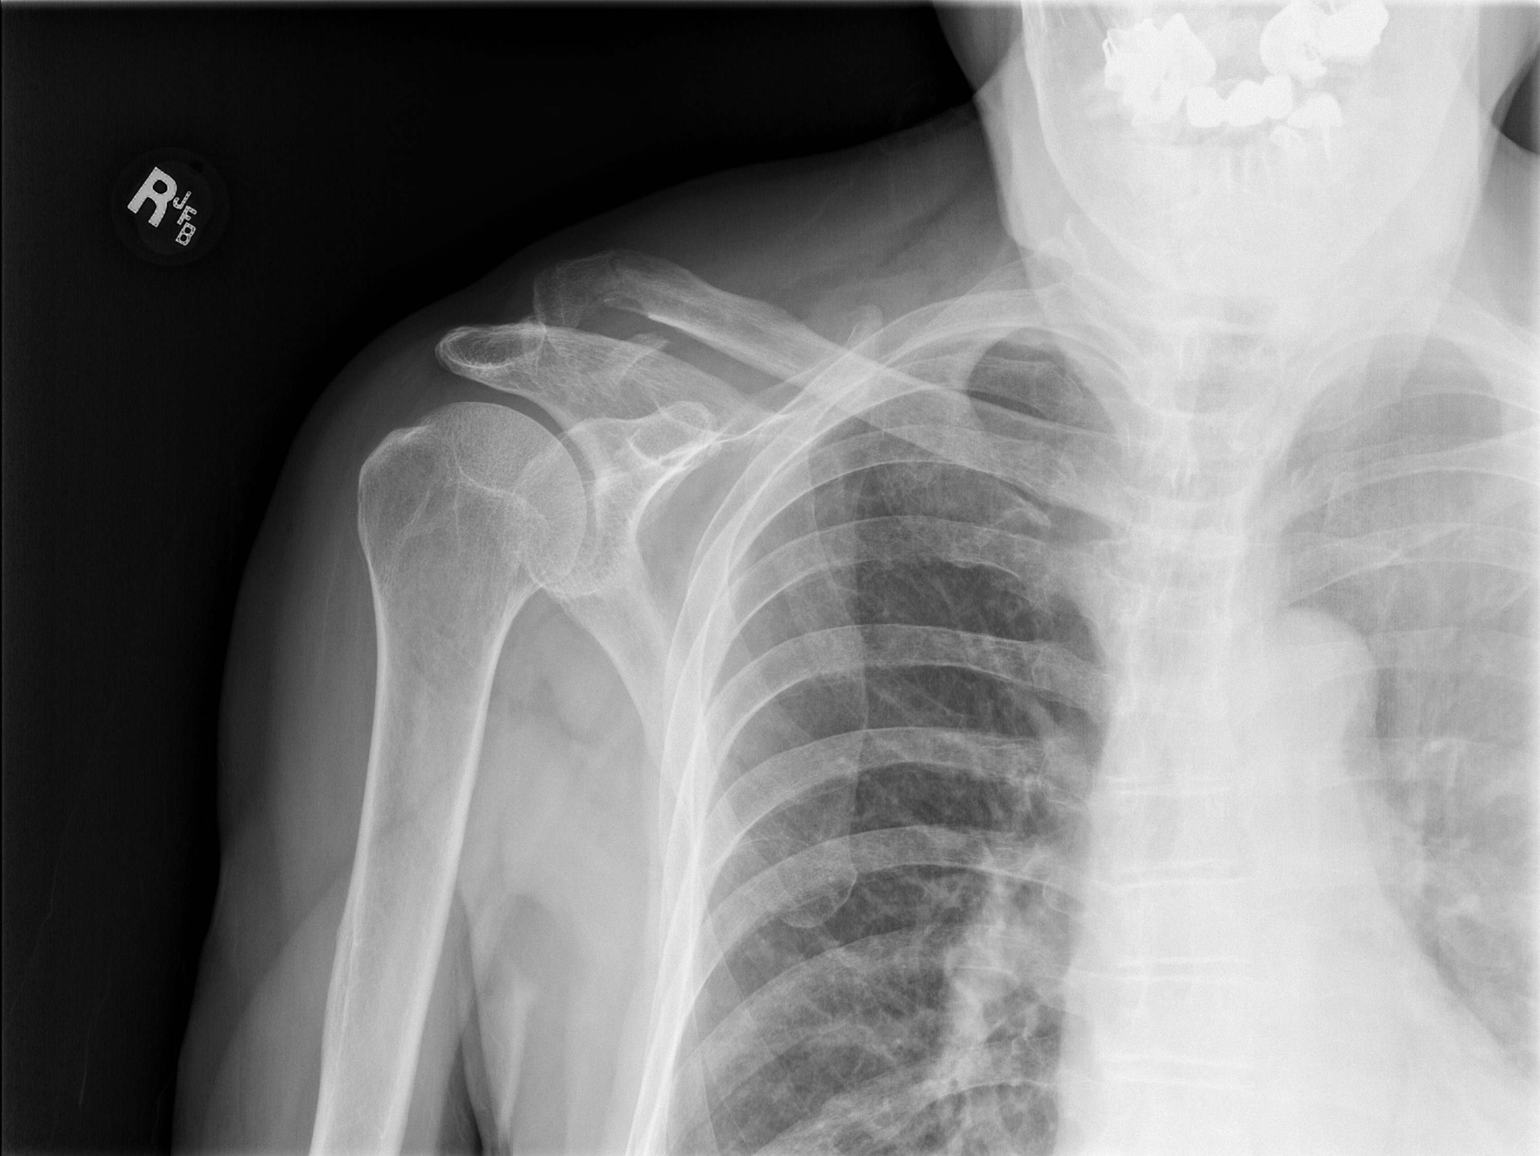

[w shoulder y view right]
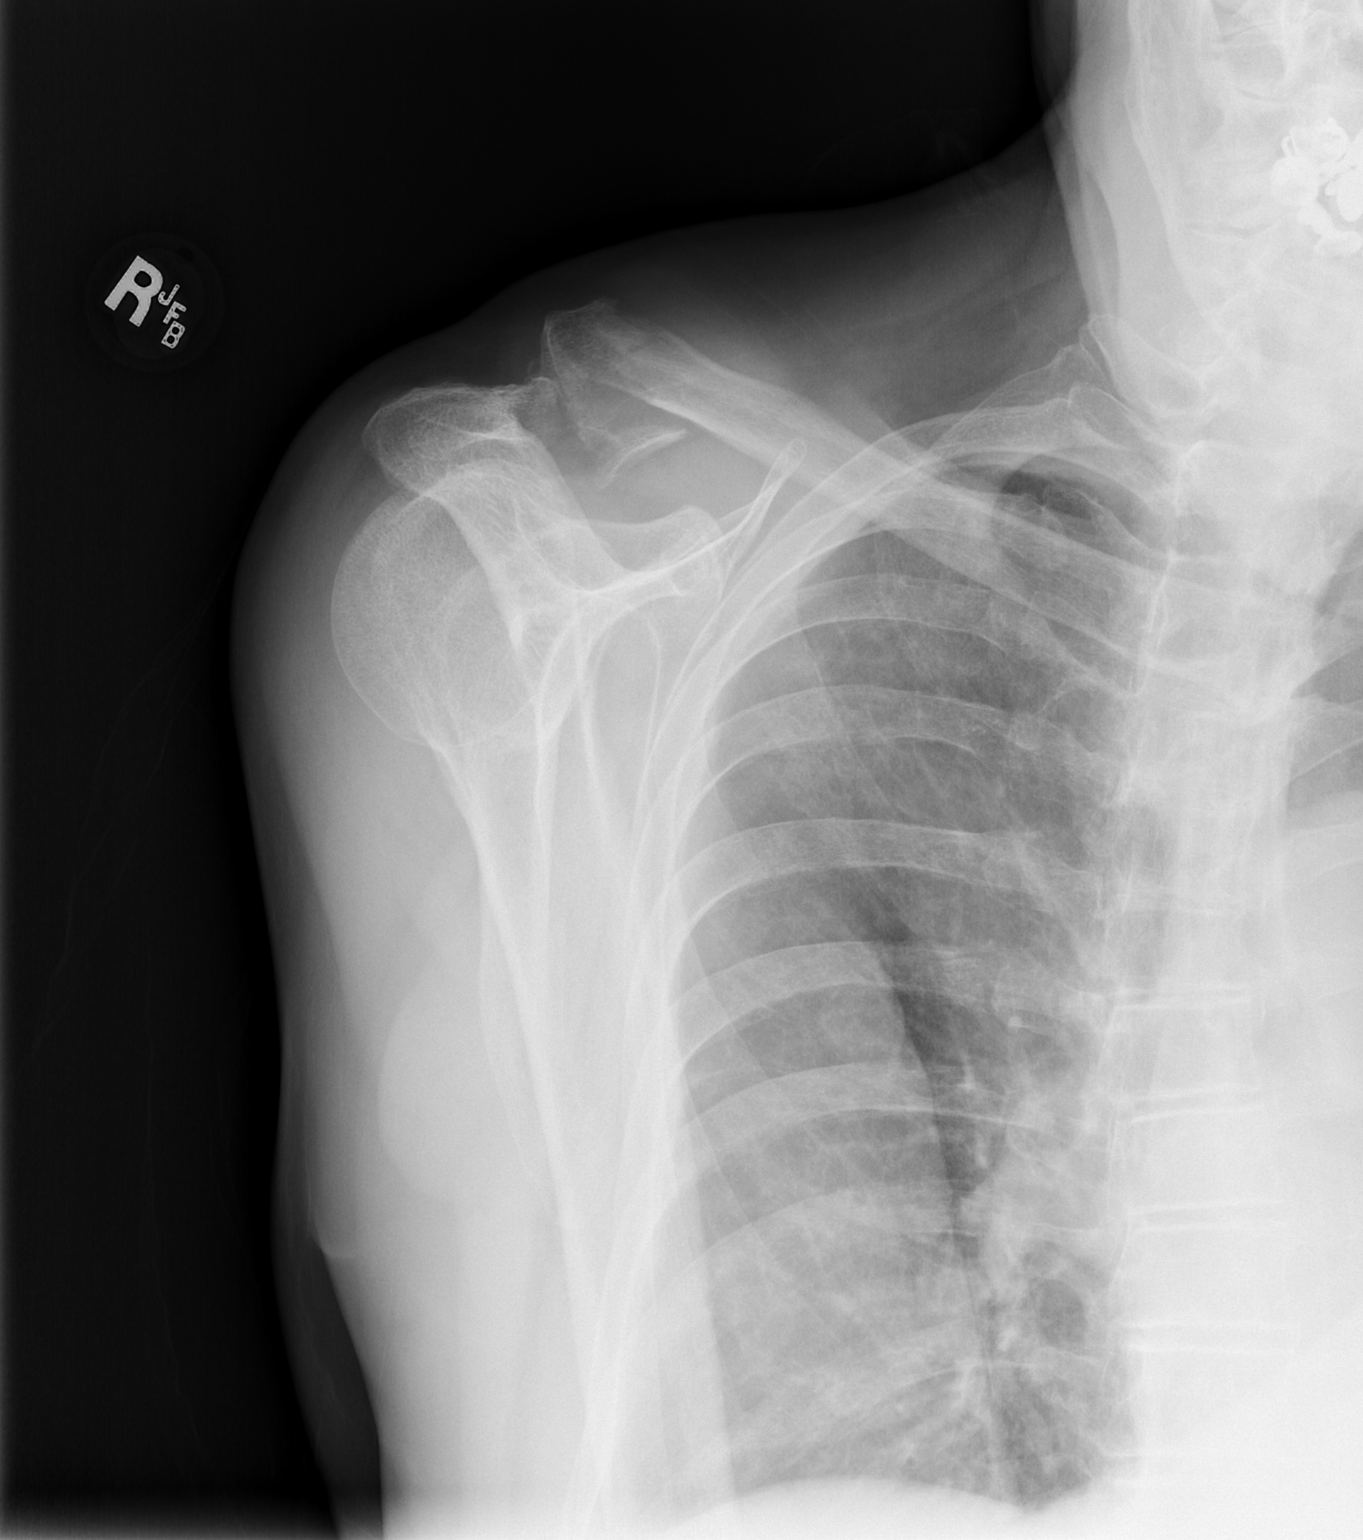

[2 of 2 positions shown; findings below may reference images not displayed]

FINDINGS: Comminuted fracture of the distal clavicle. Fragment attached to the
coracoclavicular ligament remains in place, while the remainder of
the distal clavicle is displaced upward. No evidence of scapular or
humeral fracture. Regional ribs appear normal.
IMPRESSION: Comminuted fracture of the distal clavicle including a fragment
attached to the coracoclavicular ligament remaining in place, with
the remainder of the distal clavicle displaced upwards.

## 2019-05-23 NOTE — Progress Notes (Signed)
Subjective:    Patient ID: Michael [name], male    DOB: 08-13-1932, 86 y.o.   MRN: [id number]  HPI The patient is here for an acute visit.   Wax in both ears.  Was getting new hearing aids and was told his ears need to be cleaned out.  No pain or discomfort in ears.  No cold symptoms, fever or chills.        Medications and allergies reviewed with patient and updated if appropriate.  Patient Active Problem List   Diagnosis Date Noted  . Overactive bladder 05/06/2019  . Memory changes 05/06/2019  . Polyp of large intestine   . Asymptomatic varicose veins of left lower extremity 12/16/2017  . Hyperglycemia 11/02/2017  . 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 Clavicle fracture 11/02/2017  . Vitamin D deficiency 11/02/2017  . History of DVT of lower extremity, left leg 07/2017 08/08/2017  . SBO (small bowel obstruction) ([place]) 07/05/2017  . Bilateral hearing loss 05/31/2017  . B12 deficiency 02/06/2017  . Osteopenia 02/06/2017  . Glaucoma 12/12/2015  . Crohn's disease ([place]) 12/12/2015  . Anemia 12/12/2015  . 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 Ascorbic Acid (VITAMIN C) 1000 MG tablet Take 1,000 mg by mouth daily.    Marland Kitchen augmented betamethasone dipropionate (DIPROLENE-AF) 0.05 % cream Apply 1 application topically daily as needed (itching).     . brimonidine (ALPHAGAN) 0.2 % ophthalmic solution Place 1 drop into the left eye in the morning and at bedtime.     . budesonide (ENTOCORT EC) 3 MG 24 hr capsule Take 3 capsules (9 mg total) by mouth daily. Take 59m daily for 4 weeks, then 662mdaily for 2 weeks, then 3 mg daily for 2 weeks, then done 126 capsule 0  . Calcium Carbonate-Vit D-Min (CALCIUM 1200 PO) Take 2 tablets by mouth daily.     . Cholecalciferol (VITAMIN D) 2000 units CAPS Take 1 capsule (2,000 Units total) by mouth daily. 30 capsule   . cyanocobalamin (,VITAMIN B-12,) 1000 MCG/ML injection  INJECT 1ML INTO THE MUSCLE EVERY 30 DAYS 10 mL 0  . dorzolamide-timolol (COSOPT) 22.3-6.8 MG/ML ophthalmic solution Place 1 drop into the left eye 2 (two) times daily.     . ferrous sulfate 325 (65 FE) MG tablet Take 325 mg by mouth every Monday, Wednesday, and Friday.    . latanoprost (XALATAN) 0.005 % ophthalmic solution Place 1 drop into the left eye at bedtime.     . Marland Kitchenoperamide (IMODIUM A-D) 2 MG tablet Take 2 mg by mouth 3 (three) times daily as needed for diarrhea or loose stools.    . Mckinley Jewelimesylate (RHOPRESSA) 0.02 % SOLN Place 1 drop into the left eye at bedtime.     . Marland Kitchenmeprazole (PRILOSEC) 20 MG capsule Take 20 mg by mouth daily.     . prednisoLONE acetate (PRED FORTE) 1 % ophthalmic suspension Place 1 drop into the right eye in the morning, at noon, in the evening, and at bedtime.      No current facility-administered medications on file prior to visit.    Past Medical History:  Diagnosis Date  . Adrenal insufficiency ([place]  . Anemia   . Avascular necrosis ([facility]  . Crohn's disease ([place]  . Glaucoma   . Inguinal hernia recurrent bilateral   . Low testosterone   . Osteopenia   . Retinal ischemia  Past Surgical History:  Procedure Laterality Date  . ABDOMINAL SURGERY    . COLONOSCOPY N/A 12/13/2015   Procedure: COLONOSCOPY;  Surgeon: Laurence Spates, MD;  [facility];  Service: Endoscopy;  Laterality: N/A;  . COLONOSCOPY WITH PROPOFOL N/A 03/25/2019   Procedure: COLONOSCOPY WITH PROPOFOL;  Surgeon: Yetta Flock, MD;  [facility];  Service: Gastroenterology;  Laterality: N/A;  . INGUINAL HERNIA REPAIR Bilateral 05/21/2014   Procedure: OPEN BILATERAL INGUINAL HERNIA REPAIRS WITH MESH;  Surgeon: Donnie Mesa, MD;  Location: [place];  Service: General;  Laterality: Bilateral;  . POLYPECTOMY  03/25/2019   Procedure: POLYPECTOMY;  Surgeon: Yetta Flock, MD;  [facility];  Service: Gastroenterology;;  .  SMALL INTESTINE SURGERY  1968, 2001    related to Chrohn's disease  . TONSILLECTOMY      Social History   Socioeconomic History  . Marital status: Married    Spouse name: Not on file  . Number of children: 3  . Years of education: Not on file  . Highest education level: Not on file  Occupational History  . Occupation: Retired  Tobacco Use  . Smoking status: Former Smoker    Quit date: 04/01/1976    Years since quitting: 43.1  . Smokeless tobacco: Never Used  Substance and Sexual Activity  . Alcohol use: Yes    Comment: 2-3  glass wine  weekly  1-2 cans beer/ week  . Drug use: No  . Sexual activity: Never  Other Topics Concern  . Not on file  Social History Narrative  . Not on file   Social Determinants of Health   Financial Resource Strain:   . Difficulty of Paying Living Expenses:   Food Insecurity:   . Worried About Charity fundraiser in the Last Year:   . Arboriculturist in the Last Year:   Transportation Needs:   . Film/video editor (Medical):   Marland Kitchen Lack of Transportation (Non-Medical):   Physical Activity:   . Days of Exercise per Week:   . Minutes of Exercise per Session:   Stress:   . Feeling of Stress :   Social Connections:   . Frequency of Communication with Friends and Family:   . Frequency of Social Gatherings with Friends and Family:   . Attends Religious Services:   . Active Member of Clubs or Organizations:   . Attends Archivist Meetings:   Marland Kitchen Marital Status:     Family History  Problem Relation Age of Onset  . Deep vein thrombosis Mother   . Heart disease Father   . Stomach cancer Neg Hx   . Colon cancer Neg Hx   . Esophageal cancer Neg Hx   . Pancreatic cancer Neg Hx     Review of Systems Per HPI    Objective:   Vitals:   05/24/19 0937  BP: 126/70  Pulse: 77  Resp: 16  Temp: 97.9 F (36.6 C)  SpO2: 97%   BP Readings from Last 3 Encounters:  05/24/19 126/70  05/06/19 140/80  05/02/19 (!) 142/86   Wt  Readings from Last 3 Encounters:  05/24/19 180 lb 12.8 oz (82 kg)  05/06/19 179 lb 12.8 oz (81.6 kg)  05/02/19 182 lb 6 oz (82.7 kg)   Body mass index is 25.22 kg/m.   Physical Exam    PRE-PROCEDURE EXAM: Right TM and left TM cannot be visualized due to total occlusion/impaction of the ear canal.  No ear  canal erythema bilaterally.  External ear bilaterally normal.  No cervical lymphadenopathy.  PROCEDURE INDICATION: remove wax to visualize ear drum & improve hearing CONSENT:  Verbal  PROCEDURE NOTE:   See documentation by CMA     Assessment & Plan:    See Problem List for Assessment and Plan of chronic medical problems.    This visit occurred during the SARS-CoV-2 public health emergency.  Safety protocols were in place, including screening questions prior to the visit, additional usage of staff PPE, and extensive cleaning of exam room while observing appropriate contact time as indicated for disinfecting solutions.

## 2019-05-23 NOTE — Telephone Encounter (Signed)
Called and spoke to patient. Explained that pt will have to be covid tested on a particular day based on date of colonoscopy.  Dr. Havery Moros confirmed it is OK for him to wait until August. We will call them as soon as August becomes available to schedule.

## 2019-05-24 ENCOUNTER — Other Ambulatory Visit: Payer: Self-pay

## 2019-05-24 ENCOUNTER — Ambulatory Visit (INDEPENDENT_AMBULATORY_CARE_PROVIDER_SITE_OTHER): Payer: Medicare Other | Admitting: Internal Medicine

## 2019-05-24 ENCOUNTER — Encounter: Payer: Self-pay | Admitting: Internal Medicine

## 2019-05-24 DIAGNOSIS — H9193 Unspecified hearing loss, bilateral: Secondary | ICD-10-CM | POA: Diagnosis not present

## 2019-05-24 DIAGNOSIS — H6123 Impacted cerumen, bilateral: Secondary | ICD-10-CM | POA: Diagnosis not present

## 2019-05-24 NOTE — Progress Notes (Signed)
Patient consent obtained. Irrigation with water and peroxide performed. Full view of tympanic membranes after procedure.  Patient tolerated procedure well. Bilateral ears. Both ears clear

## 2019-05-25 DIAGNOSIS — H6123 Impacted cerumen, bilateral: Secondary | ICD-10-CM | POA: Insufficient documentation

## 2019-05-25 NOTE — Assessment & Plan Note (Signed)
Acute on chronic Hearing is worse than usual.  He typically wears hearing aids, but has bilateral impacted cerumen, which is causing worsening of his current hearing loss Bilateral ears cleaned out today-hearing still decreased, but improved

## 2019-05-25 NOTE — Assessment & Plan Note (Signed)
Acute bilateral impacted cerumen Resulting in hearing loss-no pain or discomfort CMA successfully removed wax from both ears-see procedure note

## 2019-06-03 ENCOUNTER — Encounter: Payer: Self-pay | Admitting: Internal Medicine

## 2019-06-03 ENCOUNTER — Other Ambulatory Visit: Payer: Self-pay

## 2019-06-03 ENCOUNTER — Ambulatory Visit (INDEPENDENT_AMBULATORY_CARE_PROVIDER_SITE_OTHER): Payer: Medicare Other | Admitting: Internal Medicine

## 2019-06-03 VITALS — BP 136/74 | HR 74 | Temp 98.0°F | Resp 16 | Ht 71.0 in | Wt 180.0 lb

## 2019-06-03 DIAGNOSIS — H9203 Otalgia, bilateral: Secondary | ICD-10-CM | POA: Diagnosis not present

## 2019-06-03 NOTE — Assessment & Plan Note (Signed)
Acute Feeling of water in b/l ears R> L Ear exam is normal Getting new hearing aids tomorrow, which may help -- if symptoms persist will refer to ENT

## 2019-06-03 NOTE — Progress Notes (Signed)
Subjective:    Patient ID: Michael [name], male    DOB: 1932-10-28, 86 y.o.   MRN: [id number]  HPI The patient is here for an acute visit for water in ears.  He had his ears cleaned out 5/14 here.  Both ears feel like there is water in them.  This started a couple days after he was here. The right ear is worse.     Tomorrow he gets new hearing aids.   Medications and allergies reviewed with patient and updated if appropriate.  Patient Active Problem List   Diagnosis Date Noted  . Bilateral impacted cerumen 05/25/2019  . Overactive bladder 05/06/2019  . Memory changes 05/06/2019  . Polyp of large intestine   . Asymptomatic varicose veins of left lower extremity 12/16/2017  . Hyperglycemia 11/02/2017  . 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 Clavicle fracture 11/02/2017  . Vitamin D deficiency 11/02/2017  . History of DVT of lower extremity, left leg 07/2017 08/08/2017  . SBO (small bowel obstruction) ([place]) 07/05/2017  . Bilateral hearing loss 05/31/2017  . B12 deficiency 02/06/2017  . Osteopenia 02/06/2017  . Glaucoma 12/12/2015  . Crohn's disease ([place]) 12/12/2015  . Anemia 12/12/2015  . 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 Ascorbic Acid (VITAMIN C) 1000 MG tablet Take 1,000 mg by mouth daily.    Marland Kitchen augmented betamethasone dipropionate (DIPROLENE-AF) 0.05 % cream Apply 1 application topically daily as needed (itching).     . brimonidine (ALPHAGAN) 0.2 % ophthalmic solution Place 1 drop into the left eye in the morning and at bedtime.     . budesonide (ENTOCORT EC) 3 MG 24 hr capsule Take 3 capsules (9 mg total) by mouth daily. Take 18m daily for 4 weeks, then 679mdaily for 2 weeks, then 3 mg daily for 2 weeks, then done 126 capsule 0  . Calcium Carbonate-Vit D-Min (CALCIUM 1200 PO) Take 2 tablets by mouth daily.     . Cholecalciferol (VITAMIN D) 2000 units CAPS Take 1 capsule (2,000 Units total)  by mouth daily. 30 capsule   . cyanocobalamin (,VITAMIN B-12,) 1000 MCG/ML injection INJECT 1ML INTO THE MUSCLE EVERY 30 DAYS 10 mL 0  . dorzolamide-timolol (COSOPT) 22.3-6.8 MG/ML ophthalmic solution Place 1 drop into the left eye 2 (two) times daily.     . ferrous sulfate 325 (65 FE) MG tablet Take 325 mg by mouth every Monday, Wednesday, and Friday.    . latanoprost (XALATAN) 0.005 % ophthalmic solution Place 1 drop into the left eye at bedtime.     . Marland Kitchenoperamide (IMODIUM A-D) 2 MG tablet Take 2 mg by mouth 3 (three) times daily as needed for diarrhea or loose stools.    . Mckinley Jewelimesylate (RHOPRESSA) 0.02 % SOLN Place 1 drop into the left eye at bedtime.     . Marland Kitchenmeprazole (PRILOSEC) 20 MG capsule Take 20 mg by mouth daily.     . prednisoLONE acetate (PRED FORTE) 1 % ophthalmic suspension Place 1 drop into the right eye in the morning, at noon, in the evening, and at bedtime.      No current facility-administered medications on file prior to visit.    Past Medical History:  Diagnosis Date  . Ad[place]al insufficiency ([place]  . Anemia   . Avascular necrosis ([facility]  . Crohn's disease ([facility]  . Glaucoma   . Inguinal hernia recurrent bilateral   . Low testosterone   .  Osteopenia   . Retinal ischemia     Past Surgical History:  Procedure Laterality Date  . ABDOMINAL SURGERY    . COLONOSCOPY N/A 12/13/2015   Procedure: COLONOSCOPY;  Surgeon: Laurence Spates, MD;  Location: [facility];  Service: Endoscopy;  Laterality: N/A;  . COLONOSCOPY WITH PROPOFOL N/A 03/25/2019   Procedure: COLONOSCOPY WITH PROPOFOL;  Surgeon: Yetta Flock, MD;  Location[facility];  Service: Gastroenterology;  Laterality: N/A;  . INGUINAL HERNIA REPAIR Bilateral 05/21/2014   Procedure: OPEN BILATERAL INGUINAL HERNIA REPAIRS WITH MESH;  Surgeon: Donnie Mesa, MD;  Location: [facility];  Service: General;  Laterality: Bilateral;  . POLYPECTOMY  03/25/2019   Procedure: POLYPECTOMY;  Surgeon:  Yetta Flock, MD;  [facility];  Service: Gastroenterology;;  . SMALL INTESTINE SURGERY  1968, 2001    related to Chrohn's disease  . TONSILLECTOMY      Social History   Socioeconomic History  . Marital status: Married    Spouse name: Not on file  . Number of children: 3  . Years of education: Not on file  . Highest education level: Not on file  Occupational History  . Occupation: Retired  Tobacco Use  . Smoking status: Former Smoker    Quit date: 04/01/1976    Years since quitting: 43.2  . Smokeless tobacco: Never Used  Substance and Sexual Activity  . Alcohol use: Yes    Comment: 2-3  glass wine  weekly  1-2 cans beer/ week  . Drug use: No  . Sexual activity: Never  Other Topics Concern  . Not on file  Social History Narrative  . Not on file   Social Determinants of Health   Financial Resource Strain:   . Difficulty of Paying Living Expenses:   Food Insecurity:   . Worried About Charity fundraiser in the Last Year:   . Arboriculturist in the Last Year:   Transportation Needs:   . Film/video editor (Medical):   Marland Kitchen Lack of Transportation (Non-Medical):   Physical Activity:   . Days of Exercise per Week:   . Minutes of Exercise per Session:   Stress:   . Feeling of Stress :   Social Connections:   . Frequency of Communication with Friends and Family:   . Frequency of Social Gatherings with Friends and Family:   . Attends Religious Services:   . Active Member of Clubs or Organizations:   . Attends Archivist Meetings:   Marland Kitchen Marital Status:     Family History  Problem Relation Age of Onset  . Deep vein thrombosis Mother   . Heart disease Father   . Stomach cancer Neg Hx   . Colon cancer Neg Hx   . Esophageal cancer Neg Hx   . Pancreatic cancer Neg Hx     Review of Systems  Constitutional: Negative for fever.  HENT: Negative for congestion, ear discharge, ear pain, sinus pressure and sore throat.        No ear pressure of  popping  Neurological: Negative for headaches.       Objective:   Vitals:   06/03/19 1049  BP: 136/74  Pulse: 74  Resp: 16  Temp: 98 F (36.7 C)  SpO2: 97%   BP Readings from Last 3 Encounters:  06/03/19 136/74  05/24/19 126/70  05/06/19 140/80   Wt Readings from Last 3 Encounters:  06/03/19 180 lb (81.6 kg)  05/24/19 180 lb 12.8 oz (82 kg)  05/06/19 179 lb 12.8 oz (81.6 kg)   Body mass index is 25.1 kg/m.   Physical Exam Constitutional:      General: He is not in acute distress.    Appearance: Normal appearance. He is not ill-appearing.  HENT:     Head: Normocephalic and atraumatic.     Right Ear: Ear canal and external ear normal. There is no impacted cerumen.     Left Ear: Tympanic membrane, ear canal and external ear normal. There is no impacted cerumen.     Ears:     Comments: Perforated R TM that appears chronic - no erythema    Nose: Nose normal.     Mouth/Throat:     Mouth: Mucous membranes are moist.     Pharynx: No posterior oropharyngeal erythema.  Eyes:     Conjunctiva/sclera: Conjunctivae normal.  Skin:    General: Skin is warm and dry.  Neurological:     Mental Status: He is alert.            Assessment & Plan:    See Problem List for Assessment and Plan of chronic medical problems.    This visit occurred during the SARS-CoV-2 public health emergency.  Safety protocols were in place, including screening questions prior to the visit, additional usage of staff PPE, and extensive cleaning of exam room while observing appropriate contact time as indicated for disinfecting solutions.

## 2019-06-03 NOTE — Patient Instructions (Signed)
If you continue to have a feeling of water in your ear let me know and we will have you see an ear, nose and throat doctor.

## 2019-06-05 ENCOUNTER — Telehealth: Payer: Self-pay

## 2019-06-05 NOTE — Telephone Encounter (Signed)
Pt scheduled for Colonoscopy with EMR at [facility] on Monday 8-16 at 11:30am to arrive at 10:00am.  Patient will need to be Covid tested on Thursday, 8-12 at 2:30pm.  Previsit scheduled for Tuesday, 8-3 at 2:30pm.  Gershon Crane, pt's spouse and gave her all the dates and times.  She wrote down all the information and confirmed understanding and agreement.

## 2019-06-06 ENCOUNTER — Encounter: Payer: Self-pay | Admitting: Internal Medicine

## 2019-06-06 ENCOUNTER — Ambulatory Visit (INDEPENDENT_AMBULATORY_CARE_PROVIDER_SITE_OTHER): Payer: Medicare Other | Admitting: Internal Medicine

## 2019-06-06 ENCOUNTER — Other Ambulatory Visit: Payer: Self-pay

## 2019-06-06 VITALS — BP 138/80 | HR 74 | Temp 97.7°F | Resp 16 | Ht 71.0 in | Wt 180.0 lb

## 2019-06-06 DIAGNOSIS — L600 Ingrowing nail: Secondary | ICD-10-CM | POA: Diagnosis not present

## 2019-06-06 MED ORDER — CEPHALEXIN 500 MG PO CAPS
500.0000 mg | ORAL_CAPSULE | Freq: Three times a day (TID) | ORAL | 0 refills | Status: AC
Start: 2019-06-06 — End: 2019-06-11

## 2019-06-06 NOTE — Patient Instructions (Addendum)
Take the antibiotic.   Do soak your foot in epsom salt and warm water.   If this does not improve let me know and we will set you up with a foot doctor.      Ingrown Toenail An ingrown toenail occurs when the corner or sides of a toenail grow into the surrounding skin. This causes discomfort and pain. The big toe is most commonly affected, but any of the toes can be affected. If an ingrown toenail is not treated, it can become infected. What are the causes? This condition may be caused by:  Wearing shoes that are too small or tight.  An injury, such as stubbing your toe or having your toe stepped on.  Improper cutting or care of your toenails.  Having nail or foot abnormalities that were present from birth (congenital abnormalities), such as having a nail that is too big for your toe. What increases the risk? The following factors may make you more likely to develop ingrown toenails:  Age. Nails tend to get thicker with age, so ingrown nails are more common among older people.  Cutting your toenails incorrectly, such as cutting them very short or cutting them unevenly. An ingrown toenail is more likely to get infected if you have:  Diabetes.  Blood flow (circulation) problems. What are the signs or symptoms? Symptoms of an ingrown toenail may include:  Pain, soreness, or tenderness.  Redness.  Swelling.  Hardening of the skin that surrounds the toenail. Signs that an ingrown toenail may be infected include:  Fluid or pus.  Symptoms that get worse instead of better. How is this diagnosed? An ingrown toenail may be diagnosed based on your medical history, your symptoms, and a physical exam. If you have fluid or blood coming from your toenail, a sample may be collected to test for the specific type of bacteria that is causing the infection. How is this treated? Treatment depends on how severe your ingrown toenail is. You may be able to care for your toenail at  home.  If you have an infection, you may be prescribed antibiotic medicines.  If you have fluid or pus draining from your toenail, your health care provider may drain it.  If you have trouble walking, you may be given crutches to use.  If you have a severe or infected ingrown toenail, you may need a procedure to remove part or all of the nail. Follow these instructions at home: Foot care   Do not pick at your toenail or try to remove it yourself.  Soak your foot in warm, soapy water. Do this for 20 minutes, 3 times a day, or as often as told by your health care provider. This helps to keep your toe clean and keep your skin soft.  Wear shoes that fit well and are not too tight. Your health care provider may recommend that you wear open-toed shoes while you heal.  Trim your toenails regularly and carefully. Cut your toenails straight across to prevent injury to the skin at the corners of the toenail. Do not cut your nails in a curved shape.  Keep your feet clean and dry to help prevent infection. Medicines  Take over-the-counter and prescription medicines only as told by your health care provider.  If you were prescribed an antibiotic, take it as told by your health care provider. Do not stop taking the antibiotic even if you start to feel better. Activity  Return to your normal activities as told by your  health care provider. Ask your health care provider what activities are safe for you.  Avoid activities that cause pain. General instructions  If your health care provider told you to use crutches to help you move around, use them as instructed.  Keep all follow-up visits as told by your health care provider. This is important. Contact a health care provider if:  You have more redness, swelling, pain, or other symptoms that do not improve with treatment.  You have fluid, blood, or pus coming from your toenail. Get help right away if:  You have a red streak on your skin that  starts at your foot and spreads up your leg.  You have a fever. Summary  An ingrown toenail occurs when the corner or sides of a toenail grow into the surrounding skin. This causes discomfort and pain. The big toe is most commonly affected, but any of the toes can be affected.  If an ingrown toenail is not treated, it can become infected.  Fluid or pus draining from your toenail is a sign of infection. Your health care provider may need to drain it. You may be given antibiotics to treat the infection.  Trimming your toenails regularly and properly can help you prevent an ingrown toenail. This information is not intended to replace advice given to you by your health care provider. Make sure you discuss any questions you have with your health care provider. Document Revised: 04/20/2018 Document Reviewed: 09/14/2016 Elsevier Patient Education  [place].

## 2019-06-06 NOTE — Progress Notes (Signed)
Subjective:    Patient ID: Michael [name], male    DOB: 1932/06/26, 86 y.o.   MRN: [id number]  HPI The patient is here for an acute visit.   Right first toe swelling and pain.  It started last week.  There is a little swelling and redness and it is tender to touch.  He denies injury.  He was concerned about an infection.  He denies any fever, chills, wound.      Medications and allergies reviewed with patient and updated if appropriate.  Patient Active Problem List   Diagnosis Date Noted  . Earache symptoms, bilateral 06/03/2019  . Bilateral impacted cerumen 05/25/2019  . Overactive bladder 05/06/2019  . Memory changes 05/06/2019  . Polyp of large intestine   . Asymptomatic varicose veins of left lower extremity 12/16/2017  . Hyperglycemia 11/02/2017  . 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 Clavicle fracture 11/02/2017  . Vitamin D deficiency 11/02/2017  . History of DVT of lower extremity, left leg 07/2017 08/08/2017  . SBO (small bowel obstruction) ([place]) 07/05/2017  . Bilateral hearing loss 05/31/2017  . B12 deficiency 02/06/2017  . Osteopenia 02/06/2017  . Glaucoma 12/12/2015  . Crohn's disease ([place]) 12/12/2015  . Anemia 12/12/2015  . 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 Ascorbic Acid (VITAMIN C) 1000 MG tablet Take 1,000 mg by mouth daily.    Marland Kitchen augmented betamethasone dipropionate (DIPROLENE-AF) 0.05 % cream Apply 1 application topically daily as needed (itching).     . brimonidine (ALPHAGAN) 0.2 % ophthalmic solution Place 1 drop into the left eye in the morning and at bedtime.     . budesonide (ENTOCORT EC) 3 MG 24 hr capsule Take 3 capsules (9 mg total) by mouth daily. Take 84m daily for 4 weeks, then 6108mdaily for 2 weeks, then 3 mg daily for 2 weeks, then done 126 capsule 0  . Calcium Carbonate-Vit D-Min (CALCIUM 1200 PO) Take 2 tablets by mouth daily.     . Cholecalciferol (VITAMIN D)  2000 units CAPS Take 1 capsule (2,000 Units total) by mouth daily. 30 capsule   . cyanocobalamin (,VITAMIN B-12,) 1000 MCG/ML injection INJECT 1ML INTO THE MUSCLE EVERY 30 DAYS 10 mL 0  . dorzolamide-timolol (COSOPT) 22.3-6.8 MG/ML ophthalmic solution Place 1 drop into the left eye 2 (two) times daily.     . ferrous sulfate 325 (65 FE) MG tablet Take 325 mg by mouth every Monday, Wednesday, and Friday.    . latanoprost (XALATAN) 0.005 % ophthalmic solution Place 1 drop into the left eye at bedtime.     . Marland Kitchenoperamide (IMODIUM A-D) 2 MG tablet Take 2 mg by mouth 3 (three) times daily as needed for diarrhea or loose stools.    . Mckinley Jewelimesylate (RHOPRESSA) 0.02 % SOLN Place 1 drop into the left eye at bedtime.     . Marland Kitchenmeprazole (PRILOSEC) 20 MG capsule Take 20 mg by mouth daily.     . prednisoLONE acetate (PRED FORTE) 1 % ophthalmic suspension Place 1 drop into the right eye in the morning, at noon, in the evening, and at bedtime.      No current facility-administered medications on file prior to visit.    Past Medical History:  Diagnosis Date  . Adrenal insufficiency ([facility]  . Anemia   . Avascular necrosis ([facility]  . Crohn's disease ([facility]  . Glaucoma   . Inguinal hernia recurrent  bilateral   . Low testosterone   . Osteopenia   . Retinal ischemia     Past Surgical History:  Procedure Laterality Date  . ABDOMINAL SURGERY    . COLONOSCOPY N/A 12/13/2015   Procedure: COLONOSCOPY;  Surgeon: Laurence Spates, MD;  [facility];  Service: Endoscopy;  Laterality: N/A;  . COLONOSCOPY WITH PROPOFOL N/A 03/25/2019   Procedure: COLONOSCOPY WITH PROPOFOL;  Surgeon: Yetta Flock, MD;  [facility];  Service: Gastroenterology;  Laterality: N/A;  . INGUINAL HERNIA REPAIR Bilateral 05/21/2014   Procedure: OPEN BILATERAL INGUINAL HERNIA REPAIRS WITH MESH;  Surgeon: Donnie Mesa, MD;  Location: [facility];  Service: General;  Laterality: Bilateral;  .  POLYPECTOMY  03/25/2019   Procedure: POLYPECTOMY;  Surgeon: Yetta Flock, MD;  [facility];  Service: Gastroenterology;;  . SMALL INTESTINE SURGERY  1968, 2001    related to Chrohn's disease  . TONSILLECTOMY      Social History   Socioeconomic History  . Marital status: Married    Spouse name: Not on file  . Number of children: 3  . Years of education: Not on file  . Highest education level: Not on file  Occupational History  . Occupation: Retired  Tobacco Use  . Smoking status: Former Smoker    Quit date: 04/01/1976    Years since quitting: 43.2  . Smokeless tobacco: Never Used  Substance and Sexual Activity  . Alcohol use: Yes    Comment: 2-3  glass wine  weekly  1-2 cans beer/ week  . Drug use: No  . Sexual activity: Never  Other Topics Concern  . Not on file  Social History Narrative  . Not on file   Social Determinants of Health   Financial Resource Strain:   . Difficulty of Paying Living Expenses:   Food Insecurity:   . Worried About Charity fundraiser in the Last Year:   . Arboriculturist in the Last Year:   Transportation Needs:   . Film/video editor (Medical):   Marland Kitchen Lack of Transportation (Non-Medical):   Physical Activity:   . Days of Exercise per Week:   . Minutes of Exercise per Session:   Stress:   . Feeling of Stress :   Social Connections:   . Frequency of Communication with Friends and Family:   . Frequency of Social Gatherings with Friends and Family:   . Attends Religious Services:   . Active Member of Clubs or Organizations:   . Attends Archivist Meetings:   Marland Kitchen Marital Status:     Family History  Problem Relation Age of Onset  . Deep vein thrombosis Mother   . Heart disease Father   . Stomach cancer Neg Hx   . Colon cancer Neg Hx   . Esophageal cancer Neg Hx   . Pancreatic cancer Neg Hx     Review of Systems  Constitutional: Negative for chills and fever.  Musculoskeletal: Negative for joint  swelling.  Skin: Positive for color change. Negative for wound.  Neurological: Negative for weakness and numbness.       Objective:   Vitals:   06/06/19 1116  BP: 138/80  Pulse: 74  Resp: 16  Temp: 97.7 F (36.5 C)  SpO2: 99%   BP Readings from Last 3 Encounters:  06/06/19 138/80  06/03/19 136/74  05/24/19 126/70   Wt Readings from Last 3 Encounters:  06/06/19 180 lb (81.6 kg)  06/03/19 180 lb (81.6 kg)  05/24/19 180 lb 12.8 oz (82 kg)   Body mass index is 25.1 kg/m.   Physical Exam Constitutional:      General: He is not in acute distress.    Appearance: Normal appearance. He is not ill-appearing.  Musculoskeletal:     Comments: Right first toe on medial aspect of nail there is mld erythema and tenderness along nail - more tender at distal medial nail.  No wound, no fluctuance/pus collection,   Skin:    General: Skin is warm and dry.     Findings: No lesion.  Neurological:     Mental Status: He is alert.            Assessment & Plan:    See Problem List for Assessment and Plan of chronic medical problems.     This visit occurred during the SARS-CoV-2 public health emergency.  Safety protocols were in place, including screening questions prior to the visit, additional usage of staff PPE, and extensive cleaning of exam room while observing appropriate contact time as indicated for disinfecting solutions.

## 2019-06-06 NOTE — Assessment & Plan Note (Signed)
Acute Symptoms c/w ingrown toenail with beginnings of infection that is mild Start keflex TID x 5 days Epsom salt soaks Monitor closely Call if no improvement - will refer to podiatry

## 2019-06-17 DIAGNOSIS — Z961 Presence of intraocular lens: Secondary | ICD-10-CM | POA: Diagnosis not present

## 2019-06-17 DIAGNOSIS — H04123 Dry eye syndrome of bilateral lacrimal glands: Secondary | ICD-10-CM | POA: Diagnosis not present

## 2019-06-17 DIAGNOSIS — H401133 Primary open-angle glaucoma, bilateral, severe stage: Secondary | ICD-10-CM | POA: Diagnosis not present

## 2019-06-17 DIAGNOSIS — H02055 Trichiasis without entropian left lower eyelid: Secondary | ICD-10-CM | POA: Diagnosis not present

## 2019-07-10 ENCOUNTER — Ambulatory Visit: Payer: Medicare Other | Admitting: Neurology

## 2019-07-12 ENCOUNTER — Other Ambulatory Visit: Payer: Self-pay | Admitting: Internal Medicine

## 2019-07-19 ENCOUNTER — Other Ambulatory Visit (HOSPITAL_COMMUNITY): Payer: Medicare Other

## 2019-07-22 ENCOUNTER — Ambulatory Visit: Payer: Medicare Other | Admitting: Neurology

## 2019-07-23 ENCOUNTER — Encounter (HOSPITAL_COMMUNITY): Payer: Self-pay

## 2019-07-23 ENCOUNTER — Ambulatory Visit (HOSPITAL_COMMUNITY): Admit: 2019-07-23 | Payer: Medicare Other | Admitting: Gastroenterology

## 2019-07-23 SURGERY — COLONOSCOPY WITH PROPOFOL
Anesthesia: Monitor Anesthesia Care

## 2019-08-02 ENCOUNTER — Ambulatory Visit (AMBULATORY_SURGERY_CENTER): Payer: Self-pay

## 2019-08-02 ENCOUNTER — Other Ambulatory Visit: Payer: Self-pay

## 2019-08-02 VITALS — Ht 71.0 in | Wt 182.0 lb

## 2019-08-02 DIAGNOSIS — Z86010 Personal history of colonic polyps: Secondary | ICD-10-CM

## 2019-08-02 DIAGNOSIS — K50019 Crohn's disease of small intestine with unspecified complications: Secondary | ICD-10-CM

## 2019-08-02 DIAGNOSIS — Z01818 Encounter for other preprocedural examination: Secondary | ICD-10-CM

## 2019-08-02 MED ORDER — SUTAB 1479-225-188 MG PO TABS: 1.0000 | ORAL_TABLET | ORAL | 0 refills | Status: DC

## 2019-08-02 NOTE — Progress Notes (Signed)
No egg or soy allergy known to patient  No issues with past sedation with any surgeries or procedures No intubation problems in the past  No diet pills per patient No home 02 use per patient  No blood thinners per patient  Pt denies issues with constipation  No A fib or A flutter   COVID 19 guidelines implemented in [facility] today  COVID vaccines completed on 02/2019;  COVID screening for hospital procedure scheduled on 08/22/2019 at 2:30pm at [facility]-patient is aware of appt date/time; Due to the COVID-19 pandemic we are asking patients to follow these guidelines. Please only bring one care partner. Please be aware that your care partner may wait in the car in the parking lot or if they feel like they will be too hot to wait in the car, they may wait in the lobby on the 4th floor. All care partners are required to wear a mask the entire time (we do not have any that we can provide them), they need to practice social distancing, and we will do a Covid check for all patient's and care partners when you arrive. Also we will check their temperature and your temperature. If the care partner waits in their car they need to stay in the parking lot the entire time and we will call them on their cell phone when the patient is ready for discharge so they can bring the car to the front of the building. Also all patient's will need to wear a mask into building.

## 2019-08-08 ENCOUNTER — Encounter: Payer: Self-pay | Admitting: Gastroenterology

## 2019-08-12 ENCOUNTER — Telehealth: Payer: Self-pay

## 2019-08-12 NOTE — Telephone Encounter (Signed)
Got it, thanks Jan

## 2019-08-12 NOTE — Telephone Encounter (Signed)
Called and spoke to [place], pt's wife regarding time change for pt's procedure at [facility] on 8-16.  He will need to arrive at 8:30am for the 10:00 procedure. He will need to start drinking the prep (Sutab) at 3:00am and be NPO after 6:00am. She wrote it all down and expressed understanding. She wanted to remind Dr. Havery Moros that she wants to be present when he discusses what he found with Edd Fabian after the procedure because of his cognitive state he will not be able to communicate it to her and will forget. We also discussed the change in location for Covid testing to [place], [place], [place] [address]. They are aware of his appt on 8-12 at 2:30pm.

## 2019-08-13 ENCOUNTER — Other Ambulatory Visit: Payer: Self-pay | Admitting: Gastroenterology

## 2019-08-21 ENCOUNTER — Other Ambulatory Visit: Payer: Self-pay

## 2019-08-22 ENCOUNTER — Other Ambulatory Visit (HOSPITAL_COMMUNITY)
Admission: RE | Admit: 2019-08-22 | Discharge: 2019-08-22 | Disposition: A | Discharge status: Home / Self Care | Payer: Medicare Other | Source: Ambulatory Visit | Attending: Gastroenterology | Admitting: Gastroenterology

## 2019-08-22 DIAGNOSIS — Z01812 Encounter for preprocedural laboratory examination: Secondary | ICD-10-CM | POA: Diagnosis not present

## 2019-08-22 DIAGNOSIS — Z20822 Contact with and (suspected) exposure to covid-19: Secondary | ICD-10-CM | POA: Insufficient documentation

## 2019-08-22 LAB — SARS CORONAVIRUS 2 (TAT 6-24 HRS): SARS Coronavirus 2: NEGATIVE

## 2019-08-23 NOTE — Anesthesia Preprocedure Evaluation (Addendum)
Anesthesia Evaluation  Patient identified by MRN, date of birth, ID band Patient awake    Reviewed: Allergy & Precautions, NPO status , Patient's Chart, lab work & pertinent test results  Airway Mallampati: II  TM Distance: >3 FB Neck ROM: Full    Dental  (+) Teeth Intact, Partial Upper   Pulmonary former smoker,  Quit smoking 1978   Pulmonary exam normal breath sounds clear to auscultation       Cardiovascular negative cardio ROS Normal cardiovascular exam Rhythm:Regular Rate:Normal     Neuro/Psych negative neurological ROS  negative psych ROS   GI/Hepatic Neg liver ROS, GERD  Medicated and Controlled,Ileal crohns disease, hx colonic polyps    Endo/Other  negative endocrine ROS  Renal/GU negative Renal ROS  negative genitourinary   Musculoskeletal negative musculoskeletal ROS (+)   Abdominal   Peds  Hematology  (+) Blood dyscrasia, anemia ,   Anesthesia Other Findings   Reproductive/Obstetrics negative OB ROS                           Anesthesia Physical Anesthesia Plan  ASA: II  Anesthesia Plan: MAC   Post-op Pain Management:    Induction:   PONV Risk Score and Plan: 2 and Propofol infusion and TIVA  Airway Management Planned: Natural Airway and Simple Face Mask  Additional Equipment: None  Intra-op Plan:   Post-operative Plan:   Informed Consent: I have reviewed the patients History and Physical, chart, labs and discussed the procedure including the risks, benefits and alternatives for the proposed anesthesia with the patient or authorized representative who has indicated his/her understanding and acceptance.       Plan Discussed with: CRNA  Anesthesia Plan Comments:        Anesthesia Quick Evaluation

## 2019-08-26 ENCOUNTER — Ambulatory Visit (HOSPITAL_COMMUNITY): Payer: Medicare Other | Admitting: Anesthesiology

## 2019-08-26 ENCOUNTER — Ambulatory Visit (HOSPITAL_COMMUNITY)
Admission: RE | Admit: 2019-08-26 | Discharge: 2019-08-26 | Disposition: A | Discharge status: Home / Self Care | Payer: Medicare Other | Attending: Gastroenterology | Admitting: Gastroenterology

## 2019-08-26 ENCOUNTER — Encounter (HOSPITAL_COMMUNITY): Payer: Self-pay | Admitting: Gastroenterology

## 2019-08-26 ENCOUNTER — Encounter (HOSPITAL_COMMUNITY)
Admission: RE | Disposition: A | Discharge status: Home / Self Care | Payer: Self-pay | Source: Home / Self Care | Attending: Gastroenterology

## 2019-08-26 ENCOUNTER — Other Ambulatory Visit: Payer: Self-pay

## 2019-08-26 DIAGNOSIS — Z87891 Personal history of nicotine dependence: Secondary | ICD-10-CM | POA: Insufficient documentation

## 2019-08-26 DIAGNOSIS — K5730 Diverticulosis of large intestine without perforation or abscess without bleeding: Secondary | ICD-10-CM | POA: Insufficient documentation

## 2019-08-26 DIAGNOSIS — Z980 Intestinal bypass and anastomosis status: Secondary | ICD-10-CM | POA: Diagnosis not present

## 2019-08-26 DIAGNOSIS — Z79899 Other long term (current) drug therapy: Secondary | ICD-10-CM | POA: Diagnosis not present

## 2019-08-26 DIAGNOSIS — K5000 Crohn's disease of small intestine without complications: Secondary | ICD-10-CM | POA: Diagnosis not present

## 2019-08-26 DIAGNOSIS — H409 Unspecified glaucoma: Secondary | ICD-10-CM | POA: Diagnosis not present

## 2019-08-26 DIAGNOSIS — D123 Benign neoplasm of transverse colon: Secondary | ICD-10-CM | POA: Insufficient documentation

## 2019-08-26 DIAGNOSIS — K219 Gastro-esophageal reflux disease without esophagitis: Secondary | ICD-10-CM | POA: Diagnosis not present

## 2019-08-26 DIAGNOSIS — D126 Benign neoplasm of colon, unspecified: Secondary | ICD-10-CM

## 2019-08-26 DIAGNOSIS — Z1211 Encounter for screening for malignant neoplasm of colon: Secondary | ICD-10-CM | POA: Diagnosis not present

## 2019-08-26 DIAGNOSIS — D122 Benign neoplasm of ascending colon: Secondary | ICD-10-CM | POA: Diagnosis not present

## 2019-08-26 DIAGNOSIS — Z860100 Personal history of colon polyps, unspecified: Secondary | ICD-10-CM

## 2019-08-26 DIAGNOSIS — D649 Anemia, unspecified: Secondary | ICD-10-CM | POA: Insufficient documentation

## 2019-08-26 DIAGNOSIS — Z09 Encounter for follow-up examination after completed treatment for conditions other than malignant neoplasm: Secondary | ICD-10-CM | POA: Diagnosis present

## 2019-08-26 HISTORY — PX: ENDOSCOPIC MUCOSAL RESECTION: SHX6839

## 2019-08-26 HISTORY — PX: POLYPECTOMY: SHX5525

## 2019-08-26 HISTORY — PX: COLONOSCOPY WITH PROPOFOL: SHX5780

## 2019-08-26 HISTORY — PX: SCHLEROTHERAPY: SHX5440

## 2019-08-26 SURGERY — COLONOSCOPY WITH PROPOFOL
Anesthesia: Monitor Anesthesia Care

## 2019-08-26 MED ORDER — PROPOFOL 10 MG/ML IV BOLUS
INTRAVENOUS | Status: DC | PRN
  Administered 2019-08-26: 40 mg via INTRAVENOUS

## 2019-08-26 MED ORDER — METHYLENE BLUE 0.5 % INJ SOLN
INTRAVENOUS | Status: AC
  Filled 2019-08-26: qty 10

## 2019-08-26 MED ORDER — GLUCAGON HCL RDNA (DIAGNOSTIC) 1 MG IJ SOLR
INTRAMUSCULAR | Status: AC
  Filled 2019-08-26: qty 1

## 2019-08-26 MED ORDER — PROPOFOL 500 MG/50ML IV EMUL
INTRAVENOUS | Status: AC
  Filled 2019-08-26: qty 50

## 2019-08-26 MED ORDER — GLUCAGON HCL RDNA (DIAGNOSTIC) 1 MG IJ SOLR
INTRAMUSCULAR | Status: DC | PRN
Start: 2019-08-26 — End: 2019-08-26
  Administered 2019-08-26 (×2): 1 mg via INTRAVENOUS

## 2019-08-26 MED ORDER — PROPOFOL 500 MG/50ML IV EMUL
INTRAVENOUS | Status: DC | PRN
  Administered 2019-08-26: 125 ug/kg/min via INTRAVENOUS

## 2019-08-26 MED ORDER — LACTATED RINGERS IV SOLN: INTRAVENOUS | Status: DC | PRN

## 2019-08-26 MED ORDER — LACTATED RINGERS IV SOLN
INTRAVENOUS | Status: DC
  Administered 2019-08-26: 1000 mL via INTRAVENOUS

## 2019-08-26 MED ORDER — PROPOFOL 10 MG/ML IV BOLUS
INTRAVENOUS | Status: AC
  Filled 2019-08-26: qty 20

## 2019-08-26 MED ORDER — SODIUM CHLORIDE 0.9 % IV SOLN: INTRAVENOUS | Status: DC

## 2019-08-26 MED ORDER — METHYLENE BLUE 0.5 % INJ SOLN
INTRAVENOUS | Status: DC | PRN
  Administered 2019-08-26: 15 mL

## 2019-08-26 MED ORDER — GLUCAGON HCL RDNA (DIAGNOSTIC) 1 MG IJ SOLR
INTRAMUSCULAR | Status: AC
  Filled 2019-08-26: qty 2

## 2019-08-26 MED ORDER — LIDOCAINE 2% (20 MG/ML) 5 ML SYRINGE
INTRAMUSCULAR | Status: DC | PRN
  Administered 2019-08-26: 60 mg via INTRAVENOUS

## 2019-08-26 SURGICAL SUPPLY — 21 items

## 2019-08-26 NOTE — H&P (Signed)
HPI:   Michael [name] is a 86 y.o. male with longstanding Crohn's disease and numerous colon polyps ( > 30) in recent year, some large, near prior surgical anastomosis. His case has been discussed at multi-D meeting with surgery, hoping to avoid colectomy in this elderly patient, have recommended close surveillance colonoscopy. He is feeling well without complaints today, wishes to proceed with colonoscopy.  Past Medical History:  Diagnosis Date  . Adrenal insufficiency ([place])   . Anemia   . Avascular necrosis ([place])   . Crohn's disease ([place])   . Glaucoma   . Inguinal hernia recurrent bilateral   . Low testosterone   . Osteopenia   . Retinal ischemia     Past Surgical History:  Procedure Laterality Date  . ABDOMINAL SURGERY    . COLONOSCOPY N/A 12/13/2015   Procedure: COLONOSCOPY;  Surgeon: Laurence Spates, MD;  Location: [facility];  Service: Endoscopy;  Laterality: N/A;  . COLONOSCOPY WITH PROPOFOL N/A 03/25/2019   Procedure: COLONOSCOPY WITH PROPOFOL;  Surgeon: Yetta Flock, MD;  Location: [facility];  Service: Gastroenterology;  Laterality: N/A;  . INGUINAL HERNIA REPAIR Bilateral 05/21/2014   Procedure: OPEN BILATERAL INGUINAL HERNIA REPAIRS WITH MESH;  Surgeon: Donnie Mesa, MD;  Location: [place];  Service: General;  Laterality: Bilateral;  . POLYPECTOMY  03/25/2019   Procedure: POLYPECTOMY;  Surgeon: Yetta Flock, MD;  Location: [facility];  Service: Gastroenterology;;  . SMALL INTESTINE SURGERY  1968, 2001    related to Chrohn's disease  . TONSILLECTOMY      Family History  Problem Relation Age of Onset  . Deep vein thrombosis Mother   . Heart disease Father   . Stomach cancer Neg Hx   . Colon cancer Neg Hx   . Esophageal cancer Neg Hx   . Pancreatic cancer Neg Hx   . Rectal cancer Neg Hx   . Colon polyps Neg Hx     Social History   Tobacco Use  . Smoking status: Former Smoker    Quit date: 04/01/1976     Years since quitting: 43.4  . Smokeless tobacco: Never Used  Vaping Use  . Vaping Use: Never used  Substance Use Topics  . Alcohol use: Yes    Comment: 3-4  glass wine  weekly  . Drug use: No    Prior to Admission medications   Medication Sig Start Date End Date Taking? Authorizing Provider  acetaminophen (TYLENOL) 500 MG tablet Take 500-1,000 mg by mouth every 8 (eight) hours as needed for moderate pain or headache.   Yes [provider]  Ascorbic Acid (VITAMIN C) 1000 MG tablet Take 1,000 mg by mouth daily.   Yes [provider]  augmented betamethasone dipropionate (DIPROLENE-AF) 0.05 % cream Apply 1 application topically 2 (two) times daily as needed (Grover's disease).  10/03/17  Yes [provider]  brimonidine (ALPHAGAN) 0.2 % ophthalmic solution Place 1 drop into the left eye 2 (two) times daily.  11/20/15  Yes [provider]  budesonide (ENTOCORT EC) 3 MG 24 hr capsule Take 3-9 mg by mouth See admin instructions. Take 9 mg daily for 4 weeks then 6 mg daily for 2 weeks then 3 mg daily for 2 weeks then stop   Yes [provider]  Calcium Carb-Cholecalciferol (CALCIUM 600+D3) 600-800 MG-UNIT TABS Take 2 tablets by mouth daily.   Yes [provider]  Cholecalciferol (VITAMIN D) 2000 units  CAPS Take 1 capsule (2,000 Units total) by mouth daily. 08/22/16  Yes Nishaan Stanke, Carlota Raspberry, MD  cyanocobalamin (,VITAMIN B-12,) 1000 MCG/ML injection INJECT 1 ML INTO THE MUSCLE EVERY 30 DAYS Patient taking differently: Inject 1,000 mcg into the skin every 30 (thirty) days.  07/12/19  Yes Burns, Claudina Lick, MD  dorzolamide-timolol (COSOPT) 22.3-6.8 MG/ML ophthalmic solution Place 1 drop into the left eye 2 (two) times daily.  11/05/15  Yes [provider]  ferrous sulfate 325 (65 FE) MG tablet Take 325 mg by mouth 3 (three) times a week.    Yes [provider]  latanoprost (XALATAN) 0.005 % ophthalmic solution Place 1 drop into the left  eye at bedtime.  09/12/17  Yes [provider]  loperamide (IMODIUM A-D) 2 MG tablet Take 2 mg by mouth daily.    Yes [provider]  Netarsudil Dimesylate (RHOPRESSA) 0.02 % SOLN Place 1 drop into the left eye at bedtime.    Yes [provider]  omeprazole (PRILOSEC) 20 MG capsule Take 20 mg by mouth daily.    Yes [provider]  prednisoLONE acetate (PRED FORTE) 1 % ophthalmic suspension Place 1 drop into the right eye 4 (four) times daily.  08/28/18  Yes [provider]  Sodium Sulfate-Mag Sulfate-KCl (SUTAB) [phone] MG TABS Take 1 kit by mouth as directed. 08/02/19  Yes Harpreet Signore, Carlota Raspberry, MD  triamcinolone (NASACORT ALLERGY 24HR) 55 MCG/ACT AERO nasal inhaler Place 2 sprays into the nose daily as needed (allergies).   Yes [provider]    Current Facility-Administered Medications  Medication Dose Route Frequency Provider Last Rate Last Admin  . 0.9 %  sodium chloride infusion   Intravenous Continuous Elfreida Heggs, Carlota Raspberry, MD      . lactated ringers infusion   Intravenous Continuous Havery Moros Carlota Raspberry, MD 10 mL/hr at 08/26/19 0905 1,000 mL at 08/26/19 0905    Allergies as of 06/05/2019  . (No Known Allergies)     Review of Systems:    As per HPI, otherwise negative    Physical Exam:  Vital signs in last 24 hours: Temp:  [97.9 F (36.6 C)] 97.9 F (36.6 C) (08/16 0851) Pulse Rate:  [67] 67 (08/16 0851) Resp:  [20] 20 (08/16 0851) BP: (191)/(82) 191/82 (08/16 0851) SpO2:  [98 %] 98 % (08/16 0851) Weight:  [83 kg] 83 kg (08/16 0851)   General:   Pleasant male in NAD Lungs:  Respirations even and unlabored. Lungs clear to auscultation bilaterally.   Heart:  Regular rate and rhythm; no MRG Abdomen:  Soft, nondistended, nontender.  Neurologic:  Alert and  oriented x4;  grossly normal neurologically. Psych:  Alert and cooperative. Normal affect. Lab Results  Component Value Date   WBC 11.7 (H) 03/10/2019   HGB 13.4  03/10/2019   HCT 39.2 03/10/2019   MCV 94.0 03/10/2019   PLT 239 03/10/2019    Lab Results  Component Value Date   CREATININE 0.84 03/10/2019   BUN 14 03/10/2019   NA 138 03/10/2019   K 3.2 (L) 03/10/2019   CL 101 03/10/2019   CO2 28 03/10/2019     Lab Results  Component Value Date   ALT 17 03/10/2019   AST 20 03/10/2019   ALKPHOS 47 03/10/2019   BILITOT 1.0 03/10/2019      Impression / Plan:   86 y/o male with Crohn's disease of the small bowel, numerous colon polyps, some large, here for surveillance colonoscopy as outlined above. I have  discussed risks / benefits and he wishes to proceed, further recommendations pending the results.  Tarlton Cellar, MD [facility]

## 2019-08-26 NOTE — Anesthesia Postprocedure Evaluation (Signed)
Anesthesia Post Note  Patient: Michael [name]  Procedure(s) Performed: COLONOSCOPY WITH PROPOFOL (N/A ) ENDOSCOPIC MUCOSAL RESECTION (N/A ) POLYPECTOMY Sprayed methylene blue     Patient location during evaluation: PACU Anesthesia Type: MAC Level of consciousness: awake and alert Pain management: pain level controlled Vital Signs Assessment: post-procedure vital signs reviewed and stable Respiratory status: spontaneous breathing, nonlabored ventilation and respiratory function stable Cardiovascular status: blood pressure returned to baseline and stable Postop Assessment: no apparent nausea or vomiting Anesthetic complications: no   No complications documented.  Last Vitals:  Vitals:   08/26/19 0851 08/26/19 1103  BP: (!) 191/82 (!) 179/80  Pulse: 67 71  Resp: 20 16  Temp: 36.6 C   SpO2: 98% 100%    Last Pain:  Vitals:   08/26/19 1103  TempSrc:   PainSc: 0-No pain                 Pervis Hocking

## 2019-08-26 NOTE — Discharge Instructions (Signed)
YOU HAD AN ENDOSCOPIC PROCEDURE TODAY AT THE [facility]:   Refer to the procedure report that was given to you for any specific questions about what was found during the examination.  If the procedure report does not answer your questions, please call your gastroenterologist to clarify.  If you requested that your care partner not be given the details of your procedure findings, then the procedure report has been included in a sealed envelope for you to review at your convenience later.  YOU SHOULD EXPECT: Some feelings of bloating in the abdomen. Passage of more gas than usual.  Walking can help get rid of the air that was put into your GI tract during the procedure and reduce the bloating. If you had a lower endoscopy (such as a colonoscopy or flexible sigmoidoscopy) you may notice spotting of blood in your stool or on the toilet paper. If you underwent a bowel prep for your procedure, you may not have a normal bowel movement for a few days.  Please Note:  You might notice some irritation and congestion in your nose or some drainage.  This is from the oxygen used during your procedure.  There is no need for concern and it should clear up in a day or so.  SYMPTOMS TO REPORT IMMEDIATELY:   Following lower endoscopy (colonoscopy or flexible sigmoidoscopy):  Excessive amounts of blood in the stool  Significant tenderness or worsening of abdominal pains  Swelling of the abdomen that is new, acute  Fever of 100F or higher  For urgent or emergent issues, a gastroenterologist can be reached at any hour by calling [phone]. Do not use MyChart messaging for urgent concerns.    DIET:  We do recommend a small meal at first, but then you may proceed to your regular diet.  Drink plenty of fluids but you should avoid alcoholic beverages for 24 hours.  ACTIVITY:  You should plan to take it easy for the rest of today and you should NOT DRIVE or use heavy machinery until tomorrow (because  of the sedation medicines used during the test).    FOLLOW UP: Our staff will call the number listed on your records 48-72 hours following your procedure to check on you and address any questions or concerns that you may have regarding the information given to you following your procedure. If we do not reach you, we will leave a message.  We will attempt to reach you two times.  During this call, we will ask if you have developed any symptoms of COVID 19. If you develop any symptoms (ie: fever, flu-like symptoms, shortness of breath, cough etc.) before then, please call [phone].  If you test positive for Covid 19 in the 2 weeks post procedure, please call and report this information to us.    If any biopsies were taken you will be contacted by phone or by letter within the next 1-3 weeks.  Please call us at [phone] if you have not heard about the biopsies in 3 weeks.    SIGNATURES/CONFIDENTIALITY: You and/or your care partner have signed paperwork which will be entered into your electronic medical record.  These signatures attest to the fact that that the information above on your After Visit Summary has been reviewed and is understood.  Full responsibility of the confidentiality of this discharge information lies with you and/or your care-partner. 

## 2019-08-26 NOTE — Interval H&P Note (Signed)
History and Physical Interval Note:  08/26/2019 9:43 AM  Michael [name]  has presented today for surgery, with the diagnosis of ileal Crohn's, Hx of colon polyps.  The various methods of treatment have been discussed with the patient and family. After consideration of risks, benefits and other options for treatment, the patient has consented to  Procedure(s): COLONOSCOPY WITH PROPOFOL (N/A) ENDOSCOPIC MUCOSAL RESECTION (N/A) as a surgical intervention.  The patient's history has been reviewed, patient examined, no change in status, stable for surgery.  I have reviewed the patient's chart and labs.  Questions were answered to the patient's satisfaction.     [employer]

## 2019-08-26 NOTE — Transfer of Care (Signed)
Immediate Anesthesia Transfer of Care Note  Patient: Michael [name]  Procedure(s) Performed: COLONOSCOPY WITH PROPOFOL (N/A ) ENDOSCOPIC MUCOSAL RESECTION (N/A ) POLYPECTOMY  Patient Location: PACU and Endoscopy Unit  Anesthesia Type:MAC  Level of Consciousness: awake, alert  and oriented  Airway & Oxygen Therapy: Patient Spontanous Breathing and Patient connected to face mask oxygen  Post-op Assessment: Report given to RN and Post -op Vital signs reviewed and stable  Post vital signs: Reviewed and stable  Last Vitals:  Vitals Value Taken Time  BP    Temp    Pulse 75 08/26/19 1103  Resp 15 08/26/19 1103  SpO2 100 % 08/26/19 1103  Vitals shown include unvalidated device data.  Last Pain:  Vitals:   08/26/19 0851  TempSrc: Oral  PainSc: 0-No pain         Complications: No complications documented.

## 2019-08-26 NOTE — Op Note (Signed)
[facility] Patient Name: Michael [name] Procedure Date: 08/26/2019 MRN: [id number] Attending MD: Carlota Raspberry. Mata Rowen , MD Date of Birth: 1932/11/06 CSN: [id number] Age: 86 Admit Type: Outpatient Procedure:                Colonoscopy Indications:              High risk colon cancer surveillance: Personal                            history of colonic polyps, Crohn's disease of the                            ileum s/p resection (currently untreated), numerous                            flat colon polyps of the right and transverse colon                            (>30) in the past 6 months, one at surgical                            anastomosis. Discussed at [place],                            patient has elected to decline surgery and wishes                            to have surveillance colonoscopy. History of poor                            preps, double prep for this exam, history of                            colonic spasm, prior polyp in transverse colon not                            removed due to length of case on last exam Providers:                Carlota Raspberry. Havery Moros, MD, Burtis Junes, RN, Laverda Sorenson, Technician, Caryl Pina CRNA Referring MD:              Medicines:                Monitored Anesthesia Care Complications:            No immediate complications. Estimated blood loss:                            Minimal. Estimated Blood Loss:     Estimated blood loss was minimal. Procedure:                Pre-Anesthesia Assessment:                           -  Prior to the procedure, a History and Physical                            was performed, and patient medications and                            allergies were reviewed. The patient's tolerance of                            previous anesthesia was also reviewed. The risks                            and benefits of the procedure and the sedation                             options and risks were discussed with the patient.                            All questions were answered, and informed consent                            was obtained. Prior Anticoagulants: The patient has                            taken no previous anticoagulant or antiplatelet                            agents. ASA Grade Assessment: III - A patient with                            severe systemic disease. After reviewing the risks                            and benefits, the patient was deemed in                            satisfactory condition to undergo the procedure.                           After obtaining informed consent, the colonoscope                            was passed under direct vision. Throughout the                            procedure, the patient's blood pressure, pulse, and                            oxygen saturations were monitored continuously. The                            [id number]-H190DL ([id number]) Olympus pediatric colonscope  was introduced through the anus and advanced to the                            the terminal ileum. The colonoscopy was technically                            difficult and complex due to spastic colon. The                            patient tolerated the procedure well. The quality                            of the bowel preparation was adequate. The terminal                            ileum and the rectum were photographed. Scope In: 9:52:43 AM Scope Out: 10:55:09 AM Scope Withdrawal Time: 0 hours 59 minutes 38 seconds  Total Procedure Duration: 1 hour 2 minutes 26 seconds  Findings:      Patchy inflammation, graded as Rutgeerts Score i2 was found in the       terminal ileum and at the anastomosis. Stable in comparison to last exam.      There was evidence of a prior end-to-side ileo-colonic anastomosis in       the ascending colon. This was characterized by mild stenosis but widely       patent.      Prior  polypectomy sited noted in the ascending colon near the surgical       anastomosis. Best seen on retroflexed views. Really hard to say if there       is recurrent / persisent polyp there vs. benign ileal tissue given it's       location at the anastomosis. A large (10-53m) area was removed with a       piecemeal technique using a cold snare to ensure no residual polyp       tissue. Resection and retrieval were complete.      The colon was quite spastic as previously noted and right and transverse       colon difficult to evaluate in this setting. Glucagon was given which       was helpful. A solution of methylene blue and saline was successfully       injected (several cc's saline) with for chromoendoscopy.      Two flat polyps were found in the transverse colon. The polyps were 5 to       20 mm in size. These polyps were removed with a piecemeal technique       using a cold snare. Resection and retrieval were complete.      Multiple medium-mouthed diverticula were found in the transverse colon       and left colon.      The exam was otherwise without abnormality. Prep initially fair, several       minutes spent to lavage the colon. Not much time spent on the left colon       given duration of this procedure. Impression:               - Crohn's disease with mildly active ileitis.                           -  Patent end-to-side ileo-colonic anastomosis.                           - Prior polypectomy site in the ascending colon,                            unclear if residual polyp vs. normal variant ileal                            tissue given location at the anatomosis. Area                            removed piecemeal using a cold snare. Resected and                            retrieved.                           - Chromoendoscopy applied to the proximal                            transverse and right colon.                           - Two 5 to 20 mm polyps in the transverse colon,                             removed piecemeal using a cold snare. Resected and                            retrieved.                           - Diverticulosis in the transverse colon and in the                            left colon.                           - The examination was otherwise normal.                           - Spatic colon treated with glucagon.                           Overall, very challenging exam, time needed to                            clear the colon, visualization difficult given                            spasm. I think the colon at this point has been                            cleared of all high risk lesions however difficult  to say if there is residual / recurrence at the                            polyp site near the anastomosis. Under normal                            circumstances surgery would be recommended to                            remove right colon and anastomosis however given                            patient's age he has elected for colonoscopy                            surveillance. Moderate Sedation:      No moderate sedation, case performed with MAC Recommendation:           - Patient has a contact number available for                            emergencies. The signs and symptoms of potential                            delayed complications were discussed with the                            patient. Return to normal activities tomorrow.                            Written discharge instructions were provided to the                            patient.                           - Resume previous diet.                           - Continue present medications.                           - Await pathology results. Follow up in the clinic                            in 1-2 months Procedure Code(s):        --- Professional ---                           [phone], Colonoscopy, flexible; with removal of                             tumor(s), polyp(s), or other lesion(s) by snare                            technique  Diagnosis Code(s):        --- Professional ---                           K63.5, Polyp of colon                           Z86.010, Personal history of colonic polyps                           K50.00, Crohn's disease of small intestine without                            complications                           Z98.0, Intestinal bypass and anastomosis status                           K57.30, Diverticulosis of large intestine without                            perforation or abscess without bleeding CPT copyright 2019 American Medical Association. All rights reserved. The codes documented in this report are preliminary and upon coder review may  be revised to meet current compliance requirements. Remo Lipps P. Marnee Sherrard, MD 08/26/2019 11:13:01 AM This report has been signed electronically. Number of Addenda: 0

## 2019-08-27 LAB — SURGICAL PATHOLOGY

## 2019-08-28 ENCOUNTER — Encounter (HOSPITAL_COMMUNITY): Payer: Self-pay | Admitting: Gastroenterology

## 2019-08-30 ENCOUNTER — Ambulatory Visit: Payer: Medicare Other | Admitting: Neurology

## 2019-09-03 ENCOUNTER — Telehealth: Payer: Self-pay | Admitting: Gastroenterology

## 2019-09-03 NOTE — Telephone Encounter (Signed)
Patient's daughter notified that pathology has not been reviewed/ commented on by Dr. Havery Moros and that they will be contacted by phone or letter when he has

## 2019-09-04 ENCOUNTER — Other Ambulatory Visit: Payer: Self-pay | Admitting: Gastroenterology

## 2019-09-04 DIAGNOSIS — K50019 Crohn's disease of small intestine with unspecified complications: Secondary | ICD-10-CM

## 2019-09-04 MED ORDER — BUDESONIDE 3 MG PO CPEP: 3.0000 mg | ORAL_CAPSULE | Freq: Every day | ORAL | 0 refills | Status: DC

## 2019-09-11 ENCOUNTER — Emergency Department (HOSPITAL_BASED_OUTPATIENT_CLINIC_OR_DEPARTMENT_OTHER): Payer: Medicare Other

## 2019-09-11 ENCOUNTER — Other Ambulatory Visit: Payer: Self-pay

## 2019-09-11 ENCOUNTER — Emergency Department (HOSPITAL_COMMUNITY): Payer: Medicare Other

## 2019-09-11 ENCOUNTER — Observation Stay (HOSPITAL_COMMUNITY)
Admission: EM | Admit: 2019-09-11 | Discharge: 2019-09-12 | Disposition: A | Discharge status: Home / Self Care | Payer: Medicare Other | Attending: Internal Medicine | Admitting: Internal Medicine

## 2019-09-11 ENCOUNTER — Observation Stay (HOSPITAL_COMMUNITY): Payer: Medicare Other

## 2019-09-11 ENCOUNTER — Encounter (HOSPITAL_COMMUNITY): Payer: Self-pay

## 2019-09-11 DIAGNOSIS — R0902 Hypoxemia: Secondary | ICD-10-CM | POA: Diagnosis not present

## 2019-09-11 DIAGNOSIS — Z87891 Personal history of nicotine dependence: Secondary | ICD-10-CM | POA: Insufficient documentation

## 2019-09-11 DIAGNOSIS — R6889 Other general symptoms and signs: Secondary | ICD-10-CM | POA: Diagnosis not present

## 2019-09-11 DIAGNOSIS — R479 Unspecified speech disturbances: Secondary | ICD-10-CM | POA: Diagnosis present

## 2019-09-11 DIAGNOSIS — R29818 Other symptoms and signs involving the nervous system: Secondary | ICD-10-CM | POA: Diagnosis not present

## 2019-09-11 DIAGNOSIS — Z8673 Personal history of transient ischemic attack (TIA), and cerebral infarction without residual deficits: Secondary | ICD-10-CM

## 2019-09-11 DIAGNOSIS — R531 Weakness: Secondary | ICD-10-CM | POA: Diagnosis not present

## 2019-09-11 DIAGNOSIS — Z743 Need for continuous supervision: Secondary | ICD-10-CM | POA: Diagnosis not present

## 2019-09-11 DIAGNOSIS — R29810 Facial weakness: Secondary | ICD-10-CM | POA: Insufficient documentation

## 2019-09-11 DIAGNOSIS — G459 Transient cerebral ischemic attack, unspecified: Principal | ICD-10-CM | POA: Insufficient documentation

## 2019-09-11 DIAGNOSIS — Z20822 Contact with and (suspected) exposure to covid-19: Secondary | ICD-10-CM | POA: Diagnosis not present

## 2019-09-11 DIAGNOSIS — Z86010 Personal history of colonic polyps: Secondary | ICD-10-CM | POA: Diagnosis not present

## 2019-09-11 DIAGNOSIS — R9431 Abnormal electrocardiogram [ECG] [EKG]: Secondary | ICD-10-CM | POA: Diagnosis not present

## 2019-09-11 LAB — COMPREHENSIVE METABOLIC PANEL
ALT: 10 U/L (ref 0–44)
AST: 13 U/L — ABNORMAL LOW (ref 15–41)
Albumin: 3.6 g/dL (ref 3.5–5.0)
Alkaline Phosphatase: 45 U/L (ref 38–126)
Anion gap: 9 (ref 5–15)
BUN: 13 mg/dL (ref 8–23)
CO2: 27 mmol/L (ref 22–32)
Calcium: 9 mg/dL (ref 8.9–10.3)
Chloride: 102 mmol/L (ref 98–111)
Creatinine, Ser: 1.06 mg/dL (ref 0.61–1.24)
GFR calc Af Amer: >60 mL/min (ref >60)
GFR calc non Af Amer: >60 mL/min (ref >60)
Glucose, Bld: 100 mg/dL — ABNORMAL HIGH (ref 70–99)
Potassium: 4.1 mmol/L (ref 3.5–5.1)
Sodium: 138 mmol/L (ref 135–145)
Total Bilirubin: 1.2 mg/dL (ref 0.3–1.2)
Total Protein: 6.5 g/dL (ref 6.5–8.1)

## 2019-09-11 LAB — DIFFERENTIAL
Abs Immature Granulocytes: 0.05 10*3/uL (ref 0.00–0.07)
Basophils Absolute: 0 10*3/uL (ref 0.0–0.1)
Basophils Relative: 0 %
Eosinophils Absolute: 0.1 10*3/uL (ref 0.0–0.5)
Eosinophils Relative: 2 %
Immature Granulocytes: 1 %
Lymphocytes Relative: 28 %
Lymphs Abs: 2.1 10*3/uL (ref 0.7–4.0)
Monocytes Absolute: 0.6 10*3/uL (ref 0.1–1.0)
Monocytes Relative: 8 %
Neutro Abs: 4.6 10*3/uL (ref 1.7–7.7)
Neutrophils Relative %: 61 %

## 2019-09-11 LAB — RAPID URINE DRUG SCREEN, HOSP PERFORMED
Amphetamines: NOT DETECTED
Barbiturates: NOT DETECTED
Benzodiazepines: NOT DETECTED
Cocaine: NOT DETECTED
Opiates: NOT DETECTED
Tetrahydrocannabinol: NOT DETECTED

## 2019-09-11 LAB — I-STAT CHEM 8, ED
BUN: 14 mg/dL (ref 8–23)
Calcium, Ion: 1.11 mmol/L — ABNORMAL LOW (ref 1.15–1.40)
Chloride: 101 mmol/L (ref 98–111)
Creatinine, Ser: 1 mg/dL (ref 0.61–1.24)
Glucose, Bld: 96 mg/dL (ref 70–99)
HCT: 34 % — ABNORMAL LOW (ref 39.0–52.0)
Hemoglobin: 11.6 g/dL — ABNORMAL LOW (ref 13.0–17.0)
Potassium: 4 mmol/L (ref 3.5–5.1)
Sodium: 139 mmol/L (ref 135–145)
TCO2: 26 mmol/L (ref 22–32)

## 2019-09-11 LAB — PROTIME-INR
INR: 1 (ref 0.8–1.2)
Prothrombin Time: 12.6 seconds (ref 11.4–15.2)

## 2019-09-11 LAB — URINALYSIS, ROUTINE W REFLEX MICROSCOPIC
Bilirubin Urine: NEGATIVE
Glucose, UA: NEGATIVE mg/dL
Hgb urine dipstick: NEGATIVE
Ketones, ur: NEGATIVE mg/dL
Leukocytes,Ua: NEGATIVE
Nitrite: NEGATIVE
Protein, ur: NEGATIVE mg/dL
Specific Gravity, Urine: 1.004 — ABNORMAL LOW (ref 1.005–1.030)
pH: 7 (ref 5.0–8.0)

## 2019-09-11 LAB — CBC
HCT: 36.5 % — ABNORMAL LOW (ref 39.0–52.0)
Hemoglobin: 11.9 g/dL — ABNORMAL LOW (ref 13.0–17.0)
MCH: 31.1 pg (ref 26.0–34.0)
MCHC: 32.6 g/dL (ref 30.0–36.0)
MCV: 95.3 fL (ref 80.0–100.0)
Platelets: 167 10*3/uL (ref 150–400)
RBC: 3.83 MIL/uL — ABNORMAL LOW (ref 4.22–5.81)
RDW: 13.5 % (ref 11.5–15.5)
WBC: 7.5 10*3/uL (ref 4.0–10.5)
nRBC: 0 % (ref 0.0–0.2)

## 2019-09-11 LAB — HEMOGLOBIN A1C
Hgb A1c MFr Bld: 5.4 % (ref 4.8–5.6)
Mean Plasma Glucose: 108.28 mg/dL

## 2019-09-11 LAB — SARS CORONAVIRUS 2 BY RT PCR (HOSPITAL ORDER, PERFORMED IN ~~LOC~~ HOSPITAL LAB): SARS Coronavirus 2: NEGATIVE

## 2019-09-11 LAB — LDL CHOLESTEROL, DIRECT: Direct LDL: 60.6 mg/dL (ref 0–99)

## 2019-09-11 LAB — APTT: aPTT: 33 seconds (ref 24–36)

## 2019-09-11 LAB — ETHANOL: Alcohol, Ethyl (B): <10 mg/dL (ref <10)

## 2019-09-11 MED ORDER — SODIUM CHLORIDE 0.9 % IV SOLN: INTRAVENOUS | Status: DC

## 2019-09-11 MED ORDER — ASCORBIC ACID 500 MG PO TABS
1000.0000 mg | ORAL_TABLET | Freq: Every day | ORAL | Status: DC
  Administered 2019-09-12: 1000 mg via ORAL
  Filled 2019-09-11: qty 2

## 2019-09-11 MED ORDER — LATANOPROST 0.005 % OP SOLN
1.0000 [drp] | OPHTHALMIC | Status: DC
  Filled 2019-09-11: qty 2.5

## 2019-09-11 MED ORDER — STROKE: EARLY STAGES OF RECOVERY BOOK
Freq: Once | Status: DC
  Filled 2019-09-11: qty 1

## 2019-09-11 MED ORDER — ASPIRIN 325 MG PO TABS
325.0000 mg | ORAL_TABLET | Freq: Once | ORAL | Status: AC
  Administered 2019-09-11: 325 mg via ORAL
  Filled 2019-09-11: qty 1

## 2019-09-11 MED ORDER — ENOXAPARIN SODIUM 40 MG/0.4ML ~~LOC~~ SOLN
40.0000 mg | SUBCUTANEOUS | Status: DC
  Administered 2019-09-11: 40 mg via SUBCUTANEOUS
  Filled 2019-09-11: qty 0.4

## 2019-09-11 MED ORDER — LOPERAMIDE HCL 2 MG PO CAPS: 2.0000 mg | ORAL_CAPSULE | Freq: Every day | ORAL | Status: DC | PRN

## 2019-09-11 MED ORDER — LATANOPROST 0.005 % OP SOLN: 1.0000 [drp] | Freq: Every day | OPHTHALMIC | Status: DC

## 2019-09-11 MED ORDER — SODIUM SULFATE-MAG SULFATE-KCL 1479-225-188 MG PO TABS: 1.0000 | ORAL_TABLET | ORAL | Status: DC

## 2019-09-11 MED ORDER — BUDESONIDE 3 MG PO CPEP: 3.0000 mg | ORAL_CAPSULE | ORAL | Status: DC

## 2019-09-11 MED ORDER — DORZOLAMIDE HCL-TIMOLOL MAL 2-0.5 % OP SOLN: 1.0000 [drp] | Freq: Two times a day (BID) | OPHTHALMIC | Status: DC

## 2019-09-11 MED ORDER — NETARSUDIL DIMESYLATE 0.02 % OP SOLN: 1.0000 [drp] | Freq: Every day | OPHTHALMIC | Status: DC

## 2019-09-11 MED ORDER — TRIAMCINOLONE ACETONIDE 55 MCG/ACT NA AERO: 2.0000 | INHALATION_SPRAY | Freq: Every day | NASAL | Status: DC | PRN

## 2019-09-11 MED ORDER — HYDRALAZINE HCL 25 MG PO TABS: 25.0000 mg | ORAL_TABLET | Freq: Four times a day (QID) | ORAL | Status: DC | PRN

## 2019-09-11 MED ORDER — PREDNISOLONE ACETATE 1 % OP SUSP
1.0000 [drp] | OPHTHALMIC | Status: DC
  Filled 2019-09-11 (×2): qty 5

## 2019-09-11 MED ORDER — CALCIUM CARB-CHOLECALCIFEROL 600-800 MG-UNIT PO TABS: 2.0000 | ORAL_TABLET | Freq: Every day | ORAL | Status: DC

## 2019-09-11 MED ORDER — ATORVASTATIN CALCIUM 40 MG PO TABS
40.0000 mg | ORAL_TABLET | Freq: Every day | ORAL | Status: DC
  Administered 2019-09-12: 40 mg via ORAL
  Filled 2019-09-11: qty 1

## 2019-09-11 MED ORDER — GADOBUTROL 1 MMOL/ML IV SOLN
8.0000 mL | Freq: Once | INTRAVENOUS | Status: AC | PRN
  Administered 2019-09-11: 8 mL via INTRAVENOUS

## 2019-09-11 MED ORDER — DORZOLAMIDE HCL-TIMOLOL MAL 2-0.5 % OP SOLN
1.0000 [drp] | OPHTHALMIC | Status: DC
  Filled 2019-09-11: qty 10

## 2019-09-11 MED ORDER — PANTOPRAZOLE SODIUM 40 MG PO TBEC
40.0000 mg | DELAYED_RELEASE_TABLET | Freq: Every day | ORAL | Status: DC
  Administered 2019-09-12: 40 mg via ORAL
  Filled 2019-09-11: qty 1

## 2019-09-11 MED ORDER — ACETAMINOPHEN 500 MG PO TABS: 500.0000 mg | ORAL_TABLET | Freq: Three times a day (TID) | ORAL | Status: DC | PRN

## 2019-09-11 MED ORDER — BRIMONIDINE TARTRATE 0.2 % OP SOLN
1.0000 [drp] | OPHTHALMIC | Status: DC
  Filled 2019-09-11: qty 5

## 2019-09-11 MED ORDER — VITAMIN D 25 MCG (1000 UNIT) PO TABS
2000.0000 [IU] | ORAL_TABLET | Freq: Every day | ORAL | Status: DC
  Administered 2019-09-12: 2000 [IU] via ORAL
  Filled 2019-09-11: qty 2

## 2019-09-11 MED ORDER — PREDNISOLONE ACETATE 1 % OP SUSP: 1.0000 [drp] | Freq: Four times a day (QID) | OPHTHALMIC | Status: DC

## 2019-09-11 MED ORDER — ASPIRIN 81 MG PO CHEW
81.0000 mg | CHEWABLE_TABLET | Freq: Every day | ORAL | Status: DC
  Administered 2019-09-12: 81 mg via ORAL
  Filled 2019-09-11: qty 1

## 2019-09-11 MED ORDER — LOPERAMIDE HCL 2 MG PO TABS: 2.0000 mg | ORAL_TABLET | Freq: Every day | ORAL | Status: DC

## 2019-09-11 MED ORDER — BRIMONIDINE TARTRATE 0.2 % OP SOLN: 1.0000 [drp] | Freq: Two times a day (BID) | OPHTHALMIC | Status: DC

## 2019-09-11 MED ORDER — FERROUS SULFATE 325 (65 FE) MG PO TABS: 325.0000 mg | ORAL_TABLET | ORAL | Status: DC

## 2019-09-11 MED ORDER — TRIAMCINOLONE ACETONIDE 0.5 % EX CREA
TOPICAL_CREAM | Freq: Two times a day (BID) | CUTANEOUS | Status: DC
  Filled 2019-09-11: qty 15

## 2019-09-11 MED ORDER — SENNOSIDES-DOCUSATE SODIUM 8.6-50 MG PO TABS: 1.0000 | ORAL_TABLET | Freq: Every evening | ORAL | Status: DC | PRN

## 2019-09-11 NOTE — ED Notes (Signed)
Pt was approached by [facility] neurology Suzzanne Cloud, MD about clinical trial. Pt declines clinical trial participation at this time

## 2019-09-11 NOTE — ED Notes (Signed)
Patient transported to MRI 

## 2019-09-11 NOTE — ED Triage Notes (Signed)
Pt arrived to ED via [facility] from home w/ c/o stroke symptoms. LKN was 1115. Pt's wife noticed onset of s/s at 1200. Initial s/s: dysphagia, word salad, garbled speech, balance problems, and R facial droop.

## 2019-09-11 NOTE — ED Provider Notes (Signed)
[facility] EMERGENCY DEPARTMENT Provider Note   CSN: [id number] Arrival date & time: 09/11/19  1312  An emergency department physician performed an initial assessment on this suspected stroke patient at 1313.  History Chief Complaint  Patient presents with  . Stroke Symptoms    Michael [name] is a 86 y.o. male.  HPI 86 year old male presents with acute stroke symptoms.  Last seen normal around 1115.  Around noon wife noticed that patient had right facial droop and abnormal speech.  Sounded garbled.  This is rapidly improved since EMS has picked him up.  No extremity weakness.  Patient denies any headache or other acute illness.   Past Medical History:  Diagnosis Date  . Adrenal insufficiency ([place])   . Anemia   . Avascular necrosis ([place])   . Crohn's disease ([place])   . Glaucoma   . Inguinal hernia recurrent bilateral   . Low testosterone   . Osteopenia   . Retinal ischemia     Patient Active Problem List   Diagnosis Date Noted  . TIA (transient ischemic attack) 09/11/2019  . History of colonic polyps   . Benign neoplasm of colon   . Ingrown toenail of right foot with infection 06/06/2019  . Earache symptoms, bilateral 06/03/2019  . Bilateral impacted cerumen 05/25/2019  . Overactive bladder 05/06/2019  . Memory changes 05/06/2019  . Polyp of large intestine   . Asymptomatic varicose veins of left lower extremity 12/16/2017  . Hyperglycemia 11/02/2017  . 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 Clavicle fracture 11/02/2017  . Vitamin D deficiency 11/02/2017  . History of DVT of lower extremity, left leg 07/2017 08/08/2017  . SBO (small bowel obstruction) ([place]) 07/05/2017  . Bilateral hearing loss 05/31/2017  . B12 deficiency 02/06/2017  . Osteopenia 02/06/2017  . Glaucoma 12/12/2015  . Crohn's disease ([place]) 12/12/2015  . Anemia 12/12/2015  . 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 ABDOMINAL SURGERY    .  COLONOSCOPY N/A 12/13/2015   Procedure: COLONOSCOPY;  Surgeon: Laurence Spates, MD;  Location: [facility];  Service: Endoscopy;  Laterality: N/A;  . COLONOSCOPY WITH PROPOFOL N/A 03/25/2019   Procedure: COLONOSCOPY WITH PROPOFOL;  Surgeon: Yetta Flock, MD;  Location: [facility];  Service: Gastroenterology;  Laterality: N/A;  . COLONOSCOPY WITH PROPOFOL N/A 08/26/2019   Procedure: COLONOSCOPY WITH PROPOFOL;  Surgeon: Yetta Flock, MD;  [facility];  Service: Gastroenterology;  Laterality: N/A;  . ENDOSCOPIC MUCOSAL RESECTION N/A 08/26/2019   Procedure: ENDOSCOPIC MUCOSAL RESECTION;  Surgeon: Yetta Flock, MD;  [facility];  Service: Gastroenterology;  Laterality: N/A;  . INGUINAL HERNIA REPAIR Bilateral 05/21/2014   Procedure: OPEN BILATERAL INGUINAL HERNIA REPAIRS WITH MESH;  Surgeon: Donnie [place], MD;  Location: [facility];  Service: General;  Laterality: Bilateral;  . POLYPECTOMY  03/25/2019   Procedure: POLYPECTOMY;  Surgeon: Yetta Flock, MD;  Location: [facility];  Service: Gastroenterology;;  . POLYPECTOMY  08/26/2019   Procedure: POLYPECTOMY;  Surgeon: Yetta Flock, MD;  [facility];  Service: Gastroenterology;;  . Woodward Ku  08/26/2019   Procedure: Sprayed methylene blue;  Surgeon: Yetta Flock, MD;  [facility];  Service: Gastroenterology;;  . SMALL INTESTINE SURGERY  1968, 2001    related to Chrohn's disease  . TONSILLECTOMY         Family History  Problem Relation Age of Onset  . Deep vein thrombosis Mother   . Heart disease  Father   . Stomach cancer Neg Hx   . Colon cancer Neg Hx   . Esophageal cancer Neg Hx   . Pancreatic cancer Neg Hx   . Rectal cancer Neg Hx   . Colon polyps Neg Hx     Social History   Tobacco Use  . Smoking status: Former Smoker    Quit date: 04/01/1976    Years since quitting: 43.4  . Smokeless tobacco: Never Used  Vaping Use  .  Vaping Use: Never used  Substance Use Topics  . Alcohol use: Yes    Comment: 3-4  glass wine  weekly  . Drug use: No    Home Medications Prior to Admission medications   Medication Sig Start Date End Date Taking? Authorizing Provider  acetaminophen (TYLENOL) 500 MG tablet Take 500-1,000 mg by mouth every 8 (eight) hours as needed for moderate pain or headache.    [provider]  Ascorbic Acid (VITAMIN C) 1000 MG tablet Take 1,000 mg by mouth daily.    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  10/03/17   [provider]  brimonidine (ALPHAGAN) 0.2 % ophthalmic solution Place 1 drop into the left eye 2 (two) times daily.  11/20/15   [provider]  budesonide (ENTOCORT EC) 3 MG 24 hr capsule Take 3-9 mg by mouth See admin instructions. Take 9 mg daily for 4 weeks then 6 mg daily for 2 weeks then 3 mg daily for 2 weeks then stop    [provider]  budesonide (ENTOCORT EC) 3 MG 24 hr capsule Take 1 capsule (3 mg total) by mouth daily. Take 3 tabs daily for 4 weeks, then 2 tabs / day for 2 weeks, and then 1 tab / day for 2 weeks until done 09/04/19   Yetta Flock, MD  Calcium Carb-Cholecalciferol (CALCIUM 600+D3) 600-800 MG-UNIT TABS Take 2 tablets by mouth daily.    [provider]  Cholecalciferol (VITAMIN D) 2000 units CAPS Take 1 capsule (2,000 Units total) by mouth daily. 08/22/16   Armbruster, Carlota Raspberry, MD  cyanocobalamin (,VITAMIN B-12,) 1000 MCG/ML injection INJECT 1 ML INTO THE MUSCLE EVERY 30 DAYS Patient taking differently: Inject 1,000 mcg into the skin every 30 (thirty) days.  07/12/19   Binnie Rail, MD  dorzolamide-timolol (COSOPT) 22.3-6.8 MG/ML ophthalmic solution Place 1 drop into the left eye 2 (two) times daily.  11/05/15   [provider]  ferrous sulfate 325 (65 FE) MG tablet Take 325 mg by  mouth 3 (three) times a week.     [provider]  latanoprost (XALATAN) 0.005 % ophthalmic solution Place 1 drop into the left eye at bedtime.  09/12/17   [provider]  loperamide (IMODIUM A-D) 2 MG tablet Take 2 mg by mouth daily.     [provider]  Netarsudil Dimesylate (RHOPRESSA) 0.02 % SOLN Place 1 drop into the left eye at bedtime.     [provider]  omeprazole (PRILOSEC) 20 MG capsule Take 20 mg by mouth daily.     [provider]  prednisoLONE acetate (PRED FORTE) 1 % ophthalmic suspension Place 1 drop into the right eye 4 (four) times daily.  08/28/18   [provider]  Sodium Sulfate-Mag Sulfate-KCl (SUTAB) [phone] MG TABS Take 1 kit by mouth as directed. 08/02/19   Armbruster, Carlota Raspberry, MD  triamcinolone (NASACORT ALLERGY 24HR) 55  MCG/ACT AERO nasal inhaler Place 2 sprays into the nose daily as needed (allergies).    [provider]    Allergies    Patient has no known allergies.  Review of Systems   Review of Systems  Neurological: Positive for speech difficulty. Negative for weakness and headaches.  All other systems reviewed and are negative.   Physical Exam Updated Vital Signs BP (!) 170/84   Pulse 71   Temp 97.7 F (36.5 C) (Oral)   Resp 20   Ht 5' 11"  (1.803 m)   Wt 84.1 kg   SpO2 97%   BMI 25.86 kg/m   Physical Exam Vitals and nursing note reviewed.  Constitutional:      General: He is not in acute distress.    Appearance: He is well-developed. He is not ill-appearing or diaphoretic.  HENT:     Head: Normocephalic and atraumatic.     Right Ear: External ear normal.     Left Ear: External ear normal.     Nose: Nose normal.  Eyes:     General:        Right eye: No discharge.        Left eye: No discharge.     Extraocular Movements: Extraocular movements intact.     Pupils: Pupils are equal, round, and reactive to light.  Cardiovascular:     Rate and Rhythm: Normal rate and regular  rhythm.     Heart sounds: Normal heart sounds.  Pulmonary:     Effort: Pulmonary effort is normal.     Breath sounds: Normal breath sounds.  Abdominal:     Palpations: Abdomen is soft.     Tenderness: There is no abdominal tenderness.  Musculoskeletal:     Cervical back: Neck supple.  Skin:    General: Skin is warm and dry.  Neurological:     Mental Status: He is alert and oriented to person, place, and time.     Comments: Initially, seemed to have mild right facial droop. Upon returning from CT this seems resolved. CN 3-12 grossly intact. 5/5 strength in all 4 extremities. Grossly normal sensation. Normal finger to nose.   Psychiatric:        Mood and Affect: Mood is not anxious.     ED Results / Procedures / Treatments   Labs (all labs ordered are listed, but only abnormal results are displayed) Labs Reviewed  CBC - Abnormal; Notable for the following components:      Result Value   RBC 3.83 (*)    Hemoglobin 11.9 (*)    HCT 36.5 (*)    All other components within normal limits  COMPREHENSIVE METABOLIC PANEL - Abnormal; Notable for the following components:   Glucose, Bld 100 (*)    AST 13 (*)    All other components within normal limits  URINALYSIS, ROUTINE W REFLEX MICROSCOPIC - Abnormal; Notable for the following components:   Color, Urine STRAW (*)    Specific Gravity, Urine 1.004 (*)    All other components within normal limits  I-STAT CHEM 8, ED - Abnormal; Notable for the following components:   Calcium, Ion 1.11 (*)    Hemoglobin 11.6 (*)    HCT 34.0 (*)    All other components within normal limits  SARS CORONAVIRUS 2 BY RT PCR (HOSPITAL ORDER, [facility])  ETHANOL  PROTIME-INR  APTT  DIFFERENTIAL  RAPID URINE DRUG SCREEN, HOSP PERFORMED  HEMOGLOBIN A1C  LDL CHOLESTEROL, DIRECT  EKG None  Radiology CT HEAD CODE STROKE WO CONTRAST  Result Date: 09/11/2019 CLINICAL DATA:  Code stroke. EXAM: CT HEAD WITHOUT CONTRAST  TECHNIQUE: Contiguous axial images were obtained from the base of the skull through the vertex without intravenous contrast. COMPARISON:  None. FINDINGS: Brain: No acute hemorrhage. Focal hypodensity within the left thalamus. Scattered white matter hypodensity, including evidence in the left external capsule hypodensity, which may relate to chronic microvascular ischemic disease. Mild to moderate diffuse cerebral atrophy with ex vacuo ventricular dilation. No hydrocephalus. Vascular: No definite hyperdense vessel.  Calcific atherosclerosis. Skull: Normal. Negative for fracture or focal lesion. Sinuses/Orbits: Remote right medial orbital wall fracture. Mucosal thickening involving intra great maxillary sinus. No air-fluid levels. Other: No mastoid effusion. ASPECTS ([place] Stroke Program Early CT Score): 10 IMPRESSION: 1. No acute hemorrhage. No evidence of acute large vascular territory infarct. 2. Focal hypodensity within the left thalamus is age-indeterminant without priors, but favored remote. MRI could further evaluate for acute infarct if clinically indicated. 3. Presumed chronic microvascular ischemic disease. Code stroke imaging results were communicated on 09/11/2019 at 1:40 pm to provider Dr. Annice Pih via telephone, who verbally acknowledged these results. Electronically Signed   By: Margaretha Sheffield MD   On: 09/11/2019 13:46    Procedures Procedures (including critical care time)  Medications Ordered in ED Medications  aspirin chewable tablet 81 mg (has no administration in time range)  acetaminophen (TYLENOL) tablet 500-1,000 mg (has no administration in time range)  budesonide (ENTOCORT EC) 24 hr capsule 3-9 mg (has no administration in time range)  loperamide (IMODIUM A-D) tablet 2 mg (has no administration in time range)  pantoprazole (PROTONIX) EC tablet 40 mg (has no administration in time range)  Sodium Sulfate-Mag Sulfate-KCl [phone] MG TABS 1 kit (has no administration in  time range)  ferrous sulfate tablet 325 mg (has no administration in time range)  ascorbic acid (VITAMIN C) tablet 1,000 mg (has no administration in time range)  Calcium Carb-Cholecalciferol 600-800 MG-UNIT TABS 2 tablet (has no administration in time range)  Vitamin D CAPS 2,000 Units (has no administration in time range)  triamcinolone (NASACORT) nasal inhaler 2 spray (has no administration in time range)  triamcinolone cream (KENALOG) 0.5 % (has no administration in time range)  brimonidine (ALPHAGAN) 0.2 % ophthalmic solution 1 drop (has no administration in time range)  dorzolamide-timolol (COSOPT) 22.3-6.8 MG/ML ophthalmic solution 1 drop (has no administration in time range)  latanoprost (XALATAN) 0.005 % ophthalmic solution 1 drop (has no administration in time range)  Netarsudil Dimesylate 0.02 % SOLN 1 drop (has no administration in time range)  prednisoLONE acetate (PRED FORTE) 1 % ophthalmic suspension 1 drop (has no administration in time range)   stroke: mapping our early stages of recovery book ( Does not apply Not Given 09/11/19 1442)  senna-docusate (Senokot-S) tablet 1 tablet (has no administration in time range)  enoxaparin (LOVENOX) injection 40 mg (has no administration in time range)  hydrALAZINE (APRESOLINE) tablet 25 mg (has no administration in time range)  atorvastatin (LIPITOR) tablet 40 mg (has no administration in time range)  aspirin tablet 325 mg (325 mg Oral Given 09/11/19 1411)    ED Course  I have reviewed the triage vital signs and the nursing notes.  Pertinent labs & imaging results that were available during my care of the patient were reviewed by me and considered in my medical decision making (see chart for details).    MDM Rules/Calculators/A&P  Presentation is most consistent with a TIA.  Thus no TPA.  MRI with and without contrast will be ordered based on the abnormality seen on CT that looks a little strange but could be  prior versus acute infarct.  Otherwise, will need work-up and have discussed this with him and family.  Hospitalist to admit. Final Clinical Impression(s) / ED Diagnoses Final diagnoses:  TIA (transient ischemic attack)    Rx / DC Orders ED Discharge Orders    None       Sherwood Gambler, MD 09/11/19 1527

## 2019-09-11 NOTE — ED Notes (Signed)
Suanne Marker, wife, [phone] would like an update when available

## 2019-09-11 NOTE — Consult Note (Addendum)
NEUROLOGY CONSULTATION NOTE   Date of service: September 11, 2019 Patient Name: Michael [name] MRN:  [id number] DOB:  September 30, 1932 Reason for consult: "Stroke code"  History of Present Illness  Michael [name] is a 86 y.o. male with PMH significant for colon polyps, crohn's dz who presents with acute onset L Facial droop and garbled speech with a LKW of 1115AM on 09/11/19. By the time, he got to the hospital, his only deficit was L facial droop.  He does not take aspirin at home,  Has family hx of stroke in his mom, no prior personal hx of stroke.  NIHSS: 1 for L facial droop Premorbid mRS: 0 TPA: Not offered 2/2 rapidly improving symptoms and only facial droop on presentation to the ED. Thrombectomy: Not a candidate due to minor symptoms.  Workup with [facility] was negative for a large hypodensity concerning for a large territory infarct or hyperdensity concerning for an ICH. Appears to have a remote L thalamic lacunar infarct.   ROS   Constitutional Denies weight loss, fever and chills.  HEENT Denies changes in vision and hearing.  Respiratory Denies SOB and cough.  CV Denies palpitations and CP  GI Denies abdominal pain, nausea, vomiting and diarrhea.  GU Denies dysuria and urinary frequency.  MSK Denies myalgia and joint pain.  Skin Denies rash and pruritus.  Neurological Denies headache and syncope.  Psychiatric Denies recent changes in mood. Denies anxiety and depression.   Past History   Past Medical History:  Diagnosis Date  . Adrenal insufficiency ([place])   . Anemia   . Avascular necrosis ([place])   . Crohn's disease ([place])   . Glaucoma   . Inguinal hernia recurrent bilateral   . Low testosterone   . Osteopenia   . Retinal ischemia    Past Surgical History:  Procedure Laterality Date  . ABDOMINAL SURGERY    . COLONOSCOPY N/A 12/13/2015   Procedure: COLONOSCOPY;  Surgeon: Laurence Spates, MD;  [facility];  Service: Endoscopy;  Laterality: N/A;  . COLONOSCOPY WITH  PROPOFOL N/A 03/25/2019   Procedure: COLONOSCOPY WITH PROPOFOL;  Surgeon: Yetta Flock, MD;  [facility];  Service: Gastroenterology;  Laterality: N/A;  . COLONOSCOPY WITH PROPOFOL N/A 08/26/2019   Procedure: COLONOSCOPY WITH PROPOFOL;  Surgeon: Yetta Flock, MD;  [facility];  Service: Gastroenterology;  Laterality: N/A;  . ENDOSCOPIC MUCOSAL RESECTION N/A 08/26/2019   Procedure: ENDOSCOPIC MUCOSAL RESECTION;  Surgeon: Yetta Flock, MD;  [facility];  Service: Gastroenterology;  Laterality: N/A;  . INGUINAL HERNIA REPAIR Bilateral 05/21/2014   Procedure: OPEN BILATERAL INGUINAL HERNIA REPAIRS WITH MESH;  Surgeon: Donnie [place], MD;  Location: [facility];  Service: General;  Laterality: Bilateral;  . POLYPECTOMY  03/25/2019   Procedure: POLYPECTOMY;  Surgeon: Yetta Flock, MD;  Location: [facility];  Service: Gastroenterology;;  . POLYPECTOMY  08/26/2019   Procedure: POLYPECTOMY;  Surgeon: Yetta Flock, MD;  [facility];  Service: Gastroenterology;;  . Woodward Ku  08/26/2019   Procedure: Sprayed methylene blue;  Surgeon: Yetta Flock, MD;  [facility];  Service: Gastroenterology;;  . SMALL INTESTINE SURGERY  1968, 2001    related to Chrohn's disease  . TONSILLECTOMY     Family History  Problem Relation Age of Onset  . Deep vein thrombosis Mother   . Heart disease Father   . Stomach cancer Neg Hx   . Colon cancer Neg Hx   . Esophageal cancer Neg Hx   .  Pancreatic cancer Neg Hx   . Rectal cancer Neg Hx   . Colon polyps Neg Hx    Social History   Socioeconomic History  . Marital status: Married    Spouse name: Not on file  . Number of children: 3  . Years of education: Not on file  . Highest education level: Not on file  Occupational History  . Occupation: Retired  Tobacco Use  . Smoking status: Former Smoker    Quit date: 04/01/1976    Years since quitting: 43.4   . Smokeless tobacco: Never Used  Vaping Use  . Vaping Use: Never used  Substance and Sexual Activity  . Alcohol use: Yes    Comment: 3-4  glass wine  weekly  . Drug use: No  . Sexual activity: Never  Other Topics Concern  . Not on file  Social History Narrative  . Not on file   Social Determinants of Health   Financial Resource Strain:   . Difficulty of Paying Living Expenses: Not on file  Food Insecurity:   . Worried About Charity fundraiser in the Last Year: Not on file  . Ran Out of Food in the Last Year: Not on file  Transportation Needs:   . Lack of Transportation (Medical): Not on file  . Lack of Transportation (Non-Medical): Not on file  Physical Activity:   . Days of Exercise per Week: Not on file  . Minutes of Exercise per Session: Not on file  Stress:   . Feeling of Stress : Not on file  Social Connections:   . Frequency of Communication with Friends and Family: Not on file  . Frequency of Social Gatherings with Friends and Family: Not on file  . Attends Religious Services: Not on file  . Active Member of Clubs or Organizations: Not on file  . Attends Archivist Meetings: Not on file  . Marital Status: Not on file   No Known Allergies  Medications  (Not in a hospital admission)    Vitals  Weight:  [84.1 kg] 84.1 kg (09/01 1300)  Body mass index is 25.86 kg/m.  Physical Exam   General: Laying comfortably in bed; in no acute distress.  HENT: Normal oropharynx and mucosa. Normal external appearance of ears and nose. Neck: Supple, no pain or tenderness CV: No JVD. No peripheral edema. Pulmonary: Symmetric Chest rise. Normal respiratory effort. Abdomen: Soft to touch, non-tender Ext: No cyanosis, edema, or deformity  Skin: No rash. Normal palpation of skin.   Musculoskeletal: Normal digits and nails by inspection. No clubbing.  Neurologic Examination  Mental status/Cognition: Alert, oriented to self, place, month and year, good  attention. Speech/language: Fluent, comprehension intact, object naming intact, repetition intact. Cranial nerves:   CN II Pupils equal and reactive to light, no VF deficits   CN III,IV,VI EOM intact, no gaze preference or deviation, no nystagmus   CN V normal sensation in V1, V2, and V3 segments bilaterally   CN VII Mild flattening of R nasolabial fold and drooping of the angle of mouth.   CN VIII normal hearing to speech   CN IX & X normal palatal elevation, no uvular deviation    CN XI 5/5 head turn and 5/5 shoulder shrug bilaterally    CN XII midline tongue protrusion    Motor:  Muscle bulk: normal, tone normal, pronator drift none Mvmt Root Nerve  Muscle Right Left Comments  SA C5/6 Ax Deltoid 5 5   EF C5/6 Mc Biceps  5 5   EE C6/7/8 Rad Triceps 5 5   WF C6/7 Med FCR 5 5   WE C7/8 PIN ECU 5 5   F Ab C8/T1 U ADM/FDI 5 5   HF L1/2/3 Fem Illopsoas 5 5   KE L2/3/4 Fem Quad 5 5   DF L4/5 D Peron Tib Ant 5 5   PF S1/2 Tibial Grc/Sol 5 5    Reflexes:  Right Left Comments  Pectoralis      Biceps (C5/6) 1 1   Brachioradialis (C5/6) 1 1    Triceps (C6/7) 1 1    Patellar (L3/4) 1 1    Achilles (S1) 1 1    Hoffman      Plantar     Jaw jerk    Sensation:  Light touch Intact through out to light touch   Pin prick    Temperature    Vibration   Proprioception    Coordination/Complex Motor:  - Finger to Nose intact - Heel to shin intact - Rapid alternating movement normal - Gait: Deferred.  Labs   Lab Results  Component Value Date   NA 139 09/11/2019   K 4.0 09/11/2019   CL 101 09/11/2019   CO2 28 03/10/2019   GLUCOSE 96 09/11/2019   BUN 14 09/11/2019   CREATININE 1.00 09/11/2019   CALCIUM 9.1 03/10/2019   ALBUMIN 3.9 03/10/2019   AST 20 03/10/2019   ALT 17 03/10/2019   ALKPHOS 47 03/10/2019   BILITOT 1.0 03/10/2019   GFRNONAA >60 03/10/2019   GFRAA >60 03/10/2019     Imaging and Diagnostic studies  CTH without contrast: 1. No acute hemorrhage. No  evidence of acute large vascular territory infarct. 2. Focal hypodensity within the left thalamus is age-indeterminant without priors, but favored remote. MRI could further evaluate for acute infarct if clinically indicated. 3. Presumed chronic microvascular ischemic disease.  Impression   CARRON JAGGI is a 86 y.o. male with PMH significant for colon polyps, crohn's dz who presents with acute onset L Facial droop and garbled speech with a LKW of 1115AM on 09/11/19. By the time, he got to the hospital, his only deficit was L facial droop. NIHSS of 1. No tPA due to rapidly resolving symptoms. No thrombectomy due to NIHSS < 6. I suspect that this is either a TIA or a minor ischemic stroke. Eye closure is symmetric with symmetric forehead frowning and not particularly consistent with Bell's palsy.  Recommendations   - Frequent Neuro checks while he is within the tPA Window. - Recommend MRI Brain with and without contrast given his hx of colon cancer. - Recommend MRA head without contrast with Carotid Duplex. - TTE - pending - Lipid panel - ordered  - Statin - Atorvastatin 73m daily - A1C - ordered  - Antithrombotic - Aspirin 864mdaily - DVT ppx - SQ Heparin - SBP goal - permissive hypertension first 24 h < 220/110. Hold home meds.  - Telemetry monitoring for arrythmia - Swallow screen - Stroke education - PT/OT/SLP consult  ______________________________________________________________________   Thank you for the opportunity to take part in the care of this patient. If you have any further questions, please contact the neurology consultation attending.  Signed,  [place]ager Number [phone]

## 2019-09-11 NOTE — Code Documentation (Signed)
Stroke Response Nurse Documentation Code Documentation  Michael [name] is a 86 y.o. male arriving to [facility] ED via [facility] on 09/11/19 with past medical hx of chron's disease.   Patient from home where he was LKW at 1115. Played tennis this morning. Patient's wife noticed later he wasn't walking per usual, he had a facial droop and speech was garbled. Code stroke was activated by EMS.  Stroke team at the bedside on patient arrival. Labs drawn and patient cleared for CT by Dr. Stark Jock. Patient to CT with team. NIHSS 1, see documentation for details and code stroke times. Patient with right facial droop on exam. The following imaging was completed: CT. Patient is not a candidate for tPA due to too mild to treat.   Care/Plan monitor closely while in window. Notify provider if NIHSS increases. Bedside handoff with ED RN Raquel Sarna.    Leverne Humbles Stroke Response RN

## 2019-09-11 NOTE — ED Notes (Signed)
Ivin Booty RN daughter: [phone]

## 2019-09-11 NOTE — Progress Notes (Signed)
Carotid duplex has been completed.   Preliminary results in CV Proc.   Abram Sander 09/11/2019 3:25 PM

## 2019-09-11 NOTE — H&P (Signed)
History and Physical    Michael [name]:[id number] DOB: 06-17-1932 DOA: 09/11/2019  PCP: Binnie Rail, MD (Confirm with patient/family/NH records and if not entered, this has to be entered at [facility] point of entry) Patient coming from: Home  I have personally briefly reviewed patient's old medical records in [place]  Chief Complaint: Trouble talking  HPI: Michael [name] is a 86 y.o. male with medical history significant of Crohn's disease, chronic iron deficiency anemia, glaucoma, vitamin B12 deficiency on injections, presented with new onset of short memory loss and trouble speaking.  Symptoms happened this morning around lunchtime, patient suddenly asked about what he ate for breakfast, which apparently he just ate few hours ago, and wife found his speech was garbled, patient remember that he had no trouble understanding or forming sentences in his mind but had a hard time to speak out.  Family called 21, EMS arrived found patient had left facial droop and some speech problems.  Patient denied any blurry vision headache or numbness weakness of any of the limbs.  No fever chills or neck pains.  Further denied any changes of his medications recently. ED Course: Physical exam found patient had left facial droop, no TPA offered given mild symptoms and facial droop then later resolved and patient speech also recovered.  CT showed probably remote L thalamic lacunar infarct.  Blood pressure significant elevated, systolic in the one ninety, patient and family report patient usually blood pressure in the 130s.  Review of Systems: As per HPI otherwise 14 point review of systems negative.    Past Medical History:  Diagnosis Date  . Adrenal insufficiency ([place])   . Anemia   . Avascular necrosis ([place])   . Crohn's disease ([place])   . Glaucoma   . Inguinal hernia recurrent bilateral   . Low testosterone   . Osteopenia   . Retinal ischemia     Past Surgical History:  Procedure Laterality  Date  . ABDOMINAL SURGERY    . COLONOSCOPY N/A 12/13/2015   Procedure: COLONOSCOPY;  Surgeon: Laurence Spates, MD;  Location: [facility];  Service: Endoscopy;  Laterality: N/A;  . COLONOSCOPY WITH PROPOFOL N/A 03/25/2019   Procedure: COLONOSCOPY WITH PROPOFOL;  Surgeon: Yetta Flock, MD;  Location: [facility];  Service: Gastroenterology;  Laterality: N/A;  . COLONOSCOPY WITH PROPOFOL N/A 08/26/2019   Procedure: COLONOSCOPY WITH PROPOFOL;  Surgeon: Yetta Flock, MD;  Location: [facility];  Service: Gastroenterology;  Laterality: N/A;  . ENDOSCOPIC MUCOSAL RESECTION N/A 08/26/2019   Procedure: ENDOSCOPIC MUCOSAL RESECTION;  Surgeon: Yetta Flock, MD;  Location: [facility];  Service: Gastroenterology;  Laterality: N/A;  . INGUINAL HERNIA REPAIR Bilateral 05/21/2014   Procedure: OPEN BILATERAL INGUINAL HERNIA REPAIRS WITH MESH;  Surgeon: Donnie Mesa, MD;  Location: [facility] [facility];  Service: General;  Laterality: Bilateral;  . POLYPECTOMY  03/25/2019   Procedure: POLYPECTOMY;  Surgeon: Yetta Flock, MD;  Location: [facility];  Service: Gastroenterology;;  . POLYPECTOMY  08/26/2019   Procedure: POLYPECTOMY;  Surgeon: Yetta Flock, MD;  Location: [facility];  Service: Gastroenterology;;  . Woodward Ku  08/26/2019   Procedure: Sprayed methylene blue;  Surgeon: Yetta Flock, MD;  Location: [facility];  Service: Gastroenterology;;  . SMALL INTESTINE SURGERY  1968, 2001    related to Chrohn's disease  . TONSILLECTOMY       reports that he quit smoking about 43 years ago. He has never used smokeless tobacco. He reports current alcohol use.  He reports that he does not use drugs.  No Known Allergies  Family History  Problem Relation Age of Onset  . Deep vein thrombosis Mother   . Heart disease Father   . Stomach cancer Neg Hx   . Colon cancer Neg Hx   . Esophageal cancer Neg Hx   . Pancreatic cancer Neg Hx   . Rectal cancer Neg  Hx   . Colon polyps Neg Hx      Prior to Admission medications   Medication Sig Start Date End Date Taking? Authorizing Provider  acetaminophen (TYLENOL) 500 MG tablet Take 500-1,000 mg by mouth every 8 (eight) hours as needed for moderate pain or headache.    [provider]  Ascorbic Acid (VITAMIN C) 1000 MG tablet Take 1,000 mg by mouth daily.    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  10/03/17   [provider]  brimonidine (ALPHAGAN) 0.2 % ophthalmic solution Place 1 drop into the left eye 2 (two) times daily.  11/20/15   [provider]  budesonide (ENTOCORT EC) 3 MG 24 hr capsule Take 3-9 mg by mouth See admin instructions. Take 9 mg daily for 4 weeks then 6 mg daily for 2 weeks then 3 mg daily for 2 weeks then stop    [provider]  budesonide (ENTOCORT EC) 3 MG 24 hr capsule Take 1 capsule (3 mg total) by mouth daily. Take 3 tabs daily for 4 weeks, then 2 tabs / day for 2 weeks, and then 1 tab / day for 2 weeks until done 09/04/19   Yetta Flock, MD  Calcium Carb-Cholecalciferol (CALCIUM 600+D3) 600-800 MG-UNIT TABS Take 2 tablets by mouth daily.    [provider]  Cholecalciferol (VITAMIN D) 2000 units CAPS Take 1 capsule (2,000 Units total) by mouth daily. 08/22/16   Armbruster, Carlota Raspberry, MD  cyanocobalamin (,VITAMIN B-12,) 1000 MCG/ML injection INJECT 1 ML INTO THE MUSCLE EVERY 30 DAYS Patient taking differently: Inject 1,000 mcg into the skin every 30 (thirty) days.  07/12/19   Binnie Rail, MD  dorzolamide-timolol (COSOPT) 22.3-6.8 MG/ML ophthalmic solution Place 1 drop into the left eye 2 (two) times daily.  11/05/15   [provider]  ferrous sulfate 325 (65 FE) MG tablet Take 325 mg by mouth 3 (three) times a week.     [provider]  latanoprost (XALATAN) 0.005 % ophthalmic  solution Place 1 drop into the left eye at bedtime.  09/12/17   [provider]  loperamide (IMODIUM A-D) 2 MG tablet Take 2 mg by mouth daily.     [provider]  Netarsudil Dimesylate (RHOPRESSA) 0.02 % SOLN Place 1 drop into the left eye at bedtime.     [provider]  omeprazole (PRILOSEC) 20 MG capsule Take 20 mg by mouth daily.     [provider]  prednisoLONE acetate (PRED FORTE) 1 % ophthalmic suspension Place 1 drop into the right eye 4 (four) times daily.  08/28/18   [provider]  Sodium Sulfate-Mag Sulfate-KCl (SUTAB) 580 886 6941 MG TABS Take 1 kit by mouth as directed. 08/02/19   Armbruster, Carlota Raspberry, MD  triamcinolone (NASACORT ALLERGY 24HR) 55 MCG/ACT AERO nasal inhaler Place 2 sprays into the nose daily as needed (allergies).    [provider]    Physical Exam: Vitals:   09/11/19 1350 09/11/19 1354 09/11/19 1400 09/11/19 1415  BP:  (!) 186/85 (!) 176/92 (!) 174/95  Pulse:  75 72 77  Resp:  17 17 (!) 24  Temp:  97.7 F (36.5 C)    TempSrc:  Oral    SpO2:  98% 99% 96%  Weight: 84.1 kg     Height: 5' 11"  (1.803 m)       Constitutional: NAD, calm, comfortable Vitals:   09/11/19 1350 09/11/19 1354 09/11/19 1400 09/11/19 1415  BP:  (!) 186/85 (!) 176/92 (!) 174/95  Pulse:  75 72 77  Resp:  17 17 (!) 24  Temp:  97.7 F (36.5 C)    TempSrc:  Oral    SpO2:  98% 99% 96%  Weight: 84.1 kg     Height: 5' 11"  (1.803 m)      Eyes: PERRL, lids and conjunctivae normal ENMT: Mucous membranes are moist. Posterior pharynx clear of any exudate or lesions.Normal dentition.  Neck: normal, supple, no masses, no thyromegaly Respiratory: clear to auscultation bilaterally, no wheezing, no crackles. Normal respiratory effort. No accessory muscle use.  Cardiovascular: Regular rate and rhythm, no murmurs / rubs / gallops. No extremity edema. 2+ pedal pulses. No carotid bruits.  Abdomen: no tenderness, no masses palpated. No  hepatosplenomegaly. Bowel sounds positive.  Musculoskeletal: no clubbing / cyanosis. No joint deformity upper and lower extremities. Good ROM, no contractures. Normal muscle tone.  Skin: no rashes, lesions, ulcers. No induration Neurologic: CN 2-12 grossly intact. Sensation intact, DTR normal. Strength 5/5 in all 4.  Psychiatric: Normal judgment and insight. Alert and oriented x 3. Normal mood.     Labs on Admission: I have personally reviewed following labs and imaging studies  CBC: Recent Labs  Lab 09/11/19 1315 09/11/19 1320  WBC 7.5  --   NEUTROABS 4.6  --   HGB 11.9* 11.6*  HCT 36.5* 34.0*  MCV 95.3  --   PLT 167  --    Basic Metabolic Panel: Recent Labs  Lab 09/11/19 1315 09/11/19 1320  NA 138 139  K 4.1 4.0  CL 102 101  CO2 27  --   GLUCOSE 100* 96  BUN 13 14  CREATININE 1.06 1.00  CALCIUM 9.0  --    GFR: Estimated Creatinine Clearance: 57.5 mL/min (by C-G formula based on SCr of 1 mg/dL). Liver Function Tests: Recent Labs  Lab 09/11/19 1315  AST 13*  ALT 10  ALKPHOS 45  BILITOT 1.2  PROT 6.5  ALBUMIN 3.6   No results for input(s): LIPASE, AMYLASE in the last 168 hours. No results for input(s): AMMONIA in the last 168 hours. Coagulation Profile: Recent Labs  Lab 09/11/19 1315  INR 1.0   Cardiac Enzymes: No results for input(s): CKTOTAL, CKMB, CKMBINDEX, TROPONINI in the last 168 hours. BNP (last 3 results) No results for input(s): PROBNP in the last 8760 hours. HbA1C: No results for input(s): HGBA1C in the last 72 hours. CBG: No results for input(s): GLUCAP in the last 168 hours. Lipid Profile: No results for input(s): CHOL, HDL, LDLCALC, TRIG, CHOLHDL, LDLDIRECT in the last 72 hours. Thyroid Function Tests: No results for input(s): TSH, T4TOTAL, FREET4, T3FREE, THYROIDAB in the last 72 hours. Anemia Panel: No results for input(s): VITAMINB12, FOLATE, FERRITIN, TIBC, IRON, RETICCTPCT in the last 72 hours. Urine analysis:    Component  Value Date/Time   COLORURINE YELLOW 05/08/2019 1623   APPEARANCEUR CLEAR 05/08/2019 1623   LABSPEC <=1.005 (A) 05/08/2019 1623   PHURINE 6.0 05/08/2019 1623   GLUCOSEU NEGATIVE 05/08/2019 1623  HGBUR NEGATIVE 05/08/2019 1623   BILIRUBINUR NEGATIVE 05/08/2019 1623   KETONESUR NEGATIVE 05/08/2019 1623   PROTEINUR NEGATIVE 03/10/2019 1130   UROBILINOGEN 0.2 05/08/2019 1623   NITRITE NEGATIVE 05/08/2019 1623   LEUKOCYTESUR NEGATIVE 05/08/2019 1623    Radiological Exams on Admission: CT HEAD CODE STROKE WO CONTRAST  Result Date: 09/11/2019 CLINICAL DATA:  Code stroke. EXAM: CT HEAD WITHOUT CONTRAST TECHNIQUE: Contiguous axial images were obtained from the base of the skull through the vertex without intravenous contrast. COMPARISON:  None. FINDINGS: Brain: No acute hemorrhage. Focal hypodensity within the left thalamus. Scattered white matter hypodensity, including evidence in the left external capsule hypodensity, which may relate to chronic microvascular ischemic disease. Mild to moderate diffuse cerebral atrophy with ex vacuo ventricular dilation. No hydrocephalus. Vascular: No definite hyperdense vessel.  Calcific atherosclerosis. Skull: Normal. Negative for fracture or focal lesion. Sinuses/Orbits: Remote right medial orbital wall fracture. Mucosal thickening involving intra great maxillary sinus. No air-fluid levels. Other: No mastoid effusion. ASPECTS ([place] Stroke Program Early CT Score): 10 IMPRESSION: 1. No acute hemorrhage. No evidence of acute large vascular territory infarct. 2. Focal hypodensity within the left thalamus is age-indeterminant without priors, but favored remote. MRI could further evaluate for acute infarct if clinically indicated. 3. Presumed chronic microvascular ischemic disease. Code stroke imaging results were communicated on 09/11/2019 at 1:40 pm to provider Dr. Annice Pih via telephone, who verbally acknowledged these results. Electronically Signed   By: Margaretha Sheffield MD   On: 09/11/2019 13:46    EKG: Independently reviewed.  Chronic RBBB  Assessment/Plan Active Problems:   TIA (transient ischemic attack)  (please populate well all problems here in Problem List. (For example, if patient is on BP meds at home and you resume or decide to hold them, it is a problem that needs to be her. Same for CAD, COPD, HLD and so on)  TIA with left facial droop and aphasia/disarticulate problems -ASA and Statin -CT so suspicious signs of remote left thalamus lacunar infarct and patient does not have history of hypertension and multiple record in the past showed that patient blood pressure was within normal limits. -Allow permissive hypertension for 2 to 3 days, as needed hydralazine for blood pressure 180/110. -MRI MRA to follow -Discussed with patient and his daughter at bedside  Accelerated blood pressure -Treatment as above, may need ambulatory blood pressure monitoring as outpatient  Crohn's disease -Continue budesonide  Chronic iron deficiency anemia -Continue iron supplement  DVT prophylaxis: Lovenox Code Status: Full code Family Communication: Daughter at bedside Disposition Plan: Expect less than 24 hours hospital stay for TIA work-up Consults called: Neurology Admission status: Telemetry obs   Lequita Halt MD [facility] Pager [phone]  09/11/2019, 2:32 PM

## 2019-09-12 ENCOUNTER — Telehealth: Payer: Self-pay | Admitting: Neurology

## 2019-09-12 ENCOUNTER — Other Ambulatory Visit (HOSPITAL_COMMUNITY): Payer: Medicare Other

## 2019-09-12 ENCOUNTER — Observation Stay (HOSPITAL_BASED_OUTPATIENT_CLINIC_OR_DEPARTMENT_OTHER): Payer: Medicare Other

## 2019-09-12 DIAGNOSIS — I361 Nonrheumatic tricuspid (valve) insufficiency: Secondary | ICD-10-CM

## 2019-09-12 DIAGNOSIS — I371 Nonrheumatic pulmonary valve insufficiency: Secondary | ICD-10-CM

## 2019-09-12 DIAGNOSIS — G459 Transient cerebral ischemic attack, unspecified: Secondary | ICD-10-CM | POA: Diagnosis not present

## 2019-09-12 LAB — LIPID PANEL
Cholesterol: 117 mg/dL (ref 0–200)
HDL: 34 mg/dL — ABNORMAL LOW (ref >40)
LDL Cholesterol: 54 mg/dL (ref 0–99)
Total CHOL/HDL Ratio: 3.4 RATIO
Triglycerides: 143 mg/dL (ref <150)
VLDL: 29 mg/dL (ref 0–40)

## 2019-09-12 LAB — HEMOGLOBIN A1C
Hgb A1c MFr Bld: 5.4 % (ref 4.8–5.6)
Mean Plasma Glucose: 108.28 mg/dL

## 2019-09-12 LAB — ECHOCARDIOGRAM COMPLETE BUBBLE STUDY
Area-P 1/2: 3.31 cm2
S' Lateral: 3.6 cm

## 2019-09-12 MED ORDER — ATORVASTATIN CALCIUM 40 MG PO TABS: 40.0000 mg | ORAL_TABLET | Freq: Every day | ORAL | 0 refills | Status: DC

## 2019-09-12 MED ORDER — STROKE: EARLY STAGES OF RECOVERY BOOK: 1.0000 | Freq: Once | 0 refills | Status: AC

## 2019-09-12 MED ORDER — DIPHENHYDRAMINE HCL 25 MG PO CAPS
25.0000 mg | ORAL_CAPSULE | Freq: Four times a day (QID) | ORAL | Status: DC | PRN
  Administered 2019-09-12: 25 mg via ORAL
  Filled 2019-09-12: qty 1

## 2019-09-12 MED ORDER — ASPIRIN 81 MG PO CHEW: 81.0000 mg | CHEWABLE_TABLET | Freq: Every day | ORAL | 0 refills | Status: AC

## 2019-09-12 NOTE — ED Notes (Signed)
Pt ambulated to restroom with one person assist for safety. Pt alert and oriented X4

## 2019-09-12 NOTE — Evaluation (Signed)
Physical Therapy Evaluation and Discharge Patient Details Name: Michael [name] MRN: [id number] DOB: Jul 09, 1932 Today's Date: 09/12/2019   History of Present Illness  Pt is an 86 y/o male admitted secondary to memory loss and increased difficulty speaking. MRI negative and thought to be secondary to TIA. PMH includes chron's disease and glaucoma.   Clinical Impression  Patient evaluated by Physical Therapy with no further acute PT needs identified. All education has been completed and the patient has no further questions. Pt overall steady with mobility tasks functioning at a supervision to independent level. One mild LOB with horizontal head turns but able to self correct. Per pt and daughter, feel pt is back to baseline. Educated about "BE FAST" acronym in recognizing CVA symptoms. See below for any follow-up Physical Therapy or equipment needs. PT is signing off. Thank you for this referral. If needs change, please re-consult.      Follow Up Recommendations No PT follow up    Equipment Recommendations  None recommended by PT    Recommendations for Other Services       Precautions / Restrictions Precautions Precautions: None Restrictions Weight Bearing Restrictions: No      Mobility  Bed Mobility Overal bed mobility: Independent                Transfers Overall transfer level: Modified independent               General transfer comment: Increased tim, but no physical assist  Ambulation/Gait Ambulation/Gait assistance: Supervision Gait Distance (Feet): 150 Feet Assistive device: None Gait Pattern/deviations: Step-through pattern;Decreased stride length Gait velocity: decreased   General Gait Details: Slower gait, but over all fairly steady. Slight instability when looking left. Otherwise was able to perform DGI tasks without LOB.   Stairs            Wheelchair Mobility    Modified Rankin (Stroke Patients Only)       Balance Overall balance  assessment: Needs assistance Sitting-balance support: No upper extremity supported;Feet supported Sitting balance-Leahy Scale: Normal     Standing balance support: No upper extremity supported;During functional activity Standing balance-Leahy Scale: Good                   Standardized Balance Assessment Standardized Balance Assessment : Dynamic Gait Index   Dynamic Gait Index Level Surface: Normal Change in Gait Speed: Normal Gait with Horizontal Head Turns: Moderate Impairment Gait with Vertical Head Turns: Normal Gait and Pivot Turn: Mild Impairment Step Over Obstacle: Normal Step Around Obstacles: Normal       Pertinent Vitals/Pain Pain Assessment: No/denies pain    Home Living Family/patient expects to be discharged to:: Private residence Living Arrangements: Spouse/significant other Available Help at Discharge: Family;Available 24 hours/day Type of Home: House Home Access: Level entry     Home Layout: One level Home Equipment: Shower seat;Cane - single point      Prior Function Level of Independence: Independent         Comments: Pt likes to play tennis     Hand Dominance        Extremity/Trunk Assessment   Upper Extremity Assessment Upper Extremity Assessment: Overall WFL for tasks assessed    Lower Extremity Assessment Lower Extremity Assessment: Overall WFL for tasks assessed    Cervical / Trunk Assessment Cervical / Trunk Assessment: Normal  Communication   Communication: HOH  Cognition Arousal/Alertness: Awake/alert Behavior During Therapy: WFL for tasks assessed/performed Overall Cognitive Status: Within Functional Limits for tasks assessed  General Comments General comments (skin integrity, edema, etc.): Pt's daughter present during session     Exercises     Assessment/Plan    PT Assessment Patent does not need any further PT services  PT Problem List         PT  Treatment Interventions      PT Goals (Current goals can be found in the Care Plan section)  Acute Rehab PT Goals Patient Stated Goal: to go home PT Goal Formulation: With patient Time For Goal Achievement: 09/12/19 Potential to Achieve Goals: Good    Frequency     Barriers to discharge        Co-evaluation               AM-PAC PT "6 Clicks" Mobility  Outcome Measure Help needed turning from your back to your side while in a flat bed without using bedrails?: None Help needed moving from lying on your back to sitting on the side of a flat bed without using bedrails?: None Help needed moving to and from a bed to a chair (including a wheelchair)?: None Help needed standing up from a chair using your arms (e.g., wheelchair or bedside chair)?: None Help needed to walk in hospital room?: None Help needed climbing 3-5 steps with a railing? : A Lot 6 Click Score: 22    End of Session Equipment Utilized During Treatment: Gait belt Activity Tolerance: Patient tolerated treatment well Patient left: in bed;with call bell/phone within reach;with family/visitor present (sitting EOB ) Nurse Communication: Mobility status PT Visit Diagnosis: Other symptoms and signs involving the nervous system (R29.898)    Time: 1035-1050 PT Time Calculation (min) (ACUTE ONLY): 15 min   Charges:   PT Evaluation $PT Eval Low Complexity: 1 Low          Michael [name], DPT  Acute Rehabilitation Services  Pager: [phone] Office: [phone]   Michael [name] 09/12/2019, 2:34 PM

## 2019-09-12 NOTE — Progress Notes (Signed)
OT Cancellation Note  Patient Details Name: Michael [name] MRN: [id number] DOB: 04/15/1932   Cancelled Treatment:    Reason Eval/Treat Not Completed: OT screened, no needs identified, will sign off (at Baseline per daughter)  [name] 09/12/2019, 11:10 AM  Maurie Boettcher, OT/L   Acute OT Clinical Specialist [place] Pager [phone] Office [phone]

## 2019-09-12 NOTE — Discharge Instructions (Signed)
Transient Ischemic Attack  A transient ischemic attack (TIA) is a "warning stroke" that causes stroke-like symptoms that go away quickly. A TIA does not cause lasting damage to the brain. But having a TIA is a sign that you may be at risk for a stroke. Lifestyle changes and medical treatments can help prevent a stroke. It is important to know the symptoms of a TIA and what to do. Get help right away, even if your symptoms go away. The symptoms of a TIA are the same as those of a stroke. They can happen fast, and they usually go away within minutes or hours. They can include:  Weakness or loss of feeling in your face, arm, or leg. This often happens on one side of your body.  Trouble walking.  Trouble moving your arms or legs.  Trouble talking or understanding what people are saying.  Trouble seeing.  Seeing two of one object (double vision).  Feeling dizzy.  Feeling confused.  Loss of balance or coordination.  Feeling sick to your stomach (nauseous) and throwing up (vomiting).  A very bad headache for no reason. What increases the risk? Certain things may make you more likely to have a TIA. Some of these are things that you can change, such as:  Being very overweight (obese).  Using products that contain nicotine or tobacco, such as cigarettes and e-cigarettes.  Taking birth control pills.  Not being active.  Drinking too much alcohol.  Using drugs. Other risk factors include:  Having an irregular heartbeat (atrial fibrillation).  Being African American or Hispanic.  Having had blood clots, stroke, TIA, or heart attack in the past.  Being a woman with a history of high blood pressure in pregnancy (preeclampsia).  Being over the age of 9.  Being male.  Having family history of stroke.  Having the following diseases or conditions: ? High blood pressure. ? High cholesterol. ? Diabetes. ? Heart disease. ? Sickle cell disease. ? Sleep apnea. ? Migraine  headache. ? Long-term (chronic) diseases that cause soreness and swelling (inflammation). ? Disorders that affect how your blood clots. Follow these instructions at home: Medicines   Take over-the-counter and prescription medicines only as told by your doctor.  If you were told to take aspirin or another medicine to thin your blood, take it exactly as told by your doctor. ? Taking too much of the medicine can cause bleeding. ? Taking too little of the medicine may not work to treat the problem. Eating and drinking   Eat 5 or more servings of fruits and vegetables each day.  Follow instructions from your doctor about your diet. You may need to follow a certain diet to help lower your risk of having a stroke. You may need to: ? Eat a diet that is low in fat and salt. ? Eat foods that contain a lot of fiber. ? Limit the amount of carbohydrates and sugar in your diet.  Limit alcohol intake to 1 drink a day for nonpregnant women and 2 drinks a day for men. One drink equals 12 oz of beer, 5 oz of wine, or 1 oz of hard liquor. General instructions  Keep a healthy weight.  Stay active. Try to get at least 30 minutes of activity on all or most days.  Find out if you have a condition called sleep apnea. Get treatment if needed.  Do not use any products that contain nicotine or tobacco, such as cigarettes and e-cigarettes. If you need help quitting,  ask your doctor.  Do not abuse drugs.  Keep all follow-up visits as told by your doctor. This is important. Get help right away if:  You have any signs of stroke. "BE FAST" is an easy way to remember the main warning signs: ? B - Balance. Signs are dizziness, sudden trouble walking, or loss of balance. ? E - Eyes. Signs are trouble seeing or a sudden change in how you see. ? F - Face. Signs are sudden weakness or loss of feeling of the face, or the face or eyelid drooping on one side. ? A - Arms. Signs are weakness or loss of feeling in an  arm. This happens suddenly and usually on one side of the body. ? S - Speech. Signs are sudden trouble speaking, slurred speech, or trouble understanding what people say. ? T - Time. Time to call emergency services. Write down what time symptoms started.  You have other signs of stroke, such as: ? A sudden, very bad headache with no known cause. ? Feeling sick to your stomach (nausea). ? Throwing up (vomiting). ? Jerky movements that you cannot control (seizure). These symptoms may be an emergency. Do not wait to see if the symptoms will go away. Get medical help right away. Call your local emergency services (911 in the [place] Do not drive yourself to the hospital. Summary  A transient ischemic attack (TIA) is a "warning stroke" that causes stroke-like symptoms that go away quickly.  A TIA is a medical emergency. Get help right away, even if your symptoms go away.  A TIA does not cause lasting damage to the brain.  Having a TIA is a sign that you may be at risk for a stroke. Lifestyle changes and medical treatments can help prevent a stroke. This information is not intended to replace advice given to you by your health care provider. Make sure you discuss any questions you have with your health care provider. Document Revised: 09/22/2017 Document Reviewed: 03/30/2016 Elsevier Patient Education  [place].

## 2019-09-12 NOTE — ED Notes (Signed)
Pt not keeping bp cuff attached  Keeps getting disconnected from his  Cardiac  Wires  Slept for 2 hours awake again

## 2019-09-12 NOTE — Telephone Encounter (Signed)
I returned call from patient's daughter Valere Dross who called [employer] service stating that the patient had just been discharged earlier today with admission for TIA and did fine until this evening when all of a sudden he appears to be confused and is having trouble speaking and this has been going on for 30 minutes.  They felt his previous TIA was similar except he had clear facial droop which she does not have during the current episode.  Advised her to call 911 and bring him to the hospital as he may be having a stroke.  Other possibilities include seizure with confusion.  He appears to have no cognitive impairment or dementia at baseline prior to these episodes..  She voiced understanding and stated that she would observe him for few minutes and then make a decision.

## 2019-09-12 NOTE — ED Notes (Signed)
Pt woke up from sound sleep and was sl confused by the time  I arrived in the room the neuro check was good

## 2019-09-12 NOTE — Progress Notes (Signed)
Pt seen and evaluated by PT. Requiring supervision for mobility tasks and reports feels back to baseline. No follow up recommendations at this time. Formal note to follow.   Reuel Derby, PT, DPT  [facility]  Pager: [phone] Office: [phone]

## 2019-09-12 NOTE — ED Notes (Signed)
Spoke with daughter, Ivin Booty. She would like to be notified when Drs. Make rounds. [phone] or [phone]. Ivin Booty verbalized understanding of Echo and OT being done this morning.

## 2019-09-12 NOTE — Progress Notes (Signed)
  Echocardiogram 2D Echocardiogram has been performed.  Michael [name] 09/12/2019, 9:42 AM

## 2019-09-12 NOTE — Discharge Summary (Signed)
Discharge Summary  Michael [name] XBW:[id number] DOB: [birth year]  PCP: Binnie Rail, MD  Admit date: 09/11/2019 Discharge date: 09/12/2019  Time spent: 35 minutes  Recommendations for Outpatient Follow-up:  1. Follow-up with neurology 2. Follow-up with your primary care provider 3. Take your medications as prescribed  Discharge Diagnoses:  Active Hospital Problems   Diagnosis Date Noted  . TIA (transient ischemic attack) 09/11/2019    Resolved Hospital Problems  No resolved problems to display.    Discharge Condition: Stable  Diet recommendation: Resume previous diet.  Vitals:   09/12/19 0818 09/12/19 1209  BP: (!) 143/68 137/65  Pulse: 94 95  Resp: (!) 21 20  Temp:    SpO2: 98% 99%    History of present illness:  HPI: Michael [name] is a 86 y.o. male with medical history significant of Crohn's disease, chronic iron deficiency anemia, glaucoma, vitamin B12 deficiency on injections, presented with new onset of short memory loss and trouble speaking. Onset on the morning of his presentation.  His wife found his speech was garbled.  He reports no trouble understanding or forming sentences in his mind but had a hard time to speak out.  Family called 31, EMS arrived found patient had left facial droop and some speech difficulty.    Brought into the ED for further evaluation.  ED Course: Patient had left facial droop,  code stroke called.  No TPA offered given mild symptoms and facial droop then later resolved and patient speech also recovered.  CT showed probably remote L thalamic lacunar infarct which was confirmed on MRI brain.  Blood pressure significantly elevated, systolic in the one ninety, patient and family report patient's blood pressure in the 130s at baseline.  Seen by neurology stroke team.  Council asked to admit.  09/12/19: Seen and examined at his bedside in the ED.  His symptoms have completely resolved.  MRI brain revealed evidence of prior left thalamic stroke.   MRA showed no proximal intracranial vessel occlusion or significant stenosis.  2D echo showed normal LVEF 55 to 60% with grade 1 diastolic dysfunction.  No evidence of PFO or thrombus.  Bilateral carotid Doppler ultrasound negative for any significant carotid stenosis.  Patient is eager to go home.  Okay to discharge from neuro standpoint.   Hospital Course:  Active Problems:   TIA (transient ischemic attack)  TIA with left facial droop and aphasia -CT head evidence of remote left thalamus lacunar infarct, confirmed on MRI brain  MRI brain revealed evidence of prior left thalamic stroke.  MRA showed no proximal intracranial vessel occlusion or significant stenosis.  2D echo showed normal LVEF 55 to 60% with grade 1 diastolic dysfunction.  No evidence of PFO or thrombus.  Bilateral carotid Doppler ultrasound negative for any significant carotid stenosis. LDL 54, goal less than 70 A1c 5.4, goal less than 7.0 Continue aspirin 81 mg daily and Lipitor 40 mg daily Follow-up with neurology outpatient  Prior CVA, incidentally found Patient states he was not aware of prior stroke Management as stated above  Resolving accelerated blood pressure Blood pressure trending down without any antihypertensives Last BP 137/65. Follow-up with your PCP  Crohn's disease -Continue home budesonide  Chronic iron deficiency anemia -Continue home iron supplement   Code Status: Full code  Consults called: Neurology   Discharge Exam: BP 137/65 (BP Location: Left Arm)   Pulse 95   Temp 97.7 F (36.5 C) (Oral)   Resp 20   Ht 5' 11"  (1.803 m)  Wt 84.1 kg   SpO2 99%   BMI 25.86 kg/m  . General: 86 y.o. year-old male well developed well nourished in no acute distress.  Alert and oriented x3. . Cardiovascular: Regular rate and rhythm with no rubs or gallops.  No thyromegaly or JVD noted.   Marland Kitchen Respiratory: Clear to auscultation with no wheezes or rales. Good inspiratory effort. . Abdomen: Soft  nontender nondistended with normal bowel sounds x4 quadrants. . Musculoskeletal: No lower extremity edema. 2/4 pulses in all 4 extremities. Marland Kitchen Psychiatry: Mood is appropriate for condition and setting  Discharge Instructions You were cared for by a hospitalist during your hospital stay. If you have any questions about your discharge medications or the care you received while you were in the hospital after you are discharged, you can call the unit and asked to speak with the hospitalist on call if the hospitalist that took care of you is not available. Once you are discharged, your primary care physician will handle any further medical issues. Please note that NO REFILLS for any discharge medications will be authorized once you are discharged, as it is imperative that you return to your primary care physician (or establish a relationship with a primary care physician if you do not have one) for your aftercare needs so that they can reassess your need for medications and monitor your lab values.   Allergies as of 09/12/2019   No Known Allergies     Medication List    STOP taking these medications   budesonide 3 MG 24 hr capsule Commonly known as: ENTOCORT EC   Nasacort Allergy 24HR 55 MCG/ACT Aero nasal inhaler Generic drug: triamcinolone   Sutab [phone] MG Tabs Generic drug: Sodium Sulfate-Mag Sulfate-KCl     TAKE these medications    stroke: mapping our early stages of recovery book Misc 1 each by Does not apply route once for 1 dose.   acetaminophen 500 MG tablet Commonly known as: TYLENOL Take 500-1,000 mg by mouth every 8 (eight) hours as needed for mild pain or headache.   aspirin 81 MG chewable tablet Chew 1 tablet (81 mg total) by mouth daily. Start taking on: September 13, 2019   atorvastatin 40 MG tablet Commonly known as: LIPITOR Take 1 tablet (40 mg total) by mouth daily. 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   brimonidine 0.2 % ophthalmic solution Commonly known as: ALPHAGAN Place 1 drop into the left eye See admin instructions. Instill 1 drop into the left eye twice a day- 6:45 AM and 5:50 PM   CALCIUM 1200 PO Take 1,200 mg by mouth daily with breakfast.   cyanocobalamin 1000 MCG/ML injection Commonly known as: (VITAMIN B-12) INJECT 1 ML INTO THE MUSCLE EVERY 30 DAYS What changed: See the new instructions.   dorzolamide-timolol 22.3-6.8 MG/ML ophthalmic solution Commonly known as: COSOPT Place 1 drop into the left eye See admin instructions. Instill 1 drop into the left eye twice a day- 6:35 AM and 5:30 PM   ferrous sulfate 325 (65 FE) MG tablet Take 325 mg by mouth every Monday, Wednesday, and Friday.   latanoprost 0.005 % ophthalmic solution Commonly known as: XALATAN Place 1 drop into the left eye See admin instructions. Instill 1 drop into the left eye at 6:10 PM daily   loperamide 2 MG tablet Commonly known as: IMODIUM A-D Take 2 mg  by mouth See admin instructions. Take 2 mg by mouth one to two times a day as needed/as directed   omeprazole 20 MG tablet Commonly known as: PRILOSEC OTC Take 20 mg by mouth daily as needed (for reflux symptoms).   prednisoLONE acetate 1 % ophthalmic suspension Commonly known as: PRED FORTE Place 1 drop into the right eye See admin instructions. Instill 1 drop into the right eye four times a day- 7:00 AM, 12:00 noon, 4:00 PM, and 6:00 PM   Rhopressa 0.02 % Soln Generic drug: Netarsudil Dimesylate Place 1 drop into the left eye See admin instructions. Instill 1 drop into the left eye at 6:15 PM daily   vitamin C 1000 MG tablet Take 1,000 mg by mouth daily with breakfast.   Vitamin D3 50 MCG (2000 UT) Tabs Take 2,000 Units by mouth daily with breakfast.      No Known Allergies  Follow-up Information    Binnie Rail,  MD. Call in 1 day(s).   Specialty: Internal Medicine Why: Please call for a post hospital follow-up appointment. Contact information: [place] [address] [phone]        Garvin Fila, MD. Call in 1 day(s).   Specialties: Neurology, Radiology Why: Please call for a post hospital follow-up appointment. Contact information: [address] [place] [address] [phone]                The results of significant diagnostics from this hospitalization (including imaging, microbiology, ancillary and laboratory) are listed below for reference.    Significant Diagnostic Studies: MR ANGIO HEAD WO CONTRAST  Result Date: 09/11/2019 CLINICAL DATA:  Left facial droop, abnormal speech EXAM: MRI HEAD WITHOUT AND WITH CONTRAST MRA HEAD WITHOUT CONTRAST TECHNIQUE: Multiplanar, multiecho pulse sequences of the brain and surrounding structures were obtained without and with intravenous contrast. Angiographic images of the head were obtained using MRA technique without contrast. CONTRAST:  74m GADAVIST GADOBUTROL 1 MMOL/ML IV SOLN COMPARISON:  None. FINDINGS: MRI HEAD Brain: There is no acute infarction or intracranial hemorrhage. There is no intracranial mass, mass effect, or edema. There is no hydrocephalus or extra-axial fluid collection. Prominence of the ventricles and sulci reflects generalized parenchymal volume loss. Patchy T2 hyperintensity in the supratentorial white matter is nonspecific but probably reflects mild to moderate chronic microvascular ischemic changes. There is a small chronic infarct of the left thalamus. No abnormal enhancement. Vascular: Major vessel flow voids at the skull base are preserved. Skull and upper cervical spine: Normal marrow signal is preserved. Sinuses/Orbits: Paranasal sinuses are aerated. Bilateral lens replacements. Other: Sella is unremarkable. Patchy left mastoid fluid opacification. MRA HEAD Intracranial internal  carotid arteries are patent. Middle and anterior cerebral arteries are patent. Intracranial vertebral arteries, basilar artery, posterior cerebral arteries are patent. Left posterior communicating artery is present. There is no significant stenosis or aneurysm. IMPRESSION: No evidence of recent infarction, hemorrhage, or mass. No abnormal enhancement. Mild to moderate chronic microvascular ischemic changes. Small chronic left thalamic infarct. No proximal intracranial vessel occlusion or significant stenosis. Electronically Signed   By: PMacy MisM.D.   On: 09/11/2019 17:48   MR BRAIN W WO CONTRAST  Result Date: 09/11/2019 CLINICAL DATA:  Left facial droop, abnormal speech EXAM: MRI HEAD WITHOUT AND WITH CONTRAST MRA HEAD WITHOUT CONTRAST TECHNIQUE: Multiplanar, multiecho pulse sequences of the brain and surrounding structures were obtained without and with intravenous contrast. Angiographic images of the head were obtained using MRA technique without contrast.  CONTRAST:  68m GADAVIST GADOBUTROL 1 MMOL/ML IV SOLN COMPARISON:  None. FINDINGS: MRI HEAD Brain: There is no acute infarction or intracranial hemorrhage. There is no intracranial mass, mass effect, or edema. There is no hydrocephalus or extra-axial fluid collection. Prominence of the ventricles and sulci reflects generalized parenchymal volume loss. Patchy T2 hyperintensity in the supratentorial white matter is nonspecific but probably reflects mild to moderate chronic microvascular ischemic changes. There is a small chronic infarct of the left thalamus. No abnormal enhancement. Vascular: Major vessel flow voids at the skull base are preserved. Skull and upper cervical spine: Normal marrow signal is preserved. Sinuses/Orbits: Paranasal sinuses are aerated. Bilateral lens replacements. Other: Sella is unremarkable. Patchy left mastoid fluid opacification. MRA HEAD Intracranial internal carotid arteries are patent. Middle and anterior cerebral arteries  are patent. Intracranial vertebral arteries, basilar artery, posterior cerebral arteries are patent. Left posterior communicating artery is present. There is no significant stenosis or aneurysm. IMPRESSION: No evidence of recent infarction, hemorrhage, or mass. No abnormal enhancement. Mild to moderate chronic microvascular ischemic changes. Small chronic left thalamic infarct. No proximal intracranial vessel occlusion or significant stenosis. Electronically Signed   By: PMacy MisM.D.   On: 09/11/2019 17:48   ECHOCARDIOGRAM COMPLETE BUBBLE STUDY  Result Date: 09/12/2019    ECHOCARDIOGRAM REPORT   Patient Name:   GTAVIOUS GRIESINGERDate of Exam: 09/12/2019 Medical Rec #:  [id number]       Height:       71.0 in Accession #:    [id number]      Weight:       185.4 lb Date of Birth:  [birth year]        BSA:          2.042 m Patient Age:    89years         BP:           143/68 mmHg Patient Gender: M                HR:           88 bpm. Exam Location:  Inpatient Procedure: 2D Echo, Cardiac Doppler, Color Doppler and Saline Contrast Bubble            Study Indications:    TIA 435.9 / G45.9  History:        Patient has no prior history of Echocardiogram examinations.  Sonographer:    TJonelle SidleDance Referring Phys: [id number] 1. Left ventricular ejection fraction, by estimation, is 55 to 60%. The left ventricle has normal function. The left ventricle has no regional wall motion abnormalities. Left ventricular diastolic parameters are consistent with Grade I diastolic dysfunction (impaired relaxation).  2. Right ventricular systolic function is normal. The right ventricular size is normal. There is normal pulmonary artery systolic pressure.  3. Left atrial size was mildly dilated.  4. The mitral valve is normal in structure. No evidence of mitral valve regurgitation. No evidence of mitral stenosis.  5. The aortic valve is normal in structure. Aortic valve regurgitation is not visualized. No aortic  stenosis is present.  6. The inferior vena cava is normal in size with greater than 50% respiratory variability, suggesting right atrial pressure of 3 mmHg.  7. Agitated saline contrast bubble study was negative, with no evidence of any interatrial shunt. FINDINGS  Left Ventricle: Left ventricular ejection fraction, by estimation, is 55 to 60%. The left ventricle has normal function. The left ventricle  has no regional wall motion abnormalities. The left ventricular internal cavity size was normal in size. There is  no left ventricular hypertrophy. Left ventricular diastolic parameters are consistent with Grade I diastolic dysfunction (impaired relaxation). Right Ventricle: The right ventricular size is normal. No increase in right ventricular wall thickness. Right ventricular systolic function is normal. There is normal pulmonary artery systolic pressure. The tricuspid regurgitant velocity is 2.32 m/s, and  with an assumed right atrial pressure of 8 mmHg, the estimated right ventricular systolic pressure is 41.2 mmHg. Left Atrium: Left atrial size was mildly dilated. Right Atrium: Right atrial size was normal in size. Pericardium: There is no evidence of pericardial effusion. Mitral Valve: The mitral valve is normal in structure. Normal mobility of the mitral valve leaflets. No evidence of mitral valve regurgitation. No evidence of mitral valve stenosis. Tricuspid Valve: The tricuspid valve is normal in structure. Tricuspid valve regurgitation is mild . No evidence of tricuspid stenosis. Aortic Valve: The aortic valve is normal in structure. Aortic valve regurgitation is not visualized. No aortic stenosis is present. Pulmonic Valve: The pulmonic valve was normal in structure. Pulmonic valve regurgitation is mild. No evidence of pulmonic stenosis. Aorta: The aortic root is normal in size and structure. Venous: The inferior vena cava is normal in size with greater than 50% respiratory variability, suggesting right  atrial pressure of 3 mmHg. IAS/Shunts: No atrial level shunt detected by color flow Doppler. Agitated saline contrast was given intravenously to evaluate for intracardiac shunting. Agitated saline contrast bubble study was negative, with no evidence of any interatrial shunt.  LEFT VENTRICLE PLAX 2D LVIDd:         4.70 cm  Diastology LVIDs:         3.60 cm  LV e' lateral:   7.29 cm/s LV PW:         1.30 cm  LV E/e' lateral: 5.9 LV IVS:        1.00 cm  LV e' medial:    6.42 cm/s LVOT diam:     2.50 cm  LV E/e' medial:  6.7 LV SV:         71 LV SV Index:   35 LVOT Area:     4.91 cm  RIGHT VENTRICLE             IVC RV Basal diam:  2.60 cm     IVC diam: 2.10 cm RV S prime:     14.60 cm/s TAPSE (M-mode): 2.1 cm LEFT ATRIUM             Index       RIGHT ATRIUM           Index LA diam:        4.00 cm 1.96 cm/m  RA Area:     13.30 cm LA Vol (A2C):   46.4 ml 22.73 ml/m RA Volume:   28.60 ml  14.01 ml/m LA Vol (A4C):   37.3 ml 18.27 ml/m LA Biplane Vol: 42.6 ml 20.86 ml/m  AORTIC VALVE LVOT Vmax:   72.10 cm/s LVOT Vmean:  50.800 cm/s LVOT VTI:    0.144 m  AORTA Ao Root diam: 3.80 cm Ao Asc diam:  3.10 cm MITRAL VALVE               TRICUSPID VALVE MV Area (PHT): 3.31 cm    TR Peak grad:   21.5 mmHg MV Decel Time: 229 msec    TR Vmax:  232.00 cm/s MV E velocity: 43.30 cm/s MV A velocity: 71.70 cm/s  SHUNTS MV E/A ratio:  0.60        Systemic VTI:  0.14 m                            Systemic Diam: 2.50 cm Jenkins Rouge MD Electronically signed by Jenkins Rouge MD Signature Date/Time: 09/12/2019/9:48:20 AM    Final    CT HEAD CODE STROKE WO CONTRAST  Result Date: 09/11/2019 CLINICAL DATA:  Code stroke. EXAM: CT HEAD WITHOUT CONTRAST TECHNIQUE: Contiguous axial images were obtained from the base of the skull through the vertex without intravenous contrast. COMPARISON:  None. FINDINGS: Brain: No acute hemorrhage. Focal hypodensity within the left thalamus. Scattered white matter hypodensity, including evidence in the  left external capsule hypodensity, which may relate to chronic microvascular ischemic disease. Mild to moderate diffuse cerebral atrophy with ex vacuo ventricular dilation. No hydrocephalus. Vascular: No definite hyperdense vessel.  Calcific atherosclerosis. Skull: Normal. Negative for fracture or focal lesion. Sinuses/Orbits: Remote right medial orbital wall fracture. Mucosal thickening involving intra great maxillary sinus. No air-fluid levels. Other: No mastoid effusion. ASPECTS ([place] Stroke Program Early CT Score): 10 IMPRESSION: 1. No acute hemorrhage. No evidence of acute large vascular territory infarct. 2. Focal hypodensity within the left thalamus is age-indeterminant without priors, but favored remote. MRI could further evaluate for acute infarct if clinically indicated. 3. Presumed chronic microvascular ischemic disease. Code stroke imaging results were communicated on 09/11/2019 at 1:40 pm to provider Dr. Annice Pih via telephone, who verbally acknowledged these results. Electronically Signed   By: Margaretha Sheffield MD   On: 09/11/2019 13:46   VAS US CAROTID  Result Date: 09/12/2019 Carotid Arterial Duplex Study Indications:       CVA. Comparison Study:  08/18/15 previous Performing Technologist: Abram Sander RVS  Examination Guidelines: A complete evaluation includes B-mode imaging, spectral Doppler, color Doppler, and power Doppler as needed of all accessible portions of each vessel. Bilateral testing is considered an integral part of a complete examination. Limited examinations for reoccurring indications may be performed as noted.  Right Carotid Findings: +----------+--------+--------+--------+------------------+--------+           PSV cm/sEDV cm/sStenosisPlaque DescriptionComments +----------+--------+--------+--------+------------------+--------+ CCA Prox  58      13              heterogenous               +----------+--------+--------+--------+------------------+--------+ CCA  Distal56      12              heterogenous               +----------+--------+--------+--------+------------------+--------+ ICA Prox  67      21      1-39%   heterogenous               +----------+--------+--------+--------+------------------+--------+ ICA Distal106     30                                         +----------+--------+--------+--------+------------------+--------+ ECA       81      11                                         +----------+--------+--------+--------+------------------+--------+ +----------+--------+-------+--------+-------------------+  PSV cm/sEDV cmsDescribeArm Pressure (mmHG) +----------+--------+-------+--------+-------------------+ [id number]                                         +----------+--------+-------+--------+-------------------+ +---------+--------+--+--------+--+---------+ VertebralPSV cm/s38EDV cm/s11Antegrade +---------+--------+--+--------+--+---------+  Left Carotid Findings: +----------+--------+--------+--------+------------------+--------+           PSV cm/sEDV cm/sStenosisPlaque DescriptionComments +----------+--------+--------+--------+------------------+--------+ CCA Prox  76      11              heterogenous               +----------+--------+--------+--------+------------------+--------+ CCA Distal55      11              heterogenous               +----------+--------+--------+--------+------------------+--------+ ICA Prox  61      20      1-39%   heterogenous               +----------+--------+--------+--------+------------------+--------+ ICA Distal96      32                                         +----------+--------+--------+--------+------------------+--------+ ECA       57      9                                          +----------+--------+--------+--------+------------------+--------+ +----------+--------+--------+--------+-------------------+            PSV cm/sEDV cm/sDescribeArm Pressure (mmHG) +----------+--------+--------+--------+-------------------+ [id number]                                          +----------+--------+--------+--------+-------------------+ +---------+--------+--+--------+--+---------+ VertebralPSV cm/s50EDV cm/s12Antegrade +---------+--------+--+--------+--+---------+   Summary: Right Carotid: Velocities in the right ICA are consistent with a 1-39% stenosis. Left Carotid: Velocities in the left ICA are consistent with a 1-39% stenosis. Vertebrals: Bilateral vertebral arteries demonstrate antegrade flow. *See table(s) above for measurements and observations.  Electronically signed by Antony Contras MD on 09/12/2019 at 12:38:13 PM.    Final     Microbiology: Recent Results (from the past 240 hour(s))  SARS Coronavirus 2 by RT PCR (hospital order, performed in [facility] hospital lab) Nasopharyngeal Nasopharyngeal Swab     Status: None   Collection Time: 09/11/19  2:59 PM   Specimen: Nasopharyngeal Swab  Result Value Ref Range Status   SARS Coronavirus 2 NEGATIVE NEGATIVE Final    Comment: (NOTE) SARS-CoV-2 target nucleic acids are NOT DETECTED.  The SARS-CoV-2 RNA is generally detectable in upper and lower respiratory specimens during the acute phase of infection. The lowest concentration of SARS-CoV-2 viral copies this assay can detect is 250 copies / mL. A negative result does not preclude SARS-CoV-2 infection and should not be used as the sole basis for treatment or other patient management decisions.  A negative result may occur with improper specimen collection / handling, submission of specimen other than nasopharyngeal swab, presence of viral mutation(s) within the areas targeted by this assay, and inadequate number of viral copies (<250 copies / mL). A negative result must be combined with clinical observations, patient history, and epidemiological  information.  Fact Sheet for Patients:    StrictlyIdeas.no  Fact Sheet for Healthcare Providers: BankingDealers.co.za  This test is not yet approved or  cleared by the [place] FDA and has been authorized for detection and/or diagnosis of SARS-CoV-2 by FDA under an Emergency Use Authorization (EUA).  This EUA will remain in effect (meaning this test can be used) for the duration of the COVID-19 declaration under Section 564(b)(1) of the Act, 21 U.S.C. section 360bbb-3(b)(1), unless the authorization is terminated or revoked sooner.  Performed at [facility], [place] [address] [place], [place] [address]      Labs: Basic Metabolic Panel: Recent Labs  Lab 09/11/19 1315 09/11/19 1320  NA 138 139  K 4.1 4.0  CL 102 101  CO2 27  --   GLUCOSE 100* 96  BUN 13 14  CREATININE 1.06 1.00  CALCIUM 9.0  --    Liver Function Tests: Recent Labs  Lab 09/11/19 1315  AST 13*  ALT 10  ALKPHOS 45  BILITOT 1.2  PROT 6.5  ALBUMIN 3.6   No results for input(s): LIPASE, AMYLASE in the last 168 hours. No results for input(s): AMMONIA in the last 168 hours. CBC: Recent Labs  Lab 09/11/19 1315 09/11/19 1320  WBC 7.5  --   NEUTROABS 4.6  --   HGB 11.9* 11.6*  HCT 36.5* 34.0*  MCV 95.3  --   PLT 167  --    Cardiac Enzymes: No results for input(s): CKTOTAL, CKMB, CKMBINDEX, TROPONINI in the last 168 hours. BNP: BNP (last 3 results) No results for input(s): BNP in the last 8760 hours.  ProBNP (last 3 results) No results for input(s): PROBNP in the last 8760 hours.  CBG: No results for input(s): GLUCAP in the last 168 hours.     Signed:  Kayleen Memos, MD [facility] 09/12/2019, 1:03 PM

## 2019-09-12 NOTE — ED Notes (Signed)
Pt sitting up eating lunch. Pt notified he will be discharged soon. Pt instructed to get dressed when he can.

## 2019-09-12 NOTE — ED Notes (Signed)
Admitting at bedside. Daughter notified , placed on speaker with MD and team while speaking with pt.

## 2019-09-12 NOTE — ED Notes (Signed)
Pt ambulatory to restroom with steady gait.

## 2019-09-12 NOTE — ED Notes (Signed)
Pt asleep.

## 2019-09-12 NOTE — ED Notes (Signed)
PT at bedside.

## 2019-09-12 NOTE — Progress Notes (Addendum)
STROKE TEAM PROGRESS NOTE   INTERVAL HISTORY No family at bedside. Discussed POC with daughter via telephone. All questions answered.  Recommend discharged today and f/u I n 4 weeks in clinic.   Vitals:   09/12/19 0615 09/12/19 0630 09/12/19 0818 09/12/19 1209  BP:   (!) 143/68 137/65  Pulse: 78 72 94 95  Resp: 12 (!) 21 (!) 21 20  Temp:      TempSrc:      SpO2: 95% 97% 98% 99%  Weight:      Height:       CBC:  Recent Labs  Lab 09/11/19 1315 09/11/19 1320  WBC 7.5  --   NEUTROABS 4.6  --   HGB 11.9* 11.6*  HCT 36.5* 34.0*  MCV 95.3  --   PLT 167  --    Basic Metabolic Panel:  Recent Labs  Lab 09/11/19 1315 09/11/19 1320  NA 138 139  K 4.1 4.0  CL 102 101  CO2 27  --   GLUCOSE 100* 96  BUN 13 14  CREATININE 1.06 1.00  CALCIUM 9.0  --    Lipid Panel:  Recent Labs  Lab 09/12/19 0257  CHOL 117  TRIG 143  HDL 34*  CHOLHDL 3.4  VLDL 29  LDLCALC 54   HgbA1c:  Recent Labs  Lab 09/12/19 0257  HGBA1C 5.4   Urine Drug Screen:  Recent Labs  Lab 09/11/19 1400  LABOPIA NONE DETECTED  COCAINSCRNUR NONE DETECTED  LABBENZ NONE DETECTED  AMPHETMU NONE DETECTED  THCU NONE DETECTED  LABBARB NONE DETECTED    Alcohol Level  Recent Labs  Lab 09/11/19 1315  ETH <10    IMAGING past 24 hours MR ANGIO HEAD WO CONTRAST  Result Date: 09/11/2019 CLINICAL DATA:  Left facial droop, abnormal speech EXAM: MRI HEAD WITHOUT AND WITH CONTRAST MRA HEAD WITHOUT CONTRAST TECHNIQUE: Multiplanar, multiecho pulse sequences of the brain and surrounding structures were obtained without and with intravenous contrast. Angiographic images of the head were obtained using MRA technique without contrast. CONTRAST:  50m GADAVIST GADOBUTROL 1 MMOL/ML IV SOLN COMPARISON:  None. FINDINGS: MRI HEAD Brain: There is no acute infarction or intracranial hemorrhage. There is no intracranial mass, mass effect, or edema. There is no hydrocephalus or extra-axial fluid collection. Prominence of the  ventricles and sulci reflects generalized parenchymal volume loss. Patchy T2 hyperintensity in the supratentorial white matter is nonspecific but probably reflects mild to moderate chronic microvascular ischemic changes. There is a small chronic infarct of the left thalamus. No abnormal enhancement. Vascular: Major vessel flow voids at the skull base are preserved. Skull and upper cervical spine: Normal marrow signal is preserved. Sinuses/Orbits: Paranasal sinuses are aerated. Bilateral lens replacements. Other: Sella is unremarkable. Patchy left mastoid fluid opacification. MRA HEAD Intracranial internal carotid arteries are patent. Middle and anterior cerebral arteries are patent. Intracranial vertebral arteries, basilar artery, posterior cerebral arteries are patent. Left posterior communicating artery is present. There is no significant stenosis or aneurysm. IMPRESSION: No evidence of recent infarction, hemorrhage, or mass. No abnormal enhancement. Mild to moderate chronic microvascular ischemic changes. Small chronic left thalamic infarct. No proximal intracranial vessel occlusion or significant stenosis. Electronically Signed   By: PMacy MisM.D.   On: 09/11/2019 17:48   MR BRAIN W WO CONTRAST  Result Date: 09/11/2019 CLINICAL DATA:  Left facial droop, abnormal speech EXAM: MRI HEAD WITHOUT AND WITH CONTRAST MRA HEAD WITHOUT CONTRAST TECHNIQUE: Multiplanar, multiecho pulse sequences of the brain and surrounding structures were obtained  without and with intravenous contrast. Angiographic images of the head were obtained using MRA technique without contrast. CONTRAST:  23m GADAVIST GADOBUTROL 1 MMOL/ML IV SOLN COMPARISON:  None. FINDINGS: MRI HEAD Brain: There is no acute infarction or intracranial hemorrhage. There is no intracranial mass, mass effect, or edema. There is no hydrocephalus or extra-axial fluid collection. Prominence of the ventricles and sulci reflects generalized parenchymal volume loss.  Patchy T2 hyperintensity in the supratentorial white matter is nonspecific but probably reflects mild to moderate chronic microvascular ischemic changes. There is a small chronic infarct of the left thalamus. No abnormal enhancement. Vascular: Major vessel flow voids at the skull base are preserved. Skull and upper cervical spine: Normal marrow signal is preserved. Sinuses/Orbits: Paranasal sinuses are aerated. Bilateral lens replacements. Other: Sella is unremarkable. Patchy left mastoid fluid opacification. MRA HEAD Intracranial internal carotid arteries are patent. Middle and anterior cerebral arteries are patent. Intracranial vertebral arteries, basilar artery, posterior cerebral arteries are patent. Left posterior communicating artery is present. There is no significant stenosis or aneurysm. IMPRESSION: No evidence of recent infarction, hemorrhage, or mass. No abnormal enhancement. Mild to moderate chronic microvascular ischemic changes. Small chronic left thalamic infarct. No proximal intracranial vessel occlusion or significant stenosis. Electronically Signed   By: PMacy MisM.D.   On: 09/11/2019 17:48   ECHOCARDIOGRAM COMPLETE BUBBLE STUDY  Result Date: 09/12/2019    ECHOCARDIOGRAM REPORT   Patient Name:   Michael KILLEDate of Exam: 09/12/2019 Medical Rec #:  [id number]       Height:       71.0 in Accession #:    [id number]      Weight:       185.4 lb Date of Birth:  1January 23, 1934        BSA:          2.042 m Patient Age:    89years         BP:           143/68 mmHg Patient Gender: M                HR:           88 bpm. Exam Location:  Inpatient Procedure: 2D Echo, Cardiac Doppler, Color Doppler and Saline Contrast Bubble            Study Indications:    TIA 435.9 / G45.9  History:        Patient has no prior history of Echocardiogram examinations.  Sonographer:    TJonelle SidleDance Referring Phys: [id number] 1. Left ventricular ejection fraction, by estimation, is 55 to 60%. The  left ventricle has normal function. The left ventricle has no regional wall motion abnormalities. Left ventricular diastolic parameters are consistent with Grade I diastolic dysfunction (impaired relaxation).  2. Right ventricular systolic function is normal. The right ventricular size is normal. There is normal pulmonary artery systolic pressure.  3. Left atrial size was mildly dilated.  4. The mitral valve is normal in structure. No evidence of mitral valve regurgitation. No evidence of mitral stenosis.  5. The aortic valve is normal in structure. Aortic valve regurgitation is not visualized. No aortic stenosis is present.  6. The inferior vena cava is normal in size with greater than 50% respiratory variability, suggesting right atrial pressure of 3 mmHg.  7. Agitated saline contrast bubble study was negative, with no evidence of any interatrial shunt. FINDINGS  Left Ventricle: Left ventricular  ejection fraction, by estimation, is 55 to 60%. The left ventricle has normal function. The left ventricle has no regional wall motion abnormalities. The left ventricular internal cavity size was normal in size. There is  no left ventricular hypertrophy. Left ventricular diastolic parameters are consistent with Grade I diastolic dysfunction (impaired relaxation). Right Ventricle: The right ventricular size is normal. No increase in right ventricular wall thickness. Right ventricular systolic function is normal. There is normal pulmonary artery systolic pressure. The tricuspid regurgitant velocity is 2.32 m/s, and  with an assumed right atrial pressure of 8 mmHg, the estimated right ventricular systolic pressure is 20.8 mmHg. Left Atrium: Left atrial size was mildly dilated. Right Atrium: Right atrial size was normal in size. Pericardium: There is no evidence of pericardial effusion. Mitral Valve: The mitral valve is normal in structure. Normal mobility of the mitral valve leaflets. No evidence of mitral valve  regurgitation. No evidence of mitral valve stenosis. Tricuspid Valve: The tricuspid valve is normal in structure. Tricuspid valve regurgitation is mild . No evidence of tricuspid stenosis. Aortic Valve: The aortic valve is normal in structure. Aortic valve regurgitation is not visualized. No aortic stenosis is present. Pulmonic Valve: The pulmonic valve was normal in structure. Pulmonic valve regurgitation is mild. No evidence of pulmonic stenosis. Aorta: The aortic root is normal in size and structure. Venous: The inferior vena cava is normal in size with greater than 50% respiratory variability, suggesting right atrial pressure of 3 mmHg. IAS/Shunts: No atrial level shunt detected by color flow Doppler. Agitated saline contrast was given intravenously to evaluate for intracardiac shunting. Agitated saline contrast bubble study was negative, with no evidence of any interatrial shunt.  LEFT VENTRICLE PLAX 2D LVIDd:         4.70 cm  Diastology LVIDs:         3.60 cm  LV e' lateral:   7.29 cm/s LV PW:         1.30 cm  LV E/e' lateral: 5.9 LV IVS:        1.00 cm  LV e' medial:    6.42 cm/s LVOT diam:     2.50 cm  LV E/e' medial:  6.7 LV SV:         71 LV SV Index:   35 LVOT Area:     4.91 cm  RIGHT VENTRICLE             IVC RV Basal diam:  2.60 cm     IVC diam: 2.10 cm RV S prime:     14.60 cm/s TAPSE (M-mode): 2.1 cm LEFT ATRIUM             Index       RIGHT ATRIUM           Index LA diam:        4.00 cm 1.96 cm/m  RA Area:     13.30 cm LA Vol (A2C):   46.4 ml 22.73 ml/m RA Volume:   28.60 ml  14.01 ml/m LA Vol (A4C):   37.3 ml 18.27 ml/m LA Biplane Vol: 42.6 ml 20.86 ml/m  AORTIC VALVE LVOT Vmax:   72.10 cm/s LVOT Vmean:  50.800 cm/s LVOT VTI:    0.144 m  AORTA Ao Root diam: 3.80 cm Ao Asc diam:  3.10 cm MITRAL VALVE               TRICUSPID VALVE MV Area (PHT): 3.31 cm    TR Peak grad:   21.5  mmHg MV Decel Time: 229 msec    TR Vmax:        232.00 cm/s MV E velocity: 43.30 cm/s MV A velocity: 71.70 cm/s   SHUNTS MV E/A ratio:  0.60        Systemic VTI:  0.14 m                            Systemic Diam: 2.50 cm Jenkins Rouge MD Electronically signed by Jenkins Rouge MD Signature Date/Time: 09/12/2019/9:48:20 AM    Final    VAS US CAROTID  Result Date: 09/12/2019 Carotid Arterial Duplex Study Indications:       CVA. Comparison Study:  08/18/15 previous Performing Technologist: Abram Sander RVS  Examination Guidelines: A complete evaluation includes B-mode imaging, spectral Doppler, color Doppler, and power Doppler as needed of all accessible portions of each vessel. Bilateral testing is considered an integral part of a complete examination. Limited examinations for reoccurring indications may be performed as noted.  Right Carotid Findings: +----------+--------+--------+--------+------------------+--------+           PSV cm/sEDV cm/sStenosisPlaque DescriptionComments +----------+--------+--------+--------+------------------+--------+ CCA Prox  58      13              heterogenous               +----------+--------+--------+--------+------------------+--------+ CCA Distal56      12              heterogenous               +----------+--------+--------+--------+------------------+--------+ ICA Prox  67      21      1-39%   heterogenous               +----------+--------+--------+--------+------------------+--------+ ICA Distal106     30                                         +----------+--------+--------+--------+------------------+--------+ ECA       81      11                                         +----------+--------+--------+--------+------------------+--------+ +----------+--------+-------+--------+-------------------+           PSV cm/sEDV cmsDescribeArm Pressure (mmHG) +----------+--------+-------+--------+-------------------+ [id number]                                         +----------+--------+-------+--------+-------------------+  +---------+--------+--+--------+--+---------+ VertebralPSV cm/s38EDV cm/s11Antegrade +---------+--------+--+--------+--+---------+  Left Carotid Findings: +----------+--------+--------+--------+------------------+--------+           PSV cm/sEDV cm/sStenosisPlaque DescriptionComments +----------+--------+--------+--------+------------------+--------+ CCA Prox  76      11              heterogenous               +----------+--------+--------+--------+------------------+--------+ CCA Distal55      11              heterogenous               +----------+--------+--------+--------+------------------+--------+ ICA Prox  61      20      1-39%   heterogenous               +----------+--------+--------+--------+------------------+--------+  ICA Distal96      32                                         +----------+--------+--------+--------+------------------+--------+ ECA       57      9                                          +----------+--------+--------+--------+------------------+--------+ +----------+--------+--------+--------+-------------------+           PSV cm/sEDV cm/sDescribeArm Pressure (mmHG) +----------+--------+--------+--------+-------------------+ [id number]                                          +----------+--------+--------+--------+-------------------+ +---------+--------+--+--------+--+---------+ VertebralPSV cm/s50EDV cm/s12Antegrade +---------+--------+--+--------+--+---------+   Summary: Right Carotid: Velocities in the right ICA are consistent with a 1-39% stenosis. Left Carotid: Velocities in the left ICA are consistent with a 1-39% stenosis. Vertebrals: Bilateral vertebral arteries demonstrate antegrade flow. *See table(s) above for measurements and observations.  Electronically signed by Antony Contras MD on 09/12/2019 at 12:38:13 PM.    Final     PHYSICAL EXAM  Frail elderly Caucasian male not in distress. . Afebrile. Head is  nontraumatic. Neck is supple without bruit.    Cardiac exam no murmur or gallop. Lungs are clear to auscultation. Distal pulses are well felt. . Afebrile. Head is nontraumatic. Neck is supple without bruit.    Cardiac exam no murmur or gallop. Lungs are clear to auscultation. Distal pulses are well felt. Neurological Exam ;  Awake  Alert oriented x 3. Normal speech and language.eye movements full without nystagmus.fundi were not visualized. Vision acuity and fields appear normal. Hearing is normal. Palatal movements are normal. Face symmetric. Tongue midline. Normal strength, tone, reflexes and coordination. Normal sensation. Gait deferred. ASSESSMENT/PLAN Mr. Michael [name] is a 86 y.o. male  with PMH significant for colon polyps, crohn's dz who presents with acute onset L Facial droop and garbled speech with a LKW of 1115AM on 09/11/19. By the time, he got to the hospital, his only deficit was L facial droop. Right brain subcortical TIA: Likely from small vessel disease  Code Stroke 1. No acute hemorrhage. No evidence of acute large vascular territory infarct. 2. Focal hypodensity within the left thalamus is age-indeterminant without priors, but favored remote. MRI could further evaluate for acute infarct if clinically indicated.  MRI  No  Acute infarction, small chronic left thalamic   MRA  No LVO  Carotid Doppler  Right Carotid: Velocities in the right ICA are consistent with a 1-39%  stenosis.   Left Carotid: Velocities in the left ICA are consistent with a 1-39%  stenosis.   Vertebrals: Bilateral vertebral arteries demonstrate antegrade flow.  2D Echo 1. Left ventricular ejection fraction, by estimation, is 55 to 60%. The  left ventricle has normal function. The left ventricle has no regional  wall motion abnormalities. Left ventricular diastolic parameters are  consistent with Grade I diastolic  dysfunction (impaired relaxation).  2. Right ventricular systolic function is  normal. The right ventricular  size is normal. There is normal pulmonary artery systolic pressure.  3. Left atrial size was mildly dilated.  4. The mitral valve is normal in structure. No evidence of mitral  valve  regurgitation. No evidence of mitral stenosis.  5. The aortic valve is normal in structure. Aortic valve regurgitation is  not visualized. No aortic stenosis is present.  6. The inferior vena cava is normal in size with greater than 50%  respiratory variability, suggesting right atrial pressure of 3 mmHg.  7. Agitated saline contrast bubble study was negative, with no evidence  of any interatrial shunt.   LDL 54  HgbA1c 5.4  VTE prophylaxis - lovenox    Diet   Diet regular Room service appropriate? Yes; Fluid consistency: Thin     No antithrombotic prior to admission, now on aspirin 81 mg daily and clopidogrel 75 mg daily. Continue ASA and plavix for 3 weeks and then just ASA alone.   Therapy recommendations:  No f/u recommendations  Disposition:  home  Hypertension  Home meds:  none  Stable . Permissive hypertension (OK if < 220/120) but gradually normalize in 5-7 days . Long-term BP goal normotensive  Hyperlipidemia  Home meds:  none  LDL 54, goal < 70    Diabetes type II Controlled  Home meds:  none  HgbA1c 5.4, goal < 7.0   Other Stroke Risk Factors  Advanced age  ETOH use, alcohol level <10, advised to drink no more than 2 drink(s) a day  Obesity, Body mass index is 25.86 kg/m., recommend weight loss, diet and exercise as appropriate    Hospital day # 0    Laurey Morale, MSN, NP-C [facility] [phone]  I have personally obtained history,examined this patient, reviewed notes, independently viewed imaging studies, participated in medical decision making and plan of care.ROS completed by me personally and pertinent positives fully documented  I have made any additions or clarifications directly to the above note.  Agree with note above.Marland Kitchen He presented with transient slurred speech and left facial droop lasting 30 minutes which resolved completely and MRI is negative for acute stroke.  Etiology likely small vessel disease.  Recommend aspirin and Plavix for 3 weeks followed by aspirin alone.  Aggressive risk factor modification.  Long discussion patient and daughter and answered questions.  Patient was given opportunity yesterday to participate in the BMS enzymatic stroke prevention trial but declined.  Discussed with Dr. [place] Crane.  Greater than 50% time during this 25-minute visit was spent on counseling and coordination of care about TIA and stroke prevention and answering questions  Antony Contras, MD Medical Director [place] Pager: [phone] 09/12/2019 3:33 PM  To contact Stroke Continuity provider, please refer to [email or web address] After hours, contact General Neurology

## 2019-09-13 ENCOUNTER — Telehealth: Payer: Self-pay | Admitting: Neurology

## 2019-09-13 MED ORDER — CLOPIDOGREL BISULFATE 75 MG PO TABS: 75.0000 mg | ORAL_TABLET | Freq: Every day | ORAL | 11 refills | Status: AC

## 2019-09-13 NOTE — Telephone Encounter (Signed)
Our team forgot to order 3 weeks of Plavix. I sent a script for Plavix 21 days to [facility], [place].  [place] Pager Number [phone]

## 2019-09-17 NOTE — Progress Notes (Signed)
Subjective:    Patient ID: Michael [name], male    DOB: Dec 17, 1932, 86 y.o.   MRN: [id number]  HPI The patient is here for follow up from the hospital.  His wife is here with him.   ED 9/1-9/2  - he went for stroke symptoms  He was seen normal around 1115.  Around noon his wife noticed he had right facial droop and abnormal speech - sounded garbled.  His symptoms rapidly improved by the time he reached the ED.  No weakness, headache. Initially had right mild facial droop which resolved upon returning from CT. Remainder of neuro exam was normal.   Dx with TIA.  No TPA.  MRI ordered.  [place] of carotids show 1-39% stenosis.  MRI/A of head: no acute infarct,  H/o of stroke. Chronic microvascular dz.  Echo showed normal EF, grade 1 DD. Mild LAE. Normal valves.   He was started on lipitor 40 mg, but this was later stopped after neurology reviewed his last lipid panel.  He was discharged on ASA 81 mg daily and plavix daily x 21 days.       The evening he was discharged he had confusion. No facial droop or weakness.  Confusion with answering questions - answered questions with same answer.  His wife discussed this with neuro over the phone - see phone note.  He could not answer mothers name, tell time on clock.  Had some amnesia.    No issues since then.  He is taking all of his medications as prescribed.      Medications and allergies reviewed with patient and updated if appropriate.  Patient Active Problem List   Diagnosis Date Noted  . TIA (transient ischemic attack) 09/11/2019  . History of colonic polyps   . Benign neoplasm of colon   . Ingrown toenail of right foot with infection 06/06/2019  . Earache symptoms, bilateral 06/03/2019  . Bilateral impacted cerumen 05/25/2019  . Overactive bladder 05/06/2019  . Memory changes 05/06/2019  . Polyp of large intestine   . Asymptomatic varicose veins of left lower extremity 12/16/2017  . Hyperglycemia 11/02/2017  . 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 Clavicle fracture 11/02/2017  . Vitamin D deficiency 11/02/2017  . History of DVT of lower extremity, left leg 07/2017 08/08/2017  . SBO (small bowel obstruction) ([place]) 07/05/2017  . Bilateral hearing loss 05/31/2017  . B12 deficiency 02/06/2017  . Osteopenia 02/06/2017  . Glaucoma 12/12/2015  . Crohn's disease ([place]) 12/12/2015  . Anemia 12/12/2015  . 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 acetaminophen (TYLENOL) 500 MG tablet Take 500-1,000 mg by mouth every 8 (eight) hours as needed for mild pain or headache.     . Ascorbic Acid (VITAMIN C) 1000 MG tablet Take 1,000 mg by mouth daily with breakfast.     . aspirin 81 MG chewable tablet Chew 1 tablet (81 mg total) by mouth daily. 90 tablet 0  . 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     . brimonidine (ALPHAGAN) 0.2 % ophthalmic solution Place 1 drop into the left eye See admin instructions. Instill 1 drop into the left eye twice a day- 6:45 AM and 5:50 PM    . Calcium Carbonate-Vit D-Min (CALCIUM 1200 PO) Take 1,200 mg by mouth daily with breakfast.    . Cholecalciferol (VITAMIN D3)  50 MCG (2000 UT) TABS Take 2,000 Units by mouth daily with breakfast.    . clopidogrel (PLAVIX) 75 MG tablet Take 1 tablet (75 mg total) by mouth daily for 21 days. 30 tablet 11  . cyanocobalamin (,VITAMIN B-12,) 1000 MCG/ML injection INJECT 1 ML INTO THE MUSCLE EVERY 30 DAYS (Patient taking differently: Inject 1,000 mcg into the skin every 30 (thirty) days. ) 10 mL 0  . dorzolamide-timolol (COSOPT) 22.3-6.8 MG/ML ophthalmic solution Place 1 drop into the left eye See admin instructions. Instill 1 drop into the left eye twice a day- 6:35 AM and 5:30 PM    . ferrous sulfate 325 (65 FE) MG tablet Take 325 mg by mouth every Monday, Wednesday, and Friday.     .  latanoprost (XALATAN) 0.005 % ophthalmic solution Place 1 drop into the left eye See admin instructions. Instill 1 drop into the left eye at 6:10 PM daily    . loperamide (IMODIUM A-D) 2 MG tablet Take 2 mg by mouth See admin instructions. Take 2 mg by mouth one to two times a day as needed/as directed    . Netarsudil Dimesylate (RHOPRESSA) 0.02 % SOLN Place 1 drop into the left eye See admin instructions. Instill 1 drop into the left eye at 6:15 PM daily    . omeprazole (PRILOSEC OTC) 20 MG tablet Take 20 mg by mouth daily as needed (for reflux symptoms).    . prednisoLONE acetate (PRED FORTE) 1 % ophthalmic suspension Place 1 drop into the right eye See admin instructions. Instill 1 drop into the right eye four times a day- 7:00 AM, 12:00 noon, 4:00 PM, and 6:00 PM     No current facility-administered medications on file prior to visit.    Past Medical History:  Diagnosis Date  . Adrenal insufficiency ([place])   . Anemia   . Avascular necrosis ([place])   . Crohn's disease ([place])   . Glaucoma   . Inguinal hernia recurrent bilateral   . Low testosterone   . Osteopenia   . Retinal ischemia     Past Surgical History:  Procedure Laterality Date  . ABDOMINAL SURGERY    . COLONOSCOPY N/A 12/13/2015   Procedure: COLONOSCOPY;  Surgeon: Laurence Spates, MD;  Location: [facility];  Service: Endoscopy;  Laterality: N/A;  . COLONOSCOPY WITH PROPOFOL N/A 03/25/2019   Procedure: COLONOSCOPY WITH PROPOFOL;  Surgeon: Yetta Flock, MD;  [facility];  Service: Gastroenterology;  Laterality: N/A;  . COLONOSCOPY WITH PROPOFOL N/A 08/26/2019   Procedure: COLONOSCOPY WITH PROPOFOL;  Surgeon: Yetta Flock, MD;  [facility];  Service: Gastroenterology;  Laterality: N/A;  . ENDOSCOPIC MUCOSAL RESECTION N/A 08/26/2019   Procedure: ENDOSCOPIC MUCOSAL RESECTION;  Surgeon: Yetta Flock, MD;  [facility];  Service: Gastroenterology;  Laterality: N/A;  . INGUINAL  HERNIA REPAIR Bilateral 05/21/2014   Procedure: OPEN BILATERAL INGUINAL HERNIA REPAIRS WITH MESH;  Surgeon: Donnie Mesa, MD;  Location: [facility];  Service: General;  Laterality: Bilateral;  . POLYPECTOMY  03/25/2019   Procedure: POLYPECTOMY;  Surgeon: Yetta Flock, MD;  [facility];  Service: Gastroenterology;;  . POLYPECTOMY  08/26/2019   Procedure: POLYPECTOMY;  Surgeon: Yetta Flock, MD;  [facility];  Service: Gastroenterology;;  . Woodward Ku  08/26/2019   Procedure: Sprayed methylene blue;  Surgeon: Yetta Flock, MD;  [facility];  Service: Gastroenterology;;  . SMALL INTESTINE SURGERY  1968, 2001    related to Chrohn's disease  .  TONSILLECTOMY      Social History   Socioeconomic History  . Marital status: Married    Spouse name: Not on file  . Number of children: 3  . Years of education: Not on file  . Highest education level: Not on file  Occupational History  . Occupation: Retired  Tobacco Use  . Smoking status: Former Smoker    Quit date: 04/01/1976    Years since quitting: 43.4  . Smokeless tobacco: Never Used  Vaping Use  . Vaping Use: Never used  Substance and Sexual Activity  . Alcohol use: Yes    Comment: 3-4  glass wine  weekly  . Drug use: No  . Sexual activity: Never  Other Topics Concern  . Not on file  Social History Narrative  . Not on file   Social Determinants of Health   Financial Resource Strain:   . Difficulty of Paying Living Expenses: Not on file  Food Insecurity:   . Worried About Charity fundraiser in the Last Year: Not on file  . Ran Out of Food in the Last Year: Not on file  Transportation Needs:   . Lack of Transportation (Medical): Not on file  . Lack of Transportation (Non-Medical): Not on file  Physical Activity:   . Days of Exercise per Week: Not on file  . Minutes of Exercise per Session: Not on file  Stress:   . Feeling of Stress : Not on file   Social Connections:   . Frequency of Communication with Friends and Family: Not on file  . Frequency of Social Gatherings with Friends and Family: Not on file  . Attends Religious Services: Not on file  . Active Member of Clubs or Organizations: Not on file  . Attends Archivist Meetings: Not on file  . Marital Status: Not on file    Family History  Problem Relation Age of Onset  . Deep vein thrombosis Mother   . Heart disease Father   . Stomach cancer Neg Hx   . Colon cancer Neg Hx   . Esophageal cancer Neg Hx   . Pancreatic cancer Neg Hx   . Rectal cancer Neg Hx   . Colon polyps Neg Hx     Review of Systems  Eyes: Negative for visual disturbance.  Respiratory: Negative for shortness of breath.   Cardiovascular: Negative for chest pain, palpitations and leg swelling.  Neurological: Negative for dizziness, speech difficulty, weakness, light-headedness, numbness and headaches.  Psychiatric/Behavioral: Negative for confusion.       Objective:   Vitals:   09/18/19 0951  BP: 132/78  Pulse: 78  Temp: 97.9 F (36.6 C)  SpO2: 99%   BP Readings from Last 3 Encounters:  09/18/19 132/78  09/12/19 132/67  08/26/19 (!) 179/76   Wt Readings from Last 3 Encounters:  09/18/19 180 lb (81.6 kg)  09/11/19 185 lb 6.5 oz (84.1 kg)  08/26/19 182 lb 15.7 oz (83 kg)   Body mass index is 25.1 kg/m.   Physical Exam    Constitutional: Appears well-developed and well-nourished. No distress.  HENT:  Head: Normocephalic and atraumatic.  Neck: Neck supple. No tracheal deviation present. No thyromegaly present.  No cervical lymphadenopathy Cardiovascular: Normal rate, regular rhythm and normal heart sounds.  No murmur heard. No carotid bruit .  No edema Pulmonary/Chest: Effort normal and breath sounds normal. No respiratory distress. No has no wheezes. No rales.  Skin: Skin is warm and dry. Not diaphoretic.  Psychiatric: Normal mood  and affect. Behavior is normal.       Assessment & Plan:   20 minutes were spent face-to-face with the patient and his wife reviewing hospital course including blood work and tests.  Discussed treatment. Spent time coordinating care.     See Problem List for Assessment and Plan of chronic medical problems.    This visit occurred during the SARS-CoV-2 public health emergency.  Safety protocols were in place, including screening questions prior to the visit, additional usage of staff PPE, and extensive cleaning of exam room while observing appropriate contact time as indicated for disinfecting solutions.

## 2019-09-18 ENCOUNTER — Ambulatory Visit (INDEPENDENT_AMBULATORY_CARE_PROVIDER_SITE_OTHER): Payer: Medicare Other | Admitting: Internal Medicine

## 2019-09-18 ENCOUNTER — Encounter: Payer: Self-pay | Admitting: Internal Medicine

## 2019-09-18 ENCOUNTER — Other Ambulatory Visit: Payer: Self-pay

## 2019-09-18 ENCOUNTER — Telehealth: Payer: Self-pay | Admitting: Cardiovascular Disease

## 2019-09-18 VITALS — BP 132/78 | HR 78 | Temp 97.9°F | Wt 180.0 lb

## 2019-09-18 DIAGNOSIS — G459 Transient cerebral ischemic attack, unspecified: Secondary | ICD-10-CM

## 2019-09-18 NOTE — Assessment & Plan Note (Signed)
Acute W/u in hospital showed mild carotid artery stenosis, Echo looked good, MRI/A showed prior infarct, mild-moderate chronic small vessel dz On ASA, and plavix x 21 days then stop Not on statin per neurologist in hospital due to low lipids  ? Intermittent Afib - will refer to cardio to see if they feel a holter would be beneficial  Sees neuro next month

## 2019-09-18 NOTE — Patient Instructions (Addendum)
Medications reviewed and updated.  Changes include :   none    A referral was ordered for Cardiology - Dr Burt Knack.   Someone from their office will call you to schedule an appointment.     Please followup in 3 months

## 2019-09-19 DIAGNOSIS — H401133 Primary open-angle glaucoma, bilateral, severe stage: Secondary | ICD-10-CM | POA: Diagnosis not present

## 2019-10-10 NOTE — Progress Notes (Signed)
[facility]    Provider:  Dr Jaynee Eagles Requesting Provider: Binnie Rail, MD Primary Care Provider:  Binnie Rail, MD  CC:  Memory concerns  HPI:  Michael [name] is a 86 y.o. male here as requested by Binnie Rail, MD for memory ocncerns. PMHx Crohn's disease, TIA, SBO, hearing loss, osteopenia, B12 deficiency. Here with wife who provides most information. 6-8 months ago he became very confused about how to turn the phone on and off. He was confused with the remote. He called wife and asked her to come home. They would find his cell phone and couldn't remember. He was easily frustrated during that time. He did not have a UTI. No outward sign of a stroke. Thought it may be time to see someone about his memory. She thinks the Covid isolation "did him in" per wife, he is very social and he needed more. He has had other signs of dementia along the way. Has been better since he has been getting out but still notciing some confusion and short-term memory loss. He performs all his own ADLs, he is a huge sports nut, he is walking and playing tennis. He takes his own medications. Wife handles all the financial things. He knows what time and what TV shows are coming on. But the incident on 9/1 was different when he had the stroke. His mother died at 9 and had multiple strokes. He was also weaning off of budesonide. No other focal neurologic deficits, associated symptoms, inciting events or modifiable factors.   Reviewed notes, labs and imaging from outside physicians, which showed:  MRI brain 09/11/2019: Mild to moderate chronic microvascular ischemic changes. Small chronic left thalamic infarct. Personally reviewed images and agree. Reviewed with patient and wife as well.  hgba1c 5.4, ldl 60  Review of Systems: Patient complains of symptoms per HPI as well as the following symptoms memory loss, confusion. Pertinent negatives and positives per HPI. All others negative.   Social  History   Socioeconomic History  . Marital status: Married    Spouse name: Not on file  . Number of children: 3  . Years of education: Not on file  . Highest education level: Not on file  Occupational History  . Occupation: Retired  Tobacco Use  . Smoking status: Former Smoker    Quit date: 04/01/1976    Years since quitting: 43.5  . Smokeless tobacco: Never Used  Vaping Use  . Vaping Use: Never used  Substance and Sexual Activity  . Alcohol use: Yes    Comment: 3-4  glass wine  weekly  . Drug use: No  . Sexual activity: Never  Other Topics Concern  . Not on file  Social History Narrative  . Not on file   Social Determinants of Health   Financial Resource Strain:   . Difficulty of Paying Living Expenses: Not on file  Food Insecurity:   . Worried About Charity fundraiser in the Last Year: Not on file  . Ran Out of Food in the Last Year: Not on file  Transportation Needs:   . Lack of Transportation (Medical): Not on file  . Lack of Transportation (Non-Medical): Not on file  Physical Activity:   . Days of Exercise per Week: Not on file  . Minutes of Exercise per Session: Not on file  Stress:   . Feeling of Stress : Not on file  Social Connections:   . Frequency of Communication with Friends and Family: Not on  file  . Frequency of Social Gatherings with Friends and Family: Not on file  . Attends Religious Services: Not on file  . Active Member of Clubs or Organizations: Not on file  . Attends Archivist Meetings: Not on file  . Marital Status: Not on file  Intimate Partner Violence:   . Fear of Current or Ex-Partner: Not on file  . Emotionally Abused: Not on file  . Physically Abused: Not on file  . Sexually Abused: Not on file    Family History  Problem Relation Age of Onset  . Deep vein thrombosis Mother   . Heart disease Father   . Stomach cancer Neg Hx   . Colon cancer Neg Hx   . Esophageal cancer Neg Hx   . Pancreatic cancer Neg Hx   . Rectal  cancer Neg Hx   . Colon polyps Neg Hx     Past Medical History:  Diagnosis Date  . Adrenal insufficiency ([place])   . Anemia   . Avascular necrosis ([place])   . Crohn's disease ([place])   . Glaucoma   . Inguinal hernia recurrent bilateral   . Low testosterone   . Osteopenia   . Retinal ischemia     Patient Active Problem List   Diagnosis Date Noted  . TIA (transient ischemic attack) 09/11/2019  . History of colonic polyps   . Benign neoplasm of colon   . Overactive bladder 05/06/2019  . Memory changes 05/06/2019  . Polyp of large intestine   . Asymptomatic varicose veins of left lower extremity 12/16/2017  . Hyperglycemia 11/02/2017  . 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 Clavicle fracture 11/02/2017  . Vitamin D deficiency 11/02/2017  . History of DVT of lower extremity, left leg 07/2017 08/08/2017  . SBO (small bowel obstruction) ([place]) 07/05/2017  . Bilateral hearing loss 05/31/2017  . B12 deficiency 02/06/2017  . Osteopenia 02/06/2017  . Glaucoma 12/12/2015  . Crohn's disease ([place]) 12/12/2015  . Anemia 12/12/2015  . 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 ABDOMINAL SURGERY    . COLONOSCOPY N/A 12/13/2015   Procedure: COLONOSCOPY;  Surgeon: Laurence Spates, MD;  [facility];  Service: Endoscopy;  Laterality: N/A;  . COLONOSCOPY WITH PROPOFOL N/A 03/25/2019   Procedure: COLONOSCOPY WITH PROPOFOL;  Surgeon: Yetta Flock, MD;  [facility];  Service: Gastroenterology;  Laterality: N/A;  . COLONOSCOPY WITH PROPOFOL N/A 08/26/2019   Procedure: COLONOSCOPY WITH PROPOFOL;  Surgeon: Yetta Flock, MD;  [facility];  Service: Gastroenterology;  Laterality: N/A;  . ENDOSCOPIC MUCOSAL RESECTION N/A 08/26/2019   Procedure: ENDOSCOPIC MUCOSAL RESECTION;  Surgeon: Yetta Flock, MD;  [facility];  Service: Gastroenterology;  Laterality: N/A;  . INGUINAL HERNIA REPAIR Bilateral 05/21/2014    Procedure: OPEN BILATERAL INGUINAL HERNIA REPAIRS WITH MESH;  Surgeon: Donnie Mesa, MD;  Location: [facility];  Service: General;  Laterality: Bilateral;  . POLYPECTOMY  03/25/2019   Procedure: POLYPECTOMY;  Surgeon: Yetta Flock, MD;  [facility];  Service: Gastroenterology;;  . POLYPECTOMY  08/26/2019   Procedure: POLYPECTOMY;  Surgeon: Yetta Flock, MD;  [facility];  Service: Gastroenterology;;  . Woodward Ku  08/26/2019   Procedure: Sprayed methylene blue;  Surgeon: Yetta Flock, MD;  [facility];  Service: Gastroenterology;;  . SMALL INTESTINE SURGERY  1968, 2001    related to Chrohn's disease  . TONSILLECTOMY      Current Outpatient Medications  Medication Sig Dispense Refill  . acetaminophen (TYLENOL) 500 MG tablet Take 500-1,000 mg by mouth every 8 (eight) hours as needed for mild pain or headache.     . Ascorbic Acid (VITAMIN C) 1000 MG tablet Take 1,000 mg by mouth daily with breakfast.     . aspirin 81 MG chewable tablet Chew 1 tablet (81 mg total) by mouth daily. 90 tablet 0  . 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     . brimonidine (ALPHAGAN) 0.2 % ophthalmic solution Place 1 drop into the left eye See admin instructions. Instill 1 drop into the left eye twice a day- 6:45 AM and 5:50 PM    . Calcium Carbonate-Vit D-Min (CALCIUM 1200 PO) Take 1,200 mg by mouth daily with breakfast.    . Cholecalciferol (VITAMIN D3) 50 MCG (2000 UT) TABS Take 2,000 Units by mouth daily with breakfast.    . cyanocobalamin (,VITAMIN B-12,) 1000 MCG/ML injection INJECT 1 ML INTO THE MUSCLE EVERY 30 DAYS (Patient taking differently: Inject 1,000 mcg into the skin every 30 (thirty) days. ) 10 mL 0  . dorzolamide-timolol (COSOPT) 22.3-6.8 MG/ML ophthalmic solution Place 1 drop into the left eye See admin instructions. Instill 1  drop into the left eye twice a day- 6:35 AM and 5:30 PM    . ferrous sulfate 325 (65 FE) MG tablet Take 325 mg by mouth every Monday, Wednesday, and Friday.     . latanoprost (XALATAN) 0.005 % ophthalmic solution Place 1 drop into the left eye See admin instructions. Instill 1 drop into the left eye at 6:10 PM daily    . loperamide (IMODIUM A-D) 2 MG tablet Take 2 mg by mouth See admin instructions. Take 2 mg by mouth one to two times a day as needed/as directed    . Netarsudil Dimesylate (RHOPRESSA) 0.02 % SOLN Place 1 drop into the left eye See admin instructions. Instill 1 drop into the left eye at 6:15 PM daily    . omeprazole (PRILOSEC OTC) 20 MG tablet Take 20 mg by mouth daily as needed (for reflux symptoms).    . prednisoLONE acetate (PRED FORTE) 1 % ophthalmic suspension Place 1 drop into the right eye See admin instructions. Instill 1 drop into the right eye four times a day- 7:00 AM, 12:00 noon, 4:00 PM, and 6:00 PM     No current facility-administered medications for this visit.    Allergies as of 10/11/2019  . (No Known Allergies)    Vitals: BP 108/69   Pulse 77   Ht 5' 11"  (1.803 m)   Wt 180 lb (81.6 kg)   BMI 25.10 kg/m  Last Weight:  Wt Readings from Last 1 Encounters:  10/11/19 180 lb (81.6 kg)   Last Height:   Ht Readings from Last 1 Encounters:  10/11/19 5' 11"  (1.803 m)     Physical exam: Exam: Gen: NAD, conversant, well nourised, obese, well groomed                     CV: RRR, no MRG. No Carotid Bruits. No peripheral edema, warm, nontender Eyes: Conjunctivae clear without exudates or hemorrhage  Neuro: Detailed Neurologic Exam  Speech:    Speech is normal; fluent and spontaneous with normal comprehension.  Cognition:  MMSE - Mini Mental State Exam 10/11/2019  Orientation to time 5  Orientation to Place 5  Registration 3  Attention/ Calculation 4  Recall 3  Language- name 2 objects 2  Language- repeat 1  Language- follow 3 step command 3   Language- read & follow direction 1  Write a sentence 1  Copy design 1  Total score 29   Cranial Nerves:    The pupils are equal, round, and reactive to light. The fundi are nflat. Visual fields are full to finger confrontation. Extraocular movements are intact. Trigeminal sensation is intact and the muscles of mastication are normal. The face is symmetric. The palate elevates in the midline. Hearing intact. Voice is normal. Shoulder shrug is normal. The tongue has normal motion without fasciculations.   Coordination:    No dysmetria or ataxia   Gait:    Normal native gait  Motor Observation:    No asymmetry, no atrophy, and no involuntary movements noted. Tone:    Normal muscle tone.    Posture:    Posture is normal. normal erect    Strength:    Strength is V/V in the upper and lower limbs.      Sensation: intact to LT     Reflex Exam:  DTR's:    Deep tendon reflexes in the upper and lower extremities are symmetrical bilaterally.   Toes:    The toes are downgoing bilaterally.   Clonus:    Clonus is absent.    Assessment/Plan:  86 year old male with cognitive decline, episodes of confusion such as with using the remote. MRI brain with mild to moderate white matter changes, remote thalamic stroke. Despite MMSE 29/30, concerned for prodromal Alzheimer's disease. Will also check EEG due to several episodes of discreet confusion like not understanding how to operate the remote. Progressive, slowly. He takes B12 shots but she notices confusion right before next one is due, B12 shot due tomorrow we will check and see what the levels are.    Discuss with cardiology and gastroenterology about starting Aricept (Donepezil) EEG Wife provides all information, suspect cognitive problems Orders Placed This Encounter  Procedures  . B12 and Folate Panel  . Methylmalonic acid, serum  . Ambulatory referral to Neuropsychology  . EEG   No orders of the defined types were placed in  this encounter.   Cc: Binnie Rail, MD,  Binnie Rail, MD  Sarina Ill, MD  [facility] [address] [place], [place] [address]  Phone [phone] Fax [phone]

## 2019-10-11 ENCOUNTER — Other Ambulatory Visit: Payer: Self-pay

## 2019-10-11 ENCOUNTER — Encounter: Payer: Self-pay | Admitting: Neurology

## 2019-10-11 ENCOUNTER — Ambulatory Visit (INDEPENDENT_AMBULATORY_CARE_PROVIDER_SITE_OTHER): Payer: Medicare Other | Admitting: Neurology

## 2019-10-11 VITALS — BP 108/69 | HR 77 | Ht 71.0 in | Wt 180.0 lb

## 2019-10-11 DIAGNOSIS — R410 Disorientation, unspecified: Secondary | ICD-10-CM

## 2019-10-11 DIAGNOSIS — E538 Deficiency of other specified B group vitamins: Secondary | ICD-10-CM

## 2019-10-11 DIAGNOSIS — R413 Other amnesia: Secondary | ICD-10-CM

## 2019-10-11 NOTE — Patient Instructions (Addendum)
Formal memory testing Blood work today Discuss with cardiology and gastroenterology about starting Aricept (Donepezil)  Memory Compensation Strategies  1. Use "WARM" strategy.  W= write it down  A= associate it  R= repeat it  M= make a mental note  2.   You can keep a Social worker.  Use a 3-ring notebook with sections for the following: calendar, important names and phone numbers,  medications, doctors' names/phone numbers, lists/reminders, and a section to journal what you did  each day.   3.    Use a calendar to write appointments down.  4.    Write yourself a schedule for the day.  This can be placed on the calendar or in a separate section of the Memory Notebook.  Keeping a  regular schedule can help memory.  5.    Use medication organizer with sections for each day or morning/evening pills.  You may need help loading it  6.    Keep a basket, or pegboard by the door.  Place items that you need to take out with you in the basket or on the pegboard.  You may also want to  include a message board for reminders.  7.    Use sticky notes.  Place sticky notes with reminders in a place where the task is performed.  For example: " turn off the  stove" placed by the stove, "lock the door" placed on the door at eye level, " take your medications" on  the bathroom mirror or by the place where you normally take your medications.  8.    Use alarms/timers.  Use while cooking to remind yourself to check on food or as a reminder to take your medicine, or as a  reminder to make a call, or as a reminder to perform another task, etc.

## 2019-10-14 ENCOUNTER — Encounter: Payer: Self-pay | Admitting: Neurology

## 2019-10-15 ENCOUNTER — Encounter: Payer: Self-pay | Admitting: Podiatry

## 2019-10-15 ENCOUNTER — Ambulatory Visit (INDEPENDENT_AMBULATORY_CARE_PROVIDER_SITE_OTHER): Payer: Medicare Other | Admitting: Podiatry

## 2019-10-15 ENCOUNTER — Ambulatory Visit (INDEPENDENT_AMBULATORY_CARE_PROVIDER_SITE_OTHER): Payer: Medicare Other

## 2019-10-15 ENCOUNTER — Other Ambulatory Visit: Payer: Self-pay

## 2019-10-15 DIAGNOSIS — M2011 Hallux valgus (acquired), right foot: Secondary | ICD-10-CM

## 2019-10-15 DIAGNOSIS — M7751 Other enthesopathy of right foot: Secondary | ICD-10-CM | POA: Diagnosis not present

## 2019-10-15 DIAGNOSIS — Q828 Other specified congenital malformations of skin: Secondary | ICD-10-CM | POA: Diagnosis not present

## 2019-10-15 NOTE — Progress Notes (Signed)
Subjective:  Patient ID: Michael [name], male    DOB: [birth year],  MRN: [id number] HPI Chief Complaint  Patient presents with  . Callouses    Sub 5th MPJ right - callused area, sore with walking, active in tennis and doesn't want any treatment that will make this area more sore until tennis is over on Oct. 27th  . Toe Pain    3rd toe left - not sore, skin peeling   . New Patient (Initial Visit)    86 y.o. male presents with the above complaint.   ROS: Denies fever chills nausea vomiting muscle aches pains calf pain back pain chest pain shortness of breath.  Past Medical History:  Diagnosis Date  . Adrenal insufficiency ([place])   . Anemia   . Avascular necrosis ([place])   . Crohn's disease ([place])   . Glaucoma   . Inguinal hernia recurrent bilateral   . Low testosterone   . Osteopenia   . Retinal ischemia    Past Surgical History:  Procedure Laterality Date  . ABDOMINAL SURGERY    . COLONOSCOPY N/A 12/13/2015   Procedure: COLONOSCOPY;  Surgeon: Laurence Spates, MD;  Location: [facility];  Service: Endoscopy;  Laterality: N/A;  . COLONOSCOPY WITH PROPOFOL N/A 03/25/2019   Procedure: COLONOSCOPY WITH PROPOFOL;  Surgeon: Yetta Flock, MD;  Location: [facility];  Service: Gastroenterology;  Laterality: N/A;  . COLONOSCOPY WITH PROPOFOL N/A 08/26/2019   Procedure: COLONOSCOPY WITH PROPOFOL;  Surgeon: Yetta Flock, MD;  Location: [facility];  Service: Gastroenterology;  Laterality: N/A;  . ENDOSCOPIC MUCOSAL RESECTION N/A 08/26/2019   Procedure: ENDOSCOPIC MUCOSAL RESECTION;  Surgeon: Yetta Flock, MD;  Location: [facility];  Service: Gastroenterology;  Laterality: N/A;  . INGUINAL HERNIA REPAIR Bilateral 05/21/2014   Procedure: OPEN BILATERAL INGUINAL HERNIA REPAIRS WITH MESH;  Surgeon: Donnie Mesa, MD;  Location: [facility];  Service: General;  Laterality: Bilateral;  . POLYPECTOMY  03/25/2019   Procedure: POLYPECTOMY;  Surgeon: Yetta Flock, MD;  Location: [facility];  Service: Gastroenterology;;  . POLYPECTOMY  08/26/2019   Procedure: POLYPECTOMY;  Surgeon: Yetta Flock, MD;  Location: [facility];  Service: Gastroenterology;;  . Woodward Ku  08/26/2019   Procedure: Sprayed methylene blue;  Surgeon: Yetta Flock, MD;  Location: [facility];  Service: Gastroenterology;;  . SMALL INTESTINE SURGERY  1968, 2001    related to Chrohn's disease  . TONSILLECTOMY      Current Outpatient Medications:  .  acetaminophen (TYLENOL) 500 MG tablet, Take 500-1,000 mg by mouth every 8 (eight) hours as needed for mild pain or headache. , Disp: , Rfl:  .  Ascorbic Acid (VITAMIN C) 1000 MG tablet, Take 1,000 mg by mouth daily with breakfast. , Disp: , Rfl:  .  aspirin 81 MG chewable tablet, Chew 1 tablet (81 mg total) by mouth daily., Disp: 90 tablet, Rfl: 0 .  augmented betamethasone dipropionate (DIPROLENE-AF) 0.05 % cream, Apply 1 application topically 2 (two) times daily as needed (Grover's disease). , Disp: , Rfl:  .  brimonidine (ALPHAGAN) 0.2 % ophthalmic solution, Place 1 drop into the left eye See admin instructions. Instill 1 drop into the left eye twice a day- 6:45 AM and 5:50 PM, Disp: , Rfl:  .  Calcium Carbonate-Vit D-Min (CALCIUM 1200 PO), Take 1,200 mg by mouth daily with breakfast., Disp: , Rfl:  .  Cholecalciferol (VITAMIN D3) 50 MCG (2000 UT) TABS, Take 2,000 Units by mouth daily with breakfast., Disp: , Rfl:  .  cyanocobalamin (,VITAMIN B-12,) 1000 MCG/ML injection, INJECT 1 ML INTO THE MUSCLE EVERY 30 DAYS (Patient taking differently: Inject 1,000 mcg into the skin every 30 (thirty) days. ), Disp: 10 mL, Rfl: 0 .  dorzolamide-timolol (COSOPT) 22.3-6.8 MG/ML ophthalmic solution, Place 1 drop into the left eye See admin instructions. Instill 1 drop into the left eye twice a day- 6:35 AM and 5:30 PM, Disp: , Rfl:  .  ferrous sulfate 325 (65 FE) MG tablet, Take 325 mg by mouth every Monday, Wednesday, and  Friday. , Disp: , Rfl:  .  latanoprost (XALATAN) 0.005 % ophthalmic solution, Place 1 drop into the left eye See admin instructions. Instill 1 drop into the left eye at 6:10 PM daily, Disp: , Rfl:  .  loperamide (IMODIUM A-D) 2 MG tablet, Take 2 mg by mouth See admin instructions. Take 2 mg by mouth one to two times a day as needed/as directed, Disp: , Rfl:  .  Netarsudil Dimesylate (RHOPRESSA) 0.02 % SOLN, Place 1 drop into the left eye See admin instructions. Instill 1 drop into the left eye at 6:15 PM daily, Disp: , Rfl:  .  omeprazole (PRILOSEC OTC) 20 MG tablet, Take 20 mg by mouth daily as needed (for reflux symptoms)., Disp: , Rfl:  .  prednisoLONE acetate (PRED FORTE) 1 % ophthalmic suspension, Place 1 drop into the right eye See admin instructions. Instill 1 drop into the right eye four times a day- 7:00 AM, 12:00 noon, 4:00 PM, and 6:00 PM, Disp: , Rfl:   No Known Allergies Review of Systems Objective:  There were no vitals filed for this visit.  General: Well developed, nourished, in no acute distress, alert and oriented x3   Dermatological: Skin is warm, dry and supple bilateral. Nails x 10 are well maintained; remaining integument appears unremarkable at this time. There are no open sores, no preulcerative lesions, no rash or signs of infection present.  Vascular: Dorsalis Pedis artery and Posterior Tibial artery pedal pulses are 2/4 bilateral with immedate capillary fill time. Pedal hair growth present. No varicosities and no lower extremity edema present bilateral.   Neruologic: Grossly intact via light touch bilateral. Vibratory intact via tuning fork bilateral. Protective threshold with Semmes Wienstein monofilament intact to all pedal sites bilateral. Patellar and Achilles deep tendon reflexes 2+ bilateral. No Babinski or clonus noted bilateral.   Musculoskeletal: No gross boney pedal deformities bilateral. No pain, crepitus, or limitation noted with foot and ankle range of  motion bilateral. Muscular strength 5/5 in all groups tested bilateral.  Gait: Unassisted, Nonantalgic.    Radiographs:  No radiographs were taken today  Assessment & Plan:   Assessment: Bursitis subfifth metatarsophalangeal joint of the right foot.  Porokeratosis subfifth metatarsal head of the right foot.  Plan: Debrided all reactive hyperkeratotic tissue beneath the fifth metatarsal head of the right foot.  I also injected the area today for bursitis with 2 mg of dexamethasone and local anesthetic.     Ariabella Brien T. [place], [place]

## 2019-10-17 LAB — METHYLMALONIC ACID, SERUM: Methylmalonic Acid: 277 nmol/L (ref 0–378)

## 2019-10-17 LAB — B12 AND FOLATE PANEL
Folate: 10.5 ng/mL (ref >3.0)
Vitamin B-12: 395 pg/mL (ref 232–1245)

## 2019-10-24 ENCOUNTER — Ambulatory Visit: Payer: Medicare Other

## 2019-10-24 ENCOUNTER — Other Ambulatory Visit: Payer: Self-pay

## 2019-10-24 DIAGNOSIS — R410 Disorientation, unspecified: Secondary | ICD-10-CM

## 2019-10-24 NOTE — Procedures (Signed)
° ° ° °  History: Michael [name] is an 86 year old gentleman with a history of a memory disorder. The patient has had episodes of confusion off and on over the last several months, at times he is unable to remember how to use a remote for a TV. The patient is being evaluated for these episodic confusional spells.  This is a routine EEG. No skull defects are noted. Medications include vitamin C, aspirin, calcium supplementation, vitamin D, vitamin B12, Cosopt, iron supplementation, Xalatan, and Prilosec.  EEG classification: Dysrhythmia grade 1 generalized  Description of the recording: The background rhythms of this recording consists of a well-modulated medium amplitude background rhythm of 7 Hz that is reactive to eye opening closure. As the record progresses, photic stimulation was performed and results in a slight bilateral photic driving response. Hyperventilation is also performed and resulted in very minimal buildup of the background rhythm activities without significant slowing seen. At no time during the recording does there appear to be evidence of spike or spike-wave discharges or evidence of focal slowing. EKG monitor shows no evidence of cardiac rhythm abnormalities with a heart rate of 72.  Impression: This is an abnormal EEG recording secondary to diffuse background slowing. This is a nonspecific finding, and may be related to any dementing type illness or a possible low-grade toxic or metabolic encephalopathy. No epileptiform discharges were seen.

## 2019-10-29 NOTE — Progress Notes (Signed)
No epileptiform activity or propensity towards seizures seen. The EEG was slowed however which can be seen in memory loss disorders. You may have already resulted this, thanks

## 2019-10-30 ENCOUNTER — Telehealth: Payer: Self-pay | Admitting: *Deleted

## 2019-10-30 NOTE — Telephone Encounter (Addendum)
-----   Message from Melvenia Beam, MD sent at 10/29/2019  5:48 PM EDT -----   ----- Message ----- From: Kathrynn Ducking, MD Sent: 10/24/2019   5:30 PM EDT To: Melvenia Beam, MD, Binnie Rail, MD  EEG did not show any seizure activity or propensity for seizures. It did show slowing which is often seen with memory loss disorders. Thanks, Dr. Jaynee Eagles

## 2019-10-30 NOTE — Telephone Encounter (Signed)
I called the pt/wife and LVM (ok per DPR) advising of the EEG results listed below in detail per Dr Jaynee Eagles. I provided office number in case should have questions.

## 2019-11-05 NOTE — Telephone Encounter (Signed)
Pt's wife is asking for a call back from [facility] she says other than between 11-12:30 she will be available to take your call

## 2019-11-05 NOTE — Telephone Encounter (Signed)
Pt's wife called me back. We discussed the EEG results and her questions she had about the EEG. She is aware the formal neurocognitive testing is advised still and it has been scheduled already for next month. She verbalized appreciation for the discussion.

## 2019-11-05 NOTE — Telephone Encounter (Signed)
I returned Ronda's call and LVM with office number for call back.

## 2019-11-07 ENCOUNTER — Encounter: Payer: Medicare Other | Admitting: Internal Medicine

## 2019-11-11 DIAGNOSIS — Z85828 Personal history of other malignant neoplasm of skin: Secondary | ICD-10-CM | POA: Diagnosis not present

## 2019-11-11 DIAGNOSIS — L821 Other seborrheic keratosis: Secondary | ICD-10-CM | POA: Diagnosis not present

## 2019-11-11 DIAGNOSIS — D1801 Hemangioma of skin and subcutaneous tissue: Secondary | ICD-10-CM | POA: Diagnosis not present

## 2019-11-11 DIAGNOSIS — L570 Actinic keratosis: Secondary | ICD-10-CM | POA: Diagnosis not present

## 2019-11-11 DIAGNOSIS — D692 Other nonthrombocytopenic purpura: Secondary | ICD-10-CM | POA: Diagnosis not present

## 2019-11-11 DIAGNOSIS — C44519 Basal cell carcinoma of skin of other part of trunk: Secondary | ICD-10-CM | POA: Diagnosis not present

## 2019-11-13 ENCOUNTER — Telehealth: Payer: Self-pay | Admitting: Internal Medicine

## 2019-11-13 MED ORDER — "SYRINGE 25G X 1"" 3 ML MISC": 0 refills | Status: DC

## 2019-11-13 MED ORDER — CYANOCOBALAMIN 1000 MCG/ML IJ SOLN
INTRAMUSCULAR | 1 refills | Status: DC
Start: 2019-11-13 — End: 2021-01-15

## 2019-11-13 NOTE — Telephone Encounter (Signed)
    Patient requesting refill for cyanocobalamin (,VITAMIN B-12,) 1000 MCG/ML injection to [facility] # [place], [place]

## 2019-11-19 ENCOUNTER — Ambulatory Visit (INDEPENDENT_AMBULATORY_CARE_PROVIDER_SITE_OTHER): Payer: Medicare Other | Admitting: Gastroenterology

## 2019-11-19 ENCOUNTER — Encounter: Payer: Self-pay | Admitting: Gastroenterology

## 2019-11-19 VITALS — BP 120/82 | HR 74 | Ht 71.0 in | Wt 183.0 lb

## 2019-11-19 DIAGNOSIS — Z86010 Personal history of colonic polyps: Secondary | ICD-10-CM

## 2019-11-19 DIAGNOSIS — G459 Transient cerebral ischemic attack, unspecified: Secondary | ICD-10-CM | POA: Diagnosis not present

## 2019-11-19 DIAGNOSIS — K50019 Crohn's disease of small intestine with unspecified complications: Secondary | ICD-10-CM

## 2019-11-19 NOTE — Patient Instructions (Addendum)
If you are age 84 or older, your body mass index should be between 23-30. Your Body mass index is 25.52 kg/m. If this is out of the aforementioned range listed, please consider follow up with your Primary Care Provider.  If you are age 33 or younger, your body mass index should be between 19-25. Your Body mass index is 25.52 kg/m. If this is out of the aformentioned range listed, please consider follow up with your Primary Care Provider.    Please follow up in the office on Tuesday, 02-04-20 at 10:30am.   Thank you for entrusting me with your care and for choosing [facility], Dr. Baneberry Cellar

## 2019-11-19 NOTE — Progress Notes (Signed)
HPI :  86 year old male here for follow-up for Crohn's disease.   Crohn's history: Previously followed by Dr. Earle Gell. 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 He has had 2 operations for his Crohns in 1968 and then again 2001 or so for small bowel resections. He remotely was treated with prednisone as needed in the past. He was never been on any other maintenance therapy for Crohn's disease other than prednisone and surgery. He has had hospitalizations in the past for bowel obstructions. Unfortunately he has suffered complications from chronic steroid use. He is currently being followed by Endocrinology foradrenal insufficiency. He has also been noted to have hepatic steatosis and avascular necrosis of the hips on CT scan. His course has also been complicated by history of diverticulosis related bleeding.Most recently has been using budesonide for flares as needed, and noted to have numerous colon polyps.   SINCE LAST VISIT:  86 year old male here for follow-up visit. Last colonoscopy with me was in August 2021. Again a few large polyps removed, he has adenoma invading or close to his surgical anastomosis.  I have presented his case at the IBD multidisciplinary conference to get his opinions from surgeons.  He has declined surgery for this to date.  He has been using budesonide as needed for flares of symptoms, he last took that late summer.  He states he has not had any abdominal pain whatsoever in the past few months.  Bowels are stable.  No blood in his stools.  His course was most recently complicated by TIA he had in September when he presented with garbled speech and confusion.  He is admitted to [facility] long hospital for observation, had a echocardiogram and MRI of his brain.  He was treated with Plavix for 3 weeks and then transition to baby aspirin.  He has follow-up with cardiology next week.  Had a lengthy discussion with the patient and his wife in terms of how aggressive  they want to be with management of his Crohn's disease and colon polyps at this point.  He is otherwise feeling well today without complaints. We have discussed biologic therapy in the past which he has declined.  We looked into getting Entyvio however its over $2000 per infusion which is not affordable.  Colonoscopy 12/2015 - [name] - normal colon, patent ileocolonic anastomosis, active Crohn's ileitis  Colonoscopy 01/24/2019  - Preparation of the colon was fair. - Crohn's disease with mild ileitis. - Patent end-to-side ileo-colonic anastomosis, characterized by mild stenosis. Biopsied. - Abnormal mucosa in the ascending colon. Biopsied. - Two 3 to 6 mm polyps in the ascending colon, removed with a cold snare. Resected and retrieved. - Eight 4 to 12 mm polyps in the transverse colon, removed with a cold snare. Resected and retrieved. - One large polyp in the transverse colon. Tattooed. Not removed as outlined above - One 10 mm polyp at the splenic flexure, removed with a cold snare. Resected and retrieved. - One 10 mm polyp in the descending colon, removed with a cold snare. Resected and retrieved. - Five 5 to 10 mm polyps in the sigmoid colon, removed with a cold snare. Resected and retrieved. - Two 4 mm polyps polyps at the recto-sigmoid colon, removed with a cold snare. Resected and retrieved. - Diverticulosis in the entire examined colon. - Stool in the entire examined colon. - Colonic spasm. - The examination was otherwise normal.  Most polyps adenomatous Biopsies of right sided abnormality adenomatous  Colonoscopy 03/25/19  - Preparation  of the colon was fair with significant spasm which prolonged this exam - Active ileal Crohn's disease as described - [name] i2. - Patent end-to-side ileo-colonic anastomosis, characterized by inflammation. - Numerous flat polyps noted in the ascending colon and proximal transverse colon as noted. Several minutes spent lavaging the  colon and methylene blue used to evaluate the right colon to better delineate some of these especially the largest lesion which may extend into the surgical anastomosis. Largest transverse polyp removed as outlined, with another large transverse polyp noted today which was not removed given duration of procedure and colonic spasm.  FINAL MICROSCOPIC DIAGNOSIS:   A. COLON, RIGHT, POLYPECTOMY:  - Tubular adenoma (x2 fragments).  - No high grade dysplasia or malignancy.   B. COLON, RIGHT NEAR ANASTAMOSIS, POLYPECTOMY:  - Tubular adenoma (multiple fragments).  - No high grade dysplasia or malignancy.   C. COLON, PROXIMAL TRANSVERSE, POLYPECTOMY:  - Tubular adenoma (multiple fragments).  - No high grade dysplasia or malignancy.   D. COLON, TRANSVERSE, LARGE, POLYPECTOMY:  - Tubular adenoma (multiple fragments).  - No high grade dysplasia or malignancy.    Colonoscopy 08/26/19 - Crohn's disease with mildly active ileitis. - Patent end-to-side ileo-colonic anastomosis. - Prior polypectomy site in the ascending colon, unclear if residual polyp vs. normal variant ileal tissue given location at the anatomosis. Area removed piecemeal using a cold snare. Resected and retrieved. - Chromoendoscopy applied to the proximal transverse and right colon. - Two 5 to 20 mm polyps in the transverse colon, removed piecemeal using a cold snare. Resected and retrieved. - Diverticulosis in the transverse colon and in the left colon. - The examination was otherwise normal. - Spatic colon treated with glucagon. Overall, very challenging exam, time needed to clear the colon, visualization difficult given spasm. I think the colon at this point has been cleared of all high risk lesions however difficult to say if there is residual / recurrence at the polyp site near the anastomosis. Under normal circumstances surgery would be recommended to remove right colon and anastomosis however given patient's age he  has elected for colonoscopy surveillance.   FINAL MICROSCOPIC DIAGNOSIS:   A. COLON, RIGHT, SNARE POLYPECTOMY:  - Tubular adenoma(s)  - Negative for high-grade dysplasia or malignancy   B. COLON, TRANSVERSE, SNARE POLYPECTOMY:  - Tubular adenoma(s)  - Negative for high-grade dysplasia or malignancy   C. COLON, TRANSVERSE, SNARE POLYPECTOMY:  - Tubular adenoma(s)  - Negative for high-grade dysplasia or malignancy   Past Medical History:  Diagnosis Date  . Adrenal insufficiency ([place])   . Anemia   . Avascular necrosis ([place])   . Colon polyp   . Crohn's disease ([place])   . Glaucoma   . Inguinal hernia recurrent bilateral   . Low testosterone   . Osteopenia   . Retinal ischemia      Past Surgical History:  Procedure Laterality Date  . ABDOMINAL SURGERY    . COLONOSCOPY N/A 12/13/2015   Procedure: COLONOSCOPY;  Surgeon: Laurence Spates, MD;  Location: [facility];  Service: Endoscopy;  Laterality: N/A;  . COLONOSCOPY WITH PROPOFOL N/A 03/25/2019   Procedure: COLONOSCOPY WITH PROPOFOL;  Surgeon: Yetta Flock, MD;  Location: [facility];  Service: Gastroenterology;  Laterality: N/A;  . COLONOSCOPY WITH PROPOFOL N/A 08/26/2019   Procedure: COLONOSCOPY WITH PROPOFOL;  Surgeon: Yetta Flock, MD;  Location: [facility];  Service: Gastroenterology;  Laterality: N/A;  . ENDOSCOPIC MUCOSAL RESECTION N/A 08/26/2019   Procedure: ENDOSCOPIC MUCOSAL RESECTION;  Surgeon: [place] Cellar  P, MD;  Location: [facility];  Service: Gastroenterology;  Laterality: N/A;  . INGUINAL HERNIA REPAIR Bilateral 05/21/2014   Procedure: OPEN BILATERAL INGUINAL HERNIA REPAIRS WITH MESH;  Surgeon: Donnie Mesa, MD;  Location: [facility];  Service: General;  Laterality: Bilateral;  . POLYPECTOMY  03/25/2019   Procedure: POLYPECTOMY;  Surgeon: Yetta Flock, MD;  Location: [facility];  Service: Gastroenterology;;  . POLYPECTOMY  08/26/2019   Procedure: POLYPECTOMY;   Surgeon: Yetta Flock, MD;  Location: [facility];  Service: Gastroenterology;;  . Woodward Ku  08/26/2019   Procedure: Sprayed methylene blue;  Surgeon: Yetta Flock, MD;  Location: [facility];  Service: Gastroenterology;;  . SMALL INTESTINE SURGERY  1968, 2001    related to Chrohn's disease  . TONSILLECTOMY     Family History  Problem Relation Age of Onset  . Deep vein thrombosis Mother   . Heart disease Father   . Stomach cancer Neg Hx   . Colon cancer Neg Hx   . Esophageal cancer Neg Hx   . Pancreatic cancer Neg Hx   . Rectal cancer Neg Hx   . Colon polyps Neg Hx    Social History   Tobacco Use  . Smoking status: Former Smoker    Quit date: 04/01/1976    Years since quitting: 43.6  . Smokeless tobacco: Never Used  Vaping Use  . Vaping Use: Never used  Substance Use Topics  . Alcohol use: Yes    Comment: 3-4  glass wine  weekly  . Drug use: No   Current Outpatient Medications  Medication Sig Dispense Refill  . budesonide (ENTOCORT EC) 3 MG 24 hr capsule Take by mouth as needed.    Marland Kitchen acetaminophen (TYLENOL) 500 MG tablet Take 500-1,000 mg by mouth every 8 (eight) hours as needed for mild pain or headache.     . Ascorbic Acid (VITAMIN C) 1000 MG tablet Take 1,000 mg by mouth daily with breakfast.     . aspirin 81 MG chewable tablet Chew 1 tablet (81 mg total) by mouth daily. 90 tablet 0  . 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     . brimonidine (ALPHAGAN) 0.2 % ophthalmic solution Place 1 drop into the left eye See admin instructions. Instill 1 drop into the left eye twice a day- 6:45 AM and 5:50 PM    . Calcium Carbonate-Vit D-Min (CALCIUM 1200 PO) Take 1,200 mg by mouth daily with breakfast.    . Cholecalciferol (VITAMIN D3) 50 MCG (2000 UT) TABS Take 2,000 Units by mouth daily with breakfast.    . cyanocobalamin (,VITAMIN B-12,) 1000 MCG/ML  injection INJECT 1 ML INTO THE MUSCLE EVERY 30 DAYS 10 mL 1  . dorzolamide-timolol (COSOPT) 22.3-6.8 MG/ML ophthalmic solution Place 1 drop into the left eye See admin instructions. Instill 1 drop into the left eye twice a day- 6:35 AM and 5:30 PM    . ferrous sulfate 325 (65 FE) MG tablet Take 325 mg by mouth every Monday, Wednesday, and Friday.     . latanoprost (XALATAN) 0.005 % ophthalmic solution Place 1 drop into the left eye See admin instructions. Instill 1 drop into the left eye at 6:10 PM daily    . loperamide (IMODIUM A-D) 2 MG tablet Take 2 mg by mouth daily.     Mckinley Jewel Dimesylate (RHOPRESSA) 0.02 % SOLN Place 1 drop into the left eye See admin instructions. Instill  1 drop into the left eye at 6:15 PM daily    . omeprazole (PRILOSEC OTC) 20 MG tablet Take 20 mg by mouth daily as needed (for reflux symptoms).    . prednisoLONE acetate (PRED FORTE) 1 % ophthalmic suspension Place 1 drop into the right eye See admin instructions. Instill 1 drop into the right eye four times a day- 7:00 AM, 12:00 noon, 4:00 PM, and 6:00 PM    . Syringe/Needle, Disp, (SYRINGE 3CC/25GX1") 25G X 1" 3 ML MISC Use for monthly B12 injections 12 each 0   No current facility-administered medications for this visit.   No Known Allergies   Review of Systems: All systems reviewed and negative except where noted in HPI.   Lab Results  Component Value Date   WBC 7.5 09/11/2019   HGB 11.6 (L) 09/11/2019   HCT 34.0 (L) 09/11/2019   MCV 95.3 09/11/2019   PLT 167 09/11/2019    Lab Results  Component Value Date   CREATININE 1.00 09/11/2019   BUN 14 09/11/2019   NA 139 09/11/2019   K 4.0 09/11/2019   CL 101 09/11/2019   CO2 27 09/11/2019    Lab Results  Component Value Date   ALT 10 09/11/2019   AST 13 (L) 09/11/2019   ALKPHOS 45 09/11/2019   BILITOT 1.2 09/11/2019     Physical Exam: BP 120/82   Pulse 74   Ht 5' 11"  (1.803 m)   Wt 183 lb (83 kg)   SpO2 99%   BMI 25.52 kg/m   Constitutional: Pleasant,well-developed, male in no acute distress. HEENT: Normocephalic and atraumatic. Conjunctivae are normal. No scleral icterus. Neck supple.  Cardiovascular: Normal rate, regular rhythm.  Pulmonary/chest: Effort normal and breath sounds normal.  Abdominal: Soft, nondistended, nontender. There are no masses palpable. Extremities: no edema Lymphadenopathy: No cervical adenopathy noted. Neurological: Alert and oriented to person place and time. Skin: Skin is warm and dry. No rashes noted. Psychiatric: Normal mood and affect. Behavior is normal.   ASSESSMENT AND PLAN: 86 year old male here for reassessment the following:  Crohn's disease / history of colon polyps / TIA - history as outlined above, obstructive Crohn's disease status post 2 surgical resections of small bowel years ago, on no maintenance therapy ever other than steroids as needed for flareups, has had complications from steroids.  In recent years he has declined biologic therapy, manage flares with budesonide as needed and has had a few hospitalizations for obstructions but generally has been doing okay in regards to his Crohn's disease.  The more concerning finding in the past year has been numerous flat adenomas in his transverse and right colon.  These have been very challenging to see and clear in light of difficulty with his bowel prep.  1 of these has appeared to invade or be in close approximation to his anastomosis.  We did discuss that standard of care for this situation given high risk for colon cancer would be resection of his anastomosis and a portion of if not significant amount of his colon.  Given his age and comorbidities he has declined surgery.  He has opted for a few colonoscopies this year in hopes of reducing his risk for getting colon cancer in his lifetime.  I think based off the last exam we have removed the bulk of the burden of large polyps, although he very likely has residual polyp at the  surgical anastomosis which I do not think is amenable to complete endoscopic resection given his anatomy.  We discussed that given he is declining surgery, goal is to reduce his risk for colon cancer and we can consider periodic colonoscopy to remove polyps.  The question is how frequently to do this and at what point to do a stop doing this as he ages.  He recently had a TIA and has recovered from that.  I would like to give him another 3 months to see how he is doing in that regard and I will see him back in January.  We may consider another colonoscopy in the spring time or so.  I had a lengthy discussion with the patient and his wife about all of these issues how aggressive he wants to be with his care.  They understand his risk of colon cancer without surgery, but hopefully colonoscopy exams can minimize that risk over time.  I will see him back in the office in January, he will call me in the interim if any questions or concerns.  [place] Cellar, MD [facility]

## 2019-11-22 ENCOUNTER — Encounter: Payer: Self-pay | Admitting: Counselor

## 2019-11-22 ENCOUNTER — Ambulatory Visit (INDEPENDENT_AMBULATORY_CARE_PROVIDER_SITE_OTHER): Payer: Medicare Other | Admitting: Counselor

## 2019-11-22 ENCOUNTER — Other Ambulatory Visit: Payer: Self-pay

## 2019-11-22 DIAGNOSIS — F09 Unspecified mental disorder due to known physiological condition: Secondary | ICD-10-CM

## 2019-11-22 DIAGNOSIS — I6781 Acute cerebrovascular insufficiency: Secondary | ICD-10-CM

## 2019-11-22 NOTE — Progress Notes (Signed)
[facility]  Patient Name: Michael [name] MRN: [id number] Date of Birth: 1932/08/21 Age: 86 y.o. Education: 16 years  Referral Circumstances and Background Information  Michael [name] is a 86 y.o., right-hand dominant, married man with a history of Crohn's disease, hearing loss, B12 deficiency, TIA and memory loss that has been slowly progressive but became more apparent to his wife after an episode in the Winter/Spring, 2021 when he was confused about how to turn the phone on. He was referred by my neurology colleague Dr. Sarina Ill at [facility] who suspected incipient neurodegeneration despite preserved (29/30) MMSE performance.    The patient presented to the interview on his own. He provided me with verbal consent to speak with his wife, although he said she wouldn't be available today as a result of doctors appointments. As a result, the history was limited. He denied that he is having any memory problems and feels as though he is in his normal state of health, with the exception of one episode confusion where he couldn't answer a question, perhaps a month ago. I specifically asked him about the episode where he was unable to operate the phone, which he didn't recall. I asked about his EEG results, and he stated that the office did not call him. I see that they did call him and he had diffuse slowing. With respect to mood, the patient said he was fine, although when I specifically asked he said he has gotten depressed about COVID at times. "It's been hell for everybody." He stated that he sleeps well, although he has nocturia X3, he can usually get back to sleep. He estimated he is sleeping about 6 hours a night. He stated that his energy is good, he plays tennis, and is doing that once a week. His appetite is decent although he isn't eating as much as he used to.   With respect to functioning, the patient's wife is managing the finances, which she has  done for many years. He denied that it was a result of memory and thinking problems. He stated that he manages his own medications and was able to tell me that he has glaucoma and has drops for that. He denied being on any other medications, which is essentially true, although he is taking a number of supplements. He is doing adequately with driving and isn't getting lost. He lives in a town house so he doesn't have to do maintenance. He doesn't do cooking, chores, or other things around the house. He doesn't use a computer. He spends most of his time walking, watching TV, and with weekly tennis. He stated he walks about a mile a day.    Past Medical History and Review of Relevant Studies   Patient Active Problem List   Diagnosis Date Noted  . TIA (transient ischemic attack) 09/11/2019  . History of colonic polyps   . Benign neoplasm of colon   . Overactive bladder 05/06/2019  . Memory changes 05/06/2019  . Polyp of large intestine   . Asymptomatic varicose veins of left lower extremity 12/16/2017  . Hyperglycemia 11/02/2017  . 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 Clavicle fracture 11/02/2017  . Vitamin D deficiency 11/02/2017  . History of DVT of lower extremity, left leg 07/2017 08/08/2017  . SBO (small bowel obstruction) ([place]) 07/05/2017  . Bilateral hearing loss 05/31/2017  . B12 deficiency 02/06/2017  . Osteopenia 02/06/2017  . Glaucoma 12/12/2015  . Crohn's disease ([place]) 12/12/2015  . Anemia  12/12/2015  . Retinal ischemia 12/06/2011    Review of Neuroimaging and Relevant Medical History: Patient had EEG 10/24/2019 that shows diffuse slowing.   The patient has an MRI of the brain from 09/11/2019 that shows a mild+ burden of early confluent leukoaraiosis with slight posterior predominance. There is a mild burden of volume loss that appears generalized, with no particular lobar predominance, although I question if there isn't just a bit more in posterior areas visible around the posterior  cingulate and also with some compensatory dilatation of the lateral ventricle. 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 The imaging is nondiagnostic.   Current Outpatient Medications  Medication Sig Dispense Refill  . acetaminophen (TYLENOL) 500 MG tablet Take 500-1,000 mg by mouth every 8 (eight) hours as needed for mild pain or headache.     . Ascorbic Acid (VITAMIN C) 1000 MG tablet Take 1,000 mg by mouth daily with breakfast.     . aspirin 81 MG chewable tablet Chew 1 tablet (81 mg total) by mouth daily. 90 tablet 0  . 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     . brimonidine (ALPHAGAN) 0.2 % ophthalmic solution Place 1 drop into the left eye See admin instructions. Instill 1 drop into the left eye twice a day- 6:45 AM and 5:50 PM    . budesonide (ENTOCORT EC) 3 MG 24 hr capsule Take by mouth as needed.    . Calcium Carbonate-Vit D-Min (CALCIUM 1200 PO) Take 1,200 mg by mouth daily with breakfast.    . Cholecalciferol (VITAMIN D3) 50 MCG (2000 UT) TABS Take 2,000 Units by mouth daily with breakfast.    . cyanocobalamin (,VITAMIN B-12,) 1000 MCG/ML injection INJECT 1 ML INTO THE MUSCLE EVERY 30 DAYS 10 mL 1  . dorzolamide-timolol (COSOPT) 22.3-6.8 MG/ML ophthalmic solution Place 1 drop into the left eye See admin instructions. Instill 1 drop into the left eye twice a day- 6:35 AM and 5:30 PM    . ferrous sulfate 325 (65 FE) MG tablet Take 325 mg by mouth every Monday, Wednesday, and Friday.     . latanoprost (XALATAN) 0.005 % ophthalmic solution Place 1 drop into the left eye See admin instructions. Instill 1 drop into the left eye at 6:10 PM daily    . loperamide (IMODIUM A-D) 2 MG tablet Take 2 mg by mouth daily.     Mckinley Jewel Dimesylate (RHOPRESSA) 0.02 % SOLN Place 1 drop into the left eye See admin instructions. Instill 1 drop into the left eye at 6:15 PM daily    .  omeprazole (PRILOSEC OTC) 20 MG tablet Take 20 mg by mouth daily as needed (for reflux symptoms).    . prednisoLONE acetate (PRED FORTE) 1 % ophthalmic suspension Place 1 drop into the right eye See admin instructions. Instill 1 drop into the right eye four times a day- 7:00 AM, 12:00 noon, 4:00 PM, and 6:00 PM    . Syringe/Needle, Disp, (SYRINGE 3CC/25GX1") 25G X 1" 3 ML MISC Use for monthly B12 injections 12 each 0   No current facility-administered medications for this visit.   Family History  Problem Relation Age of Onset  . Deep vein thrombosis Mother   . Heart disease Father   . Stomach cancer Neg Hx   . Colon cancer Neg Hx   . Esophageal cancer Neg Hx   . Pancreatic cancer Neg Hx   . Rectal cancer  Neg Hx   . Colon polyps Neg Hx    There is no  family history of dementia. There is no  family history of psychiatric illness.  Psychosocial History  Developmental, Educational and Employment History: The patient grew up in [place] and reported a good childhood. He reported that he wasn't very involved in school, although he did adequately, he earned mostly B's and C's and the occasional A. He denied that he had any learning problems. He went to [employer] and earned a bachelor's in business administration. 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 He retired in 2010 and worked into his 42s.   Psychiatric History: The patient denied any problems with depression or any psychiatric treatment.   Substance Use History: The patient drinks alcohol occasionally, not every day. He denied drinking excessively. He doesn't use nicotine, he is a former smoker and quit in the 1970s.   Relationship History and Living Cimcumstances: The patient and his wife have been married around 69 years. He has three children, two of whom live in the area. He denied that they are concerned about his memory and thinking.   Mental Status and Behavioral  Observations  Sensorium/Arousal: The patient's level of arousal was awake and alert. Hearing and vision were adequate with correction for testing purposes, although he did need repetition at times.  Orientation: The patient was fully oriented to person, place, time, and date. He was aware of the current and past presidents.  Appearance: Dressed in appropriate, casual clothing, with reasonable grooming and hygiene.  Behavior: Pleasant, appropriate Speech/language: The patient's speech was normal in rate, rhythm, volume and prosody. He did have occasional word finding pauses.  Gait/Posture: Appeared within gross limits of normal.  Movement: No overt signs/symptoms of movement disorder noted, exam was normal for Dr. Jaynee Eagles Social Comportment: Appropriate Mood: "Good" Affect: Mainly euthymic Thought process/content: Thought process was logical, linear, and goal-oriented for the most part. Content was appropriate.  Safety: No safety concerns identified at the present visit Insight: Questionable  Montreal Cognitive Assessment  11/22/2019  Visuospatial/ Executive (0/5) 3  Naming (0/3) 3  Attention: Read list of digits (0/2) 2  Attention: Read list of letters (0/1) 1  Attention: Serial 7 subtraction starting at 100 (0/3) 3  Language: Repeat phrase (0/2) 0  Language : Fluency (0/1) 0  Abstraction (0/2) 1  Delayed Recall (0/5) 2  Orientation (0/6) 6  Total 21  Adjusted Score (based on education) 21   Clock Drawing: 9/10 WNL  Verbal Fluency: Phonemic Fluency (F-A-S):L T = 27 Semantic Fluency (Animals): T = 40  GDS-SF: 1 WNL  Plan  Michael [name] was seen for a psychiatric diagnostic evaluation and neuropsychological testing. He is a pleasant, 86 year old, right-hand dominant, married man with a history of Crohn's disease, B12 deficiency, hearing loss and memory and thinking problems noticed over approximately the past year by his wife. He presented to the appointment alone today, and  did not wish to complete testing, he doesn't see the point. He also stated that he is not following up with Dr. Jaynee Eagles because he doesn't think he needs to. I conducted a full and complete informed consent, and explained the benefits of testing in his case, which would be helpful diagnostically and to inform treatment. A mental status examination and some limited testing was also completed. He did not wish to complete full testing even when offered an abbreviated battery, and wanted to "think  it over" and talk with his wife. He is screening in the MCI range on the MoCA and the normal range on the MMSE and presumably still has decision making capacity, as he presented as fairly reasonable. I provided him a card and encouraged him to reschedule. Preliminary impression is that of MCI, which can certainly be due to vascular disease but more than likely has some component of Alzheimer's given his demographics. He was able to retain 2/5 words on the MoCA, which is not bad considering his weak encoding (possibly due to hearing loss).   Viviano Simas Nicole Kindred, PsyD, [facility] Clinical Neuropsychologist  Informed Consent and Coding/Compliance  Risks and benefits of the evaluation were discussed with the patient prior to all testing procedures. Billing below reflects technician time, my direct face-to-face time with the patient, time spent in test administration, and time spent in professional activities including but not limited to: neuropsychological test interpretation, integration of neuropsychological test data with clinical history, report preparation, treatment planning, care coordination, and review of diagnostically pertinent medical history or studies.   Services associated with this encounter: Clinical Interview plus  34 minutes (21747; Neuropsychological Evaluation by Professional)  16 minutes (15953; Test Administration by Professional)

## 2019-11-25 ENCOUNTER — Encounter: Payer: Self-pay | Admitting: *Deleted

## 2019-11-25 ENCOUNTER — Ambulatory Visit: Payer: Medicare Other | Admitting: Cardiovascular Disease

## 2019-11-25 ENCOUNTER — Other Ambulatory Visit: Payer: Self-pay

## 2019-11-25 ENCOUNTER — Encounter: Payer: Self-pay | Admitting: Cardiovascular Disease

## 2019-11-25 VITALS — BP 126/76 | HR 77 | Ht 71.0 in | Wt 181.6 lb

## 2019-11-25 DIAGNOSIS — G459 Transient cerebral ischemic attack, unspecified: Secondary | ICD-10-CM

## 2019-11-25 NOTE — Patient Instructions (Addendum)
Medication Instructions:  Your physician recommends that you continue on your current medications as directed. Please refer to the Current Medication list given to you today.  *If you need a refill on your cardiac medications before your next appointment, please call your pharmacy*   Lab Work: None If you have labs (blood work) drawn today and your tests are completely normal, you will receive your results only by: Marland Kitchen MyChart Message (if you have MyChart) OR . A paper copy in the mail If you have any lab test that is abnormal or we need to change your treatment, we will call you to review the results.   Testing/Procedures: Your physician has recommended that you wear an event monitor. Event monitors are medical devices that record the heart's electrical activity. Doctors most often [place] these monitors to diagnose arrhythmias. Arrhythmias are problems with the speed or rhythm of the heartbeat. The monitor is a small, portable device. You can wear one while you do your normal daily activities. This is usually used to diagnose what is causing palpitations/syncope (passing out).    Follow-Up: At [facility], you and your health needs are our priority.  As part of our continuing mission to provide you with exceptional heart care, we have created designated Provider Care Teams.  These Care Teams include your primary Cardiologist (physician) and Advanced Practice Providers (APPs -  Physician Assistants and Nurse Practitioners) who all work together to provide you with the care you need, when you need it.  We recommend signing up for the patient portal called "MyChart".  Sign up information is provided on this After Visit Summary.  MyChart is used to connect with patients for Virtual Visits (Telemedicine).  Patients are able to view lab/test results, encounter notes, upcoming appointments, etc.  Non-urgent messages can be sent to your provider as well.   To learn more about what you can do with  MyChart, go to [email or web address].    Your next appointment:   2-3 month(s)  The format for your next appointment:   In Person  Provider:   You may see Lauree Chandler, MD or one of the following Advanced Practice Providers on your designated Care Team:    Melina Copa, PA-C  Ermalinda Barrios, PA-C    Other Instructions  [employer] Cardiac Event Monitor Instructions Your physician has requested you wear your cardiac event monitor for 30 days. [employer] may call or text to confirm a shipping address. The monitor will be sent to a land address via UPS. [employer] will not ship a monitor to a PO BOX. It typically takes 3-5 days to receive your monitor after it has been enrolled. [employer] will assist with USPS tracking if your package is delayed. The telephone number for [employer] is [phone]. Once you have received your monitor, please review the enclosed instructions. Instruction tutorials can also be viewed under help and settings on the enclosed cell phone. Your monitor has already been registered assigning a specific monitor serial # to you.  Applying the monitor Remove cell phone from case and turn it on. The cell phone works as Dealer and needs to be within [employer] of you at all times. The cell phone will need to be charged on a daily basis. We recommend you plug the cell phone into the enclosed charger at your bedside table every night.  Monitor batteries: You will receive two monitor batteries labelled #1 and #2. These are your recorders. Plug battery #2 onto the second connection on the enclosed charger.  Keep one battery on the charger at all times. This will keep the monitor battery deactivated. It will also keep it fully charged for when you need to switch your monitor batteries. A small light will be blinking on the battery emblem when it is charging. The light on the battery emblem will remain on when the battery is fully charged.  Open  package of a Monitor strip. Insert battery #1 into black hood on strip and gently squeeze monitor battery onto connection as indicated in instruction booklet. Set aside while preparing skin.  Choose location for your strip, vertical or horizontal, as indicated in the instruction booklet. Shave to remove all hair from location. There cannot be any lotions, oils, powders, or colognes on skin where monitor is to be applied. Wipe skin clean with enclosed Saline wipe. Dry skin completely.  Peel paper labeled #1 off the back of the Monitor strip exposing the adhesive. Place the monitor on the chest in the vertical or horizontal position shown in the instruction booklet. One arrow on the monitor strip must be pointing upward. Carefully remove paper labeled #2, attaching remainder of strip to your skin. Try not to create any folds or wrinkles in the strip as you apply it.  Firmly press and release the circle in the center of the monitor battery. You will hear a small beep. This is turning the monitor battery on. The heart emblem on the monitor battery will light up every 5 seconds if the monitor battery in turned on and connected to the patient securely. Do not push and hold the circle down as this turns the monitor battery off. The cell phone will locate the monitor battery. A screen will appear on the cell phone checking the connection of your monitor strip. This may read poor connection initially but change to good connection within the next minute. Once your monitor accepts the connection you will hear a series of 3 beeps followed by a climbing crescendo of beeps. A screen will appear on the cell phone showing the two monitor strip placement options. Touch the picture that demonstrates where you applied the monitor strip.  Your monitor strip and battery are waterproof. You are able to shower, bathe, or swim with the monitor on. They just ask you do not submerge deeper than 3 feet underwater. We  recommend removing the monitor if you are swimming in a lake, river, or ocean.  Your monitor battery will need to be switched to a fully charged monitor battery approximately once a week. The cell phone will alert you of an action which needs to be made.  On the cell phone, tap for details to reveal connection status, monitor battery status, and cell phone battery status. The green dots indicates your monitor is in good status. A red dot indicates there is something that needs your attention.  To record a symptom, click the circle on the monitor battery. In 30-60 seconds a list of symptoms will appear on the cell phone. Select your symptom and tap save. Your monitor will record a sustained or significant arrhythmia regardless of you clicking the button. Some patients do not feel the heart rhythm irregularities. [employer] will notify us of any serious or critical events.  Refer to instruction booklet for instructions on switching batteries, changing strips, the Do not disturb or Pause features, or any additional questions.  Call [employer] at [phone], to confirm your monitor is transmitting and record your baseline. They will answer any questions you may have regarding the  monitor instructions at that time.  Returning the monitor to [place] all equipment back into blue box. Peel off strip of paper to expose adhesive and close box securely. There is a prepaid UPS shipping label on this box. Drop in a UPS drop box, or at a UPS facility like [employer]. You may also contact [employer] to arrange UPS to pick up monitor package at your home.

## 2019-11-25 NOTE — Progress Notes (Signed)
Chief Complaint  Patient presents with  . New Patient (Initial Visit)    TIA   History of Present Illness: 86 yo male with history of TIA, memory loss, DVT, adrenal insufficiency, Crohn's disease here today as a new consult, referred by Dr. Quay Burow, for the evaluation of possible arrhythmia. He was admitted to [facility] 09/11/19 with a TIA. Carotid artery dopplers 09/11/19 with mild bilateral carotid artery disease. Echo September 2021 with LVEF=55-60%, no valve disease. CT showed probably remote L thalamic lacunar infarct which was confirmed on MRI brain.   Primary Care Physician: Binnie Rail, MD   Past Medical History:  Diagnosis Date  . Adrenal insufficiency ([place])   . Anemia   . Avascular necrosis ([place])   . Colon polyp   . Crohn's disease ([place])   . Glaucoma   . Inguinal hernia recurrent bilateral   . Low testosterone   . Osteopenia   . Retinal ischemia     Past Surgical History:  Procedure Laterality Date  . ABDOMINAL SURGERY    . COLONOSCOPY N/A 12/13/2015   Procedure: COLONOSCOPY;  Surgeon: Laurence Spates, MD;  Location: [facility];  Service: Endoscopy;  Laterality: N/A;  . COLONOSCOPY WITH PROPOFOL N/A 03/25/2019   Procedure: COLONOSCOPY WITH PROPOFOL;  Surgeon: Yetta Flock, MD;  [facility];  Service: Gastroenterology;  Laterality: N/A;  . COLONOSCOPY WITH PROPOFOL N/A 08/26/2019   Procedure: COLONOSCOPY WITH PROPOFOL;  Surgeon: Yetta Flock, MD;  [facility];  Service: Gastroenterology;  Laterality: N/A;  . ENDOSCOPIC MUCOSAL RESECTION N/A 08/26/2019   Procedure: ENDOSCOPIC MUCOSAL RESECTION;  Surgeon: Yetta Flock, MD;  [facility];  Service: Gastroenterology;  Laterality: N/A;  . INGUINAL HERNIA REPAIR Bilateral 05/21/2014   Procedure: OPEN BILATERAL INGUINAL HERNIA REPAIRS WITH MESH;  Surgeon: Donnie Mesa, MD;  Location: [facility];  Service: General;  Laterality: Bilateral;  . POLYPECTOMY  03/25/2019    Procedure: POLYPECTOMY;  Surgeon: Yetta Flock, MD;  Location: [facility];  Service: Gastroenterology;;  . POLYPECTOMY  08/26/2019   Procedure: POLYPECTOMY;  Surgeon: Yetta Flock, MD;  [facility];  Service: Gastroenterology;;  . Woodward Ku  08/26/2019   Procedure: Sprayed methylene blue;  Surgeon: Yetta Flock, MD;  [facility];  Service: Gastroenterology;;  . SMALL INTESTINE SURGERY  1968, 2001    related to Chrohn's disease  . TONSILLECTOMY      Current Outpatient Medications  Medication Sig Dispense Refill  . acetaminophen (TYLENOL) 500 MG tablet Take 500-1,000 mg by mouth every 8 (eight) hours as needed for mild pain or headache.     . Ascorbic Acid (VITAMIN C) 1000 MG tablet Take 1,000 mg by mouth daily with breakfast.     . aspirin 81 MG chewable tablet Chew 1 tablet (81 mg total) by mouth daily. 90 tablet 0  . 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     . brimonidine (ALPHAGAN) 0.2 % ophthalmic solution Place 1 drop into the left eye See admin instructions. Instill 1 drop into the left eye twice a day- 6:45 AM and 5:50 PM    . budesonide (ENTOCORT EC) 3 MG 24 hr capsule Take by mouth as needed.    . Calcium Carbonate-Vit D-Min (CALCIUM 1200 PO) Take 1,200 mg by mouth daily with breakfast.    . Cholecalciferol (VITAMIN D3) 50 MCG (2000 UT) TABS Take 2,000 Units by mouth daily with  breakfast.    . cyanocobalamin (,VITAMIN B-12,) 1000 MCG/ML injection INJECT 1 ML INTO THE MUSCLE EVERY 30 DAYS 10 mL 1  . dorzolamide-timolol (COSOPT) 22.3-6.8 MG/ML ophthalmic solution Place 1 drop into the left eye See admin instructions. Instill 1 drop into the left eye twice a day- 6:35 AM and 5:30 PM    . ferrous sulfate 325 (65 FE) MG tablet Take 325 mg by mouth every Monday, Wednesday, and Friday.     . latanoprost (XALATAN) 0.005 % ophthalmic  solution Place 1 drop into the left eye See admin instructions. Instill 1 drop into the left eye at 6:10 PM daily    . loperamide (IMODIUM A-D) 2 MG tablet Take 2 mg by mouth daily.     Mckinley Jewel Dimesylate (RHOPRESSA) 0.02 % SOLN Place 1 drop into the left eye See admin instructions. Instill 1 drop into the left eye at 6:15 PM daily    . omeprazole (PRILOSEC OTC) 20 MG tablet Take 20 mg by mouth daily as needed (for reflux symptoms).    . prednisoLONE acetate (PRED FORTE) 1 % ophthalmic suspension Place 1 drop into the right eye See admin instructions. Instill 1 drop into the right eye four times a day- 7:00 AM, 12:00 noon, 4:00 PM, and 6:00 PM    . Syringe/Needle, Disp, (SYRINGE 3CC/25GX1") 25G X 1" 3 ML MISC Use for monthly B12 injections 12 each 0   No current facility-administered medications for this visit.    No Known Allergies  Social History   Socioeconomic History  . Marital status: Married    Spouse name: Not on file  . Number of children: 3  . Years of education: Not on file  . Highest education level: Not on file  Occupational History  . Occupation: Retired  Tobacco Use  . Smoking status: Former Smoker    Quit date: 04/01/1976    Years since quitting: 43.6  . Smokeless tobacco: Never Used  Vaping Use  . Vaping Use: Never used  Substance and Sexual Activity  . Alcohol use: Yes    Comment: 3-4  glass wine  weekly  . Drug use: No  . Sexual activity: Never  Other Topics Concern  . Not on file  Social History Narrative  . Not on file   Social Determinants of Health   Financial Resource Strain:   . Difficulty of Paying Living Expenses: Not on file  Food Insecurity:   . Worried About Charity fundraiser in the Last Year: Not on file  . Ran Out of Food in the Last Year: Not on file  Transportation Needs:   . Lack of Transportation (Medical): Not on file  . Lack of Transportation (Non-Medical): Not on file  Physical Activity:   . Days of Exercise per Week: Not  on file  . Minutes of Exercise per Session: Not on file  Stress:   . Feeling of Stress : Not on file  Social Connections:   . Frequency of Communication with Friends and Family: Not on file  . Frequency of Social Gatherings with Friends and Family: Not on file  . Attends Religious Services: Not on file  . Active Member of Clubs or Organizations: Not on file  . Attends Archivist Meetings: Not on file  . Marital Status: Not on file  Intimate Partner Violence:   . Fear of Current or Ex-Partner: Not on file  . Emotionally Abused: Not on file  . Physically Abused: Not on file  .  Sexually Abused: Not on file    Family History  Problem Relation Age of Onset  . Deep vein thrombosis Mother   . Heart disease Father   . Stomach cancer Neg Hx   . Colon cancer Neg Hx   . Esophageal cancer Neg Hx   . Pancreatic cancer Neg Hx   . Rectal cancer Neg Hx   . Colon polyps Neg Hx     Review of Systems:  As stated in the HPI and otherwise negative.   BP 126/76   Pulse 77   Ht 5' 11"  (1.803 m)   Wt 181 lb 9.6 oz (82.4 kg)   SpO2 97%   BMI 25.33 kg/m   Physical Examination: General: Well developed, well nourished, NAD  HEENT: OP clear, mucus membranes moist  SKIN: warm, dry. No rashes. Neuro: No focal deficits  Musculoskeletal: Muscle strength 5/5 all ext  Psychiatric: Mood and affect normal  Neck: No JVD, no carotid bruits, no thyromegaly, no lymphadenopathy.  Lungs:Clear bilaterally, no wheezes, rhonci, crackles Cardiovascular: Regular rate and rhythm. No murmurs, gallops or rubs. Abdomen:Soft. Bowel sounds present. Non-tender.  Extremities: No lower extremity edema. Pulses are 2 + in the bilateral DP/PT.  EKG:  EKG is not ordered today. The ekg ordered today demonstrates  EKG from 09/11/19 reviewed and shows sinus  Echo September 2021: 1. Left ventricular ejection fraction, by estimation, is 55 to 60%. The  left ventricle has normal function. The left ventricle has no  regional  wall motion abnormalities. Left ventricular diastolic parameters are  consistent with Grade I diastolic  dysfunction (impaired relaxation).  2. Right ventricular systolic function is normal. The right ventricular  size is normal. There is normal pulmonary artery systolic pressure.  3. Left atrial size was mildly dilated.  4. The mitral valve is normal in structure. No evidence of mitral valve  regurgitation. No evidence of mitral stenosis.  5. The aortic valve is normal in structure. Aortic valve regurgitation is  not visualized. No aortic stenosis is present.  6. The inferior vena cava is normal in size with greater than 50%  respiratory variability, suggesting right atrial pressure of 3 mmHg.  7. Agitated saline contrast bubble study was negative, with no evidence  of any interatrial shunt.   Recent Labs: 09/11/2019: ALT 10; BUN 14; Creatinine, Ser 1.00; Hemoglobin 11.6; Platelets 167; Potassium 4.0; Sodium 139   Lipid Panel    Component Value Date/Time   CHOL 117 09/12/2019 0257   TRIG 143 09/12/2019 0257   HDL 34 (L) 09/12/2019 0257   CHOLHDL 3.4 09/12/2019 0257   VLDL 29 09/12/2019 0257   LDLCALC 54 09/12/2019 0257   LDLDIRECT 60.6 09/11/2019 1401     Wt Readings from Last 3 Encounters:  11/25/19 181 lb 9.6 oz (82.4 kg)  11/19/19 183 lb (83 kg)  10/11/19 180 lb (81.6 kg)    Assessment and Plan:   1. TIA: Echo normal. Carotid artery dopplers with no obstructive disease. Will arrange a 30 day event monitor to exclude arhythmias.   Current medicines are reviewed at length with the patient today.  The patient does not have concerns regarding medicines.  The following changes have been made:  no change  Labs/ tests ordered today include:   Orders Placed This Encounter  Procedures  . Cardiac event monitor     Disposition:   FU with me in 3 months.    Signed, Lauree Chandler, MD 11/25/2019 10:26 AM    [facility]  [address], [place], [place]  [address] Phone: [phone]; Fax: [phone]

## 2019-11-25 NOTE — Progress Notes (Signed)
Patient ID: Michael [name], male   DOB: September 29, 1932, 86 y.o.   MRN: [id number] Patient enrolled for [employer] to ship a 30 day cardiac event monitor to his home.

## 2019-11-26 ENCOUNTER — Ambulatory Visit: Payer: Medicare Other | Admitting: Gastroenterology

## 2019-12-09 ENCOUNTER — Encounter: Payer: Medicare Other | Admitting: Counselor

## 2019-12-12 ENCOUNTER — Encounter: Payer: Self-pay | Admitting: Family Medicine

## 2019-12-12 ENCOUNTER — Telehealth (INDEPENDENT_AMBULATORY_CARE_PROVIDER_SITE_OTHER): Payer: Medicare Other | Admitting: Family Medicine

## 2019-12-12 DIAGNOSIS — R0981 Nasal congestion: Secondary | ICD-10-CM | POA: Diagnosis not present

## 2019-12-12 DIAGNOSIS — R059 Cough, unspecified: Secondary | ICD-10-CM

## 2019-12-12 MED ORDER — BENZONATATE 100 MG PO CAPS: 100.0000 mg | ORAL_CAPSULE | Freq: Three times a day (TID) | ORAL | 0 refills | Status: DC | PRN

## 2019-12-12 MED ORDER — ALBUTEROL SULFATE HFA 108 (90 BASE) MCG/ACT IN AERS: 2.0000 | INHALATION_SPRAY | Freq: Four times a day (QID) | RESPIRATORY_TRACT | 0 refills | Status: DC | PRN

## 2019-12-12 NOTE — Progress Notes (Signed)
. Virtual Visit via Video Note  I connected with Michael [name]  on 12/12/19 at 11:40 AM EST by a video enabled telemedicine application and verified that I am speaking with the correct person using two identifiers.  Location patient: home, [place] Location provider:work or home office Persons participating in the virtual visit: patient, provider, wife  I discussed the limitations of evaluation and management by telemedicine and the availability of in person appointments. The patient expressed understanding and agreed to proceed.   HPI:  Acute telemedicine visit for cough: -Onset: 4-5 days ago -Symptoms include: cough, sinus congestion, wheezing -but reports is rare and very mild and usually only with coughing -had negative covid test yesterday -one granddaughter had a cold recently -Denies: fever, SOB, CP, NVD, bodyaches -Has tried: musinex -Pertinent past medical history:see below -Pertinent medication allergies: nkda -COVID-19 vaccine status: vaccinated for covid with 3 dose and had flu shot  ROS: See pertinent positives and negatives per HPI.  Past Medical History:  Diagnosis Date  . Adrenal insufficiency ([place])   . Anemia   . Avascular necrosis ([place])   . Colon polyp   . Crohn's disease ([place])   . Glaucoma   . Inguinal hernia recurrent bilateral   . Low testosterone   . Osteopenia   . Retinal ischemia     Past Surgical History:  Procedure Laterality Date  . ABDOMINAL SURGERY    . COLONOSCOPY N/A 12/13/2015   Procedure: COLONOSCOPY;  Surgeon: Laurence Spates, MD;  [facility];  Service: Endoscopy;  Laterality: N/A;  . COLONOSCOPY WITH PROPOFOL N/A 03/25/2019   Procedure: COLONOSCOPY WITH PROPOFOL;  Surgeon: Yetta Flock, MD;  [facility];  Service: Gastroenterology;  Laterality: N/A;  . COLONOSCOPY WITH PROPOFOL N/A 08/26/2019   Procedure: COLONOSCOPY WITH PROPOFOL;  Surgeon: Yetta Flock, MD;  [facility];  Service: Gastroenterology;   Laterality: N/A;  . ENDOSCOPIC MUCOSAL RESECTION N/A 08/26/2019   Procedure: ENDOSCOPIC MUCOSAL RESECTION;  Surgeon: Yetta Flock, MD;  [facility];  Service: Gastroenterology;  Laterality: N/A;  . INGUINAL HERNIA REPAIR Bilateral 05/21/2014   Procedure: OPEN BILATERAL INGUINAL HERNIA REPAIRS WITH MESH;  Surgeon: Donnie Mesa, MD;  Location: [facility];  Service: General;  Laterality: Bilateral;  . POLYPECTOMY  03/25/2019   Procedure: POLYPECTOMY;  Surgeon: Yetta Flock, MD;  [facility];  Service: Gastroenterology;;  . POLYPECTOMY  08/26/2019   Procedure: POLYPECTOMY;  Surgeon: Yetta Flock, MD;  [facility];  Service: Gastroenterology;;  . Woodward Ku  08/26/2019   Procedure: Sprayed methylene blue;  Surgeon: Yetta Flock, MD;  [facility];  Service: Gastroenterology;;  . SMALL INTESTINE SURGERY  1968, 2001    related to Chrohn's disease  . TONSILLECTOMY       Current Outpatient Medications:  .  acetaminophen (TYLENOL) 500 MG tablet, Take 500-1,000 mg by mouth every 8 (eight) hours as needed for mild pain or headache. , Disp: , Rfl:  .  albuterol (PROAIR HFA) 108 (90 Base) MCG/ACT inhaler, Inhale 2 puffs into the lungs every 6 (six) hours as needed for wheezing or shortness of breath., Disp: 1 each, Rfl: 0 .  Ascorbic Acid (VITAMIN C) 1000 MG tablet, Take 1,000 mg by mouth daily with breakfast. , Disp: , Rfl:  .  aspirin 81 MG chewable tablet, Chew 1 tablet (81 mg total) by mouth daily., Disp: 90 tablet, Rfl: 0 .  augmented betamethasone dipropionate (DIPROLENE-AF) 0.05 % cream, Apply 1 application topically 2 (two)  times daily as needed (Grover's disease). , Disp: , Rfl:  .  benzonatate (TESSALON PERLES) 100 MG capsule, Take 1 capsule (100 mg total) by mouth 3 (three) times daily as needed., Disp: 20 capsule, Rfl: 0 .  brimonidine (ALPHAGAN) 0.2 % ophthalmic solution, Place 1 drop into the left eye  See admin instructions. Instill 1 drop into the left eye twice a day- 6:45 AM and 5:50 PM, Disp: , Rfl:  .  budesonide (ENTOCORT EC) 3 MG 24 hr capsule, Take by mouth as needed., Disp: , Rfl:  .  Calcium Carbonate-Vit D-Min (CALCIUM 1200 PO), Take 1,200 mg by mouth daily with breakfast., Disp: , Rfl:  .  Cholecalciferol (VITAMIN D3) 50 MCG (2000 UT) TABS, Take 2,000 Units by mouth daily with breakfast., Disp: , Rfl:  .  cyanocobalamin (,VITAMIN B-12,) 1000 MCG/ML injection, INJECT 1 ML INTO THE MUSCLE EVERY 30 DAYS, Disp: 10 mL, Rfl: 1 .  dorzolamide-timolol (COSOPT) 22.3-6.8 MG/ML ophthalmic solution, Place 1 drop into the left eye See admin instructions. Instill 1 drop into the left eye twice a day- 6:35 AM and 5:30 PM, Disp: , Rfl:  .  ferrous sulfate 325 (65 FE) MG tablet, Take 325 mg by mouth every Monday, Wednesday, and Friday. , Disp: , Rfl:  .  latanoprost (XALATAN) 0.005 % ophthalmic solution, Place 1 drop into the left eye See admin instructions. Instill 1 drop into the left eye at 6:10 PM daily, Disp: , Rfl:  .  loperamide (IMODIUM A-D) 2 MG tablet, Take 2 mg by mouth daily. , Disp: , Rfl:  .  Netarsudil Dimesylate (RHOPRESSA) 0.02 % SOLN, Place 1 drop into the left eye See admin instructions. Instill 1 drop into the left eye at 6:15 PM daily, Disp: , Rfl:  .  omeprazole (PRILOSEC OTC) 20 MG tablet, Take 20 mg by mouth daily as needed (for reflux symptoms)., Disp: , Rfl:  .  prednisoLONE acetate (PRED FORTE) 1 % ophthalmic suspension, Place 1 drop into the right eye See admin instructions. Instill 1 drop into the right eye four times a day- 7:00 AM, 12:00 noon, 4:00 PM, and 6:00 PM, Disp: , Rfl:  .  Syringe/Needle, Disp, (SYRINGE 3CC/25GX1") 25G X 1" 3 ML MISC, Use for monthly B12 injections, Disp: 12 each, Rfl: 0  EXAM:  VITALS per patient if applicable:  GENERAL: alert, oriented, appears well and in no acute distress  HEENT: atraumatic, conjunttiva clear, no obvious abnormalities on  inspection of external nose and ears  NECK: normal movements of the head and neck  LUNGS: on inspection no signs of respiratory distress, breathing rate appears normal, no obvious gross SOB, gasping or wheezing  CV: no obvious cyanosis  MS: moves all visible extremities without noticeable abnormality  PSYCH/NEURO: pleasant and cooperative, no obvious depression or anxiety, speech and thought processing grossly intact  ASSESSMENT AND PLAN:  Discussed the following assessment and plan:  Cough  Nasal congestion  -we discussed possible serious and likely etiologies, options for evaluation and workup, limitations of telemedicine visit vs in person visit, treatment, treatment risks and precautions. Pt prefers to treat via telemedicine empirically rather than in person at this moment.  Query viral upper respiratory illness versus other.  Possible bronchitis.  He opted for treatment with Tessalon prescription for cough and albuterol as needed if any wheezing, the reports is rare and seems to only happen occasionally with cough.  Other over-the-counter symptomatic care options discussed and patient instructions.  He tested negative for Covid  and is fully vaccinated. Scheduled follow up with PCP offered: Declined, agrees to follow-up if needed Advised to seek prompt in person care if worsening, new symptoms arise, or if is not improving with treatment. Discussed options for inperson care if PCP office not available. Did let this patient know that I only do telemedicine on Tuesdays and Thursdays for [place]. Advised to schedule follow up visit with PCP or [facility] if any further questions or concerns to avoid delays in care.   I discussed the assessment and treatment plan with the patient. The patient was provided an opportunity to ask questions and all were answered. The patient agreed with the plan and demonstrated an understanding of the instructions.     Lucretia Kern, DO

## 2019-12-12 NOTE — Patient Instructions (Signed)
-  I sent the medication(s) we discussed to your pharmacy: Meds ordered this encounter  Medications  . benzonatate (TESSALON PERLES) 100 MG capsule    Sig: Take 1 capsule (100 mg total) by mouth 3 (three) times daily as needed.    Dispense:  20 capsule    Refill:  0  . albuterol (PROAIR HFA) 108 (90 Base) MCG/ACT inhaler    Sig: Inhale 2 puffs into the lungs every 6 (six) hours as needed for wheezing or shortness of breath.    Dispense:  1 each    Refill:  0   Can use nasal saline if needed for nasal congestion.  Warm herbal tea with lemon and honey may be helpful.  Eat a healthy diet, stay hydrated and avoid dairy while sick.  I hope you are feeling better soon!  Seek in person care promptly if your symptoms worsen, new concerns arise or you are not improving with treatment.  It was nice to meet you today. I help [facility] out with telemedicine visits on Tuesdays and Thursdays and am available for visits on those days. If you have any concerns or questions following this visit please schedule a follow up visit with your Primary Care doctor or seek care at a local urgent care clinic to avoid delays in care.

## 2019-12-16 ENCOUNTER — Telehealth: Payer: Self-pay | Admitting: Cardiovascular Disease

## 2019-12-16 NOTE — Telephone Encounter (Signed)
Patient was enrolled 11/25/2019 for [employer] to ship a 30 day cardiac event monitor to his home.  Enrollment still listed on website as pending.  This could be because [employer] had not been able to reach patient to verify shipping address.  Gave Ms. Hunley [employer] number to contact directly to verify shipping address.   Instructions sent to patient via My Chart message.

## 2019-12-16 NOTE — Telephone Encounter (Signed)
Michael [name] is calling stating they never received Michael [name]'s heart monitor. Please advise.

## 2019-12-19 DIAGNOSIS — H401133 Primary open-angle glaucoma, bilateral, severe stage: Secondary | ICD-10-CM | POA: Diagnosis not present

## 2019-12-20 DIAGNOSIS — R3915 Urgency of urination: Secondary | ICD-10-CM | POA: Diagnosis not present

## 2019-12-23 ENCOUNTER — Encounter: Payer: Self-pay | Admitting: Cardiovascular Disease

## 2019-12-23 DIAGNOSIS — G459 Transient cerebral ischemic attack, unspecified: Secondary | ICD-10-CM | POA: Diagnosis not present

## 2019-12-23 DIAGNOSIS — I4891 Unspecified atrial fibrillation: Secondary | ICD-10-CM | POA: Diagnosis not present

## 2019-12-23 NOTE — Patient Instructions (Addendum)
Blood work was ordered.     No immunization administered today.   Medications changes include :   none    Please followup in 1 year    Health Maintenance, Male Adopting a healthy lifestyle and getting preventive care are important in promoting health and wellness. Ask your health care provider about:  The right schedule for you to have regular tests and exams.  Things you can do on your own to prevent diseases and keep yourself healthy. What should I know about diet, weight, and exercise? Eat a healthy diet   Eat a diet that includes plenty of vegetables, fruits, low-fat dairy products, and lean protein.  Do not eat a lot of foods that are high in solid fats, added sugars, or sodium. Maintain a healthy weight Body mass index (BMI) is a measurement that can be used to identify possible weight problems. It estimates body fat based on height and weight. Your health care provider can help determine your BMI and help you achieve or maintain a healthy weight. Get regular exercise Get regular exercise. This is one of the most important things you can do for your health. Most adults should:  Exercise for at least 150 minutes each week. The exercise should increase your heart rate and make you sweat (moderate-intensity exercise).  Do strengthening exercises at least twice a week. This is in addition to the moderate-intensity exercise.  Spend less time sitting. Even light physical activity can be beneficial. Watch cholesterol and blood lipids Have your blood tested for lipids and cholesterol at 84 years of age, then have this test every 5 years. You may need to have your cholesterol levels checked more often if:  Your lipid or cholesterol levels are high.  You are older than 84 years of age.  You are at high risk for heart disease. What should I know about cancer screening? Many types of cancers can be detected early and may often be prevented. Depending on your health history  and family history, you may need to have cancer screening at various ages. This may include screening for:  Colorectal cancer.  Prostate cancer.  Skin cancer.  Lung cancer. What should I know about heart disease, diabetes, and high blood pressure? Blood pressure and heart disease  High blood pressure causes heart disease and increases the risk of stroke. This is more likely to develop in people who have high blood pressure readings, are of African descent, or are overweight.  Talk with your health care provider about your target blood pressure readings.  Have your blood pressure checked: ? Every 3-5 years if you are 45-35 years of age. ? Every year if you are 57 years old or older.  If you are between the ages of 36 and 60 and are a current or former smoker, ask your health care provider if you should have a one-time screening for abdominal aortic aneurysm (AAA). Diabetes Have regular diabetes screenings. This checks your fasting blood sugar level. Have the screening done:  Once every three years after age 59 if you are at a normal weight and have a low risk for diabetes.  More often and at a younger age if you are overweight or have a high risk for diabetes. What should I know about preventing infection? Hepatitis B If you have a higher risk for hepatitis B, you should be screened for this virus. Talk with your health care provider to find out if you are at risk for hepatitis B infection. Hepatitis C  Blood testing is recommended for:  Everyone born from [birth year] through 1963.  Anyone with known risk factors for hepatitis C. Sexually transmitted infections (STIs)  You should be screened each year for STIs, including gonorrhea and chlamydia, if: ? You are sexually active and are younger than 84 years of age. ? You are older than 84 years of age and your health care provider tells you that you are at risk for this type of infection. ? Your sexual activity has changed since you were  last screened, and you are at increased risk for chlamydia or gonorrhea. Ask your health care provider if you are at risk.  Ask your health care provider about whether you are at high risk for HIV. Your health care provider may recommend a prescription medicine to help prevent HIV infection. If you choose to take medicine to prevent HIV, you should first get tested for HIV. You should then be tested every 3 months for as long as you are taking the medicine. Follow these instructions at home: Lifestyle  Do not use any products that contain nicotine or tobacco, such as cigarettes, e-cigarettes, and chewing tobacco. If you need help quitting, ask your health care provider.  Do not use street drugs.  Do not share needles.  Ask your health care provider for help if you need support or information about quitting drugs. Alcohol use  Do not drink alcohol if your health care provider tells you not to drink.  If you drink alcohol: ? Limit how much you have to 0-2 drinks a day. ? Be aware of how much alcohol is in your drink. In the [place] one drink equals one 12 oz bottle of beer (355 mL), one 5 oz glass of wine (148 mL), or one 1 oz glass of hard liquor (44 mL). General instructions  Schedule regular health, dental, and eye exams.  Stay current with your vaccines.  Tell your health care provider if: ? You often feel depressed. ? You have ever been abused or do not feel safe at home. Summary  Adopting a healthy lifestyle and getting preventive care are important in promoting health and wellness.  Follow your health care provider's instructions about healthy diet, exercising, and getting tested or screened for diseases.  Follow your health care provider's instructions on monitoring your cholesterol and blood pressure. This information is not intended to replace advice given to you by your health care provider. Make sure you discuss any questions you have with your health care  provider. Document Revised: 12/20/2017 Document Reviewed: 12/20/2017 Elsevier Patient Education  2020 [employer].

## 2019-12-23 NOTE — Progress Notes (Signed)
Subjective:    Patient ID: Michael [name], male    DOB: Jul 27, 1932, 86 y.o.   MRN: [id number]  HPI He is here for a physical exam.   He had a cold and has a mild residual cough.  It is improving.  Overall he feels well and has no concerns.  He has seen urology recently and was started on Flomax, which he will start today.  He does have very frequent urination.  He is following with cardiology and just today he is starting to wear heart monitor.  He follows with Dr. Havery Moros and his colitis is currently controlled.  He also sees podiatry, dermatology and ophthalmology.  He is up-to-date with all his visits.  Medications and allergies reviewed with patient and updated if appropriate.  Patient Active Problem List   Diagnosis Date Noted  . Aortic atherosclerosis ([place]) 12/24/2019  . TIA (transient ischemic attack) 09/11/2019  . History of colonic polyps   . Benign neoplasm of colon   . Overactive bladder 05/06/2019  . Memory changes 05/06/2019  . Polyp of large intestine   . Asymptomatic varicose veins of left lower extremity 12/16/2017  . Hyperglycemia 11/02/2017  . 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 Clavicle fracture 11/02/2017  . Vitamin D deficiency 11/02/2017  . History of DVT of lower extremity, left leg 07/2017 08/08/2017  . SBO (small bowel obstruction) ([place]) 07/05/2017  . Bilateral hearing loss 05/31/2017  . B12 deficiency 02/06/2017  . Osteopenia 02/06/2017  . Glaucoma 12/12/2015  . Crohn's disease ([place]) 12/12/2015  . Anemia 12/12/2015  . 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 acetaminophen (TYLENOL) 500 MG tablet Take 500-1,000 mg by mouth every 8 (eight) hours as needed for mild pain or headache.     . Ascorbic Acid (VITAMIN C) 1000 MG tablet Take 1,000 mg by mouth daily with breakfast.     . augmented betamethasone dipropionate (DIPROLENE-AF) 0.05 % cream Apply 1 application  topically 2 (two) times daily as needed (Grover's disease).     . brimonidine (ALPHAGAN) 0.2 % ophthalmic solution Place 1 drop into the left eye See admin instructions. Instill 1 drop into the left eye twice a day- 6:45 AM and 5:50 PM    . budesonide (ENTOCORT EC) 3 MG 24 hr capsule Take by mouth as needed.    . Calcium Carbonate-Vit D-Min (CALCIUM 1200 PO) Take 1,200 mg by mouth daily with breakfast.    . Cholecalciferol (VITAMIN D3) 50 MCG (2000 UT) TABS Take 2,000 Units by mouth daily with breakfast.    . cyanocobalamin (,VITAMIN B-12,) 1000 MCG/ML injection INJECT 1 ML INTO THE MUSCLE EVERY 30 DAYS 10 mL 1  . dorzolamide-timolol (COSOPT) 22.3-6.8 MG/ML ophthalmic solution Place 1 drop into the left eye See admin instructions. Instill 1 drop into the left eye twice a day- 6:35 AM and 5:30 PM    . ferrous sulfate 325 (65 FE) MG tablet Take 325 mg by mouth every Monday, Wednesday, and Friday.     . latanoprost (XALATAN) 0.005 % ophthalmic solution Place 1 drop into the left eye See admin instructions. Instill 1 drop into the left eye at 6:10 PM daily    . loperamide (IMODIUM A-D) 2 MG tablet Take 2 mg by mouth daily.    . Netarsudil Dimesylate 0.02 % SOLN Place 1 drop into the left eye See admin instructions. Instill 1 drop into the left eye at 6:15  PM daily    . omeprazole (PRILOSEC OTC) 20 MG tablet Take 20 mg by mouth daily as needed (for reflux symptoms).    . prednisoLONE acetate (PRED FORTE) 1 % ophthalmic suspension Place 1 drop into the right eye See admin instructions. Instill 1 drop into the right eye four times a day- 7:00 AM, 12:00 noon, 4:00 PM, and 6:00 PM    . Syringe/Needle, Disp, (SYRINGE 3CC/25GX1") 25G X 1" 3 ML MISC Use for monthly B12 injections 12 each 0  . tamsulosin (FLOMAX) 0.4 MG CAPS capsule Take 0.4 mg by mouth daily.     No current facility-administered medications on file prior to visit.    Past Medical History:  Diagnosis Date  . Adrenal insufficiency ([place])   .  Anemia   . Avascular necrosis ([place])   . Colon polyp   . Crohn's disease ([place])   . Glaucoma   . Inguinal hernia recurrent bilateral   . Low testosterone   . Osteopenia   . Retinal ischemia     Past Surgical History:  Procedure Laterality Date  . ABDOMINAL SURGERY    . COLONOSCOPY N/A 12/13/2015   Procedure: COLONOSCOPY;  Surgeon: Laurence Spates, MD;  Location: [facility];  Service: Endoscopy;  Laterality: N/A;  . COLONOSCOPY WITH PROPOFOL N/A 03/25/2019   Procedure: COLONOSCOPY WITH PROPOFOL;  Surgeon: Yetta Flock, MD;  Location: [facility];  Service: Gastroenterology;  Laterality: N/A;  . COLONOSCOPY WITH PROPOFOL N/A 08/26/2019   Procedure: COLONOSCOPY WITH PROPOFOL;  Surgeon: Yetta Flock, MD;  Location: [facility];  Service: Gastroenterology;  Laterality: N/A;  . ENDOSCOPIC MUCOSAL RESECTION N/A 08/26/2019   Procedure: ENDOSCOPIC MUCOSAL RESECTION;  Surgeon: Yetta Flock, MD;  Location: [facility];  Service: Gastroenterology;  Laterality: N/A;  . INGUINAL HERNIA REPAIR Bilateral 05/21/2014   Procedure: OPEN BILATERAL INGUINAL HERNIA REPAIRS WITH MESH;  Surgeon: Donnie Mesa, MD;  Location: [facility];  Service: General;  Laterality: Bilateral;  . POLYPECTOMY  03/25/2019   Procedure: POLYPECTOMY;  Surgeon: Yetta Flock, MD;  Location: [facility];  Service: Gastroenterology;;  . POLYPECTOMY  08/26/2019   Procedure: POLYPECTOMY;  Surgeon: Yetta Flock, MD;  Location: [facility];  Service: Gastroenterology;;  . Woodward Ku  08/26/2019   Procedure: Sprayed methylene blue;  Surgeon: Yetta Flock, MD;  Location: [facility];  Service: Gastroenterology;;  . SMALL INTESTINE SURGERY  1968, 2001    related to Chrohn's disease  . TONSILLECTOMY      Social History   Socioeconomic History  . Marital status: Married    Spouse name: Not on file  . Number of children: 3  . Years of education: Not on file  . Highest  education level: Not on file  Occupational History  . Occupation: Retired  Tobacco Use  . Smoking status: Former Smoker    Quit date: 04/01/1976    Years since quitting: 43.7  . Smokeless tobacco: Never Used  Vaping Use  . Vaping Use: Never used  Substance and Sexual Activity  . Alcohol use: Yes    Comment: 3-4  glass wine  weekly  . Drug use: No  . Sexual activity: Never  Other Topics Concern  . Not on file  Social History Narrative  . Not on file   Social Determinants of Health   Financial Resource Strain: Not on file  Food Insecurity: Not on file  Transportation Needs: Not on file  Physical Activity: Not on file  Stress: Not on file  Social Connections: Not on file    Family History  Problem Relation Age of Onset  . Deep vein thrombosis Mother   . Heart disease Father   . Stomach cancer Neg Hx   . Colon cancer Neg Hx   . Esophageal cancer Neg Hx   . Pancreatic cancer Neg Hx   . Rectal cancer Neg Hx   . Colon polyps Neg Hx     Review of Systems  Constitutional: Negative for chills and fever.  Eyes: Negative for visual disturbance.  Respiratory: Positive for cough (minimal - residual from URI). Negative for chest tightness, shortness of breath and wheezing.   Cardiovascular: Negative for chest pain, palpitations and leg swelling.  Gastrointestinal: Negative for abdominal pain, blood in stool, constipation, diarrhea and nausea.       No gerd  Genitourinary: Positive for frequency. Negative for dysuria and hematuria.  Musculoskeletal: Negative for arthralgias and back pain.  Skin: Negative for rash.  Neurological: Negative for light-headedness and headaches.  Psychiatric/Behavioral: Negative for dysphoric mood. The patient is not nervous/anxious.        Objective:   Vitals:   12/24/19 0937  BP: 120/68  Pulse: 65  Temp: 98.3 F (36.8 C)  SpO2: 99%   Filed Weights   12/24/19 0937  Weight: 182 lb 6.4 oz (82.7 kg)   Body mass index is 25.44  kg/m.  BP Readings from Last 3 Encounters:  12/24/19 120/68  11/25/19 126/76  11/19/19 120/82    Wt Readings from Last 3 Encounters:  12/24/19 182 lb 6.4 oz (82.7 kg)  11/25/19 181 lb 9.6 oz (82.4 kg)  11/19/19 183 lb (83 kg)     Physical Exam Constitutional: He appears well-developed and well-nourished. No distress.  HENT:  Head: Normocephalic and atraumatic.  Right Ear: External ear normal.  Left Ear: External ear normal.  Mouth/Throat: Oropharynx is clear and moist.  Normal ear canals and TM b/l  Eyes: Conjunctivae and EOM are normal.  Neck: Neck supple. No tracheal deviation present. No thyromegaly present.  No carotid bruit  Cardiovascular: Normal rate, regular rhythm, normal heart sounds and intact distal pulses.   1/6 systolic murmur heard. Pulmonary/Chest: Effort normal and breath sounds normal. No respiratory distress. He has no wheezes. He has no rales.  Abdominal: Soft. Ventral hernia-nontender. He exhibits no distension. There is no tenderness.  Genitourinary: deferred  Musculoskeletal: He exhibits no edema.  Lymphadenopathy:   He has no cervical adenopathy.  Skin: Skin is warm and dry. He is not diaphoretic.  Psychiatric: He has a normal mood and affect. His behavior is normal.         Assessment & Plan:   Physical exam: Screening blood work  ordered Immunizations  Up to date  Colonoscopy   N/a  Eye exams   Up to date  Dexa   Up to date  Exercise   Walking, plays tennis in warmer weather Weight  Good for age Substance abuse   none  See Problem List for Assessment and Plan of chronic medical problems.   This visit occurred during the SARS-CoV-2 public health emergency.  Safety protocols were in place, including screening questions prior to the visit, additional usage of staff PPE, and extensive cleaning of exam room while observing appropriate contact time as indicated for disinfecting solutions.   Follow-up in 1 year

## 2019-12-24 ENCOUNTER — Ambulatory Visit (INDEPENDENT_AMBULATORY_CARE_PROVIDER_SITE_OTHER): Payer: Medicare Other | Admitting: Internal Medicine

## 2019-12-24 ENCOUNTER — Encounter: Payer: Self-pay | Admitting: Internal Medicine

## 2019-12-24 ENCOUNTER — Other Ambulatory Visit: Payer: Self-pay

## 2019-12-24 VITALS — BP 120/68 | HR 65 | Temp 98.3°F | Ht 71.0 in | Wt 182.4 lb

## 2019-12-24 DIAGNOSIS — E559 Vitamin D deficiency, unspecified: Secondary | ICD-10-CM

## 2019-12-24 DIAGNOSIS — M85851 Other specified disorders of bone density and structure, right thigh: Secondary | ICD-10-CM | POA: Diagnosis not present

## 2019-12-24 DIAGNOSIS — Z0001 Encounter for general adult medical examination with abnormal findings: Secondary | ICD-10-CM

## 2019-12-24 DIAGNOSIS — K50012 Crohn's disease of small intestine with intestinal obstruction: Secondary | ICD-10-CM | POA: Diagnosis not present

## 2019-12-24 DIAGNOSIS — E538 Deficiency of other specified B group vitamins: Secondary | ICD-10-CM

## 2019-12-24 DIAGNOSIS — I700 Atherosclerosis of aorta: Secondary | ICD-10-CM | POA: Insufficient documentation

## 2019-12-24 DIAGNOSIS — R413 Other amnesia: Secondary | ICD-10-CM

## 2019-12-24 DIAGNOSIS — R739 Hyperglycemia, unspecified: Secondary | ICD-10-CM

## 2019-12-24 DIAGNOSIS — N3281 Overactive bladder: Secondary | ICD-10-CM

## 2019-12-24 DIAGNOSIS — M85852 Other specified disorders of bone density and structure, left thigh: Secondary | ICD-10-CM

## 2019-12-24 LAB — COMPREHENSIVE METABOLIC PANEL
ALT: 7 U/L (ref 0–53)
AST: 12 U/L (ref 0–37)
Albumin: 3.9 g/dL (ref 3.5–5.2)
Alkaline Phosphatase: 58 U/L (ref 39–117)
BUN: 15 mg/dL (ref 6–23)
CO2: 29 mEq/L (ref 19–32)
Calcium: 9 mg/dL (ref 8.4–10.5)
Chloride: 102 mEq/L (ref 96–112)
Creatinine, Ser: 1.05 mg/dL (ref 0.40–1.50)
GFR: 64.53 mL/min (ref >60.00)
Glucose, Bld: 85 mg/dL (ref 70–99)
Potassium: 4.4 mEq/L (ref 3.5–5.1)
Sodium: 137 mEq/L (ref 135–145)
Total Bilirubin: 0.7 mg/dL (ref 0.2–1.2)
Total Protein: 7.2 g/dL (ref 6.0–8.3)

## 2019-12-24 LAB — CBC WITH DIFFERENTIAL/PLATELET
Basophils Absolute: 0.1 10*3/uL (ref 0.0–0.1)
Basophils Relative: 0.6 % (ref 0.0–3.0)
Eosinophils Absolute: 0.3 10*3/uL (ref 0.0–0.7)
Eosinophils Relative: 3.9 % (ref 0.0–5.0)
HCT: 35.5 % — ABNORMAL LOW (ref 39.0–52.0)
Hemoglobin: 12.2 g/dL — ABNORMAL LOW (ref 13.0–17.0)
Lymphocytes Relative: 23.3 % (ref 12.0–46.0)
Lymphs Abs: 2 10*3/uL (ref 0.7–4.0)
MCHC: 34.2 g/dL (ref 30.0–36.0)
MCV: 92 fl (ref 78.0–100.0)
Monocytes Absolute: 0.7 10*3/uL (ref 0.1–1.0)
Monocytes Relative: 7.9 % (ref 3.0–12.0)
Neutro Abs: 5.4 10*3/uL (ref 1.4–7.7)
Neutrophils Relative %: 64.3 % (ref 43.0–77.0)
Platelets: 228 10*3/uL (ref 150.0–400.0)
RBC: 3.86 Mil/uL — ABNORMAL LOW (ref 4.22–5.81)
RDW: 13.2 % (ref 11.5–15.5)
WBC: 8.4 10*3/uL (ref 4.0–10.5)

## 2019-12-24 LAB — LIPID PANEL
Cholesterol: 95 mg/dL (ref 0–200)
HDL: 29.7 mg/dL — ABNORMAL LOW (ref >39.00)
LDL Cholesterol: 34 mg/dL (ref 0–99)
NonHDL: 65.75
Total CHOL/HDL Ratio: 3
Triglycerides: 158 mg/dL — ABNORMAL HIGH (ref 0.0–149.0)
VLDL: 31.6 mg/dL (ref 0.0–40.0)

## 2019-12-24 LAB — VITAMIN D 25 HYDROXY (VIT D DEFICIENCY, FRACTURES): VITD: 46.72 ng/mL (ref 30.00–100.00)

## 2019-12-24 LAB — TSH: TSH: 2.11 u[IU]/mL (ref 0.35–4.50)

## 2019-12-24 LAB — HEMOGLOBIN A1C: Hgb A1c MFr Bld: 5.4 % (ref 4.6–6.5)

## 2019-12-24 NOTE — Assessment & Plan Note (Signed)
Chronic Controlled-denies any active symptoms Crohn's Takes budesonide as needed

## 2019-12-24 NOTE — Assessment & Plan Note (Signed)
Chronic He did see Dr. Lavell Anchors and had an EEG that showed no seizures, MMSE 29 Referred for neuropsych testing, but declined further testing.  There was some concern from the neuropsychologist about possible Alzheimer's Continue regular exercise, healthy diet

## 2019-12-24 NOTE — Assessment & Plan Note (Signed)
Chronic Following with Dr. Adrian Prows Just started on Flomax

## 2019-12-24 NOTE — Assessment & Plan Note (Signed)
Chronic DEXA up-to-date He is exercising regularly and will continue Continue calcium and vitamin D daily We will check vitamin D level

## 2019-12-24 NOTE — Assessment & Plan Note (Signed)
Chronic Check A1c Continue regular exercise and low sugar/carbohydrate diet

## 2019-12-24 NOTE — Assessment & Plan Note (Signed)
Chronic Taking B12 daily

## 2019-12-24 NOTE — Assessment & Plan Note (Signed)
Chronic Taking vitamin D daily Check vitamin D level

## 2019-12-24 NOTE — Assessment & Plan Note (Signed)
Chronic Lipid panel at goal without medication so no need for statin He is following a heart healthy diet He is exercising regularly

## 2020-01-01 ENCOUNTER — Other Ambulatory Visit: Payer: Self-pay

## 2020-01-01 ENCOUNTER — Ambulatory Visit (HOSPITAL_COMMUNITY)
Admission: RE | Admit: 2020-01-01 | Discharge: 2020-01-01 | Disposition: A | Discharge status: Home / Self Care | Payer: Medicare Other | Source: Ambulatory Visit | Attending: Nurse Practitioner | Admitting: Nurse Practitioner

## 2020-01-01 ENCOUNTER — Encounter (HOSPITAL_COMMUNITY): Payer: Self-pay | Admitting: Nurse Practitioner

## 2020-01-01 ENCOUNTER — Telehealth: Payer: Self-pay | Admitting: Cardiovascular Disease

## 2020-01-01 ENCOUNTER — Telehealth: Payer: Self-pay | Admitting: Internal Medicine

## 2020-01-01 VITALS — BP 100/70 | HR 132 | Ht 71.0 in | Wt 184.0 lb

## 2020-01-01 DIAGNOSIS — D6869 Other thrombophilia: Secondary | ICD-10-CM

## 2020-01-01 DIAGNOSIS — I4891 Unspecified atrial fibrillation: Secondary | ICD-10-CM | POA: Diagnosis not present

## 2020-01-01 DIAGNOSIS — G459 Transient cerebral ischemic attack, unspecified: Secondary | ICD-10-CM | POA: Diagnosis not present

## 2020-01-01 DIAGNOSIS — Z7901 Long term (current) use of anticoagulants: Secondary | ICD-10-CM | POA: Insufficient documentation

## 2020-01-01 MED ORDER — APIXABAN 5 MG PO TABS: 5.0000 mg | ORAL_TABLET | Freq: Two times a day (BID) | ORAL | 0 refills | Status: DC

## 2020-01-01 NOTE — Telephone Encounter (Signed)
Crystal is calling with a critical EKG report from [facility] was disconnected being transferred.

## 2020-01-01 NOTE — Telephone Encounter (Signed)
Called patient about his monitor and reported event. Patient stated he is riding in the car with his daughter and his wife to [place] to visit his granddaughter. Patient denies any SOB or Chest pain. Patient stated he feels fine. Patient's wife stated patient gets very inpatient and gets a little worked up when someone else is driving. Will consult DOD, Dr. Caryl Comes once strips of rhythm have been faxed.  Dr. Caryl Comes recommends patient to see [facility] tomorrow. Called [facility] they can see patient this afternoon instead. Patient is fine with seeing someone this afternoon. Gave patient's wife instructions to [facility] office and garage code. Gave patient's wife phone number to [facility] just in case they need to call and change appointment.  [facility] CLINIC INFORMATION: Your appointment is scheduled on: 01/01/20 at 3:30 pm The [facility] Clinic is located in the [facility] at [facility]. Parking instructions/directions: [address] (off [address]). When you pull in to Entrance C, there is an underground parking garage to your right. The code to enter the garage is 3007. Take the elevators to the first floor. Follow the signs to the [facility]. You will see registration at the end of the hallway.  Phone number: [phone]

## 2020-01-01 NOTE — Telephone Encounter (Signed)
Thanks. Michael [name]

## 2020-01-01 NOTE — Telephone Encounter (Signed)
Crystal from [facility] reports the patient had an autodetection of afib with RVR at 0721 central time this AM.  Rate 170 bpm.  When asked if the patient broke afib, Crystal reports afib with RVR is the last detection. She will fax results to [phone].  Nursing will call to check on patient.

## 2020-01-01 NOTE — Patient Instructions (Signed)
Start Eliquis 59m - Taking one tablet by mouth twice daily

## 2020-01-01 NOTE — Progress Notes (Signed)
  Chronic Care Management   Outreach Note  01/01/2020 Name: ZAYVIEN CANNING MRN: [id number] DOB: January 08, 1933  Referred by: Binnie Rail, MD Reason for referral : No chief complaint on file.   An unsuccessful telephone outreach was attempted today. The patient was referred to the pharmacist for assistance with care management and care coordination.   Follow Up Plan:   Carley Perdue [employer] Scheduler

## 2020-01-01 NOTE — Telephone Encounter (Signed)
Michael [name] is calling to report a critical EKG.

## 2020-01-02 ENCOUNTER — Encounter (HOSPITAL_COMMUNITY): Payer: Self-pay | Admitting: Nurse Practitioner

## 2020-01-02 NOTE — Progress Notes (Signed)
Primary Care Physician: Michael Rail, MD Referring Physician:Dr.    [name] is a 86 y.o. male with a h/o  TIA, memory loss, DVT, adrenal insufficiency, Crohn's disease here today as a new consult, referred by Dr. Quay [name], for the evaluation of possible arrhythmia.Marland Kitchen He was admitted to [facility] 09/11/19 with a TIA. Carotid artery dopplers 09/11/19 with mild bilateral carotid artery disease. Echo September 2021 with LVEF=55-60%, no valve disease. CT showed probably remote L thalamic lacunar infarctwhich was confirmed on MRI brain. She  was seen by Dr. Julianne [name] 11/25/19, referred by Dr. Quay [name] for consideration of arrhythmia.  There was some delay on getting the monitor but now has been wearing for a few weeks and new onset  afib was noted yesterday  and he was referred to the afib clinic.   EKG shows afib at 132 bpm on presentation, at rest with PO, he is at 70-80 bpm. He is asymptomatic. He currently not on blood thinner but took  eliquis for around 6 months several years ago and did well. He denies a bleeding tendency.CHA2DS2VASc score is 5.  Rate control today is limited for for a systolic BP of 161.   Limited alcohol, no tobacco, snores but wife does not thinks he has apnea. Moderate caffeine. Wife  states he is high strung.   Today, he denies symptoms of palpitations, chest pain, shortness of breath, orthopnea, PND, lower extremity edema, dizziness, presyncope, syncope, or neurologic sequela. The patient is tolerating medications without difficulties and is otherwise without complaint today.   Past Medical History:  Diagnosis Date  . Adrenal insufficiency ([place])   . Anemia   . Avascular necrosis ([place])   . Clavicle fracture 11/02/2017  . Colon polyp   . Crohn's disease ([place])   . Glaucoma   . Inguinal hernia recurrent bilateral   . Low testosterone   . Osteopenia   . Retinal ischemia    Past Surgical History:  Procedure Laterality Date  . ABDOMINAL SURGERY    . COLONOSCOPY N/A  12/13/2015   Procedure: COLONOSCOPY;  Surgeon: Laurence Spates, MD;  [facility];  Service: Endoscopy;  Laterality: N/A;  . COLONOSCOPY WITH PROPOFOL N/A 03/25/2019   Procedure: COLONOSCOPY WITH PROPOFOL;  Surgeon: Yetta Flock, MD;  [facility];  Service: Gastroenterology;  Laterality: N/A;  . COLONOSCOPY WITH PROPOFOL N/A 08/26/2019   Procedure: COLONOSCOPY WITH PROPOFOL;  Surgeon: Yetta Flock, MD;  [facility];  Service: Gastroenterology;  Laterality: N/A;  . ENDOSCOPIC MUCOSAL RESECTION N/A 08/26/2019   Procedure: ENDOSCOPIC MUCOSAL RESECTION;  Surgeon: Yetta Flock, MD;  [facility];  Service: Gastroenterology;  Laterality: N/A;  . INGUINAL HERNIA REPAIR Bilateral 05/21/2014   Procedure: OPEN BILATERAL INGUINAL HERNIA REPAIRS WITH MESH;  Surgeon: Donnie Mesa, MD;  Location: [place];  Service: General;  Laterality: Bilateral;  . POLYPECTOMY  03/25/2019   Procedure: POLYPECTOMY;  Surgeon: Yetta Flock, MD;  Location[facility];  Service: Gastroenterology;;  . POLYPECTOMY  08/26/2019   Procedure: POLYPECTOMY;  Surgeon: Yetta Flock, MD;  [facility];  Service: Gastroenterology;;  . Woodward Ku  08/26/2019   Procedure: Sprayed methylene blue;  Surgeon: Yetta Flock, MD;  [facility];  Service: Gastroenterology;;  . SMALL INTESTINE SURGERY  1968, 2001    related to Chrohn's disease  . TONSILLECTOMY      Current Outpatient Medications  Medication Sig Dispense Refill  . acetaminophen (TYLENOL) 500 MG tablet Take 500-1,000 mg by  mouth every 8 (eight) hours as needed for mild pain or headache.     . Ascorbic Acid (VITAMIN C) 1000 MG tablet Take 1,000 mg by mouth daily with breakfast.     . 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     . brimonidine (ALPHAGAN) 0.2 %  ophthalmic solution Place 1 drop into the left eye See admin instructions. Instill 1 drop into the left eye twice a day- 6:45 AM and 5:50 PM    . budesonide (ENTOCORT EC) 3 MG 24 hr capsule Take by mouth as needed.    . Calcium Carbonate-Vit D-Min (CALCIUM 1200 PO) Take 1,200 mg by mouth daily with breakfast.    . Cholecalciferol (VITAMIN D3) 50 MCG (2000 UT) TABS Take 2,000 Units by mouth daily with breakfast.    . cyanocobalamin (,VITAMIN B-12,) 1000 MCG/ML injection INJECT 1 ML INTO THE MUSCLE EVERY 30 DAYS 10 mL 1  . dorzolamide-timolol (COSOPT) 22.3-6.8 MG/ML ophthalmic solution Place 1 drop into the left eye See admin instructions. Instill 1 drop into the left eye twice a day- 6:35 AM and 5:30 PM    . ferrous sulfate 325 (65 FE) MG tablet Take 325 mg by mouth every Monday, Wednesday, and Friday.     . latanoprost (XALATAN) 0.005 % ophthalmic solution Place 1 drop into the left eye See admin instructions. Instill 1 drop into the left eye at 6:10 PM daily    . loperamide (IMODIUM A-D) 2 MG tablet Take 2 mg by mouth daily.    . Netarsudil Dimesylate 0.02 % SOLN Place 1 drop into the left eye See admin instructions. Instill 1 drop into the left eye at 6:15 PM daily    . omeprazole (PRILOSEC OTC) 20 MG tablet Take 20 mg by mouth daily as needed (for reflux symptoms).    . prednisoLONE acetate (PRED FORTE) 1 % ophthalmic suspension Place 1 drop into the right eye See admin instructions. Instill 1 drop into the right eye four times a day- 7:00 AM, 12:00 noon, 4:00 PM, and 6:00 PM    . Syringe/Needle, Disp, (SYRINGE 3CC/25GX1") 25G X 1" 3 ML MISC Use for monthly B12 injections 12 each 0  . tamsulosin (FLOMAX) 0.4 MG CAPS capsule Take 0.4 mg by mouth daily.    Marland Kitchen apixaban (ELIQUIS) 5 MG TABS tablet Take 1 tablet (5 mg total) by mouth 2 (two) times daily. 180 tablet 0   No current facility-administered medications for this encounter.    No Known Allergies  Social History   Socioeconomic History  .  Marital status: Married    Spouse name: Not on file  . Number of children: 3  . Years of education: Not on file  . Highest education level: Not on file  Occupational History  . Occupation: Retired  Tobacco Use  . Smoking status: Former Smoker    Quit date: 04/01/1976    Years since quitting: 43.7  . Smokeless tobacco: Never Used  Vaping Use  . Vaping Use: Never used  Substance and Sexual Activity  . Alcohol use: Yes    Alcohol/week: 2.0 standard drinks    Types: 2 Glasses of wine per week    Comment: 3-4  glass wine  weekly  . Drug use: No  . Sexual activity: Never  Other Topics Concern  . Not on file  Social History Narrative  . Not on file   Social Determinants of Health  Financial Resource Strain: Not on file  Food Insecurity: Not on file  Transportation Needs: Not on file  Physical Activity: Not on file  Stress: Not on file  Social Connections: Not on file  Intimate Partner Violence: Not on file    Family History  Problem Relation Age of Onset  . Deep vein thrombosis Mother   . Heart disease Father   . Stomach cancer Neg Hx   . Colon cancer Neg Hx   . Esophageal cancer Neg Hx   . Pancreatic cancer Neg Hx   . Rectal cancer Neg Hx   . Colon polyps Neg Hx     ROS- All systems are reviewed and negative except as per the HPI above  Physical Exam: Vitals:   01/01/20 1538  BP: 100/70  Pulse: (!) 132  Weight: 83.5 kg  Height: 5' 11"  (1.803 m)   Wt Readings from Last 3 Encounters:  01/01/20 83.5 kg  12/24/19 82.7 kg  11/25/19 82.4 kg    Labs: Lab Results  Component Value Date   NA 137 12/24/2019   K 4.4 12/24/2019   CL 102 12/24/2019   CO2 29 12/24/2019   GLUCOSE 85 12/24/2019   BUN 15 12/24/2019   CREATININE 1.05 12/24/2019   CALCIUM 9.0 12/24/2019   Lab Results  Component Value Date   INR 1.0 09/11/2019   Lab Results  Component Value Date   CHOL 95 12/24/2019   HDL 29.70 (L) 12/24/2019   LDLCALC 34 12/24/2019   TRIG 158.0 (H)  12/24/2019     GEN- The patient is well appearing, alert and oriented x 3 today.   Head- normocephalic, atraumatic Eyes-  Sclera clear, conjunctiva pink Ears- hearing intact Oropharynx- clear Neck- supple, no JVP Lymph- no cervical lymphadenopathy Lungs- Clear to ausculation bilaterally, normal work of breathing Heart- irregular rate and rhythm, no murmurs, rubs or gallops, PMI not laterally displaced GI- soft, NT, ND, + BS Extremities- no clubbing, cyanosis, or edema MS- no significant deformity or atrophy Skin- no rash or lesion Psych- euthymic mood, full affect Neuro- strength and sensation are intact  EKG-afib with rvr at 132 bpm, qrs int 88 ms, qtc 438 ms  Echo-1. Left ventricular ejection fraction, by estimation, is 55 to 60%. The left ventricle has normal function. The left ventricle has no regional wall motion abnormalities. Left ventricular diastolic parameters are consistent with Grade I diastolic dysfunction (impaired relaxation). 2. Right ventricular systolic function is normal. The right ventricular size is normal. There is normal pulmonary artery systolic pressure. 3. Left atrial size was mildly dilated. 4. The mitral valve is normal in structure. No evidence of mitral valve regurgitation. No evidence of mitral stenosis. 5. The aortic valve is normal in structure. Aortic valve regurgitation is not visualized. No aortic stenosis is present. 6. The inferior vena cava is normal in size with greater than 50% respiratory variability, suggesting right atrial pressure of 3 mmHg. 7. Agitated saline contrast bubble study was negative, with no evidence of any interatrial shunt.    Assessment and Plan: 1. New onset  afib Asymptomatic Picked up on event monitor  Will not start rate control as BP is soft and he is not on any agent I can stop to allow for more BP  At rest, HR is controlled  He will reduce caffeine  Continue wearing of event monitor   2. CHA2DS2VASc score  of 4  Start eliquis 5 mg bid  No bleeding hisotry Bleeding precautions discussed   I will  see back in one week   Butch Penny C. Shaterica Mcclatchy, [facility] [address] [place], [place] [address] [phone]

## 2020-01-06 ENCOUNTER — Telehealth: Payer: Self-pay | Admitting: Internal Medicine

## 2020-01-06 NOTE — Progress Notes (Signed)
°  Chronic Care Management   Outreach Note  01/06/2020 Name: Michael [name] MRN: [id number] DOB: 09/14/1932  Referred by: Binnie Rail, MD Reason for referral : No chief complaint on file.   A second unsuccessful telephone outreach was attempted today. The patient was referred to pharmacist for assistance with care management and care coordination.  Follow Up Plan:   Carley Perdue [employer] Scheduler

## 2020-01-09 ENCOUNTER — Ambulatory Visit (HOSPITAL_COMMUNITY)
Admission: RE | Admit: 2020-01-09 | Discharge: 2020-01-09 | Disposition: A | Discharge status: Home / Self Care | Payer: Medicare Other | Source: Ambulatory Visit | Attending: Nurse Practitioner | Admitting: Nurse Practitioner

## 2020-01-09 ENCOUNTER — Encounter (HOSPITAL_COMMUNITY): Payer: Self-pay | Admitting: Nurse Practitioner

## 2020-01-09 ENCOUNTER — Other Ambulatory Visit: Payer: Self-pay

## 2020-01-09 ENCOUNTER — Telehealth: Payer: Self-pay

## 2020-01-09 VITALS — BP 160/84 | HR 65 | Ht 71.0 in | Wt 182.4 lb

## 2020-01-09 DIAGNOSIS — K5090 Crohn's disease, unspecified, without complications: Secondary | ICD-10-CM | POA: Diagnosis not present

## 2020-01-09 DIAGNOSIS — E2740 Unspecified adrenocortical insufficiency: Secondary | ICD-10-CM | POA: Insufficient documentation

## 2020-01-09 DIAGNOSIS — Z86718 Personal history of other venous thrombosis and embolism: Secondary | ICD-10-CM | POA: Diagnosis not present

## 2020-01-09 DIAGNOSIS — Z8673 Personal history of transient ischemic attack (TIA), and cerebral infarction without residual deficits: Secondary | ICD-10-CM | POA: Diagnosis not present

## 2020-01-09 DIAGNOSIS — I4891 Unspecified atrial fibrillation: Secondary | ICD-10-CM | POA: Diagnosis not present

## 2020-01-09 DIAGNOSIS — Z79899 Other long term (current) drug therapy: Secondary | ICD-10-CM | POA: Diagnosis not present

## 2020-01-09 DIAGNOSIS — D6869 Other thrombophilia: Secondary | ICD-10-CM | POA: Diagnosis not present

## 2020-01-09 DIAGNOSIS — Z7901 Long term (current) use of anticoagulants: Secondary | ICD-10-CM | POA: Insufficient documentation

## 2020-01-09 MED ORDER — APIXABAN 5 MG PO TABS: 5.0000 mg | ORAL_TABLET | Freq: Two times a day (BID) | ORAL | 3 refills | Status: DC

## 2020-01-09 NOTE — Telephone Encounter (Signed)
Monitor report received showed pt had Afib RVR with PVC HR 160-180.  Event occurred at 3:47PM  CT(4:47 PM [place] time).  Patient called asked if he remembers feeling a difference in his heart rate/ rhythm around this time.  Pt denies feeling any discomfort, expressed that he feels the same.  RN informed patient to call office if he has any concerns and keep scheduled appointments.

## 2020-01-09 NOTE — Progress Notes (Signed)
Primary Care Physician: Michael Rail, MD Referring Physician:Dr.    [name] is a 86 y.o. male with a h/o  TIA, memory loss, DVT, adrenal insufficiency, Crohn's disease here today as a new consult, referred by Michael [name], for the evaluation of possible arrhythmia.Marland Kitchen He was admitted to [facility] 09/11/19 with a TIA. Carotid artery dopplers 09/11/19 with mild bilateral carotid artery disease. Echo September 2021 with LVEF=55-60%, no valve disease. CT showed probably remote L thalamic lacunar infarctwhich was confirmed on MRI brain. She  was seen by Michael [name] 11/25/19, referred by Michael [name] for consideration of arrhythmia.  There was some delay on getting the monitor but now has been wearing for a few weeks and new onset  afib was noted yesterday  and he was referred to the afib clinic.   EKG shows afib at 132 bpm on presentation, at rest with PO, he is at 70-80 bpm. He is asymptomatic. He currently not on blood thinner but took  eliquis for around 6 months several years ago and did well. He denies a bleeding tendency.CHA2DS2VASc score is 5.  Rate control today is limited for for a systolic BP of 203.   Limited alcohol, no tobacco, snores but wife does not thinks he has apnea. Moderate caffeine. Wife  states he is high strung.   F/u in afib clinic, 01/09/20. He was notified earlier today that he had a short run of afib noted on the device. He was asymptomatic. He is on eliquis 5 mg bid and no issues with anticoagulation. He is in SR today.   Today, he denies symptoms of palpitations, chest pain, shortness of breath, orthopnea, PND, lower extremity edema, dizziness, presyncope, syncope, or neurologic sequela. The patient is tolerating medications without difficulties and is otherwise without complaint today.   Past Medical History:  Diagnosis Date  . Adrenal insufficiency ([place])   . Anemia   . Avascular necrosis ([place])   . Clavicle fracture 11/02/2017  . Colon polyp   . Crohn's disease ([place])    . Glaucoma   . Inguinal hernia recurrent bilateral   . Low testosterone   . Osteopenia   . Retinal ischemia    Past Surgical History:  Procedure Laterality Date  . ABDOMINAL SURGERY    . COLONOSCOPY N/A 12/13/2015   Procedure: COLONOSCOPY;  Surgeon: Laurence Spates, MD;  [facility];  Service: Endoscopy;  Laterality: N/A;  . COLONOSCOPY WITH PROPOFOL N/A 03/25/2019   Procedure: COLONOSCOPY WITH PROPOFOL;  Surgeon: Yetta Flock, MD;  [facility];  Service: Gastroenterology;  Laterality: N/A;  . COLONOSCOPY WITH PROPOFOL N/A 08/26/2019   Procedure: COLONOSCOPY WITH PROPOFOL;  Surgeon: Yetta Flock, MD;  [facility];  Service: Gastroenterology;  Laterality: N/A;  . ENDOSCOPIC MUCOSAL RESECTION N/A 08/26/2019   Procedure: ENDOSCOPIC MUCOSAL RESECTION;  Surgeon: Yetta Flock, MD;  [facility];  Service: Gastroenterology;  Laterality: N/A;  . INGUINAL HERNIA REPAIR Bilateral 05/21/2014   Procedure: OPEN BILATERAL INGUINAL HERNIA REPAIRS WITH MESH;  Surgeon: Donnie Mesa, MD;  Location: [place];  Service: General;  Laterality: Bilateral;  . POLYPECTOMY  03/25/2019   Procedure: POLYPECTOMY;  Surgeon: Yetta Flock, MD;  [facility];  Service: Gastroenterology;;  . POLYPECTOMY  08/26/2019   Procedure: POLYPECTOMY;  Surgeon: Yetta Flock, MD;  [facility];  Service: Gastroenterology;;  . Woodward Ku  08/26/2019   Procedure: Sprayed methylene blue;  Surgeon: Yetta Flock, MD;  [facility];  Service: Gastroenterology;;  . SMALL INTESTINE SURGERY  1968, 2001    related to Chrohn's disease  . TONSILLECTOMY      Current Outpatient Medications  Medication Sig Dispense Refill  . acetaminophen (TYLENOL) 500 MG tablet Take 500-1,000 mg by mouth every 8 (eight) hours as needed for mild pain or headache.     . Ascorbic Acid (VITAMIN C) 1000 MG tablet Take 1,000 mg by  mouth daily with breakfast.     . 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     . brimonidine (ALPHAGAN) 0.2 % ophthalmic solution Place 1 drop into the left eye See admin instructions. Instill 1 drop into the left eye twice a day- 6:45 AM and 5:50 PM    . budesonide (ENTOCORT EC) 3 MG 24 hr capsule Take by mouth as needed.    . Calcium Carbonate-Vit D-Min (CALCIUM 1200 PO) Take 1,200 mg by mouth daily with breakfast.    . Cholecalciferol (VITAMIN D3) 50 MCG (2000 UT) TABS Take 2,000 Units by mouth daily with breakfast.    . cyanocobalamin (,VITAMIN B-12,) 1000 MCG/ML injection INJECT 1 ML INTO THE MUSCLE EVERY 30 DAYS 10 mL 1  . dorzolamide-timolol (COSOPT) 22.3-6.8 MG/ML ophthalmic solution Place 1 drop into the left eye See admin instructions. Instill 1 drop into the left eye twice a day- 6:35 AM and 5:30 PM    . ferrous sulfate 325 (65 FE) MG tablet Take 325 mg by mouth every Monday, Wednesday, and Friday.     . latanoprost (XALATAN) 0.005 % ophthalmic solution Place 1 drop into the left eye See admin instructions. Instill 1 drop into the left eye at 6:10 PM daily    . loperamide (IMODIUM A-D) 2 MG tablet Take 2 mg by mouth daily.    . Netarsudil Dimesylate 0.02 % SOLN Place 1 drop into the left eye See admin instructions. Instill 1 drop into the left eye at 6:15 PM daily    . omeprazole (PRILOSEC OTC) 20 MG tablet Take 20 mg by mouth daily as needed (for reflux symptoms).    . prednisoLONE acetate (PRED FORTE) 1 % ophthalmic suspension Place 1 drop into the right eye See admin instructions. Instill 1 drop into the right eye four times a day- 7:00 AM, 12:00 noon, 4:00 PM, and 6:00 PM    . Syringe/Needle, Disp, (SYRINGE 3CC/25GX1") 25G X 1" 3 ML MISC Use for monthly B12 injections 12 each 0  . tamsulosin (FLOMAX) 0.4 MG CAPS capsule Take 0.4 mg by mouth daily.    Marland Kitchen apixaban (ELIQUIS) 5 MG TABS  tablet Take 1 tablet (5 mg total) by mouth 2 (two) times daily. 180 tablet 3   No current facility-administered medications for this encounter.    No Known Allergies  Social History   Socioeconomic History  . Marital status: Married    Spouse name: Not on file  . Number of children: 3  . Years of education: Not on file  . Highest education level: Not on file  Occupational History  . Occupation: Retired  Tobacco Use  . Smoking status: Former Smoker    Quit date: 04/01/1976    Years since quitting: 43.8  . Smokeless tobacco: Never Used  Vaping Use  . Vaping Use: Never used  Substance and Sexual Activity  . Alcohol use: Yes    Alcohol/week: 2.0 standard drinks    Types: 2 Glasses of wine per week  Comment: 3-4  glass wine  weekly  . Drug use: No  . Sexual activity: Never  Other Topics Concern  . Not on file  Social History Narrative  . Not on file   Social Determinants of Health   Financial Resource Strain: Not on file  Food Insecurity: Not on file  Transportation Needs: Not on file  Physical Activity: Not on file  Stress: Not on file  Social Connections: Not on file  Intimate Partner Violence: Not on file    Family History  Problem Relation Age of Onset  . Deep vein thrombosis Mother   . Heart disease Father   . Stomach cancer Neg Hx   . Colon cancer Neg Hx   . Esophageal cancer Neg Hx   . Pancreatic cancer Neg Hx   . Rectal cancer Neg Hx   . Colon polyps Neg Hx     ROS- All systems are reviewed and negative except as per the HPI above  Physical Exam: Vitals:   01/09/20 1500  BP: (!) 160/84  Pulse: 65  Weight: 82.7 kg  Height: 5' 11"  (1.803 m)   Wt Readings from Last 3 Encounters:  01/09/20 82.7 kg  01/01/20 83.5 kg  12/24/19 82.7 kg    Labs: Lab Results  Component Value Date   NA 137 12/24/2019   K 4.4 12/24/2019   CL 102 12/24/2019   CO2 29 12/24/2019   GLUCOSE 85 12/24/2019   BUN 15 12/24/2019   CREATININE 1.05 12/24/2019    CALCIUM 9.0 12/24/2019   Lab Results  Component Value Date   INR 1.0 09/11/2019   Lab Results  Component Value Date   CHOL 95 12/24/2019   HDL 29.70 (L) 12/24/2019   LDLCALC 34 12/24/2019   TRIG 158.0 (H) 12/24/2019     GEN- The patient is well appearing, alert and oriented x 3 today.   Head- normocephalic, atraumatic Eyes-  Sclera clear, conjunctiva pink Ears- hearing intact Oropharynx- clear Neck- supple, no JVP Lymph- no cervical lymphadenopathy Lungs- Clear to ausculation bilaterally, normal work of breathing Heart- irregular rate and rhythm, no murmurs, rubs or gallops, PMI not laterally displaced GI- soft, NT, ND, + BS Extremities- no clubbing, cyanosis, or edema MS- no significant deformity or atrophy Skin- no rash or lesion Psych- euthymic mood, full affect Neuro- strength and sensation are intact  EKG-afib with rvr at 132 bpm, qrs int 88 ms, qtc 438 ms  Echo-1. Left ventricular ejection fraction, by estimation, is 55 to 60%. The left ventricle has normal function. The left ventricle has no regional wall motion abnormalities. Left ventricular diastolic parameters are consistent with Grade I diastolic dysfunction (impaired relaxation). 2. Right ventricular systolic function is normal. The right ventricular size is normal. There is normal pulmonary artery systolic pressure. 3. Left atrial size was mildly dilated. 4. The mitral valve is normal in structure. No evidence of mitral valve regurgitation. No evidence of mitral stenosis. 5. The aortic valve is normal in structure. Aortic valve regurgitation is not visualized. No aortic stenosis is present. 6. The inferior vena cava is normal in size with greater than 50% respiratory variability, suggesting right atrial pressure of 3 mmHg. 7. Agitated saline contrast bubble study was negative, with no evidence of any interatrial shunt.    Assessment and Plan: 1. New onset  afib Asymptomatic In SR  2 events Picked up  on event monitor, pt was asymptoamtic Will hold off on daily rate control until full monitor can be reviewed for afib  burden. He will reduce caffeine  Continue wearing of event monitor   2. CHA2DS2VASc score of 4  On  eliquis 5 mg bid  Have sent rx to [facility] so he can get at a mor reasonable price     I will see back in 2 weeks  F/u with Dr. Angelena Form 2/14 Geroge Baseman. Keynan Heffern, [facility] [address] [place], [place] [address] [phone]

## 2020-01-10 ENCOUNTER — Ambulatory Visit (INDEPENDENT_AMBULATORY_CARE_PROVIDER_SITE_OTHER): Payer: Medicare Other

## 2020-01-10 DIAGNOSIS — I4891 Unspecified atrial fibrillation: Secondary | ICD-10-CM

## 2020-01-10 DIAGNOSIS — G459 Transient cerebral ischemic attack, unspecified: Secondary | ICD-10-CM | POA: Diagnosis not present

## 2020-01-21 DIAGNOSIS — R3915 Urgency of urination: Secondary | ICD-10-CM | POA: Diagnosis not present

## 2020-01-23 ENCOUNTER — Other Ambulatory Visit: Payer: Self-pay | Admitting: Cardiovascular Disease

## 2020-01-23 ENCOUNTER — Telehealth: Payer: Self-pay | Admitting: Cardiovascular Disease

## 2020-01-23 DIAGNOSIS — G459 Transient cerebral ischemic attack, unspecified: Secondary | ICD-10-CM

## 2020-01-23 DIAGNOSIS — I4891 Unspecified atrial fibrillation: Secondary | ICD-10-CM

## 2020-01-23 NOTE — Telephone Encounter (Signed)
New Message:     Pt says she have returned the Monitor. She would like to talk to Dr Camillia Herter nurse about what steps he will use next in regards to the Monitors.

## 2020-01-23 NOTE — Telephone Encounter (Signed)
Spoke with patient's wife.  She asks should pt be doing other monitoring of HR/rhythm since returning monitor.  Adv no other monitoring is needed for now.  If he is started on a new med, he may monitor BP and HR at home but since we've already established he has afib, and he is taking Eliquis there is nothing more to do at this point until his appointment in afib clinic next week.    She is in agreement and states he gets anxious easily and thinks self monitoring rhythm would cause more anxiety.

## 2020-01-29 ENCOUNTER — Other Ambulatory Visit: Payer: Self-pay

## 2020-01-29 ENCOUNTER — Ambulatory Visit (HOSPITAL_COMMUNITY)
Admission: RE | Admit: 2020-01-29 | Discharge: 2020-01-29 | Disposition: A | Discharge status: Home / Self Care | Payer: Medicare Other | Source: Ambulatory Visit | Attending: Nurse Practitioner | Admitting: Nurse Practitioner

## 2020-01-29 ENCOUNTER — Encounter (HOSPITAL_COMMUNITY): Payer: Self-pay | Admitting: Nurse Practitioner

## 2020-01-29 VITALS — BP 130/84 | HR 69 | Wt 180.4 lb

## 2020-01-29 DIAGNOSIS — I4891 Unspecified atrial fibrillation: Secondary | ICD-10-CM | POA: Diagnosis not present

## 2020-01-29 DIAGNOSIS — Z87891 Personal history of nicotine dependence: Secondary | ICD-10-CM | POA: Diagnosis not present

## 2020-01-29 DIAGNOSIS — Z86718 Personal history of other venous thrombosis and embolism: Secondary | ICD-10-CM | POA: Insufficient documentation

## 2020-01-29 DIAGNOSIS — D6869 Other thrombophilia: Secondary | ICD-10-CM | POA: Diagnosis not present

## 2020-01-29 DIAGNOSIS — Z8249 Family history of ischemic heart disease and other diseases of the circulatory system: Secondary | ICD-10-CM | POA: Insufficient documentation

## 2020-01-29 DIAGNOSIS — Z7901 Long term (current) use of anticoagulants: Secondary | ICD-10-CM | POA: Insufficient documentation

## 2020-01-29 DIAGNOSIS — Z79899 Other long term (current) drug therapy: Secondary | ICD-10-CM | POA: Insufficient documentation

## 2020-01-29 LAB — CBC
HCT: 35.6 % — ABNORMAL LOW (ref 39.0–52.0)
Hemoglobin: 12.3 g/dL — ABNORMAL LOW (ref 13.0–17.0)
MCH: 32 pg (ref 26.0–34.0)
MCHC: 34.6 g/dL (ref 30.0–36.0)
MCV: 92.7 fL (ref 80.0–100.0)
Platelets: 206 10*3/uL (ref 150–400)
RBC: 3.84 MIL/uL — ABNORMAL LOW (ref 4.22–5.81)
RDW: 13.1 % (ref 11.5–15.5)
WBC: 7.2 10*3/uL (ref 4.0–10.5)
nRBC: 0 % (ref 0.0–0.2)

## 2020-01-29 LAB — BASIC METABOLIC PANEL
Anion gap: 8 (ref 5–15)
BUN: 14 mg/dL (ref 8–23)
CO2: 26 mmol/L (ref 22–32)
Calcium: 9 mg/dL (ref 8.9–10.3)
Chloride: 104 mmol/L (ref 98–111)
Creatinine, Ser: 1.16 mg/dL (ref 0.61–1.24)
GFR, Estimated: >60 mL/min (ref >60)
Glucose, Bld: 98 mg/dL (ref 70–99)
Potassium: 4.2 mmol/L (ref 3.5–5.1)
Sodium: 138 mmol/L (ref 135–145)

## 2020-01-29 MED ORDER — METOPROLOL SUCCINATE ER 25 MG PO TB24: 12.5000 mg | ORAL_TABLET | Freq: Every day | ORAL | 3 refills | Status: DC

## 2020-01-29 NOTE — Patient Instructions (Signed)
Start metoprolol 1/2 tablet once a day at bedtime

## 2020-01-29 NOTE — Progress Notes (Signed)
Primary Care Physician: Binnie Rail, MD Referring Physician:Dr.    AMAREON PHUNG is a 87 y.o. male with a h/o  TIA, memory loss, DVT, adrenal insufficiency, Crohn's disease here today as a new consult, referred by Dr. Quay Burow, for the evaluation of possible arrhythmia.Marland Kitchen He was admitted to [facility] 09/11/19 with a TIA. Carotid artery dopplers 09/11/19 with mild bilateral carotid artery disease. Echo September 2021 with LVEF=55-60%, no valve disease. CT showed probably remote L thalamic lacunar infarctwhich was confirmed on MRI brain. She  was seen by Dr. Julianne Handler 11/25/19, referred by Dr. Quay Burow for consideration of arrhythmia.  There was some delay on getting the monitor but now has been wearing for a few weeks and new onset  afib was noted yesterday  and he was referred to the afib clinic.   EKG shows afib at 132 bpm on presentation, at rest with PO, he is at 70-80 bpm. He is asymptomatic. He currently not on blood thinner but took  eliquis for around 6 months several years ago and did well. He denies a bleeding tendency.CHA2DS2VASc score is 5.  Rate control today is limited for for a systolic BP of 222.   Limited alcohol, no tobacco, snores but wife does not thinks he has apnea. Moderate caffeine. Wife  states he is high strung.   F/u in afib clinic, 01/09/20. He was notified earlier today that he had a short run of afib noted on the device. He was asymptomatic. He is on eliquis 5 mg bid and no issues with anticoagulation. He is in SR today.   F/u in afib clinic, 01/29/19. Reviewed heart monitor with pt. Afib 3% of the time but with HR's up to 170 bpm. Pt was asymptomatic. I think he will benefit from addition of  low dose BB to  blunt some of the fast HR's. He is not that thrilled to take anticoagulation or BB but he will try drug. No bleeding issues with start of eliquis. Wife tells me that he had 30 polyps cut out last year. He is due to see GI MD soon. CBC and bmet today with start of  anticoagulation one month ago. He was on eliquis for 6 months in the remote past and did not have any issues with bleeding. He is chronically anemic    Today, he denies symptoms of palpitations, chest pain, shortness of breath, orthopnea, PND, lower extremity edema, dizziness, presyncope, syncope, or neurologic sequela. The patient is tolerating medications without difficulties and is otherwise without complaint today.   Past Medical History:  Diagnosis Date  . Adrenal insufficiency ([place])   . Anemia   . Avascular necrosis ([place])   . Clavicle fracture 11/02/2017  . Colon polyp   . Crohn's disease ([place])   . Glaucoma   . Inguinal hernia recurrent bilateral   . Low testosterone   . Osteopenia   . Retinal ischemia    Past Surgical History:  Procedure Laterality Date  . ABDOMINAL SURGERY    . COLONOSCOPY N/A 12/13/2015   Procedure: COLONOSCOPY;  Surgeon: Laurence Spates, MD;  Location: [facility];  Service: Endoscopy;  Laterality: N/A;  . COLONOSCOPY WITH PROPOFOL N/A 03/25/2019   Procedure: COLONOSCOPY WITH PROPOFOL;  Surgeon: Yetta Flock, MD;  Location: [facility];  Service: Gastroenterology;  Laterality: N/A;  . COLONOSCOPY WITH PROPOFOL N/A 08/26/2019   Procedure: COLONOSCOPY WITH PROPOFOL;  Surgeon: Yetta Flock, MD;  Location: [facility];  Service: Gastroenterology;  Laterality: N/A;  . ENDOSCOPIC MUCOSAL RESECTION  N/A 08/26/2019   Procedure: ENDOSCOPIC MUCOSAL RESECTION;  Surgeon: Yetta Flock, MD;  Location: [facility];  Service: Gastroenterology;  Laterality: N/A;  . INGUINAL HERNIA REPAIR Bilateral 05/21/2014   Procedure: OPEN BILATERAL INGUINAL HERNIA REPAIRS WITH MESH;  Surgeon: Donnie Mesa, MD;  Location: [facility];  Service: General;  Laterality: Bilateral;  . POLYPECTOMY  03/25/2019   Procedure: POLYPECTOMY;  Surgeon: Yetta Flock, MD;  Location: [facility];  Service: Gastroenterology;;  . POLYPECTOMY  08/26/2019    Procedure: POLYPECTOMY;  Surgeon: Yetta Flock, MD;  Location: [facility];  Service: Gastroenterology;;  . Woodward Ku  08/26/2019   Procedure: Sprayed methylene blue;  Surgeon: Yetta Flock, MD;  Location: [facility];  Service: Gastroenterology;;  . SMALL INTESTINE SURGERY  1968, 2001    related to Chrohn's disease  . TONSILLECTOMY      Current Outpatient Medications  Medication Sig Dispense Refill  . acetaminophen (TYLENOL) 500 MG tablet Take 500-1,000 mg by mouth every 8 (eight) hours as needed for mild pain or headache.     Marland Kitchen apixaban (ELIQUIS) 5 MG TABS tablet Take 1 tablet (5 mg total) by mouth 2 (two) times daily. 180 tablet 3  . Ascorbic Acid (VITAMIN C) 1000 MG tablet Take 1,000 mg by mouth daily with breakfast.     . 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     . brimonidine (ALPHAGAN) 0.2 % ophthalmic solution Place 1 drop into the left eye See admin instructions. Instill 1 drop into the left eye twice a day- 6:45 AM and 5:50 PM    . budesonide (ENTOCORT EC) 3 MG 24 hr capsule Take by mouth as needed.    . Calcium Carbonate-Vit D-Min (CALCIUM 1200 PO) Take 1,200 mg by mouth daily with breakfast.    . Cholecalciferol (VITAMIN D3) 50 MCG (2000 UT) TABS Take 2,000 Units by mouth daily with breakfast.    . cyanocobalamin (,VITAMIN B-12,) 1000 MCG/ML injection INJECT 1 ML INTO THE MUSCLE EVERY 30 DAYS 10 mL 1  . dorzolamide-timolol (COSOPT) 22.3-6.8 MG/ML ophthalmic solution Place 1 drop into the left eye See admin instructions. Instill 1 drop into the left eye twice a day- 6:35 AM and 5:30 PM    . ferrous sulfate 325 (65 FE) MG tablet Take 325 mg by mouth every Monday, Wednesday, and Friday.     . latanoprost (XALATAN) 0.005 % ophthalmic solution Place 1 drop into the left eye See admin instructions. Instill 1 drop into the left eye at 6:10 PM daily    .  loperamide (IMODIUM A-D) 2 MG tablet Take 2 mg by mouth daily.    . Netarsudil Dimesylate 0.02 % SOLN Place 1 drop into the left eye See admin instructions. Instill 1 drop into the left eye at 6:15 PM daily    . omeprazole (PRILOSEC OTC) 20 MG tablet Take 20 mg by mouth daily as needed (for reflux symptoms).    . prednisoLONE acetate (PRED FORTE) 1 % ophthalmic suspension Place 1 drop into the right eye See admin instructions. Instill 1 drop into the right eye four times a day- 7:00 AM, 12:00 noon, 4:00 PM, and 6:00 PM    . Syringe/Needle, Disp, (SYRINGE 3CC/25GX1") 25G X 1" 3 ML MISC Use for monthly B12 injections 12 each 0  . tamsulosin (FLOMAX) 0.4 MG CAPS capsule Take 0.4 mg by mouth daily.     No current facility-administered  medications for this encounter.    No Known Allergies  Social History   Socioeconomic History  . Marital status: Married    Spouse name: Not on file  . Number of children: 3  . Years of education: Not on file  . Highest education level: Not on file  Occupational History  . Occupation: Retired  Tobacco Use  . Smoking status: Former Smoker    Quit date: 04/01/1976    Years since quitting: 43.8  . Smokeless tobacco: Never Used  Vaping Use  . Vaping Use: Never used  Substance and Sexual Activity  . Alcohol use: Yes    Alcohol/week: 2.0 standard drinks    Types: 2 Glasses of wine per week    Comment: 3-4  glass wine  weekly  . Drug use: No  . Sexual activity: Never  Other Topics Concern  . Not on file  Social History Narrative  . Not on file   Social Determinants of Health   Financial Resource Strain: Not on file  Food Insecurity: Not on file  Transportation Needs: Not on file  Physical Activity: Not on file  Stress: Not on file  Social Connections: Not on file  Intimate Partner Violence: Not on file    Family History  Problem Relation Age of Onset  . Deep vein thrombosis Mother   . Heart disease Father   . Stomach cancer Neg Hx   . Colon  cancer Neg Hx   . Esophageal cancer Neg Hx   . Pancreatic cancer Neg Hx   . Rectal cancer Neg Hx   . Colon polyps Neg Hx     ROS- All systems are reviewed and negative except as per the HPI above  Physical Exam: There were no vitals filed for this visit. Wt Readings from Last 3 Encounters:  01/09/20 82.7 kg  01/01/20 83.5 kg  12/24/19 82.7 kg    Labs: Lab Results  Component Value Date   NA 137 12/24/2019   K 4.4 12/24/2019   CL 102 12/24/2019   CO2 29 12/24/2019   GLUCOSE 85 12/24/2019   BUN 15 12/24/2019   CREATININE 1.05 12/24/2019   CALCIUM 9.0 12/24/2019   Lab Results  Component Value Date   INR 1.0 09/11/2019   Lab Results  Component Value Date   CHOL 95 12/24/2019   HDL 29.70 (L) 12/24/2019   LDLCALC 34 12/24/2019   TRIG 158.0 (H) 12/24/2019     GEN- The patient is well appearing, alert and oriented x 3 today.   Head- normocephalic, atraumatic Eyes-  Sclera clear, conjunctiva pink Ears- hearing intact Oropharynx- clear Neck- supple, no JVP Lymph- no cervical lymphadenopathy Lungs- Clear to ausculation bilaterally, normal work of breathing Heart- regular rate and rhythm, no murmurs, rubs or gallops, PMI not laterally displaced GI- soft, NT, ND, + BS Extremities- no clubbing, cyanosis, or edema MS- no significant deformity or atrophy Skin- no rash or lesion Psych- euthymic mood, full affect Neuro- strength and sensation are intact  EKG- NSR at at 69 bpm, pr int 168 ms, qrs int 102 ms, qtc 422 ms   Zio  patch- Sinus rhythm Atrial fibrillation with rapid ventricular response, 3% burden Rare premature atrial contractions (less than 1%) Rare premature ventricular contractions (less than !%) Several short runs of non-sustained ventricular tachycardia, longest 5 beats  Echo-1. Left ventricular ejection fraction, by estimation, is 55 to 60%. The left ventricle has normal function. The left ventricle has no regional wall motion abnormalities. Left  ventricular  diastolic parameters are consistent with Grade I diastolic dysfunction (impaired relaxation). 2. Right ventricular systolic function is normal. The right ventricular size is normal. There is normal pulmonary artery systolic pressure. 3. Left atrial size was mildly dilated. 4. The mitral valve is normal in structure. No evidence of mitral valve regurgitation. No evidence of mitral stenosis. 5. The aortic valve is normal in structure. Aortic valve regurgitation is not visualized. No aortic stenosis is present. 6. The inferior vena cava is normal in size with greater than 50% respiratory variability, suggesting right atrial pressure of 3 mmHg. 7. Agitated saline contrast bubble study was negative, with no evidence of any interatrial shunt.    Assessment and Plan: 1. New onset  afib Asymptomatic In SR today  Several events seen on event monitor, pt was asymptoamtic Discussed benefit of starting daily BB, to minimize events of afib or blunt v response,  will try low dose metoprolol er 25 mg 1/2 tab at hs   2. CHA2DS2VASc score of 4  On  eliquis 5 mg bid  Bleeding precautions reviewed  Getting thru [facility] for reasonable price  Cbc/bmet today    F/u with Dr. Angelena Form 2/14 afib clinic as needed    Butch Penny C. Kaiyana Bedore, [facility] [address] [place], [place] [address] [phone]

## 2020-01-30 ENCOUNTER — Ambulatory Visit: Payer: Medicare Other | Admitting: Podiatry

## 2020-01-30 ENCOUNTER — Encounter: Payer: Self-pay | Admitting: Podiatry

## 2020-01-30 DIAGNOSIS — D2371 Other benign neoplasm of skin of right lower limb, including hip: Secondary | ICD-10-CM | POA: Diagnosis not present

## 2020-01-30 DIAGNOSIS — M7751 Other enthesopathy of right foot: Secondary | ICD-10-CM | POA: Diagnosis not present

## 2020-01-30 MED ORDER — DEXAMETHASONE SODIUM PHOSPHATE 120 MG/30ML IJ SOLN
2.0000 mg | Freq: Once | INTRAMUSCULAR | Status: AC
Start: 2020-01-30 — End: 2020-01-30
  Administered 2020-01-30: 2 mg via INTRA_ARTICULAR

## 2020-01-30 NOTE — Progress Notes (Signed)
He presents today chief complaint of a painful area subfifth metatarsal on the right foot.  Objective: Pulses remain palpable. He has palpable bursa beneath the fifth metatarsal of the right foot with fluctuance. He also has an overlying reactive hyperkeratotic lesion.  Assessment: Fifth metatarsal bursitis plantarly with reactive hyper keratoma benign skin lesion.  Plan: Debridement of benign skin lesion today salicylic acid under occlusion to be left on for 3 days and then washed off thoroughly. Also injected the bursa today with 2 mg of dexamethasone local anesthetic follow-up with him as needed. We did discuss possible need for orthotics he understands that.

## 2020-01-31 ENCOUNTER — Telehealth: Payer: Self-pay | Admitting: Internal Medicine

## 2020-01-31 NOTE — Progress Notes (Signed)
  Chronic Care Management   Note  01/31/2020 Name: DELVONTE BERENSON MRN: [id number] DOB: 06-Jun-1932  KHING BELCHER is a 87 y.o. year old male who is a primary care patient of Burns, Claudina Lick, MD. I reached out to Lizabeth Leyden by phone today in response to a referral sent by Mr. Juluis Fitzsimmons Vasseur's PCP, Binnie Rail, MD.   Mr. Shellhammer was given information about Chronic Care Management services today including:  1. CCM service includes personalized support from designated clinical staff supervised by his physician, including individualized plan of care and coordination with other care providers 2. 24/7 contact phone numbers for assistance for urgent and routine care needs. 3. Service will only be billed when office clinical staff spend 20 minutes or more in a month to coordinate care. 4. Only one practitioner may furnish and bill the service in a calendar month. 5. The patient may stop CCM services at any time (effective at the end of the month) by phone call to the office staff.   Patient wishes to consider information provided and/or speak with a member of the care team before deciding about enrollment in care management services.   Follow up plan:   Carley Perdue [employer] Scheduler

## 2020-02-03 ENCOUNTER — Telehealth: Payer: Self-pay | Admitting: Cardiovascular Disease

## 2020-02-03 NOTE — Telephone Encounter (Signed)
No other recs. Agree. Thanks

## 2020-02-03 NOTE — Telephone Encounter (Signed)
I spoke with the patient's wife. She wanted to get Dr. Camillia Herter input on starting low dose BB that was recommended at [place] [facility].  I adv this medicine will be helpful for helping keep his fast HRs down and should not have much effect on BP. - one of their concerns.  He may take it at night but gets up frequently and is worried about getting dizzy.  Reassured it is low dose and he should tolerate and to call if any low BPs or dizziness.  Also asks about aspirin 81 mg.  He has continued this and I adv to stop aspirin 81 mg since on Eliquis. (it was prescribed but has fallen off med list)  Aware that if there are other recommendations from Dr. Angelena Form I will call them back.  He is scheduled to see cardiology 02/24/20.

## 2020-02-03 NOTE — Telephone Encounter (Signed)
Pt c/o medication issue:  1. Name of Medication:  metoprolol succinate (TOPROL XL) 25 MG 24 hr tablet apixaban (ELIQUIS) 5 MG TABS tablet Baby Aspirin  2. How are you currently taking this medication (dosage and times per day)? Not currently taking metoprolol, currently taking Baby Aspirin and Eliquis as prescribed.   3. Are you having a reaction (difficulty breathing--STAT)? No   4. What is your medication issue? [name] is calling stating the Afib clinic is wanting to start [facility] on metoprolol. She is wanting to know if Dr. Angelena Form is in agreement with this before he begins taking it. She is also wants to know if Asaf can stop taking baby aspirin since he now takes Eliquis. Please advise.

## 2020-02-04 ENCOUNTER — Encounter: Payer: Self-pay | Admitting: Gastroenterology

## 2020-02-04 ENCOUNTER — Telehealth: Payer: Self-pay | Admitting: Gastroenterology

## 2020-02-04 ENCOUNTER — Ambulatory Visit: Payer: Medicare Other | Admitting: Gastroenterology

## 2020-02-04 ENCOUNTER — Telehealth: Payer: Self-pay

## 2020-02-04 ENCOUNTER — Other Ambulatory Visit: Payer: Self-pay

## 2020-02-04 VITALS — BP 126/64 | HR 72 | Ht 71.0 in | Wt 181.2 lb

## 2020-02-04 DIAGNOSIS — Z86010 Personal history of colonic polyps: Secondary | ICD-10-CM

## 2020-02-04 DIAGNOSIS — K50019 Crohn's disease of small intestine with unspecified complications: Secondary | ICD-10-CM

## 2020-02-04 DIAGNOSIS — Z7901 Long term (current) use of anticoagulants: Secondary | ICD-10-CM

## 2020-02-04 MED ORDER — SUTAB 1479-225-188 MG PO TABS: 1.0000 | ORAL_TABLET | Freq: Once | ORAL | 0 refills | Status: AC

## 2020-02-04 NOTE — Telephone Encounter (Signed)
[facility] Pre-operative Risk Assessment     Request for surgical clearance:     Endoscopy Procedure  What type of surgery is being performed?  COLONOSCOPY  When is this surgery scheduled?     03-19-2020  What type of clearance is required ?   Pharmacy  Are there any medications that need to be held prior to surgery and how long? ELIQUIS 2 DAYS  Practice name and name of physician performing surgery?   [facility]  What is your office phone and fax number?      Phone- [phone]  Fax- [phone] ATTN: Tia Alert, CMA  Anesthesia type (None, local, MAC, general) ?     MAC  Thank you

## 2020-02-04 NOTE — Telephone Encounter (Signed)
Will forward to Dr Angelena Form for final clearance recommendation in setting of high CV risk and higher bleed risk procedure.

## 2020-02-04 NOTE — Telephone Encounter (Signed)
OK to hold Eliquis for two days prior to the procedure. There is a risk of recurrent TIA but if the colonoscopy is felt to be necessary, then I owuld proceed. Gerald Stabs

## 2020-02-04 NOTE — Telephone Encounter (Signed)
Hello, This patient is undergoing colonoscopy for removal of polyps which have been large in the past and at risk for bleeding. Given his GFR, our ASGE guidelines recommend at least a 2 day hold to perform a colonoscopy when performing a polypectomy. Is this acceptable? If not, will make it difficult to proceed with colonoscopy. Thanks for clarification.

## 2020-02-04 NOTE — Telephone Encounter (Signed)
Called and spoke to patient. He understands to hold Eliquis on March 8th and 9th for procedure on 3-10.

## 2020-02-04 NOTE — Progress Notes (Signed)
HPI :  87 year old male here for follow-up for Crohn's disease.   Crohn's history: Previously followed by Dr. Earle Gell. 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 He has had 2 operations for his Crohns in 1968 and then again 2001 or so for small bowel resections. He remotely was treated with prednisone as needed in the past. He was never been on any other maintenance therapy for Crohn's disease other than prednisone and surgery. He has had hospitalizations in the past for bowel obstructions. Unfortunately he has suffered complications from chronic steroid use. He is currently being followed by Endocrinology foradrenal insufficiency. He has also been noted to have hepatic steatosis and avascular necrosis of the hips on CT scan. His course has also been complicated by history of diverticulosis related bleeding.Most recently has been using budesonide for flares as needed, and noted to have numerous colon polyps.  SINCE LAST VISIT:  87 year old male here for follow-up visit for Crohn's disease and history of colon polyps.  See prior notes for full days details of his case.  He had 3 colonoscopies with me last year for numerous large colon polyps in the setting of fair bowel preps, polyps near or at the surgical anastomosis and very challenging to remove.  I have presented his case at the IBD multidisciplinary conference with general surgery and my fellow GI colleagues.  Patient has declined surgery to date for this issue and he is opted for surveillance colonoscopy.  His last exam with me was in August 2021, he has had numerous polyps removed today, few more removed at the last exam including adenoma invading or close to his surgical anastomosis.  His course has included a TIA this past September which was ultimately thought to be due to underlying atrial fibrillation.  He does not feel atrial fibrillation but was noted on a Holter monitor.  He is now on Eliquis for this and had Toprol  recently added to his regimen to control sporadically elevated heart rates.  He generally has been tolerating this pretty well.  Ejection fraction looked okay last year.  He denies any cardiopulmonary symptoms.  He has not required any budesonide since of last seen him.  He is eating essentially whatever he wants and has no abdominal pain.  He has ongoing variable bowel movements but nothing out of the ordinary.  No blood in his stools.  He is generally feeling pretty well without any complaints today.  We discussed how aggressive he and his wife wanted to be moving forward in regards to his colon polyps and surveillance exams.  Wife accompanied him to the visit today.  Of note we have previously discussed biologic therapy for his Crohn's disease in the past which she has declined.  He considered Entyvio but it was over $2000 per infusion and he could not afford it.  Prior evaluation: Colonoscopy 12/2015 -[name] -normal colon, patent ileocolonic anastomosis, active Crohn's ileitis  Colonoscopy 01/24/2019 - Preparation of the colon was fair. - Crohn's disease with mild ileitis. - Patent end-to-side ileo-colonic anastomosis, characterized by mild stenosis. Biopsied. - Abnormal mucosa in the ascending colon. Biopsied. - Two 3 to 6 mm polyps in the ascending colon, removed with a cold snare. Resected and retrieved. - Eight 4 to 12 mm polyps in the transverse colon, removed with a cold snare. Resected and retrieved. - One large polyp in the transverse colon. Tattooed. Not removed as outlined above - One 10 mm polyp at the splenic flexure, removed with a cold snare.  Resected and retrieved. - One 10 mm polyp in the descending colon, removed with a cold snare. Resected and retrieved. - Five 5 to 10 mm polyps in the sigmoid colon, removed with a cold snare. Resected and retrieved. - Two 4 mm polypspolyps at the recto-sigmoid colon, removed with a cold snare. Resected and retrieved. -  Diverticulosis in the entire examined colon. - Stool in the entire examined colon. - Colonic spasm. - The examination was otherwise normal.  Most polyps adenomatous Biopsies of right sided abnormality adenomatous  Colonoscopy 03/25/19  -Preparation of the colon was fair with significant spasm which prolonged this exam - Active ileal Crohn's disease as described - [name] i2. - Patent end-to-side ileo-colonic anastomosis, characterized by inflammation. - Numerous flat polyps noted in the ascending colon and proximal transverse colon as noted. Several minutes spent lavaging the colon and methylene blue used to evaluate the right colon to better delineate some of these especially the largest lesion which may extend into the surgical anastomosis. Largest transverse polyp removed as outlined, with another large transverse polyp noted today which was not removed given duration of procedure and colonic spasm.  FINAL MICROSCOPIC DIAGNOSIS:   A. COLON, RIGHT, POLYPECTOMY:  - Tubular adenoma (x2 fragments).  - No high grade dysplasia or malignancy.   B. COLON, RIGHT NEAR ANASTAMOSIS, POLYPECTOMY:  - Tubular adenoma (multiple fragments).  - No high grade dysplasia or malignancy.   C. COLON, PROXIMAL TRANSVERSE, POLYPECTOMY:  - Tubular adenoma (multiple fragments).  - No high grade dysplasia or malignancy.   D. COLON, TRANSVERSE, LARGE, POLYPECTOMY:  - Tubular adenoma (multiple fragments).  - No high grade dysplasia or malignancy.    Colonoscopy 08/26/19 - Crohn's disease with mildly active ileitis. - Patent end-to-side ileo-colonic anastomosis. - Prior polypectomy site in the ascending colon, unclear if residual polyp vs. normal variant ileal tissue given location at the anatomosis. Area removed piecemeal using a cold snare. Resected and retrieved. - Chromoendoscopy applied to the proximal transverse and right colon. - Two 5 to 20 mm polyps in the transverse colon, removed  piecemeal using a cold snare. Resected and retrieved. - Diverticulosis in the transverse colon and in the left colon. - The examination was otherwise normal. - Spatic colon treated with glucagon. Overall, very challenging exam, time needed to clear the colon, visualization difficult given spasm. I think the colon at this point has been cleared of all high risk lesions however difficult to say if there is residual / recurrence at the polyp site near the anastomosis. Under normal circumstances surgery would be recommended to remove right colon and anastomosis however given patient's age he has elected for colonoscopy surveillance.   FINAL MICROSCOPIC DIAGNOSIS:   A. COLON, RIGHT, SNARE POLYPECTOMY:  - Tubular adenoma(s)  - Negative for high-grade dysplasia or malignancy   B. COLON, TRANSVERSE, SNARE POLYPECTOMY:  - Tubular adenoma(s)  - Negative for high-grade dysplasia or malignancy   C. COLON, TRANSVERSE, SNARE POLYPECTOMY:  - Tubular adenoma(s)  - Negative for high-grade dysplasia or malignancy    Past Medical History:  Diagnosis Date  . Adrenal insufficiency ([place])   . Anemia   . Atrial fibrillation ([place])   . Avascular necrosis ([place])   . Clavicle fracture 11/02/2017  . Colon polyp   . Colon polyps   . Crohn's disease ([place])   . Glaucoma   . Inguinal hernia recurrent bilateral   . Low testosterone   . Osteopenia   . Retinal ischemia      Past  Surgical History:  Procedure Laterality Date  . ABDOMINAL SURGERY    . COLONOSCOPY N/A 12/13/2015   Procedure: COLONOSCOPY;  Surgeon: Laurence Spates, MD;  Location: [facility];  Service: Endoscopy;  Laterality: N/A;  . COLONOSCOPY WITH PROPOFOL N/A 03/25/2019   Procedure: COLONOSCOPY WITH PROPOFOL;  Surgeon: Yetta Flock, MD;  Location: [facility];  Service: Gastroenterology;  Laterality: N/A;  . COLONOSCOPY WITH PROPOFOL N/A 08/26/2019   Procedure: COLONOSCOPY WITH PROPOFOL;  Surgeon: Yetta Flock, MD;   Location: [facility];  Service: Gastroenterology;  Laterality: N/A;  . ENDOSCOPIC MUCOSAL RESECTION N/A 08/26/2019   Procedure: ENDOSCOPIC MUCOSAL RESECTION;  Surgeon: Yetta Flock, MD;  Location: [facility];  Service: Gastroenterology;  Laterality: N/A;  . INGUINAL HERNIA REPAIR Bilateral 05/21/2014   Procedure: OPEN BILATERAL INGUINAL HERNIA REPAIRS WITH MESH;  Surgeon: Donnie Mesa, MD;  Location: [facility];  Service: General;  Laterality: Bilateral;  . POLYPECTOMY  03/25/2019   Procedure: POLYPECTOMY;  Surgeon: Yetta Flock, MD;  Location: [facility];  Service: Gastroenterology;;  . POLYPECTOMY  08/26/2019   Procedure: POLYPECTOMY;  Surgeon: Yetta Flock, MD;  Location: [facility];  Service: Gastroenterology;;  . Woodward Ku  08/26/2019   Procedure: Sprayed methylene blue;  Surgeon: Yetta Flock, MD;  Location: [facility];  Service: Gastroenterology;;  . SMALL INTESTINE SURGERY  1968, 2001    related to Chrohn's disease  . TONSILLECTOMY     Family History  Problem Relation Age of Onset  . Deep vein thrombosis Mother   . Heart disease Father   . Stomach cancer Neg Hx   . Colon cancer Neg Hx   . Esophageal cancer Neg Hx   . Pancreatic cancer Neg Hx   . Rectal cancer Neg Hx   . Colon polyps Neg Hx    Social History   Tobacco Use  . Smoking status: Former Smoker    Quit date: 04/01/1976    Years since quitting: 43.8  . Smokeless tobacco: Never Used  Vaping Use  . Vaping Use: Never used  Substance Use Topics  . Alcohol use: Yes    Alcohol/week: 2.0 standard drinks    Types: 2 Glasses of wine per week    Comment: 3-4  glass wine  weekly  . Drug use: No   Current Outpatient Medications  Medication Sig Dispense Refill  . acetaminophen (TYLENOL) 500 MG tablet Take 500-1,000 mg by mouth every 8 (eight) hours as needed for mild pain or headache.     Marland Kitchen apixaban (ELIQUIS) 5 MG TABS tablet Take 1 tablet (5 mg total) by mouth 2  (two) times daily. 180 tablet 3  . Ascorbic Acid (VITAMIN C) 1000 MG tablet Take 1,000 mg by mouth daily with breakfast.     . 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     . brimonidine (ALPHAGAN) 0.2 % ophthalmic solution Place 1 drop into the left eye See admin instructions. Instill 1 drop into the left eye twice a day- 6:45 AM and 5:50 PM    . budesonide (ENTOCORT EC) 3 MG 24 hr capsule Take by mouth as needed.    . Calcium Carbonate-Vit D-Min (CALCIUM 1200 PO) Take 1,200 mg by mouth daily with breakfast.    . Cholecalciferol (VITAMIN D3) 50 MCG (2000 UT) TABS Take 2,000 Units by mouth daily with breakfast.    . cyanocobalamin (,VITAMIN B-12,) 1000 MCG/ML injection INJECT 1 ML INTO  THE MUSCLE EVERY 30 DAYS 10 mL 1  . dorzolamide-timolol (COSOPT) 22.3-6.8 MG/ML ophthalmic solution Place 1 drop into the left eye See admin instructions. Instill 1 drop into the left eye twice a day- 6:35 AM and 5:30 PM    . ferrous sulfate 325 (65 FE) MG tablet Take 325 mg by mouth every Monday, Wednesday, and Friday.     . latanoprost (XALATAN) 0.005 % ophthalmic solution Place 1 drop into the left eye See admin instructions. Instill 1 drop into the left eye at 6:10 PM daily    . loperamide (IMODIUM A-D) 2 MG tablet Take 2 mg by mouth daily.    . metoprolol succinate (TOPROL XL) 25 MG 24 hr tablet Take 0.5 tablets (12.5 mg total) by mouth at bedtime. 30 tablet 3  . Netarsudil Dimesylate 0.02 % SOLN Place 1 drop into the left eye See admin instructions. Instill 1 drop into the left eye at 6:15 PM daily    . omeprazole (PRILOSEC OTC) 20 MG tablet Take 20 mg by mouth daily as needed (for reflux symptoms).    . prednisoLONE acetate (PRED FORTE) 1 % ophthalmic suspension Place 1 drop into the right eye See admin instructions. Instill 1 drop into the right eye four times a day- 7:00 AM, 12:00 noon, 4:00 PM, and 6:00 PM     . Sodium Sulfate-Mag Sulfate-KCl (SUTAB) [phone] MG TABS Take 1 kit by mouth once for 1 dose. Marland KitchenMANUFACTURER CODES!! BIN: [id number] PCN: CN GROUP: [id number] MEMBER ID: [id number];XBM AS SECONDARY INSURANCE ;NO PRIOR AUTHORIZATION 24 tablet 0  . Syringe/Needle, Disp, (SYRINGE 3CC/25GX1") 25G X 1" 3 ML MISC Use for monthly B12 injections 12 each 0  . tamsulosin (FLOMAX) 0.4 MG CAPS capsule Take 0.4 mg by mouth daily.     No current facility-administered medications for this visit.   No Known Allergies   Review of Systems: All systems reviewed and negative except where noted in HPI.    Lab Results  Component Value Date   WBC 7.2 01/29/2020   HGB 12.3 (L) 01/29/2020   HCT 35.6 (L) 01/29/2020   MCV 92.7 01/29/2020   PLT 206 01/29/2020    Lab Results  Component Value Date   CREATININE 1.16 01/29/2020   BUN 14 01/29/2020   NA 138 01/29/2020   K 4.2 01/29/2020   CL 104 01/29/2020   CO2 26 01/29/2020    Lab Results  Component Value Date   ALT 7 12/24/2019   AST 12 12/24/2019   ALKPHOS 58 12/24/2019   BILITOT 0.7 12/24/2019     Physical Exam: BP 126/64   Pulse 72   Ht 5' 11"  (8.413 m)   Wt 181 lb 3.2 oz (82.2 kg)   BMI 25.27 kg/m  Constitutional: Pleasant,well-developed, male in no acute distress. Abdominal: Soft, nondistended, nontender.  There are no masses palpable.  Extremities: no edema Lymphadenopathy: No cervical adenopathy noted. Neurological: Alert and oriented to person place and time. Skin: Skin is warm and dry. No rashes noted. Psychiatric: Normal mood and affect. Behavior is normal.   ASSESSMENT AND PLAN: 87 y/o male here for reassessment the following:  Crohn's disease / history of colon polyps / AF on anticoagulation - history as outlined above, obstructive Crohn's disease status post 2 surgical resections of small bowel years ago, on no maintenance therapy ever other than steroids as needed for flareups, has had complications from steroids.  In  recent years he has declined biologic therapy, manage flares with budesonide as needed and  has had a few hospitalizations for obstructions but generally has been doing okay in regards to his Crohn's disease.  More concerning has been finding numerous flat adenomas in his transverse and right colon over the past year, since my first exam of him we found over 30 lesions.  His exam is very challenging in light of fair bowel preps, have used methylene blue at the hospital to identify some of these lesions.  He also has adenomatous polyps near or invading the surgical anastomosis which is concerning and increased his risk for developing a malignancy there.  As above I have discussed his case with my GI and surgical colleagues at the IBD multidisciplinary conference.  Given his age and comorbidities the patient has declined an evaluation with surgery which is thought to be high risk for him per the surgeons.  Our goal has been to survey his colon with colonoscopy and remove high risk polyps in hopes of preventing colon cancer in his lifetime.  The question we had spoken about in the past and again today is how frequently to do this and at one point to stop doing it.  I think we have removed the larger lesions at this point however he does have suspected adenomatous involvement of his surgical anastomosis which may not to be possible to completely remove endoscopically given his anatomy. We discussed if he wanted any further exams done at this point in time. Wife and the patient were not comfortable stopping right now and wanted to proceed with another colonoscopy after discussion of options, to be done at the hospital.  We will plan to assess severity of involvement of the anastomosis and hopefully ensure no further high risk lesions that are obvious.  If no high risk lesions noted on this exam we may forego further surveillance or at least reduce the frequency at which we have been doing them.  They understand that is  higher than average risk for bleeding in light of his anticoagulation.  He will need approval to hold Eliquis for 2 days prior to the exam.  He will also need a double prep which he has required in the past for this exam.  Further recommendations pending the results of that exam.  He will continue use budesonide as needed if he has a flare of symptoms.  He has this at home in case he needs it but should contact me if he needs to.  He has been doing quite well in regards to his Crohn's more recently. All questions answered, they agreed with the plan as outlined.  Dundee Cellar, MD [facility]

## 2020-02-04 NOTE — Patient Instructions (Addendum)
If you are age 85 or older, your body mass index should be between 23-30. Your Body mass index is 25.27 kg/m. If this is out of the aforementioned range listed, please consider follow up with your Primary Care Provider.  If you are age 12 or younger, your body mass index should be between 19-25. Your Body mass index is 25.27 kg/m. If this is out of the aformentioned range listed, please consider follow up with your Primary Care Provider.   You have been scheduled for a colonoscopy. Please follow written instructions given to you at your visit today.  Please pick up your prep supplies at the pharmacy within the next 1-3 days. If you use inhalers (even only as needed), please bring them with you on the day of your procedure.  You will be contacted by our office prior to your procedure for directions on holding your Eliquis.  If you do not hear from our office 1 week prior to your scheduled procedure, please call [phone] to discuss.    Thank you for entrusting me with your care and for choosing [facility], Dr. Silverton Cellar

## 2020-02-04 NOTE — Telephone Encounter (Signed)
Sutab codes faxed to [facility]: BIN: [id number], PCN: CN Group: [id number], MEMBER ID: [id number]

## 2020-02-04 NOTE — Telephone Encounter (Signed)
Inbound call from patient's wife stating [facility] pharmacy called them and stated coupon was expired and need an updated one for 2022 resent to them please.

## 2020-02-04 NOTE — Telephone Encounter (Signed)
Patient with diagnosis of atrial fibrillation on Eliquis for anticoagulation.  Additionally, hx of DVT in 2019 treated with Eliquis for 6 months. Then, pt presented to [facility] in September 2021 with a TIA (not on anticoagulation) and monitored detected atrial fibrillation in December 2021 and was started on Eliquis.   Procedure: Colonscopy Date of procedure: 03/19/20  CHA2DS2-VASc Score = 5  This indicates a 7.2% annual risk of stroke. The patient's score is based upon: CHF History: No HTN History: No Diabetes History: No Stroke History: Yes Vascular Disease History: Yes Age Score: 2 Gender Score: 0  CrCl 53 ml/min Platelet count 206K  Per office protocol, patient can hold Eliquis for 1 day prior to procedure.    Recommend patient should restart Eliquis as safely possible given cardiovascular risk.

## 2020-02-05 NOTE — Telephone Encounter (Signed)
   Primary Cardiologist: Lauree Chandler, MD  Chart reviewed as part of pre-operative protocol coverage.   Patient with diagnosis of atrial fibrillation on Eliquis for anticoagulation.  Additionally, hx of DVT in 2019 treated with Eliquis for 6 months. Then, pt presented to [facility] in September 2021 with a TIA (not on anticoagulation) and monitored detected atrial fibrillation in December 2021 and was started on Eliquis.   Procedure: Colonscopy Date of procedure: 03/19/20  CHA2DS2-VASc Score = 5  This indicates a 7.2% annual risk of stroke. The patient's score is based upon: CHF History: No HTN History: No Diabetes History: No Stroke History: Yes Vascular Disease History: Yes Age Score: 2 Gender Score: 0  CrCl 53 ml/min Platelet count 206K  Per MD his Eliquis may be held for 2 days prior to the procedure.  There is a risk of recurrent TIA with extended hold.  Recommend patient should restart Eliquis as safely possible given cardiovascular risk.  I will route this recommendation to the requesting party via Epic fax function and remove from pre-op pool.  Please call with questions.  Jossie Ng. Ashyah Quizon NP-C    02/05/2020, 7:17 AM [place] [address] Office [phone] Fax [phone]

## 2020-02-10 DIAGNOSIS — Z20822 Contact with and (suspected) exposure to covid-19: Secondary | ICD-10-CM | POA: Diagnosis not present

## 2020-02-24 ENCOUNTER — Ambulatory Visit: Payer: Medicare Other | Admitting: Cardiovascular Disease

## 2020-02-24 ENCOUNTER — Other Ambulatory Visit: Payer: Self-pay

## 2020-02-24 ENCOUNTER — Encounter: Payer: Self-pay | Admitting: Cardiovascular Disease

## 2020-02-24 VITALS — BP 114/70 | HR 77 | Ht 71.0 in | Wt 185.0 lb

## 2020-02-24 DIAGNOSIS — I480 Paroxysmal atrial fibrillation: Secondary | ICD-10-CM

## 2020-02-24 NOTE — Patient Instructions (Signed)
Medication Instructions:  No changes *If you need a refill on your cardiac medications before your next appointment, please call your pharmacy*   Lab Work: none If you have labs (blood work) drawn today and your tests are completely normal, you will receive your results only by: Marland Kitchen MyChart Message (if you have MyChart) OR . A paper copy in the mail If you have any lab test that is abnormal or we need to change your treatment, we will call you to review the results.   Testing/Procedures: none   Follow-Up: At [facility], you and your health needs are our priority.  As part of our continuing mission to provide you with exceptional heart care, we have created designated Provider Care Teams.  These Care Teams include your primary Cardiologist (physician) and Advanced Practice Providers (APPs -  Physician Assistants and Nurse Practitioners) who all work together to provide you with the care you need, when you need it.   Your next appointment:   6 month(s)  The format for your next appointment:   In Person  Provider:   You may see Lauree Chandler, MD or one of the following Advanced Practice Providers on your designated Care Team:    Melina Copa, PA-C  Ermalinda Barrios, PA-C    Other Instructions

## 2020-02-24 NOTE — Progress Notes (Signed)
Chief Complaint  Patient presents with  . Follow-up    Atrial fibrillation    History of Present Illness: 87 yo male with history of atrial fibrillation, TIA, memory loss, DVT, adrenal insufficiency, Crohn's disease and mild carotid artery disease here today for cardiac follow up. I saw him as a new patient in November 2021 after he had been admitted to [facility] 09/11/19 with a TIA. Carotid artery dopplers 09/11/19 with mild bilateral carotid artery disease. Echo September 2021 with LVEF=55-60%, no valve disease. CT showed probably remote L thalamic lacunar infarct which was confirmed on MRI brain. Cardiac monitor showed atrial fibrillation. He was started on Eliquis and a beta blocker. He has been followed in the atrial fibrillation clinic.   He is here today for follow up. The patient denies any chest pain, dyspnea, palpitations, lower extremity edema, orthopnea, PND, dizziness, near syncope or syncope. He has not yet started the Toprol. He was worried that it may make him dizzy  Primary Care Physician: Binnie Rail, MD  Past Medical History:  Diagnosis Date  . Adrenal insufficiency ([place])   . Anemia   . Atrial fibrillation ([place])   . Avascular necrosis ([place])   . Clavicle fracture 11/02/2017  . Colon polyp   . Colon polyps   . Crohn's disease ([place])   . Glaucoma   . Inguinal hernia recurrent bilateral   . Low testosterone   . Osteopenia   . Retinal ischemia     Past Surgical History:  Procedure Laterality Date  . ABDOMINAL SURGERY    . COLONOSCOPY N/A 12/13/2015   Procedure: COLONOSCOPY;  Surgeon: Laurence Spates, MD;  Location: [facility];  Service: Endoscopy;  Laterality: N/A;  . COLONOSCOPY WITH PROPOFOL N/A 03/25/2019   Procedure: COLONOSCOPY WITH PROPOFOL;  Surgeon: Yetta Flock, MD;  [facility];  Service: Gastroenterology;  Laterality: N/A;  . COLONOSCOPY WITH PROPOFOL N/A 08/26/2019   Procedure: COLONOSCOPY WITH PROPOFOL;  Surgeon: Yetta Flock, MD;   [facility];  Service: Gastroenterology;  Laterality: N/A;  . ENDOSCOPIC MUCOSAL RESECTION N/A 08/26/2019   Procedure: ENDOSCOPIC MUCOSAL RESECTION;  Surgeon: Yetta Flock, MD;  [facility];  Service: Gastroenterology;  Laterality: N/A;  . INGUINAL HERNIA REPAIR Bilateral 05/21/2014   Procedure: OPEN BILATERAL INGUINAL HERNIA REPAIRS WITH MESH;  Surgeon: Donnie Mesa, MD;  Location: [facility];  Service: General;  Laterality: Bilateral;  . POLYPECTOMY  03/25/2019   Procedure: POLYPECTOMY;  Surgeon: Yetta Flock, MD;  Location: [facility];  Service: Gastroenterology;;  . POLYPECTOMY  08/26/2019   Procedure: POLYPECTOMY;  Surgeon: Yetta Flock, MD;  [facility];  Service: Gastroenterology;;  . Woodward Ku  08/26/2019   Procedure: Sprayed methylene blue;  Surgeon: Yetta Flock, MD;  [facility];  Service: Gastroenterology;;  . SMALL INTESTINE SURGERY  1968, 2001    related to Chrohn's disease  . TONSILLECTOMY      Current Outpatient Medications  Medication Sig Dispense Refill  . acetaminophen (TYLENOL) 500 MG tablet Take 500-1,000 mg by mouth every 8 (eight) hours as needed for mild pain or headache.     Marland Kitchen apixaban (ELIQUIS) 5 MG TABS tablet Take 1 tablet (5 mg total) by mouth 2 (two) times daily. 180 tablet 3  . Ascorbic Acid (VITAMIN C) 1000 MG tablet Take 1,000 mg by mouth daily with breakfast.     . augmented betamethasone dipropionate (DIPROLENE-AF) 0.05 % cream Apply 1 application topically 2 (two) times daily as needed (  Grover's disease).     . brimonidine (ALPHAGAN) 0.2 % ophthalmic solution Place 1 drop into the left eye See admin instructions. Instill 1 drop into the left eye twice a day- 6:45 AM and 5:50 PM    . budesonide (ENTOCORT EC) 3 MG 24 hr capsule Take by mouth as needed.    . Calcium Carbonate-Vit D-Min (CALCIUM 1200 PO) Take 1,200 mg by mouth daily with breakfast.    .  Cholecalciferol (VITAMIN D3) 50 MCG (2000 UT) TABS Take 2,000 Units by mouth daily with breakfast.    . cyanocobalamin (,VITAMIN B-12,) 1000 MCG/ML injection INJECT 1 ML INTO THE MUSCLE EVERY 30 DAYS 10 mL 1  . dorzolamide-timolol (COSOPT) 22.3-6.8 MG/ML ophthalmic solution Place 1 drop into the left eye See admin instructions. Instill 1 drop into the left eye twice a day- 6:35 AM and 5:30 PM    . ferrous sulfate 325 (65 FE) MG tablet Take 325 mg by mouth every Monday, Wednesday, and Friday.     . latanoprost (XALATAN) 0.005 % ophthalmic solution Place 1 drop into the left eye See admin instructions. Instill 1 drop into the left eye at 6:10 PM daily    . loperamide (IMODIUM A-D) 2 MG tablet Take 2 mg by mouth daily.    . Netarsudil Dimesylate 0.02 % SOLN Place 1 drop into the left eye See admin instructions. Instill 1 drop into the left eye at 6:15 PM daily    . omeprazole (PRILOSEC OTC) 20 MG tablet Take 20 mg by mouth daily as needed (for reflux symptoms).    . prednisoLONE acetate (PRED FORTE) 1 % ophthalmic suspension Place 1 drop into the right eye See admin instructions. Instill 1 drop into the right eye four times a day- 7:00 AM, 12:00 noon, 4:00 PM, and 6:00 PM    . Syringe/Needle, Disp, (SYRINGE 3CC/25GX1") 25G X 1" 3 ML MISC Use for monthly B12 injections 12 each 0  . tamsulosin (FLOMAX) 0.4 MG CAPS capsule Take 0.4 mg by mouth daily.    . metoprolol succinate (TOPROL XL) 25 MG 24 hr tablet Take 0.5 tablets (12.5 mg total) by mouth at bedtime. (Patient not taking: Reported on 02/24/2020) 30 tablet 3   No current facility-administered medications for this visit.    No Known Allergies  Social History   Socioeconomic History  . Marital status: Married    Spouse name: Not on file  . Number of children: 3  . Years of education: Not on file  . Highest education level: Not on file  Occupational History  . Occupation: Retired  Tobacco Use  . Smoking status: Former Smoker    Quit date:  04/01/1976    Years since quitting: 43.9  . Smokeless tobacco: Never Used  Vaping Use  . Vaping Use: Never used  Substance and Sexual Activity  . Alcohol use: Yes    Alcohol/week: 2.0 standard drinks    Types: 2 Glasses of wine per week    Comment: 3-4  glass wine  weekly  . Drug use: No  . Sexual activity: Never  Other Topics Concern  . Not on file  Social History Narrative  . Not on file   Social Determinants of Health   Financial Resource Strain: Not on file  Food Insecurity: Not on file  Transportation Needs: Not on file  Physical Activity: Not on file  Stress: Not on file  Social Connections: Not on file  Intimate Partner Violence: Not on file    Family  History  Problem Relation Age of Onset  . Deep vein thrombosis Mother   . Heart disease Father   . Stomach cancer Neg Hx   . Colon cancer Neg Hx   . Esophageal cancer Neg Hx   . Pancreatic cancer Neg Hx   . Rectal cancer Neg Hx   . Colon polyps Neg Hx     Review of Systems:  As stated in the HPI and otherwise negative.   BP 114/70   Pulse 77   Ht 5' 11"  (1.803 m)   Wt 185 lb (83.9 kg)   SpO2 99%   BMI 25.80 kg/m   Physical Examination: General: Well developed, well nourished, NAD  HEENT: OP clear, mucus membranes moist  SKIN: warm, dry. No rashes. Neuro: No focal deficits  Musculoskeletal: Muscle strength 5/5 all ext  Psychiatric: Mood and affect normal  Neck: No JVD, no carotid bruits, no thyromegaly, no lymphadenopathy.  Lungs:Clear bilaterally, no wheezes, rhonci, crackles Cardiovascular: Regular rate and rhythm. No murmurs, gallops or rubs. Abdomen:Soft. Bowel sounds present. Non-tender.  Extremities: No lower extremity edema. Pulses are 2 + in the bilateral DP/PT.  EKG:  EKG is not  ordered today. The ekg ordered today demonstrates    Echo September 2021: 1. Left ventricular ejection fraction, by estimation, is 55 to 60%. The  left ventricle has normal function. The left ventricle has no  regional  wall motion abnormalities. Left ventricular diastolic parameters are  consistent with Grade I diastolic  dysfunction (impaired relaxation).  2. Right ventricular systolic function is normal. The right ventricular  size is normal. There is normal pulmonary artery systolic pressure.  3. Left atrial size was mildly dilated.  4. The mitral valve is normal in structure. No evidence of mitral valve  regurgitation. No evidence of mitral stenosis.  5. The aortic valve is normal in structure. Aortic valve regurgitation is  not visualized. No aortic stenosis is present.  6. The inferior vena cava is normal in size with greater than 50%  respiratory variability, suggesting right atrial pressure of 3 mmHg.  7. Agitated saline contrast bubble study was negative, with no evidence  of any interatrial shunt.   Recent Labs: 12/24/2019: ALT 7; TSH 2.11 01/29/2020: BUN 14; Creatinine, Ser 1.16; Hemoglobin 12.3; Platelets 206; Potassium 4.2; Sodium 138   Lipid Panel    Component Value Date/Time   CHOL 95 12/24/2019 1018   TRIG 158.0 (H) 12/24/2019 1018   HDL 29.70 (L) 12/24/2019 1018   CHOLHDL 3 12/24/2019 1018   VLDL 31.6 12/24/2019 1018   LDLCALC 34 12/24/2019 1018   LDLDIRECT 60.6 09/11/2019 1401     Wt Readings from Last 3 Encounters:  02/24/20 185 lb (83.9 kg)  02/04/20 181 lb 3.2 oz (82.2 kg)  01/29/20 180 lb 6.4 oz (81.8 kg)    Assessment and Plan:   1. Atrial fibrillation, paroxysmal: Rate is controlled. He is doing well on Eliquis. No changes today. He will start Toprol 12.5 mg daily.   Current medicines are reviewed at length with the patient today.  The patient does not have concerns regarding medicines.  The following changes have been made:  no change  Labs/ tests ordered today include:   No orders of the defined types were placed in this encounter.    Disposition:   FU with me in 6 months.    Signed, Lauree Chandler, MD 02/24/2020 3:44 PM    [place], [place]  [address] Phone: [phone]; Fax: [phone]

## 2020-03-13 ENCOUNTER — Other Ambulatory Visit: Payer: Self-pay

## 2020-03-16 ENCOUNTER — Telehealth: Payer: Self-pay | Admitting: Gastroenterology

## 2020-03-16 ENCOUNTER — Other Ambulatory Visit (HOSPITAL_COMMUNITY)
Admission: RE | Admit: 2020-03-16 | Discharge: 2020-03-16 | Disposition: A | Discharge status: Home / Self Care | Payer: Medicare Other | Source: Ambulatory Visit | Attending: Gastroenterology | Admitting: Gastroenterology

## 2020-03-16 DIAGNOSIS — Z20822 Contact with and (suspected) exposure to covid-19: Secondary | ICD-10-CM | POA: Diagnosis not present

## 2020-03-16 DIAGNOSIS — Z01812 Encounter for preprocedural laboratory examination: Secondary | ICD-10-CM | POA: Insufficient documentation

## 2020-03-16 LAB — SARS CORONAVIRUS 2 (TAT 6-24 HRS): SARS Coronavirus 2: NEGATIVE

## 2020-03-16 MED ORDER — SUPREP BOWEL PREP KIT 17.5-3.13-1.6 GM/177ML PO SOLN: ORAL | 0 refills | Status: DC

## 2020-03-16 NOTE — Telephone Encounter (Signed)
Called and spoke to patient's wife, [name].  She would like to use Sutab and they have picked that up at the pharmacy.  Will redo his instructions and email to patient's wife, [name].

## 2020-03-16 NOTE — Telephone Encounter (Signed)
Pt's wife Suanne Marker called stating that seh would like to speak with you about getting new instructions. Pls call her at this number [phone].

## 2020-03-16 NOTE — Telephone Encounter (Signed)
Called patient's wife, [name], and LM that I have sent Suprep to the pharmacy.  They can let me know how well insurance will cover it. I can redo instructions if he would rather use Sutab. Called [facility]. Suprep would be $117. They could return the Sutab if it is unopened.

## 2020-03-16 NOTE — Telephone Encounter (Signed)
Patients wife called and stated that the patient received Sutab but the instructions giving are for Suprep please advise patient has hospital procedure on 03/20/20.

## 2020-03-19 ENCOUNTER — Encounter (HOSPITAL_COMMUNITY)
Admission: RE | Disposition: A | Discharge status: Home / Self Care | Payer: Self-pay | Source: Home / Self Care | Attending: Gastroenterology

## 2020-03-19 ENCOUNTER — Ambulatory Visit (HOSPITAL_COMMUNITY)
Admission: RE | Admit: 2020-03-19 | Discharge: 2020-03-19 | Disposition: A | Discharge status: Home / Self Care | Payer: Medicare Other | Attending: Gastroenterology | Admitting: Gastroenterology

## 2020-03-19 ENCOUNTER — Telehealth: Payer: Self-pay

## 2020-03-19 ENCOUNTER — Ambulatory Visit (HOSPITAL_COMMUNITY): Payer: Medicare Other | Admitting: Certified Registered Nurse Anesthetist

## 2020-03-19 ENCOUNTER — Other Ambulatory Visit: Payer: Self-pay

## 2020-03-19 ENCOUNTER — Encounter (HOSPITAL_COMMUNITY): Payer: Self-pay | Admitting: Gastroenterology

## 2020-03-19 DIAGNOSIS — Z860100 Personal history of colon polyps, unspecified: Secondary | ICD-10-CM

## 2020-03-19 DIAGNOSIS — K635 Polyp of colon: Secondary | ICD-10-CM

## 2020-03-19 DIAGNOSIS — K5090 Crohn's disease, unspecified, without complications: Secondary | ICD-10-CM | POA: Diagnosis not present

## 2020-03-19 DIAGNOSIS — D122 Benign neoplasm of ascending colon: Secondary | ICD-10-CM | POA: Diagnosis not present

## 2020-03-19 DIAGNOSIS — Z980 Intestinal bypass and anastomosis status: Secondary | ICD-10-CM | POA: Diagnosis not present

## 2020-03-19 DIAGNOSIS — K50019 Crohn's disease of small intestine with unspecified complications: Secondary | ICD-10-CM | POA: Diagnosis not present

## 2020-03-19 DIAGNOSIS — K5000 Crohn's disease of small intestine without complications: Secondary | ICD-10-CM | POA: Diagnosis not present

## 2020-03-19 DIAGNOSIS — K529 Noninfective gastroenteritis and colitis, unspecified: Secondary | ICD-10-CM | POA: Diagnosis not present

## 2020-03-19 DIAGNOSIS — Z7901 Long term (current) use of anticoagulants: Secondary | ICD-10-CM | POA: Diagnosis not present

## 2020-03-19 DIAGNOSIS — K6389 Other specified diseases of intestine: Secondary | ICD-10-CM | POA: Diagnosis not present

## 2020-03-19 DIAGNOSIS — Z87891 Personal history of nicotine dependence: Secondary | ICD-10-CM | POA: Diagnosis not present

## 2020-03-19 DIAGNOSIS — Z1211 Encounter for screening for malignant neoplasm of colon: Secondary | ICD-10-CM | POA: Diagnosis not present

## 2020-03-19 DIAGNOSIS — D490 Neoplasm of unspecified behavior of digestive system: Secondary | ICD-10-CM | POA: Diagnosis not present

## 2020-03-19 DIAGNOSIS — K5730 Diverticulosis of large intestine without perforation or abscess without bleeding: Secondary | ICD-10-CM | POA: Diagnosis not present

## 2020-03-19 HISTORY — PX: POLYPECTOMY: SHX5525

## 2020-03-19 HISTORY — PX: COLONOSCOPY WITH PROPOFOL: SHX5780

## 2020-03-19 HISTORY — PX: BIOPSY: SHX5522

## 2020-03-19 HISTORY — DX: Cardiac arrhythmia, unspecified: I49.9

## 2020-03-19 SURGERY — COLONOSCOPY WITH PROPOFOL
Anesthesia: Monitor Anesthesia Care

## 2020-03-19 MED ORDER — LACTATED RINGERS IV SOLN: INTRAVENOUS | Status: DC | PRN

## 2020-03-19 MED ORDER — ONDANSETRON HCL 4 MG/2ML IJ SOLN
INTRAMUSCULAR | Status: DC | PRN
  Administered 2020-03-19: 4 mg via INTRAVENOUS

## 2020-03-19 MED ORDER — METHYLENE BLUE 0.5 % INJ SOLN
INTRAVENOUS | Status: AC
  Filled 2020-03-19: qty 10

## 2020-03-19 MED ORDER — GLUCAGON HCL RDNA (DIAGNOSTIC) 1 MG IJ SOLR
INTRAMUSCULAR | Status: DC | PRN
  Administered 2020-03-19: .5 mg via INTRAVENOUS

## 2020-03-19 MED ORDER — EPHEDRINE SULFATE-NACL 50-0.9 MG/10ML-% IV SOSY
PREFILLED_SYRINGE | INTRAVENOUS | Status: DC | PRN
  Administered 2020-03-19: 5 mg via INTRAVENOUS

## 2020-03-19 MED ORDER — SODIUM CHLORIDE 0.9 % IV SOLN: INTRAVENOUS | Status: DC

## 2020-03-19 MED ORDER — PROPOFOL 500 MG/50ML IV EMUL
INTRAVENOUS | Status: AC
  Filled 2020-03-19: qty 50

## 2020-03-19 MED ORDER — PROPOFOL 10 MG/ML IV BOLUS
INTRAVENOUS | Status: DC | PRN
  Administered 2020-03-19: 125 ug/kg/min via INTRAVENOUS
  Administered 2020-03-19: 20 mg via INTRAVENOUS

## 2020-03-19 SURGICAL SUPPLY — 21 items

## 2020-03-19 NOTE — Telephone Encounter (Signed)
Ambulatory referral to genetics in epic.

## 2020-03-19 NOTE — Anesthesia Procedure Notes (Signed)
Procedure Name: MAC Date/Time: 03/19/2020 11:28 AM Performed by: Maxwell Caul, CRNA Pre-anesthesia Checklist: Patient identified, Emergency Drugs available, Suction available and Patient being monitored Oxygen Delivery Method: Simple face mask

## 2020-03-19 NOTE — H&P (Signed)
HPI :  87 y/o male here for colonoscopy at the hospital for surveillance purposes, history of numerous difficult to remove polyps in the past. He has a history of Crohn's disease, overall doing well from that perspective. Multiple medical problems stable at this time, he denies complaints. Has held Eliquis for 2 days pre-procedure.  Past Medical History:  Diagnosis Date  . Adrenal insufficiency ([place])   . Anemia   . Atrial fibrillation ([place])   . Avascular necrosis ([place])   . Clavicle fracture 11/02/2017  . Colon polyp   . Colon polyps   . Crohn's disease ([place])   . Dysrhythmia   . Glaucoma   . Inguinal hernia recurrent bilateral   . Low testosterone   . Osteopenia   . Retinal ischemia      Past Surgical History:  Procedure Laterality Date  . ABDOMINAL SURGERY    . COLONOSCOPY N/A 12/13/2015   Procedure: COLONOSCOPY;  Surgeon: Laurence Spates, MD;  [facility];  Service: Endoscopy;  Laterality: N/A;  . COLONOSCOPY WITH PROPOFOL N/A 03/25/2019   Procedure: COLONOSCOPY WITH PROPOFOL;  Surgeon: Yetta Flock, MD;  [facility];  Service: Gastroenterology;  Laterality: N/A;  . COLONOSCOPY WITH PROPOFOL N/A 08/26/2019   Procedure: COLONOSCOPY WITH PROPOFOL;  Surgeon: Yetta Flock, MD;  [facility];  Service: Gastroenterology;  Laterality: N/A;  . ENDOSCOPIC MUCOSAL RESECTION N/A 08/26/2019   Procedure: ENDOSCOPIC MUCOSAL RESECTION;  Surgeon: Yetta Flock, MD;  [facility];  Service: Gastroenterology;  Laterality: N/A;  . INGUINAL HERNIA REPAIR Bilateral 05/21/2014   Procedure: OPEN BILATERAL INGUINAL HERNIA REPAIRS WITH MESH;  Surgeon: Donnie Mesa, MD;  Location: [facility];  Service: General;  Laterality: Bilateral;  . POLYPECTOMY  03/25/2019   Procedure: POLYPECTOMY;  Surgeon: Yetta Flock, MD;  [facility];  Service: Gastroenterology;;  . POLYPECTOMY  08/26/2019   Procedure: POLYPECTOMY;   Surgeon: Yetta Flock, MD;  [facility];  Service: Gastroenterology;;  . Woodward Ku  08/26/2019   Procedure: Sprayed methylene blue;  Surgeon: Yetta Flock, MD;  [facility];  Service: Gastroenterology;;  . SMALL INTESTINE SURGERY  1968, 2001    related to Chrohn's disease  . TONSILLECTOMY     Family History  Problem Relation Age of Onset  . Deep vein thrombosis Mother   . Heart disease Father   . Stomach cancer Neg Hx   . Colon cancer Neg Hx   . Esophageal cancer Neg Hx   . Pancreatic cancer Neg Hx   . Rectal cancer Neg Hx   . Colon polyps Neg Hx    Social History   Tobacco Use  . Smoking status: Former Smoker    Quit date: 04/01/1976    Years since quitting: 43.9  . Smokeless tobacco: Never Used  Vaping Use  . Vaping Use: Never used  Substance Use Topics  . Alcohol use: Yes    Alcohol/week: 2.0 standard drinks    Types: 2 Glasses of wine per week    Comment: 3-4  glass wine  weekly  . Drug use: No   Current Facility-Administered Medications  Medication Dose Route Frequency Provider Last Rate Last Admin  . 0.9 %  sodium chloride infusion   Intravenous Continuous Corian Handley, Carlota Raspberry, MD       Not on File   Review of Systems: All systems reviewed and negative except where noted in HPI.    No results found.  Physical Exam: BP Marland Kitchen)  163/59   Pulse 64   Temp 97.6 F (36.4 C) (Oral)   Resp 12   Ht 5' 11"  (1.803 m)   Wt 83 kg   SpO2 95%   BMI 25.52 kg/m  Constitutional: Pleasant,well-developed, male in no acute distress. Cardiovascular: Normal rate, regular rhythm.  Pulmonary/chest: Effort normal and breath sounds normal. No wheezing, rales or rhonchi. Abdominal: Soft, nondistended, nontender.  Neurological: Alert and oriented to person place and time. Psychiatric: Normal mood and affect. Behavior is normal.   ASSESSMENT AND PLAN: 87 y/o male here for surveillance colonoscopy for history of numerous difficult to  remove colon polyps, one of which invades surgical anastomosis. He has declined surgery, have discussed his case at [facility] and discussed options at length with him in the hospital. He wishes to continue with surveillance colonoscopy to reduce polyp size and minimize risk for cancer, although he remains high risk for developing cancer. I have discussed risks / benefits of colonoscopy with him, risks to include bleeding and perforation. Eliquis held for 2 days. He agrees, further recommendation pending the results.   [place] Cellar, MD [facility]

## 2020-03-19 NOTE — Op Note (Signed)
[facility] Patient Name: Michael [name] Procedure Date: 03/19/2020 MRN: [id number] Attending MD: Carlota Raspberry. Takyla Kuchera , MD Date of Birth: Dec 03, 1932 CSN: [id number] Age: 87 Admit Type: Outpatient Procedure:                Colonoscopy Indications:              High risk colon cancer surveillance: Personal                            history of colonic polyps - history of > 30 polyps                            removed from the transverse / right colon in the                            past 2 years, difficult exams in the past due to                            limited prep and colonic spasm. History of very                            subtle polyp near the surgical anastomosis, hard to                            see clear borders, here for surveillance. Off                            Eliquis for 2 days Providers:                Remo Lipps P. Havery Moros, MD, Erenest Rasher, RN,                            Laverda Sorenson, Technician, Courtney Heys [place], CRNA Referring MD:              Medicines:                Monitored Anesthesia Care Complications:            No immediate complications. Estimated blood loss:                            Minimal. Estimated Blood Loss:     Estimated blood loss was minimal. Procedure:                Pre-Anesthesia Assessment:                           - Prior to the procedure, a History and Physical                            was performed, and patient medications and                            allergies were reviewed. The patient's tolerance of  previous anesthesia was also reviewed. The risks                            and benefits of the procedure and the sedation                            options and risks were discussed with the patient.                            All questions were answered, and informed consent                            was obtained. Prior Anticoagulants: The patient has                             taken Eliquis (apixaban), last dose was 2 days                            prior to procedure. ASA Grade Assessment: III - A                            patient with severe systemic disease. After                            reviewing the risks and benefits, the patient was                            deemed in satisfactory condition to undergo the                            procedure.                           After obtaining informed consent, the colonoscope                            was passed under direct vision. Throughout the                            procedure, the patient's blood pressure, pulse, and                            oxygen saturations were monitored continuously. The                            PCF-H190DL ([id number]) Olympus pediatric colonscope                            was introduced through the anus and advanced to the                            the terminal ileum. The colonoscopy was performed  without difficulty. The patient tolerated the                            procedure well. The quality of the bowel                            preparation was adequate. The terminal ileum,                            surgical anastomosis were photographed. Scope In: 11:35:13 AM Scope Out: 12:10:13 PM Scope Withdrawal Time: 0 hours 31 minutes 42 seconds  Total Procedure Duration: 0 hours 35 minutes 0 seconds  Findings:      The perianal and digital rectal examinations were normal.      Diffuse inflammation, graded as Rutgeerts Score i2 (more than five       aphthous lesions with normal intervening mucosa or skip areas of larger       lesions or lesions confined to the ileocolonic anastomosis) and       characterized by erosions and granularity was found in the terminal       ileum.      There was evidence of a prior end-to-end ileo-colonic anastomosis in the       ascending colon. This was patent and was characterized by mild       inflammation. No overt  adenomatous or polypoid tissue at the entrance of       the anastomosis/ ileum, several biopsies taken to make sure of that.      A ? flat polypoid lesion vs. scar vs. benign normal variant was found in       the ascending colon, clear borders hard to visualize, just distal to the       anastomosis. The area was removed with a piecemeal technique using a       cold snare.      Many medium-mouthed diverticula were found in the transverse colon and       left colon.      The exam was otherwise without abnormality. No obvious other polyps       noted. Glucagon given during the exam for spasm. Right colon is       difficult to evaluate near the anastomosis, polyps in the past in that       area have been extremely subtle and hard to see clear borders. Impression:               - Crohn's disease with mildly active ileitis. No                            overt polypoid tissue at the entrance of the ileum                            but multiple biopsies taken.                           - Patent end-to-end ileo-colonic anastomosis.                           - ? polypoid lesion in the ascending colon just  distal to the anastomosis, in close approximation,                            vs. scar tissue or normal variant as above, very                            difficult to see clear borders. Area was removed                            with cold snare,                           - Diverticulosis in the transverse colon and in the                            left colon.                           - The examination was otherwise normal. No high                            risk lesions otherwise appreciated. Moderate Sedation:      No moderate sedation, case performed with MAC Recommendation:           - Patient has a contact number available for                            emergencies. The signs and symptoms of potential                            delayed complications were  discussed with the                            patient. Return to normal activities tomorrow.                            Written discharge instructions were provided to the                            patient.                           - Resume previous diet.                           - Continue present medications.                           - Resume Eliquis tomorrow                           - Await pathology results.                           - Follow up in the office in 6 months Procedure Code(s):        --- Professional ---  45385, Colonoscopy, flexible; with removal of                            tumor(s), polyp(s), or other lesion(s) by snare                            technique                           45380, 19, Colonoscopy, flexible; with biopsy,                            single or multiple Diagnosis Code(s):        --- Professional ---                           Z86.010, Personal history of colonic polyps                           K50.00, Crohn's disease of small intestine without                            complications                           D49.0, Neoplasm of unspecified behavior of                            digestive system                           K57.30, Diverticulosis of large intestine without                            perforation or abscess without bleeding CPT copyright 2019 American Medical Association. All rights reserved. The codes documented in this report are preliminary and upon coder review may  be revised to meet current compliance requirements. Remo Lipps P. Peggye Poon, MD 03/19/2020 12:25:28 PM This report has been signed electronically. Number of Addenda: 0

## 2020-03-19 NOTE — Discharge Instructions (Signed)

## 2020-03-19 NOTE — Anesthesia Postprocedure Evaluation (Signed)
Anesthesia Post Note  Patient: Michael [name]  Procedure(s) Performed: COLONOSCOPY WITH PROPOFOL (N/A ) POLYPECTOMY BIOPSY     Patient location during evaluation: PACU Anesthesia Type: MAC Level of consciousness: awake and alert Pain management: pain level controlled Vital Signs Assessment: post-procedure vital signs reviewed and stable Respiratory status: spontaneous breathing, nonlabored ventilation, respiratory function stable and patient connected to nasal cannula oxygen Cardiovascular status: stable and blood pressure returned to baseline Postop Assessment: no apparent nausea or vomiting Anesthetic complications: no   No complications documented.  Last Vitals:  Vitals:   03/19/20 1023 03/19/20 1222  BP: (!) 163/59 (!) 131/58  Pulse: 64 61  Resp: 12 17  Temp: 36.4 C 36.6 C  SpO2: 95% 100%    Last Pain:  Vitals:   03/19/20 1222  TempSrc: Oral  PainSc: 0-No pain                 Gelsey Amyx S

## 2020-03-19 NOTE — Telephone Encounter (Signed)
-----   Message from Yetta Flock, MD sent at 03/19/2020 12:41 PM EST ----- Regarding: genetics consult [place] I finished colonoscopy for this patient today. Patient's wife inquires about a genetics consult, his children were hoping he could have this done for genetic testing. Can you refer him to genetics for evaluation? Given his age at onset of this polyps, not sure if it will reveal anything but they want to pursue it. Thanks

## 2020-03-19 NOTE — Transfer of Care (Signed)
Immediate Anesthesia Transfer of Care Note  Patient: Michael [name]  Procedure(s) Performed: COLONOSCOPY WITH PROPOFOL (N/A ) POLYPECTOMY BIOPSY  Patient Location: PACU and Endoscopy Unit  Anesthesia Type:MAC  Level of Consciousness: awake, alert  and oriented  Airway & Oxygen Therapy: Patient Spontanous Breathing and Patient connected to face mask oxygen  Post-op Assessment: Report given to RN and Post -op Vital signs reviewed and stable  Post vital signs: Reviewed and stable  Last Vitals:  Vitals Value Taken Time  BP    Temp    Pulse 61 03/19/20 1221  Resp 12 03/19/20 1221  SpO2 99 % 03/19/20 1221  Vitals shown include unvalidated device data.  Last Pain:  Vitals:   03/19/20 1023  TempSrc: Oral  PainSc: 0-No pain         Complications: No complications documented.

## 2020-03-19 NOTE — Anesthesia Preprocedure Evaluation (Signed)
Anesthesia Evaluation  Patient identified by MRN, date of birth, ID band Patient awake    Reviewed: Allergy & Precautions, NPO status , Patient's Chart, lab work & pertinent test results  Airway Mallampati: II  TM Distance: >3 FB Neck ROM: Full    Dental no notable dental hx.    Pulmonary neg pulmonary ROS, former smoker,    Pulmonary exam normal breath sounds clear to auscultation       Cardiovascular negative cardio ROS Normal cardiovascular exam Rhythm:Regular Rate:Normal     Neuro/Psych TIAnegative psych ROS   GI/Hepatic Neg liver ROS, GERD  Medicated,chrohns dz   Endo/Other  negative endocrine ROS  Renal/GU negative Renal ROS  negative genitourinary   Musculoskeletal negative musculoskeletal ROS (+)   Abdominal   Peds negative pediatric ROS (+)  Hematology negative hematology ROS (+)   Anesthesia Other Findings   Reproductive/Obstetrics negative OB ROS                             Anesthesia Physical Anesthesia Plan  ASA: III  Anesthesia Plan: MAC   Post-op Pain Management:    Induction: Intravenous  PONV Risk Score and Plan: 1 and Propofol infusion and Treatment may vary due to age or medical condition  Airway Management Planned: Simple Face Mask  Additional Equipment:   Intra-op Plan:   Post-operative Plan:   Informed Consent: I have reviewed the patients History and Physical, chart, labs and discussed the procedure including the risks, benefits and alternatives for the proposed anesthesia with the patient or authorized representative who has indicated his/her understanding and acceptance.     Dental advisory given  Plan Discussed with: CRNA and Surgeon  Anesthesia Plan Comments:         Anesthesia Quick Evaluation

## 2020-03-19 NOTE — Interval H&P Note (Signed)
History and Physical Interval Note:  03/19/2020 11:27 AM  Michael [name]  has presented today for surgery, with the diagnosis of hx of colon polyps, Crohn's.  The various methods of treatment have been discussed with the patient and family. After consideration of risks, benefits and other options for treatment, the patient has consented to  Procedure(s): COLONOSCOPY WITH PROPOFOL (N/A) as a surgical intervention.  The patient's history has been reviewed, patient examined, no change in status, stable for surgery.  I have reviewed the patient's chart and labs.  Questions were answered to the patient's satisfaction.     [place] Proper

## 2020-03-20 ENCOUNTER — Telehealth: Payer: Self-pay | Admitting: Genetic Counselor

## 2020-03-20 ENCOUNTER — Encounter (HOSPITAL_COMMUNITY): Payer: Self-pay | Admitting: Gastroenterology

## 2020-03-20 LAB — SURGICAL PATHOLOGY

## 2020-03-20 NOTE — Telephone Encounter (Signed)
Received a genetic counseling referral from Dr. Havery Moros for multiple polyps of sigmoid colon. Ms. Holsinger has been cld and scheduled to see Cari on 3/17 at 10am. Pt aware to arrive 15 minutes early.

## 2020-03-24 DIAGNOSIS — H401133 Primary open-angle glaucoma, bilateral, severe stage: Secondary | ICD-10-CM | POA: Diagnosis not present

## 2020-03-26 ENCOUNTER — Encounter: Payer: Self-pay | Admitting: Genetic Counselor

## 2020-03-26 ENCOUNTER — Other Ambulatory Visit: Payer: Self-pay

## 2020-03-26 ENCOUNTER — Inpatient Hospital Stay: Payer: Medicare Other

## 2020-03-26 ENCOUNTER — Inpatient Hospital Stay: Payer: Medicare Other | Attending: Genetic Counselor | Admitting: Genetic Counselor

## 2020-03-26 DIAGNOSIS — Z86010 Personal history of colonic polyps: Secondary | ICD-10-CM

## 2020-03-26 LAB — GENETIC SCREENING ORDER

## 2020-03-26 NOTE — Progress Notes (Signed)
REFERRING PROVIDER: Yetta Flock, MD [address] [place],  [place] [address]  PRIMARY PROVIDER:  Binnie Rail, MD  PRIMARY REASON FOR VISIT:  1. Personal history of colonic polyps    HISTORY OF PRESENT ILLNESS:   Michael [name], a 87 y.o. male, was seen for a [place] cancer genetics consultation at the request of Dr. Havery [name] due to a personal history of polyps.  Michael [name] presents to clinic today, with his wife, Michael [name], to discuss the possibility of a hereditary predisposition to polyps/cancer, to discuss genetic testing, and to further clarify his future cancer risks, as well as potential cancer risks for family members.   Within the past year, over 30 colon polyps were detected during colonoscopies, most of which were tubular adenomas.  Michael [name] has a personal history of Crohn's disease. Michael [name] also has a personal history of skin cancer on his early, which was detected in 2014.  He follows up with dermatology on an annual basis.   CANCER HISTORY:  Oncology History   No history exists.   RISK FACTORS:  Colonoscopy: yes; see history noted above; follow-up colonoscopy in 6 months. Prostate cancer screening: previously; normal Any excessive radiation exposure in the past: no  Past Medical History:  Diagnosis Date  . Adrenal insufficiency ([place])   . Anemia   . Atrial fibrillation ([place])   . Avascular necrosis ([place])   . Clavicle fracture 11/02/2017  . Colon polyp   . Colon polyps   . Crohn's disease ([place])   . Dysrhythmia   . Glaucoma   . Inguinal hernia recurrent bilateral   . Low testosterone   . Osteopenia   . Retinal ischemia     Past Surgical History:  Procedure Laterality Date  . ABDOMINAL SURGERY    . BIOPSY  03/19/2020   Procedure: BIOPSY;  Surgeon: Michael Flock, MD;  Location: [facility];  Service: Gastroenterology;;  . COLONOSCOPY N/A 12/13/2015   Procedure: COLONOSCOPY;  Surgeon: Michael Spates, MD;  Location: [facility];   Service: Endoscopy;  Laterality: N/A;  . COLONOSCOPY WITH PROPOFOL N/A 03/25/2019   Procedure: COLONOSCOPY WITH PROPOFOL;  Surgeon: Michael Flock, MD;  Location: [facility];  Service: Gastroenterology;  Laterality: N/A;  . COLONOSCOPY WITH PROPOFOL N/A 08/26/2019   Procedure: COLONOSCOPY WITH PROPOFOL;  Surgeon: Michael Flock, MD;  [facility];  Service: Gastroenterology;  Laterality: N/A;  . COLONOSCOPY WITH PROPOFOL N/A 03/19/2020   Procedure: COLONOSCOPY WITH PROPOFOL;  Surgeon: Michael Flock, MD;  [facility];  Service: Gastroenterology;  Laterality: N/A;  . ENDOSCOPIC MUCOSAL RESECTION N/A 08/26/2019   Procedure: ENDOSCOPIC MUCOSAL RESECTION;  Surgeon: Michael Flock, MD;  [facility];  Service: Gastroenterology;  Laterality: N/A;  . INGUINAL HERNIA REPAIR Bilateral 05/21/2014   Procedure: OPEN BILATERAL INGUINAL HERNIA REPAIRS WITH MESH;  Surgeon: Michael Mesa, MD;  Location: [facility];  Service: General;  Laterality: Bilateral;  . POLYPECTOMY  03/25/2019   Procedure: POLYPECTOMY;  Surgeon: Michael Flock, MD;  Location: [facility];  Service: Gastroenterology;;  . POLYPECTOMY  08/26/2019   Procedure: POLYPECTOMY;  Surgeon: Michael Flock, MD;  [facility];  Service: Gastroenterology;;  . POLYPECTOMY  03/19/2020   Procedure: POLYPECTOMY;  Surgeon: Michael Flock, MD;  [facility];  Service: Gastroenterology;;  . Woodward Ku  08/26/2019   Procedure: Sprayed methylene blue;  Surgeon: Michael Flock, MD;  Location: [facility];  Service: Gastroenterology;;  . SMALL INTESTINE  SURGERY  1968, 2001    related to Chrohn's disease  . TONSILLECTOMY      Social History   Socioeconomic History  . Marital status: Married    Spouse name: Not on file  . Number of children: 3  . Years of education: Not on file  . Highest education level: Not on file  Occupational History  .  Occupation: Retired  Tobacco Use  . Smoking status: Former Smoker    Quit date: 04/01/1976    Years since quitting: 44.0  . Smokeless tobacco: Never Used  Vaping Use  . Vaping Use: Never used  Substance and Sexual Activity  . Alcohol use: Yes    Alcohol/week: 2.0 standard drinks    Types: 2 Glasses of wine per week    Comment: 3-4  glass wine  weekly  . Drug use: No  . Sexual activity: Never  Other Topics Concern  . Not on file  Social History Narrative  . Not on file   Social Determinants of Health   Financial Resource Strain: Not on file  Food Insecurity: Not on file  Transportation Needs: Not on file  Physical Activity: Not on file  Stress: Not on file  Social Connections: Not on file     FAMILY HISTORY:  We obtained a detailed, 4-generation family history.  Significant diagnoses are listed below: Family History  Problem Relation Age of Onset  . Cancer Maternal Aunt        unknown type; dx after 1  . Cancer Cousin        maternal cousin; unknown type  . Cancer Cousin        paternal cousin; unknown type; dx after 60  . Stomach cancer Neg Hx   . Colon cancer Neg Hx   . Esophageal cancer Neg Hx   . Pancreatic cancer Neg Hx   . Rectal cancer Neg Hx   . Colon polyps Neg Hx     Michael [name] has two sons and one daughter, seven grandchildren, and a couple great grandchildren.  None of have had any cancer.  His son has had a colonoscopy and his daughter plans to have her first colonoscopy in the next year. He does not have any brothers or sisters.  Michael [name] mother died at age 79 without a history of cancer or polyps.  Michael [name] had a maternal aunt and maternal male cousins with an unknown type of cancer.  Michael [name] father died at age 64, without cancer.  Michael [name] has a paternal cousin with an unknown cancer diagnosed after age 26.    Michael [name] is unaware of previous family history of genetic testing for hereditary cancer risks. Patient's maternal  ancestors are of White/Caucasian descent, and paternal ancestors are of White/Caucasian descent. There is no reported Ashkenazi Jewish ancestry. There is no known consanguinity.  GENETIC COUNSELING ASSESSMENT: Michael [name] is a 87 y.o. male with a personal history of polyps which is somewhat suggestive of a hereditary polyposis syndrome and predisposition to cancer given the number of colon polyps in his lifetime. We, therefore, discussed and recommended the following at today's visit.   DISCUSSION: We discussed that polyps in general are common, however, most people have fewer than 5 lifetime polyps.  When an individual has 10 or more polyps we become concerned about an underlying polyposis syndrome.  The most common hereditary polyposis syndromes are Familial Adenomatous Polyposis (FAP), caused by mutations in the APC gene, and MUTYH-Associated Polyposis (MAP), caused by mutations  in the MUTYH gene.  There are other genes that are associated with polyposis, such as NTHL1 and MSH3.  We discussed that testing is beneficial for several reasons, including knowing about cancer risks, identifying potential screening and risk-reduction options that may be appropriate, and to understand if other family members could be at risk for colon polyps and/or cancer and allow them to undergo genetic testing.   We reviewed the characteristics, features and inheritance patterns of hereditary cancer syndromes. We also discussed genetic testing, including the appropriate family members to test, the process of testing, insurance coverage and turn-around-time for results. We discussed the implications of a negative, positive and/or variant of uncertain significant result. We recommended Michael [name] pursue genetic testing for the [facility] Common Hereditary Cancers +RNA Panel.   The Common Hereditary Cancers + RNA Panel offered by [facility] includes sequencing, deletion/duplication, and RNA testing of the following 47 genes: APC,  ATM, AXIN2, BARD1, BMPR1A, BRCA1, BRCA2, BRIP1, CDH1, CDK4*, CDKN2A (p14ARF)*, CDKN2A (p16INK4a)*, CHEK2, CTNNA1, DICER1, EPCAM (Deletion/duplication testing only), GREM1 (promoter region deletion/duplication testing only), KIT, MEN1, MLH1, MSH2, MSH3, MSH6, MUTYH, NBN, NF1, NHTL1, PALB2, PDGFRA*, PMS2, POLD1, POLE, PTEN, RAD50, RAD51C, RAD51D, SDHB, SDHC, SDHD, SMAD4, SMARCA4. STK11, TP53, TSC1, TSC2, and VHL.  The following genes were evaluated for sequence changes only: SDHA and HOXB13 c.251G>A variant only.  RNA analysis is not performed for the * genes.    Based on Michael [name] personal history of colon polyps, he meets medical criteria for genetic testing. Despite that he meets criteria, he may still have an out of pocket cost. We discussed that if his out of pocket cost for testing, the laboratory will reach out to him to discuss self-pay price or patient pay assistance programs.   We discussed that some people do not want to undergo genetic testing due to fear of genetic discrimination.  A federal law called the Genetic Information Non-Discrimination Act (GINA) of 2008 helps protect individuals against genetic discrimination based on their genetic test results.  It impacts both health insurance and employment.  With health insurance, it protects against increased premiums, being kicked off insurance or being forced to take a test in order to be insured.  For employment it protects against hiring, firing and promoting decisions based on genetic test results.  GINA does not apply to those in the [employer], those who work for companies with less than 15 employees, and new life insurance or long-term disability insurance policies.  Health status due to a cancer diagnosis is not protected under GINA.  PLAN: After considering the risks, benefits, and limitations, Michael [name] provided informed consent to pursue genetic testing and the blood sample was sent to [facility] for analysis of the Common  Hereditary Cancers +RNA Panel. Results should be available within approximately 2-3 weeks' time, at which point they will be disclosed by telephone to Michael [name], as will any additional recommendations warranted by these results. Michael [name] will receive a summary of his genetic counseling visit and a copy of his results once available. This information will also be available in Epic.   Lastly, we encouraged Michael [name] to remain in contact with cancer genetics annually so that we can continuously update the family history and inform him of any changes in cancer genetics and testing that may be of benefit for this family.   Michael [name] questions were answered to his satisfaction today. Our contact information was provided should additional questions or concerns arise. Thank you for the referral and  allowing [place] to share in the care of your patient.   Varshini Arrants M. Joette Catching, [place], [facility] Genetic Counselor [email or web address] (P) [phone]   The patient was seen for a total of 40 minutes in face-to-face genetic counseling.  Drs. Magrinat, Lindi Adie and/or Burr Medico were available to discuss this case as needed.  _______________________________________________________________________ For Office Staff:  Number of people involved in session: 1 Was an Intern/ student involved with case: no

## 2020-04-01 NOTE — Telephone Encounter (Signed)
error 

## 2020-04-16 DIAGNOSIS — H1131 Conjunctival hemorrhage, right eye: Secondary | ICD-10-CM | POA: Diagnosis not present

## 2020-04-20 ENCOUNTER — Telehealth: Payer: Self-pay | Admitting: Cardiovascular Disease

## 2020-04-20 NOTE — Telephone Encounter (Signed)
OK to hold Eliquis for several days until he is seen by the surgeon. Gerald Stabs

## 2020-04-20 NOTE — Telephone Encounter (Signed)
Pt wife called in and stated they are down in [place] with kids on spring break and he fell and sliced his arm open this morning about 10am.  They were able to stop the bleeding.  Pt has an appt Thurs to see the surgeon back here in [place].  The surgeon him to call and see if pt can go off his Eliquis tomorrow until his appt Thursday?      Best number- [phone] Marker

## 2020-04-20 NOTE — Telephone Encounter (Signed)
Spoke with wife and he took a fall this morning resulting in a large laceration to the arm.  They were able to get the bleeding almost completely stopped.  Dressed with bandages and just has minimal "oozing" from the wound.  Pt has appt with plastic surgeon on Thursday.  Plastic Surgeon asked them to reach out to our office to see if ok to hold Eliquis until his appt.  Advised I will send to Dr. Angelena Form for review.

## 2020-04-21 NOTE — Telephone Encounter (Signed)
Left detailed message on VM (DPR) ok to hold Eliquis several days until pt sees surgeon on Thursday 04/23/20.  Adv to call back if any other questions/concerns.

## 2020-04-23 ENCOUNTER — Telehealth: Payer: Self-pay | Admitting: *Deleted

## 2020-04-23 ENCOUNTER — Ambulatory Visit: Payer: Medicare Other | Admitting: Plastic Surgery

## 2020-04-23 ENCOUNTER — Other Ambulatory Visit: Payer: Self-pay

## 2020-04-23 ENCOUNTER — Encounter: Payer: Self-pay | Admitting: Plastic Surgery

## 2020-04-23 DIAGNOSIS — S41109A Unspecified open wound of unspecified upper arm, initial encounter: Secondary | ICD-10-CM | POA: Diagnosis not present

## 2020-04-23 NOTE — Progress Notes (Signed)
Patient ID: Michael [name], male    DOB: 09/23/1932, 87 y.o.   MRN: [id number]   Chief Complaint  Patient presents with  . Advice Only  . Skin Problem    The patient is an 87 year old male here for evaluation of his left arm.  Several days ago he was on vacation in [place] and fell.  He had an abrasion to his left arm.  He is on Eliquis.  He had a fair bit of bleeding at the time.  He stopped the Eliquis.  Has been putting ointment on the area since the accident.  He has multiple medical conditions as listed below.  It is ~ 7 x 20 cm in size on the lateral aspect of the left arm.  It is not actively bleeding.  It does not appear to be infected.   Review of Systems  Constitutional: Positive for activity change. Negative for appetite change.  Eyes: Negative.   Respiratory: Negative.   Cardiovascular: Negative.   Gastrointestinal: Negative.   Genitourinary: Negative.   Musculoskeletal: Negative.   Skin: Positive for color change and wound.    Past Medical History:  Diagnosis Date  . Adrenal insufficiency ([place])   . Anemia   . Atrial fibrillation ([place])   . Avascular necrosis ([place])   . Clavicle fracture 11/02/2017  . Colon polyp   . Colon polyps   . Crohn's disease ([place])   . Dysrhythmia   . Glaucoma   . Inguinal hernia recurrent bilateral   . Low testosterone   . Osteopenia   . Retinal ischemia     Past Surgical History:  Procedure Laterality Date  . ABDOMINAL SURGERY    . BIOPSY  03/19/2020   Procedure: BIOPSY;  Surgeon: Yetta Flock, MD;  Location: [facility];  Service: Gastroenterology;;  . COLONOSCOPY N/A 12/13/2015   Procedure: COLONOSCOPY;  Surgeon: Laurence Spates, MD;  [facility]: [facility];  Service: Endoscopy;  Laterality: N/A;  . COLONOSCOPY WITH PROPOFOL N/A 03/25/2019   Procedure: COLONOSCOPY WITH PROPOFOL;  Surgeon: Yetta Flock, MD;  [facility];  Service: Gastroenterology;  Laterality: N/A;  . COLONOSCOPY WITH PROPOFOL N/A  08/26/2019   Procedure: COLONOSCOPY WITH PROPOFOL;  Surgeon: Yetta Flock, MD;  [facility];  Service: Gastroenterology;  Laterality: N/A;  . COLONOSCOPY WITH PROPOFOL N/A 03/19/2020   Procedure: COLONOSCOPY WITH PROPOFOL;  Surgeon: Yetta Flock, MD;  [facility];  Service: Gastroenterology;  Laterality: N/A;  . ENDOSCOPIC MUCOSAL RESECTION N/A 08/26/2019   Procedure: ENDOSCOPIC MUCOSAL RESECTION;  Surgeon: Yetta Flock, MD;  [facility];  Service: Gastroenterology;  Laterality: N/A;  . INGUINAL HERNIA REPAIR Bilateral 05/21/2014   Procedure: OPEN BILATERAL INGUINAL HERNIA REPAIRS WITH MESH;  Surgeon: Donnie Mesa, MD;  Location: [facility];  Service: General;  Laterality: Bilateral;  . POLYPECTOMY  03/25/2019   Procedure: POLYPECTOMY;  Surgeon: Yetta Flock, MD;  Location: [facility];  Service: Gastroenterology;;  . POLYPECTOMY  08/26/2019   Procedure: POLYPECTOMY;  Surgeon: Yetta Flock, MD;  [facility];  Service: Gastroenterology;;  . POLYPECTOMY  03/19/2020   Procedure: POLYPECTOMY;  Surgeon: Yetta Flock, MD;  [facility];  Service: Gastroenterology;;  . Woodward Ku  08/26/2019   Procedure: Sprayed methylene blue;  Surgeon: Yetta Flock, MD;  [facility];  Service: Gastroenterology;;  . SMALL INTESTINE SURGERY  1968, 2001    related to Chrohn's disease  . TONSILLECTOMY  Current Outpatient Medications:  .  acetaminophen (TYLENOL) 500 MG tablet, Take 500-1,000 mg by mouth every 8 (eight) hours as needed for mild pain or headache. , Disp: , Rfl:  .  apixaban (ELIQUIS) 5 MG TABS tablet, Take 1 tablet (5 mg total) by mouth 2 (two) times daily., Disp: 180 tablet, Rfl: 3 .  Ascorbic Acid (VITAMIN C) 1000 MG tablet, Take 1,000 mg by mouth daily with breakfast. , Disp: , Rfl:  .  augmented betamethasone dipropionate (DIPROLENE-AF) 0.05 % cream, Apply 1  application topically 2 (two) times daily as needed (Grover's disease). , Disp: , Rfl:  .  brimonidine (ALPHAGAN) 0.2 % ophthalmic solution, Place 1 drop into the left eye in the morning and at bedtime. Instill 1 drop into the left eye twice a day- 6:45 AM and 5:50 PM, Disp: , Rfl:  .  budesonide (ENTOCORT EC) 3 MG 24 hr capsule, Take 3 mg by mouth daily as needed (crohn's disease)., Disp: , Rfl:  .  Calcium Carbonate-Vit D-Min (CALCIUM 1200 PO), Take 1 tablet by mouth daily with breakfast., Disp: , Rfl:  .  Cholecalciferol (VITAMIN D3) 50 MCG (2000 UT) TABS, Take 2,000 Units by mouth daily with breakfast., Disp: , Rfl:  .  cyanocobalamin (,VITAMIN B-12,) 1000 MCG/ML injection, INJECT 1 ML INTO THE MUSCLE EVERY 30 DAYS (Patient taking differently: Inject 1,000 mcg into the muscle every 30 (thirty) days. INJECT 1 ML INTO THE MUSCLE EVERY 30 DAYS), Disp: 10 mL, Rfl: 1 .  dorzolamide-timolol (COSOPT) 22.3-6.8 MG/ML ophthalmic solution, Place 1 drop into the left eye 2 (two) times daily. Instill 1 drop into the left eye twice a day- 6:35 AM and 5:30 PM, Disp: , Rfl:  .  ferrous sulfate 325 (65 FE) MG tablet, Take 325 mg by mouth 3 (three) times a week. In the morning, Disp: , Rfl:  .  latanoprost (XALATAN) 0.005 % ophthalmic solution, Place 1 drop into the left eye at bedtime. Instill 1 drop into the left eye at 6:10 PM daily, Disp: , Rfl:  .  loperamide (IMODIUM A-D) 2 MG tablet, Take 2 mg by mouth daily., Disp: , Rfl:  .  metoprolol succinate (TOPROL XL) 25 MG 24 hr tablet, Take 0.5 tablets (12.5 mg total) by mouth at bedtime., Disp: 30 tablet, Rfl: 3 .  Netarsudil Dimesylate 0.02 % SOLN, Place 1 drop into the left eye at bedtime. Instill 1 drop into the left eye at 6:15 PM daily, Disp: , Rfl:  .  omeprazole (PRILOSEC OTC) 20 MG tablet, Take 20 mg by mouth daily before breakfast., Disp: , Rfl:  .  prednisoLONE acetate (PRED FORTE) 1 % ophthalmic suspension, Place 1 drop into the right eye in the morning,  at noon, in the evening, and at bedtime. Instill 1 drop into the right eye four times a day- 7:00 AM, 12:00 noon, 4:00 PM, and 6:00 PM, Disp: , Rfl:  .  Syringe/Needle, Disp, (SYRINGE 3CC/25GX1") 25G X 1" 3 ML MISC, Use for monthly B12 injections, Disp: 12 each, Rfl: 0 .  tamsulosin (FLOMAX) 0.4 MG CAPS capsule, Take 0.4 mg by mouth at bedtime., Disp: , Rfl:    Objective:   Vitals:   04/23/20 1501  BP: (!) 177/91  Pulse: 63  SpO2: 95%    Physical Exam Vitals and nursing note reviewed.  Constitutional:      Appearance: Normal appearance.  HENT:     Head: Normocephalic and atraumatic.  Cardiovascular:     Rate and Rhythm:  Normal rate.     Pulses: Normal pulses.  Pulmonary:     Effort: Pulmonary effort is normal. No respiratory distress.  Musculoskeletal:       Arms:  Neurological:     General: No focal deficit present.     Mental Status: He is alert.  Psychiatric:        Mood and Affect: Mood normal.        Behavior: Behavior normal.     Assessment & Plan:  Open wound of upper extremity, unspecified laterality, initial encounter  Donated MatriDerm was applied.  I do not think taking the skin off at this time would be beneficial.  We might need to do that in the future.  If we can leave it on her right now it will not be as painful.  The patient is to use K-Y jelly dressings daily starting in 2 days.  I would Like to see him back in 1 week.   Pictures were obtained of the patient and placed in the chart with the patient's or guardian's permission.   [place], DO

## 2020-04-23 NOTE — Telephone Encounter (Signed)
Faxed orders to [facility] for supplies for the patient.     Supplies:Hydrogel Dressing-Daily                Roll Gauze-4x4-Daily                Gauze-4x4-Daily                Antimicrobial Gauze Sponge-Every other                Adaptic-3x8 in-Daily                Ace Wrap 4 inches-Daily   Confirmation received and copy scanned into the chart.//AB/CMA

## 2020-04-24 DIAGNOSIS — S41109A Unspecified open wound of unspecified upper arm, initial encounter: Secondary | ICD-10-CM | POA: Diagnosis not present

## 2020-04-27 ENCOUNTER — Ambulatory Visit: Payer: Self-pay | Admitting: Genetic Counselor

## 2020-04-27 ENCOUNTER — Telehealth: Payer: Self-pay | Admitting: *Deleted

## 2020-04-27 ENCOUNTER — Encounter: Payer: Self-pay | Admitting: Genetic Counselor

## 2020-04-27 ENCOUNTER — Telehealth: Payer: Self-pay | Admitting: Genetic Counselor

## 2020-04-27 DIAGNOSIS — Z1379 Encounter for other screening for genetic and chromosomal anomalies: Secondary | ICD-10-CM | POA: Insufficient documentation

## 2020-04-27 DIAGNOSIS — Z86010 Personal history of colonic polyps: Secondary | ICD-10-CM

## 2020-04-27 NOTE — Telephone Encounter (Signed)
Received Order Status Notification on (04/24/20) from [facility].    Stating:[facility] has provided service for the patient; no further action is required.//AB/CMA

## 2020-04-27 NOTE — Telephone Encounter (Signed)
Revealed negative genetic testing.  Discussed that we do not know why he has colon polyps. It could be due to a different gene that we are not testing, or maybe our current technology may not be able to pick something up.  It will be important for him to keep in contact with genetics to keep up with whether additional testing may be needed.

## 2020-04-28 ENCOUNTER — Encounter: Payer: Self-pay | Admitting: Genetic Counselor

## 2020-04-28 NOTE — Progress Notes (Signed)
HPI:  Michael [name] was previously seen in the [place] clinic due to a personal history of polyps and concerns regarding a hereditary predisposition to cancer. Please refer to our prior cancer genetics clinic note for more information regarding our discussion, assessment and recommendations, at the time. Michael [name] recent genetic test results were disclosed to him, as were recommendations warranted by these results. These results and recommendations are discussed in more detail below.  CANCER HISTORY:  Within the past year, over 30 colon polyps were detected during colonoscopies, most of which were tubular adenomas.  Michael [name] has a personal history of Crohn's disease. Michael [name] also has a personal history of skin cancer on his early, which was detected in 2014.  He follows up with dermatology on an annual basis.   FAMILY HISTORY:  We obtained a detailed, 4-generation family history.  Significant diagnoses are listed below: Family History  Problem Relation Age of Onset  . Cancer Maternal Aunt        unknown type; dx after 2  . Cancer Cousin        maternal cousin; unknown type  . Cancer Cousin        paternal cousin; unknown type; dx after 69    Michael [name] has two sons and one daughter, seven grandchildren, and a couple great grandchildren.  None of have had any cancer.  His son has had a colonoscopy and his daughter plans to have her first colonoscopy in the next year. He does not have any brothers or sisters.  Michael [name]'s mother died at age 48 without a history of cancer or polyps.  Michael [name] had a maternal aunt and maternal male cousins with an unknown type of cancer.  Michael [name]'s father died at age 49, without cancer.  Michael [name] has a paternal cousin with an unknown cancer diagnosed after age 25.    Michael [name] is unaware of previous family history of genetic testing for hereditary cancer risks. Patient's maternal ancestors are of White/Caucasian  descent, and paternal ancestors are of White/Caucasian descent. There is no reported Ashkenazi Jewish ancestry. There is no known consanguinity.   GENETIC TEST RESULTS: Genetic testing reported out on April 23, 2020.  The [facility] Common Hereditary Cancers +RNA Panel found no pathogenic mutations.  The Common Hereditary Cancers + RNA Panel offered by [facility] includes sequencing, deletion/duplication, and RNA testing of the following 47 genes: APC, ATM, AXIN2, BARD1, BMPR1A, BRCA1, BRCA2, BRIP1, CDH1, CDK4*, CDKN2A (p14ARF)*, CDKN2A (p16INK4a)*, CHEK2, CTNNA1, DICER1, EPCAM (Deletion/duplication testing only), GREM1 (promoter region deletion/duplication testing only), KIT, MEN1, MLH1, MSH2, MSH3, MSH6, MUTYH, NBN, NF1, NHTL1, PALB2, PDGFRA*, PMS2, POLD1, POLE, PTEN, RAD50, RAD51C, RAD51D, SDHB, SDHC, SDHD, SMAD4, SMARCA4. STK11, TP53, TSC1, TSC2, and VHL.  The following genes were evaluated for sequence changes only: SDHA and HOXB13 c.251G>A variant only.  RNA analysis is not performed for the * genes.    The test report has been scanned into EPIC and is located under the Molecular Pathology section of the Results Review tab.  A portion of the result report is included below for reference.     We discussed with Michael [name] that because current genetic testing is not perfect, it is possible there may be a gene mutation in one of these genes that current testing cannot detect, but that chance is small.  We also discussed, that there could be another gene that has not yet been discovered, or that we have not yet tested, that is  responsible for the cancer/polyp diagnoses in the family. It is also possible there is a hereditary cause for the cancer in the family that Michael [name] did not inherit and therefore was not identified in his testing.  Therefore, it is important to remain in touch with cancer genetics in the future so that we can continue to offer Michael [name] the most up to date genetic testing.    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  At this time, it is unknown if this variant is associated with increased cancer risk or if this is a normal finding, but most variants such as this get reclassified to being inconsequential. It should not be used to make medical management decisions. With time, we suspect the lab will determine the significance of this variant, if any. If we do learn more about it, we will try to contact Michael [name] to discuss it further. However, it is important to stay in touch with [place] periodically and keep the address and phone number up to date.  ADDITIONAL GENETIC TESTING: We discussed with Michael [name] that there are other genes that are associated with increased cancer risk that can be analyzed. Should Michael [name] wish to pursue additional genetic testing, we are happy to discuss and coordinate this testing, at any time.    CANCER SCREENING RECOMMENDATIONS: Michael [name] test result is considered negative (normal).  This means that we have not identified a hereditary cause for his personal history of polyps at this time.    This negative genetic test simply tells [place] that we cannot yet define why Michael [name] has had an increased number of colorectal polyps.  Michael [name]'s medical management and screening should be based on the prospect that he  will likely form more colon polyps and should, therefore, undergo more frequent colonoscopy screening at intervals determined by his GI providers.  We also recommended that Michael [name]. Ayoub consider an upper endoscopy periodically.  RECOMMENDATIONS FOR FAMILY MEMBERS:  Individuals in this family might be at some increased risk of developing cancer, over the general population risk, simply due to the family history of cancer.  We recommended women in this family have a yearly mammogram beginning at age 24, or 51 years younger  than the earliest onset of cancer, an annual clinical breast exam, and perform monthly breast self-exams. Women in this family should also have a gynecological exam as recommended by their primary provider.  Michael [name]'s first degree relatives should begin having colonoscopies and should continue colonoscopies in regular intervals recommended by their gastroenterologists.   FOLLOW-UP: Lastly, we discussed with Michael [name]. Akel that cancer genetics is a rapidly advancing field and it is possible that new genetic tests will be appropriate for him and/or his family members in the future. We encouraged him to remain in contact with cancer genetics on an annual basis so we can update his personal and family histories and let him know of advances in cancer genetics that may benefit this family.   Our contact number was provided. Michael [name]. Ohair questions were answered to his satisfaction, and he knows he is welcome to call us at anytime with additional questions or concerns.   Zakara Parkey M. Joette Catching, [place], [facility] Genetic Counselor [email or web address] (P) [phone]

## 2020-05-01 ENCOUNTER — Encounter: Payer: Self-pay | Admitting: Plastic Surgery

## 2020-05-01 ENCOUNTER — Ambulatory Visit (INDEPENDENT_AMBULATORY_CARE_PROVIDER_SITE_OTHER): Payer: Medicare Other | Admitting: Plastic Surgery

## 2020-05-01 ENCOUNTER — Other Ambulatory Visit: Payer: Self-pay

## 2020-05-01 VITALS — BP 172/81 | HR 72

## 2020-05-01 DIAGNOSIS — S41109A Unspecified open wound of unspecified upper arm, initial encounter: Secondary | ICD-10-CM | POA: Diagnosis not present

## 2020-05-01 NOTE — Progress Notes (Signed)
   Subjective:    Patient ID: Michael [name], male    DOB: [birth year], 87 y.o.   MRN: [id number]  The patient is an 87 year old male here for follow-up on his left arm wound.  We placed some Maitri Derm on the area.  It is doing extremely well.  The collar and the skin that was left intact has normalized.  I think that it is going to heal without having to be excised.  There is no sign of infection or remaining hematoma.     Review of Systems  Constitutional: Negative.   Eyes: Negative.   Respiratory: Negative.   Cardiovascular: Negative.   Gastrointestinal: Negative.   Genitourinary: Negative.   Skin: Positive for color change and wound.       Objective:   Physical Exam Vitals and nursing note reviewed.  Constitutional:      Appearance: Normal appearance.  Cardiovascular:     Rate and Rhythm: Normal rate.     Pulses: Normal pulses.  Skin:    Capillary Refill: Capillary refill takes less than 2 seconds.     Findings: Bruising and lesion present.  Neurological:     Mental Status: He is alert. Mental status is at baseline.  Psychiatric:        Mood and Affect: Mood normal.        Behavior: Behavior normal.        Thought Content: Thought content normal.         Assessment & Plan:  The patient is an 86 year old male here for follow-up on his left arm wound.  We placed some Maitri Derm on the area.  It is doing extremely well.  The collar and the skin that was left intact has normalized.  I think that it is going to heal without having to be excised.  I will recommend continuation of the K-Y jelly once a day.  Keep it wrapped.  Follow-up in 2 weeks.  Pictures were obtained of the patient and placed in the chart with the patient's or guardian's permission.

## 2020-05-20 ENCOUNTER — Other Ambulatory Visit: Payer: Self-pay

## 2020-05-20 ENCOUNTER — Ambulatory Visit (INDEPENDENT_AMBULATORY_CARE_PROVIDER_SITE_OTHER): Payer: Medicare Other | Admitting: Surgical

## 2020-05-20 DIAGNOSIS — S41109A Unspecified open wound of unspecified upper arm, initial encounter: Secondary | ICD-10-CM | POA: Diagnosis not present

## 2020-05-20 NOTE — Progress Notes (Signed)
   Referring Provider Binnie Rail, MD [place],  [place] [address]   CC:  Chief Complaint  Patient presents with  . Follow-up       Michael [name] is an 87 y.o. male.  HPI: Patient is an 87 year old male here for follow-up on his left arm wound.  He sustained this wound after falling while on vacation in [place].  Patient reports he is doing well.  He is here with his wife.  Review of Systems General: No fevers or chills  Physical Exam Vitals with BMI 05/01/2020 04/23/2020 03/19/2020  Height - 5' 11"  -  Weight - 183 lbs -  BMI - 34.91 -  Systolic [phone]  Diastolic 81 91 62  Pulse 72 63 59    General:  No acute distress,  Alert and oriented, Non-Toxic, Normal speech and affect Left arm: Left arm wound with complete epithelialization.  No new wounds or no open wounds noted.  No drainage noted      Assessment/Plan  87 year old male with a left arm wound after falling while on vacation in [place] a few weeks ago.  He is doing well.  The wound has completely epithelialized there is no wounds noted.  I recommend Aquaphor or Vaseline daily for 1 week.  He can resume normal activities.  No restrictions.  We discussed the use of sunscreen to prevent discoloration of the skin and to protect the newly formed skin.  All of his questions were answered to his content.    We discussed following up on an as-needed basis.  There is no sign of infection, seroma, hematoma.  Recommend calling with questions or concerns.  Pictures were taken and placed in the patient's chart with patient's permission.  Michael [name] Michael [name] 05/20/2020, 11:36 AM

## 2020-05-25 DIAGNOSIS — L570 Actinic keratosis: Secondary | ICD-10-CM | POA: Diagnosis not present

## 2020-05-25 DIAGNOSIS — L821 Other seborrheic keratosis: Secondary | ICD-10-CM | POA: Diagnosis not present

## 2020-05-25 DIAGNOSIS — Z85828 Personal history of other malignant neoplasm of skin: Secondary | ICD-10-CM | POA: Diagnosis not present

## 2020-06-03 ENCOUNTER — Telehealth: Payer: Self-pay | Admitting: Gastroenterology

## 2020-06-03 NOTE — Telephone Encounter (Signed)
Attempted to reach patient and his wife. No answer, no way to leave a voicemail

## 2020-06-03 NOTE — Telephone Encounter (Signed)
Patients wife called [facility] to [facility] the Budesonide.

## 2020-06-04 MED ORDER — BUDESONIDE 3 MG PO CPEP: ORAL_CAPSULE | ORAL | 0 refills | Status: DC

## 2020-06-04 NOTE — Telephone Encounter (Signed)
Yes okay to refill and keep a taper at home in case he needs it in the future. Thanks

## 2020-06-04 NOTE — Telephone Encounter (Signed)
[facility] to [facility]

## 2020-06-04 NOTE — Telephone Encounter (Signed)
Patient's wife Suanne Marker notifed

## 2020-06-04 NOTE — Telephone Encounter (Signed)
Called and spoke to Patient's wife, Suanne Marker.  Patient  finished his budesonide and is completed out. She indicates that Dr. Havery Moros indicated he should keep a taper on hand incase he has a flare and needs it. They would like the script sent to [facility].  Dr. Domingo Madeira to send a taper of 9 mg once daily for 4 weeks, then 6 mg daily for 2 weeks then 3 mg daily for 2 weeks to be used as needed? Thank you.

## 2020-06-05 DIAGNOSIS — R3915 Urgency of urination: Secondary | ICD-10-CM | POA: Diagnosis not present

## 2020-06-23 DIAGNOSIS — H1131 Conjunctival hemorrhage, right eye: Secondary | ICD-10-CM | POA: Diagnosis not present

## 2020-06-23 DIAGNOSIS — H43822 Vitreomacular adhesion, left eye: Secondary | ICD-10-CM | POA: Diagnosis not present

## 2020-06-23 DIAGNOSIS — Z961 Presence of intraocular lens: Secondary | ICD-10-CM | POA: Diagnosis not present

## 2020-06-23 DIAGNOSIS — H04123 Dry eye syndrome of bilateral lacrimal glands: Secondary | ICD-10-CM | POA: Diagnosis not present

## 2020-06-23 DIAGNOSIS — H35351 Cystoid macular degeneration, right eye: Secondary | ICD-10-CM | POA: Diagnosis not present

## 2020-06-23 DIAGNOSIS — H401133 Primary open-angle glaucoma, bilateral, severe stage: Secondary | ICD-10-CM | POA: Diagnosis not present

## 2020-07-06 ENCOUNTER — Telehealth: Payer: Self-pay | Admitting: Internal Medicine

## 2020-07-06 DIAGNOSIS — Z20822 Contact with and (suspected) exposure to covid-19: Secondary | ICD-10-CM | POA: Diagnosis not present

## 2020-07-06 NOTE — Telephone Encounter (Signed)
Called and spoke with pt wife, she states that pt was tested for COVID and is waiting for results. She states she will keep the office updated on his status.

## 2020-07-06 NOTE — Telephone Encounter (Signed)
If he develops symptoms he should test himself and call us.

## 2020-07-06 NOTE — Telephone Encounter (Signed)
[facility] FYI 6.25.22  ---Caller states he was exposed to Covid yesterday.  Advised home care

## 2020-07-08 DIAGNOSIS — Z20822 Contact with and (suspected) exposure to covid-19: Secondary | ICD-10-CM | POA: Diagnosis not present

## 2020-08-01 ENCOUNTER — Encounter: Payer: Self-pay | Admitting: Gastroenterology

## 2020-08-25 ENCOUNTER — Other Ambulatory Visit (HOSPITAL_COMMUNITY): Payer: Self-pay

## 2020-08-25 MED ORDER — APIXABAN 5 MG PO TABS: 5.0000 mg | ORAL_TABLET | Freq: Two times a day (BID) | ORAL | 0 refills | Status: DC

## 2020-09-10 DIAGNOSIS — Z20822 Contact with and (suspected) exposure to covid-19: Secondary | ICD-10-CM | POA: Diagnosis not present

## 2020-09-22 DIAGNOSIS — H401133 Primary open-angle glaucoma, bilateral, severe stage: Secondary | ICD-10-CM | POA: Diagnosis not present

## 2020-09-23 ENCOUNTER — Other Ambulatory Visit: Payer: Self-pay

## 2020-09-23 ENCOUNTER — Observation Stay (HOSPITAL_COMMUNITY)
Admission: EM | Admit: 2020-09-23 | Discharge: 2020-09-24 | Disposition: A | Discharge status: Home / Self Care | Payer: Medicare Other | Attending: Internal Medicine | Admitting: Internal Medicine

## 2020-09-23 ENCOUNTER — Encounter (HOSPITAL_COMMUNITY): Payer: Self-pay | Admitting: Emergency Medicine

## 2020-09-23 ENCOUNTER — Emergency Department (HOSPITAL_COMMUNITY): Payer: Medicare Other

## 2020-09-23 ENCOUNTER — Observation Stay (HOSPITAL_BASED_OUTPATIENT_CLINIC_OR_DEPARTMENT_OTHER): Payer: Medicare Other

## 2020-09-23 DIAGNOSIS — Z8673 Personal history of transient ischemic attack (TIA), and cerebral infarction without residual deficits: Secondary | ICD-10-CM | POA: Diagnosis present

## 2020-09-23 DIAGNOSIS — Z20822 Contact with and (suspected) exposure to covid-19: Secondary | ICD-10-CM | POA: Diagnosis not present

## 2020-09-23 DIAGNOSIS — I10 Essential (primary) hypertension: Secondary | ICD-10-CM | POA: Insufficient documentation

## 2020-09-23 DIAGNOSIS — R262 Difficulty in walking, not elsewhere classified: Secondary | ICD-10-CM | POA: Diagnosis not present

## 2020-09-23 DIAGNOSIS — G459 Transient cerebral ischemic attack, unspecified: Secondary | ICD-10-CM

## 2020-09-23 DIAGNOSIS — Z87891 Personal history of nicotine dependence: Secondary | ICD-10-CM | POA: Diagnosis not present

## 2020-09-23 DIAGNOSIS — Z79899 Other long term (current) drug therapy: Secondary | ICD-10-CM | POA: Insufficient documentation

## 2020-09-23 DIAGNOSIS — Z743 Need for continuous supervision: Secondary | ICD-10-CM | POA: Diagnosis not present

## 2020-09-23 DIAGNOSIS — Z7901 Long term (current) use of anticoagulants: Secondary | ICD-10-CM | POA: Insufficient documentation

## 2020-09-23 DIAGNOSIS — Y900 Blood alcohol level of less than 20 mg/100 ml: Secondary | ICD-10-CM | POA: Diagnosis not present

## 2020-09-23 DIAGNOSIS — I739 Peripheral vascular disease, unspecified: Secondary | ICD-10-CM | POA: Diagnosis not present

## 2020-09-23 DIAGNOSIS — I480 Paroxysmal atrial fibrillation: Secondary | ICD-10-CM | POA: Diagnosis present

## 2020-09-23 DIAGNOSIS — R29818 Other symptoms and signs involving the nervous system: Secondary | ICD-10-CM | POA: Diagnosis not present

## 2020-09-23 DIAGNOSIS — H409 Unspecified glaucoma: Secondary | ICD-10-CM | POA: Diagnosis present

## 2020-09-23 DIAGNOSIS — R531 Weakness: Secondary | ICD-10-CM | POA: Diagnosis not present

## 2020-09-23 DIAGNOSIS — I639 Cerebral infarction, unspecified: Secondary | ICD-10-CM | POA: Diagnosis not present

## 2020-09-23 DIAGNOSIS — R6889 Other general symptoms and signs: Secondary | ICD-10-CM | POA: Diagnosis not present

## 2020-09-23 DIAGNOSIS — R4781 Slurred speech: Secondary | ICD-10-CM | POA: Diagnosis not present

## 2020-09-23 DIAGNOSIS — R42 Dizziness and giddiness: Secondary | ICD-10-CM

## 2020-09-23 DIAGNOSIS — G319 Degenerative disease of nervous system, unspecified: Secondary | ICD-10-CM | POA: Diagnosis not present

## 2020-09-23 DIAGNOSIS — R2990 Unspecified symptoms and signs involving the nervous system: Secondary | ICD-10-CM

## 2020-09-23 DIAGNOSIS — K5090 Crohn's disease, unspecified, without complications: Secondary | ICD-10-CM | POA: Diagnosis present

## 2020-09-23 DIAGNOSIS — R29810 Facial weakness: Secondary | ICD-10-CM | POA: Diagnosis not present

## 2020-09-23 LAB — URINALYSIS, ROUTINE W REFLEX MICROSCOPIC
Bilirubin Urine: NEGATIVE
Glucose, UA: NEGATIVE mg/dL
Hgb urine dipstick: NEGATIVE
Ketones, ur: NEGATIVE mg/dL
Leukocytes,Ua: NEGATIVE
Nitrite: NEGATIVE
Protein, ur: NEGATIVE mg/dL
Specific Gravity, Urine: 1.011 (ref 1.005–1.030)
pH: 6 (ref 5.0–8.0)

## 2020-09-23 LAB — RAPID URINE DRUG SCREEN, HOSP PERFORMED
Amphetamines: NOT DETECTED
Barbiturates: NOT DETECTED
Benzodiazepines: NOT DETECTED
Cocaine: NOT DETECTED
Opiates: NOT DETECTED
Tetrahydrocannabinol: NOT DETECTED

## 2020-09-23 LAB — CBC
HCT: 34.1 % — ABNORMAL LOW (ref 39.0–52.0)
Hemoglobin: 11.5 g/dL — ABNORMAL LOW (ref 13.0–17.0)
MCH: 32.2 pg (ref 26.0–34.0)
MCHC: 33.7 g/dL (ref 30.0–36.0)
MCV: 95.5 fL (ref 80.0–100.0)
Platelets: 158 10*3/uL (ref 150–400)
RBC: 3.57 MIL/uL — ABNORMAL LOW (ref 4.22–5.81)
RDW: 14.3 % (ref 11.5–15.5)
WBC: 7.2 10*3/uL (ref 4.0–10.5)
nRBC: 0 % (ref 0.0–0.2)

## 2020-09-23 LAB — ECHOCARDIOGRAM COMPLETE
Area-P 1/2: 2.48 cm2
Height: 71 in
S' Lateral: 3.5 cm
Weight: 2880 oz

## 2020-09-23 LAB — COMPREHENSIVE METABOLIC PANEL
ALT: 13 U/L (ref 0–44)
AST: 15 U/L (ref 15–41)
Albumin: 3.1 g/dL — ABNORMAL LOW (ref 3.5–5.0)
Alkaline Phosphatase: 36 U/L — ABNORMAL LOW (ref 38–126)
Anion gap: 6 (ref 5–15)
BUN: 15 mg/dL (ref 8–23)
CO2: 27 mmol/L (ref 22–32)
Calcium: 8.5 mg/dL — ABNORMAL LOW (ref 8.9–10.3)
Chloride: 103 mmol/L (ref 98–111)
Creatinine, Ser: 0.91 mg/dL (ref 0.61–1.24)
GFR, Estimated: >60 mL/min (ref >60)
Glucose, Bld: 93 mg/dL (ref 70–99)
Potassium: 3.8 mmol/L (ref 3.5–5.1)
Sodium: 136 mmol/L (ref 135–145)
Total Bilirubin: 1 mg/dL (ref 0.3–1.2)
Total Protein: 5.9 g/dL — ABNORMAL LOW (ref 6.5–8.1)

## 2020-09-23 LAB — DIFFERENTIAL
Abs Immature Granulocytes: 0.11 10*3/uL — ABNORMAL HIGH (ref 0.00–0.07)
Basophils Absolute: 0 10*3/uL (ref 0.0–0.1)
Basophils Relative: 1 %
Eosinophils Absolute: 0.1 10*3/uL (ref 0.0–0.5)
Eosinophils Relative: 1 %
Immature Granulocytes: 2 %
Lymphocytes Relative: 27 %
Lymphs Abs: 1.9 10*3/uL (ref 0.7–4.0)
Monocytes Absolute: 0.6 10*3/uL (ref 0.1–1.0)
Monocytes Relative: 9 %
Neutro Abs: 4.4 10*3/uL (ref 1.7–7.7)
Neutrophils Relative %: 60 %

## 2020-09-23 LAB — APTT: aPTT: 38 s — ABNORMAL HIGH (ref 24–36)

## 2020-09-23 LAB — I-STAT CHEM 8, ED
BUN: 17 mg/dL (ref 8–23)
Calcium, Ion: 1.09 mmol/L — ABNORMAL LOW (ref 1.15–1.40)
Chloride: 102 mmol/L (ref 98–111)
Creatinine, Ser: 0.9 mg/dL (ref 0.61–1.24)
Glucose, Bld: 90 mg/dL (ref 70–99)
HCT: 33 % — ABNORMAL LOW (ref 39.0–52.0)
Hemoglobin: 11.2 g/dL — ABNORMAL LOW (ref 13.0–17.0)
Potassium: 3.8 mmol/L (ref 3.5–5.1)
Sodium: 138 mmol/L (ref 135–145)
TCO2: 26 mmol/L (ref 22–32)

## 2020-09-23 LAB — ETHANOL: Alcohol, Ethyl (B): <10 mg/dL

## 2020-09-23 LAB — PROTIME-INR
INR: 1.2 (ref 0.8–1.2)
Prothrombin Time: 15.3 s — ABNORMAL HIGH (ref 11.4–15.2)

## 2020-09-23 LAB — RESP PANEL BY RT-PCR (FLU A&B, COVID) ARPGX2
Influenza A by PCR: NEGATIVE
Influenza B by PCR: NEGATIVE
SARS Coronavirus 2 by RT PCR: NEGATIVE

## 2020-09-23 MED ORDER — APIXABAN 5 MG PO TABS
5.0000 mg | ORAL_TABLET | Freq: Two times a day (BID) | ORAL | Status: DC
  Administered 2020-09-23: 5 mg via ORAL
  Filled 2020-09-23: qty 1

## 2020-09-23 MED ORDER — STROKE: EARLY STAGES OF RECOVERY BOOK: Freq: Once | Status: DC

## 2020-09-23 MED ORDER — LATANOPROST 0.005 % OP SOLN
1.0000 [drp] | Freq: Every day | OPHTHALMIC | Status: DC
  Administered 2020-09-23: 1 [drp] via OPHTHALMIC

## 2020-09-23 MED ORDER — TAMSULOSIN HCL 0.4 MG PO CAPS
0.4000 mg | ORAL_CAPSULE | Freq: Every day | ORAL | Status: DC
  Administered 2020-09-23: 0.4 mg via ORAL

## 2020-09-23 MED ORDER — PANTOPRAZOLE SODIUM 20 MG PO TBEC
20.0000 mg | DELAYED_RELEASE_TABLET | Freq: Every day | ORAL | Status: DC
  Administered 2020-09-23: 20 mg via ORAL
  Filled 2020-09-23 (×2): qty 1

## 2020-09-23 MED ORDER — PREDNISOLONE ACETATE 1 % OP SUSP
1.0000 [drp] | Freq: Four times a day (QID) | OPHTHALMIC | Status: DC
  Administered 2020-09-23 (×2): 1 [drp] via OPHTHALMIC

## 2020-09-23 MED ORDER — BUDESONIDE 3 MG PO CPEP: 3.0000 mg | ORAL_CAPSULE | Freq: Every day | ORAL | Status: DC | PRN

## 2020-09-23 MED ORDER — ACETAMINOPHEN 325 MG PO TABS: 650.0000 mg | ORAL_TABLET | ORAL | Status: DC | PRN

## 2020-09-23 MED ORDER — BRIMONIDINE TARTRATE 0.2 % OP SOLN
1.0000 [drp] | Freq: Two times a day (BID) | OPHTHALMIC | Status: DC
  Administered 2020-09-23: 1 [drp] via OPHTHALMIC

## 2020-09-23 MED ORDER — LORAZEPAM 2 MG/ML IJ SOLN
0.5000 mg | Freq: Once | INTRAMUSCULAR | Status: AC | PRN
  Administered 2020-09-23: 0.5 mg via INTRAVENOUS
  Filled 2020-09-23: qty 1

## 2020-09-23 MED ORDER — SENNOSIDES-DOCUSATE SODIUM 8.6-50 MG PO TABS: 1.0000 | ORAL_TABLET | Freq: Every evening | ORAL | Status: DC | PRN

## 2020-09-23 MED ORDER — ACETAMINOPHEN 160 MG/5ML PO SOLN: 650.0000 mg | ORAL | Status: DC | PRN

## 2020-09-23 MED ORDER — MECLIZINE HCL 25 MG PO TABS
25.0000 mg | ORAL_TABLET | Freq: Once | ORAL | Status: AC
  Administered 2020-09-23: 25 mg via ORAL
  Filled 2020-09-23: qty 1

## 2020-09-23 MED ORDER — ACETAMINOPHEN 650 MG RE SUPP: 650.0000 mg | RECTAL | Status: DC | PRN

## 2020-09-23 MED ORDER — SODIUM CHLORIDE 0.9 % IV SOLN: INTRAVENOUS | Status: DC

## 2020-09-23 MED ORDER — DORZOLAMIDE HCL-TIMOLOL MAL 2-0.5 % OP SOLN
1.0000 [drp] | Freq: Two times a day (BID) | OPHTHALMIC | Status: DC
  Administered 2020-09-23: 1 [drp] via OPHTHALMIC

## 2020-09-23 MED ORDER — OMEPRAZOLE MAGNESIUM 20 MG PO TBEC: 20.0000 mg | DELAYED_RELEASE_TABLET | Freq: Every day | ORAL | Status: DC

## 2020-09-23 MED ORDER — NETARSUDIL DIMESYLATE 0.02 % OP SOLN
1.0000 [drp] | Freq: Every day | OPHTHALMIC | Status: DC
  Administered 2020-09-23: 1 [drp] via OPHTHALMIC

## 2020-09-23 NOTE — H&P (Signed)
History and Physical    [name]:[id number] DOB: [birth year] DOA: 09/23/2020  PCP: Binnie Rail, MD Consultants:  [place] Day - cardiology; Jaynee Eagles - neurology; [facility] - GI Patient coming from:  Home - lives with wife; NOK: Wife, [phone]  Chief Complaint: Stroke-like symptoms  HPI: Michael [name] is a 87 y.o. male with medical history significant of afib; Crohn's disease; glaucoma with R visual impairment; and remote TIA 09/2019) presenting with stroke-like symptoms.  He got up about 0200 to the bathroom; he was falling backwards and had difficulty getting up.  He did furniture walking and made it and then went back to bed afterwards.  He went back to sleep and awoke this AM.  He got up again about 630 and had the same issue with similar result.  He got up again about 20 minutes later and got inn the shower; his wife found him there - odd since he took one last night and he was going to play tennis this AM.  He was dizzy and holding on while trying to do ADLs.  Dizziness worsened with change of position.  No n/w/t.  His right face was drooping just a little.     ED Course: Woke up overnight with dizziness.  This AM, he took a shower and couldn't walk, ?R facial droop.  MRI negative.  Neurology consulted - ?TIA.  Very unsteady with ambulation.  Will observe for now for evaluation.  Review of Systems: As per HPI; otherwise review of systems reviewed and negative.   Ambulatory Status:  Ambulates without assistance  COVID Vaccine Status:  Complete plus booster  Past Medical History:  Diagnosis Date   Adrenal insufficiency (HCC)    Anemia    Atrial fibrillation (HCC)    Avascular necrosis (HCC)    Clavicle fracture 11/02/2017   Colon polyp    Colon polyps    Crohn's disease ([place])    Glaucoma    Inguinal hernia recurrent bilateral    Low testosterone    Osteopenia    Retinal ischemia     Past Surgical History:  Procedure Laterality Date   ABDOMINAL SURGERY      BIOPSY  03/19/2020   Procedure: BIOPSY;  Surgeon: Yetta Flock, MD;  Location: [facility];  Service: Gastroenterology;;   COLONOSCOPY N/A 12/13/2015   Procedure: COLONOSCOPY;  Surgeon: Laurence Spates, MD;  Location: [facility];  Service: Endoscopy;  Laterality: N/A;   COLONOSCOPY WITH PROPOFOL N/A 03/25/2019   Procedure: COLONOSCOPY WITH PROPOFOL;  Surgeon: Yetta Flock, MD;  Location: [facility];  Service: Gastroenterology;  Laterality: N/A;   COLONOSCOPY WITH PROPOFOL N/A 08/26/2019   Procedure: COLONOSCOPY WITH PROPOFOL;  Surgeon: Yetta Flock, MD;  Location: [facility];  Service: Gastroenterology;  Laterality: N/A;   COLONOSCOPY WITH PROPOFOL N/A 03/19/2020   Procedure: COLONOSCOPY WITH PROPOFOL;  Surgeon: Yetta Flock, MD;  Location: [facility];  Service: Gastroenterology;  Laterality: N/A;   ENDOSCOPIC MUCOSAL RESECTION N/A 08/26/2019   Procedure: ENDOSCOPIC MUCOSAL RESECTION;  Surgeon: Yetta Flock, MD;  Location: [facility];  Service: Gastroenterology;  Laterality: N/A;   INGUINAL HERNIA REPAIR Bilateral 05/21/2014   Procedure: OPEN BILATERAL INGUINAL HERNIA REPAIRS WITH MESH;  Surgeon: Donnie Mesa, MD;  Location: [facility];  Service: General;  Laterality: Bilateral;   POLYPECTOMY  03/25/2019   Procedure: POLYPECTOMY;  Surgeon: Yetta Flock, MD;  Location: [facility];  Service: Gastroenterology;;   POLYPECTOMY  08/26/2019   Procedure: POLYPECTOMY;  Surgeon: Havery Moros,  Carlota Raspberry, MD;  Location: [facility];  Service: Gastroenterology;;   POLYPECTOMY  03/19/2020   Procedure: POLYPECTOMY;  Surgeon: Yetta Flock, MD;  Location: [facility];  Service: Gastroenterology;;   Woodward Ku  08/26/2019   Procedure: Sprayed methylene blue;  Surgeon: Yetta Flock, MD;  Location: [facility];  Service: Gastroenterology;;   SMALL INTESTINE SURGERY  1968, 2001    related to Chrohn's disease   TONSILLECTOMY       Social History   Socioeconomic History   Marital status: Married    Spouse name: Not on file   Number of children: 3   Years of education: Not on file   Highest education level: Not on file  Occupational History   Occupation: Retired  Tobacco Use   Smoking status: Former    Types: Cigarettes    Quit date: 04/01/1976    Years since quitting: 44.5   Smokeless tobacco: Never  Vaping Use   Vaping Use: Never used  Substance and Sexual Activity   Alcohol use: Yes    Alcohol/week: 2.0 standard drinks    Types: 2 Glasses of wine per week    Comment: 3-4  glass wine  weekly   Drug use: No   Sexual activity: Never  Other Topics Concern   Not on file  Social History Narrative   Not on file   Social Determinants of Health   Financial Resource Strain: Not on file  Food Insecurity: Not on file  Transportation Needs: Not on file  Physical Activity: Not on file  Stress: Not on file  Social Connections: Not on file  Intimate Partner Violence: Not on file    No Known Allergies  Family History  Problem Relation Age of Onset   Deep vein thrombosis Mother    Heart disease Father    Cancer Maternal Aunt        unknown type; dx after 67   Cancer Cousin        maternal cousin; unknown type   Cancer Cousin        paternal cousin; unknown type; dx after 23   Stomach cancer Neg Hx    Colon cancer Neg Hx    Esophageal cancer Neg Hx    Pancreatic cancer Neg Hx    Rectal cancer Neg Hx    Colon polyps Neg Hx     Prior to Admission medications   Medication Sig Start Date End Date Taking? Authorizing Provider  acetaminophen (TYLENOL) 500 MG tablet Take 500-1,000 mg by mouth every 8 (eight) hours as needed for mild pain or headache.     [provider]  apixaban (ELIQUIS) 5 MG TABS tablet Take 1 tablet (5 mg total) by mouth 2 (two) times daily. 08/25/20   Sherran Needs, NP  Ascorbic Acid (VITAMIN C) 1000 MG tablet Take 1,000 mg by mouth daily with breakfast.      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  10/03/17   [provider]  brimonidine (ALPHAGAN) 0.2 % ophthalmic solution Place 1 drop into the left eye in the morning and at bedtime. Instill 1 drop into the left eye twice a day- 6:45 AM and 5:50 PM 11/20/15   [provider]  budesonide (ENTOCORT EC) 3 MG 24 hr capsule Take 3 mg by mouth daily as needed (crohn's disease).    [provider]  Calcium Carbonate-Vit D-Min (CALCIUM 1200 PO)  Take 1 tablet by mouth daily with breakfast.    [provider]  Cholecalciferol (VITAMIN D3) 50 MCG (2000 UT) TABS Take 2,000 Units by mouth daily with breakfast.    [provider]  cyanocobalamin (,VITAMIN B-12,) 1000 MCG/ML injection INJECT 1 ML INTO THE MUSCLE EVERY 30 DAYS Patient taking differently: Inject 1,000 mcg into the muscle every 30 (thirty) days. INJECT 1 ML INTO THE MUSCLE EVERY 30 DAYS 11/13/19   Binnie Rail, MD  dorzolamide-timolol (COSOPT) 22.3-6.8 MG/ML ophthalmic solution Place 1 drop into the left eye 2 (two) times daily. Instill 1 drop into the left eye twice a day- 6:35 AM and 5:30 PM 11/05/15   [provider]  ferrous sulfate 325 (65 FE) MG tablet Take 325 mg by mouth 3 (three) times a week. In the morning    [provider]  latanoprost (XALATAN) 0.005 % ophthalmic solution Place 1 drop into the left eye at bedtime. Instill 1 drop into the left eye at 6:10 PM daily 09/12/17   [provider]  loperamide (IMODIUM A-D) 2 MG tablet Take 2 mg by mouth daily.    [provider]  metoprolol succinate (TOPROL XL) 25 MG 24 hr tablet Take 0.5 tablets (12.5 mg total) by mouth at bedtime. 01/29/20 01/28/21  Sherran Needs, NP  Netarsudil Dimesylate 0.02 % SOLN Place 1 drop into the left eye at bedtime. Instill 1 drop into the left eye at 6:15 PM  daily    [provider]  omeprazole (PRILOSEC OTC) 20 MG tablet Take 20 mg by mouth daily before breakfast.    [provider]  prednisoLONE acetate (PRED FORTE) 1 % ophthalmic suspension Place 1 drop into the right eye in the morning, at noon, in the evening, and at bedtime. Instill 1 drop into the right eye four times a day- 7:00 AM, 12:00 noon, 4:00 PM, and 6:00 PM 08/28/18   [provider]  Syringe/Needle, Disp, (SYRINGE 3CC/25GX1") 25G X 1" 3 ML MISC Use for monthly B12 injections 11/13/19   Burns, Claudina Lick, MD  tamsulosin (FLOMAX) 0.4 MG CAPS capsule Take 0.4 mg by mouth at bedtime. 12/20/19   [provider]    Physical Exam: Vitals:   09/23/20 1715 09/23/20 1730 09/23/20 1745 09/23/20 1830  BP: (!) 156/137 (!) 178/88 (!) 165/86 (!) 156/99  Pulse: 77 70 76 (!) 112  Resp: 18 15 13 20   Temp:      TempSrc:      SpO2: 98% 97% 97% 93%  Weight:      Height:         General:  Appears calm and comfortable and is in NAD Eyes:  PERRL, EOMI, normal lids, iris ENT:  grossly normal hearing, lips & tongue, mmm; appropriate dentition Neck:  no LAD, masses or thyromegaly Cardiovascular:  RRR, no m/r/g. No LE edema.  Respiratory:   CTA bilaterally with no wheezes/rales/rhonchi.  Normal respiratory effort. Abdomen:  soft, NT, ND Back:   normal alignment, no CVAT Skin:  no rash or induration seen on limited exam Musculoskeletal:  grossly normal tone BUE/BLE, good ROM, no bony abnormality Psychiatric:  grossly normal mood and affect, speech fluent and appropriate, AOx3 Neurologic:  CN 2-12 grossly intact, moves all extremities in coordinated fashion, sensation intact    Radiological Exams on Admission: Independently reviewed - see discussion in A/P where applicable  CT HEAD WO CONTRAST  Result Date: 09/23/2020 CLINICAL DATA:  Focal neuro deficit, > 6 hrs, stroke  suspected EXAM: CT HEAD WITHOUT CONTRAST TECHNIQUE: Contiguous axial images were obtained  from the base of the skull through the vertex without intravenous contrast. COMPARISON:  09/11/2019 FINDINGS: Brain: Old left thalamic lacunar infarct. There is atrophy and chronic small vessel disease changes. No acute intracranial abnormality. Specifically, no hemorrhage, hydrocephalus, mass lesion, acute infarction, or significant intracranial injury. Vascular: No hyperdense vessel or unexpected calcification. Skull: No acute calvarial abnormality. Sinuses/Orbits: Mucosal thickening in the right maxillary sinus. No air-fluid levels. Other: None IMPRESSION: Old left thalamic lacunar infarct. Atrophy, chronic microvascular disease. No acute intracranial abnormality. Electronically Signed   By: Rolm Baptise M.D.   On: 09/23/2020 09:18   MR BRAIN WO CONTRAST  Result Date: 09/23/2020 CLINICAL DATA:  Neuro deficit, acute, stroke suspected. Right facial droop, slurred speech, and gait abnormality. EXAM: MRI HEAD WITHOUT CONTRAST TECHNIQUE: Multiplanar, multiecho pulse sequences of the brain and surrounding structures were obtained without intravenous contrast. COMPARISON:  Head CT 09/23/2020 and MRI 09/11/2019 FINDINGS: Brain: There is no evidence of an acute infarct, intracranial hemorrhage, mass, midline shift, or extra-axial fluid collection. T2 hyperintensities in the cerebral white matter bilaterally are unchanged from the prior MRI and are nonspecific but compatible with mild chronic small vessel ischemic disease. Chronic lacunar infarcts are noted in the left thalamus and left caudate nucleus. There is mild-to-moderate generalized cerebral atrophy. Vascular: Major intracranial vascular flow voids are preserved. Skull and upper cervical spine: Unremarkable bone marrow signal. Sinuses/Orbits: Bilateral cataract extraction. Chronic right maxillary sinusitis with sinus atelectasis. No significant mastoid fluid. Other: None. IMPRESSION: 1. No acute intracranial abnormality. 2. Mild chronic small vessel ischemic  disease. Electronically Signed   By: Logan Bores M.D.   On: 09/23/2020 12:48   DG Chest Port 1 View  Result Date: 09/23/2020 CLINICAL DATA:  Right side weakness EXAM: PORTABLE CHEST 1 VIEW COMPARISON:  03/10/2019 FINDINGS: The heart size and mediastinal contours are within normal limits. Both lungs are clear. The visualized skeletal structures are unremarkable. IMPRESSION: No active disease. Electronically Signed   By: Rolm Baptise M.D.   On: 09/23/2020 09:26    EKG: Independently reviewed.  NSR with rate 68; no evidence of acute ischemia   Labs on Admission: I have personally reviewed the available labs and imaging studies at the time of the admission.  Pertinent labs:   Unremarkable BMP Albumin 3.1 WBC 7.2 Hgb 11.5 INR 1.2 UA WNL UDS negative COVID/flu negative   Assessment/Plan Principal Problem:   TIA (transient ischemic attack) Active Problems:   Glaucoma   Crohn's disease (HCC)   PAF (paroxysmal atrial fibrillation) (HCC)   TIA -Patient with difficulty walking and R facial droop - clearly concerning for CVA/TIA -MRI was negative, making CVA unlikely -Will  place in observation status for CVA/TIA evaluation -Telemetry monitoring -Echo -Risk stratification with FLP, A1c -Neurology consult -PT/OT/ST/Nutrition Consults -There may be a component of cognitive impairment; consider outpatient evaluation  Afib -Continue Eliquis -Holding rate controlling Toprol XL for now  HTN -Allow permissive HTN for now -Treat BP only if >220/120, and then with goal of 15% reduction -Hold Toprol XL and plan to restart in 48-72 hours   Crohn's disease -Continue Budesonide  Glaucoma -Vision loss in R eye -Continue Cosopt, Xalatan, Pred Forte   Note: This patient has been tested and is negative for the novel coronavirus COVID-19. He has been fully vaccinated against COVID-19.    DVT prophylaxis:  Eliquis Code Status: DNR - confirmed with patient/family Family  Communication: Wife present throughout evaluation  Disposition Plan:  The patient is from: home  Anticipated d/c is to: home without [facility] services once her cardiology issues have been resolved.  Anticipated d/c date will depend on clinical response to treatment, but possibly as early as tomorrow if he has excellent response to treatment  Patient is currently: acutely ill Consults called: Neurology; PT/OT/ST/Nutrition; [facility] team Admission status: It is my clinical opinion that referral for OBSERVATION is reasonable and necessary in this patient based on the above information provided. The aforementioned taken together are felt to place the patient at high risk for further clinical deterioration. However it is anticipated that the patient may be medically stable for discharge from the hospital within 24 to 48 hours.     Karmen Bongo MD [facility]   How to contact the [facility] Attending or Consulting provider [place] or covering provider during after hours [place], for this patient?  Check the care team in [facility] and look for a) attending/consulting [facility] provider listed and b) the [facility] team listed Log into [email or web address] and use [place]'s universal password to access. If you do not have the password, please contact the hospital operator. Locate the [facility] provider you are looking for under [facility] and page to a number that you can be directly reached. If you still have difficulty reaching the provider, please page the [facility] (Director on Call) for the Hospitalists listed on amion for assistance.   09/23/2020, 6:48 PM

## 2020-09-23 NOTE — Progress Notes (Addendum)
Patient brought home supply of the following eye drops -   Netarsudil dimesylate 0.02% eye drop - Instill 1 drop into the L eye at 1815 daily Dorzolamide-timolol 22.3-6.8 - Instill 1 drop into the L eye BID 0635, 1730 Brimonidine 0.2% ophthalmic solution - Instill 1 drop into L eye BID 0645, 1750 Prednisolone acetate (Pred Forte) 1% ophthalmic suspension - Instill 1 drop into the R eye, four times daily (0700/1200/1600/1800) Latanoprost 0.005% ophthalmic solution - Instill 1 drop into L eye at 1810 daily  The medication name, dose, frequency, expiration date were verified by two pharmacists - Gorden Harms, Laurey Arrow.  Explained [facility]'s home medication policy to patient, but he wanted to use his own home supply.   Laurey Arrow, PharmD PGY1 Pharmacy Resident 09/23/2020  7:37 PM  Please check [email or web address] for unit-specific pharmacy phone numbers.

## 2020-09-23 NOTE — ED Notes (Signed)
Portable xray at bedside.

## 2020-09-23 NOTE — ED Notes (Signed)
Pt transported to CT with this RN 

## 2020-09-23 NOTE — ED Provider Notes (Signed)
[facility] EMERGENCY DEPARTMENT Provider Note   CSN: [id number] Arrival date & time: 09/23/20  8315     History Chief Complaint  Patient presents with   Stroke Symptoms    Michael [name] is a 87 y.o. male.  87 yo M with chief complaints of difficulty ambulating and right-sided facial droop.  This was noted by the wife this morning.  Last seen normal at 9 PM last night.  He had trouble walking to the bathroom had to hold onto the wall to get there.  He does not appreciate any weakness.  Had a stroke about a year ago but he is not sure what deficits he had.  Denies head trauma denies headache or neck pain.  Denies cough congestion or fever denies nausea vomiting or diarrhea denies decreased oral intake denies new medication.  The history is provided by the patient.  Illness Severity:  Moderate Onset quality:  Gradual Duration:  2 hours Timing:  Constant Progression:  Unchanged Chronicity:  New Associated symptoms: no abdominal pain, no chest pain, no congestion, no diarrhea, no fever, no headaches, no myalgias, no rash, no shortness of breath and no vomiting       Past Medical History:  Diagnosis Date   Adrenal insufficiency (HCC)    Anemia    Atrial fibrillation (HCC)    Avascular necrosis (HCC)    Clavicle fracture 11/02/2017   Colon polyp    Colon polyps    Crohn's disease ([place])    Glaucoma    Inguinal hernia recurrent bilateral    Low testosterone    Osteopenia    Retinal ischemia     Patient Active Problem List   Diagnosis Date Noted   Genetic testing 04/27/2020   Open wound of arm 04/23/2020   Aortic atherosclerosis ([place]) 12/24/2019   TIA (transient ischemic attack) 09/11/2019   Personal history of colonic polyps    Benign neoplasm of colon    Overactive bladder 05/06/2019   Memory changes 05/06/2019   Polyp of large intestine    Asymptomatic varicose veins of left lower extremity 12/16/2017   Hyperglycemia 11/02/2017   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   Vitamin D deficiency 11/02/2017   History of DVT of lower extremity, left leg 07/2017 08/08/2017   SBO (small bowel obstruction) ([place]) 07/05/2017   Bilateral hearing loss 05/31/2017   B12 deficiency 02/06/2017   Osteopenia 02/06/2017   Glaucoma 12/12/2015   Crohn's disease ([place]) 12/12/2015   Anemia 12/12/2015   Retinal ischemia 12/06/2011    Past Surgical History:  Procedure Laterality Date   ABDOMINAL SURGERY     BIOPSY  03/19/2020   Procedure: BIOPSY;  Surgeon: Yetta Flock, MD;  Location: [facility];  Service: Gastroenterology;;   COLONOSCOPY N/A 12/13/2015   Procedure: COLONOSCOPY;  Surgeon: Laurence Spates, MD;  Location: [facility];  Service: Endoscopy;  Laterality: N/A;   COLONOSCOPY WITH PROPOFOL N/A 03/25/2019   Procedure: COLONOSCOPY WITH PROPOFOL;  Surgeon: Yetta Flock, MD;  Location: [facility];  Service: Gastroenterology;  Laterality: N/A;   COLONOSCOPY WITH PROPOFOL N/A 08/26/2019   Procedure: COLONOSCOPY WITH PROPOFOL;  Surgeon: Yetta Flock, MD;  Location: [facility];  Service: Gastroenterology;  Laterality: N/A;   COLONOSCOPY WITH PROPOFOL N/A 03/19/2020   Procedure: COLONOSCOPY WITH PROPOFOL;  Surgeon: Yetta Flock, MD;  Location: [facility];  Service: Gastroenterology;  Laterality: N/A;   ENDOSCOPIC MUCOSAL RESECTION N/A 08/26/2019   Procedure: ENDOSCOPIC MUCOSAL RESECTION;  Surgeon: Yetta Flock, MD;  Location: [facility];  Service: Gastroenterology;  Laterality: N/A;   INGUINAL HERNIA REPAIR Bilateral 05/21/2014   Procedure: OPEN BILATERAL INGUINAL HERNIA REPAIRS WITH MESH;  Surgeon: Donnie Mesa, MD;  Location: [place];  Service: General;  Laterality: Bilateral;   POLYPECTOMY  03/25/2019   Procedure: POLYPECTOMY;  Surgeon: Yetta Flock, MD;  Location: [facility];  Service: Gastroenterology;;   POLYPECTOMY  08/26/2019   Procedure: POLYPECTOMY;  Surgeon: Yetta Flock, MD;  Location: [facility];  Service: Gastroenterology;;   POLYPECTOMY  03/19/2020   Procedure: POLYPECTOMY;  Surgeon: Yetta Flock, MD;  Location: [facility];  Service: Gastroenterology;;   Woodward Ku  08/26/2019   Procedure: Sprayed methylene blue;  Surgeon: Yetta Flock, MD;  Location: [facility];  Service: Gastroenterology;;   SMALL INTESTINE SURGERY  1968, 2001    related to Chrohn's disease   TONSILLECTOMY         Family History  Problem Relation Age of Onset   Deep vein thrombosis Mother    Heart disease Father    Cancer Maternal Aunt        unknown type; dx after 65   Cancer Cousin        maternal cousin; unknown type   Cancer Cousin        paternal cousin; unknown type; dx after 67   Stomach cancer Neg Hx    Colon cancer Neg Hx    Esophageal cancer Neg Hx    Pancreatic cancer Neg Hx    Rectal cancer Neg Hx    Colon polyps Neg Hx     Social History   Tobacco Use   Smoking status: Former    Types: Cigarettes    Quit date: 04/01/1976    Years since quitting: 44.5   Smokeless tobacco: Never  Vaping Use   Vaping Use: Never used  Substance Use Topics   Alcohol use: Yes    Alcohol/week: 2.0 standard drinks    Types: 2 Glasses of wine per week    Comment: 3-4  glass wine  weekly   Drug use: No    Home Medications Prior to Admission medications   Medication Sig Start Date End Date Taking? Authorizing Provider  acetaminophen (TYLENOL) 500 MG tablet Take 500-1,000 mg by mouth every 8 (eight) hours as needed for mild pain or headache.     [provider]  apixaban (ELIQUIS) 5 MG TABS tablet Take 1 tablet (5 mg total) by mouth 2 (two) times daily. 08/25/20   Sherran Needs, NP  Ascorbic Acid (VITAMIN C) 1000 MG tablet Take 1,000 mg by mouth daily with breakfast.     [provider]  augmented betamethasone dipropionate (DIPROLENE-AF) 0.05 % cream Apply 1 application topically 2 (two) times daily as needed (Grover's  disease).  10/03/17   [provider]  brimonidine (ALPHAGAN) 0.2 % ophthalmic solution Place 1 drop into the left eye in the morning and at bedtime. Instill 1 drop into the left eye twice a day- 6:45 AM and 5:50 PM 11/20/15   [provider]  budesonide (ENTOCORT EC) 3 MG 24 hr capsule Take 3 mg by mouth daily as needed (crohn's disease).    [provider]  Calcium Carbonate-Vit D-Min (CALCIUM 1200 PO) Take 1 tablet by mouth daily with breakfast.    [provider]  Cholecalciferol (VITAMIN D3) 50 MCG (2000 UT) TABS Take 2,000 Units by mouth daily with breakfast.    [provider]  cyanocobalamin (,VITAMIN B-12,)  1000 MCG/ML injection INJECT 1 ML INTO THE MUSCLE EVERY 30 DAYS Patient taking differently: Inject 1,000 mcg into the muscle every 30 (thirty) days. INJECT 1 ML INTO THE MUSCLE EVERY 30 DAYS 11/13/19   Binnie Rail, MD  dorzolamide-timolol (COSOPT) 22.3-6.8 MG/ML ophthalmic solution Place 1 drop into the left eye 2 (two) times daily. Instill 1 drop into the left eye twice a day- 6:35 AM and 5:30 PM 11/05/15   [provider]  ferrous sulfate 325 (65 FE) MG tablet Take 325 mg by mouth 3 (three) times a week. In the morning    [provider]  latanoprost (XALATAN) 0.005 % ophthalmic solution Place 1 drop into the left eye at bedtime. Instill 1 drop into the left eye at 6:10 PM daily 09/12/17   [provider]  loperamide (IMODIUM A-D) 2 MG tablet Take 2 mg by mouth daily.    [provider]  metoprolol succinate (TOPROL XL) 25 MG 24 hr tablet Take 0.5 tablets (12.5 mg total) by mouth at bedtime. 01/29/20 01/28/21  Sherran Needs, NP  Netarsudil Dimesylate 0.02 % SOLN Place 1 drop into the left eye at bedtime. Instill 1 drop into the left eye at 6:15 PM daily    [provider]  omeprazole (PRILOSEC OTC) 20 MG tablet Take 20 mg by mouth daily before breakfast.    [provider]  prednisoLONE  acetate (PRED FORTE) 1 % ophthalmic suspension Place 1 drop into the right eye in the morning, at noon, in the evening, and at bedtime. Instill 1 drop into the right eye four times a day- 7:00 AM, 12:00 noon, 4:00 PM, and 6:00 PM 08/28/18   [provider]  Syringe/Needle, Disp, (SYRINGE 3CC/25GX1") 25G X 1" 3 ML MISC Use for monthly B12 injections 11/13/19   Burns, Claudina Lick, MD  tamsulosin (FLOMAX) 0.4 MG CAPS capsule Take 0.4 mg by mouth at bedtime. 12/20/19   [provider]    Allergies    Patient has no known allergies.  Review of Systems   Review of Systems  Constitutional:  Negative for chills and fever.  HENT:  Negative for congestion and facial swelling.   Eyes:  Negative for discharge and visual disturbance.  Respiratory:  Negative for shortness of breath.   Cardiovascular:  Negative for chest pain and palpitations.  Gastrointestinal:  Negative for abdominal pain, diarrhea and vomiting.  Musculoskeletal:  Negative for arthralgias and myalgias.  Skin:  Negative for color change and rash.  Neurological:  Negative for tremors, syncope and headaches.       Difficulty walking   Psychiatric/Behavioral:  Negative for confusion and dysphoric mood.    Physical Exam Updated Vital Signs BP (!) 160/97   Pulse 79   Temp 97.7 F (36.5 C) (Oral)   Resp (!) 23   Ht 5' 11"  (1.803 m)   Wt 81.6 kg   SpO2 97%   BMI 25.10 kg/m   Physical Exam Vitals and nursing note reviewed.  Constitutional:      Appearance: He is well-developed.  HENT:     Head: Normocephalic and atraumatic.  Eyes:     Pupils: Pupils are equal, round, and reactive to light.  Neck:     Vascular: No JVD.  Cardiovascular:     Rate and Rhythm: Normal rate and regular rhythm.     Heart sounds: No murmur heard.   No friction rub. No gallop.  Pulmonary:     Effort: No respiratory distress.  Breath sounds: No wheezing.  Abdominal:     General: There is no distension.     Tenderness: There is no  abdominal tenderness. There is no guarding or rebound.  Musculoskeletal:        General: Normal range of motion.     Cervical back: Normal range of motion and neck supple.  Skin:    Coloration: Skin is not pale.     Findings: No rash.  Neurological:     Mental Status: He is alert and oriented to person, place, and time.     GCS: GCS eye subscore is 4. GCS verbal subscore is 5. GCS motor subscore is 6.     Cranial Nerves: Cranial nerves are intact.     Sensory: Sensation is intact.     Coordination: Coordination is intact.     Comments: Very subtle upper and lower extremity weakness on the right compared to the left.  4 out of 5.  Psychiatric:        Behavior: Behavior normal.    ED Results / Procedures / Treatments   Labs (all labs ordered are listed, but only abnormal results are displayed) Labs Reviewed  PROTIME-INR - Abnormal; Notable for the following components:      Result Value   Prothrombin Time 15.3 (*)    All other components within normal limits  APTT - Abnormal; Notable for the following components:   aPTT 38 (*)    All other components within normal limits  CBC - Abnormal; Notable for the following components:   RBC 3.57 (*)    Hemoglobin 11.5 (*)    HCT 34.1 (*)    All other components within normal limits  DIFFERENTIAL - Abnormal; Notable for the following components:   Abs Immature Granulocytes 0.11 (*)    All other components within normal limits  COMPREHENSIVE METABOLIC PANEL - Abnormal; Notable for the following components:   Calcium 8.5 (*)    Total Protein 5.9 (*)    Albumin 3.1 (*)    Alkaline Phosphatase 36 (*)    All other components within normal limits  I-STAT CHEM 8, ED - Abnormal; Notable for the following components:   Calcium, Ion 1.09 (*)    Hemoglobin 11.2 (*)    HCT 33.0 (*)    All other components within normal limits  RESP PANEL BY RT-PCR (FLU A&B, COVID) ARPGX2  ETHANOL  RAPID URINE DRUG SCREEN, HOSP PERFORMED  URINALYSIS, ROUTINE  W REFLEX MICROSCOPIC    EKG EKG Interpretation  Date/Time:  Wednesday September 23 2020 08:49:20 EDT Ventricular Rate:  68 PR Interval:  66 QRS Duration: 101 QT Interval:  414 QTC Calculation: 441 R Axis:   25 Text Interpretation: Sinus rhythm Short PR interval RSR' in V1 or V2, right VCD or RVH No significant change since last tracing Confirmed by Deno Etienne [phone]) on 09/23/2020 9:02:15 AM  Radiology CT HEAD WO CONTRAST  Result Date: 09/23/2020 CLINICAL DATA:  Focal neuro deficit, > 6 hrs, stroke suspected EXAM: CT HEAD WITHOUT CONTRAST TECHNIQUE: Contiguous axial images were obtained from the base of the skull through the vertex without intravenous contrast. COMPARISON:  09/11/2019 FINDINGS: Brain: Old left thalamic lacunar infarct. There is atrophy and chronic small vessel disease changes. No acute intracranial abnormality. Specifically, no hemorrhage, hydrocephalus, mass lesion, acute infarction, or significant intracranial injury. Vascular: No hyperdense vessel or unexpected calcification. Skull: No acute calvarial abnormality. Sinuses/Orbits: Mucosal thickening in the right maxillary sinus. No air-fluid levels. Other: None IMPRESSION: Old left  thalamic lacunar infarct. Atrophy, chronic microvascular disease. No acute intracranial abnormality. Electronically Signed   By: Rolm Baptise M.D.   On: 09/23/2020 09:18   MR BRAIN WO CONTRAST  Result Date: 09/23/2020 CLINICAL DATA:  Neuro deficit, acute, stroke suspected. Right facial droop, slurred speech, and gait abnormality. EXAM: MRI HEAD WITHOUT CONTRAST TECHNIQUE: Multiplanar, multiecho pulse sequences of the brain and surrounding structures were obtained without intravenous contrast. COMPARISON:  Head CT 09/23/2020 and MRI 09/11/2019 FINDINGS: Brain: There is no evidence of an acute infarct, intracranial hemorrhage, mass, midline shift, or extra-axial fluid collection. T2 hyperintensities in the cerebral white matter bilaterally are  unchanged from the prior MRI and are nonspecific but compatible with mild chronic small vessel ischemic disease. Chronic lacunar infarcts are noted in the left thalamus and left caudate nucleus. There is mild-to-moderate generalized cerebral atrophy. Vascular: Major intracranial vascular flow voids are preserved. Skull and upper cervical spine: Unremarkable bone marrow signal. Sinuses/Orbits: Bilateral cataract extraction. Chronic right maxillary sinusitis with sinus atelectasis. No significant mastoid fluid. Other: None. IMPRESSION: 1. No acute intracranial abnormality. 2. Mild chronic small vessel ischemic disease. Electronically Signed   By: Logan Bores M.D.   On: 09/23/2020 12:48   DG Chest Port 1 View  Result Date: 09/23/2020 CLINICAL DATA:  Right side weakness EXAM: PORTABLE CHEST 1 VIEW COMPARISON:  03/10/2019 FINDINGS: The heart size and mediastinal contours are within normal limits. Both lungs are clear. The visualized skeletal structures are unremarkable. IMPRESSION: No active disease. Electronically Signed   By: Rolm Baptise M.D.   On: 09/23/2020 09:26    Procedures Procedures   Medications Ordered in ED Medications  meclizine (ANTIVERT) tablet 25 mg (has no administration in time range)  LORazepam (ATIVAN) injection 0.5 mg (0.5 mg Intravenous Given 09/23/20 1147)    ED Course  I have reviewed the triage vital signs and the nursing notes.  Pertinent labs & imaging results that were available during my care of the patient were reviewed by me and considered in my medical decision making (see chart for details).    MDM Rules/Calculators/A&P                           87 yo M with a cc of difficulty ambulating and had right-sided facial droop was noticed by his wife.  EMS noted some right-sided weakness.  He has very mild right-sided weakness with the upper and lower extremity on my exam.  We will start with a CT scan of the head.  Will discuss with neurology, Dr. Theda Sers, recommends  MRI.   MRI is negative.  Patient attempted to ambulate and unable without significant assistance.  Well off of baseline.  Discussed with neurology who will reassess.  Will discuss with medicine for admission.  The patients results and plan were reviewed and discussed.   Any x-rays performed were independently reviewed by myself.   Differential diagnosis were considered with the presenting HPI.  Medications  meclizine (ANTIVERT) tablet 25 mg (has no administration in time range)  LORazepam (ATIVAN) injection 0.5 mg (0.5 mg Intravenous Given 09/23/20 1147)    Vitals:   09/23/20 1145 09/23/20 1304 09/23/20 1315 09/23/20 1345  BP: (!) 150/76 135/70 (!) 157/79 (!) 160/97  Pulse: 79 69 73 79  Resp: 20 (!) 22 20 (!) 23  Temp:      TempSrc:      SpO2: 99% 97% 99% 97%  Weight:      Height:  Final diagnoses:  Stroke-like symptoms    Admission/ observation were discussed with the admitting physician, patient and/or family and they are comfortable with the plan.    Final Clinical Impression(s) / ED Diagnoses Final diagnoses:  Stroke-like symptoms    Rx / DC Orders ED Discharge Orders     None        Deno Etienne, DO 09/23/20 1417

## 2020-09-23 NOTE — ED Notes (Addendum)
Pt extremely upset about being moved from room 30 to 45. Pt called wife stating we just up and moved him to a rinky dinky room and this is unacceptable. This RN spoke with pt's wife Suanne Marker and explained what had happened. This RN also stated that she attempted to explain the same situation to the pt but the pt stated "he did not believe me nor was he going to hear any of that." Pt's wife attempted to talk pt down and stated to pt "that she would not be coming to get him in the middle of the night." Pt was not pleased and hung up on his wife. Pt stated "he will manage but he is not wearing this stuff." Pt was referring to tele monitor. This RN agreed to take it off for the time being but stated to Pt that, "that would mean we would have to check on him more frequently." Pt stated "do what you have to do but leave me alone for now." This RN left the room. Will continue to monitor.

## 2020-09-23 NOTE — Evaluation (Signed)
Occupational Therapy Evaluation Patient Details Name: Michael [name] MRN: [id number] DOB: Oct 19, 1932 Today's Date: 09/23/2020   History of Present Illness 87 yo M with chief complaints of difficulty ambulating and right-sided facial droop.  PMH includes: Anemia, Atrial fibrillation, Avascular necrosis, Clavicle fracture, Colon polyp, Crohn's disease, Glaucoma, Inguinal hernia recurrent bilateral, Low testosterone, Osteopenia, Retinal ischemia.  IDP:OEUMP is no evidence of an acute infarct, intracranial  hemorrhage, mass, midline shift, or extra-axial fluid collection.   Clinical Impression   Patient admitted for the diagnosis above.  PTA he lives with his spouse, who is able to assist as home.   He was very active playing tennis weekly, walked without an AD, and needed no assist with ADL, IADL, meds, or community mobility.  Deficits are listed below.  Currently he is unsteady, needing up to [place] for lower body ADL, and mobility for balance support.  OT will follow in the acute setting to maximize his functional status, and initially [facility] OT is recommended, but can be changed depending on progress.       Recommendations for follow up therapy are one component of a multi-disciplinary discharge planning process, led by the attending physician.  Recommendations may be updated based on patient status, additional functional criteria and insurance authorization.   Follow Up Recommendations  Home health OT    Equipment Recommendations  None recommended by OT    Recommendations for Other Services       Precautions / Restrictions Precautions Precautions: Fall Precaution Comments: watch BP Restrictions Weight Bearing Restrictions: No      Mobility Bed Mobility Overal bed mobility: Needs Assistance Bed Mobility: Supine to Sit;Sit to Supine     Supine to sit: Supervision Sit to supine: Supervision     Patient Response: Cooperative  Transfers Overall transfer level: Needs assistance    Transfers: Sit to/from Stand;Stand Pivot Transfers Sit to Stand: Min guard Stand pivot transfers: Min assist       General transfer comment: balance support    Balance Overall balance assessment: Needs assistance Sitting-balance support: Feet supported Sitting balance-Leahy Scale: Good     Standing balance support: Single extremity supported Standing balance-Leahy Scale: Poor Standing balance comment: relies on external device and support for balance.                           ADL either performed or assessed with clinical judgement   ADL Overall ADL's : Needs assistance/impaired Eating/Feeding: Independent;Sitting   Grooming: Wash/dry hands;Wash/dry face;Min guard;Standing   Upper Body Bathing: Supervision/ safety;Sitting   Lower Body Bathing: Min guard;Sit to/from stand   Upper Body Dressing : Supervision/safety;Sitting   Lower Body Dressing: Min guard;Sit to/from stand   Toilet Transfer: Minimal assistance Toilet Transfer Details (indicate cue type and reason): balance support pushing IV pole. Toileting- Water quality scientist and Hygiene: Min guard Toileting - Clothing Manipulation Details (indicate cue type and reason): balance support     Functional mobility during ADLs: Minimal assistance General ADL Comments: blance support - tends to lose his balance to the R     Vision Baseline Vision/History: 0 No visual deficits Patient Visual Report: No change from baseline       Perception  [facility]   Praxis  Intact    Pertinent Vitals/Pain Pain Assessment: No/denies pain     Hand Dominance Right   Extremity/Trunk Assessment Upper Extremity Assessment Upper Extremity Assessment: Overall WFL for tasks assessed   Lower Extremity Assessment Lower Extremity Assessment: Defer  to PT evaluation   Cervical / Trunk Assessment Cervical / Trunk Assessment: Normal   Communication Communication Communication: HOH   Cognition Arousal/Alertness:  Awake/alert Behavior During Therapy: WFL for tasks assessed/performed Overall Cognitive Status: Impaired/Different from baseline Area of Impairment: Orientation;Following commands;Safety/judgement                 Orientation Level: Person;Place;Time;Situation     Following Commands: Follows multi-step commands consistently Safety/Judgement: Decreased awareness of safety;Decreased awareness of deficits         General Comments   BP after walking to the bathroom: 175/84    Exercises     Shoulder Instructions      Home Living Family/patient expects to be discharged to:: Private residence Living Arrangements: Spouse/significant other Available Help at Discharge: Family;Available 24 hours/day Type of Home: Other(Comment) (one level condo) Home Access: Level entry     Home Layout: One level     Bathroom Shower/Tub: Occupational psychologist: Handicapped height Bathroom Accessibility: Yes How Accessible: Accessible via walker Home Equipment: [place] - single point;Shower seat;Grab bars - tub/shower;Hand held shower head          Prior Functioning/Environment Level of Independence: Independent        Comments: Continues to be very active, plays tennis.  No assist with ADL, and walks without an AD.        OT Problem List: Impaired balance (sitting and/or standing);Decreased safety awareness      OT Treatment/Interventions: Self-care/ADL training;Balance training;Patient/family education;Therapeutic activities    OT Goals(Current goals can be found in the care plan section) Acute Rehab OT Goals Patient Stated Goal: Return home OT Goal Formulation: With patient Time For Goal Achievement: 10/07/20 Potential to Achieve Goals: Good  OT Frequency: Min 2X/week   Barriers to D/C:    none noted       Co-evaluation              AM-PAC OT "6 Clicks" Daily Activity     Outcome Measure Help from another person eating meals?: None Help from another  person taking care of personal grooming?: None Help from another person toileting, which includes using toliet, bedpan, or urinal?: A Little Help from another person bathing (including washing, rinsing, drying)?: A Little Help from another person to put on and taking off regular upper body clothing?: None Help from another person to put on and taking off regular lower body clothing?: A Little 6 Click Score: 21   End of Session Equipment Utilized During Treatment: Gait belt;Other (comment) (IV pole) Nurse Communication: Mobility status  Activity Tolerance: Patient tolerated treatment well Patient left: in bed;with call bell/phone within reach  OT Visit Diagnosis: Unsteadiness on feet (R26.81)                Time: 6945-0388 OT Time Calculation (min): 17 min Charges:  OT General Charges $OT Visit: 1 Visit OT Evaluation $OT Eval Moderate Complexity: 1 Mod  09/23/2020  RP, OTR/L  Acute Rehabilitation Services  Office:  [phone]   Metta Clines 09/23/2020, 5:02 PM

## 2020-09-23 NOTE — ED Notes (Signed)
Pt transported to MRI 

## 2020-09-23 NOTE — ED Notes (Signed)
Pt ambulated in the hall with assistance. Pt required a two assist when ambulating and could not maintain balance. Pt's wife states that he normally ambulates normal with no assistance with a cane or walker, and plays tennis once a week. Dr. Tyrone Nine aware.

## 2020-09-23 NOTE — Progress Notes (Signed)
  Echocardiogram 2D Echocardiogram has been performed.  Michael [name] 09/23/2020, 5:27 PM

## 2020-09-23 NOTE — Consult Note (Signed)
Neurology stroke Consult H&P  RAEL YO MR# [id number] 09/23/2020   CC: difficulty walking right sided face droop.  History is obtained from: patient, ED staff and chart.  HPI: Michael [name] is a 87 y.o. male PMHx as reviewed below, A-fib (apixaban), stroke 1 year ago without residual deficits, woke around 0200 the morning and noticed difficulty walking. ~0700 his wife noticed right-sided face droop, slurred speech and difficulty walking. He was transported by EMS for further evaluation.  He sates that this morning he felt a little dizzy when he was getting out of bed which he describes as feeling as if his environment was moving. The sensation is provoked by movement and occurs intermittently. At rest, he is asymptomatic.   The patient received lorazepam for MRI and is unsteady.  LKW: 2100 tpa given: No OSW IR Thrombectomy No, low NIHSS Modified Rankin Scale: 0-Completely asymptomatic and back to baseline post- stroke NIHSS: 1  ROS: A complete ROS was performed and is negative except as noted in the HPI. He denies recent illness, N/V, D/F/C, SOB/CP  Past Medical History:  Diagnosis Date   Adrenal insufficiency (HCC)    Anemia    Atrial fibrillation (HCC)    Avascular necrosis (HCC)    Clavicle fracture 11/02/2017   Colon polyp    Colon polyps    Crohn's disease ([place])    Dysrhythmia    Glaucoma    Inguinal hernia recurrent bilateral    Low testosterone    Osteopenia    Retinal ischemia    Family History  Problem Relation Age of Onset   Deep vein thrombosis Mother    Heart disease Father    Cancer Maternal Aunt        unknown type; dx after 71   Cancer Cousin        maternal cousin; unknown type   Cancer Cousin        paternal cousin; unknown type; dx after 55   Stomach cancer Neg Hx    Colon cancer Neg Hx    Esophageal cancer Neg Hx    Pancreatic cancer Neg Hx    Rectal cancer Neg Hx    Colon polyps Neg Hx    Social History:  reports that he quit  smoking about 44 years ago. He has never used smokeless tobacco. He reports current alcohol use of about 2.0 standard drinks per week. He reports that he does not use drugs.  Prior to Admission medications   Medication Sig Start Date End Date Taking? Authorizing Provider  acetaminophen (TYLENOL) 500 MG tablet Take 500-1,000 mg by mouth every 8 (eight) hours as needed for mild pain or headache.     [provider]  apixaban (ELIQUIS) 5 MG TABS tablet Take 1 tablet (5 mg total) by mouth 2 (two) times daily. 08/25/20   Sherran Needs, NP  Ascorbic Acid (VITAMIN C) 1000 MG tablet Take 1,000 mg by mouth daily with breakfast.     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  10/03/17   [provider]  brimonidine (ALPHAGAN) 0.2 % ophthalmic solution Place 1 drop into the left eye in the morning and at bedtime. Instill 1 drop into the left eye twice a day- 6:45 AM and 5:50 PM 11/20/15   [provider]  budesonide (ENTOCORT EC) 3 MG 24 hr capsule Take 3 mg by mouth daily as needed (crohn's disease).  [provider]  Calcium Carbonate-Vit D-Min (CALCIUM 1200 PO) Take 1 tablet by mouth daily with breakfast.    [provider]  Cholecalciferol (VITAMIN D3) 50 MCG (2000 UT) TABS Take 2,000 Units by mouth daily with breakfast.    [provider]  cyanocobalamin (,VITAMIN B-12,) 1000 MCG/ML injection INJECT 1 ML INTO THE MUSCLE EVERY 30 DAYS Patient taking differently: Inject 1,000 mcg into the muscle every 30 (thirty) days. INJECT 1 ML INTO THE MUSCLE EVERY 30 DAYS 11/13/19   Binnie Rail, MD  dorzolamide-timolol (COSOPT) 22.3-6.8 MG/ML ophthalmic solution Place 1 drop into the left eye 2 (two) times daily. Instill 1 drop into the left eye twice a day- 6:35 AM and 5:30 PM 11/05/15   [provider]  ferrous sulfate 325 (65  FE) MG tablet Take 325 mg by mouth 3 (three) times a week. In the morning    [provider]  latanoprost (XALATAN) 0.005 % ophthalmic solution Place 1 drop into the left eye at bedtime. Instill 1 drop into the left eye at 6:10 PM daily 09/12/17   [provider]  loperamide (IMODIUM A-D) 2 MG tablet Take 2 mg by mouth daily.    [provider]  metoprolol succinate (TOPROL XL) 25 MG 24 hr tablet Take 0.5 tablets (12.5 mg total) by mouth at bedtime. 01/29/20 01/28/21  Sherran Needs, NP  Netarsudil Dimesylate 0.02 % SOLN Place 1 drop into the left eye at bedtime. Instill 1 drop into the left eye at 6:15 PM daily    [provider]  omeprazole (PRILOSEC OTC) 20 MG tablet Take 20 mg by mouth daily before breakfast.    [provider]  prednisoLONE acetate (PRED FORTE) 1 % ophthalmic suspension Place 1 drop into the right eye in the morning, at noon, in the evening, and at bedtime. Instill 1 drop into the right eye four times a day- 7:00 AM, 12:00 noon, 4:00 PM, and 6:00 PM 08/28/18   [provider]  Syringe/Needle, Disp, (SYRINGE 3CC/25GX1") 25G X 1" 3 ML MISC Use for monthly B12 injections 11/13/19   Burns, Claudina Lick, MD  tamsulosin (FLOMAX) 0.4 MG CAPS capsule Take 0.4 mg by mouth at bedtime. 12/20/19   [provider]   Exam: Current vital signs: BP (!) 170/86   Pulse 64   Temp 97.7 F (36.5 C) (Oral)   Resp 16   Ht 5' 11"  (1.803 m)   Wt 81.6 kg   SpO2 99%   BMI 25.10 kg/m   Physical Exam  Constitutional: Appears well-developed and well-nourished.  Psych: Affect appropriate to situation Eyes: No scleral injection HENT: No OP obstruction. Head: Normocephalic.  Cardiovascular: Normal rate and regular rhythm.  Respiratory: Effort normal, symmetric excursions bilaterally, no audible wheezing. GI: Soft.  No distension. There is no tenderness.  Skin: WDI  Neuro: Mental Status: Patient is awake, alert, oriented to person, place,  month, year, and situation. Patient is able to give a clear and coherent history. Speech  fluent, intact comprehension and repetition. No signs of aphasia or neglect. Visual Fields are full. Pupils are equal, round, and reactive to light. EOMI without ptosis or diploplia.  Facial sensation is symmetric to temperature Facial movement is mildly asymmetric on right.  Hearing is intact to voice. Uvula midline and palate elevates symmetrically. Shoulder shrug is symmetric. Tongue is midline without atrophy or fasciculations.  Tone is normal. Bulk is normal. 5/5 strength was present in all four extremitates Sensation is symmetric  to light touch and temperature in the arms and legs. Deep Tendon Reflexes: 2+ and symmetric in the biceps and patellae. Toes are downgoing bilaterally. FNF and HKS are intact bilaterally. Slightly unstable on standing but when standing he becomes steady. Orthostatics showed appropriate compensatory response. Romberg (-) Gait - Deferred  I have reviewed labs in epic and the pertinent results are:   Ref. Range 09/23/2020 08:55  Calcium Ionized Latest Ref Range: 1.15 - 1.40 mmol/L 1.09 (L)   I have reviewed the images obtained: NCT head showed no acute ischemic changes, hemorrhage, mass. Old left thalamic lacunar infarct. MRI brain did not show acute intracranial abnormality.  Assessment: Michael [name] is a 87 y.o. male PMHx atrial fibrillation on apixaban, chronic left thalamic stroke with symptoms of TIA now resolved. He does not have significant vascular risk factors and his atrial fibrillation is treated. On exam, he was unstable on standing which improved after prolonged standing. Romberg was negative and there was not nystagmus or other focal neurologic signs. His MRI was negative for acute process and he is his complaint seems to be triggered by movement. Differential diagnosis includes presyncope, acute vestibular syndrome, BPPV, vestibular neuronitis.     Plan: He received lorazepam and with his dizziness he is a fall risk and may need observation. If he improves and is able to ambulate safely he may be discharged with outpatient neurology follow up.   Electronically signed by:  Lynnae Sandhoff, MD Page: [phone] 09/23/2020, 11:07 AM

## 2020-09-23 NOTE — ED Triage Notes (Signed)
Pt BIB [facility] for stroke like sx. Pt has a hx of a stroke 1 year ago with no deficits. 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 Pt takes eliquis. LSN 2100 last PM. Pt woke up at 0200 this AM and noticed abnormal gait. At 0700 wife noticed R sided facial droop, slurred speech, and gait abnormality.

## 2020-09-23 NOTE — ED Notes (Signed)
Tyrone Nine, MD at bedside.

## 2020-09-24 ENCOUNTER — Observation Stay (HOSPITAL_COMMUNITY): Payer: Medicare Other

## 2020-09-24 DIAGNOSIS — G459 Transient cerebral ischemic attack, unspecified: Secondary | ICD-10-CM | POA: Diagnosis not present

## 2020-09-24 DIAGNOSIS — H832X9 Labyrinthine dysfunction, unspecified ear: Secondary | ICD-10-CM | POA: Insufficient documentation

## 2020-09-24 DIAGNOSIS — Z8673 Personal history of transient ischemic attack (TIA), and cerebral infarction without residual deficits: Secondary | ICD-10-CM | POA: Diagnosis not present

## 2020-09-24 DIAGNOSIS — M2669 Other specified disorders of temporomandibular joint: Secondary | ICD-10-CM | POA: Diagnosis not present

## 2020-09-24 DIAGNOSIS — I6521 Occlusion and stenosis of right carotid artery: Secondary | ICD-10-CM | POA: Diagnosis not present

## 2020-09-24 LAB — LIPID PANEL
Cholesterol: 110 mg/dL (ref 0–200)
HDL: 42 mg/dL
LDL Cholesterol: 50 mg/dL (ref 0–99)
Total CHOL/HDL Ratio: 2.6 ratio
Triglycerides: 92 mg/dL
VLDL: 18 mg/dL (ref 0–40)

## 2020-09-24 LAB — HEMOGLOBIN A1C
Hgb A1c MFr Bld: 5.1 % (ref 4.8–5.6)
Mean Plasma Glucose: 99.67 mg/dL

## 2020-09-24 MED ORDER — IOHEXOL 350 MG/ML SOLN
100.0000 mL | Freq: Once | INTRAVENOUS | Status: AC | PRN
  Administered 2020-09-24: 100 mL via INTRAVENOUS

## 2020-09-24 NOTE — ED Notes (Signed)
Wife [phone] would like an update

## 2020-09-24 NOTE — ED Notes (Signed)
Patient amublate walking himself in the hallway

## 2020-09-24 NOTE — Discharge Summary (Signed)
Michael [name] OTR:[id number] DOB: [birth year] DOA: 09/23/2020  PCP: Binnie Rail, MD  Admit date: 09/23/2020  Discharge date: 09/24/2020  Admitted From: Home   Disposition:  Home   Recommendations for Outpatient Follow-up:   Follow up with PCP in 1-2 weeks  PCP Please obtain BMP/CBC, 2 view CXR in 1week,  (see Discharge instructions)   PCP Please follow up on the following pending results:    Home Health: None   Equipment/Devices: None  Consultations: NEuro Discharge Condition: Stable    CODE STATUS: Full    Diet Recommendation: Heart Healthy   Diet Order             Diet Heart Room service appropriate? Yes; Fluid consistency: Thin  Diet effective ____                    Chief Complaint  Patient presents with   Stroke Symptoms     Brief history of present illness from the day of admission and additional interim summary    Michael [name] is a 87 y.o. male with medical history significant of afib; Crohn's disease; glaucoma with R visual impairment; and remote TIA 09/2019) presenting with stroke-like symptoms.  He got up about 0200 to the bathroom; he was falling backwards and had difficulty getting up.  He did furniture walking and made it and then went back to bed afterwards.  He went back to sleep and awoke this AM.  He got up again about 630 and had the same issue with similar result.  He got up again about 20 minutes later and got inn the shower; his wife found him there - odd since he took one last night and he was going to play tennis this AM.  He was dizzy and holding on while trying to do ADLs.  Dizziness worsened with change of position.  No n/w/t. ?? His right face was drooping just a little, was admitted for TIA/CVA workup.                                                                  Hospital Course   TIA -Patient with difficulty walking with ?? R facial droop - concerning for CVA/TIA - MRI , TTE, CTA Head and Neck all negative, making CVA unlikely, he could have had mild BPV.  Currently all symptoms have resolved.  He is already taking Eliquis at baseline which I will continue.  He was seen by Neuro here, completely symptom-free, will DC home with outpatient PCP and neuro follow-up.  Currently he has no symptoms whatsoever.    Afib -Continue Eliquis -Holding rate controlling Toprol XL for now   HTN -resume Home Rx   Crohn's disease -Continue Budesonide   Glaucoma -Vision loss in R eye -Continue Cosopt, Xalatan,  Pred Forte   Discharge diagnosis     Principal Problem:   TIA (transient ischemic attack) Active Problems:   Glaucoma   Crohn's disease (HCC)   PAF (paroxysmal atrial fibrillation) [facility])    Discharge instructions    Discharge Instructions     Discharge instructions   Complete by: As directed    Follow with Primary MD Binnie Rail, MD in 7 days   Get CBC, CMP, 2 view Chest X ray -  checked next visit within 1 week by Primary MD    Activity: As tolerated with Full fall precautions use walker/cane & assistance as needed  Disposition Home    Diet: Heart Healthy   Special Instructions: If you have smoked or chewed Tobacco  in the last 2 yrs please stop smoking, stop any regular Alcohol  and or any Recreational drug use.  On your next visit with your primary care physician please Get Medicines reviewed and adjusted.  Please request your Prim.MD to go over all Hospital Tests and Procedure/Radiological results at the follow up, please get all Hospital records sent to your Prim MD by signing hospital release before you go home.  If you experience worsening of your admission symptoms, develop shortness of breath, life threatening emergency, suicidal or homicidal thoughts you must seek medical attention immediately by calling 911 or calling  your MD immediately  if symptoms less severe.  You Must read complete instructions/literature along with all the possible adverse reactions/side effects for all the Medicines you take and that have been prescribed to you. Take any new Medicines after you have completely understood and accpet all the possible adverse reactions/side effects.   Increase activity slowly   Complete by: As directed        Discharge Medications   Allergies as of 09/24/2020   No Known Allergies      Medication List     TAKE these medications    acetaminophen 500 MG tablet Commonly known as: TYLENOL Take 500-1,000 mg by mouth every 8 (eight) hours as needed for mild pain or headache.   apixaban 5 MG Tabs tablet Commonly known as: Eliquis Take 1 tablet (5 mg total) by mouth 2 (two) times daily.   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   brimonidine 0.2 % ophthalmic solution Commonly known as: ALPHAGAN Place 1 drop into the left eye in the morning and at bedtime. Instill 1 drop into the left eye twice a day- 6:45 AM and 5:50 PM   budesonide 3 MG 24 hr capsule Commonly known as: ENTOCORT EC Take 6 mg by mouth daily as needed (crohn's disease).   CALCIUM 1200 PO Take 1 tablet by mouth daily with breakfast.   cyanocobalamin 1000 MCG/ML injection Commonly known as: (VITAMIN B-12) INJECT 1 ML INTO THE MUSCLE EVERY 30 DAYS What changed:  how much to take how to take this when to take this   dorzolamide-timolol 22.3-6.8 MG/ML ophthalmic solution Commonly known as: COSOPT Place 1 drop into the left eye 2 (two) times daily. Instill 1 drop into the left eye twice a day- 6:35 AM and 5:30 PM   ferrous sulfate 325 (65 FE) MG tablet Take 325 mg by mouth 3 (three) times a week. In the morning   latanoprost 0.005 % ophthalmic solution Commonly known as: XALATAN Place 1 drop into  the left eye at bedtime. Instill 1 drop into the left eye  at 6:10 PM daily   loperamide 2 MG tablet Commonly known as: IMODIUM A-D Take 2 mg by mouth daily.   metoprolol succinate 25 MG 24 hr tablet Commonly known as: Toprol XL Take 0.5 tablets (12.5 mg total) by mouth at bedtime.   Netarsudil Dimesylate 0.02 % Soln Place 1 drop into the left eye at bedtime. Instill 1 drop into the left eye at 6:15 PM daily   omeprazole 20 MG tablet Commonly known as: PRILOSEC OTC Take 20 mg by mouth daily before breakfast.   prednisoLONE acetate 1 % ophthalmic suspension Commonly known as: PRED FORTE Place 1 drop into the right eye in the morning, at noon, in the evening, and at bedtime. Instill 1 drop into the right eye four times a day- 7:00 AM, 12:00 noon, 4:00 PM, and 6:00 PM   SYRINGE 3CC/25GX1" 25G X 1" 3 ML Misc Use for monthly B12 injections   tamsulosin 0.4 MG Caps capsule Commonly known as: FLOMAX Take 0.4 mg by mouth at bedtime.   vitamin C 1000 MG tablet Take 1,000 mg by mouth daily with breakfast.   Vitamin D3 50 MCG (2000 UT) Tabs Take 2,000 Units by mouth daily with breakfast.         Follow-up Information     Binnie Rail, MD. Schedule an appointment as soon as possible for a visit in 1 week(s).   Specialty: Internal Medicine Contact information: [place] [address] [phone]         Burnell Blanks, MD .   Specialty: Cardiology Contact information: [address] [place] [address] [phone]         [facility]. Schedule an appointment as soon as possible for a visit in 1 week(s).   Why: ? TIA Contact information: [address]     [place] [address] [phone]                Major procedures and Radiology Reports - PLEASE review detailed and final reports thoroughly  -        CT HEAD WO CONTRAST  Result Date: 09/23/2020 CLINICAL  DATA:  Focal neuro deficit, > 6 hrs, stroke suspected EXAM: CT HEAD WITHOUT CONTRAST TECHNIQUE: Contiguous axial images were obtained from the base of the skull through the vertex without intravenous contrast. COMPARISON:  09/11/2019 FINDINGS: Brain: Old left thalamic lacunar infarct. There is atrophy and chronic small vessel disease changes. No acute intracranial abnormality. Specifically, no hemorrhage, hydrocephalus, mass lesion, acute infarction, or significant intracranial injury. Vascular: No hyperdense vessel or unexpected calcification. Skull: No acute calvarial abnormality. Sinuses/Orbits: Mucosal thickening in the right maxillary sinus. No air-fluid levels. Michael: None IMPRESSION: Old left thalamic lacunar infarct. Atrophy, chronic microvascular disease. No acute intracranial abnormality. Electronically Signed   By: Rolm Baptise M.D.   On: 09/23/2020 09:18   MR BRAIN WO CONTRAST  Result Date: 09/23/2020 CLINICAL DATA:  Neuro deficit, acute, stroke suspected. Right facial droop, slurred speech, and gait abnormality. EXAM: MRI HEAD WITHOUT CONTRAST TECHNIQUE: Multiplanar, multiecho pulse sequences of the brain and surrounding structures were obtained without intravenous contrast. COMPARISON:  Head CT 09/23/2020 and MRI 09/11/2019 FINDINGS: Brain: There is no evidence of an acute infarct, intracranial hemorrhage, mass, midline shift, or extra-axial fluid collection. T2 hyperintensities in the cerebral white matter bilaterally are unchanged from the prior MRI and are nonspecific but compatible with mild chronic small vessel ischemic disease. Chronic lacunar infarcts are noted  in the left thalamus and left caudate nucleus. There is mild-to-moderate generalized cerebral atrophy. Vascular: Major intracranial vascular flow voids are preserved. Skull and upper cervical spine: Unremarkable bone marrow signal. Sinuses/Orbits: Bilateral cataract extraction. Chronic right maxillary sinusitis with sinus atelectasis.  No significant mastoid fluid. Michael: None. IMPRESSION: 1. No acute intracranial abnormality. 2. Mild chronic small vessel ischemic disease. Electronically Signed   By: Logan Bores M.D.   On: 09/23/2020 12:48   DG Chest Port 1 View  Result Date: 09/23/2020 CLINICAL DATA:  Right side weakness EXAM: PORTABLE CHEST 1 VIEW COMPARISON:  03/10/2019 FINDINGS: The heart size and mediastinal contours are within normal limits. Both lungs are clear. The visualized skeletal structures are unremarkable. IMPRESSION: No active disease. Electronically Signed   By: Rolm Baptise M.D.   On: 09/23/2020 09:26   ECHOCARDIOGRAM COMPLETE  Result Date: 09/23/2020    ECHOCARDIOGRAM REPORT   Patient Name:   Michael [name] Date of Exam: 09/23/2020 Medical Rec #:  [id number]        Height:       71.0 in Accession #:    [id number]       Weight:       180.0 lb Date of Birth:  [birth year]         BSA:          2.016 m Patient Age:    87 years         BP:           146/90 mmHg Patient Gender: M                HR:           62 bpm. Exam Location:  Inpatient Procedure: 2D Echo Indications:    TIA  History:        Patient has no prior history of Echocardiogram examinations.  Sonographer:    Johny Chess RDCS Referring Phys: [place]  1. Left ventricular ejection fraction, by estimation, is 55 to 60%. Left ventricular ejection fraction by PLAX is 57 %. The left ventricle has normal function. The left ventricle demonstrates regional wall motion abnormalities (see scoring diagram/findings for description). Left ventricular diastolic parameters are consistent with Grade I diastolic dysfunction (impaired relaxation).  2. Right ventricular systolic function is normal. The right ventricular size is normal. There is normal pulmonary artery systolic pressure.  3. The mitral valve is normal in structure. Trivial mitral valve regurgitation. No evidence of mitral stenosis.  4. The aortic valve is tricuspid. Aortic valve  regurgitation is trivial. No aortic stenosis is present.  5. Aortic dilatation noted. There is borderline dilatation of the aortic root, measuring 36 mm.  6. The inferior vena cava is normal in size with greater than 50% respiratory variability, suggesting right atrial pressure of 3 mmHg. FINDINGS  Left Ventricle: Left ventricular ejection fraction, by estimation, is 55 to 60%. Left ventricular ejection fraction by PLAX is 57 %. The left ventricle has normal function. The left ventricle demonstrates regional wall motion abnormalities. The left ventricular internal cavity size was normal in size. There is no left ventricular hypertrophy. Left ventricular diastolic parameters are consistent with Grade I diastolic dysfunction (impaired relaxation). Normal left ventricular filling pressure. Right Ventricle: The right ventricular size is normal. No increase in right ventricular wall thickness. Right ventricular systolic function is normal. There is normal pulmonary artery systolic pressure. The tricuspid regurgitant velocity is 2.58 m/s, and  with an assumed right atrial pressure of 3  mmHg, the estimated right ventricular systolic pressure is 96.2 mmHg. Left Atrium: Left atrial size was normal in size. Right Atrium: Right atrial size was normal in size. Pericardium: Trivial pericardial effusion is present. Mitral Valve: The mitral valve is normal in structure. Trivial mitral valve regurgitation. No evidence of mitral valve stenosis. Tricuspid Valve: The tricuspid valve is normal in structure. Tricuspid valve regurgitation is trivial. No evidence of tricuspid stenosis. Aortic Valve: The aortic valve is tricuspid. Aortic valve regurgitation is trivial. No aortic stenosis is present. Pulmonic Valve: The pulmonic valve was normal in structure. Pulmonic valve regurgitation is not visualized. No evidence of pulmonic stenosis. Aorta: The ascending aorta was not well visualized and aortic dilatation noted. There is borderline  dilatation of the aortic root, measuring 36 mm. Venous: The inferior vena cava is normal in size with greater than 50% respiratory variability, suggesting right atrial pressure of 3 mmHg. IAS/Shunts: No atrial level shunt detected by color flow Doppler.  LEFT VENTRICLE PLAX 2D LV EF:         Left            Diastology                ventricular     LV e' medial:    5.44 cm/s                ejection        LV E/e' medial:  8.6                fraction by     LV e' lateral:   8.38 cm/s                PLAX is 57      LV E/e' lateral: 5.6                %. LVIDd:         5.00 cm LVIDs:         3.50 cm LV PW:         1.00 cm LV IVS:        1.00 cm LVOT diam:     2.20 cm LV SV:         72 LV SV Index:   36 LVOT Area:     3.80 cm  RIGHT VENTRICLE             IVC RV S prime:     18.70 cm/s  IVC diam: 1.20 cm TAPSE (M-mode): 1.3 cm LEFT ATRIUM             Index       RIGHT ATRIUM           Index LA diam:        3.70 cm 1.84 cm/m  RA Area:     13.60 cm LA Vol (A2C):   38.3 ml 19.00 ml/m RA Volume:   31.70 ml  15.72 ml/m LA Vol (A4C):   49.3 ml 24.45 ml/m LA Biplane Vol: 44.0 ml 21.82 ml/m  AORTIC VALVE LVOT Vmax:   78.50 cm/s LVOT Vmean:  52.100 cm/s LVOT VTI:    0.190 m  AORTA Ao Root diam: 3.60 cm Ao Asc diam:  3.10 cm MITRAL VALVE               TRICUSPID VALVE MV Area (PHT): 2.48 cm    TR Peak grad:   26.6 mmHg MV Decel Time: 306 msec    TR Vmax:  258.00 cm/s MV E velocity: 46.70 cm/s MV A velocity: 85.90 cm/s  SHUNTS MV E/A ratio:  0.54        Systemic VTI:  0.19 m                            Systemic Diam: 2.20 cm Skeet Latch MD Electronically signed by Skeet Latch MD Signature Date/Time: 09/23/2020/11:25:51 PM    Final    CT ANGIO HEAD NECK W WO CM (CODE STROKE)  Result Date: 09/24/2020 CLINICAL DATA:  Transient ischemic attack, altered mental status EXAM: CT ANGIOGRAPHY HEAD AND NECK TECHNIQUE: Multidetector CT imaging of the head and neck was performed using the standard protocol during bolus  administration of intravenous contrast. Multiplanar CT image reconstructions and MIPs were obtained to evaluate the vascular anatomy. Carotid stenosis measurements (when applicable) are obtained utilizing NASCET criteria, using the distal internal carotid diameter as the denominator. CONTRAST:  175m OMNIPAQUE IOHEXOL 350 MG/ML SOLN COMPARISON:  CT head 09/23/2020 FINDINGS: CT HEAD Brain: There is no acute intracranial hemorrhage, mass effect, or edema. Gray-white differentiation is preserved. There is no extra-axial fluid collection. Ventricles and sulci are stable in size and configuration. Stable findings of chronic microvascular ischemic changes. Small chronic left thalamic infarct again noted. Vascular: No new abnormality. Skull: Calvarium is unremarkable. Sinuses/Orbits: No acute finding. Michael: None. Review of the MIP images confirms the above findings CTA NECK Aortic arch: Great vessel origins are patent. Right carotid system: Patent. Trace calcified plaque the bifurcation. No stenosis. Left carotid system: Patent.  No stenosis. Vertebral arteries: Patent.  Codominant.  No stenosis. Skeleton: Degenerative changes of the included spine. Degenerative changes of the right temporomandibular joint. Michael neck: No mass or adenopathy. Upper chest: No apical lung mass. Review of the MIP images confirms the above findings CTA HEAD Anterior circulation: Intracranial internal carotid arteries are patent with minimal calcified plaque. Anterior and middle cerebral arteries are patent. Posterior circulation: Intracranial vertebral arteries are patent. Basilar artery is patent. Major cerebellar artery origins are patent. Posterior communicating artery is present. Posterior cerebral arteries are patent. Venous sinuses: Patent as allowed by contrast bolus timing. Review of the MIP images confirms the above findings IMPRESSION: No acute intracranial abnormality. No large vessel occlusion, hemodynamically significant stenosis,  or evidence of dissection. Electronically Signed   By: PMacy MisM.D.   On: 09/24/2020 10:08      Today   Subjective    Michael [name] has no headache,no chest abdominal pain,no new weakness tingling or numbness, feels much better wants to go home today.     Objective   Blood pressure 165/85, pulse 65, temperature (!) 97.4 F (36.3 C), temperature source Oral, resp. rate 13, height 5' 11"  (1.803 m), weight 81.6 kg, SpO2 98 %.   Intake/Output Summary (Last 24 hours) at 09/24/2020 1040 Last data filed at 09/23/2020 2304 Gross per 24 hour  Intake --  Output 150 ml  Net -150 ml    Exam  Awake Alert, No new F.N deficits, Normal affect Kraemer.AT,PERRAL Supple Neck,No JVD, No cervical lymphadenopathy appriciated.  Symmetrical Chest wall movement, Good air movement bilaterally, CTAB RRR,No Gallops,Rubs or new Murmurs, No Parasternal Heave +ve B.Sounds, Abd Soft, Non tender, No organomegaly appriciated, No rebound -guarding or rigidity. No Cyanosis, Clubbing or edema, No new Rash or bruise   Data Review   CBC w Diff:  Lab Results  Component Value Date   WBC 7.2 09/23/2020  HGB 11.2 (L) 09/23/2020   HCT 33.0 (L) 09/23/2020   PLT 158 09/23/2020   LYMPHOPCT 27 09/23/2020   MONOPCT 9 09/23/2020   EOSPCT 1 09/23/2020   BASOPCT 1 09/23/2020    CMP:  Lab Results  Component Value Date   NA 138 09/23/2020   NA 138 10/04/2016   K 3.8 09/23/2020   CL 102 09/23/2020   CO2 27 09/23/2020   BUN 17 09/23/2020   BUN 13 10/04/2016   CREATININE 0.90 09/23/2020   GLU 87 10/04/2016   PROT 5.9 (L) 09/23/2020   ALBUMIN 3.1 (L) 09/23/2020   BILITOT 1.0 09/23/2020   ALKPHOS 36 (L) 09/23/2020   AST 15 09/23/2020   ALT 13 09/23/2020  . Lab Results  Component Value Date   CHOL 110 09/24/2020   HDL 42 09/24/2020   LDLCALC 50 09/24/2020   LDLDIRECT 60.6 09/11/2019   TRIG 92 09/24/2020   CHOLHDL 2.6 09/24/2020   Lab Results  Component Value Date   HGBA1C 5.1 09/24/2020      Total Time in preparing paper work, data evaluation and todays exam - 70 minutes  Lala Lund M.D on 09/24/2020 at 10:40 AM  [facility]

## 2020-09-24 NOTE — ED Notes (Signed)
Patient transported to CT 

## 2020-09-24 NOTE — Progress Notes (Signed)
Neurology Progress Note  Subjective: No acute overnight events Patient states that he is feeling much better this morning and he is able to ambulate with a steady gait and without the use of assistive device. He no longer complains of dizziness or unsteadiness with gait.   Exam: Vitals:   09/24/20 0753 09/24/20 1056  BP:  (!) 193/97  Pulse:  74  Resp:  17  Temp: (!) 97.4 F (36.3 C)   SpO2:  96%   Physical Exam  Constitutional: Appears well-developed and well-nourished.  Psych: Affect appropriate to situation Eyes: No scleral injection HENT: No OP obstruction. Head: Normocephalic and atraumatic  Cardiovascular: Irregular rate and rhythm Respiratory: Effort normal, symmetric excursions bilaterally, no audible wheezing. GI: Soft.  No distension. There is no tenderness.  Skin: WDI   Neuro: Mental Status: Patient is awake, alert, oriented to person, place, month, year, and situation. Patient is able to give a clear and coherent history. Speech  fluent, intact comprehension and repetition. No signs of aphasia or neglect. Visual Fields are full. Pupils are equal, round, and reactive to light. EOMI without ptosis or diploplia.  Facial sensation is symmetric to temperature Facial movement is mildly asymmetric on right.  Hearing is intact to voice. Uvula midline and palate elevates symmetrically. Shoulder shrug is symmetric. Tongue is midline without atrophy or fasciculations.  Tone is normal. Bulk is normal. 5/5 strength was present in all four extremitates Sensation is symmetric to light touch and temperature in the arms and legs. Patient is stable with standing and with ambulation without further complaints of dizziness or gait instability.  Romberg (-) Gait - Steady without use of assistive device.   Pertinent Labs: CBC    Component Value Date/Time   WBC 7.2 09/23/2020 0849   RBC 3.57 (L) 09/23/2020 0849   HGB 11.2 (L) 09/23/2020 0855   HCT 33.0 (L) 09/23/2020 0855    PLT 158 09/23/2020 0849   MCV 95.5 09/23/2020 0849   MCH 32.2 09/23/2020 0849   MCHC 33.7 09/23/2020 0849   RDW 14.3 09/23/2020 0849   LYMPHSABS 1.9 09/23/2020 0849   MONOABS 0.6 09/23/2020 0849   EOSABS 0.1 09/23/2020 0849   BASOSABS 0.0 09/23/2020 0849   CMP     Component Value Date/Time   NA 138 09/23/2020 0855   NA 138 10/04/2016 0000   K 3.8 09/23/2020 0855   CL 102 09/23/2020 0855   CO2 27 09/23/2020 0849   GLUCOSE 90 09/23/2020 0855   BUN 17 09/23/2020 0855   BUN 13 10/04/2016 0000   CREATININE 0.90 09/23/2020 0855   CALCIUM 8.5 (L) 09/23/2020 0849   PROT 5.9 (L) 09/23/2020 0849   ALBUMIN 3.1 (L) 09/23/2020 0849   AST 15 09/23/2020 0849   ALT 13 09/23/2020 0849   ALKPHOS 36 (L) 09/23/2020 0849   BILITOT 1.0 09/23/2020 0849   GFRNONAA >60 09/23/2020 0849   GFRAA >60 09/11/2019 1315   Imaging Reviewed: Attending MD has reviewed the images obtained: NCT head showed no acute ischemic changes, hemorrhage, mass. Old left thalamic lacunar infarct. MRI brain did not show acute intracranial abnormality.  Assessment: Michael [name] is a 87 y.o. male PMHx atrial fibrillation on apixaban, chronic left thalamic stroke with symptoms of TIA now resolved. He does not have significant vascular risk factors and his atrial fibrillation is treated. On exam, he was unstable on standing which improved after prolonged standing. Romberg was negative and there was not nystagmus or other focal neurologic signs. His MRI was  negative for acute process and he is his complaint seems to be triggered by movement. Differential diagnosis includes presyncope, with high suspicion for acute vestibular syndrome, BPPV, vestibular neuronitis.   Recommendations: - No further inpatient neurology recommendations at this time - Follow up with outpatient neurology if persistent or recurring symptoms - Can consider vestibular rehab if persistent or recurring symptoms with or without low dose clonazepam -  Please contact neurology with further questions or concerns  Anibal Henderson, AGACNP-BC [facility] [phone]

## 2020-09-24 NOTE — ED Notes (Signed)
Patient up walking through the hallways, alert and oriented x4, skin warm and dry to touch, gait steady no respiratory distress noted, denies any pain. MD made aware.

## 2020-09-24 NOTE — Evaluation (Signed)
Physical Therapy Evaluation Patient Details Name: Michael [name] MRN: [id number] DOB: [birth year] Today's Date: 09/24/2020  History of Present Illness  87 yo M with chief complaints of difficulty ambulating and right-sided facial droop.  PMH includes: Anemia, Atrial fibrillation, Avascular necrosis, Clavicle fracture, Colon polyp, Crohn's disease, Glaucoma, Inguinal hernia recurrent bilateral, Low testosterone, Osteopenia, Retinal ischemia.  LTJ:QZESP is no evidence of an acute infarct, intracranial  hemorrhage, mass, midline shift, or extra-axial fluid collection.  Clinical Impression  Pt almost back to baseline with mobility and balance. Mobilizing independently. Expect pt will continue return to baseline and not further PT recommended at this time.        Recommendations for follow up therapy are one component of a multi-disciplinary discharge planning process, led by the attending physician.  Recommendations may be updated based on patient status, additional functional criteria and insurance authorization.  Follow Up Recommendations No PT follow up (discussed with patient that if he isn't back to normal and not back to playing tennis in a couple of weeks to follow up with primary care MD)    Equipment Recommendations  None recommended by PT    Recommendations for Other Services       Precautions / Restrictions Precautions Precautions: None      Mobility  Bed Mobility               General bed mobility comments: Pt sitting up on edge of stretcher    Transfers Overall transfer level: Independent Equipment used: None Transfers: Sit to/from Stand Sit to Stand: Independent         General transfer comment: steady to rise  Ambulation/Gait Ambulation/Gait assistance: Modified independent (Device/Increase time) Gait Distance (Feet): 600 Feet Assistive device: None Gait Pattern/deviations: Decreased stride length Gait velocity: good for age Gait velocity  interpretation: >2.62 ft/sec, indicative of community ambulatory General Gait Details: Pt with steady gait when not challenged. No instability with turns or changes in speed. Slight drifting with head turns but no loss of balance.  Stairs            Wheelchair Mobility    Modified Rankin (Stroke Patients Only) Modified Rankin (Stroke Patients Only) Pre-Morbid Rankin Score: No symptoms Modified Rankin: No significant disability     Balance Overall balance assessment: Independent                           High level balance activites: Direction changes;Turns;Head turns;Sudden stops High Level Balance Comments: Steady with all above except slight drift with head turns. No loss of balance.             Pertinent Vitals/Pain Pain Assessment: No/denies pain    Home Living Family/patient expects to be discharged to:: Private residence Living Arrangements: Spouse/significant other Available Help at Discharge: Family;Available 24 hours/day Type of Home: Other(Comment) (condo) Home Access: Level entry     Home Layout: One level Home Equipment: Cane - single point;Shower seat;Grab bars - tub/shower;Hand held shower head      Prior Function Level of Independence: Independent         Comments: Continues to be very active, plays tennis.  No assist with ADL, and walks without an AD.     Hand Dominance   Dominant Hand: Right    Extremity/Trunk Assessment   Upper Extremity Assessment Upper Extremity Assessment: Defer to OT evaluation    Lower Extremity Assessment Lower Extremity Assessment: Overall WFL for tasks assessed  Communication   Communication: HOH  Cognition Arousal/Alertness: Awake/alert Behavior During Therapy: WFL for tasks assessed/performed Overall Cognitive Status: No family/caregiver present to determine baseline cognitive functioning Area of Impairment: Memory                     Memory: Decreased short-term memory                 General Comments      Exercises     Assessment/Plan    PT Assessment Patent does not need any further PT services  PT Problem List         PT Treatment Interventions      PT Goals (Current goals can be found in the Care Plan section)  Acute Rehab PT Goals Patient Stated Goal: go home and resume tennis PT Goal Formulation: All assessment and education complete, DC therapy    Frequency     Barriers to discharge        Co-evaluation               AM-PAC PT "6 Clicks" Mobility  Outcome Measure Help needed turning from your back to your side while in a flat bed without using bedrails?: None Help needed moving from lying on your back to sitting on the side of a flat bed without using bedrails?: None Help needed moving to and from a bed to a chair (including a wheelchair)?: None Help needed standing up from a chair using your arms (e.g., wheelchair or bedside chair)?: None Help needed to walk in hospital room?: None Help needed climbing 3-5 steps with a railing? : None 6 Click Score: 24    End of Session   Activity Tolerance: Patient tolerated treatment well Patient left: in bed;with call bell/phone within reach Nurse Communication: Mobility status PT Visit Diagnosis: Other abnormalities of gait and mobility (R26.89)    Time: 8264-1583 PT Time Calculation (min) (ACUTE ONLY): 16 min   Charges:   PT Evaluation $PT Eval Low Complexity: [place] Pager [phone] Office [place] 09/24/2020, 10:43 AM

## 2020-09-24 NOTE — Discharge Instructions (Signed)
Follow with Primary MD Binnie Rail, MD in 7 days   Get CBC, CMP, 2 view Chest X ray -  checked next visit within 1 week by Primary MD    Activity: As tolerated with Full fall precautions use walker/cane & assistance as needed  Disposition Home    Diet: Heart Healthy   Special Instructions: If you have smoked or chewed Tobacco  in the last 2 yrs please stop smoking, stop any regular Alcohol  and or any Recreational drug use.  On your next visit with your primary care physician please Get Medicines reviewed and adjusted.  Please request your Prim.MD to go over all Hospital Tests and Procedure/Radiological results at the follow up, please get all Hospital records sent to your Prim MD by signing hospital release before you go home.  If you experience worsening of your admission symptoms, develop shortness of breath, life threatening emergency, suicidal or homicidal thoughts you must seek medical attention immediately by calling 911 or calling your MD immediately  if symptoms less severe.  You Must read complete instructions/literature along with all the possible adverse reactions/side effects for all the Medicines you take and that have been prescribed to you. Take any new Medicines after you have completely understood and accpet all the possible adverse reactions/side effects.

## 2020-09-24 NOTE — ED Notes (Signed)
Tele  Breakfast Ordered 

## 2020-09-24 NOTE — ED Notes (Signed)
Patient returned from CT

## 2020-09-24 NOTE — ED Notes (Signed)
Patient up walking with PT

## 2020-09-24 NOTE — ED Notes (Signed)
Pt verbalized understanding of d/c instructions, meds and followup care. Denies questions. VSS, no distress noted. Steady gait to exit with wife.

## 2020-09-28 NOTE — Progress Notes (Signed)
Subjective:    Patient ID: Michael [name], male    DOB: 1932/08/31, 87 y.o.   MRN: [id number]  This visit occurred during the SARS-CoV-2 public health emergency.  Safety protocols were in place, including screening questions prior to the visit, additional usage of staff PPE, and extensive cleaning of exam room while observing appropriate contact time as indicated for disinfecting solutions.     HPI The patient is here for follow up from the hospital.  He is here with his wife.  Admitted 9/14 - 9/16 .  Went to ED for Difficulty ambulating and mild right sided facial droop noted by his wife in the morning. He could not walk straight.  Last seen normal 9pm night prior.  He had trouble walking to bathroom at 2 am and was feeling off, but did not wake his wife.  Denied weakness.  No head trauma, headaches or neck pain.    On exam in ED found to have mild RUE and RLE weakness.  He received ativan and meclizine.  Ct of head and MRI negative.  TTE negative.  He was unable to ambulate w/o significant assistance.  By morning of 9/15 he did not have dizziness or unsteadiness.  No evidence of stroke on imaging-- DDx per neurology- presyncope, high suspicion for acute vestibular syndrome, BPPV, vestibular neuronitis.  Discharged home with no symptoms.    No change in home medications.  He is taking his medications as prescribed.  He does state he typically takes Eliquis at 7:00 in the morning and 9:00 in the evening.    Medications and allergies reviewed with patient and updated if appropriate.  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      apixaban (ELIQUIS) 5 MG TABS tablet Take 1 tablet (5 mg total) by mouth 2 (two) times daily. 28 tablet 0   Ascorbic Acid (VITAMIN C) 1000 MG tablet Take 1,000 mg by mouth daily with breakfast.      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      brimonidine (ALPHAGAN) 0.2 % ophthalmic solution Place 1 drop into the left eye in the morning and at bedtime. Instill 1 drop into the left eye twice a day- 6:45 AM and 5:50 PM     budesonide (ENTOCORT EC) 3 MG 24 hr capsule Take 6 mg by mouth  daily as needed (crohn's disease).     Cholecalciferol (VITAMIN D3) 50 MCG (2000 UT) TABS Take 2,000 Units by mouth daily with breakfast.     cyanocobalamin (,VITAMIN B-12,) 1000 MCG/ML injection INJECT 1 ML INTO THE MUSCLE EVERY 30 DAYS (Patient taking differently: Inject 1,000 mcg into the muscle every 30 (thirty) days. INJECT 1 ML INTO THE MUSCLE EVERY 30 DAYS) 10 mL 1   dorzolamide-timolol (COSOPT) 22.3-6.8 MG/ML ophthalmic solution Place 1 drop into the left eye 2 (two) times daily. Instill 1 drop into the left eye twice a day- 6:35 AM and 5:30 PM     ferrous sulfate 325 (65 FE) MG tablet Take 325 mg by mouth 3 (three)  times a week. In the morning     latanoprost (XALATAN) 0.005 % ophthalmic solution Place 1 drop into the left eye at bedtime. Instill 1 drop into the left eye at 6:10 PM daily     loperamide (IMODIUM A-D) 2 MG tablet Take 2 mg by mouth daily.     metoprolol succinate (TOPROL XL) 25 MG 24 hr tablet Take 0.5 tablets (12.5 mg total) by mouth at bedtime. 30 tablet 3   Netarsudil Dimesylate 0.02 % SOLN Place 1 drop into the left eye at bedtime. Instill 1 drop into the left eye at 6:15 PM daily     omeprazole (PRILOSEC OTC) 20 MG tablet Take 20 mg by mouth daily before breakfast.     prednisoLONE acetate (PRED FORTE) 1 % ophthalmic suspension Place 1 drop into the right eye in the morning, at noon, in the evening, and at bedtime. Instill 1 drop into the right eye four times a day- 7:00 AM, 12:00 noon, 4:00 PM, and 6:00 PM     Syringe/Needle, Disp, (SYRINGE 3CC/25GX1") 25G X 1" 3 ML MISC Use for monthly B12 injections 12 each 0   tamsulosin (FLOMAX) 0.4 MG CAPS capsule Take 0.4 mg by mouth at bedtime.     No current facility-administered medications on file prior to visit.    Past Medical History:  Diagnosis Date   Adrenal insufficiency (HCC)    Anemia    Atrial fibrillation (HCC)    Avascular necrosis (HCC)    Clavicle fracture 11/02/2017   Colon polyp    Colon polyps    Crohn's disease ([place])    Glaucoma    Inguinal hernia recurrent bilateral    Low testosterone    Osteopenia    Retinal ischemia     Past Surgical History:  Procedure Laterality Date   ABDOMINAL SURGERY     BIOPSY  03/19/2020   Procedure: BIOPSY;  Surgeon: Yetta Flock, MD;  Location: [facility];  Service: Gastroenterology;;   COLONOSCOPY N/A 12/13/2015   Procedure: COLONOSCOPY;  Surgeon: Laurence Spates, MD;  Location: [facility];  Service: Endoscopy;  Laterality: N/A;   COLONOSCOPY WITH PROPOFOL N/A 03/25/2019   Procedure: COLONOSCOPY WITH PROPOFOL;  Surgeon: Yetta Flock, MD;  Location: [facility];   Service: Gastroenterology;  Laterality: N/A;   COLONOSCOPY WITH PROPOFOL N/A 08/26/2019   Procedure: COLONOSCOPY WITH PROPOFOL;  Surgeon: Yetta Flock, MD;  Location: [facility];  Service: Gastroenterology;  Laterality: N/A;   COLONOSCOPY WITH PROPOFOL N/A 03/19/2020   Procedure: COLONOSCOPY WITH PROPOFOL;  Surgeon: Yetta Flock, MD;  Location: [facility];  Service: Gastroenterology;  Laterality: N/A;   ENDOSCOPIC MUCOSAL RESECTION N/A 08/26/2019   Procedure: ENDOSCOPIC MUCOSAL RESECTION;  Surgeon: Yetta Flock, MD;  Location: [facility];  Service: Gastroenterology;  Laterality:  N/A;   INGUINAL HERNIA REPAIR Bilateral 05/21/2014   Procedure: OPEN BILATERAL INGUINAL HERNIA REPAIRS WITH MESH;  Surgeon: Donnie Mesa, MD;  Location: [facility];  Service: General;  Laterality: Bilateral;   POLYPECTOMY  03/25/2019   Procedure: POLYPECTOMY;  Surgeon: Yetta Flock, MD;  Location: [facility];  Service: Gastroenterology;;   POLYPECTOMY  08/26/2019   Procedure: POLYPECTOMY;  Surgeon: Yetta Flock, MD;  Location: [facility];  Service: Gastroenterology;;   POLYPECTOMY  03/19/2020   Procedure: POLYPECTOMY;  Surgeon: Yetta Flock, MD;  Location: [facility];  Service: Gastroenterology;;   [place] Ku  08/26/2019   Procedure: Sprayed methylene blue;  Surgeon: Yetta Flock, MD;  Location: [facility];  Service: Gastroenterology;;   SMALL INTESTINE SURGERY  1968, 2001    related to Chrohn's disease   TONSILLECTOMY      Social History   Socioeconomic History   Marital status: Married    Spouse name: Not on file   Number of children: 3   Years of education: Not on file   Highest education level: Not on file  Occupational History   Occupation: Retired  Tobacco Use   Smoking status: Former    Types: Cigarettes    Quit date: 04/01/1976    Years since quitting: 44.5   Smokeless tobacco: Never  Vaping Use   Vaping Use: Never  used  Substance and Sexual Activity   Alcohol use: Yes    Alcohol/week: 2.0 standard drinks    Types: 2 Glasses of wine per week    Comment: 3-4  glass wine  weekly   Drug use: No   Sexual activity: Never  Other Topics Concern   Not on file  Social History Narrative   Not on file   Social Determinants of Health   Financial Resource Strain: Not on file  Food Insecurity: Not on file  Transportation Needs: Not on file  Physical Activity: Not on file  Stress: Not on file  Social Connections: Not on file    Family History  Problem Relation Age of Onset   Deep vein thrombosis Mother    Heart disease Father    Cancer Maternal Aunt        unknown type; dx after 41   Cancer Cousin        maternal cousin; unknown type   Cancer Cousin        paternal cousin; unknown type; dx after 54   Stomach cancer Neg Hx    Colon cancer Neg Hx    Esophageal cancer Neg Hx    Pancreatic cancer Neg Hx    Rectal cancer Neg Hx    Colon polyps Neg Hx     Review of Systems  Constitutional:  Negative for fever.  Respiratory:  Negative for cough, shortness of breath and wheezing.   Cardiovascular:  Negative for chest pain, palpitations and leg swelling.  Gastrointestinal:  Negative for abdominal pain, blood in stool and nausea.       No gerd  Neurological:  Negative for dizziness, weakness, light-headedness, numbness and headaches.      Objective:   Vitals:   09/29/20 0918  BP: (!) 146/78  Pulse: 85  Temp: 98.2 F (36.8 C)  SpO2: 96%   BP Readings from Last 3 Encounters:  09/29/20 (!) 146/78  09/24/20 (!) 193/97  05/01/20 (!) 172/81   Wt Readings from Last 3 Encounters:  09/29/20 181 lb (82.1 kg)  09/23/20 180 lb (81.6 kg)  04/23/20 183 lb (  83 kg)   Body mass index is 25.24 kg/m.   Physical Exam    Constitutional: Appears well-developed and well-nourished. No distress.  HENT:  Head: Normocephalic and atraumatic.  Neck: Neck supple. No tracheal deviation present. No  thyromegaly present.  No cervical lymphadenopathy Cardiovascular: Normal rate, regular rhythm and normal heart sounds.   No murmur heard. No carotid bruit .  No edema Pulmonary/Chest: Effort normal and breath sounds normal. No respiratory distress. No has no wheezes. No rales. Neurological: No facial droop.  Strength and sensation normal and equal bilateral upper and lower extremities Skin: Skin is warm and dry. Not diaphoretic.  Psychiatric: Normal mood and affect. Behavior is normal.   Lab Results  Component Value Date   WBC 7.2 09/23/2020   HGB 11.2 (L) 09/23/2020   HCT 33.0 (L) 09/23/2020   PLT 158 09/23/2020   GLUCOSE 90 09/23/2020   CHOL 110 09/24/2020   TRIG 92 09/24/2020   HDL 42 09/24/2020   LDLDIRECT 60.6 09/11/2019   LDLCALC 50 09/24/2020   ALT 13 09/23/2020   AST 15 09/23/2020   NA 138 09/23/2020   K 3.8 09/23/2020   CL 102 09/23/2020   CREATININE 0.90 09/23/2020   BUN 17 09/23/2020   CO2 27 09/23/2020   TSH 2.11 12/24/2019   INR 1.2 09/23/2020   HGBA1C 5.1 09/24/2020    CT ANGIO HEAD NECK W WO CM (CODE STROKE) CLINICAL DATA:  Transient ischemic attack, altered mental status  EXAM: CT ANGIOGRAPHY HEAD AND NECK  TECHNIQUE: Multidetector CT imaging of the head and neck was performed using the standard protocol during bolus administration of intravenous contrast. Multiplanar CT image reconstructions and MIPs were obtained to evaluate the vascular anatomy. Carotid stenosis measurements (when applicable) are obtained utilizing NASCET criteria, using the distal internal carotid diameter as the denominator.  CONTRAST:  127m OMNIPAQUE IOHEXOL 350 MG/ML SOLN  COMPARISON:  CT head 09/23/2020  FINDINGS: CT HEAD  Brain: There is no acute intracranial hemorrhage, mass effect, or edema. Gray-white differentiation is preserved. There is no extra-axial fluid collection. Ventricles and sulci are stable in size and configuration. Stable findings of chronic  microvascular ischemic changes. Small chronic left thalamic infarct again noted.  Vascular: No new abnormality.  Skull: Calvarium is unremarkable.  Sinuses/Orbits: No acute finding.  Other: None.  Review of the MIP images confirms the above findings  CTA NECK  Aortic arch: Great vessel origins are patent.  Right carotid system: Patent. Trace calcified plaque the bifurcation. No stenosis.  Left carotid system: Patent.  No stenosis.  Vertebral arteries: Patent.  Codominant.  No stenosis.  Skeleton: Degenerative changes of the included spine. Degenerative changes of the right temporomandibular joint.  Other neck: No mass or adenopathy.  Upper chest: No apical lung mass.  Review of the MIP images confirms the above findings  CTA HEAD  Anterior circulation: Intracranial internal carotid arteries are patent with minimal calcified plaque. Anterior and middle cerebral arteries are patent.  Posterior circulation: Intracranial vertebral arteries are patent. Basilar artery is patent. Major cerebellar artery origins are patent. Posterior communicating artery is present. Posterior cerebral arteries are patent.  Venous sinuses: Patent as allowed by contrast bolus timing.  Review of the MIP images confirms the above findings  IMPRESSION: No acute intracranial abnormality.  No large vessel occlusion, hemodynamically significant stenosis, or evidence of dissection.  Electronically Signed   By: PMacy MisM.D.   On: 09/24/2020 10:08    Assessment & Plan:    See  Problem List for Assessment and Plan of chronic medical problems.

## 2020-09-29 ENCOUNTER — Other Ambulatory Visit: Payer: Self-pay

## 2020-09-29 ENCOUNTER — Ambulatory Visit (INDEPENDENT_AMBULATORY_CARE_PROVIDER_SITE_OTHER): Payer: Medicare Other | Admitting: Internal Medicine

## 2020-09-29 ENCOUNTER — Encounter: Payer: Self-pay | Admitting: Internal Medicine

## 2020-09-29 VITALS — BP 146/78 | HR 85 | Temp 98.2°F | Ht 71.0 in | Wt 181.0 lb

## 2020-09-29 DIAGNOSIS — G459 Transient cerebral ischemic attack, unspecified: Secondary | ICD-10-CM

## 2020-09-29 DIAGNOSIS — D509 Iron deficiency anemia, unspecified: Secondary | ICD-10-CM | POA: Diagnosis not present

## 2020-09-29 LAB — COMPREHENSIVE METABOLIC PANEL
ALT: 10 U/L (ref 0–53)
AST: 12 U/L (ref 0–37)
Albumin: 4 g/dL (ref 3.5–5.2)
Alkaline Phosphatase: 47 U/L (ref 39–117)
BUN: 13 mg/dL (ref 6–23)
CO2: 29 mEq/L (ref 19–32)
Calcium: 9.1 mg/dL (ref 8.4–10.5)
Chloride: 100 mEq/L (ref 96–112)
Creatinine, Ser: 0.88 mg/dL (ref 0.40–1.50)
GFR: 77.75 mL/min (ref >60.00)
Glucose, Bld: 82 mg/dL (ref 70–99)
Potassium: 3.7 mEq/L (ref 3.5–5.1)
Sodium: 136 mEq/L (ref 135–145)
Total Bilirubin: 1.2 mg/dL (ref 0.2–1.2)
Total Protein: 7.2 g/dL (ref 6.0–8.3)

## 2020-09-29 LAB — CBC WITH DIFFERENTIAL/PLATELET
Basophils Absolute: 0.1 10*3/uL (ref 0.0–0.1)
Basophils Relative: 0.7 % (ref 0.0–3.0)
Eosinophils Absolute: 0.1 10*3/uL (ref 0.0–0.7)
Eosinophils Relative: 0.9 % (ref 0.0–5.0)
HCT: 36.4 % — ABNORMAL LOW (ref 39.0–52.0)
Hemoglobin: 12.5 g/dL — ABNORMAL LOW (ref 13.0–17.0)
Lymphocytes Relative: 22.4 % (ref 12.0–46.0)
Lymphs Abs: 1.6 10*3/uL (ref 0.7–4.0)
MCHC: 34.3 g/dL (ref 30.0–36.0)
MCV: 93.3 fl (ref 78.0–100.0)
Monocytes Absolute: 0.6 10*3/uL (ref 0.1–1.0)
Monocytes Relative: 8.2 % (ref 3.0–12.0)
Neutro Abs: 4.9 10*3/uL (ref 1.4–7.7)
Neutrophils Relative %: 67.8 % (ref 43.0–77.0)
Platelets: 186 10*3/uL (ref 150.0–400.0)
RBC: 3.9 Mil/uL — ABNORMAL LOW (ref 4.22–5.81)
RDW: 15.2 % (ref 11.5–15.5)
WBC: 7.3 10*3/uL (ref 4.0–10.5)

## 2020-09-29 MED ORDER — CALCIUM CARBONATE 600 MG PO TABS: 600.0000 mg | ORAL_TABLET | Freq: Every day | ORAL | Status: DC

## 2020-09-29 NOTE — Patient Instructions (Addendum)
  Blood work was ordered.      Medications changes include :   none.  Make sure you take your eliquis 12 hours apart.

## 2020-09-29 NOTE — Assessment & Plan Note (Addendum)
Chronic Iron def likely related to crohn's diz Will check cbc, CMP, iron panel - he is not sure if he is taking the iron daily or not

## 2020-09-29 NOTE — Assessment & Plan Note (Signed)
Acute I think his symptoms most likely represent a TIA He is taking eliquis 7 am, 9 pm - advised to try to maintain an very 12 hr dosing Not on a statin, but LDL at goal Blood pressure slightly elevated here today-we will monitor and adjust medication if not ideally controlled To see Dr. Lavell Anchors in 2 days Continue regular exercise and activities as tolerated

## 2020-09-30 LAB — IRON,TIBC AND FERRITIN PANEL
%SAT: 31 % (calc) (ref 20–48)
Ferritin: 101 ng/mL (ref 24–380)
Iron: 87 ug/dL (ref 50–180)
TIBC: 285 mcg/dL (calc) (ref 250–425)

## 2020-10-01 ENCOUNTER — Other Ambulatory Visit: Payer: Self-pay

## 2020-10-01 ENCOUNTER — Ambulatory Visit: Payer: Medicare Other | Admitting: Neurology

## 2020-10-01 ENCOUNTER — Encounter: Payer: Self-pay | Admitting: Neurology

## 2020-10-01 VITALS — BP 171/82 | HR 63 | Ht 71.0 in | Wt 180.6 lb

## 2020-10-01 DIAGNOSIS — G459 Transient cerebral ischemic attack, unspecified: Secondary | ICD-10-CM

## 2020-10-01 NOTE — Patient Instructions (Signed)
TIA vs inner ear labyrinthitis or BPPV.   Benign Positional Vertigo(BPPV) Vertigo is the feeling that you or your surroundings are moving when they are not. Benign positional vertigo is the most common form of vertigo. This is usually a harmless condition (benign). This condition is positional. This means that symptoms are triggered by certain movements and positions. This condition can be dangerous if it occurs while you are doing something that could cause harm to yourself or others. This includes activities such as driving or operating machinery. What are the causes? The inner ear has fluid-filled canals that help your brain sense movement and balance. When the fluid moves, the brain receives messages about your body's position. With benign positional vertigo, calcium crystals in the inner ear break free and disturb the inner ear area. This causes your brain to receive confusing messages about your body's position. What increases the risk? You are more likely to develop this condition if: You are a woman. You are 23 years of age or older. You have recently had a head injury. You have an inner ear disease. What are the signs or symptoms? Symptoms of this condition usually happen when you move your head or your eyes in different directions. Symptoms may start suddenly and usually last for less than a minute. They include: Loss of balance and falling. Feeling like you are spinning or moving. Feeling like your surroundings are spinning or moving. Nausea and vomiting. Blurred vision. Dizziness. Involuntary eye movement (nystagmus). Symptoms can be mild and cause only minor problems, or they can be severe and interfere with daily life. Episodes of benign positional vertigo may return (recur) over time. Symptoms may also improve over time. How is this diagnosed? This condition may be diagnosed based on: Your medical history. A physical exam of the head, neck, and ears. Positional tests to  check for or stimulate vertigo. You may be asked to turn your head and change positions, such as going from sitting to lying down. A health care provider will watch for symptoms of vertigo. You may be referred to a health care provider who specializes in ear, nose, and throat problems (ENT or otolaryngologist) or a provider who specializes in disorders of the nervous system (neurologist). How is this treated? This condition may be treated in a session in which your health care provider moves your head in specific positions to help the displaced crystals in your inner ear move. Treatment for this condition may take several sessions. Surgery may be needed in severe cases, but this is rare. In some cases, benign positional vertigo may resolve on its own in 2-4 weeks. Follow these instructions at home: Safety Move slowly. Avoid sudden body or head movements or certain positions, as told by your health care provider. Avoid driving or operating machinery until your health care provider says it is safe. Avoid doing any tasks that would be dangerous to you or others if vertigo occurs. If you have trouble walking or keeping your balance, try using a cane for stability. If you feel dizzy or unstable, sit down right away. Return to your normal activities as told by your health care provider. Ask your health care provider what activities are safe for you. General instructions Take over-the-counter and prescription medicines only as told by your health care provider. Drink enough fluid to keep your urine pale yellow. Keep all follow-up visits. This is important. Contact a health care provider if: You have a fever. Your condition gets worse or you develop new symptoms.  Your family or friends notice any behavioral changes. You have nausea or vomiting that gets worse. You have numbness or a prickling and tingling sensation. Get help right away if you: Have difficulty speaking or moving. Are always dizzy or  faint. Develop severe headaches. Have weakness in your legs or arms. Have changes in your hearing or vision. Develop a stiff neck. Develop sensitivity to light. These symptoms may represent a serious problem that is an emergency. Do not wait to see if the symptoms will go away. Get medical help right away. Call your local emergency services (911 in the [place] Do not drive yourself to the hospital. Summary Vertigo is the feeling that you or your surroundings are moving when they are not. Benign positional vertigo is the most common form of vertigo. This condition is caused by calcium crystals in the inner ear that become displaced. This causes a disturbance in an area of the inner ear that helps your brain sense movement and balance. Symptoms include loss of balance and falling, feeling that you or your surroundings are moving, nausea and vomiting, and blurred vision. This condition can be diagnosed based on symptoms, a physical exam, and positional tests. Follow safety instructions as told by your health care provider and keep all follow-up visits. This is important. This information is not intended to replace advice given to you by your health care provider. Make sure you discuss any questions you have with your health care provider. Document Revised: 11/27/2019 Document Reviewed: 11/27/2019 Elsevier Patient Education  2022 [employer].   Labyrinthitis Labyrinthitis is an inner ear inflammation. The inner ear is a system of tubes and canals (labyrinth) that are filled with fluid. The inner ear also contains nerve cells that send hearing and balance signals to the brain. When tiny germs (microorganisms) get inside the labyrinth, they harm the cells that send messages to the brain. This can cause changes in hearing and balance. Labyrinthitis usually develops suddenly and goes away with treatment in a few weeks (acute labyrinthitis). If the infection damages parts of the labyrinth, some symptoms  may last for a long time (chronic labyrinthitis). What are the causes? Labyrinthitis can be caused by viruses, such as one that causes: Infectious mononucleosis, also called mono. Measles or mumps. The flu (influenza). Herpes. Labyrinthitis can also be caused by bacteria that spread from an infection in the brain or the middle ear (suppurative labyrinthitis). In some cases, the bacteria may produce a poison (toxin) that gets inside the labyrinth (serous labyrinthitis). What increases the risk? You may be at greater risk for labyrinthitis if you: Recently had a mouth, nose, or throat infection (upper respiratory infection) or an ear infection. Drink a lot of alcohol. Smoke. Use certain drugs. Are feeling tired (fatigued). Are experiencing a lot of stress. Have allergies. What are the signs or symptoms? Symptoms of labyrinthitis usually start suddenly. The symptoms may range from mild to severe, and may include: Dizziness. Hearing loss. A feeling that you or your surroundings are moving when they are not (vertigo). Ringing in your ear (tinnitus). Nausea and vomiting. Trouble focusing your eyes. Symptoms of chronic labyrinthitis may include: Fatigue. Confusion. Hearing loss. Tinnitus. Poor balance. Vertigo after sudden head movements. How is this diagnosed? This condition may be diagnosed based on: Your symptoms and medical history. Your health care provider may ask about any dizziness or hearing loss you have and any recent upper respiratory infections. A physical exam that involves: Checking your ears for infection. Testing your balance. Checking  your eye movement. Hearing tests. Imaging tests, such as a CT scan or an MRI. Tests of your eye movements (electronystagmogram, or ENG). How is this treated? Treatment depends on the cause. If your condition is caused by bacteria, you may need antibiotic medicine. If it is caused by a virus, it may get better on its own. Regardless  of the cause, you may be treated with: Medicines to: Stop dizziness. Relieve nausea. Reduce inflammation. Speed your recovery. IV fluids. These may be given at a hospital. You may need IV fluids if you have severe nausea and vomiting. Physical therapy. A therapist can teach you exercises to help you adjust to feeling dizzy (vestibular rehabilitation exercises). You may need this if you have dizziness that does not go away. Follow these instructions at home: Medicines Take over-the-counter and prescription medicines only as told by your health care provider. If you were prescribed an antibiotic medicine, take it as told by your health care provider. Do not stop taking the antibiotic even if you start to feel better. Activity Rest as told by your health care provider. Limit your activity as directed. Ask your health care provider what activities are safe for you. Do not make sudden movements until any dizziness goes away. If physical therapy was prescribed, do exercises as directed. General instructions Avoid loud noises and bright lights. Do not drive until your health care provider says that this is safe for you. Drink enough fluid to keep your urine pale yellow. Keep all follow-up visits. This is important. Contact a health care provider if you have: Symptoms that do not get better with medicine. Symptoms that last longer than 2 weeks. A fever. Get help right away if you have: Nausea or vomiting that is severe or does not go away. Severe dizziness. Sudden hearing loss. Summary Labyrinthitis is an infection of the inner ear. It can cause changes in hearing and balance or vertigo. Symptoms usually start suddenly and include dizziness, hearing loss, nausea, and vomiting. You may also have ringing in your ear (tinnitus), trouble focusing your eyes, and vertigo. If the condition lasts more than a few weeks, symptoms may include fatigue, confusion, hearing loss, poor balance, tinnitus,  and vertigo. Treatment depends on the cause. If your labyrinthitis is caused by bacteria, you may need antibiotic medicine. If your labyrinthitis is caused by a virus, it may get better on its own. Follow your health care provider's instructions, including how to take medicines, what activities to avoid, and when to get medical help. This information is not intended to replace advice given to you by your health care provider. Make sure you discuss any questions you have with your health care provider. Document Revised: 02/05/2020 Document Reviewed: 02/05/2020 Elsevier Patient Education  2022 [employer]. Transient Ischemic Attack A transient ischemic attack (TIA) causes stroke-like symptoms that go away quickly. Having a TIA means that a person is at higher risk for a stroke. A TIA happens when blood supply to the brain is blocked temporarily. A TIA is a medical emergency. What are the causes? This condition is caused by a temporary blockage in an artery in the head or neck. This means the brain does not get the blood supply it needs. There is no permanent brain damage with a TIA. A blockage can be caused by: Fatty buildup in an artery in the head or neck (atherosclerosis). A blood clot. An artery tear (dissection). Inflammation of an artery (vasculitis). Sometimes the cause is not known. What increases the risk?  Certain factors may make you more likely to develop this condition. Some of these are things that you can change, such as: Obesity. Using products that contain nicotine or tobacco. Taking oral birth control, especially if you also use tobacco. Not being active. Heavy alcohol use. Drug use, especially cocaine and methamphetamine. Medical conditions that may increase your risk include: High blood pressure (hypertension). High cholesterol. Diabetes. Heart disease (coronary artery disease). An irregular heartbeat, also called atrial fibrillation (AFib). Sickle cell disease. Sleep  problems (sleep apnea). Chronic inflammatory diseases, such as rheumatoid arthritis or lupus. Blood clotting disorders (hypercoagulable state). Other risk factors include: Being over the age of 67. Being male. Family history of stroke. Previous history of blood clots, stroke, TIA, or heart attack. Having a history of preeclampsia. Migraine headache. What are the signs or symptoms? Symptoms of a TIA are the same as those of a stroke. The symptoms develop suddenly, and then go away quickly. They may include: Weakness or numbness in your face, arm, or leg, especially on one side of your body. Trouble walking or moving your arms or legs. Trouble speaking, understanding speech, or both (aphasia). Vision changes, such as double vision, blurred vision, or loss of vision. Dizziness. Confusion. Loss of balance or coordination. Nausea and vomiting. Severe headache. If possible, note what time your symptoms started. Tell your health care provider. How is this diagnosed? This condition may be diagnosed based on: Your symptoms and medical history. A physical exam. Imaging tests, usually a CT scan or MRI of the brain. Blood tests. You may also have other tests, including: Electrocardiogram (ECG). Echocardiogram. Carotid ultrasound. A scan of blood circulation in the brain (CT angiogram or MR angiogram). Continuous heart monitoring. How is this treated? The goal of treatment is to reduce the risk for a stroke. Stroke prevention therapies may include: Changes to diet and lifestyle, such as being physically active and stopping smoking. Medicines to thin the blood (antiplatelets or anticoagulants). Blood pressure medicines. Medicines to reduce cholesterol. Treating other health conditions, such as diabetes or AFib. If testing shows a narrowing in the arteries to your brain, your health care provider may recommend a procedure, such as: Carotid endarterectomy. This is done to remove the  blockage from your artery. Carotid angioplasty and stenting. This uses a tube (stent) to open or widen an artery in the neck. The stent helps keep the artery open by supporting the artery walls. Follow these instructions at home: Medicines Take over-the-counter and prescription medicines only as told by your health care provider. If you were told to take a medicine to thin your blood, such as aspirin or an anticoagulant, take it exactly as told by your health care provider. Taking too much blood-thinning medicine can cause bleeding. Taking too little will not protect you against a stroke and other problems. Eating and drinking  Eat 5 or more servings of fruits and vegetables each day. Follow guidelines from your health care provider about your diet. You may need to follow a certain diet to help manage risk factors for stroke. This may include: Eating a low-fat, low-salt diet. Choosing high-fiber foods. Limiting carbohydrates and sugar. If you drink alcohol: Limit how much you have to: 0-1 drink a day for women who are not pregnant. 0-2 drinks a day for men. Know how much alcohol is in a drink. In the [place] one drink equals one 12 oz bottle of beer (364m), one 5 oz glass of wine (1465m, or one 1 oz glass of hard liquor (  35m). General instructions Maintain a healthy weight. Try to get at least 30 minutes of exercise on most days. Get treatment if you have sleep apnea. Do not use any products that contain nicotine or tobacco. These products include cigarettes, chewing tobacco, and vaping devices, such as e-cigarettes. If you need help quitting, ask your health care provider. Do not use drugs. Keep all follow-up visits. This is important. Where to find more information American Stroke Association: www.stroke.org Get help right away if: You have chest pain or an irregular heartbeat. You have any symptoms of a stroke. "BE FAST" is an easy way to remember the main warning signs of a  stroke. B - Balance. Signs are dizziness, sudden trouble walking, or loss of balance. E - Eyes. Signs are trouble seeing or a sudden change in vision. F - Face. Signs are sudden weakness or numbness of the face, or the face or eyelid drooping on one side. A - Arms. Signs are weakness or numbness in an arm. This happens suddenly and usually on one side of the body. S - Speech. Signs are sudden trouble speaking, slurred speech, or trouble understanding what people say. T - Time. Time to call emergency services. Write down what time symptoms started. You have other signs of a stroke, such as: A sudden, severe headache with no known cause. Nausea or vomiting. Seizure. These symptoms may represent a serious problem that is an emergency. Do not wait to see if the symptoms will go away. Get medical help right away. Call your local emergency services (911 in the [place] Do not drive yourself to the hospital. Summary A transient ischemic attack (TIA) happens when an artery in the head or neck is blocked. The blockage clears before there is any permanent brain damage. A TIA is a medical emergency. Symptoms of a TIA are the same as those of a stroke. The symptoms develop suddenly, and then go away quickly. Having a TIA means that you are at higher risk for a stroke. The goal of treatment is to reduce your risk for a stroke. Treatment may include medicines to thin the blood and changes to diet and lifestyle. This information is not intended to replace advice given to you by your health care provider. Make sure you discuss any questions you have with your health care provider. Document Revised: 07/23/2019 Document Reviewed: 07/23/2019 Elsevier Patient Education  [employer]

## 2020-10-01 NOTE — Progress Notes (Signed)
[id number]    Provider:  Dr Jaynee Eagles Requesting Provider: Binnie Rail, MD Primary Care Provider:  Binnie Rail, MD  CC:  TIA, Mild cognitive impairment  10/01/2020: Played tennis yesterday, feeling great. Patient here for TIA. I last saw him in 2021 for memory issues and sent him for formal memory testing which he declined and did not complete. Per Dr. Les Pou note: " He did not wish to complete full testing even when offered an abbreviated battery, and wanted to "think it over" and talk with his wife. He is screening in the MCI range on the MoCA and the normal range on the MMSE and presumably still has decision making capacity, as he presented as fairly reasonable. "  He has a past medical history of memory concerns (see above, likely mild cognitive impairment, he declined formal neurocognitive testing).  Crohn's disease, TIA, hearing loss, osteopenia, B12 deficiency, A. fib, glaucoma with right visual impairment, remote TIA who presented with strokelike symptoms to the emergency room a week ago.  He got up about 2 AM.  The bathroom, he fell backwards, he made it back to bed, in the morning he had the same issue with similar result, he got up again about 20 minutes later and got in the shower, his wife found him there, he was dizzy which worsened with changing of position.  His right face was drooping a little was admitted for TIA/CVA work-up.  Symptoms resolved.  MRI, TTE, CTA head and neck all unremarkable making CVA unlikely, he could have had mild benign positional vertigo, he is already taking Eliquis at baseline and was discharged symptom-free.  Wife says patient got up at 2am and he felt dizzy, he got to he bathroom and he felt the way back to the bed and went back to bed. At 6:30am he was in the shower, wife provides most information, he told her he felt weird, he felt more lightheaded than dizzy, didn't feel so bad that he would pass out, he just could not get up and  walk normally because he felt imbalanced. He couldn't walk straight back to the bed, movement of the head made it worse, he was lucid, no confusion, knew what was going on, a little droopiness, had not missed eliquis, went to the ED, still lucid. He was admitted and monitored, he had some therapy. Had echo, mri, CTs. Had resolved by the time he left the hospital. Feel memory is table maybe even improved we can hold off on   Reviewed images and agree with the following MRI brain:   Hgba1c 5.1 Ldl 50   Other: None.   IMPRESSION: 1. No acute intracranial abnormality. 2. Mild chronic small vessel ischemic disease.  CTA H&N: IMPRESSION: No acute intracranial abnormality.   No large vessel occlusion, hemodynamically significant stenosis, or evidence of dissection.  Echo:  9/14/202  Left Ventricle: Left ventricular ejection fraction, by estimation, is 55  to 60%. Left ventricular ejection fraction by PLAX is 57 %. The left  ventricle has normal function. The left ventricle demonstrates regional  wall motion abnormalities. The left  ventricular internal cavity size was normal in size. There is no left  ventricular hypertrophy. Left ventricular diastolic parameters are  consistent with Grade I diastolic dysfunction (impaired relaxation).  Normal left ventricular filling pressure.  HPI 10/11/2019:  Michael [name] is a 87 y.o. male here as requested by Binnie Rail, MD for memory ocncerns. PMHx Crohn's disease, TIA, SBO, hearing loss, osteopenia, B12  deficiency. Here with wife who provides most information. 6-8 months ago he became very confused about how to turn the phone on and off. He was confused with the remote. He called wife and asked her to come home. They would find his cell phone and couldn't remember. He was easily frustrated during that time. He did not have a UTI. No outward sign of a stroke. Thought it may be time to see someone about his memory. She thinks the Covid isolation  "did him in" per wife, he is very social and he needed more. He has had other signs of dementia along the way. Has been better since he has been getting out but still notciing some confusion and short-term memory loss. He performs all his own ADLs, he is a huge sports nut, he is walking and playing tennis. He takes his own medications. Wife handles all the financial things. He knows what time and what TV shows are coming on. But the incident on 9/1 was different when he had the stroke. His mother died at 66 and had multiple strokes. He was also weaning off of budesonide. No other focal neurologic deficits, associated symptoms, inciting events or modifiable factors.   Reviewed notes, labs and imaging from outside physicians, which showed:  MRI brain 09/11/2019: Mild to moderate chronic microvascular ischemic changes. Small chronic left thalamic infarct. Personally reviewed images and agree. Reviewed with patient and wife as well.  hgba1c 5.4, ldl 60  Review of Systems: Patient complains of symptoms per HPI as well as the following symptoms: dizziness, memory loss . Pertinent negatives and positives per HPI. All others negative    Social History   Socioeconomic History   Marital status: Married    Spouse name: Not on file   Number of children: 3   Years of education: Not on file   Highest education level: Not on file  Occupational History   Occupation: Retired  Tobacco Use   Smoking status: Former    Types: Cigarettes    Quit date: 04/01/1976    Years since quitting: 44.5   Smokeless tobacco: Never  Vaping Use   Vaping Use: Never used  Substance and Sexual Activity   Alcohol use: Yes    Alcohol/week: 2.0 standard drinks    Types: 2 Glasses of wine per week    Comment: social   Drug use: No   Sexual activity: Never  Other Topics Concern   Not on file  Social History Narrative   Not on file   Social Determinants of Health   Financial Resource Strain: Not on file  Food  Insecurity: Not on file  Transportation Needs: Not on file  Physical Activity: Not on file  Stress: Not on file  Social Connections: Not on file  Intimate Partner Violence: Not on file    Family History  Problem Relation Age of Onset   Deep vein thrombosis Mother    Transient ischemic attack Mother    Heart disease Father    Cancer Maternal Aunt        unknown type; dx after 21   Cancer Cousin        maternal cousin; unknown type   Cancer Cousin        paternal cousin; unknown type; dx after 41   Stomach cancer Neg Hx    Colon cancer Neg Hx    Esophageal cancer Neg Hx    Pancreatic cancer Neg Hx    Rectal cancer Neg Hx    Colon  polyps Neg Hx     Past Medical History:  Diagnosis Date   Adrenal insufficiency (HCC)    Anemia    Atrial fibrillation (HCC)    Avascular necrosis (HCC)    Clavicle fracture 11/02/2017   Colon polyp    Colon polyps    Crohn's disease ([place])    Glaucoma    Inguinal hernia recurrent bilateral    Low testosterone    Osteopenia    Retinal ischemia     Patient Active Problem List   Diagnosis Date Noted   Vestibular disequilibrium    PAF (paroxysmal atrial fibrillation) ([place]) 09/23/2020   Genetic testing 04/27/2020   Aortic atherosclerosis ([place]) 12/24/2019   TIA (transient ischemic attack) 09/11/2019   Personal history of colonic polyps    Benign neoplasm of colon    Overactive bladder 05/06/2019   Memory changes 05/06/2019   Polyp of large intestine    Asymptomatic varicose veins of left lower extremity 12/16/2017   Hyperglycemia 11/02/2017   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   Vitamin D deficiency 11/02/2017   History of DVT of lower extremity, left leg 07/2017 08/08/2017   SBO (small bowel obstruction) ([place]) 07/05/2017   Bilateral hearing loss 05/31/2017   B12 deficiency 02/06/2017   Osteopenia 02/06/2017   Glaucoma 12/12/2015   Crohn's disease ([place]) 12/12/2015   Anemia 12/12/2015   Retinal ischemia 12/06/2011    Past Surgical  History:  Procedure Laterality Date   ABDOMINAL SURGERY     BIOPSY  03/19/2020   Procedure: BIOPSY;  Surgeon: Yetta Flock, MD;  Location: [facility];  Service: Gastroenterology;;   COLONOSCOPY N/A 12/13/2015   Procedure: COLONOSCOPY;  Surgeon: Laurence Spates, MD;  Location: [facility];  Service: Endoscopy;  Laterality: N/A;   COLONOSCOPY WITH PROPOFOL N/A 03/25/2019   Procedure: COLONOSCOPY WITH PROPOFOL;  Surgeon: Yetta Flock, MD;  Location: [facility];  Service: Gastroenterology;  Laterality: N/A;   COLONOSCOPY WITH PROPOFOL N/A 08/26/2019   Procedure: COLONOSCOPY WITH PROPOFOL;  Surgeon: Yetta Flock, MD;  Location: [facility];  Service: Gastroenterology;  Laterality: N/A;   COLONOSCOPY WITH PROPOFOL N/A 03/19/2020   Procedure: COLONOSCOPY WITH PROPOFOL;  Surgeon: Yetta Flock, MD;  Location: [facility];  Service: Gastroenterology;  Laterality: N/A;   ENDOSCOPIC MUCOSAL RESECTION N/A 08/26/2019   Procedure: ENDOSCOPIC MUCOSAL RESECTION;  Surgeon: Yetta Flock, MD;  Location: [facility];  Service: Gastroenterology;  Laterality: N/A;   INGUINAL HERNIA REPAIR Bilateral 05/21/2014   Procedure: OPEN BILATERAL INGUINAL HERNIA REPAIRS WITH MESH;  Surgeon: Donnie Mesa, MD;  Location: [facility];  Service: General;  Laterality: Bilateral;   POLYPECTOMY  03/25/2019   Procedure: POLYPECTOMY;  Surgeon: Yetta Flock, MD;  Location: [facility];  Service: Gastroenterology;;   POLYPECTOMY  08/26/2019   Procedure: POLYPECTOMY;  Surgeon: Yetta Flock, MD;  Location: [facility];  Service: Gastroenterology;;   POLYPECTOMY  03/19/2020   Procedure: POLYPECTOMY;  Surgeon: Yetta Flock, MD;  Location: [facility];  Service: Gastroenterology;;   [place]ward Ku  08/26/2019   Procedure: Sprayed methylene blue;  Surgeon: Yetta Flock, MD;  Location: [facility];  Service: Gastroenterology;;   [place], 2001    related to Chrohn's disease   TONSILLECTOMY      Current Outpatient Medications  Medication Sig Dispense Refill   acetaminophen (TYLENOL) 500 MG tablet Take 500-1,000 mg by mouth every 8 (eight) hours as needed for mild pain or headache.      apixaban (ELIQUIS)  5 MG TABS tablet Take 1 tablet (5 mg total) by mouth 2 (two) times daily. 28 tablet 0   Ascorbic Acid (VITAMIN C) 1000 MG tablet Take 1,000 mg by mouth daily with breakfast.      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      brimonidine (ALPHAGAN) 0.2 % ophthalmic solution Place 1 drop into the left eye in the morning and at bedtime. Instill 1 drop into the left eye twice a day- 6:45 AM and 5:50 PM     budesonide (ENTOCORT EC) 3 MG 24 hr capsule Take 6 mg by mouth daily as needed (crohn's disease).     calcium carbonate (CALCIUM 600) 600 MG TABS tablet Take 1 tablet (600 mg total) by mouth daily. 30 tablet    Cholecalciferol (VITAMIN D3) 50 MCG (2000 UT) TABS Take 2,000 Units by mouth daily with breakfast.     cyanocobalamin (,VITAMIN B-12,) 1000 MCG/ML injection INJECT 1 ML INTO THE MUSCLE EVERY 30 DAYS (Patient taking differently: Inject 1,000 mcg into the muscle every 30 (thirty) days. INJECT 1 ML INTO THE MUSCLE EVERY 30 DAYS) 10 mL 1   dorzolamide-timolol (COSOPT) 22.3-6.8 MG/ML ophthalmic solution Place 1 drop into the left eye 2 (two) times daily. Instill 1 drop into the left eye twice a day- 6:35 AM and 5:30 PM     ferrous sulfate 325 (65 FE) MG tablet Take 325 mg by mouth 3 (three) times a week. In the morning     latanoprost (XALATAN) 0.005 % ophthalmic solution Place 1 drop into the left eye at bedtime. Instill 1 drop into the left eye at 6:10 PM daily     loperamide (IMODIUM A-D) 2 MG tablet Take 2 mg by mouth daily.     metoprolol succinate (TOPROL XL) 25 MG 24 hr tablet Take 0.5 tablets (12.5 mg total) by  mouth at bedtime. 30 tablet 3   Netarsudil Dimesylate 0.02 % SOLN Place 1 drop into the left eye at bedtime. Instill 1 drop into the left eye at 6:15 PM daily     omeprazole (PRILOSEC OTC) 20 MG tablet Take 20 mg by mouth daily before breakfast.     prednisoLONE acetate (PRED FORTE) 1 % ophthalmic suspension Place 1 drop into the right eye in the morning, at noon, in the evening, and at bedtime. Instill 1 drop into the right eye four times a day- 7:00 AM, 12:00 noon, 4:00 PM, and 6:00 PM     Syringe/Needle, Disp, (SYRINGE 3CC/25GX1") 25G X 1" 3 ML MISC Use for monthly B12 injections 12 each 0   tamsulosin (FLOMAX) 0.4 MG CAPS capsule Take 0.4 mg by mouth at bedtime.     No current facility-administered medications for this visit.    Allergies as of 10/01/2020   (No Known Allergies)    Vitals: BP (!) 171/82   Pulse 63   Ht 5' 11"  (1.803 m)   Wt 180 lb 9.6 oz (81.9 kg)   BMI 25.19 kg/m  Last Weight:  Wt Readings from Last 1 Encounters:  10/01/20 180 lb 9.6 oz (81.9 kg)   Last Height:   Ht Readings from Last 1 Encounters:  10/01/20 5' 11"  (1.803 m)   Exam: NAD, pleasant                  Speech:    Speech is normal; fluent and spontaneous with normal comprehension.  Cognition:  MMSE - Mini Mental State Exam 10/01/2020 10/11/2019  Orientation to time 5 5  Orientation to Place 4 5  Registration 3 3  Attention/ Calculation 5 4  Recall 2 3  Language- name 2 objects 2 2  Language- repeat 0 1  Language- follow 3 step command 3 3  Language- read & follow direction 0 1  Write a sentence 1 1  Copy design 1 1  Total score 26 29      Cranial Nerves:    The pupils are equal, round, and reactive to light.Trigeminal sensation is intact and the muscles of mastication are normal. The face is symmetric. The palate elevates in the midline. Hearing intact. Voice is normal. Shoulder shrug is normal. The tongue has normal motion without fasciculations.   Coordination:  No  dysmetria  Motor Observation:    No asymmetry, no atrophy, and no involuntary movements noted. Tone:    Normal muscle tone.     Strength:    Strength is V/V in the upper and lower limbs.      Sensation: intact to LT      Assessment/Plan:  87 year old who was seen in the ED recently and with [place] about a year ago. He has a past medical history of memory concerns (evaluated here in 2021 with mmse 19/30 likely mild cognitive impairment, he declined formal neurocognitive testing).  Crohn's disease, TIA, hearing loss, osteopenia, B12 deficiency, A. fib, glaucoma with right visual impairment, remote TIA who presented with strokelike symptoms to the emergency room a week ago.  He was dizzy which worsened with changing of position and gait abnormality that resolved.  His right face was drooping a little was admitted for TIA/CVA work-up.  Symptoms resolved.  MRI, TTE, CTA head and neck all unremarkable making CVA unlikely, he could have had mild benign positional vertigo, he is already taking Eliquis at baseline and was discharged symptom-free.  His hemoglobin A1c and cholesterol are both normal, at this time we will follow clinically.  MMSE today 26 out of 30 and I was concerned for prodromal Alzheimer's disease, he went to see Dr. Nicole Kindred and neuropsych testing but then refused, likely mild cognitive impairment but he denies any memory changes in fact him and his wife state feels like he is improved, he declines any formal testing.  Continue Eliquis.  Continue following with primary care for B12 deficiency.  Return to clinic as needed.    Cc: Binnie Rail, MD,  Binnie Rail, MD  Sarina Ill, MD  [facility] [address] [place], [place] [address]  Phone [phone] Fax [phone]  I spent over 40  minutes of face-to-face and non-face-to-face time with patient on the  1. TIA (transient ischemic attack)    diagnosis.  This included previsit chart review,  lab review, study review, order entry, electronic health record documentation, patient education on the different diagnostic and therapeutic options, counseling and coordination of care, risks and benefits of management, compliance, or risk factor reduction

## 2020-10-12 ENCOUNTER — Other Ambulatory Visit: Payer: Self-pay | Admitting: Gastroenterology

## 2020-10-14 ENCOUNTER — Ambulatory Visit: Payer: Medicare Other | Admitting: Gastroenterology

## 2020-10-16 ENCOUNTER — Encounter: Payer: Self-pay | Admitting: Cardiovascular Disease

## 2020-10-16 ENCOUNTER — Ambulatory Visit: Payer: Medicare Other | Admitting: Cardiovascular Disease

## 2020-10-16 ENCOUNTER — Other Ambulatory Visit: Payer: Self-pay

## 2020-10-16 VITALS — BP 128/64 | HR 77 | Ht 71.0 in | Wt 179.6 lb

## 2020-10-16 DIAGNOSIS — I480 Paroxysmal atrial fibrillation: Secondary | ICD-10-CM

## 2020-10-16 NOTE — Progress Notes (Signed)
Chief Complaint  Patient presents with   Follow-up    Atrial fibrillation    History of Present Illness: 87 yo male with history of atrial fibrillation, TIA, memory loss, DVT, adrenal insufficiency, Crohn's disease and mild carotid artery disease here today for cardiac follow up. I saw him as a new patient in November 2021 after he had been admitted to [facility] 09/11/19 with a TIA. Carotid artery dopplers 09/11/19 with mild bilateral carotid artery disease. Echo September 2021 with LVEF=55-60%, no valve disease. CT showed probably remote L thalamic lacunar infarct which was confirmed on MRI brain. Cardiac monitor showed atrial fibrillation. He was started on Eliquis and a beta blocker. He was admitted to [facility] September 2022 with balance issues but workup for stroke was negative with brain MRI.   He is here today for follow up. The patient denies any chest pain, dyspnea, palpitations, lower extremity edema, orthopnea, PND, dizziness, near syncope or syncope. His balance issues have resolved.   Primary Care Physician: Binnie Rail, MD  Past Medical History:  Diagnosis Date   Adrenal insufficiency [facility])    Anemia    Atrial fibrillation ([place])    Avascular necrosis ([place])    Clavicle fracture 11/02/2017   Colon polyp    Colon polyps    Crohn's disease ([place])    Glaucoma    Inguinal hernia recurrent bilateral    Low testosterone    Osteopenia    Retinal ischemia     Past Surgical History:  Procedure Laterality Date   ABDOMINAL SURGERY     BIOPSY  03/19/2020   Procedure: BIOPSY;  Surgeon: Yetta Flock, MD;  Location: [facility];  Service: Gastroenterology;;   COLONOSCOPY N/A 12/13/2015   Procedure: COLONOSCOPY;  Surgeon: Laurence Spates, MD;  Location: [facility];  Service: Endoscopy;  Laterality: N/A;   COLONOSCOPY WITH PROPOFOL N/A 03/25/2019   Procedure: COLONOSCOPY WITH PROPOFOL;  Surgeon: Yetta Flock, MD;  Location: [facility];  Service: Gastroenterology;  Laterality:  N/A;   COLONOSCOPY WITH PROPOFOL N/A 08/26/2019   Procedure: COLONOSCOPY WITH PROPOFOL;  Surgeon: Yetta Flock, MD;  Location: [facility];  Service: Gastroenterology;  Laterality: N/A;   COLONOSCOPY WITH PROPOFOL N/A 03/19/2020   Procedure: COLONOSCOPY WITH PROPOFOL;  Surgeon: Yetta Flock, MD;  Location: [facility];  Service: Gastroenterology;  Laterality: N/A;   ENDOSCOPIC MUCOSAL RESECTION N/A 08/26/2019   Procedure: ENDOSCOPIC MUCOSAL RESECTION;  Surgeon: Yetta Flock, MD;  Location: [facility];  Service: Gastroenterology;  Laterality: N/A;   INGUINAL HERNIA REPAIR Bilateral 05/21/2014   Procedure: OPEN BILATERAL INGUINAL HERNIA REPAIRS WITH MESH;  Surgeon: Donnie Mesa, MD;  Location: [place];  Service: General;  Laterality: Bilateral;   POLYPECTOMY  03/25/2019   Procedure: POLYPECTOMY;  Surgeon: Yetta Flock, MD;  Location: [facility];  Service: Gastroenterology;;   POLYPECTOMY  08/26/2019   Procedure: POLYPECTOMY;  Surgeon: Yetta Flock, MD;  Location: [facility];  Service: Gastroenterology;;   POLYPECTOMY  03/19/2020   Procedure: POLYPECTOMY;  Surgeon: Yetta Flock, MD;  Location: [facility];  Service: Gastroenterology;;   [place] Ku  08/26/2019   Procedure: Sprayed methylene blue;  Surgeon: Yetta Flock, MD;  Location: [facility];  Service: Gastroenterology;;   [place], 2001    related to Chrohn's disease   TONSILLECTOMY      Current Outpatient Medications  Medication Sig Dispense Refill   acetaminophen (TYLENOL) 500 MG tablet Take 500-1,000 mg by mouth every 8 (eight)  hours as needed for mild pain or headache.      apixaban (ELIQUIS) 5 MG TABS tablet Take 1 tablet (5 mg total) by mouth 2 (two) times daily. 28 tablet 0   Ascorbic Acid (VITAMIN C) 1000 MG tablet Take 1,000 mg by mouth daily with breakfast.      augmented betamethasone dipropionate (DIPROLENE-AF) 0.05 % cream  Apply 1 application topically 2 (two) times daily as needed ([place]'s disease).      brimonidine (ALPHAGAN) 0.2 % ophthalmic solution Place 1 drop into the left eye in the morning and at bedtime. Instill 1 drop into the left eye twice a day- 6:45 AM and 5:50 PM     budesonide (ENTOCORT EC) 3 MG 24 hr capsule Take 6 mg by mouth daily as needed (crohn's disease).     calcium carbonate (CALCIUM 600) 600 MG TABS tablet Take 1 tablet (600 mg total) by mouth daily. 30 tablet    Cholecalciferol (VITAMIN D3) 50 MCG (2000 UT) TABS Take 2,000 Units by mouth daily with breakfast.     cyanocobalamin (,VITAMIN B-12,) 1000 MCG/ML injection INJECT 1 ML INTO THE MUSCLE EVERY 30 DAYS (Patient taking differently: Inject 1,000 mcg into the muscle every 30 (thirty) days. INJECT 1 ML INTO THE MUSCLE EVERY 30 DAYS) 10 mL 1   dorzolamide-timolol (COSOPT) 22.3-6.8 MG/ML ophthalmic solution Place 1 drop into the left eye 2 (two) times daily. Instill 1 drop into the left eye twice a day- 6:35 AM and 5:30 PM     ferrous sulfate 325 (65 FE) MG tablet Take 325 mg by mouth 3 (three) times a week. In the morning     latanoprost (XALATAN) 0.005 % ophthalmic solution Place 1 drop into the left eye at bedtime. Instill 1 drop into the left eye at 6:10 PM daily     loperamide (IMODIUM A-D) 2 MG tablet Take 2 mg by mouth daily.     metoprolol succinate (TOPROL XL) 25 MG 24 hr tablet Take 0.5 tablets (12.5 mg total) by mouth at bedtime. 30 tablet 3   Netarsudil Dimesylate 0.02 % SOLN Place 1 drop into the left eye at bedtime. Instill 1 drop into the left eye at 6:15 PM daily     omeprazole (PRILOSEC OTC) 20 MG tablet Take 20 mg by mouth daily before breakfast.     prednisoLONE acetate (PRED FORTE) 1 % ophthalmic suspension Place 1 drop into the right eye in the morning, at noon, in the evening, and at bedtime. Instill 1 drop into the right eye four times a day- 7:00 AM, 12:00 noon, 4:00 PM, and 6:00 PM     Syringe/Needle, Disp, (SYRINGE  3CC/25GX1") 25G X 1" 3 ML MISC Use for monthly B12 injections 12 each 0   tamsulosin (FLOMAX) 0.4 MG CAPS capsule Take 0.4 mg by mouth at bedtime.     No current facility-administered medications for this visit.    No Known Allergies  Social History   Socioeconomic History   Marital status: Married    Spouse name: Not on file   Number of children: 3   Years of education: Not on file   Highest education level: Not on file  Occupational History   Occupation: Retired  Tobacco Use   Smoking status: Former    Types: Cigarettes    Quit date: 04/01/1976    Years since quitting: 44.5   Smokeless tobacco: Never  Vaping Use   Vaping Use: Never used  Substance and Sexual Activity   Alcohol  use: Yes    Alcohol/week: 2.0 standard drinks    Types: 2 Glasses of wine per week    Comment: social   Drug use: No   Sexual activity: Never  Other Topics Concern   Not on file  Social History Narrative   Not on file   Social Determinants of Health   Financial Resource Strain: Not on file  Food Insecurity: Not on file  Transportation Needs: Not on file  Physical Activity: Not on file  Stress: Not on file  Social Connections: Not on file  Intimate Partner Violence: Not on file    Family History  Problem Relation Age of Onset   Deep vein thrombosis Mother    Transient ischemic attack Mother    Heart disease Father    Cancer Maternal Aunt        unknown type; dx after 49   Cancer Cousin        maternal cousin; unknown type   Cancer Cousin        paternal cousin; unknown type; dx after 53   Stomach cancer Neg Hx    Colon cancer Neg Hx    Esophageal cancer Neg Hx    Pancreatic cancer Neg Hx    Rectal cancer Neg Hx    Colon polyps Neg Hx     Review of Systems:  As stated in the HPI and otherwise negative.   BP 128/64   Pulse 77   Ht 5' 11"  (1.803 m)   Wt 179 lb 9.6 oz (81.5 kg)   SpO2 96%   BMI 25.05 kg/m   Physical Examination: General: Well developed, well nourished,  NAD  HEENT: OP clear, mucus membranes moist  SKIN: warm, dry. No rashes. Neuro: No focal deficits  Musculoskeletal: Muscle strength 5/5 all ext  Psychiatric: Mood and affect normal  Neck: No JVD, no carotid bruits, no thyromegaly, no lymphadenopathy.  Lungs:Clear bilaterally, no wheezes, rhonci, crackles Cardiovascular: Regular rate and rhythm. No murmurs, gallops or rubs. Abdomen:Soft. Bowel sounds present. Non-tender.  Extremities: No lower extremity edema. Pulses are 2 + in the bilateral DP/PT.  EKG:  EKG is not  ordered today. The ekg ordered today demonstrates    Echo September 2021:  1. Left ventricular ejection fraction, by estimation, is 55 to 60%. The  left ventricle has normal function. The left ventricle has no regional  wall motion abnormalities. Left ventricular diastolic parameters are  consistent with Grade I diastolic  dysfunction (impaired relaxation).   2. Right ventricular systolic function is normal. The right ventricular  size is normal. There is normal pulmonary artery systolic pressure.   3. Left atrial size was mildly dilated.   4. The mitral valve is normal in structure. No evidence of mitral valve  regurgitation. No evidence of mitral stenosis.   5. The aortic valve is normal in structure. Aortic valve regurgitation is  not visualized. No aortic stenosis is present.   6. The inferior vena cava is normal in size with greater than 50%  respiratory variability, suggesting right atrial pressure of 3 mmHg.   7. Agitated saline contrast bubble study was negative, with no evidence  of any interatrial shunt.   Recent Labs: 12/24/2019: TSH 2.11 09/29/2020: ALT 10; BUN 13; Creatinine, Ser 0.88; Hemoglobin 12.5; Platelets 186.0; Potassium 3.7; Sodium 136   Lipid Panel    Component Value Date/Time   CHOL 110 09/24/2020 0500   TRIG 92 09/24/2020 0500   HDL 42 09/24/2020 0500   CHOLHDL 2.6 09/24/2020  0500   VLDL 18 09/24/2020 0500   LDLCALC 50 09/24/2020 0500    LDLDIRECT 60.6 09/11/2019 1401     Wt Readings from Last 3 Encounters:  10/16/20 179 lb 9.6 oz (81.5 kg)  10/01/20 180 lb 9.6 oz (81.9 kg)  09/29/20 181 lb (82.1 kg)    Assessment and Plan:   1. Atrial fibrillation, paroxysmal: Doing well on current therapy. No complaints. He is in sinus today. Will continue beta blocker and Eliquis.    Current medicines are reviewed at length with the patient today.  The patient does not have concerns regarding medicines.  The following changes have been made:  no change  Labs/ tests ordered today include:   No orders of the defined types were placed in this encounter.    Disposition:   F/U with me in 12 months.    Signed, Lauree Chandler, MD 10/16/2020 3:47 PM    [facility] [address], [place], [place]  [address] Phone: [phone]; Fax: [phone]

## 2020-10-16 NOTE — Patient Instructions (Signed)
Medication Instructions:  No changes *If you need a refill on your cardiac medications before your next appointment, please call your pharmacy*   Lab Work: none  Testing/Procedures: none   Follow-Up: At [facility], you and your health needs are our priority.  As part of our continuing mission to provide you with exceptional heart care, we have created designated Provider Care Teams.  These Care Teams include your primary Cardiologist (physician) and Advanced Practice Providers (APPs -  Physician Assistants and Nurse Practitioners) who all work together to provide you with the care you need, when you need it.   Your next appointment:   12 month(s)  The format for your next appointment:   In Person  Provider:   You may see Lauree Chandler, MD or one of the following Advanced Practice Providers on your designated Care Team:   Melina Copa, PA-C Ermalinda Barrios, PA-C   Other Instructions

## 2020-11-10 ENCOUNTER — Other Ambulatory Visit (HOSPITAL_COMMUNITY): Payer: Self-pay | Admitting: Nurse Practitioner

## 2020-11-12 ENCOUNTER — Encounter: Payer: Self-pay | Admitting: Gastroenterology

## 2020-11-12 ENCOUNTER — Ambulatory Visit: Payer: Medicare Other | Admitting: Gastroenterology

## 2020-11-12 VITALS — BP 124/74 | HR 62 | Ht 71.0 in | Wt 181.4 lb

## 2020-11-12 DIAGNOSIS — K50019 Crohn's disease of small intestine with unspecified complications: Secondary | ICD-10-CM | POA: Diagnosis not present

## 2020-11-12 DIAGNOSIS — Z86010 Personal history of colonic polyps: Secondary | ICD-10-CM

## 2020-11-12 NOTE — Progress Notes (Signed)
HPI :  87 year old male here for follow-up for Crohn's disease.    Crohn's history: Previously followed by Dr. Earle Gell. 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 He has had 2 operations for his Crohns in 1968 and then again 2001 or so for small bowel resections. He remotely was treated with prednisone as needed in the past. He was never been on any other maintenance therapy for Crohn's disease other than prednisone and surgery. He has had hospitalizations in the past for bowel obstructions. Unfortunately he has suffered complications from chronic steroid use. He is currently being followed by Endocrinology for adrenal insufficiency. He has also been noted to have hepatic steatosis and avascular necrosis of the hips on CT scan. His course has also been complicated by history of diverticulosis related bleeding. Most recently has been using budesonide for flares as needed, and noted to have numerous colon polyps.    SINCE LAST VISIT:   87 year old male here for follow-up visit for his history of colon polyps and Crohn's disease.  I last saw him in March 2022 for surveillance colonoscopy.  Recall that prior to this exam, he had 3 colonoscopies with me in 2021 for numerous large colon polyps in the setting of fair bowel preps, polyps near or at the surgical anastomosis and very challenging to remove.  His exam in March was remarkable for some mild active ileitis.  Biopsies around his surgical anastomosis were prior polyps have been removed did not show any precancerous change which is good.  He did have some recurrent polyp in the ascending colon distal to the anastomosis was removed.  In regards to his Crohn's he has been doing okay.  Recall that we have try to avoid biologic therapy for him in the past.  We had looked into getting him Entyvio as this was thought to be the lowest risk/safest regimen for him, however the cost was over $2000 per infusion and he could not afford it.  We had  discussed how aggressive he and his wife want to be with his care and he responds pretty well to budesonide and we have been using that as needed.  Over the past year he has had 2 courses of budesonide for flares of his symptoms.  His symptoms are usually abdominal pain with partial obstructive type symptoms.  He often will go to a liquid diet at onset of symptoms for a day or 2 and this can really make a difference.  If it does not help he will then take the budesonide.  He has not had any hospitalizations for his Crohn's disease since have seen him.  He recently finished out budesonide taper about a month ago.  He has been doing well in that light since that time.  Unfortunately he was readmitted to the hospital this past September with marked changes in his gait.  While he had a work-up with neurology and did not think he had a stroke, they suspected possible TIA.  He has had 2 suspected TIAs in the past year.  He has recovered okay from that but certainly a scary occurrence.  He remains on Eliquis.  He has a mild anemia with hemoglobin of 12.5, with normal iron studies this past September.  Prior evaluation: Colonoscopy 12/2015 - [name] - normal colon, patent ileocolonic anastomosis, active Crohn's ileitis   Colonoscopy 01/24/2019  - Preparation of the colon was fair. - Crohn's disease with mild ileitis. - Patent end-to-side ileo-colonic anastomosis, characterized by mild stenosis. Biopsied. -  Abnormal mucosa in the ascending colon. Biopsied. - Two 3 to 6 mm polyps in the ascending colon, removed with a cold snare. Resected and retrieved. - Eight 4 to 12 mm polyps in the transverse colon, removed with a cold snare. Resected and retrieved. - One large polyp in the transverse colon. Tattooed. Not removed as outlined above - One 10 mm polyp at the splenic flexure, removed with a cold snare. Resected and retrieved. - One 10 mm polyp in the descending colon, removed with a cold snare. Resected  and retrieved. - Five 5 to 10 mm polyps in the sigmoid colon, removed with a cold snare. Resected and retrieved. - Two 4 mm polyps polyps at the recto-sigmoid colon, removed with a cold snare. Resected and retrieved. - Diverticulosis in the entire examined colon. - Stool in the entire examined colon. - Colonic spasm. - The examination was otherwise normal.   Most polyps adenomatous Biopsies of right sided abnormality adenomatous   Colonoscopy 03/25/19  - Preparation of the colon was fair with significant spasm which prolonged this exam - Active ileal Crohn's disease as described - [name] i2. - Patent end-to-side ileo-colonic anastomosis, characterized by inflammation. - Numerous flat polyps noted in the ascending colon and proximal transverse colon as noted. Several minutes spent lavaging the colon and methylene blue used to evaluate the right colon to better delineate some of these especially the largest lesion which may extend into the surgical anastomosis. Largest transverse polyp removed as outlined, with another large transverse polyp noted today which was not removed given duration of procedure and colonic spasm.   FINAL MICROSCOPIC DIAGNOSIS:   A. COLON, RIGHT, POLYPECTOMY:  - Tubular adenoma (x2 fragments).  - No high grade dysplasia or malignancy.   B. COLON, RIGHT NEAR ANASTAMOSIS, POLYPECTOMY:  - Tubular adenoma (multiple fragments).  - No high grade dysplasia or malignancy.   C. COLON, PROXIMAL TRANSVERSE, POLYPECTOMY:  - Tubular adenoma (multiple fragments).  - No high grade dysplasia or malignancy.   D. COLON, TRANSVERSE, LARGE, POLYPECTOMY:  - Tubular adenoma (multiple fragments).  - No high grade dysplasia or malignancy.      Colonoscopy 08/26/19 - Crohn's disease with mildly active ileitis. - Patent end-to-side ileo-colonic anastomosis. - Prior polypectomy site in the ascending colon, unclear if residual polyp vs. normal variant ileal tissue given  location at the anatomosis. Area removed piecemeal using a cold snare. Resected and retrieved. - Chromoendoscopy applied to the proximal transverse and right colon. - Two 5 to 20 mm polyps in the transverse colon, removed piecemeal using a cold snare. Resected and retrieved. - Diverticulosis in the transverse colon and in the left colon. - The examination was otherwise normal. - Spatic colon treated with glucagon. Overall, very challenging exam, time needed to clear the colon, visualization difficult given spasm. I think the colon at this point has been cleared of all high risk lesions however difficult to say if there is residual / recurrence at the polyp site near the anastomosis. Under normal circumstances surgery would be recommended to remove right colon and anastomosis however given patient's age he has elected for colonoscopy surveillance.    FINAL MICROSCOPIC DIAGNOSIS:   A. COLON, RIGHT, SNARE POLYPECTOMY:  - Tubular adenoma(s)  - Negative for high-grade dysplasia or malignancy   B. COLON, TRANSVERSE, SNARE POLYPECTOMY:  - Tubular adenoma(s)  - Negative for high-grade dysplasia or malignancy   C. COLON, TRANSVERSE, SNARE POLYPECTOMY:  - Tubular adenoma(s)  - Negative for high-grade dysplasia or malignancy  Colonoscopy 03/19/20 - - Crohn's disease with mildly active ileitis. No overt polypoid tissue at the entrance of the ileum but multiple biopsies taken. - Patent end-to-end ileo-colonic anastomosis. - ? polypoid lesion in the ascending colon just distal to the anastomosis, in close approximation, vs. scar tissue or normal variant as above, very difficult to see clear borders. Area was removed with cold snare, - Diverticulosis in the transverse colon and in the left colon. - The examination was otherwise normal. No high risk lesions otherwise appreciated.  FINAL MICROSCOPIC DIAGNOSIS:   A. COLON, ASCENDING, POLYPECTOMY:  - Fragments of polypoid colonic mucosa  showing low-grade dysplasia with  a tubular architecture.  See comment  - Inflammatory polyp  - Other fragments of polypoid colonic mucosa with no specific  histopathologic changes   B. SURGICAL ANASTAMOSIS, BIOPSY:  - Colonic mucosa with nonspecific inflammatory and architectural  changes, consistent with anastomotic site  - Negative for granulomas or dysplasia    Echo 09/23/20 - EF 55-60%, grade I DD    Past Medical History:  Diagnosis Date   Adrenal insufficiency (HCC)    Anemia    Atrial fibrillation (HCC)    Avascular necrosis (HCC)    Clavicle fracture 11/02/2017   Colon polyp    Colon polyps    Crohn's disease ([place])    Glaucoma    Inguinal hernia recurrent bilateral    Low testosterone    Osteopenia    Retinal ischemia      Past Surgical History:  Procedure Laterality Date   ABDOMINAL SURGERY     BIOPSY  03/19/2020   Procedure: BIOPSY;  Surgeon: Yetta Flock, MD;  Location: [facility];  Service: Gastroenterology;;   COLONOSCOPY N/A 12/13/2015   Procedure: COLONOSCOPY;  Surgeon: Laurence Spates, MD;  Location: [facility];  Service: Endoscopy;  Laterality: N/A;   COLONOSCOPY WITH PROPOFOL N/A 03/25/2019   Procedure: COLONOSCOPY WITH PROPOFOL;  Surgeon: Yetta Flock, MD;  Location: [facility];  Service: Gastroenterology;  Laterality: N/A;   COLONOSCOPY WITH PROPOFOL N/A 08/26/2019   Procedure: COLONOSCOPY WITH PROPOFOL;  Surgeon: Yetta Flock, MD;  Location: [facility];  Service: Gastroenterology;  Laterality: N/A;   COLONOSCOPY WITH PROPOFOL N/A 03/19/2020   Procedure: COLONOSCOPY WITH PROPOFOL;  Surgeon: Yetta Flock, MD;  Location: [facility];  Service: Gastroenterology;  Laterality: N/A;   ENDOSCOPIC MUCOSAL RESECTION N/A 08/26/2019   Procedure: ENDOSCOPIC MUCOSAL RESECTION;  Surgeon: Yetta Flock, MD;  Location: [facility];  Service: Gastroenterology;  Laterality: N/A;   INGUINAL HERNIA REPAIR Bilateral 05/21/2014    Procedure: OPEN BILATERAL INGUINAL HERNIA REPAIRS WITH MESH;  Surgeon: Donnie Mesa, MD;  Location: [facility];  Service: General;  Laterality: Bilateral;   POLYPECTOMY  03/25/2019   Procedure: POLYPECTOMY;  Surgeon: Yetta Flock, MD;  Location: [facility];  Service: Gastroenterology;;   POLYPECTOMY  08/26/2019   Procedure: POLYPECTOMY;  Surgeon: Yetta Flock, MD;  Location: [facility];  Service: Gastroenterology;;   POLYPECTOMY  03/19/2020   Procedure: POLYPECTOMY;  Surgeon: Yetta Flock, MD;  Location: [facility];  Service: Gastroenterology;;   Woodward Ku  08/26/2019   Procedure: Sprayed methylene blue;  Surgeon: Yetta Flock, MD;  Location: [facility];  Service: Gastroenterology;;   SMALL INTESTINE SURGERY  1968, 2001    related to Chrohn's disease   TONSILLECTOMY     Family History  Problem Relation Age of Onset   Deep vein thrombosis Mother    Transient ischemic attack Mother  Heart disease Father    Cancer Maternal Aunt        unknown type; dx after 13   Cancer Cousin        maternal cousin; unknown type   Cancer Cousin        paternal cousin; unknown type; dx after 62   Stomach cancer Neg Hx    Colon cancer Neg Hx    Esophageal cancer Neg Hx    Pancreatic cancer Neg Hx    Rectal cancer Neg Hx    Colon polyps Neg Hx    Social History   Tobacco Use   Smoking status: Former    Types: Cigarettes    Quit date: 04/01/1976    Years since quitting: 44.6   Smokeless tobacco: Never  Vaping Use   Vaping Use: Never used  Substance Use Topics   Alcohol use: Yes    Alcohol/week: 2.0 standard drinks    Types: 2 Glasses of wine per week    Comment: social   Drug use: No   Current Outpatient Medications  Medication Sig Dispense Refill   acetaminophen (TYLENOL) 500 MG tablet Take 500-1,000 mg by mouth every 8 (eight) hours as needed for mild pain or headache.      apixaban (ELIQUIS) 5 MG TABS tablet Take 1 tablet (5 mg  total) by mouth 2 (two) times daily. 28 tablet 0   Ascorbic Acid (VITAMIN C) 1000 MG tablet Take 1,000 mg by mouth daily with breakfast.      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      brimonidine (ALPHAGAN) 0.2 % ophthalmic solution Place 1 drop into the left eye in the morning and at bedtime. Instill 1 drop into the left eye twice a day- 6:45 AM and 5:50 PM     calcium carbonate (CALCIUM 600) 600 MG TABS tablet Take 1 tablet (600 mg total) by mouth daily. 30 tablet    Cholecalciferol (VITAMIN D3) 50 MCG (2000 UT) TABS Take 2,000 Units by mouth daily with breakfast.     cyanocobalamin (,VITAMIN B-12,) 1000 MCG/ML injection INJECT 1 ML INTO THE MUSCLE EVERY 30 DAYS (Patient taking differently: Inject 1,000 mcg into the muscle every 30 (thirty) days. INJECT 1 ML INTO THE MUSCLE EVERY 30 DAYS) 10 mL 1   desmopressin (DDAVP) 0.1 MG tablet Take 0.1 mg by mouth at bedtime.     dorzolamide-timolol (COSOPT) 22.3-6.8 MG/ML ophthalmic solution Place 1 drop into the left eye 2 (two) times daily. Instill 1 drop into the left eye twice a day- 6:35 AM and 5:30 PM     ferrous sulfate 325 (65 FE) MG tablet Take 325 mg by mouth 3 (three) times a week. In the morning     latanoprost (XALATAN) 0.005 % ophthalmic solution Place 1 drop into the left eye at bedtime. Instill 1 drop into the left eye at 6:10 PM daily     loperamide (IMODIUM A-D) 2 MG tablet Take 2 mg by mouth daily.     metoprolol succinate (TOPROL-XL) 25 MG 24 hr tablet Take one-half tablet by  mouth at bedtime 45 tablet 3   Netarsudil Dimesylate 0.02 % SOLN Place 1 drop into the left eye at bedtime. Instill 1 drop into the left eye at 6:15 PM daily     omeprazole (PRILOSEC OTC) 20 MG tablet Take 20 mg by mouth daily before breakfast.     Syringe/Needle, Disp, (SYRINGE 3CC/25GX1") 25G  X 1" 3 ML MISC Use for monthly B12 injections 12 each 0    tamsulosin (FLOMAX) 0.4 MG CAPS capsule Take 0.4 mg by mouth at bedtime.     No current facility-administered medications for this visit.   No Known Allergies   Review of Systems: All systems reviewed and negative except where noted in HPI.   Lab Results  Component Value Date   WBC 7.3 09/29/2020   HGB 12.5 (L) 09/29/2020   HCT 36.4 (L) 09/29/2020   MCV 93.3 09/29/2020   PLT 186.0 09/29/2020    Lab Results  Component Value Date   CREATININE 0.88 09/29/2020   BUN 13 09/29/2020   NA 136 09/29/2020   K 3.7 09/29/2020   CL 100 09/29/2020   CO2 29 09/29/2020     Lab Results  Component Value Date   ALT 10 09/29/2020   AST 12 09/29/2020   ALKPHOS 47 09/29/2020   BILITOT 1.2 09/29/2020     Physical Exam: BP 124/74   Pulse 62   Ht 5' 11"  (1.803 m)   Wt 181 lb 6 oz (82.3 kg)   SpO2 100%   BMI 25.30 kg/m  Constitutional: Pleasant,well-developed, male in no acute distress. Neurological: Alert and oriented to person place and time. Psychiatric: Normal mood and affect. Behavior is normal.   ASSESSMENT AND PLAN: 87 year old male here for reassessment of the following:  Crohn's disease History of colon polyps  As above he has a longstanding Crohn's disease status post 2 surgical resections in the past, he has never been on maintenance therapy, history of significant steroid use over many years with complications from steroids.  In recent years he has had a mildly active ileitis but has declined biologic therapy, could not afford Entyvio, has not wished to proceed with other regimens.  As above we have elected to use budesonide as needed which has worked well for him and kept him out of the hospital over the past year.  More concerning has been the numerous large polyps in his colon over the past year that has taken multiple exams to clear.  His preps have not been too good which has led to difficulty seeing these lesions, methylene blue has been utilized in the past.  He has  had polyps growing at a surgical anastomosis and many other areas in the right colon.  His last exam looked better than those in the past.  Random biopsies around the anastomosis did not show any overt polypoid tissue/adenomatous change, I did not appreciate any high risk lesions there although he certainly at high risk for recurrence there.  He did have recurrence of another polyp distal to the anastomosis.  We discussed the goals of his care moving forward.  Goals are to keep him out of the hospital and feeling well, and secondarily to prevent colon cancer.  I have previously discussed his case at multidisciplinary conference with other GI physicians and surgeons.  If he was much younger without comorbidities we would consider surgery in most circumstances however he is high risk for surgery and we are avoiding that if possible.  Hopefully colonoscopies have removed the majority of the polypoid tissue there to reduce his risk for colon cancer although he understands he remains at high risk for colon cancer without definitive therapy.  In this light we discussed if he wants to have any more colonoscopy exams versus just monitoring for now.  I am concerned he has had 2 TIAs in the past year, the  last was in September.  I think the risks of further colonoscopy at least for now outweigh the benefits in this light.  I had a lengthy discussion with the patient and his wife about this and they are in agreement.  We will hold off on colonoscopy at this time.  He has budesonide at home to take if he has any flares of his Crohn's disease in the interim, I asked him to contact me if this occurs, and if he has flare of symptoms she should go on a liquid diet which often helps him.  His CBC is stable with normal iron studies.  He will continue his present regimen, I will see him in 6 months for reassessment, if not sooner with any questions or concerns.  He agreed  Jolly Mango, MD [facility]

## 2020-11-12 NOTE — Patient Instructions (Addendum)
If you are age 85 or older, your body mass index should be between 23-30. Your Body mass index is 25.3 kg/m. If this is out of the aforementioned range listed, please consider follow up with your Primary Care Provider.  If you are age 31 or younger, your body mass index should be between 19-25. Your Body mass index is 25.3 kg/m. If this is out of the aformentioned range listed, please consider follow up with your Primary Care Provider.   ________________________________________________________  The [facility] providers would like to encourage you to use [facility] to communicate with providers for non-urgent requests or questions.  Due to long hold times on the telephone, sending your provider a message by [facility] may be a faster and more efficient way to get a response.  Please allow 48 business hours for a response.  Please remember that this is for non-urgent requests.  _______________________________________________________  Please follow up in 6 months.   Thank you for entrusting me with your care and for choosing [facility], Dr. Glandorf Cellar

## 2020-11-16 ENCOUNTER — Encounter: Payer: Self-pay | Admitting: Internal Medicine

## 2020-11-16 DIAGNOSIS — D492 Neoplasm of unspecified behavior of bone, soft tissue, and skin: Secondary | ICD-10-CM | POA: Diagnosis not present

## 2020-11-16 DIAGNOSIS — D485 Neoplasm of uncertain behavior of skin: Secondary | ICD-10-CM | POA: Diagnosis not present

## 2020-11-16 DIAGNOSIS — L570 Actinic keratosis: Secondary | ICD-10-CM | POA: Diagnosis not present

## 2020-11-16 DIAGNOSIS — L820 Inflamed seborrheic keratosis: Secondary | ICD-10-CM | POA: Diagnosis not present

## 2020-11-16 DIAGNOSIS — L821 Other seborrheic keratosis: Secondary | ICD-10-CM | POA: Diagnosis not present

## 2020-11-16 NOTE — Progress Notes (Signed)
Subjective:    Patient ID: Michael [name], male    DOB: 1932-05-07, 87 y.o.   MRN: [id number]  This visit occurred during the SARS-CoV-2 public health emergency.  Safety protocols were in place, including screening questions prior to the visit, additional usage of staff PPE, and extensive cleaning of exam room while observing appropriate contact time as indicated for disinfecting solutions.    HPI The patient is here for an acute visit.   Lower back pain - he has never had back issues.  His lower back pain started 2-3 weeks ago.  There was no obvious cause.  The pain is located in the left lower back and radiates slightly to the buttock. It hurts with movement, especially with standing up.   If he is still he has no pain.  After walking for a little while the pain is better.  No leg pain, N/T or weakness in the leg.   He has taken Tylenol ES and it has only helped minimally.     H/o T12 mild compression deformity - CT 6/19   Medications and allergies reviewed with patient and updated if appropriate.  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      apixaban (ELIQUIS) 5 MG TABS tablet Take 1 tablet (5 mg total) by mouth 2 (two) times daily. 28 tablet 0   Ascorbic Acid (VITAMIN C) 1000 MG tablet Take 1,000 mg by mouth daily with breakfast.      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      brimonidine (ALPHAGAN) 0.2 % ophthalmic solution Place 1 drop into the left eye in the morning and at bedtime. Instill 1 drop into the left eye twice a day- 6:45 AM and 5:50 PM     calcium carbonate (CALCIUM 600) 600 MG TABS tablet Take 1 tablet (600 mg total) by mouth daily. 30 tablet    Cholecalciferol (VITAMIN D3) 50 MCG (2000 UT) TABS Take 2,000 Units by mouth daily with breakfast.     cyanocobalamin (,VITAMIN B-12,) 1000 MCG/ML injection INJECT 1 ML INTO THE MUSCLE EVERY 30 DAYS (Patient taking differently: Inject 1,000 mcg into the muscle every 30 (thirty) days. INJECT 1 ML INTO THE MUSCLE EVERY 30 DAYS) 10 mL 1   desmopressin (DDAVP) 0.1 MG tablet Take 0.1 mg by mouth at bedtime.  dorzolamide-timolol (COSOPT) 22.3-6.8 MG/ML ophthalmic solution Place 1 drop into the left eye 2 (two) times daily. Instill 1 drop into the left eye twice a day- 6:35 AM and 5:30 PM     ferrous sulfate 325 (65 FE) MG tablet Take 325 mg by mouth 3 (three) times a week. In the morning     latanoprost (XALATAN) 0.005 % ophthalmic solution Place 1 drop into the left eye at bedtime. Instill 1 drop into the left eye at 6:10 PM daily     loperamide (IMODIUM A-D) 2 MG tablet Take 2 mg by mouth daily.     metoprolol succinate (TOPROL-XL) 25 MG 24 hr tablet Take one-half tablet by  mouth at bedtime 45 tablet 3   Netarsudil Dimesylate 0.02 % SOLN Place 1 drop into  the left eye at bedtime. Instill 1 drop into the left eye at 6:15 PM daily     omeprazole (PRILOSEC OTC) 20 MG tablet Take 20 mg by mouth daily before breakfast.     Syringe/Needle, Disp, (SYRINGE 3CC/25GX1") 25G X 1" 3 ML MISC Use for monthly B12 injections 12 each 0   tamsulosin (FLOMAX) 0.4 MG CAPS capsule Take 0.4 mg by mouth at bedtime.     No current facility-administered medications on file prior to visit.    Past Medical History:  Diagnosis Date   Adrenal insufficiency (HCC)    Anemia    Atrial fibrillation (HCC)    Avascular necrosis (HCC)    Clavicle fracture 11/02/2017   Colon polyp    Colon polyps    Crohn's disease ([place])    Glaucoma    Inguinal hernia recurrent bilateral    Low testosterone    Osteopenia    Retinal ischemia     Past Surgical History:  Procedure Laterality Date   ABDOMINAL SURGERY     BIOPSY  03/19/2020   Procedure: BIOPSY;  Surgeon: Yetta Flock, MD;  Location: [facility];  Service: Gastroenterology;;   COLONOSCOPY N/A 12/13/2015   Procedure: COLONOSCOPY;  Surgeon: Laurence Spates, MD;  Location: [facility];  Service: Endoscopy;  Laterality: N/A;   COLONOSCOPY WITH PROPOFOL N/A 03/25/2019   Procedure: COLONOSCOPY WITH PROPOFOL;  Surgeon: Yetta Flock, MD;  Location: [facility];  Service: Gastroenterology;  Laterality: N/A;   COLONOSCOPY WITH PROPOFOL N/A 08/26/2019   Procedure: COLONOSCOPY WITH PROPOFOL;  Surgeon: Yetta Flock, MD;  Location: [facility];  Service: Gastroenterology;  Laterality: N/A;   COLONOSCOPY WITH PROPOFOL N/A 03/19/2020   Procedure: COLONOSCOPY WITH PROPOFOL;  Surgeon: Yetta Flock, MD;  Location: [facility];  Service: Gastroenterology;  Laterality: N/A;   ENDOSCOPIC MUCOSAL RESECTION N/A 08/26/2019   Procedure: ENDOSCOPIC MUCOSAL RESECTION;  Surgeon: Yetta Flock, MD;  Location: [facility];  Service: Gastroenterology;  Laterality: N/A;   INGUINAL HERNIA REPAIR Bilateral 05/21/2014    Procedure: OPEN BILATERAL INGUINAL HERNIA REPAIRS WITH MESH;  Surgeon: Donnie Mesa, MD;  Location: [facility];  Service: General;  Laterality: Bilateral;   POLYPECTOMY  03/25/2019   Procedure: POLYPECTOMY;  Surgeon: Yetta Flock, MD;  Location: [facility];  Service: Gastroenterology;;   POLYPECTOMY  08/26/2019   Procedure: POLYPECTOMY;  Surgeon: Yetta Flock, MD;  Location: [facility];  Service: Gastroenterology;;   POLYPECTOMY  03/19/2020   Procedure: POLYPECTOMY;  Surgeon: Yetta Flock, MD;  Location: [facility];  Service: Gastroenterology;;   [place]ward Ku  08/26/2019   Procedure: Sprayed methylene blue;  Surgeon: Yetta Flock, MD;  Location: [facility];  Service:  Gastroenterology;;   SMALL INTESTINE SURGERY  1968, 2001    related to Chrohn's disease   TONSILLECTOMY      Social History   Socioeconomic History   Marital status: Married    Spouse name: Not on file   Number of children: 3   Years of education: Not on file   Highest education level: Not on file  Occupational History   Occupation: Retired  Tobacco Use   Smoking status: Former    Types: Cigarettes    Quit date: 04/01/1976    Years since quitting: 44.6   Smokeless tobacco: Never  Vaping Use   Vaping Use: Never used  Substance and Sexual Activity   Alcohol use: Yes    Alcohol/week: 2.0 standard drinks    Types: 2 Glasses of wine per week    Comment: social   Drug use: No   Sexual activity: Never  Other Topics Concern   Not on file  Social History Narrative   Not on file   Social Determinants of Health   Financial Resource Strain: Not on file  Food Insecurity: Not on file  Transportation Needs: Not on file  Physical Activity: Not on file  Stress: Not on file  Social Connections: Not on file    Family History  Problem Relation Age of Onset   Deep vein thrombosis Mother    Transient ischemic attack Mother    Heart disease Father    Cancer  Maternal Aunt        unknown type; dx after 6   Cancer Cousin        maternal cousin; unknown type   Cancer Cousin        paternal cousin; unknown type; dx after 75   Stomach cancer Neg Hx    Colon cancer Neg Hx    Esophageal cancer Neg Hx    Pancreatic cancer Neg Hx    Rectal cancer Neg Hx    Colon polyps Neg Hx     Review of Systems     Objective:   Vitals:   11/17/20 0856  BP: 134/80  Pulse: 62  Temp: 98 F (36.7 C)  SpO2: 99%   BP Readings from Last 3 Encounters:  11/17/20 134/80  11/12/20 124/74  10/16/20 128/64   Wt Readings from Last 3 Encounters:  11/17/20 182 lb (82.6 kg)  11/12/20 181 lb 6 oz (82.3 kg)  10/16/20 179 lb 9.6 oz (81.5 kg)   Body mass index is 25.38 kg/m.   Physical Exam Constitutional:      General: He is not in acute distress.    Appearance: Normal appearance. He is not ill-appearing.  HENT:     Head: Normocephalic and atraumatic.  Musculoskeletal:        General: Tenderness (Mild tenderness left lower back-SI joint area.  No tenderness along lumbar spine, or lateral hip) present.     Right lower leg: No edema.     Left lower leg: No edema.  Skin:    General: Skin is warm and dry.     Findings: No erythema or rash.  Neurological:     Mental Status: He is alert.     Sensory: No sensory deficit.     Motor: No weakness.     Comments: Positive right straight leg raise           Assessment & Plan:    See Problem List for Assessment and Plan of chronic medical problems.

## 2020-11-17 ENCOUNTER — Ambulatory Visit (INDEPENDENT_AMBULATORY_CARE_PROVIDER_SITE_OTHER): Payer: Medicare Other | Admitting: Internal Medicine

## 2020-11-17 ENCOUNTER — Other Ambulatory Visit: Payer: Self-pay

## 2020-11-17 ENCOUNTER — Ambulatory Visit (INDEPENDENT_AMBULATORY_CARE_PROVIDER_SITE_OTHER): Payer: Medicare Other

## 2020-11-17 VITALS — BP 134/80 | HR 62 | Temp 98.0°F | Ht 71.0 in | Wt 182.0 lb

## 2020-11-17 DIAGNOSIS — M545 Low back pain, unspecified: Secondary | ICD-10-CM | POA: Diagnosis not present

## 2020-11-17 DIAGNOSIS — M533 Sacrococcygeal disorders, not elsewhere classified: Secondary | ICD-10-CM | POA: Diagnosis not present

## 2020-11-17 MED ORDER — TRAMADOL HCL 50 MG PO TABS: 50.0000 mg | ORAL_TABLET | Freq: Three times a day (TID) | ORAL | 0 refills | Status: AC | PRN

## 2020-11-17 MED ORDER — METHYLPREDNISOLONE ACETATE 80 MG/ML IJ SUSP
80.0000 mg | Freq: Once | INTRAMUSCULAR | Status: AC
  Administered 2020-11-17: 80 mg via INTRAMUSCULAR

## 2020-11-17 NOTE — Patient Instructions (Addendum)
    You received a steroid injection for your pain today.     Have xrays downstairs.     Medications changes include :   tramadol 50 mg three times a day for pain - use as needed.    Your prescription(s) have been submitted to your pharmacy. Please take as directed and contact our office if you believe you are having problem(s) with the medication(s).   A referral was ordered for sports medicine.       Someone from their office will call you to schedule an appointment.

## 2020-11-17 NOTE — Assessment & Plan Note (Signed)
Acute Left lower back pain - SI joint possibly No radiculopathy No injury or obvious cause We will get x-rays of lumbar spine, SI joints Depo-Medrol 80 mg IM x1 Car tramadol 50 mg 3 times daily as needed-discussed possible side effects Will refer to sports medicine

## 2020-11-18 ENCOUNTER — Ambulatory Visit: Payer: Medicare Other | Admitting: Family Medicine

## 2020-11-18 VITALS — BP 112/80 | HR 68 | Ht 71.0 in | Wt 181.6 lb

## 2020-11-18 DIAGNOSIS — M545 Low back pain, unspecified: Secondary | ICD-10-CM

## 2020-11-18 NOTE — Patient Instructions (Signed)
Thank you for coming in today.   I've referred you to Physical Therapy.  Let us know if you don't hear from them in one week.   Let me know if you need me to prescribe any medicine.   Recheck in 6 weeks.   Return sooner if needed.

## 2020-11-18 NOTE — Progress Notes (Signed)
I, Peterson Lombard, LAT, ATC acting as a scribe for Lynne Leader, MD.  Subjective:    CC: L-sided LBP  HPI: Pt is an 87 y/o male c/o LBP ongoing for 2-3 week w/ no obvious cause. Pt's PCP gave him a a depo 80 IM injection on 11/17/20. Pt locates pain to the L-side of his lower back w/ slight radiating pain into the L buttock. Pt notes pain is a bit better since getting this IM injection.  Radiating pain: yes- into buttock LE numbness/tingling: no LE weakness: no Aggravates: transitioning to stand- has resolved Treatments tried: IM injection, Tylenol  Dx imaging: 11/17/20 L-spine & SIJ XR  05/16/11 L-spine XR  Pertinent review of Systems: No fevers or chills  Relevant historical information: History of TIA.  History of Crohn's disease.  Osteopenia.   Objective:    Vitals:   11/18/20 1353  BP: 112/80  Pulse: 68  SpO2: 100%   General: Well Developed, well nourished, and in no acute distress.   MSK: L-spine normal-appearing Nontender midline. Nontender paraspinal musculature. Decreased lumbar motion. Lower extremity strength is intact. Reflexes sensation are intact distally.  Lab and Radiology Results No results found for this or any previous visit (from the past 72 hour(s)). DG Lumbar Spine Complete  Result Date: 11/18/2020 CLINICAL DATA:  Back pain. EXAM: LUMBAR SPINE - COMPLETE 4+ VIEW COMPARISON:  Lumbar spine radiograph dated 05/16/2011 FINDINGS: Five lumbar type vertebra. There is no acute fracture or subluxation of the lumbar spine. There is osteopenia with multilevel degenerative changes. Multilevel facet arthropathy. The visualized posterior elements are intact. The soft tissues are unremarkable. IMPRESSION: No acute/traumatic lumbar spine pathology. Electronically Signed   By: Anner Crete M.D.   On: 11/18/2020 02:41   DG Si Joints  Result Date: 11/17/2020 CLINICAL DATA:  Lower back pain on the left side EXAM: BILATERAL SACROILIAC JOINTS - 3+ VIEW  COMPARISON:  Pelvis radiograph done on 05/16/2011 FINDINGS: There is no significant narrowing of SI joint spaces. No recent fracture or dislocation is seen. Severe degenerative changes are noted at the L5-S1 level. No significant interval changes are noted. IMPRESSION: No significant radiographic abnormality is seen in the SI joints. Lumbar spondylosis at L5-S1 level. Electronically Signed   By: Elmer Picker M.D.   On: 11/17/2020 15:13    I, Lynne Leader, personally (independently) visualized and performed the interpretation of the images attached in this note.   Impression and Recommendations:    Assessment and Plan: 87 y.o. male with acute low back pain left-sided ongoing for about 2 to 3 weeks.  Pain thought to be due to myofascial spasm and dysfunction most likely of the paraspinal musculature, multifidus , or the quadratus lumborum.  Patient is a good candidate for physical therapy.  Plan to refer to PT.  Discussed medication options.  He already was prescribed tramadol by his PCP.  We discussed dosing safety of NSAIDs.  In addition we discussed potential muscle relaxers and decided to not prescribe them. Recheck in about 6 weeks.  PDMP not reviewed this encounter. Orders Placed This Encounter  Procedures   Ambulatory referral to Physical Therapy    Referral Priority:   Routine    Referral Type:   Physical Medicine    Referral Reason:   Specialty Services Required    Requested Specialty:   Physical Therapy    Number of Visits Requested:   1   No orders of the defined types were placed in this encounter.   Discussed warning  signs or symptoms. Please see discharge instructions. Patient expresses understanding.   The above documentation has been reviewed and is accurate and complete Lynne Leader, M.D.

## 2020-11-19 ENCOUNTER — Telehealth: Payer: Self-pay | Admitting: Internal Medicine

## 2020-11-19 NOTE — Telephone Encounter (Signed)
No blood work was done.  X-rays were done and Dr. Georgina Snell I believe reviewed these with him.  Basically they show arthritis and arthritic changes, but nothing concerning.

## 2020-11-19 NOTE — Telephone Encounter (Signed)
Pt. Wife called and is requesting results from labs at last appt. States that results are not viewable online, but receiving updates stating that results are ready to be viewed.    Please advise.    Callback #- [phone]

## 2020-11-19 NOTE — Telephone Encounter (Signed)
Spoke with patient's wife today.

## 2020-11-24 DIAGNOSIS — R262 Difficulty in walking, not elsewhere classified: Secondary | ICD-10-CM | POA: Diagnosis not present

## 2020-11-24 DIAGNOSIS — M2560 Stiffness of unspecified joint, not elsewhere classified: Secondary | ICD-10-CM | POA: Diagnosis not present

## 2020-11-24 DIAGNOSIS — M6281 Muscle weakness (generalized): Secondary | ICD-10-CM | POA: Diagnosis not present

## 2020-11-24 DIAGNOSIS — M545 Low back pain, unspecified: Secondary | ICD-10-CM | POA: Diagnosis not present

## 2020-11-25 DIAGNOSIS — M2560 Stiffness of unspecified joint, not elsewhere classified: Secondary | ICD-10-CM | POA: Diagnosis not present

## 2020-11-25 DIAGNOSIS — M6281 Muscle weakness (generalized): Secondary | ICD-10-CM | POA: Diagnosis not present

## 2020-11-25 DIAGNOSIS — R262 Difficulty in walking, not elsewhere classified: Secondary | ICD-10-CM | POA: Diagnosis not present

## 2020-11-25 DIAGNOSIS — M545 Low back pain, unspecified: Secondary | ICD-10-CM | POA: Diagnosis not present

## 2020-11-30 DIAGNOSIS — M6281 Muscle weakness (generalized): Secondary | ICD-10-CM | POA: Diagnosis not present

## 2020-11-30 DIAGNOSIS — M2560 Stiffness of unspecified joint, not elsewhere classified: Secondary | ICD-10-CM | POA: Diagnosis not present

## 2020-11-30 DIAGNOSIS — R262 Difficulty in walking, not elsewhere classified: Secondary | ICD-10-CM | POA: Diagnosis not present

## 2020-11-30 DIAGNOSIS — M545 Low back pain, unspecified: Secondary | ICD-10-CM | POA: Diagnosis not present

## 2020-12-01 DIAGNOSIS — R262 Difficulty in walking, not elsewhere classified: Secondary | ICD-10-CM | POA: Diagnosis not present

## 2020-12-01 DIAGNOSIS — M545 Low back pain, unspecified: Secondary | ICD-10-CM | POA: Diagnosis not present

## 2020-12-01 DIAGNOSIS — M6281 Muscle weakness (generalized): Secondary | ICD-10-CM | POA: Diagnosis not present

## 2020-12-01 DIAGNOSIS — M2560 Stiffness of unspecified joint, not elsewhere classified: Secondary | ICD-10-CM | POA: Diagnosis not present

## 2020-12-07 DIAGNOSIS — M6281 Muscle weakness (generalized): Secondary | ICD-10-CM | POA: Diagnosis not present

## 2020-12-07 DIAGNOSIS — M545 Low back pain, unspecified: Secondary | ICD-10-CM | POA: Diagnosis not present

## 2020-12-07 DIAGNOSIS — M2560 Stiffness of unspecified joint, not elsewhere classified: Secondary | ICD-10-CM | POA: Diagnosis not present

## 2020-12-07 DIAGNOSIS — R262 Difficulty in walking, not elsewhere classified: Secondary | ICD-10-CM | POA: Diagnosis not present

## 2020-12-08 DIAGNOSIS — M6281 Muscle weakness (generalized): Secondary | ICD-10-CM | POA: Diagnosis not present

## 2020-12-08 DIAGNOSIS — M2560 Stiffness of unspecified joint, not elsewhere classified: Secondary | ICD-10-CM | POA: Diagnosis not present

## 2020-12-08 DIAGNOSIS — R262 Difficulty in walking, not elsewhere classified: Secondary | ICD-10-CM | POA: Diagnosis not present

## 2020-12-08 DIAGNOSIS — M545 Low back pain, unspecified: Secondary | ICD-10-CM | POA: Diagnosis not present

## 2020-12-21 DIAGNOSIS — R262 Difficulty in walking, not elsewhere classified: Secondary | ICD-10-CM | POA: Diagnosis not present

## 2020-12-21 DIAGNOSIS — M2560 Stiffness of unspecified joint, not elsewhere classified: Secondary | ICD-10-CM | POA: Diagnosis not present

## 2020-12-21 DIAGNOSIS — M545 Low back pain, unspecified: Secondary | ICD-10-CM | POA: Diagnosis not present

## 2020-12-21 DIAGNOSIS — M6281 Muscle weakness (generalized): Secondary | ICD-10-CM | POA: Diagnosis not present

## 2020-12-22 DIAGNOSIS — M2560 Stiffness of unspecified joint, not elsewhere classified: Secondary | ICD-10-CM | POA: Diagnosis not present

## 2020-12-22 DIAGNOSIS — M6281 Muscle weakness (generalized): Secondary | ICD-10-CM | POA: Diagnosis not present

## 2020-12-22 DIAGNOSIS — R262 Difficulty in walking, not elsewhere classified: Secondary | ICD-10-CM | POA: Diagnosis not present

## 2020-12-22 DIAGNOSIS — M545 Low back pain, unspecified: Secondary | ICD-10-CM | POA: Diagnosis not present

## 2020-12-23 DIAGNOSIS — H401133 Primary open-angle glaucoma, bilateral, severe stage: Secondary | ICD-10-CM | POA: Diagnosis not present

## 2020-12-28 ENCOUNTER — Other Ambulatory Visit: Payer: Medicare Other

## 2020-12-28 ENCOUNTER — Telehealth: Payer: Self-pay | Admitting: Gastroenterology

## 2020-12-28 DIAGNOSIS — R197 Diarrhea, unspecified: Secondary | ICD-10-CM

## 2020-12-28 NOTE — Telephone Encounter (Signed)
Called and spoke with patient's wife in regards to recommendations. She states that they will stop by the lab this evening to pick up stool kit and will return sample to the lab in the AM. She is aware that we may not have the results back prior to pt's appt but it is recommended that he submit the sample as soon as he can. Pt's wife verbalized understanding and had no other concerns at the end of the call.

## 2020-12-28 NOTE — Telephone Encounter (Signed)
Inbound call from patients wife, extreme diarrhea. Seeking advice. Patient was scheduled with Anderson Malta on 12/21.

## 2020-12-28 NOTE — Telephone Encounter (Signed)
This does not sound like typical Crohn's flare. Would obtain a GI pathogen panel when he gets back into town to rule out acute infectious etiology. Thanks

## 2020-12-28 NOTE — Telephone Encounter (Signed)
Pt's wife called in and states that patient has been having excessive diarrhea for the past 3 weeks. She states that they have been at a resort and pt has not been sticking to his regular diet as much. She reports that the resort had a lot of rich foods, sodas, etc. Pt started the BRAT diet and that did help some, he also has taken 2-3 Imodium daily. Pt's wife reports that he has been having fecal incontinence in the evenings mostly and  wearing depends. She reports about 6 episodes of diarrhea. Denies any fever, abdominal pain or cramping. She states that she is not sure if the cause is infectious, from more polyps developing, or just diet. Pt has not been around anyone with similar symptoms. She states that patient was having diarrhea even on his regular diet prior to going to he resort. She does not think he is having a Crohn's flare since he is not having any cramping or pain. Pt has not taken Budesonide to see if that will help. They are on their way back into town from [place]. Advised that it is possibly diet or flare, but the only way to determine infection or inflammation would be a stool test. Advised that Dr. Havery Moros is out of the office today and we will get back with them in regards to recommendations. Pt has been scheduled for a follow up on Thursday, 12/31/21 at 8:10 am. Pt's wife verbalized understanding and had no concerns at the end of the call.

## 2020-12-30 ENCOUNTER — Ambulatory Visit: Payer: Medicare Other | Admitting: Physician Assistant

## 2020-12-30 ENCOUNTER — Ambulatory Visit: Payer: Medicare Other | Admitting: Family Medicine

## 2020-12-31 ENCOUNTER — Ambulatory Visit: Payer: Medicare Other | Admitting: Gastroenterology

## 2020-12-31 ENCOUNTER — Encounter: Payer: Self-pay | Admitting: Gastroenterology

## 2020-12-31 ENCOUNTER — Ambulatory Visit: Payer: Medicare Other | Admitting: Family Medicine

## 2020-12-31 VITALS — BP 108/64 | HR 66 | Ht 71.0 in | Wt 179.1 lb

## 2020-12-31 DIAGNOSIS — R194 Change in bowel habit: Secondary | ICD-10-CM

## 2020-12-31 DIAGNOSIS — Z86010 Personal history of colonic polyps: Secondary | ICD-10-CM | POA: Diagnosis not present

## 2020-12-31 DIAGNOSIS — K50918 Crohn's disease, unspecified, with other complication: Secondary | ICD-10-CM | POA: Diagnosis not present

## 2020-12-31 LAB — GI PROFILE, STOOL, PCR

## 2020-12-31 NOTE — Progress Notes (Signed)
HPI :  87 year old male here for follow-up for Crohn's disease.    Crohn's history: Previously followed by Dr. Earle Gell. 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 He has had 2 operations for his Crohns in 1968 and then again 2001 or so for small bowel resections. He remotely was treated with prednisone as needed in the past. He was never been on any other maintenance therapy for Crohn's disease other than prednisone and surgery. He has had hospitalizations in the past for bowel obstructions. Unfortunately he has suffered complications from chronic steroid use. He is currently being followed by Endocrinology for adrenal insufficiency. He has also been noted to have hepatic steatosis and avascular necrosis of the hips on CT scan. His course has also been complicated by history of diverticulosis related bleeding. Most recently has been using budesonide for flares as needed, and noted to have numerous colon polyps.    SINCE LAST VISIT:   87 year old male here for an acute visit for changes in bowel habits.  See last clinic note dated November 3 for full discussion of his Crohn's disease and polyp history which we have been closely monitoring.  He states a few weeks ago he had developed a few nights worth of nocturnal diarrhea.  At baseline he has a few loose stools per day, takes Imodium usually 2/day which controls this and he functions well.  This past weekend he was at the [employer], states he was eating at the buffet and a lot of food that he normally does not eat.  Friday and Saturday night he developed severe nocturnal diarrhea with accidents in the bed.  He denies any fevers.  He denies any abdominal pains.  His typical Crohn's symptoms are obstructive with nausea vomiting abdominal pain he is not had any of that.  He is feeling really well in regards to his typical symptoms of Crohn's.  He went on a brat diet over the weekend and early this week and states he is back to his normal state  no further nocturnal stooling.  He is feeling well.  He blames the nocturnal stools on dietary indiscretion.  Of note he recently developed a tooth infection and was placed on amoxicillin yesterday.  He has not noted any worsening bowel habits since that to happen.  He has not been on budesonide at all recently.  Denies any sick contacts.  He submitted a GI pathogen panel on Monday but that is not back yet.    87 year old male here for follow-up visit for his history of colon polyps and Crohn's disease.  I last saw him in March 2022 for surveillance colonoscopy.  Recall that prior to this exam, he had 3 colonoscopies with me in 2021 for numerous large colon polyps in the setting of fair bowel preps, polyps near or at the surgical anastomosis and very challenging to remove.  His exam in March was remarkable for some mild active ileitis.  Biopsies around his surgical anastomosis were prior polyps have been removed did not show any precancerous change which is good.  He did have some recurrent polyp in the ascending colon distal to the anastomosis was removed.    Prior evaluation: Colonoscopy 12/2015 - [name] - normal colon, patent ileocolonic anastomosis, active Crohn's ileitis   Colonoscopy 01/24/2019  - Preparation of the colon was fair. - Crohn's disease with mild ileitis. - Patent end-to-side ileo-colonic anastomosis, characterized by mild stenosis. Biopsied. - Abnormal mucosa in the ascending colon. Biopsied. - Two 3 to 6  mm polyps in the ascending colon, removed with a cold snare. Resected and retrieved. - Eight 4 to 12 mm polyps in the transverse colon, removed with a cold snare. Resected and retrieved. - One large polyp in the transverse colon. Tattooed. Not removed as outlined above - One 10 mm polyp at the splenic flexure, removed with a cold snare. Resected and retrieved. - One 10 mm polyp in the descending colon, removed with a cold snare. Resected and retrieved. - Five 5 to 10 mm  polyps in the sigmoid colon, removed with a cold snare. Resected and retrieved. - Two 4 mm polyps polyps at the recto-sigmoid colon, removed with a cold snare. Resected and retrieved. - Diverticulosis in the entire examined colon. - Stool in the entire examined colon. - Colonic spasm. - The examination was otherwise normal.   Most polyps adenomatous Biopsies of right sided abnormality adenomatous   Colonoscopy 03/25/19  - Preparation of the colon was fair with significant spasm which prolonged this exam - Active ileal Crohn's disease as described - [name] i2. - Patent end-to-side ileo-colonic anastomosis, characterized by inflammation. - Numerous flat polyps noted in the ascending colon and proximal transverse colon as noted. Several minutes spent lavaging the colon and methylene blue used to evaluate the right colon to better delineate some of these especially the largest lesion which may extend into the surgical anastomosis. Largest transverse polyp removed as outlined, with another large transverse polyp noted today which was not removed given duration of procedure and colonic spasm.   FINAL MICROSCOPIC DIAGNOSIS:   A. COLON, RIGHT, POLYPECTOMY:  - Tubular adenoma (x2 fragments).  - No high grade dysplasia or malignancy.   B. COLON, RIGHT NEAR ANASTAMOSIS, POLYPECTOMY:  - Tubular adenoma (multiple fragments).  - No high grade dysplasia or malignancy.   C. COLON, PROXIMAL TRANSVERSE, POLYPECTOMY:  - Tubular adenoma (multiple fragments).  - No high grade dysplasia or malignancy.   D. COLON, TRANSVERSE, LARGE, POLYPECTOMY:  - Tubular adenoma (multiple fragments).  - No high grade dysplasia or malignancy.      Colonoscopy 08/26/19 - Crohn's disease with mildly active ileitis. - Patent end-to-side ileo-colonic anastomosis. - Prior polypectomy site in the ascending colon, unclear if residual polyp vs. normal variant ileal tissue given location at the anatomosis. Area  removed piecemeal using a cold snare. Resected and retrieved. - Chromoendoscopy applied to the proximal transverse and right colon. - Two 5 to 20 mm polyps in the transverse colon, removed piecemeal using a cold snare. Resected and retrieved. - Diverticulosis in the transverse colon and in the left colon. - The examination was otherwise normal. - Spatic colon treated with glucagon. Overall, very challenging exam, time needed to clear the colon, visualization difficult given spasm. I think the colon at this point has been cleared of all high risk lesions however difficult to say if there is residual / recurrence at the polyp site near the anastomosis. Under normal circumstances surgery would be recommended to remove right colon and anastomosis however given patient's age he has elected for colonoscopy surveillance.    FINAL MICROSCOPIC DIAGNOSIS:   A. COLON, RIGHT, SNARE POLYPECTOMY:  - Tubular adenoma(s)  - Negative for high-grade dysplasia or malignancy   B. COLON, TRANSVERSE, SNARE POLYPECTOMY:  - Tubular adenoma(s)  - Negative for high-grade dysplasia or malignancy   C. COLON, TRANSVERSE, SNARE POLYPECTOMY:  - Tubular adenoma(s)  - Negative for high-grade dysplasia or malignancy     Colonoscopy 03/19/20 - - Crohn's disease with mildly active  ileitis. No overt polypoid tissue at the entrance of the ileum but multiple biopsies taken. - Patent end-to-end ileo-colonic anastomosis. - ? polypoid lesion in the ascending colon just distal to the anastomosis, in close approximation, vs. scar tissue or normal variant as above, very difficult to see clear borders. Area was removed with cold snare, - Diverticulosis in the transverse colon and in the left colon. - The examination was otherwise normal. No high risk lesions otherwise appreciated.   FINAL MICROSCOPIC DIAGNOSIS:   A. COLON, ASCENDING, POLYPECTOMY:  - Fragments of polypoid colonic mucosa showing low-grade dysplasia with  a  tubular architecture.  See comment  - Inflammatory polyp  - Other fragments of polypoid colonic mucosa with no specific  histopathologic changes   B. SURGICAL ANASTAMOSIS, BIOPSY:  - Colonic mucosa with nonspecific inflammatory and architectural  changes, consistent with anastomotic site  - Negative for granulomas or dysplasia    Echo 09/23/20 - EF 55-60%, grade I DD     Past Medical History:  Diagnosis Date   Adrenal insufficiency (HCC)    Anemia    Atrial fibrillation (HCC)    Avascular necrosis (HCC)    Clavicle fracture 11/02/2017   Colon polyp    Colon polyps    Crohn's disease ([place])    Glaucoma    Inguinal hernia recurrent bilateral    Low testosterone    Osteopenia    Retinal ischemia      Past Surgical History:  Procedure Laterality Date   ABDOMINAL SURGERY     BIOPSY  03/19/2020   Procedure: BIOPSY;  Surgeon: Yetta Flock, MD;  Location: [facility];  Service: Gastroenterology;;   COLONOSCOPY N/A 12/13/2015   Procedure: COLONOSCOPY;  Surgeon: Laurence Spates, MD;  Location: [facility];  Service: Endoscopy;  Laterality: N/A;   COLONOSCOPY WITH PROPOFOL N/A 03/25/2019   Procedure: COLONOSCOPY WITH PROPOFOL;  Surgeon: Yetta Flock, MD;  Location: [facility];  Service: Gastroenterology;  Laterality: N/A;   COLONOSCOPY WITH PROPOFOL N/A 08/26/2019   Procedure: COLONOSCOPY WITH PROPOFOL;  Surgeon: Yetta Flock, MD;  Location: [facility];  Service: Gastroenterology;  Laterality: N/A;   COLONOSCOPY WITH PROPOFOL N/A 03/19/2020   Procedure: COLONOSCOPY WITH PROPOFOL;  Surgeon: Yetta Flock, MD;  Location: [facility];  Service: Gastroenterology;  Laterality: N/A;   ENDOSCOPIC MUCOSAL RESECTION N/A 08/26/2019   Procedure: ENDOSCOPIC MUCOSAL RESECTION;  Surgeon: Yetta Flock, MD;  Location: [facility];  Service: Gastroenterology;  Laterality: N/A;   INGUINAL HERNIA REPAIR Bilateral 05/21/2014   Procedure: OPEN BILATERAL INGUINAL  HERNIA REPAIRS WITH MESH;  Surgeon: Donnie Mesa, MD;  Location: [facility];  Service: General;  Laterality: Bilateral;   POLYPECTOMY  03/25/2019   Procedure: POLYPECTOMY;  Surgeon: Yetta Flock, MD;  Location: [facility];  Service: Gastroenterology;;   POLYPECTOMY  08/26/2019   Procedure: POLYPECTOMY;  Surgeon: Yetta Flock, MD;  Location: [facility];  Service: Gastroenterology;;   POLYPECTOMY  03/19/2020   Procedure: POLYPECTOMY;  Surgeon: Yetta Flock, MD;  Location: [facility];  Service: Gastroenterology;;   Woodward Ku  08/26/2019   Procedure: Sprayed methylene blue;  Surgeon: Yetta Flock, MD;  Location: [facility];  Service: Gastroenterology;;   SMALL INTESTINE SURGERY  1968, 2001    related to Chrohn's disease   TONSILLECTOMY     Family History  Problem Relation Age of Onset   Deep vein thrombosis Mother    Transient ischemic attack Mother    Heart disease Father  Cancer Maternal Aunt        unknown type; dx after 22   Cancer Cousin        maternal cousin; unknown type   Cancer Cousin        paternal cousin; unknown type; dx after 36   Stomach cancer Neg Hx    Colon cancer Neg Hx    Esophageal cancer Neg Hx    Pancreatic cancer Neg Hx    Rectal cancer Neg Hx    Colon polyps Neg Hx    Social History   Tobacco Use   Smoking status: Former    Types: Cigarettes    Quit date: 04/01/1976    Years since quitting: 44.7   Smokeless tobacco: Never  Vaping Use   Vaping Use: Never used  Substance Use Topics   Alcohol use: Yes    Alcohol/week: 2.0 standard drinks    Types: 2 Glasses of wine per week    Comment: social   Drug use: No   Current Outpatient Medications  Medication Sig Dispense Refill   acetaminophen (TYLENOL) 500 MG tablet Take 500-1,000 mg by mouth every 8 (eight) hours as needed for mild pain or headache.      amoxicillin (AMOXIL) 500 MG capsule Take 500 mg by mouth 3 (three) times daily.      apixaban (ELIQUIS) 5 MG TABS tablet Take 1 tablet (5 mg total) by mouth 2 (two) times daily. 28 tablet 0   Ascorbic Acid (VITAMIN C) 1000 MG tablet Take 1,000 mg by mouth daily with breakfast.      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      brimonidine (ALPHAGAN) 0.2 % ophthalmic solution Place 1 drop into the left eye in the morning and at bedtime. Instill 1 drop into the left eye twice a day- 6:45 AM and 5:50 PM     calcium carbonate (CALCIUM 600) 600 MG TABS tablet Take 1 tablet (600 mg total) by mouth daily. 30 tablet    Cholecalciferol (VITAMIN D3) 50 MCG (2000 UT) TABS Take 2,000 Units by mouth daily with breakfast.     cyanocobalamin (,VITAMIN B-12,) 1000 MCG/ML injection INJECT 1 ML INTO THE MUSCLE EVERY 30 DAYS (Patient taking differently: Inject 1,000 mcg into the muscle every 30 (thirty) days. INJECT 1 ML INTO THE MUSCLE EVERY 30 DAYS) 10 mL 1   desmopressin (DDAVP) 0.1 MG tablet Take 0.1 mg by mouth at bedtime.     dorzolamide-timolol (COSOPT) 22.3-6.8 MG/ML ophthalmic solution Place 1 drop into the left eye 2 (two) times daily. Instill 1 drop into the left eye twice a day- 6:35 AM and 5:30 PM     ferrous sulfate 325 (65 FE) MG tablet Take 325 mg by mouth 3 (three) times a week. In the morning     latanoprost (XALATAN) 0.005 % ophthalmic solution Place 1 drop into the left eye at bedtime. Instill 1 drop into the left eye at 6:10 PM daily     loperamide (IMODIUM A-D) 2 MG tablet Take 2 mg by mouth daily.     metoprolol succinate (TOPROL-XL) 25 MG 24 hr tablet Take one-half tablet by  mouth at bedtime 45 tablet 3   Netarsudil Dimesylate 0.02 % SOLN Place 1 drop into the left eye at bedtime. Instill 1 drop into the left eye at 6:15 PM daily     omeprazole (PRILOSEC OTC) 20 MG tablet Take 20 mg by mouth  daily before breakfast.     Syringe/Needle, Disp, (SYRINGE 3CC/25GX1") 25G X 1" 3 ML  MISC Use for monthly B12 injections 12 each 0   tamsulosin (FLOMAX) 0.4 MG CAPS capsule Take 0.4 mg by mouth at bedtime.     No current facility-administered medications for this visit.   No Known Allergies   Review of Systems: All systems reviewed and negative except where noted in HPI.   Lab Results  Component Value Date   WBC 7.3 09/29/2020   HGB 12.5 (L) 09/29/2020   HCT 36.4 (L) 09/29/2020   MCV 93.3 09/29/2020   PLT 186.0 09/29/2020     Physical Exam: BP 108/64    Pulse 66    Ht 5' 11"  (1.803 m)    Wt 179 lb 2 oz (81.3 kg)    SpO2 100%    BMI 24.98 kg/m  Constitutional: Pleasant,well-developed, male in no acute distress. Abdominal: Soft, nondistended, nontender. There are no masses palpable.  Extremities: no edema Neurological: Alert and oriented to person place and time. Psychiatric: Normal mood and affect. Behavior is normal.   ASSESSMENT AND PLAN: 87 year old male here for reassessment of following:  Change in bowel habits Crohn's disease History of colon polyps  Acute visit today for change in bowel habits as described above.  Patient's index Crohn's symptoms are not bothering him at this time, no pain or obstructive symptoms which is good.  Only nocturnal loose stools which he attributes to dietary indiscretion while traveling.  On brat diet recently and his symptoms have resolved and now back to baseline.  Denies any sick contacts.  Certainly dietary indiscretion could have caused this and he will slowly go back to his normal diet as he tolerates.  He has already submitted a GI pathogen panel to rule out infection, which would seem less likely at this time but will make sure of that.  Confounding his course recently is the addition of amoxicillin for a tooth infection which he will be on for the next week, but started this after these symptoms occurred and resolved.  Counseled him this could cause loose stools as well and he will monitor.  If GI pathogen panel is  negative and his symptoms recur he can take an extra Imodium nightly.  If he notes persistent loose stools during the day and night we may need to consider further evaluation for Crohn's disease but seems less likely at this time.  I will see him again in the spring for reassessment.  I have had a lengthy discussion with him back in November about his Crohn's disease and colon polyps, please refer to note on November 3 for details of those issues.  He agreed with the plan.   Jolly Mango, MD [facility]

## 2020-12-31 NOTE — Patient Instructions (Signed)
If you are age 85 or older, your body mass index should be between 23-30. Your Body mass index is 24.98 kg/m. If this is out of the aforementioned range listed, please consider follow up with your Primary Care Provider. ________________________________________________________  The [facility] providers would like to encourage you to use [facility] to communicate with providers for non-urgent requests or questions.  Due to long hold times on the telephone, sending your provider a message by [facility] may be a faster and more efficient way to get a response.  Please allow 48 business hours for a response.  Please remember that this is for non-urgent requests.  _______________________________________________________  We Will await GI pathogen panel results.  Thank you for entrusting me with your care and choosing [facility].  Dr Havery Moros

## 2021-01-13 ENCOUNTER — Encounter: Payer: Self-pay | Admitting: Internal Medicine

## 2021-01-13 ENCOUNTER — Other Ambulatory Visit: Payer: Self-pay | Admitting: Internal Medicine

## 2021-01-13 ENCOUNTER — Telehealth: Payer: Self-pay | Admitting: Plastic Surgery

## 2021-01-13 NOTE — Telephone Encounter (Signed)
Patient's wife called and stated that patient has taken a fall and scraped up his knee. She states that it is very deep and would like someone to take a look at it. Patient's wife was recommended to take patient to primary care in the meantime, while we communicate with clinical staff.   Patient mentioned that "Jimmy's Miracle Powder" was used on the previous wound that he was seen for by Dr. Marla Roe and it really improved healing process. They were wondering if Dr. Marla Roe had any suggestions for them on how to care for the wound.  They would like to be seen by Dr. Marla Roe but in the mean time they will try to get in with primary care. Please follow up with patient. Wife would be easiest to reach.

## 2021-01-13 NOTE — Progress Notes (Signed)
Subjective:    Patient ID: Michael [name], male    DOB: 1932-12-16, 88 y.o.   MRN: [id number]   This visit occurred during the SARS-CoV-2 public health emergency.  Safety protocols were in place, including screening questions prior to the visit, additional usage of staff PPE, and extensive cleaning of exam room while observing appropriate contact time as indicated for disinfecting solutions.   HPI He is here for a physical exam.  He is here today with his wife.   09/23/20 - got up and could not walk straight - ? TIA at that time.    A few days fell - woke up and got out of bed and kept walking to the left.  He fell. He hit his knee and suffered several skin tears on both arms.  He kept veering left and then scooted on his butt to the bathroom.  He was completely coherent - no confusion.  Was also having some sciatica pain earlier that day - he mentioned earlier that his left leg was weak.  He did PT x 4-5 weeks last fall - it did help, but only transiently.  He is not doing any PT exercises now.  The pain is in his left butt and radiates up.    His left knee jerks since the fall.  He has left knee swelling.    He needs a new walker.  He is using a very old walker that is warn.  Medications and allergies reviewed with patient and updated if appropriate.  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      amoxicillin (AMOXIL) 500 MG capsule Take 500 mg by mouth 3 (three) times daily.     apixaban (ELIQUIS) 5 MG TABS tablet Take 1 tablet (5 mg total) by mouth 2 (two) times daily. 28 tablet 0   Ascorbic Acid (VITAMIN C) 1000 MG tablet Take 1,000 mg by mouth daily with breakfast.      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      brimonidine (ALPHAGAN) 0.2 % ophthalmic solution Place 1 drop into the left eye in the morning and at bedtime. Instill 1 drop into the left eye twice a day- 6:45 AM and 5:50 PM     calcium carbonate (CALCIUM 600) 600 MG TABS  tablet Take 1 tablet (600 mg total) by mouth daily. 30 tablet    Cholecalciferol (VITAMIN D3) 50 MCG (2000 UT) TABS Take 2,000 Units by mouth daily with breakfast.     cyanocobalamin (,VITAMIN B-12,) 1000 MCG/ML injection INJECT 1 ML INTO THE MUSCLE EVERY 30 DAYS (Patient taking differently: Inject 1,000 mcg into the muscle every 30 (thirty) days. INJECT 1 ML INTO THE MUSCLE EVERY 30 DAYS) 10 mL 1   desmopressin (DDAVP) 0.1 MG tablet Take 0.1 mg by mouth at bedtime.     dorzolamide-timolol (COSOPT) 22.3-6.8 MG/ML ophthalmic solution Place 1 drop into the left eye 2 (two) times daily. Instill 1 drop into the left eye twice a day- 6:35 AM and 5:30 PM     ferrous sulfate 325 (65 FE) MG tablet Take 325 mg by mouth 3 (three) times a  week. In the morning     latanoprost (XALATAN) 0.005 % ophthalmic solution Place 1 drop into the left eye at bedtime. Instill 1 drop into the left eye at 6:10 PM daily     loperamide (IMODIUM A-D) 2 MG tablet Take 2 mg by mouth daily.     metoprolol succinate (TOPROL-XL) 25 MG 24 hr tablet Take one-half tablet by  mouth at bedtime 45 tablet 3   Netarsudil Dimesylate 0.02 % SOLN Place 1 drop into the left eye at bedtime. Instill 1 drop into the left eye at 6:15 PM daily     omeprazole (PRILOSEC OTC) 20 MG tablet Take 20 mg by mouth daily before breakfast.     Syringe/Needle, Disp, (SYRINGE 3CC/25GX1") 25G X 1" 3 ML MISC Use for monthly B12 injections 12 each 0   tamsulosin (FLOMAX) 0.4 MG CAPS capsule Take 0.4 mg by mouth at bedtime.     No current facility-administered medications on file prior to visit.    Past Medical History:  Diagnosis Date   Adrenal insufficiency (HCC)    Anemia    Atrial fibrillation (HCC)    Avascular necrosis (HCC)    Clavicle fracture 11/02/2017   Colon polyp    Colon polyps    Crohn's disease ([place])    Glaucoma    Inguinal hernia recurrent bilateral    Low testosterone    Osteopenia    Retinal ischemia     Past Surgical History:  Procedure Laterality Date   ABDOMINAL SURGERY     BIOPSY  03/19/2020   Procedure: BIOPSY;  Surgeon: Yetta Flock, MD;  Location: [facility];  Service: Gastroenterology;;   COLONOSCOPY N/A 12/13/2015   Procedure: COLONOSCOPY;  Surgeon: Laurence Spates, MD;  Location: [facility];  Service: Endoscopy;  Laterality: N/A;   COLONOSCOPY WITH PROPOFOL N/A 03/25/2019   Procedure: COLONOSCOPY WITH PROPOFOL;  Surgeon: Yetta Flock, MD;  Location: [facility];  Service: Gastroenterology;  Laterality: N/A;   COLONOSCOPY WITH PROPOFOL N/A 08/26/2019   Procedure: COLONOSCOPY WITH PROPOFOL;  Surgeon: Yetta Flock, MD;  Location: [facility];  Service: Gastroenterology;  Laterality: N/A;   COLONOSCOPY WITH PROPOFOL N/A  03/19/2020   Procedure: COLONOSCOPY WITH PROPOFOL;  Surgeon: Yetta Flock, MD;  Location: [facility];  Service: Gastroenterology;  Laterality: N/A;   ENDOSCOPIC MUCOSAL RESECTION N/A 08/26/2019   Procedure: ENDOSCOPIC MUCOSAL RESECTION;  Surgeon: Yetta Flock, MD;  Location: [facility];  Service: Gastroenterology;  Laterality: N/A;   INGUINAL HERNIA REPAIR Bilateral 05/21/2014   Procedure: OPEN BILATERAL INGUINAL HERNIA REPAIRS WITH MESH;  Surgeon: Donnie Mesa, MD;  Location: [facility];  Service:  General;  Laterality: Bilateral;   POLYPECTOMY  03/25/2019   Procedure: POLYPECTOMY;  Surgeon: Yetta Flock, MD;  Location: [facility];  Service: Gastroenterology;;   POLYPECTOMY  08/26/2019   Procedure: POLYPECTOMY;  Surgeon: Yetta Flock, MD;  Location: [facility];  Service: Gastroenterology;;   POLYPECTOMY  03/19/2020   Procedure: POLYPECTOMY;  Surgeon: Yetta Flock, MD;  Location: [facility];  Service: Gastroenterology;;   [place] Ku  08/26/2019   Procedure: Sprayed methylene blue;  Surgeon: Yetta Flock, MD;  Location: [facility];  Service: Gastroenterology;;   SMALL INTESTINE SURGERY  1968, 2001    related to Chrohn's disease   TONSILLECTOMY      Social History   Socioeconomic History   Marital status: Married    Spouse name: Not on file   Number of children: 3   Years of education: Not on file   Highest education level: Not on file  Occupational History   Occupation: Retired  Tobacco Use   Smoking status: Former    Types: Cigarettes    Quit date: 04/01/1976    Years since quitting: 44.8   Smokeless tobacco: Never  Vaping Use   Vaping Use: Never used  Substance and Sexual Activity   Alcohol use: Yes    Alcohol/week: 2.0 standard drinks    Types: 2 Glasses of wine per week    Comment: social   Drug use: No   Sexual activity: Never  Other Topics Concern   Not on file  Social History Narrative   Not on  file   Social Determinants of Health   Financial Resource Strain: Not on file  Food Insecurity: Not on file  Transportation Needs: Not on file  Physical Activity: Not on file  Stress: Not on file  Social Connections: Not on file    Family History  Problem Relation Age of Onset   Deep vein thrombosis Mother    Transient ischemic attack Mother    Heart disease Father    Cancer Maternal Aunt        unknown type; dx after 4   Cancer Cousin        maternal cousin; unknown type   Cancer Cousin        paternal cousin; unknown type; dx after 26   Stomach cancer Neg Hx    Colon cancer Neg Hx    Esophageal cancer Neg Hx    Pancreatic cancer Neg Hx    Rectal cancer Neg Hx    Colon polyps Neg Hx     Review of Systems  Constitutional:  Negative for chills and fever.  Eyes:  Negative for visual disturbance.  Respiratory:  Negative for cough, shortness of breath and wheezing.   Cardiovascular:  Negative for chest pain, palpitations and leg swelling.  Gastrointestinal:  Negative for abdominal pain, blood in stool, constipation, diarrhea and nausea.       No gerd  Genitourinary:  Negative for dysuria and hematuria.  Musculoskeletal:  Positive for back pain.  Skin:  Positive for wound.  Neurological:  Negative for dizziness, light-headedness and headaches.  Psychiatric/Behavioral:  Negative for dysphoric mood. The patient is not nervous/anxious.       Objective:   Vitals:   01/14/21 1032  BP: 126/80  Pulse: 89  Temp: 98 F (36.7 C)  SpO2: 99%   Filed Weights   01/14/21 1032  Weight: 178 lb (80.7 kg)   Body mass index is 24.83 kg/m.  BP Readings from Last 3 Encounters:  01/14/21  126/80  12/31/20 108/64  11/18/20 112/80    Wt Readings from Last 3 Encounters:  01/14/21 178 lb (80.7 kg)  12/31/20 179 lb 2 oz (81.3 kg)  11/18/20 181 lb 9.6 oz (82.4 kg)     Physical Exam Constitutional: He appears well-developed and well-nourished. No distress.  HENT:  Head:  Normocephalic and atraumatic.  Right Ear: External ear normal.  Left Ear: External ear normal.  Mouth/Throat: Oropharynx is clear and moist.  Normal ear canals and TM b/l  Eyes: Conjunctivae and EOM are normal.  Neck: Neck supple. No tracheal deviation present. No thyromegaly present.  No carotid bruit  Cardiovascular: Normal rate, regular rhythm, normal heart sounds and intact distal pulses.   No murmur heard. Pulmonary/Chest: Effort normal and breath sounds normal. No respiratory distress. He has no wheezes. He has no rales.  Abdominal: Soft. He exhibits no distension. There is no tenderness.  Genitourinary: deferred  Musculoskeletal: He exhibits no edema.  Lymphadenopathy:   He has no cervical adenopathy.  Skin: Skin is warm and dry. He is not diaphoretic. Skin tear right knee- bandaged, right wrist, right fifth base of finger and left arm.  Skin tear anterior left knee - no active bleeding or evidence of infection Psychiatric: He has a normal mood and affect. His behavior is normal.         Assessment & Plan:   Physical exam: Screening blood work  ordered Exercise   walks a lot  Weight  normal Substance abuse   none   Reviewed recommended immunizations.   Health Maintenance  Topic Date Due   Zoster Vaccines- Shingrix (2 of 2) 01/23/2017   COVID-19 Vaccine (4 - Booster for Pfizer series) 12/02/2019   DEXA SCAN  05/09/2022   TETANUS/TDAP  06/26/2030   Pneumonia Vaccine 32+ Years old  Completed   INFLUENZA VACCINE  Completed   HPV VACCINES  Aged Out     See Problem List for Assessment and Plan of chronic medical problems.

## 2021-01-13 NOTE — Patient Instructions (Addendum)
Blood work and knee xray were ordered.     Medications changes include :   none    Please followup in 6 months    Health Maintenance, Male Adopting a healthy lifestyle and getting preventive care are important in promoting health and wellness. Ask your health care provider about: The right schedule for you to have regular tests and exams. Things you can do on your own to prevent diseases and keep yourself healthy. What should I know about diet, weight, and exercise? Eat a healthy diet  Eat a diet that includes plenty of vegetables, fruits, low-fat dairy products, and lean protein. Do not eat a lot of foods that are high in solid fats, added sugars, or sodium. Maintain a healthy weight Body mass index (BMI) is a measurement that can be used to identify possible weight problems. It estimates body fat based on height and weight. Your health care provider can help determine your BMI and help you achieve or maintain a healthy weight. Get regular exercise Get regular exercise. This is one of the most important things you can do for your health. Most adults should: Exercise for at least 150 minutes each week. The exercise should increase your heart rate and make you sweat (moderate-intensity exercise). Do strengthening exercises at least twice a week. This is in addition to the moderate-intensity exercise. Spend less time sitting. Even light physical activity can be beneficial. Watch cholesterol and blood lipids Have your blood tested for lipids and cholesterol at 86 years of age, then have this test every 5 years. You may need to have your cholesterol levels checked more often if: Your lipid or cholesterol levels are high. You are older than 86 years of age. You are at high risk for heart disease. What should I know about cancer screening? Many types of cancers can be detected early and may often be prevented. Depending on your health history and family history, you may need to have  cancer screening at various ages. This may include screening for: Colorectal cancer. Prostate cancer. Skin cancer. Lung cancer. What should I know about heart disease, diabetes, and high blood pressure? Blood pressure and heart disease High blood pressure causes heart disease and increases the risk of stroke. This is more likely to develop in people who have high blood pressure readings or are overweight. Talk with your health care provider about your target blood pressure readings. Have your blood pressure checked: Every 3-5 years if you are 44-5 years of age. Every year if you are 75 years old or older. If you are between the ages of 64 and 14 and are a current or former smoker, ask your health care provider if you should have a one-time screening for abdominal aortic aneurysm (AAA). Diabetes Have regular diabetes screenings. This checks your fasting blood sugar level. Have the screening done: Once every three years after age 4 if you are at a normal weight and have a low risk for diabetes. More often and at a younger age if you are overweight or have a high risk for diabetes. What should I know about preventing infection? Hepatitis B If you have a higher risk for hepatitis B, you should be screened for this virus. Talk with your health care provider to find out if you are at risk for hepatitis B infection. Hepatitis C Blood testing is recommended for: Everyone born from [birth year] through 1963. Anyone with known risk factors for hepatitis C. Sexually transmitted infections (STIs) You should be screened each year  for STIs, including gonorrhea and chlamydia, if: You are sexually active and are younger than 86 years of age. You are older than 86 years of age and your health care provider tells you that you are at risk for this type of infection. Your sexual activity has changed since you were last screened, and you are at increased risk for chlamydia or gonorrhea. Ask your health care  provider if you are at risk. Ask your health care provider about whether you are at high risk for HIV. Your health care provider may recommend a prescription medicine to help prevent HIV infection. If you choose to take medicine to prevent HIV, you should first get tested for HIV. You should then be tested every 3 months for as long as you are taking the medicine. Follow these instructions at home: Alcohol use Do not drink alcohol if your health care provider tells you not to drink. If you drink alcohol: Limit how much you have to 0-2 drinks a day. Know how much alcohol is in your drink. In the [place] one drink equals one 12 oz bottle of beer (355 mL), one 5 oz glass of wine (148 mL), or one 1 oz glass of hard liquor (44 mL). Lifestyle Do not use any products that contain nicotine or tobacco. These products include cigarettes, chewing tobacco, and vaping devices, such as e-cigarettes. If you need help quitting, ask your health care provider. Do not use street drugs. Do not share needles. Ask your health care provider for help if you need support or information about quitting drugs. General instructions Schedule regular health, dental, and eye exams. Stay current with your vaccines. Tell your health care provider if: You often feel depressed. You have ever been abused or do not feel safe at home. Summary Adopting a healthy lifestyle and getting preventive care are important in promoting health and wellness. Follow your health care provider's instructions about healthy diet, exercising, and getting tested or screened for diseases. Follow your health care provider's instructions on monitoring your cholesterol and blood pressure. This information is not intended to replace advice given to you by your health care provider. Make sure you discuss any questions you have with your health care provider. Document Revised: 05/18/2020 Document Reviewed: 05/18/2020 Elsevier Patient Education  [place].

## 2021-01-14 ENCOUNTER — Ambulatory Visit (INDEPENDENT_AMBULATORY_CARE_PROVIDER_SITE_OTHER): Payer: Medicare Other

## 2021-01-14 ENCOUNTER — Ambulatory Visit (INDEPENDENT_AMBULATORY_CARE_PROVIDER_SITE_OTHER): Payer: Medicare Other | Admitting: Internal Medicine

## 2021-01-14 ENCOUNTER — Other Ambulatory Visit: Payer: Self-pay

## 2021-01-14 VITALS — BP 126/80 | HR 89 | Temp 98.0°F | Ht 71.0 in | Wt 178.0 lb

## 2021-01-14 DIAGNOSIS — R2689 Other abnormalities of gait and mobility: Secondary | ICD-10-CM

## 2021-01-14 DIAGNOSIS — I700 Atherosclerosis of aorta: Secondary | ICD-10-CM

## 2021-01-14 DIAGNOSIS — D509 Iron deficiency anemia, unspecified: Secondary | ICD-10-CM | POA: Diagnosis not present

## 2021-01-14 DIAGNOSIS — E538 Deficiency of other specified B group vitamins: Secondary | ICD-10-CM | POA: Diagnosis not present

## 2021-01-14 DIAGNOSIS — M25562 Pain in left knee: Secondary | ICD-10-CM | POA: Diagnosis not present

## 2021-01-14 DIAGNOSIS — N3281 Overactive bladder: Secondary | ICD-10-CM | POA: Diagnosis not present

## 2021-01-14 DIAGNOSIS — E559 Vitamin D deficiency, unspecified: Secondary | ICD-10-CM

## 2021-01-14 DIAGNOSIS — M85851 Other specified disorders of bone density and structure, right thigh: Secondary | ICD-10-CM | POA: Diagnosis not present

## 2021-01-14 DIAGNOSIS — S81812A Laceration without foreign body, left lower leg, initial encounter: Secondary | ICD-10-CM | POA: Insufficient documentation

## 2021-01-14 DIAGNOSIS — M85852 Other specified disorders of bone density and structure, left thigh: Secondary | ICD-10-CM

## 2021-01-14 DIAGNOSIS — R739 Hyperglycemia, unspecified: Secondary | ICD-10-CM | POA: Diagnosis not present

## 2021-01-14 DIAGNOSIS — M545 Low back pain, unspecified: Secondary | ICD-10-CM

## 2021-01-14 DIAGNOSIS — Z0000 Encounter for general adult medical examination without abnormal findings: Secondary | ICD-10-CM | POA: Diagnosis not present

## 2021-01-14 DIAGNOSIS — S41111A Laceration without foreign body of right upper arm, initial encounter: Secondary | ICD-10-CM | POA: Insufficient documentation

## 2021-01-14 DIAGNOSIS — M7989 Other specified soft tissue disorders: Secondary | ICD-10-CM | POA: Diagnosis not present

## 2021-01-14 LAB — CBC WITH DIFFERENTIAL/PLATELET
Basophils Absolute: 0.1 10*3/uL (ref 0.0–0.1)
Basophils Relative: 1.1 % (ref 0.0–3.0)
Eosinophils Absolute: 0.3 10*3/uL (ref 0.0–0.7)
Eosinophils Relative: 2.7 % (ref 0.0–5.0)
HCT: 35.4 % — ABNORMAL LOW (ref 39.0–52.0)
Hemoglobin: 12.1 g/dL — ABNORMAL LOW (ref 13.0–17.0)
Lymphocytes Relative: 24.7 % (ref 12.0–46.0)
Lymphs Abs: 2.3 10*3/uL (ref 0.7–4.0)
MCHC: 34.1 g/dL (ref 30.0–36.0)
MCV: 91.7 fl (ref 78.0–100.0)
Monocytes Absolute: 0.9 10*3/uL (ref 0.1–1.0)
Monocytes Relative: 9.5 % (ref 3.0–12.0)
Neutro Abs: 5.7 10*3/uL (ref 1.4–7.7)
Neutrophils Relative %: 62 % (ref 43.0–77.0)
Platelets: 217 10*3/uL (ref 150.0–400.0)
RBC: 3.86 Mil/uL — ABNORMAL LOW (ref 4.22–5.81)
RDW: 14.4 % (ref 11.5–15.5)
WBC: 9.3 10*3/uL (ref 4.0–10.5)

## 2021-01-14 LAB — COMPREHENSIVE METABOLIC PANEL
ALT: 10 U/L (ref 0–53)
AST: 15 U/L (ref 0–37)
Albumin: 4 g/dL (ref 3.5–5.2)
Alkaline Phosphatase: 63 U/L (ref 39–117)
BUN: 11 mg/dL (ref 6–23)
CO2: 28 mEq/L (ref 19–32)
Calcium: 9.4 mg/dL (ref 8.4–10.5)
Chloride: 98 mEq/L (ref 96–112)
Creatinine, Ser: 1 mg/dL (ref 0.40–1.50)
GFR: 67.91 mL/min (ref >60.00)
Glucose, Bld: 93 mg/dL (ref 70–99)
Potassium: 4.5 mEq/L (ref 3.5–5.1)
Sodium: 133 mEq/L — ABNORMAL LOW (ref 135–145)
Total Bilirubin: 1.6 mg/dL — ABNORMAL HIGH (ref 0.2–1.2)
Total Protein: 7.3 g/dL (ref 6.0–8.3)

## 2021-01-14 LAB — HEMOGLOBIN A1C: Hgb A1c MFr Bld: 5.6 % (ref 4.6–6.5)

## 2021-01-14 LAB — VITAMIN B12: Vitamin B-12: >1550 pg/mL — ABNORMAL HIGH (ref 211–911)

## 2021-01-14 LAB — TSH: TSH: 1.85 u[IU]/mL (ref 0.35–5.50)

## 2021-01-14 LAB — IBC PANEL
Iron: 52 ug/dL (ref 42–165)
Saturation Ratios: 15.7 % — ABNORMAL LOW (ref 20.0–50.0)
TIBC: 330.4 ug/dL (ref 250.0–450.0)
Transferrin: 236 mg/dL (ref 212.0–360.0)

## 2021-01-14 LAB — LIPID PANEL
Cholesterol: 114 mg/dL (ref 0–200)
HDL: 33.3 mg/dL — ABNORMAL LOW (ref >39.00)
LDL Cholesterol: 45 mg/dL (ref 0–99)
NonHDL: 81.18
Total CHOL/HDL Ratio: 3
Triglycerides: 182 mg/dL — ABNORMAL HIGH (ref 0.0–149.0)
VLDL: 36.4 mg/dL (ref 0.0–40.0)

## 2021-01-14 LAB — VITAMIN D 25 HYDROXY (VIT D DEFICIENCY, FRACTURES): VITD: 42.86 ng/mL (ref 30.00–100.00)

## 2021-01-14 MED ORDER — METHYLPREDNISOLONE ACETATE 80 MG/ML IJ SUSP
80.0000 mg | Freq: Once | INTRAMUSCULAR | Status: AC
  Administered 2021-01-14: 80 mg via INTRAMUSCULAR

## 2021-01-14 NOTE — Assessment & Plan Note (Signed)
Acute From recent fall Not infected Td up to date Antibacterial ointment and bandage

## 2021-01-14 NOTE — Assessment & Plan Note (Signed)
Chronic dexa up to date Discussed fall prevention Encouraged regular exercise

## 2021-01-14 NOTE — Assessment & Plan Note (Signed)
Chronic Cbc, iron panel

## 2021-01-14 NOTE — Assessment & Plan Note (Signed)
Chronic ?Check B12 level ?

## 2021-01-14 NOTE — Assessment & Plan Note (Addendum)
Chronic With recent fall and h/o falls Worse recently due to sciatica on left and left leg weakness Needs new walker - his walker is very worn and old and needs a new one - dme ordered Discussed fall prevention He deferred PT

## 2021-01-14 NOTE — Assessment & Plan Note (Signed)
Chronic Following with urology On desmopressin nightly which has helped

## 2021-01-14 NOTE — Assessment & Plan Note (Signed)
Chronic Taking vitamin D daily Check vitamin D level

## 2021-01-14 NOTE — Assessment & Plan Note (Signed)
Chronic Check a1c Low sugar / carb diet Stressed regular exercise  

## 2021-01-14 NOTE — Assessment & Plan Note (Signed)
Acute Having left sided sciatica again - this has been associated with left leg weakness which is likely related to his recent fall Has done PT in the past - deferred PT Will start using a walker Depo-medrol 80 mg IM x 1

## 2021-01-14 NOTE — Assessment & Plan Note (Signed)
Chronic LDL at goal w/o medication Encouraged regular exercise

## 2021-01-15 ENCOUNTER — Telehealth: Payer: Self-pay | Admitting: Cardiovascular Disease

## 2021-01-15 ENCOUNTER — Telehealth: Payer: Self-pay

## 2021-01-15 NOTE — Telephone Encounter (Signed)
Pt c/o medication issue:  1. Name of Medication: metoprolol succinate (TOPROL-XL) 25 MG 24 hr tablet and desmopressin (DDAVP) 0.1 MG tablet  2. How are you currently taking this medication (dosage and times per day)? Metoprolol succiante: Take one-half tablet by  mouth at bedtime                Desmpressin: Take 0.1 mg by mouth at bedtime  3. Are you having a reaction (difficulty breathing--STAT)? no  4. What is your medication issue? Patient's wife called stating a couple of nights he had woken up in the middle of the night to use the bathroom and he felt weird and was unstable on his feet, he did have a fall on Wednesday night.  Patient's wife wants to know should he be taking both medications at night. Should the metoprolol succinate be moved to the morning or should they be taken several hours apart.

## 2021-01-15 NOTE — Telephone Encounter (Signed)
Yes pt can move metoprolol to AM for dosing and keep desmopressin at night.

## 2021-01-15 NOTE — Telephone Encounter (Signed)
Will route to PharmD for input.

## 2021-01-15 NOTE — Telephone Encounter (Signed)
Pt wife calling in after seeing recent labs results on  [facility]. She  noticed the results some were high and some low. She want to make you aware that he had a B12 shot the day before. She thinks that was the cause of the elevated B-12 level.   Also Pt wife called Cardiologist McAlhany about the Metoprolol.  She would like a call back at [phone].

## 2021-01-15 NOTE — Telephone Encounter (Signed)
Pt saw PCP yesterday for concerns of poor balance, wobbly, fall earlier this week, did not hit his head.  Gets up urgently to get to the bathroom during the night.   After talking w PCP yesterday his wife wants to know if he can separate metoprolol from desmopressin for overactive bladder.    He seems to be much better during the day time.    I adv it is okay to move metoprolol 12.5 mg to AM and see if this makes a difference for him.  Pt's wife grateful for the assistance.

## 2021-01-18 NOTE — Telephone Encounter (Signed)
Followed up with patient's wife today and all concerns were addressed with my-chart message results.

## 2021-01-18 NOTE — Telephone Encounter (Signed)
Seen by patient Michael [name] on 01/16/2021  4:11 PM

## 2021-03-03 ENCOUNTER — Telehealth: Payer: Self-pay | Admitting: Internal Medicine

## 2021-03-03 DIAGNOSIS — M5416 Radiculopathy, lumbar region: Secondary | ICD-10-CM

## 2021-03-03 NOTE — Telephone Encounter (Signed)
His choice, but 1 of those is the next step.

## 2021-03-03 NOTE — Telephone Encounter (Signed)
Patient spouse calling in  Says patient is still having complications w/ his sciatic nerve & wants a cb from nurse [phone]

## 2021-03-03 NOTE — Telephone Encounter (Signed)
Referral order for physical therapy  She needs to make sure he is taking the Eliquis twice daily every day-needs to be 12 hours apart and he needs to be very strict about that.  If these episodes keep happening he may need to see neurology again

## 2021-03-04 NOTE — Telephone Encounter (Signed)
Can come in for a toradol 60 mg  injection.   New referral ordered

## 2021-03-04 NOTE — Telephone Encounter (Signed)
Patient spouse Suanne Marker calling in again  Wants to know if patient is able to get shot for pain until able to start PT  Also wants to know if referral for PT can be sent to [facility] @ [employer] bc patient is familiar since he has went there before [facility])  Meriel Flavors [phone]

## 2021-03-04 NOTE — Telephone Encounter (Signed)
Spoke with patient's wife today and appointment made for tomorrow for Toradol shot.

## 2021-03-05 ENCOUNTER — Ambulatory Visit (INDEPENDENT_AMBULATORY_CARE_PROVIDER_SITE_OTHER): Payer: Medicare Other

## 2021-03-05 ENCOUNTER — Other Ambulatory Visit: Payer: Self-pay

## 2021-03-05 DIAGNOSIS — M545 Low back pain, unspecified: Secondary | ICD-10-CM | POA: Diagnosis not present

## 2021-03-05 MED ORDER — KETOROLAC TROMETHAMINE 60 MG/2ML IM SOLN
60.0000 mg | Freq: Once | INTRAMUSCULAR | Status: AC
  Administered 2021-03-05: 60 mg via INTRAMUSCULAR

## 2021-03-05 NOTE — Progress Notes (Signed)
Toradol 60 given Please co-sign

## 2021-03-23 DIAGNOSIS — H401133 Primary open-angle glaucoma, bilateral, severe stage: Secondary | ICD-10-CM | POA: Diagnosis not present

## 2021-03-31 ENCOUNTER — Telehealth: Payer: Self-pay

## 2021-03-31 NOTE — Telephone Encounter (Signed)
The dizziness may have been related to skipping the metoprolol in which case is just important to remember it on a daily basis.  Eliquis and metoprolol are important to be consistent with. ? ?Since he already had a full neurological work-up I do not think we need to do further neuro evaluation.  If he continues to have symptoms he should come in to see me. ?

## 2021-03-31 NOTE — Telephone Encounter (Signed)
Pt became very weak and very dizzy and lightheaded while at [facility]. ?Pt wife wants to know if he can be seen here today. I advise pt to go to ER as Dr. Quay Burow first appt is Friday. ? ?Please advise pt cb [phone] ? ?Angela Nevin advised to go to ED ?

## 2021-04-01 NOTE — Telephone Encounter (Signed)
Spoke with wife today and info given. ?

## 2021-05-18 ENCOUNTER — Encounter: Payer: Self-pay | Admitting: Gastroenterology

## 2021-05-18 ENCOUNTER — Ambulatory Visit: Payer: Medicare Other | Admitting: Gastroenterology

## 2021-05-18 VITALS — BP 128/82 | HR 100 | Ht 71.0 in | Wt 177.0 lb

## 2021-05-18 DIAGNOSIS — K50019 Crohn's disease of small intestine with unspecified complications: Secondary | ICD-10-CM

## 2021-05-18 DIAGNOSIS — Z86010 Personal history of colonic polyps: Secondary | ICD-10-CM

## 2021-05-18 DIAGNOSIS — Z7901 Long term (current) use of anticoagulants: Secondary | ICD-10-CM

## 2021-05-18 NOTE — Progress Notes (Signed)
HPI :  88 year old male here for follow-up for Crohn's disease.    Crohn's history: Previously followed by Dr. Danise Edge. 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 He has had 2 operations for his Crohns in 1968 and then again 2001 or so for small bowel resections. He remotely was treated with prednisone as needed in the past. He was never been on any other maintenance therapy for Crohn's disease other than prednisone and surgery. He has had hospitalizations in the past for bowel obstructions. Unfortunately he has suffered complications from chronic steroid use. He is currently being followed by Endocrinology for adrenal insufficiency. He has also been noted to have hepatic steatosis and avascular necrosis of the hips on CT scan. His course has also been complicated by history of diverticulosis related bleeding. Most recently has been using budesonide for flares as needed, and noted to have numerous colon polyps on multiple colonoscopy exams.    SINCE LAST VISIT:   88 year old male here for follow-up for his Crohn's disease.  He was seen acutely in December for acute diarrhea, had a negative GI pathogen panel, symptoms eventually abated with time and conservative measures.  He states he been feeling really well since I last seen him.  He denies any problems with his bowels.  No abdominal pains.  No diarrhea.  No blood in his stools.  He is eating well, although amount he eats is not as much as it used to.  His weight is stable.  He has not had any flares of his Crohn's disease since have seen him.  He does have budesonide at home if needed for flares, he usually contact me if he needs this.  He has not required this at all this year.  Recall he has a history of TIAs, last was in September.  He remains on Eliquis.  We spent most of the visit today discussing if he wanted to have any more surveillance colonoscopies.  I have discussed this with him on a few occasions in the past.  His last  surveillance exam was in March 2022.  He has had about 4 colonoscopies with me since 2021 for numerous large flat colon polyps in his right colon in the setting of fair bowel preps, some of these have been near or at the surgical anastomosis and very challenging to remove.  He has had some mildly active ileitis on these exams.      Prior evaluation: Colonoscopy 12/2015 - [name] - "normal colon", patent ileocolonic anastomosis, active Crohn's ileitis   Colonoscopy 01/24/2019  - Preparation of the colon was fair. - Crohn's disease with mild ileitis. - Patent end-to-side ileo-colonic anastomosis, characterized by mild stenosis. Biopsied. - Abnormal mucosa in the ascending colon. Biopsied. - Two 3 to 6 mm polyps in the ascending colon, removed with a cold snare. Resected and retrieved. - Eight 4 to 12 mm polyps in the transverse colon, removed with a cold snare. Resected and retrieved. - One large polyp in the transverse colon. Tattooed. Not removed as outlined above - One 10 mm polyp at the splenic flexure, removed with a cold snare. Resected and retrieved. - One 10 mm polyp in the descending colon, removed with a cold snare. Resected and retrieved. - Five 5 to 10 mm polyps in the sigmoid colon, removed with a cold snare. Resected and retrieved. - Two 4 mm polyps polyps at the recto-sigmoid colon, removed with a cold snare. Resected and retrieved. - Diverticulosis in the entire examined colon. -  Stool in the entire examined colon. - Colonic spasm. - The examination was otherwise normal.   Most polyps adenomatous Biopsies of right sided abnormality adenomatous   Colonoscopy 03/25/19  - Preparation of the colon was fair with significant spasm which prolonged this exam - Active ileal Crohn's disease as described - [name] i2. - Patent end-to-side ileo-colonic anastomosis, characterized by inflammation. - Numerous flat polyps noted in the ascending colon and proximal transverse colon  as noted. Several minutes spent lavaging the colon and methylene blue used to evaluate the right colon to better delineate some of these especially the largest lesion which may extend into the surgical anastomosis. Largest transverse polyp removed as outlined, with another large transverse polyp noted today which was not removed given duration of procedure and colonic spasm.   FINAL MICROSCOPIC DIAGNOSIS:   A. COLON, RIGHT, POLYPECTOMY:  - Tubular adenoma (x2 fragments).  - No high grade dysplasia or malignancy.   B. COLON, RIGHT NEAR ANASTAMOSIS, POLYPECTOMY:  - Tubular adenoma (multiple fragments).  - No high grade dysplasia or malignancy.   C. COLON, PROXIMAL TRANSVERSE, POLYPECTOMY:  - Tubular adenoma (multiple fragments).  - No high grade dysplasia or malignancy.   D. COLON, TRANSVERSE, LARGE, POLYPECTOMY:  - Tubular adenoma (multiple fragments).  - No high grade dysplasia or malignancy.      Colonoscopy 08/26/19 - Crohn's disease with mildly active ileitis. - Patent end-to-side ileo-colonic anastomosis. - Prior polypectomy site in the ascending colon, unclear if residual polyp vs. normal variant ileal tissue given location at the anatomosis. Area removed piecemeal using a cold snare. Resected and retrieved. - Chromoendoscopy applied to the proximal transverse and right colon. - Two 5 to 20 mm polyps in the transverse colon, removed piecemeal using a cold snare. Resected and retrieved. - Diverticulosis in the transverse colon and in the left colon. - The examination was otherwise normal. - Spatic colon treated with glucagon. Overall, very challenging exam, time needed to clear the colon, visualization difficult given spasm. I think the colon at this point has been cleared of all high risk lesions however difficult to say if there is residual / recurrence at the polyp site near the anastomosis. Under normal circumstances surgery would be recommended to remove right colon  and anastomosis however given patient's age he has elected for colonoscopy surveillance.    FINAL MICROSCOPIC DIAGNOSIS:   A. COLON, RIGHT, SNARE POLYPECTOMY:  - Tubular adenoma(s)  - Negative for high-grade dysplasia or malignancy   B. COLON, TRANSVERSE, SNARE POLYPECTOMY:  - Tubular adenoma(s)  - Negative for high-grade dysplasia or malignancy   C. COLON, TRANSVERSE, SNARE POLYPECTOMY:  - Tubular adenoma(s)  - Negative for high-grade dysplasia or malignancy     Colonoscopy 03/19/20 - - Crohn's disease with mildly active ileitis. No overt polypoid tissue at the entrance of the ileum but multiple biopsies taken. - Patent end-to-end ileo-colonic anastomosis. - ? polypoid lesion in the ascending colon just distal to the anastomosis, in close approximation, vs. scar tissue or normal variant as above, very difficult to see clear borders. Area was removed with cold snare, - Diverticulosis in the transverse colon and in the left colon. - The examination was otherwise normal. No high risk lesions otherwise appreciated.   FINAL MICROSCOPIC DIAGNOSIS:   A. COLON, ASCENDING, POLYPECTOMY:  - Fragments of polypoid colonic mucosa showing low-grade dysplasia with  a tubular architecture.  See comment  - Inflammatory polyp  - Other fragments of polypoid colonic mucosa with no specific  histopathologic changes  B. SURGICAL ANASTAMOSIS, BIOPSY:  - Colonic mucosa with nonspecific inflammatory and architectural  changes, consistent with anastomotic site  - Negative for granulomas or dysplasia    Echo 09/23/20 - EF 55-60%, grade I DD      Lab Results  Component Value Date   WBC 9.3 01/14/2021   HGB 12.1 (L) 01/14/2021   HCT 35.4 (L) 01/14/2021   MCV 91.7 01/14/2021   PLT 217.0 01/14/2021    Lab Results  Component Value Date   IRON 52 01/14/2021   TIBC 330.4 01/14/2021   FERRITIN 101 09/29/2020   Lab Results  Component Value Date   VITAMINB12 >1550 (H) 01/14/2021      Past Medical History:  Diagnosis Date   Adrenal insufficiency (HCC)    Anemia    Atrial fibrillation (HCC)    Avascular necrosis (HCC)    Clavicle fracture 11/02/2017   Colon polyp    Colon polyps    Crohn's disease (HCC)    Glaucoma    Inguinal hernia recurrent bilateral    Low testosterone    Osteopenia    Retinal ischemia      Past Surgical History:  Procedure Laterality Date   ABDOMINAL SURGERY     BIOPSY  03/19/2020   Procedure: BIOPSY;  Surgeon: Benancio Deeds, MD;  Location: [facility];  Service: Gastroenterology;;   COLONOSCOPY N/A 12/13/2015   Procedure: COLONOSCOPY;  Surgeon: Carman Ching, MD;  Location: [facility];  Service: Endoscopy;  Laterality: N/A;   COLONOSCOPY WITH PROPOFOL N/A 03/25/2019   Procedure: COLONOSCOPY WITH PROPOFOL;  Surgeon: Benancio Deeds, MD;  Location: [facility];  Service: Gastroenterology;  Laterality: N/A;   COLONOSCOPY WITH PROPOFOL N/A 08/26/2019   Procedure: COLONOSCOPY WITH PROPOFOL;  Surgeon: Benancio Deeds, MD;  Location: [facility];  Service: Gastroenterology;  Laterality: N/A;   COLONOSCOPY WITH PROPOFOL N/A 03/19/2020   Procedure: COLONOSCOPY WITH PROPOFOL;  Surgeon: Benancio Deeds, MD;  Location: [facility];  Service: Gastroenterology;  Laterality: N/A;   ENDOSCOPIC MUCOSAL RESECTION N/A 08/26/2019   Procedure: ENDOSCOPIC MUCOSAL RESECTION;  Surgeon: Benancio Deeds, MD;  Location: [facility];  Service: Gastroenterology;  Laterality: N/A;   INGUINAL HERNIA REPAIR Bilateral 05/21/2014   Procedure: OPEN BILATERAL INGUINAL HERNIA REPAIRS WITH MESH;  Surgeon: Manus Rudd, MD;  Location: [facility];  Service: General;  Laterality: Bilateral;   POLYPECTOMY  03/25/2019   Procedure: POLYPECTOMY;  Surgeon: Benancio Deeds, MD;  Location: [facility];  Service: Gastroenterology;;   POLYPECTOMY  08/26/2019   Procedure: POLYPECTOMY;  Surgeon: Benancio Deeds, MD;  Location: [facility];  Service: Gastroenterology;;   POLYPECTOMY  03/19/2020   Procedure: POLYPECTOMY;  Surgeon: Benancio Deeds, MD;  Location: [facility];  Service: Gastroenterology;;   Theresia Majors  08/26/2019   Procedure: Sprayed methylene blue;  Surgeon: Benancio Deeds, MD;  Location: [facility];  Service: Gastroenterology;;   SMALL INTESTINE SURGERY  1968, 2001    related to Chrohn's disease   TONSILLECTOMY     Family History  Problem Relation Age of Onset   Deep vein thrombosis Mother    Transient ischemic attack Mother    Heart disease Father    Cancer Maternal Aunt        unknown type; dx after 82   Cancer Cousin        maternal cousin; unknown type   Cancer Cousin        paternal cousin; unknown type; dx after 47   Stomach cancer  Neg Hx    Colon cancer Neg Hx    Esophageal cancer Neg Hx    Pancreatic cancer Neg Hx    Rectal cancer Neg Hx    Colon polyps Neg Hx    Social History   Tobacco Use   Smoking status: Former    Types: Cigarettes    Quit date: 04/01/1976    Years since quitting: 45.1   Smokeless tobacco: Never  Vaping Use   Vaping Use: Never used  Substance Use Topics   Alcohol use: Yes    Alcohol/week: 2.0 standard drinks    Types: 2 Glasses of wine per week    Comment: social   Drug use: No   Current Outpatient Medications  Medication Sig Dispense Refill   acetaminophen (TYLENOL) 500 MG tablet Take 500-1,000 mg by mouth every 8 (eight) hours as needed for mild pain or headache.      amoxicillin (AMOXIL) 500 MG capsule Take 500 mg by mouth 3 (three) times daily.     apixaban (ELIQUIS) 5 MG TABS tablet Take 1 tablet (5 mg total) by mouth 2 (two) times daily. 28 tablet 0   Ascorbic Acid (VITAMIN C) 1000 MG tablet Take 1,000 mg by mouth daily with breakfast.      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      brimonidine (ALPHAGAN) 0.2 %  ophthalmic solution Place 1 drop into the left eye in the morning and at bedtime. Instill 1 drop into the left eye twice a day- 6:45 AM and 5:50 PM     calcium carbonate (CALCIUM 600) 600 MG TABS tablet Take 1 tablet (600 mg total) by mouth daily. 30 tablet    Cholecalciferol (VITAMIN D3) 50 MCG (2000 UT) TABS Take 2,000 Units by mouth daily with breakfast.     cyanocobalamin (,VITAMIN B-12,) 1000 MCG/ML injection INJECT INTO THE MUSCLE ONCE EVERY THIRTY DAYS 10 mL 0   desmopressin (DDAVP) 0.1 MG tablet Take 0.1 mg by mouth at bedtime.     dorzolamide-timolol (COSOPT) 22.3-6.8 MG/ML ophthalmic solution Place 1 drop into the left eye 2 (two) times daily. Instill 1 drop into the left eye twice a day- 6:35 AM and 5:30 PM     ferrous sulfate 325 (65 FE) MG tablet Take 325 mg by mouth 3 (three) times a week. In the morning     latanoprost (XALATAN) 0.005 % ophthalmic solution Place 1 drop into the left eye at bedtime. Instill 1 drop into the left eye at 6:10 PM daily     loperamide (IMODIUM A-D) 2 MG tablet Take 2 mg by mouth daily.     metoprolol succinate (TOPROL-XL) 25 MG 24 hr tablet Take one-half tablet by  mouth at bedtime 45 tablet 3   Netarsudil Dimesylate 0.02 % SOLN Place 1 drop into the left eye at bedtime. Instill 1 drop into the left eye at 6:15 PM daily     omeprazole (PRILOSEC OTC) 20 MG tablet Take 20 mg by mouth daily before breakfast.     Syringe/Needle, Disp, (SYRINGE 3CC/25GX1") 25G X 1" 3 ML MISC Use for monthly B12 injections 12 each 0   tamsulosin (FLOMAX) 0.4 MG CAPS capsule Take 0.4 mg by mouth at bedtime.     No current facility-administered medications for this visit.   No Known Allergies   Review of Systems: All systems reviewed and negative except where noted in HPI.  Labs in HPI as above  Physical Exam: BP 128/82   Pulse 100   Ht 5\' 11"  (1.803 m)   Wt 177 lb (80.3 kg)   SpO2 100%   BMI 24.69 kg/m  Constitutional: Pleasant,well-developed, male in no  acute distress. Abdominal: Soft, nondistended, nontender.  There are no masses palpable.  Neurological: Alert and oriented to person place and time. Psychiatric: Normal mood and affect. Behavior is normal.   ASSESSMENT AND PLAN: 88 year old male here for reassessment following:  History of colon polyps Crohn's disease Anticoagulated / history of TIA  In regards to his Crohn's disease he is clinically doing well.  He has not wanted to have aggressive medical therapy for his Crohn's disease, has declined most Biologics, considered Entyvio but could not afford it.  He has been generally stable since of last seen without any flares, has been using budesonide as needed for mild ileitis.  We will continue the present plan for now, budesonide as needed for symptoms (namely pain is the main symptom he has had from this), currently asymptomatic.  He has a mild anemia which is stable, iron studies and B12 normal.  We spent most of the visit discussing his history of colon polyps which have been extensive over the past 2 years.  Recall I had previously discussed his case at the multidisciplinary conference.  If he was at a younger age without comorbidities we would normally consider surgery to include right hemicolectomy with resection of the anastomosis but given his age and comorbidities, high risk for a major operation and has been avoiding that.  Goal of his care at this point is to prevent colon cancer and keep him out of the hospital with high quality of life.  We have debulked the polyp burden significantly, there is no evidence of colon cancer, he is at risk for this (near the anastomosis which has been difficult to remove the polyps in entirety due to altered anatomy) but hopefully will not occur in his lifetime.  We discussed if he wanted to have another colonoscopy or not.  Given his course over the past year with 2 TIAs, on Eliquis, I think the risks of further colonoscopy and anesthesia probably  outweighs the benefits at this point time.  We had a lengthy discussion about this.  I cannot guarantee he will never get colon cancer but my hope is that the risk is pretty low at this point time.  His risk of recurrent TIA or stroke is not insignificant, he understands this.  Following discussion he is in agreement that we will hold off on surveillance colonoscopy moving forward.  If he has symptoms that bother him or other issues with his bowels he will contact me.  I will continue to see him every 6 months and reassess him.  If he has a change in heart or thinks otherwise in relation to the colonoscopy in the interim he can let me know.  Harlin Rain, MD [facility]

## 2021-05-18 NOTE — Patient Instructions (Addendum)
If you are age 86 or older, your body mass index should be between 23-30. Your Body mass index is 24.69 kg/m?Marland Kitchen If this is out of the aforementioned range listed, please consider follow up with your Primary Care Provider. ? ?If you are age 9 or younger, your body mass index should be between 19-25. Your Body mass index is 24.69 kg/m?Marland Kitchen If this is out of the aformentioned range listed, please consider follow up with your Primary Care Provider.  ? ?________________________________________________________ ? ?The [facility] providers would like to encourage you to use [facility] to communicate with providers for non-urgent requests or questions.  Due to long hold times on the telephone, sending your provider a message by [facility] may be a faster and more efficient way to get a response.  Please allow 48 business hours for a response.  Please remember that this is for non-urgent requests.  ?_______________________________________________________ ? ?Please follow up in 6 months, in November 2023. ? ?Thank you for entrusting me with your care and for choosing [employer], ?Dr. Lewis and Clark Cellar ? ? ?

## 2021-06-24 ENCOUNTER — Encounter (INDEPENDENT_AMBULATORY_CARE_PROVIDER_SITE_OTHER): Payer: Self-pay

## 2021-06-24 DIAGNOSIS — H35351 Cystoid macular degeneration, right eye: Secondary | ICD-10-CM | POA: Diagnosis not present

## 2021-06-24 DIAGNOSIS — H04123 Dry eye syndrome of bilateral lacrimal glands: Secondary | ICD-10-CM | POA: Diagnosis not present

## 2021-06-24 DIAGNOSIS — H353221 Exudative age-related macular degeneration, left eye, with active choroidal neovascularization: Secondary | ICD-10-CM | POA: Diagnosis not present

## 2021-06-24 DIAGNOSIS — H401133 Primary open-angle glaucoma, bilateral, severe stage: Secondary | ICD-10-CM | POA: Diagnosis not present

## 2021-06-24 DIAGNOSIS — H43822 Vitreomacular adhesion, left eye: Secondary | ICD-10-CM | POA: Diagnosis not present

## 2021-06-24 DIAGNOSIS — Z961 Presence of intraocular lens: Secondary | ICD-10-CM | POA: Diagnosis not present

## 2021-06-28 ENCOUNTER — Encounter (INDEPENDENT_AMBULATORY_CARE_PROVIDER_SITE_OTHER): Payer: Medicare Other | Admitting: Ophthalmology

## 2021-07-01 ENCOUNTER — Encounter (INDEPENDENT_AMBULATORY_CARE_PROVIDER_SITE_OTHER): Payer: Self-pay | Admitting: Ophthalmology

## 2021-07-01 ENCOUNTER — Ambulatory Visit (INDEPENDENT_AMBULATORY_CARE_PROVIDER_SITE_OTHER): Payer: Medicare Other | Admitting: Ophthalmology

## 2021-07-01 ENCOUNTER — Encounter (INDEPENDENT_AMBULATORY_CARE_PROVIDER_SITE_OTHER): Payer: Medicare Other | Admitting: Ophthalmology

## 2021-07-01 DIAGNOSIS — H353132 Nonexudative age-related macular degeneration, bilateral, intermediate dry stage: Secondary | ICD-10-CM

## 2021-07-01 DIAGNOSIS — Z961 Presence of intraocular lens: Secondary | ICD-10-CM | POA: Insufficient documentation

## 2021-07-01 DIAGNOSIS — H43822 Vitreomacular adhesion, left eye: Secondary | ICD-10-CM

## 2021-07-01 DIAGNOSIS — H401123 Primary open-angle glaucoma, left eye, severe stage: Secondary | ICD-10-CM | POA: Diagnosis not present

## 2021-07-01 DIAGNOSIS — H401113 Primary open-angle glaucoma, right eye, severe stage: Secondary | ICD-10-CM | POA: Diagnosis not present

## 2021-07-01 NOTE — Assessment & Plan Note (Signed)
Absolute glaucoma OD

## 2021-07-01 NOTE — Assessment & Plan Note (Signed)
OD with intermediate AMD.  Patient being instructed in use of AREDS 2

## 2021-07-01 NOTE — Assessment & Plan Note (Signed)
Advanced glaucoma prior to seeing Dr. Katy Fitch, and progression despite efforts to halt with trabeculectomy under the care of Dr. Katy Fitch

## 2021-07-01 NOTE — Assessment & Plan Note (Signed)
Under the care of currently of Dr. Clent Jacks

## 2021-07-01 NOTE — Assessment & Plan Note (Signed)
Advanced optic nerve cupping, under management with Dr. Katy Fitch

## 2021-07-01 NOTE — Patient Instructions (Signed)
Age-Related Macular Degeneration  Age-related macular degeneration (ARMD) is an eye disease related to aging. The disease is a problem of one of the back layers of the eye (retina) causing a loss of central vision. Central vision allows a person to see objects clearly and do daily tasks like reading and driving. There are two main types of ARMD: Dry ARMD. People with this type generally lose their vision slowly. This is the most common type of ARMD. Some people with dry ARMD notice very little change in their vision as they age. Wet ARMD. People with this type can lose their vision quickly. What are the causes? This condition is caused by damage to the part of the retina that provides you with central vision (macula). Dry ARMD happens when deposits in the macula cause light-sensitive cells to slowly break down. Wet ARMD happens when abnormal blood vessels grow under the macula and leak blood and fluid. What increases the risk? You are more likely to develop this condition if you: Are 11 years old or older, and especially 21 years old or older. Smoke. Are obese. Have a family history of ARMD. Have high cholesterol, high blood pressure, or heart disease. Have been exposed to high levels of ultraviolet (UV) light and blue light. Are white (Caucasian). Are male. What are the signs or symptoms? Common symptoms of this condition include: Blurred vision, especially when reading print material. The blurred vision often improves in brighter light. A blurred or blind spot in the center of your field of vision that is small but growing larger. Bright colors seeming less bright than they used to be. Decreased ability to recognize and see faces. One eye seeing worse than the other. Decreased ability to adapt to dimly lit rooms. Straight lines appearing crooked or wavy. How is this diagnosed? This condition is diagnosed based on your symptoms and an eye exam. During the eye exam: Eye drops will be  placed into your eyes to enlarge (dilate) your pupils. This will allow your health care provider to see the back of your eye. You may be asked to look at an image that looks like a checkerboard (Amsler grid). Early changes in your central vision may cause the grid to appear distorted. During the exam, you may be given one or both of these tests: Intravenous fluorescein angiogram. This test helps determine whether you have dry or wet ARMD. Optical coherence tomography (OCT) test to evaluate deep layers of the retina. How is this treated? There is no cure for this condition, but treatment can help to slow down progression of ARMD. Dry ARMD is often treated with vitamin supplements. Wet ARMD may be treatable with medications placed inside the eye or laser treatments. Treatments include: Supplements, including vitamin C, vitamin E, lutein, zeaxanthin, copper, and zinc. Injections of medicines (including anti-blood vessel growth factors and steroids) into your eye to slow down the formation of abnormal blood vessels that may leak and decrease fluid accumulation. These injections often need to be repeated on a routine basis. Laser surgery to destroy abnormal blood vessels or leaking blood vessels in your eye. Low vision aids may be very helpful if you have poor vision in both eyes from ARMD. Examples of low vision aids include magnifiers, tablet computers, and large-print products. Follow these instructions at home: Do not use any products that contain nicotine or tobacco. These products include cigarettes, chewing tobacco, and vaping devices, such as e-cigarettes. If you need help quitting, ask your health care provider. Take over-the-counter  and prescription medicines only as told by your health care provider. Take vitamins and supplements as told by your health care provider. Ask your health care provider for an Amsler grid. Use it every day to check each eye for vision changes. Get an eye exam as  often as told by your health care provider. Make sure to get an eye exam at least once every year. Keep all follow-up visits. This is important. Where to find support Macular Disease Society: www.macularsociety.[place] [employer]: http://reyes-guerrero.com/ Contact a health care provider if: You notice any changes in your vision. Get help right away if: You suddenly lose vision or develop pain in the eye. Summary Age-related macular degeneration (ARMD) is an eye disease related to aging. There are two types of this condition: dry ARMD and wet ARMD. This condition is caused by damage to the part of the retina that provides you with central vision (macula). While there is no cure for ARMD, treatments can slow down progression. Once diagnosed with ARMD, make sure to get an eye exam every year, take supplements and vitamins as directed, use an Amsler grid at home, and follow up with your health care provider as directed. This information is not intended to replace advice given to you by your health care provider. Make sure you discuss any questions you have with your health care provider. Document Revised: 08/18/2020 Document Reviewed: 08/18/2020 Elsevier Patient Education  [place].

## 2021-07-01 NOTE — Assessment & Plan Note (Signed)
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

## 2021-07-26 ENCOUNTER — Encounter: Payer: Self-pay | Admitting: Internal Medicine

## 2021-07-26 NOTE — Patient Instructions (Addendum)
Blood work was ordered.     Medications changes include :   none    Return in about 1 year (around 07/28/2022) for Physical Exam.   Health Maintenance, Male Adopting a healthy lifestyle and getting preventive care are important in promoting health and wellness. Ask your health care provider about: The right schedule for you to have regular tests and exams. Things you can do on your own to prevent diseases and keep yourself healthy. What should I know about diet, weight, and exercise? Eat a healthy diet  Eat a diet that includes plenty of vegetables, fruits, low-fat dairy products, and lean protein. Do not eat a lot of foods that are high in solid fats, added sugars, or sodium. Maintain a healthy weight Body mass index (BMI) is a measurement that can be used to identify possible weight problems. It estimates body fat based on height and weight. Your health care provider can help determine your BMI and help you achieve or maintain a healthy weight. Get regular exercise Get regular exercise. This is one of the most important things you can do for your health. Most adults should: Exercise for at least 150 minutes each week. The exercise should increase your heart rate and make you sweat (moderate-intensity exercise). Do strengthening exercises at least twice a week. This is in addition to the moderate-intensity exercise. Spend less time sitting. Even light physical activity can be beneficial. Watch cholesterol and blood lipids Have your blood tested for lipids and cholesterol at 86 years of age, then have this test every 5 years. You may need to have your cholesterol levels checked more often if: Your lipid or cholesterol levels are high. You are older than 86 years of age. You are at high risk for heart disease. What should I know about cancer screening? Many types of cancers can be detected early and may often be prevented. Depending on your health history and family history,  you may need to have cancer screening at various ages. This may include screening for: Colorectal cancer. Prostate cancer. Skin cancer. Lung cancer. What should I know about heart disease, diabetes, and high blood pressure? Blood pressure and heart disease High blood pressure causes heart disease and increases the risk of stroke. This is more likely to develop in people who have high blood pressure readings or are overweight. Talk with your health care provider about your target blood pressure readings. Have your blood pressure checked: Every 3-5 years if you are 50-19 years of age. Every year if you are 86 years old or older. If you are between the ages of 62 and 6 and are a current or former smoker, ask your health care provider if you should have a one-time screening for abdominal aortic aneurysm (AAA). Diabetes Have regular diabetes screenings. This checks your fasting blood sugar level. Have the screening done: Once every three years after age 35 if you are at a normal weight and have a low risk for diabetes. More often and at a younger age if you are overweight or have a high risk for diabetes. What should I know about preventing infection? Hepatitis B If you have a higher risk for hepatitis B, you should be screened for this virus. Talk with your health care provider to find out if you are at risk for hepatitis B infection. Hepatitis C Blood testing is recommended for: Everyone born from [birth year] through 1963. Anyone with known risk factors for hepatitis C. Sexually transmitted infections (STIs) You should  be screened each year for STIs, including gonorrhea and chlamydia, if: You are sexually active and are younger than 86 years of age. You are older than 86 years of age and your health care provider tells you that you are at risk for this type of infection. Your sexual activity has changed since you were last screened, and you are at increased risk for chlamydia or gonorrhea. Ask  your health care provider if you are at risk. Ask your health care provider about whether you are at high risk for HIV. Your health care provider may recommend a prescription medicine to help prevent HIV infection. If you choose to take medicine to prevent HIV, you should first get tested for HIV. You should then be tested every 3 months for as long as you are taking the medicine. Follow these instructions at home: Alcohol use Do not drink alcohol if your health care provider tells you not to drink. If you drink alcohol: Limit how much you have to 0-2 drinks a day. Know how much alcohol is in your drink. In the [place] one drink equals one 12 oz bottle of beer (355 mL), one 5 oz glass of wine (148 mL), or one 1 oz glass of hard liquor (44 mL). Lifestyle Do not use any products that contain nicotine or tobacco. These products include cigarettes, chewing tobacco, and vaping devices, such as e-cigarettes. If you need help quitting, ask your health care provider. Do not use street drugs. Do not share needles. Ask your health care provider for help if you need support or information about quitting drugs. General instructions Schedule regular health, dental, and eye exams. Stay current with your vaccines. Tell your health care provider if: You often feel depressed. You have ever been abused or do not feel safe at home. Summary Adopting a healthy lifestyle and getting preventive care are important in promoting health and wellness. Follow your health care provider's instructions about healthy diet, exercising, and getting tested or screened for diseases. Follow your health care provider's instructions on monitoring your cholesterol and blood pressure. This information is not intended to replace advice given to you by your health care provider. Make sure you discuss any questions you have with your health care provider. Document Revised: 05/18/2020 Document Reviewed: 05/18/2020 Elsevier Patient  Education  [place].

## 2021-07-26 NOTE — Progress Notes (Unsigned)
Subjective:    Patient ID: Michael [name], male    DOB: [birth year], 88 y.o.   MRN: [id number]     HPI Shuan is here for a physical exam.   ?  Still on Eliquis-last saw cardiology 10/2020.  Has paroxysmal atrial fibrillation and at that time the plan was to continue beta-blocker and Eliquis.  1 year follow-up was recommended.   Sciatica is gone.  Working with a physical therapist/trainer has really helped.  He is careful with what he does.  Medications and allergies reviewed with patient and updated if appropriate.  Current Outpatient Medications on File Prior to Visit  Medication Sig Dispense Refill   acetaminophen (TYLENOL) 500 MG tablet Take 500-1,000 mg by mouth every 8 (eight) hours as needed for mild pain or headache.      amoxicillin (AMOXIL) 500 MG capsule Take 500 mg by mouth 3 (three) times daily.     apixaban (ELIQUIS) 5 MG TABS tablet Take 1 tablet (5 mg total) by mouth 2 (two) times daily. 28 tablet 0   Ascorbic Acid (VITAMIN C) 1000 MG tablet Take 1,000 mg by mouth daily with breakfast.      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      brimonidine (ALPHAGAN) 0.2 % ophthalmic solution Place 1 drop into the left eye in the morning and at bedtime. Instill 1 drop into the left eye twice a day- 6:45 AM and 5:50 PM     calcium carbonate (CALCIUM 600) 600 MG TABS tablet Take 1 tablet (600 mg total) by mouth daily. 30 tablet    Cholecalciferol (VITAMIN D3) 50 MCG (2000 UT) TABS Take 2,000 Units by mouth daily with breakfast.     cyanocobalamin (,VITAMIN B-12,) 1000 MCG/ML injection INJECT 1ML INTO THE MUSCLE ONCE EVERY THIRTY DAYS 10 mL 0   desmopressin (DDAVP) 0.1 MG tablet Take 0.1 mg by mouth at bedtime.     dorzolamide-timolol (COSOPT) 22.3-6.8 MG/ML ophthalmic solution Place 1 drop into the left eye 2 (two) times daily. Instill 1 drop into the left eye twice a day-  6:35 AM and 5:30 PM     ferrous sulfate 325 (65 FE) MG tablet Take 325 mg by mouth 3 (three) times a week. In the morning     latanoprost (XALATAN) 0.005 % ophthalmic solution Place 1 drop into the left eye at bedtime. Instill 1 drop into the left eye at 6:10 PM daily     loperamide (IMODIUM A-D) 2 MG tablet Take 2 mg by mouth daily.     metoprolol succinate (TOPROL-XL) 25 MG 24 hr tablet Take one-half tablet by  mouth at bedtime 45 tablet 3   Netarsudil Dimesylate 0.02 % SOLN Place 1 drop into the left eye at bedtime. Instill 1 drop into the left eye at 6:15 PM daily     omeprazole (PRILOSEC OTC) 20 MG tablet Take 20 mg by mouth daily before breakfast.     Syringe/Needle, Disp, (SYRINGE 3CC/25GX1") 25G X 1" 3 ML MISC Use for monthly B12 injections 12 each 0   tamsulosin (FLOMAX) 0.4 MG CAPS capsule Take 0.4 mg by mouth at bedtime.     No current facility-administered medications on file prior to visit.    Review of Systems  Constitutional:  Negative for fever.  Eyes:  Negative for visual disturbance.  Respiratory:  Negative for cough, shortness of breath and wheezing.   Cardiovascular:  Negative  for chest pain, palpitations and leg swelling.  Gastrointestinal:  Negative for abdominal pain, blood in stool, constipation, diarrhea and nausea.  Genitourinary:  Negative for difficulty urinating.  Musculoskeletal:  Negative for arthralgias and back pain.  Skin:  Negative for rash.  Neurological:  Negative for light-headedness and headaches.  Psychiatric/Behavioral:  Negative for dysphoric mood. The patient is not nervous/anxious.        Objective:   Vitals:   07/27/21 1104  BP: 132/74  Pulse: 80  Temp: 97.9 F (36.6 C)  SpO2: 97%   Filed Weights   07/27/21 1104  Weight: 171 lb (77.6 kg)   Body mass index is 23.85 kg/m.  BP Readings from Last 3 Encounters:  07/27/21 132/74  05/18/21 128/82  01/14/21 126/80    Wt Readings from Last 3 Encounters:  07/27/21 171 lb (77.6 kg)   05/18/21 177 lb (80.3 kg)  01/14/21 178 lb (80.7 kg)      Physical Exam Constitutional: He appears well-developed and well-nourished. No distress.  HENT:  Head: Normocephalic and atraumatic.  Right Ear: External ear normal.  Left Ear: External ear normal.  Mouth/Throat: Oropharynx is clear and moist.  Eyes: Conjunctivae and EOM are normal.  Neck: Neck supple. No tracheal deviation present. No thyromegaly present.  No carotid bruit  Cardiovascular: Normal rate, regular rhythm, normal heart sounds and intact distal pulses.   No murmur heard. Pulmonary/Chest: Effort normal and breath sounds normal. No respiratory distress. He has no wheezes. He has no rales.  Abdominal: Soft.  Ventral/incisional hernia.  He exhibits no distension. There is no tenderness.  Genitourinary: deferred  Musculoskeletal: He exhibits no edema.  Lymphadenopathy:   He has no cervical adenopathy.  Skin: Skin is warm and dry. He is not diaphoretic.  Psychiatric: He has a normal mood and affect. His behavior is normal.         Assessment & Plan:   Physical exam: Screening blood work  ordered Exercise   regular - goes to gym Weight  normal Substance abuse   none   Reviewed recommended immunizations.   Health Maintenance  Topic Date Due   Zoster Vaccines- Shingrix (2 of 2) 01/23/2017   COVID-19 Vaccine (4 - Booster for Pfizer series) 12/02/2019   INFLUENZA VACCINE  08/10/2021   DEXA SCAN  05/09/2022   TETANUS/TDAP  06/26/2030   Pneumonia Vaccine 51+ Years old  Completed   HPV VACCINES  Aged Out     See Problem List for Assessment and Plan of chronic medical problems.

## 2021-07-26 NOTE — Progress Notes (Deleted)
Subjective:    Patient ID: Michael [name], male    DOB: 12/05/1932, 88 y.o.   MRN: [id number]     HPI Michael [name] is here for follow up of his chronic medical problems, including PAF, h/o TIA, Crohn's dis,   ? labs  Medications and allergies reviewed with patient and updated if appropriate.  Current Outpatient Medications on File Prior to Visit  Medication Sig Dispense Refill   acetaminophen (TYLENOL) 500 MG tablet Take 500-1,000 mg by mouth every 8 (eight) hours as needed for mild pain or headache.      amoxicillin (AMOXIL) 500 MG capsule Take 500 mg by mouth 3 (three) times daily.     apixaban (ELIQUIS) 5 MG TABS tablet Take 1 tablet (5 mg total) by mouth 2 (two) times daily. 28 tablet 0   Ascorbic Acid (VITAMIN C) 1000 MG tablet Take 1,000 mg by mouth daily with breakfast.      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      brimonidine (ALPHAGAN) 0.2 % ophthalmic solution Place 1 drop into the left eye in the morning and at bedtime. Instill 1 drop into the left eye twice a day- 6:45 AM and 5:50 PM     calcium carbonate (CALCIUM 600) 600 MG TABS tablet Take 1 tablet (600 mg total) by mouth daily. 30 tablet    Cholecalciferol (VITAMIN D3) 50 MCG (2000 UT) TABS Take 2,000 Units by mouth daily with breakfast.     cyanocobalamin (,VITAMIN B-12,) 1000 MCG/ML injection INJECT 1ML INTO THE MUSCLE ONCE EVERY THIRTY DAYS 10 mL 0   desmopressin (DDAVP) 0.1 MG tablet Take 0.1 mg by mouth at bedtime.     dorzolamide-timolol (COSOPT) 22.3-6.8 MG/ML ophthalmic solution Place 1 drop into the left eye 2 (two) times daily. Instill 1 drop into the left eye twice a day- 6:35 AM and 5:30 PM     ferrous sulfate 325 (65 FE) MG tablet Take 325 mg by mouth 3 (three) times a week. In the morning     latanoprost (XALATAN) 0.005 % ophthalmic solution Place 1 drop into the left eye at bedtime. Instill 1 drop  into the left eye at 6:10 PM daily     loperamide (IMODIUM A-D) 2 MG tablet Take 2 mg by mouth daily.     metoprolol succinate (TOPROL-XL) 25 MG 24 hr tablet Take one-half tablet by  mouth at bedtime 45 tablet 3   Netarsudil Dimesylate 0.02 % SOLN Place 1 drop into the left eye at bedtime. Instill 1 drop into the left eye at 6:15 PM daily     omeprazole (PRILOSEC OTC) 20 MG tablet Take 20 mg by mouth daily before breakfast.     Syringe/Needle, Disp, (SYRINGE 3CC/25GX1") 25G X 1" 3 ML MISC Use for monthly B12 injections 12 each 0   tamsulosin (FLOMAX) 0.4 MG CAPS capsule Take 0.4 mg by mouth at bedtime.     No current facility-administered medications on file prior to visit.     Review of Systems     Objective:  There were no vitals filed for this visit. BP Readings from Last 3 Encounters:  05/18/21 128/82  01/14/21 126/80  12/31/20 108/64   Wt Readings from Last 3 Encounters:  05/18/21 177 lb (80.3 kg)  01/14/21 178 lb (80.7 kg)  12/31/20 179 lb 2 oz (81.3 kg)   There is no height or weight on file  to calculate BMI.    Physical Exam     Lab Results  Component Value Date   WBC 9.3 01/14/2021   HGB 12.1 (L) 01/14/2021   HCT 35.4 (L) 01/14/2021   PLT 217.0 01/14/2021   GLUCOSE 93 01/14/2021   CHOL 114 01/14/2021   TRIG 182.0 (H) 01/14/2021   HDL 33.30 (L) 01/14/2021   LDLDIRECT 60.6 09/11/2019   LDLCALC 45 01/14/2021   ALT 10 01/14/2021   AST 15 01/14/2021   NA 133 (L) 01/14/2021   K 4.5 01/14/2021   CL 98 01/14/2021   CREATININE 1.00 01/14/2021   BUN 11 01/14/2021   CO2 28 01/14/2021   TSH 1.85 01/14/2021   INR 1.2 09/23/2020   HGBA1C 5.6 01/14/2021     Assessment & Plan:    See Problem List for Assessment and Plan of chronic medical problems.

## 2021-07-27 ENCOUNTER — Ambulatory Visit (INDEPENDENT_AMBULATORY_CARE_PROVIDER_SITE_OTHER): Payer: Medicare Other | Admitting: Internal Medicine

## 2021-07-27 VITALS — BP 132/74 | HR 80 | Temp 97.9°F | Ht 71.0 in | Wt 171.0 lb

## 2021-07-27 DIAGNOSIS — K50012 Crohn's disease of small intestine with intestinal obstruction: Secondary | ICD-10-CM

## 2021-07-27 DIAGNOSIS — I480 Paroxysmal atrial fibrillation: Secondary | ICD-10-CM

## 2021-07-27 DIAGNOSIS — E559 Vitamin D deficiency, unspecified: Secondary | ICD-10-CM | POA: Diagnosis not present

## 2021-07-27 DIAGNOSIS — E538 Deficiency of other specified B group vitamins: Secondary | ICD-10-CM | POA: Diagnosis not present

## 2021-07-27 DIAGNOSIS — Z0000 Encounter for general adult medical examination without abnormal findings: Secondary | ICD-10-CM | POA: Diagnosis not present

## 2021-07-27 DIAGNOSIS — Z8673 Personal history of transient ischemic attack (TIA), and cerebral infarction without residual deficits: Secondary | ICD-10-CM

## 2021-07-27 DIAGNOSIS — I700 Atherosclerosis of aorta: Secondary | ICD-10-CM

## 2021-07-27 LAB — CBC WITH DIFFERENTIAL/PLATELET
Basophils Absolute: 0.1 10*3/uL (ref 0.0–0.1)
Basophils Relative: 0.8 % (ref 0.0–3.0)
Eosinophils Absolute: 0.1 10*3/uL (ref 0.0–0.7)
Eosinophils Relative: 1.6 % (ref 0.0–5.0)
HCT: 34.5 % — ABNORMAL LOW (ref 39.0–52.0)
Hemoglobin: 12 g/dL — ABNORMAL LOW (ref 13.0–17.0)
Lymphocytes Relative: 24 % (ref 12.0–46.0)
Lymphs Abs: 1.7 10*3/uL (ref 0.7–4.0)
MCHC: 34.6 g/dL (ref 30.0–36.0)
MCV: 91.5 fl (ref 78.0–100.0)
Monocytes Absolute: 0.7 10*3/uL (ref 0.1–1.0)
Monocytes Relative: 10.1 % (ref 3.0–12.0)
Neutro Abs: 4.6 10*3/uL (ref 1.4–7.7)
Neutrophils Relative %: 63.5 % (ref 43.0–77.0)
Platelets: 194 10*3/uL (ref 150.0–400.0)
RBC: 3.77 Mil/uL — ABNORMAL LOW (ref 4.22–5.81)
RDW: 16.2 % — ABNORMAL HIGH (ref 11.5–15.5)
WBC: 7.2 10*3/uL (ref 4.0–10.5)

## 2021-07-27 LAB — LIPID PANEL
Cholesterol: 118 mg/dL (ref 0–200)
HDL: 33.1 mg/dL — ABNORMAL LOW (ref >39.00)
LDL Cholesterol: 50 mg/dL (ref 0–99)
NonHDL: 84.82
Total CHOL/HDL Ratio: 4
Triglycerides: 176 mg/dL — ABNORMAL HIGH (ref 0.0–149.0)
VLDL: 35.2 mg/dL (ref 0.0–40.0)

## 2021-07-27 LAB — VITAMIN D 25 HYDROXY (VIT D DEFICIENCY, FRACTURES): VITD: 39.46 ng/mL (ref 30.00–100.00)

## 2021-07-27 LAB — COMPREHENSIVE METABOLIC PANEL
ALT: 11 U/L (ref 0–53)
AST: 14 U/L (ref 0–37)
Albumin: 4 g/dL (ref 3.5–5.2)
Alkaline Phosphatase: 57 U/L (ref 39–117)
BUN: 16 mg/dL (ref 6–23)
CO2: 28 mEq/L (ref 19–32)
Calcium: 9.2 mg/dL (ref 8.4–10.5)
Chloride: 100 mEq/L (ref 96–112)
Creatinine, Ser: 1.12 mg/dL (ref 0.40–1.50)
GFR: 59.06 mL/min — ABNORMAL LOW (ref >60.00)
Glucose, Bld: 92 mg/dL (ref 70–99)
Potassium: 3.8 mEq/L (ref 3.5–5.1)
Sodium: 134 mEq/L — ABNORMAL LOW (ref 135–145)
Total Bilirubin: 0.9 mg/dL (ref 0.2–1.2)
Total Protein: 7.2 g/dL (ref 6.0–8.3)

## 2021-07-27 LAB — TSH: TSH: 2.24 u[IU]/mL (ref 0.35–5.50)

## 2021-07-27 LAB — VITAMIN B12: Vitamin B-12: 243 pg/mL (ref 211–911)

## 2021-07-27 NOTE — Assessment & Plan Note (Signed)
Chronic History of TIA Continue Eliquis 5 mg twice daily Blood pressure well controlled on metoprolol LDL at goal without a statin Continue regular exercise, healthy diet

## 2021-07-27 NOTE — Assessment & Plan Note (Signed)
Chronic Secondary to Crohn's disease Check B12 level

## 2021-07-27 NOTE — Assessment & Plan Note (Signed)
Chronic Scattered plaque seen in abdominal aorta on CT scan, which sounds mild LDL at goal without statin Encouraged regular exercise, healthy diet

## 2021-07-27 NOTE — Assessment & Plan Note (Addendum)
Chronic Controlled at this time-denies any symptoms Management per Dr. Havery Moros

## 2021-07-27 NOTE — Assessment & Plan Note (Signed)
Chronic Check vitamin D level

## 2021-07-27 NOTE — Assessment & Plan Note (Addendum)
Chronic Following with cardiology On metoprolol XL 12.5 mg daily He is taking Eliquis 5 mg twice daily CBC, CMP, TSH

## 2021-08-04 ENCOUNTER — Telehealth: Payer: Self-pay | Admitting: Internal Medicine

## 2021-08-04 DIAGNOSIS — D509 Iron deficiency anemia, unspecified: Secondary | ICD-10-CM

## 2021-08-04 DIAGNOSIS — E538 Deficiency of other specified B group vitamins: Secondary | ICD-10-CM

## 2021-08-04 NOTE — Telephone Encounter (Signed)
Has he been doing the injection consistently monthly at home?  His left was very high/good on 01/14/21 and now is low so I am not sure what has changed since January - does she have any ideas

## 2021-08-04 NOTE — Telephone Encounter (Signed)
Pt wife Suanne Marker is requesting a callback to go over [place] lab results from 07/29/21.   Please advise  CB: [phone] Suanne Marker

## 2021-08-09 NOTE — Telephone Encounter (Signed)
His B12 level was on the low side when we checked it 7/18 so he needs the B12 monthly --he can try to take oral B12 while he is doing the monthly injections and at some point we can try just the oral B12 to see if he absorbs it but he may not with the Crohn's disease.  His iron levels were good, but he still needs to take the iron because they were not too high so I think the amount that he is taking is good.

## 2021-08-11 NOTE — Telephone Encounter (Signed)
Yes  - blood work ordered - ok to return Oct or Nov to have it done

## 2021-08-12 NOTE — Telephone Encounter (Signed)
Notified pt daughter w/ MD response..Princella Pellegrini

## 2021-08-18 DIAGNOSIS — H02054 Trichiasis without entropian left upper eyelid: Secondary | ICD-10-CM | POA: Diagnosis not present

## 2021-09-06 ENCOUNTER — Telehealth (HOSPITAL_COMMUNITY): Payer: Self-pay | Admitting: *Deleted

## 2021-09-06 ENCOUNTER — Other Ambulatory Visit: Payer: Self-pay

## 2021-09-06 MED ORDER — APIXABAN 5 MG PO TABS
5.0000 mg | ORAL_TABLET | Freq: Two times a day (BID) | ORAL | 5 refills | Status: AC
Start: 2021-09-06 — End: ?

## 2021-09-06 NOTE — Telephone Encounter (Signed)
Pt needs refill of Eliquis sent into [facility] pharmacy. Pt follows with Dr. Angelena Form now. Will forward to his office for refill request

## 2021-09-06 NOTE — Telephone Encounter (Signed)
Prescription refill request for Eliquis received. Indication:Afib Last office visit:10/22 Scr:1.1 Age: 88 Weight:77.6 kg  Prescription refilled

## 2021-09-20 ENCOUNTER — Telehealth: Payer: Self-pay | Admitting: Internal Medicine

## 2021-09-21 DIAGNOSIS — H401133 Primary open-angle glaucoma, bilateral, severe stage: Secondary | ICD-10-CM | POA: Diagnosis not present

## 2021-09-26 NOTE — Progress Notes (Unsigned)
Subjective:    Patient ID: Michael [name], male    DOB: 06-Aug-1932, 88 y.o.   MRN: [id number]      HPI Michael [name] is here for No chief complaint on file.        Medications and allergies reviewed with patient and updated if appropriate.  Current Outpatient Medications on File Prior to Visit  Medication Sig Dispense Refill   acetaminophen (TYLENOL) 500 MG tablet Take 500-1,000 mg by mouth every 8 (eight) hours as needed for mild pain or headache.      amoxicillin (AMOXIL) 500 MG capsule Take 500 mg by mouth 3 (three) times daily.     apixaban (ELIQUIS) 5 MG TABS tablet Take 1 tablet (5 mg total) by mouth 2 (two) times daily. 60 tablet 5   Ascorbic Acid (VITAMIN C) 1000 MG tablet Take 1,000 mg by mouth daily with breakfast.      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      brimonidine (ALPHAGAN) 0.2 % ophthalmic solution Place 1 drop into the left eye in the morning and at bedtime. Instill 1 drop into the left eye twice a day- 6:45 AM and 5:50 PM     calcium carbonate (CALCIUM 600) 600 MG TABS tablet Take 1 tablet (600 mg total) by mouth daily. 30 tablet    Cholecalciferol (VITAMIN D3) 50 MCG (2000 UT) TABS Take 2,000 Units by mouth daily with breakfast.     cyanocobalamin (,VITAMIN B-12,) 1000 MCG/ML injection INJECT 1ML INTO THE MUSCLE ONCE EVERY THIRTY DAYS 10 mL 0   desmopressin (DDAVP) 0.1 MG tablet Take 0.1 mg by mouth at bedtime.     dorzolamide-timolol (COSOPT) 22.3-6.8 MG/ML ophthalmic solution Place 1 drop into the left eye 2 (two) times daily. Instill 1 drop into the left eye twice a day- 6:35 AM and 5:30 PM     ferrous sulfate 325 (65 FE) MG tablet Take 325 mg by mouth 3 (three) times a week. In the morning     latanoprost (XALATAN) 0.005 % ophthalmic solution Place 1 drop into the left eye at bedtime. Instill 1 drop into the left eye at 6:10 PM daily     loperamide  (IMODIUM A-D) 2 MG tablet Take 2 mg by mouth daily.     metoprolol succinate (TOPROL-XL) 25 MG 24 hr tablet Take one-half tablet by  mouth at bedtime 45 tablet 3   Netarsudil Dimesylate 0.02 % SOLN Place 1 drop into the left eye at bedtime. Instill 1 drop into the left eye at 6:15 PM daily     omeprazole (PRILOSEC OTC) 20 MG tablet Take 20 mg by mouth daily before breakfast.     Syringe/Needle, Disp, (SYRINGE 3CC/25GX1") 25G X 1" 3 ML MISC Use for monthly B12 injections 12 each 0   tamsulosin (FLOMAX) 0.4 MG CAPS capsule Take 0.4 mg by mouth at bedtime.     No current facility-administered medications on file prior to visit.    Review of Systems     Objective:  There were no vitals filed for this visit. BP Readings from Last 3 Encounters:  07/27/21 132/74  05/18/21 128/82  01/14/21 126/80   Wt Readings from Last 3 Encounters:  07/27/21 171 lb (77.6 kg)  05/18/21 177 lb (80.3 kg)  01/14/21 178 lb (80.7 kg)   There is no height or weight on file to calculate BMI.    Physical Exam  Assessment & Plan:    See Problem List for Assessment and Plan of chronic medical problems.

## 2021-09-27 ENCOUNTER — Ambulatory Visit (INDEPENDENT_AMBULATORY_CARE_PROVIDER_SITE_OTHER): Payer: Medicare Other | Admitting: Internal Medicine

## 2021-09-27 ENCOUNTER — Encounter: Payer: Self-pay | Admitting: Internal Medicine

## 2021-09-27 DIAGNOSIS — R0981 Nasal congestion: Secondary | ICD-10-CM | POA: Diagnosis not present

## 2021-09-27 MED ORDER — AMOXICILLIN-POT CLAVULANATE 875-125 MG PO TABS: 1.0000 | ORAL_TABLET | Freq: Two times a day (BID) | ORAL | 0 refills | Status: AC

## 2021-09-27 MED ORDER — PREDNISONE 20 MG PO TABS: 20.0000 mg | ORAL_TABLET | Freq: Every day | ORAL | 0 refills | Status: AC

## 2021-09-27 NOTE — Patient Instructions (Addendum)
    Your nasal congestion may be an infection so we will try a couple of things to see if that helps.      Medications changes include :   Augmentin twice daily for one week.  Prednisone 20 mg daily for 5 days.   Make sure you are not using an Afrin nasal spray - nasonex is ok.     Your prescription(s) have been sent to your pharmacy.    If your symptoms do not improve we will have you see an [place] and Throat doctor.

## 2021-09-27 NOTE — Assessment & Plan Note (Signed)
Him started in July and is primarily the right nostril States he is using Nasonex and nothing else Symptoms do not really make a lot of sense-not obviously an infection, but that is possible Advised him to double check to make sure he is taking Nasonex and not a different inhaler that may cause rebound congestion We will do a trial of 7 days of Augmentin 875-125 mg twice daily Prednisone 20 mg daily x5 days If there is no improvement will refer to ENT

## 2021-10-05 ENCOUNTER — Encounter (INDEPENDENT_AMBULATORY_CARE_PROVIDER_SITE_OTHER): Payer: Medicare Other | Admitting: Ophthalmology

## 2021-10-05 ENCOUNTER — Encounter (INDEPENDENT_AMBULATORY_CARE_PROVIDER_SITE_OTHER): Payer: Self-pay | Admitting: Ophthalmology

## 2021-10-05 ENCOUNTER — Ambulatory Visit (INDEPENDENT_AMBULATORY_CARE_PROVIDER_SITE_OTHER): Payer: Medicare Other | Admitting: Ophthalmology

## 2021-10-05 DIAGNOSIS — H401113 Primary open-angle glaucoma, right eye, severe stage: Secondary | ICD-10-CM

## 2021-10-05 DIAGNOSIS — H353132 Nonexudative age-related macular degeneration, bilateral, intermediate dry stage: Secondary | ICD-10-CM

## 2021-10-05 DIAGNOSIS — H43822 Vitreomacular adhesion, left eye: Secondary | ICD-10-CM

## 2021-10-05 DIAGNOSIS — H4301 Vitreous prolapse, right eye: Secondary | ICD-10-CM

## 2021-10-05 NOTE — Assessment & Plan Note (Signed)
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  No Photoreceptor layer changes at this time.  We will continue to monitor and observe

## 2021-10-05 NOTE — Progress Notes (Signed)
10/05/2021     CHIEF COMPLAINT Patient presents for  Chief Complaint  Patient presents with   Retina Evaluation   Macular Degeneration      HISTORY OF PRESENT ILLNESS: Michael [name] is a 88 y.o. male who presents to the clinic today for:   HPI     Retina Evaluation           Laterality: left eye         Comments   Age related macular degeneration of both eyes 3 mths dilate os oct Pt states his vision has been stable Pt denies any new floaters or FOL       Last edited by Hurman Horn, MD on 10/05/2021 11:04 AM.      Referring physician: Binnie Rail, MD [place],  [place] [address]  HISTORICAL INFORMATION:   Selected notes from the MEDICAL RECORD NUMBER    Lab Results  Component Value Date   HGBA1C 5.6 01/14/2021     CURRENT MEDICATIONS: Current Outpatient Medications (Ophthalmic Drugs)  Medication Sig   brimonidine (ALPHAGAN) 0.2 % ophthalmic solution Place 1 drop into the left eye in the morning and at bedtime. Instill 1 drop into the left eye twice a day- 6:45 AM and 5:50 PM   dorzolamide-timolol (COSOPT) 22.3-6.8 MG/ML ophthalmic solution Place 1 drop into the left eye 2 (two) times daily. Instill 1 drop into the left eye twice a day- 6:35 AM and 5:30 PM   latanoprost (XALATAN) 0.005 % ophthalmic solution Place 1 drop into the left eye at bedtime. Instill 1 drop into the left eye at 6:10 PM daily   Netarsudil Dimesylate 0.02 % SOLN Place 1 drop into the left eye at bedtime. Instill 1 drop into the left eye at 6:15 PM daily   No current facility-administered medications for this visit. (Ophthalmic Drugs)   Current Outpatient Medications (Other)  Medication Sig   acetaminophen (TYLENOL) 500 MG tablet Take 500-1,000 mg by mouth every 8 (eight) hours as needed for mild pain or headache.    amoxicillin (AMOXIL) 500 MG capsule Take 500 mg by mouth 3 (three) times daily.   apixaban (ELIQUIS) 5 MG TABS tablet Take 1 tablet (5 mg  total) by mouth 2 (two) times daily.   Ascorbic Acid (VITAMIN C) 1000 MG tablet Take 1,000 mg by mouth daily with breakfast.    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    calcium carbonate (CALCIUM 600) 600 MG TABS tablet Take 1 tablet (600 mg total) by mouth daily.   Cholecalciferol (VITAMIN D3) 50 MCG (2000 UT) TABS Take 2,000 Units by mouth daily with breakfast.   cyanocobalamin (,VITAMIN B-12,) 1000 MCG/ML injection INJECT 1ML INTO THE MUSCLE ONCE EVERY THIRTY DAYS   desmopressin (DDAVP) 0.1 MG tablet Take 0.1 mg by mouth at bedtime.   ferrous sulfate 325 (65 FE) MG tablet Take 325 mg by mouth 3 (three) times a week. In the morning   loperamide (IMODIUM A-D) 2 MG tablet Take 2 mg by mouth daily.   metoprolol succinate (TOPROL-XL) 25 MG 24 hr tablet Take one-half tablet by  mouth at bedtime   omeprazole (PRILOSEC OTC) 20 MG tablet Take 20 mg by mouth daily before breakfast.   Syringe/Needle, Disp, (SYRINGE 3CC/25GX1") 25G X 1" 3 ML MISC Use for monthly B12 injections   tamsulosin (FLOMAX) 0.4 MG CAPS capsule Take 0.4 mg by mouth  at bedtime.   No current facility-administered medications for this visit. (Other)      REVIEW OF SYSTEMS: ROS   Negative for: Constitutional, Gastrointestinal, Neurological, Skin, Genitourinary, Musculoskeletal, HENT, Endocrine, Cardiovascular, Eyes, Respiratory, Psychiatric, Allergic/Imm, Heme/Lymph Last edited by Orene Desanctis D, CMA on 10/05/2021 10:28 AM.       ALLERGIES No Known Allergies  PAST MEDICAL HISTORY Past Medical History:  Diagnosis Date   Adrenal insufficiency (HCC)    Anemia    Atrial fibrillation (HCC)    Avascular necrosis ([facility])    Clavicle fracture 11/02/2017   Colon polyp    Colon polyps    Crohn's disease ([facility])    Glaucoma    Inguinal hernia recurrent bilateral    Low testosterone    Osteopenia    Retinal ischemia     Past Surgical History:  Procedure Laterality Date   ABDOMINAL SURGERY     BIOPSY  03/19/2020   Procedure: BIOPSY;  Surgeon: Yetta Flock, MD;  Location: [facility];  Service: Gastroenterology;;   COLONOSCOPY N/A 12/13/2015   Procedure: COLONOSCOPY;  Surgeon: Laurence Spates, MD;  Location: [facility];  Service: Endoscopy;  Laterality: N/A;   COLONOSCOPY WITH PROPOFOL N/A 03/25/2019   Procedure: COLONOSCOPY WITH PROPOFOL;  Surgeon: Yetta Flock, MD;  Location: [facility];  Service: Gastroenterology;  Laterality: N/A;   COLONOSCOPY WITH PROPOFOL N/A 08/26/2019   Procedure: COLONOSCOPY WITH PROPOFOL;  Surgeon: Yetta Flock, MD;  Location: [facility];  Service: Gastroenterology;  Laterality: N/A;   COLONOSCOPY WITH PROPOFOL N/A 03/19/2020   Procedure: COLONOSCOPY WITH PROPOFOL;  Surgeon: Yetta Flock, MD;  Location: [facility];  Service: Gastroenterology;  Laterality: N/A;   ENDOSCOPIC MUCOSAL RESECTION N/A 08/26/2019   Procedure: ENDOSCOPIC MUCOSAL RESECTION;  Surgeon: Yetta Flock, MD;  Location: [facility];  Service: Gastroenterology;  Laterality: N/A;   INGUINAL HERNIA REPAIR Bilateral 05/21/2014   Procedure: OPEN BILATERAL INGUINAL HERNIA REPAIRS WITH MESH;  Surgeon: Donnie Mesa, MD;  Location: [facility];  Service: General;  Laterality: Bilateral;   POLYPECTOMY  03/25/2019   Procedure: POLYPECTOMY;  Surgeon: Yetta Flock, MD;  Location: [facility];  Service: Gastroenterology;;   POLYPECTOMY  08/26/2019   Procedure: POLYPECTOMY;  Surgeon: Yetta Flock, MD;  Location: [facility];  Service: Gastroenterology;;   POLYPECTOMY  03/19/2020   Procedure: POLYPECTOMY;  Surgeon: Yetta Flock, MD;  Location: [facility];  Service: Gastroenterology;;   Woodward Ku  08/26/2019   Procedure: Sprayed methylene blue;  Surgeon: Yetta Flock, MD;  Location: [facility];  Service: Gastroenterology;;   SMALL  INTESTINE SURGERY  1968, 2001    related to Chrohn's disease   TONSILLECTOMY      FAMILY HISTORY Family History  Problem Relation Age of Onset   Deep vein thrombosis Mother    Transient ischemic attack Mother    Heart disease Father    Cancer Maternal Aunt        unknown type; dx after 18   Cancer Cousin        maternal cousin; unknown type   Cancer Cousin        paternal cousin; unknown type; dx after 22   Stomach cancer Neg Hx    Colon cancer Neg Hx    Esophageal cancer Neg Hx    Pancreatic cancer Neg Hx    Rectal cancer Neg Hx    Colon polyps Neg Hx     SOCIAL HISTORY Social History   Tobacco Use   Smoking  status: Former    Types: Cigarettes    Quit date: 04/01/1976    Years since quitting: 45.5   Smokeless tobacco: Never  Vaping Use   Vaping Use: Never used  Substance Use Topics   Alcohol use: Yes    Alcohol/week: 2.0 standard drinks of alcohol    Types: 2 Glasses of wine per week    Comment: social   Drug use: No         OPHTHALMIC EXAM:  Base Eye Exam     Visual Acuity     ETDRS         Tonometry (Tonopen, 10:34 AM)       Right Left   Pressure 8 10         Pupils       Pupils   Right PERRL   Left PERRL         Extraocular Movement       Right Left    Ortho Ortho    -- -- --  --  --  -- -- --   -- -- --  --  --  -- -- --           Neuro/Psych     Oriented x3: Yes         Dilation   Pt would not like to get his left eye dilated because he is driving alone today and only has H/M in his right eye          Slit Lamp and Fundus Exam     Slit Lamp Exam       Right Left   Lids/Lashes Normal Normal   Conjunctiva/Sclera Pale bleb superiorly looks fibrotic as well White and quiet   Cornea Clear Clear   Anterior Chamber Deep and quiet, vitreous prolapsed through pupil and visual axis not dilated Deep and quiet   [place] Patent PI superonasal Round and reactive   Lens Centered posterior chamber intraocular lens  Centered posterior chamber intraocular lens   Anterior Vitreous Normal Normal         Fundus Exam       Right Left   Disc Per patient do not dilate Per patient do not dilate            IMAGING AND PROCEDURES  Imaging and Procedures for 10/10/21  OCT, Retina - OU - Both Eyes       Left Eye Quality was good. Scan locations included subfoveal. Central Foveal Thickness: 278. Progression has been stable. Findings include retinal drusen , vitreous traction, outer retinal atrophy, vitreomacular adhesion .   Notes OD no view               ASSESSMENT/PLAN:  Intermediate stage nonexudative age-related macular degeneration of both eyes The nature of age--related macular degeneration was discussed with the patient as well as the distinction between dry and wet types. Checking an Amsler Grid daily with advice to return immediately should a distortion develop, was given to the patient. The patient 's smoking status now and in the past was determined and advice based on the AREDS study was provided regarding the consumption of antioxidant supplements. AREDS 2 vitamin formulation was recommended. Consumption of dark leafy vegetables and fresh fruits of various colors was recommended. Treatment modalities for wet macular degeneration particularly the use of intravitreal injections of anti-blood vessel growth factors was discussed with the patient. Avastin, Lucentis, and Eylea are the available options. On occasion, therapy includes the use of photodynamic therapy  and thermal laser. Stressed to the patient do not rub eyes.  Patient was advised to check Amsler Grid daily and return immediately if changes are noted. Instructions on using the grid were given to the patient. All patient questions were answered.  No sign of CNVM  Vitreomacular adhesion of left eye Minor foveal distortion inner aspect of fovea.  No Photoreceptor layer changes at this time.  We will continue to monitor and  observe  Vitreous prolapse of right eye INTo and through the pupillary margin, stable  Primary open angle glaucoma of right eye, severe stage Accounts for the acuity in the past.  No view  today as patient declined dilation     ICD-10-CM   1. Intermediate stage nonexudative age-related macular degeneration of both eyes  H35.3132 OCT, Retina - OU - Both Eyes    2. Vitreomacular adhesion of left eye  H43.822     3. Vitreous prolapse of right eye  H43.01     4. Primary open angle glaucoma of right eye, severe stage  H40.1113       1.  OS, no signs of progression to wet AMD  2.  Macular atrophy by OCT today likely related to the extensive advanced glaucoma.  3.  OS with VMT but no foveal impact no acuity impact observed  Ophthalmic Meds Ordered this visit:  No orders of the defined types were placed in this encounter.      Return in about 6 months (around 04/05/2022) for DILATE OU, OCT.  There are no Patient Instructions on file for this visit.   Explained the diagnoses, plan, and follow up with the patient and they expressed understanding.  Patient expressed understanding of the importance of proper follow up care.   Clent Demark Amer Alcindor M.D. Diseases & Surgery of the Retina and Vitreous [facility] [place] 10/10/21     Abbreviations: M myopia (nearsighted); A astigmatism; H hyperopia (farsighted); P presbyopia; Mrx spectacle prescription;  CTL contact lenses; OD right eye; OS left eye; OU both eyes  XT exotropia; ET esotropia; PEK punctate epithelial keratitis; PEE punctate epithelial erosions; DES dry eye syndrome; MGD meibomian gland dysfunction; ATs artificial tears; PFAT's preservative free artificial tears; [place] nuclear sclerotic cataract; PSC posterior subcapsular cataract; ERM epi-retinal membrane; PVD posterior vitreous detachment; RD retinal detachment; DM diabetes mellitus; DR diabetic retinopathy; NPDR non-proliferative diabetic retinopathy; PDR proliferative  diabetic retinopathy; CSME clinically significant macular edema; DME diabetic macular edema; dbh dot blot hemorrhages; CWS cotton wool spot; POAG primary open angle glaucoma; C/D cup-to-disc ratio; HVF humphrey visual field; GVF goldmann visual field; OCT optical coherence tomography; IOP intraocular pressure; BRVO Branch retinal vein occlusion; CRVO central retinal vein occlusion; CRAO central retinal artery occlusion; BRAO branch retinal artery occlusion; RT retinal tear; SB scleral buckle; PPV pars plana vitrectomy; VH Vitreous hemorrhage; PRP panretinal laser photocoagulation; IVK intravitreal kenalog; VMT vitreomacular traction; MH Macular hole;  NVD neovascularization of the disc; NVE neovascularization elsewhere; AREDS age related eye disease study; ARMD age related macular degeneration; POAG primary open angle glaucoma; EBMD epithelial/anterior basement membrane dystrophy; ACIOL anterior chamber intraocular lens; IOL intraocular lens; PCIOL posterior chamber intraocular lens; Phaco/IOL phacoemulsification with intraocular lens placement; [place] photorefractive keratectomy; LASIK laser assisted in situ keratomileusis; HTN hypertension; DM diabetes mellitus; COPD chronic obstructive pulmonary disease

## 2021-10-05 NOTE — Assessment & Plan Note (Signed)
The nature of age--related macular degeneration was discussed with the patient as well as the distinction between dry and wet types. Checking an Amsler Grid daily with advice to return immediately should a distortion develop, was given to the patient. The patient 's smoking status now and in the past was determined and advice based on the AREDS study was provided regarding the consumption of antioxidant supplements. AREDS 2 vitamin formulation was recommended. Consumption of dark leafy vegetables and fresh fruits of various colors was recommended. Treatment modalities for wet macular degeneration particularly the use of intravitreal injections of anti-blood vessel growth factors was discussed with the patient. Avastin, Lucentis, and Eylea are the available options. On occasion, therapy includes the use of photodynamic therapy and thermal laser. Stressed to the patient do not rub eyes.  Patient was advised to check Amsler Grid daily and return immediately if changes are noted. Instructions on using the grid were given to the patient. All patient questions were answered.  No sign of CNVM

## 2021-10-05 NOTE — Assessment & Plan Note (Signed)
Accounts for the acuity in the past.  No view  today as patient declined dilation

## 2021-10-05 NOTE — Assessment & Plan Note (Signed)
INTo and through the pupillary margin, stable

## 2021-10-26 ENCOUNTER — Emergency Department (HOSPITAL_COMMUNITY): Payer: No Typology Code available for payment source

## 2021-10-26 ENCOUNTER — Observation Stay (HOSPITAL_COMMUNITY)
Admission: EM | Admit: 2021-10-26 | Discharge: 2021-10-29 | Disposition: A | Discharge status: Home / Self Care | Payer: No Typology Code available for payment source | Attending: Internal Medicine | Admitting: Internal Medicine

## 2021-10-26 ENCOUNTER — Observation Stay (HOSPITAL_COMMUNITY): Payer: No Typology Code available for payment source

## 2021-10-26 ENCOUNTER — Encounter (HOSPITAL_COMMUNITY): Payer: Self-pay

## 2021-10-26 ENCOUNTER — Other Ambulatory Visit: Payer: Self-pay

## 2021-10-26 DIAGNOSIS — Z79899 Other long term (current) drug therapy: Secondary | ICD-10-CM | POA: Insufficient documentation

## 2021-10-26 DIAGNOSIS — I480 Paroxysmal atrial fibrillation: Secondary | ICD-10-CM | POA: Diagnosis not present

## 2021-10-26 DIAGNOSIS — R4701 Aphasia: Secondary | ICD-10-CM

## 2021-10-26 DIAGNOSIS — I6782 Cerebral ischemia: Secondary | ICD-10-CM | POA: Diagnosis not present

## 2021-10-26 DIAGNOSIS — I499 Cardiac arrhythmia, unspecified: Secondary | ICD-10-CM | POA: Diagnosis not present

## 2021-10-26 DIAGNOSIS — U071 COVID-19: Secondary | ICD-10-CM

## 2021-10-26 DIAGNOSIS — Z7901 Long term (current) use of anticoagulants: Secondary | ICD-10-CM | POA: Diagnosis not present

## 2021-10-26 DIAGNOSIS — R6889 Other general symptoms and signs: Secondary | ICD-10-CM | POA: Diagnosis not present

## 2021-10-26 DIAGNOSIS — R410 Disorientation, unspecified: Secondary | ICD-10-CM | POA: Diagnosis not present

## 2021-10-26 DIAGNOSIS — G9340 Encephalopathy, unspecified: Secondary | ICD-10-CM | POA: Diagnosis not present

## 2021-10-26 DIAGNOSIS — K5090 Crohn's disease, unspecified, without complications: Secondary | ICD-10-CM | POA: Diagnosis present

## 2021-10-26 DIAGNOSIS — Z8673 Personal history of transient ischemic attack (TIA), and cerebral infarction without residual deficits: Secondary | ICD-10-CM | POA: Diagnosis not present

## 2021-10-26 DIAGNOSIS — I10 Essential (primary) hypertension: Secondary | ICD-10-CM | POA: Diagnosis not present

## 2021-10-26 DIAGNOSIS — J811 Chronic pulmonary edema: Secondary | ICD-10-CM | POA: Diagnosis not present

## 2021-10-26 DIAGNOSIS — Z743 Need for continuous supervision: Secondary | ICD-10-CM | POA: Diagnosis not present

## 2021-10-26 DIAGNOSIS — R29818 Other symptoms and signs involving the nervous system: Secondary | ICD-10-CM | POA: Diagnosis not present

## 2021-10-26 DIAGNOSIS — H409 Unspecified glaucoma: Secondary | ICD-10-CM | POA: Diagnosis present

## 2021-10-26 DIAGNOSIS — Z6823 Body mass index (BMI) 23.0-23.9, adult: Secondary | ICD-10-CM | POA: Insufficient documentation

## 2021-10-26 DIAGNOSIS — Z86718 Personal history of other venous thrombosis and embolism: Secondary | ICD-10-CM | POA: Insufficient documentation

## 2021-10-26 DIAGNOSIS — G3184 Mild cognitive impairment, so stated: Secondary | ICD-10-CM | POA: Diagnosis present

## 2021-10-26 DIAGNOSIS — R413 Other amnesia: Secondary | ICD-10-CM | POA: Diagnosis present

## 2021-10-26 DIAGNOSIS — E2740 Unspecified adrenocortical insufficiency: Secondary | ICD-10-CM

## 2021-10-26 DIAGNOSIS — E669 Obesity, unspecified: Secondary | ICD-10-CM | POA: Diagnosis not present

## 2021-10-26 DIAGNOSIS — E876 Hypokalemia: Secondary | ICD-10-CM

## 2021-10-26 DIAGNOSIS — D649 Anemia, unspecified: Secondary | ICD-10-CM

## 2021-10-26 DIAGNOSIS — Z87891 Personal history of nicotine dependence: Secondary | ICD-10-CM | POA: Insufficient documentation

## 2021-10-26 DIAGNOSIS — R531 Weakness: Secondary | ICD-10-CM

## 2021-10-26 DIAGNOSIS — D696 Thrombocytopenia, unspecified: Secondary | ICD-10-CM | POA: Diagnosis not present

## 2021-10-26 DIAGNOSIS — E538 Deficiency of other specified B group vitamins: Secondary | ICD-10-CM | POA: Diagnosis present

## 2021-10-26 DIAGNOSIS — I639 Cerebral infarction, unspecified: Secondary | ICD-10-CM | POA: Diagnosis not present

## 2021-10-26 LAB — DIFFERENTIAL
Abs Immature Granulocytes: 0.07 10*3/uL (ref 0.00–0.07)
Basophils Absolute: 0 10*3/uL (ref 0.0–0.1)
Basophils Relative: 1 %
Eosinophils Absolute: 0 10*3/uL (ref 0.0–0.5)
Eosinophils Relative: 0 %
Immature Granulocytes: 1 %
Lymphocytes Relative: 23 %
Lymphs Abs: 1.3 10*3/uL (ref 0.7–4.0)
Monocytes Absolute: 0.8 10*3/uL (ref 0.1–1.0)
Monocytes Relative: 14 %
Neutro Abs: 3.5 10*3/uL (ref 1.7–7.7)
Neutrophils Relative %: 61 %

## 2021-10-26 LAB — URINALYSIS, ROUTINE W REFLEX MICROSCOPIC
Bilirubin Urine: NEGATIVE
Glucose, UA: NEGATIVE mg/dL
Hgb urine dipstick: NEGATIVE
Ketones, ur: NEGATIVE mg/dL
Leukocytes,Ua: NEGATIVE
Nitrite: NEGATIVE
Protein, ur: NEGATIVE mg/dL
Specific Gravity, Urine: 1.014 (ref 1.005–1.030)
pH: 5 (ref 5.0–8.0)

## 2021-10-26 LAB — D-DIMER, QUANTITATIVE: D-Dimer, Quant: 0.71 ug/mL-FEU — ABNORMAL HIGH (ref 0.00–0.50)

## 2021-10-26 LAB — COMPREHENSIVE METABOLIC PANEL
ALT: 9 U/L (ref 0–44)
AST: 16 U/L (ref 15–41)
Albumin: 3.2 g/dL — ABNORMAL LOW (ref 3.5–5.0)
Alkaline Phosphatase: 57 U/L (ref 38–126)
Anion gap: 9 (ref 5–15)
BUN: 11 mg/dL (ref 8–23)
CO2: 24 mmol/L (ref 22–32)
Calcium: 8.2 mg/dL — ABNORMAL LOW (ref 8.9–10.3)
Chloride: 102 mmol/L (ref 98–111)
Creatinine, Ser: 1.04 mg/dL (ref 0.61–1.24)
GFR, Estimated: >60 mL/min (ref >60)
Glucose, Bld: 113 mg/dL — ABNORMAL HIGH (ref 70–99)
Potassium: 3.2 mmol/L — ABNORMAL LOW (ref 3.5–5.1)
Sodium: 135 mmol/L (ref 135–145)
Total Bilirubin: 0.9 mg/dL (ref 0.3–1.2)
Total Protein: 6.2 g/dL — ABNORMAL LOW (ref 6.5–8.1)

## 2021-10-26 LAB — CBC
HCT: 32.3 % — ABNORMAL LOW (ref 39.0–52.0)
Hemoglobin: 10.6 g/dL — ABNORMAL LOW (ref 13.0–17.0)
MCH: 30.3 pg (ref 26.0–34.0)
MCHC: 32.8 g/dL (ref 30.0–36.0)
MCV: 92.3 fL (ref 80.0–100.0)
Platelets: 158 10*3/uL (ref 150–400)
RBC: 3.5 MIL/uL — ABNORMAL LOW (ref 4.22–5.81)
RDW: 15.9 % — ABNORMAL HIGH (ref 11.5–15.5)
WBC: 5.7 10*3/uL (ref 4.0–10.5)
nRBC: 0 % (ref 0.0–0.2)

## 2021-10-26 LAB — PROTIME-INR
INR: 1.4 — ABNORMAL HIGH (ref 0.8–1.2)
Prothrombin Time: 16.8 seconds — ABNORMAL HIGH (ref 11.4–15.2)

## 2021-10-26 LAB — I-STAT CHEM 8, ED
BUN: 12 mg/dL (ref 8–23)
Calcium, Ion: 1.08 mmol/L — ABNORMAL LOW (ref 1.15–1.40)
Chloride: 98 mmol/L (ref 98–111)
Creatinine, Ser: 1 mg/dL (ref 0.61–1.24)
Glucose, Bld: 108 mg/dL — ABNORMAL HIGH (ref 70–99)
HCT: 33 % — ABNORMAL LOW (ref 39.0–52.0)
Hemoglobin: 11.2 g/dL — ABNORMAL LOW (ref 13.0–17.0)
Potassium: 3.2 mmol/L — ABNORMAL LOW (ref 3.5–5.1)
Sodium: 136 mmol/L (ref 135–145)
TCO2: 22 mmol/L (ref 22–32)

## 2021-10-26 LAB — MAGNESIUM: Magnesium: 1.2 mg/dL — ABNORMAL LOW (ref 1.7–2.4)

## 2021-10-26 LAB — APTT: aPTT: 51 seconds — ABNORMAL HIGH (ref 24–36)

## 2021-10-26 LAB — PROCALCITONIN: Procalcitonin: <0.1 ng/mL

## 2021-10-26 LAB — FERRITIN: Ferritin: 107 ng/mL (ref 24–336)

## 2021-10-26 LAB — TROPONIN I (HIGH SENSITIVITY)
Troponin I (High Sensitivity): 20 ng/L — ABNORMAL HIGH (ref <18)
Troponin I (High Sensitivity): 24 ng/L — ABNORMAL HIGH (ref <18)

## 2021-10-26 LAB — RESP PANEL BY RT-PCR (FLU A&B, COVID) ARPGX2
Influenza A by PCR: NEGATIVE
Influenza B by PCR: NEGATIVE
SARS Coronavirus 2 by RT PCR: POSITIVE — AB

## 2021-10-26 LAB — CBG MONITORING, ED: Glucose-Capillary: 96 mg/dL (ref 70–99)

## 2021-10-26 LAB — ETHANOL: Alcohol, Ethyl (B): <10 mg/dL (ref <10)

## 2021-10-26 LAB — FIBRINOGEN: Fibrinogen: 429 mg/dL (ref 210–475)

## 2021-10-26 LAB — TSH: TSH: 1.747 u[IU]/mL (ref 0.350–4.500)

## 2021-10-26 LAB — LACTIC ACID, PLASMA: Lactic Acid, Venous: 1.9 mmol/L (ref 0.5–1.9)

## 2021-10-26 MED ORDER — SODIUM CHLORIDE 0.9 % IV SOLN: INTRAVENOUS | Status: AC

## 2021-10-26 MED ORDER — SODIUM CHLORIDE 0.9% FLUSH: 3.0000 mL | Freq: Once | INTRAVENOUS | Status: DC

## 2021-10-26 MED ORDER — POTASSIUM CHLORIDE 10 MEQ/100ML IV SOLN
INTRAVENOUS | Status: AC
  Administered 2021-10-26: 10 meq via INTRAVENOUS
  Filled 2021-10-26: qty 100

## 2021-10-26 MED ORDER — ONDANSETRON HCL 4 MG/2ML IJ SOLN: 4.0000 mg | Freq: Four times a day (QID) | INTRAMUSCULAR | Status: DC | PRN

## 2021-10-26 MED ORDER — PANTOPRAZOLE SODIUM 40 MG PO TBEC
40.0000 mg | DELAYED_RELEASE_TABLET | Freq: Every day | ORAL | Status: DC
  Administered 2021-10-27 – 2021-10-29 (×3): 40 mg via ORAL
  Filled 2021-10-26 (×3): qty 1

## 2021-10-26 MED ORDER — ONDANSETRON HCL 4 MG PO TABS: 4.0000 mg | ORAL_TABLET | Freq: Four times a day (QID) | ORAL | Status: DC | PRN

## 2021-10-26 MED ORDER — POTASSIUM CHLORIDE 10 MEQ/100ML IV SOLN
10.0000 meq | INTRAVENOUS | Status: AC
  Administered 2021-10-26 – 2021-10-27 (×2): 10 meq via INTRAVENOUS
  Filled 2021-10-26 (×2): qty 100

## 2021-10-26 MED ORDER — ACETAMINOPHEN 650 MG RE SUPP: 650.0000 mg | Freq: Four times a day (QID) | RECTAL | Status: DC | PRN

## 2021-10-26 MED ORDER — VANCOMYCIN HCL 1250 MG/250ML IV SOLN: 1250.0000 mg | INTRAVENOUS | Status: DC

## 2021-10-26 MED ORDER — LATANOPROST 0.005 % OP SOLN
1.0000 [drp] | Freq: Every day | OPHTHALMIC | Status: DC
  Administered 2021-10-27: 1 [drp] via OPHTHALMIC
  Filled 2021-10-26 (×2): qty 2.5

## 2021-10-26 MED ORDER — LACTATED RINGERS IV BOLUS
1000.0000 mL | Freq: Once | INTRAVENOUS | Status: AC
  Administered 2021-10-26: 1000 mL via INTRAVENOUS

## 2021-10-26 MED ORDER — VITAMIN D 25 MCG (1000 UNIT) PO TABS
1000.0000 [IU] | ORAL_TABLET | Freq: Every day | ORAL | Status: DC
  Administered 2021-10-27 – 2021-10-29 (×3): 1000 [IU] via ORAL
  Filled 2021-10-26 (×3): qty 1

## 2021-10-26 MED ORDER — FERROUS SULFATE 325 (65 FE) MG PO TABS
325.0000 mg | ORAL_TABLET | ORAL | Status: DC
  Administered 2021-10-27 – 2021-10-29 (×2): 325 mg via ORAL
  Filled 2021-10-26 (×2): qty 1

## 2021-10-26 MED ORDER — SODIUM CHLORIDE 0.9 % IV SOLN: 2.0000 g | Freq: Once | INTRAVENOUS | Status: DC

## 2021-10-26 MED ORDER — OMEPRAZOLE MAGNESIUM 20 MG PO TBEC: 20.0000 mg | DELAYED_RELEASE_TABLET | Freq: Every day | ORAL | Status: DC

## 2021-10-26 MED ORDER — BRIMONIDINE TARTRATE 0.2 % OP SOLN
1.0000 [drp] | Freq: Two times a day (BID) | OPHTHALMIC | Status: DC
  Administered 2021-10-27 – 2021-10-29 (×4): 1 [drp] via OPHTHALMIC
  Filled 2021-10-26 (×2): qty 5

## 2021-10-26 MED ORDER — ACETAMINOPHEN 650 MG RE SUPP: 650.0000 mg | RECTAL | Status: DC

## 2021-10-26 MED ORDER — LEVALBUTEROL HCL 0.63 MG/3ML IN NEBU: 0.6300 mg | INHALATION_SOLUTION | Freq: Four times a day (QID) | RESPIRATORY_TRACT | Status: DC | PRN

## 2021-10-26 MED ORDER — VITAMIN C 500 MG PO TABS: 500.0000 mg | ORAL_TABLET | Freq: Every day | ORAL | Status: DC

## 2021-10-26 MED ORDER — METRONIDAZOLE 500 MG/100ML IV SOLN: 500.0000 mg | Freq: Once | INTRAVENOUS | Status: DC

## 2021-10-26 MED ORDER — ACETAMINOPHEN 325 MG PO TABS
650.0000 mg | ORAL_TABLET | Freq: Four times a day (QID) | ORAL | Status: DC | PRN
  Administered 2021-10-27 – 2021-10-28 (×2): 650 mg via ORAL
  Filled 2021-10-26 (×3): qty 2

## 2021-10-26 MED ORDER — ZINC SULFATE 220 (50 ZN) MG PO CAPS
220.0000 mg | ORAL_CAPSULE | Freq: Every day | ORAL | Status: DC
  Administered 2021-10-26 – 2021-10-29 (×4): 220 mg via ORAL
  Filled 2021-10-26 (×4): qty 1

## 2021-10-26 MED ORDER — ACETAMINOPHEN 325 MG PO TABS
ORAL_TABLET | ORAL | Status: AC
  Filled 2021-10-26: qty 2

## 2021-10-26 MED ORDER — VANCOMYCIN HCL 1500 MG/300ML IV SOLN
1500.0000 mg | Freq: Once | INTRAVENOUS | Status: DC
  Filled 2021-10-26: qty 300

## 2021-10-26 MED ORDER — TAMSULOSIN HCL 0.4 MG PO CAPS
0.4000 mg | ORAL_CAPSULE | Freq: Every day | ORAL | Status: DC
  Administered 2021-10-26 – 2021-10-28 (×3): 0.4 mg via ORAL
  Filled 2021-10-26 (×3): qty 1

## 2021-10-26 MED ORDER — DORZOLAMIDE HCL-TIMOLOL MAL 2-0.5 % OP SOLN
1.0000 [drp] | Freq: Two times a day (BID) | OPHTHALMIC | Status: DC
  Administered 2021-10-27 – 2021-10-29 (×4): 1 [drp] via OPHTHALMIC
  Filled 2021-10-26 (×2): qty 10

## 2021-10-26 MED ORDER — MOLNUPIRAVIR EUA 200MG CAPSULE
4.0000 | ORAL_CAPSULE | ORAL | Status: AC
  Administered 2021-10-26: 800 mg via ORAL
  Filled 2021-10-26: qty 4

## 2021-10-26 MED ORDER — DILTIAZEM HCL-DEXTROSE 125-5 MG/125ML-% IV SOLN (PREMIX)
5.0000 mg/h | INTRAVENOUS | Status: DC
  Administered 2021-10-27: 5 mg/h via INTRAVENOUS
  Filled 2021-10-26 (×2): qty 125

## 2021-10-26 MED ORDER — LOPERAMIDE HCL 2 MG PO TABS: 2.0000 mg | ORAL_TABLET | Freq: Every day | ORAL | Status: DC | PRN

## 2021-10-26 MED ORDER — MOLNUPIRAVIR EUA 200MG CAPSULE
4.0000 | ORAL_CAPSULE | Freq: Two times a day (BID) | ORAL | Status: DC
  Administered 2021-10-27 – 2021-10-29 (×5): 800 mg via ORAL
  Filled 2021-10-26: qty 4

## 2021-10-26 MED ORDER — VITAMIN D3 50 MCG (2000 UT) PO TABS: 2000.0000 [IU] | ORAL_TABLET | Freq: Every day | ORAL | Status: DC

## 2021-10-26 MED ORDER — ACETAMINOPHEN 325 MG PO TABS: 650.0000 mg | ORAL_TABLET | ORAL | Status: DC

## 2021-10-26 MED ORDER — VITAMIN C 500 MG PO TABS
1000.0000 mg | ORAL_TABLET | Freq: Every day | ORAL | Status: DC
  Administered 2021-10-27 – 2021-10-29 (×3): 1000 mg via ORAL
  Filled 2021-10-26 (×3): qty 2

## 2021-10-26 MED ORDER — GUAIFENESIN-DM 100-10 MG/5ML PO SYRP: 5.0000 mL | ORAL_SOLUTION | ORAL | Status: DC | PRN

## 2021-10-26 MED ORDER — POTASSIUM CHLORIDE CRYS ER 20 MEQ PO TBCR: 20.0000 meq | EXTENDED_RELEASE_TABLET | Freq: Once | ORAL | Status: DC

## 2021-10-26 MED ORDER — SODIUM CHLORIDE 0.9 % IV SOLN: 2.0000 g | Freq: Two times a day (BID) | INTRAVENOUS | Status: DC

## 2021-10-26 MED ORDER — APIXABAN 5 MG PO TABS
5.0000 mg | ORAL_TABLET | Freq: Two times a day (BID) | ORAL | Status: DC
  Administered 2021-10-26 – 2021-10-29 (×6): 5 mg via ORAL
  Filled 2021-10-26 (×6): qty 1

## 2021-10-26 MED ORDER — ACETAMINOPHEN 500 MG PO TABS
1000.0000 mg | ORAL_TABLET | ORAL | Status: AC
  Administered 2021-10-26: 1000 mg via ORAL
  Filled 2021-10-26: qty 2

## 2021-10-26 MED ORDER — VANCOMYCIN HCL IN DEXTROSE 1-5 GM/200ML-% IV SOLN: 1000.0000 mg | Freq: Once | INTRAVENOUS | Status: DC

## 2021-10-26 MED ORDER — MAGNESIUM SULFATE 2 GM/50ML IV SOLN
2.0000 g | Freq: Once | INTRAVENOUS | Status: AC
  Administered 2021-10-26: 2 g via INTRAVENOUS
  Filled 2021-10-26: qty 50

## 2021-10-26 MED ORDER — DILTIAZEM HCL-DEXTROSE 125-5 MG/125ML-% IV SOLN (PREMIX)
INTRAVENOUS | Status: AC
  Administered 2021-10-26: 5 mg/h via INTRAVENOUS
  Filled 2021-10-26: qty 125

## 2021-10-26 MED ORDER — NETARSUDIL DIMESYLATE 0.02 % OP SOLN: 1.0000 [drp] | Freq: Every day | OPHTHALMIC | Status: DC

## 2021-10-26 MED ORDER — IOHEXOL 350 MG/ML SOLN
75.0000 mL | Freq: Once | INTRAVENOUS | Status: AC | PRN
  Administered 2021-10-26: 75 mL via INTRAVENOUS

## 2021-10-26 MED ORDER — LORAZEPAM 2 MG/ML IJ SOLN
1.0000 mg | Freq: Once | INTRAMUSCULAR | Status: AC
  Administered 2021-10-26: 1 mg via INTRAVENOUS
  Filled 2021-10-26: qty 1

## 2021-10-26 MED ORDER — METOPROLOL SUCCINATE ER 25 MG PO TB24: 12.5000 mg | ORAL_TABLET | Freq: Every day | ORAL | Status: DC

## 2021-10-26 NOTE — ED Notes (Signed)
Patient transported to MRI prior to medications being verified, will resume medication per [facility] once patient returns to room

## 2021-10-26 NOTE — Progress Notes (Signed)
Pharmacy Antibiotic Note  Michael [name] is a 88 y.o. male admitted on 10/26/2021 with  sepsis with unknown source .  Pharmacy has been consulted for cefepime and vancomycin dosing. Patient originally admitted as a stroke alert, now concern for infection. Patient febrile to 100.9 and WBC 5.7.   Plan: Start cefepime 2g every 12 hours.  Give IV Vancomycin 1500 x 1 for loading dose, followed by IV Vancomycin 1250 every 24 hours for eAUC 443.  Flagyl per MD.   Weight: 77.6 kg (171 lb 1.2 oz)  Temp (24hrs), Avg:101.6 F (38.7 C), Min:100.9 F (38.3 C), Max:102.3 F (39.1 C)  Recent Labs  Lab 10/26/21 1559 10/26/21 1605 10/26/21 1610  WBC  --  5.7  --   CREATININE  --  1.04 1.00  LATICACIDVEN 1.9  --   --     Estimated Creatinine Clearance: 55.4 mL/min (by C-G formula based on SCr of 1 mg/dL).    No Known Allergies  Thank you for allowing pharmacy to be a part of this patient's care.  Ventura Sellers 10/26/2021 6:01 PM

## 2021-10-26 NOTE — Assessment & Plan Note (Addendum)
Possible onset of symptoms Saturday with cough vs. Today with fever?  Overall feels fine, but likely contributing to confusion and weakness  -covid labs -PPE/droplet and contact precautions -no oxygen requirement and CXR with no pneumonia -covid vaccines x 3 -supportive care with anti-tussive, vitamins, xoponex PRN and molnupiravir  -CXR with cardiomegaly and central pulmonary vascular congestion and mild interstitial edema. Hx of grade 1 DD. No s/s of volume overload or CHF. BNP pending consider echo in setting of covid 19  -repeat 2V CXR in AM  -PT for weakness

## 2021-10-26 NOTE — Assessment & Plan Note (Signed)
NSR, rate controlled Continue eliquis and toprol-xl 52m daily

## 2021-10-26 NOTE — Progress Notes (Signed)
  X-cover Note: EKG shows rapid afib/flutter. Hx of afib. On eliquis  Start cardizem gtts. Replace potassium.    Kristopher Oppenheim, DO [facility]

## 2021-10-26 NOTE — ED Provider Notes (Signed)
[facility] EMERGENCY DEPARTMENT Provider Note   CSN: [id number] Arrival date & time: 10/26/21  1559     History  No chief complaint on file.   Michael [name] is a 88 y.o. male.  88 year old male with history of atrial fibrillation on Eliquis who presents to the emergency department with weakness.  History primarily obtained per the patient's daughter Michael [name] who can be reached at [phone].  She states that her father lives independently at home and drives himself and completes all of his ADLs.  Went to the gym today at 1 PM and when coming back felt very weak.  She reports that she had to help him sit down.  They tried giving him food and drink but it persisted so 911 was called.  They deny any slurred speech, focal weakness, facial droop.  Has had a dry cough recently and increased urinary frequency (per the patient) but denies any fevers, chest pain, shortness of breath, diarrhea, nausea or vomiting.  No known sick contacts.  Does have a history of TIAs.  Last took his Eliquis this morning.  Lives at home alone since his daughter is taking care of his wife at this time.  EMS did call code stroke for aphasia on arrival.  Patient reports dry cough and increased urinary frequency but denies any other symptoms at this time.    Past Medical History:  Diagnosis Date   Adrenal insufficiency (HCC)    Anemia    Atrial fibrillation (HCC)    Avascular necrosis ([place])    Clavicle fracture 11/02/2017   Colon polyp    Colon polyps    Crohn's disease ([place])    Glaucoma    Inguinal hernia recurrent bilateral    Low testosterone    Osteopenia    Retinal ischemia       Home Medications Prior to Admission medications   Medication Sig Start Date End Date Taking? Authorizing Provider  acetaminophen (TYLENOL) 500 MG tablet Take 500-1,000 mg by mouth every 8 (eight) hours as needed for mild pain or headache.     [provider]  amoxicillin (AMOXIL) 500 MG  capsule Take 500 mg by mouth 3 (three) times daily. 12/29/20   [provider]  apixaban (ELIQUIS) 5 MG TABS tablet Take 1 tablet (5 mg total) by mouth 2 (two) times daily. 09/06/21   Burnell Blanks, MD  Ascorbic Acid (VITAMIN C) 1000 MG tablet Take 1,000 mg by mouth daily with breakfast.     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  10/03/17   [provider]  brimonidine (ALPHAGAN) 0.2 % ophthalmic solution Place 1 drop into the left eye in the morning and at bedtime. Instill 1 drop into the left eye twice a day- 6:45 AM and 5:50 PM 11/20/15   [provider]  calcium carbonate (CALCIUM 600) 600 MG TABS tablet Take 1 tablet (600 mg total) by mouth daily. 09/29/20   Binnie Rail, MD  Cholecalciferol (VITAMIN D3) 50 MCG (2000 UT) TABS Take 2,000 Units by mouth daily with breakfast.    [provider]  cyanocobalamin (,VITAMIN B-12,) 1000 MCG/ML injection INJECT 1ML INTO THE MUSCLE ONCE EVERY THIRTY DAYS 01/15/21   Binnie Rail, MD  desmopressin (DDAVP) 0.1 MG tablet Take 0.1 mg by mouth at bedtime.    [provider]  dorzolamide-timolol (COSOPT) 22.3-6.8 MG/ML ophthalmic solution Place 1  drop into the left eye 2 (two) times daily. Instill 1 drop into the left eye twice a day- 6:35 AM and 5:30 PM 11/05/15   [provider]  ferrous sulfate 325 (65 FE) MG tablet Take 325 mg by mouth 3 (three) times a week. In the morning    [provider]  latanoprost (XALATAN) 0.005 % ophthalmic solution Place 1 drop into the left eye at bedtime. Instill 1 drop into the left eye at 6:10 PM daily 09/12/17   [provider]  loperamide (IMODIUM A-D) 2 MG tablet Take 2 mg by mouth daily.    [provider]  metoprolol succinate (TOPROL-XL) 25 MG 24 hr tablet Take one-half tablet by  mouth at bedtime 11/10/20    Burnell Blanks, MD  Netarsudil Dimesylate 0.02 % SOLN Place 1 drop into the left eye at bedtime. Instill 1 drop into the left eye at 6:15 PM daily    [provider]  omeprazole (PRILOSEC OTC) 20 MG tablet Take 20 mg by mouth daily before breakfast.    [provider]  Syringe/Needle, Disp, (SYRINGE 3CC/25GX1") 25G X 1" 3 ML MISC Use for monthly B12 injections 11/13/19   Burns, Claudina Lick, MD  tamsulosin (FLOMAX) 0.4 MG CAPS capsule Take 0.4 mg by mouth at bedtime. 12/20/19   [provider]      Allergies    Patient has no known allergies.    Review of Systems   Review of Systems  Physical Exam Updated Vital Signs There were no vitals taken for this visit. Physical Exam Vitals and nursing note reviewed.  Constitutional:      General: He is not in acute distress.    Appearance: He is well-developed.  HENT:     Head: Normocephalic and atraumatic.     Right Ear: External ear normal.     Left Ear: External ear normal.     Nose: Nose normal.  Eyes:     Extraocular Movements: Extraocular movements intact.     Conjunctiva/sclera: Conjunctivae normal.     Pupils: Pupils are equal, round, and reactive to light.  Cardiovascular:     Rate and Rhythm: Tachycardia present. Rhythm irregular.     Heart sounds: Normal heart sounds.  Pulmonary:     Effort: Pulmonary effort is normal. No respiratory distress.     Breath sounds: Normal breath sounds.  Abdominal:     General: There is no distension.     Palpations: Abdomen is soft. There is no mass.     Tenderness: There is no abdominal tenderness. There is no guarding.  Musculoskeletal:        General: No swelling.     Cervical back: Normal range of motion and neck supple.     Right lower leg: No edema.     Left lower leg: No edema.  Skin:    General: Skin is warm and dry.     Capillary Refill: Capillary refill takes less than 2 seconds.  Neurological:     Mental Status: He is alert.     Comments:  Pupils 3 mm and reactive bilaterally.  Cranial nerves II through XII grossly intact.  Does have difficulty with naming and describing pictures on NIH stroke scale but has fluent speech otherwise and is responding appropriately to questions.  Full strength and intact sensation to light touch in bilateral upper extremities.  Did have drift of left lower extremity but full strength of right lower extremity.  Psychiatric:  Mood and Affect: Mood normal.        Behavior: Behavior normal.     ED Results / Procedures / Treatments   Labs (all labs ordered are listed, but only abnormal results are displayed) Labs Reviewed  I-STAT CHEM 8, ED - Abnormal; Notable for the following components:      Result Value   Potassium 3.2 (*)    Glucose, Bld 108 (*)    Calcium, Ion 1.08 (*)    Hemoglobin 11.2 (*)    HCT 33.0 (*)    All other components within normal limits  RESP PANEL BY RT-PCR (FLU A&B, COVID) ARPGX2  CULTURE, BLOOD (ROUTINE X 2)  CULTURE, BLOOD (ROUTINE X 2)  URINE CULTURE  PROTIME-INR  APTT  CBC  DIFFERENTIAL  COMPREHENSIVE METABOLIC PANEL  ETHANOL  LACTIC ACID, PLASMA  LACTIC ACID, PLASMA  URINALYSIS, ROUTINE W REFLEX MICROSCOPIC  CBG MONITORING, ED  CBG MONITORING, ED    EKG None  Radiology CT HEAD CODE STROKE WO CONTRAST  Result Date: 10/26/2021 CLINICAL DATA:  Code stroke.  Acute neuro deficit.  Aphasia EXAM: CT HEAD WITHOUT CONTRAST TECHNIQUE: Contiguous axial images were obtained from the base of the skull through the vertex without intravenous contrast. RADIATION DOSE REDUCTION: This exam was performed according to the departmental dose-optimization program which includes automated exposure control, adjustment of the mA and/or kV according to patient size and/or use of iterative reconstruction technique. COMPARISON:  CT head 09/24/2020 FINDINGS: Brain: No evidence of acute infarction, hemorrhage, hydrocephalus, extra-axial collection or mass lesion/mass effect.  Generalized atrophy. Patchy white matter hypodensity bilaterally is chronic and unchanged. Hypodensity left thalamus unchanged and consistent with chronic infarct. Vascular: Negative for hyperdense vessel Skull: No focal lesion. Sinuses/Orbits: Mucosal edema and bony thickening right maxillary sinus. Adjacent lucency in the right maxilla may be related to dental infection. Periapical lucency around left upper premolar with mild mucosal edema left maxillary sinus. Bilateral cataract extraction Other: None ASPECTS ([place] Stroke Program Early CT Score) - Ganglionic level infarction (caudate, lentiform nuclei, internal capsule, insula, M1-M3 cortex): 7 - Supraganglionic infarction (M4-M6 cortex): 3 Total score (0-10 with 10 being normal): 10 IMPRESSION: 1. No acute intracranial abnormality. Atrophy and chronic microvascular ischemia. 2. Aspects is 10. 3. Code stroke imaging results were communicated on 10/26/2021 at 4:21 pm to provider Michael [name] via secure text message Electronically Signed   By: Franchot Gallo M.D.   On: 10/26/2021 16:22    Procedures Procedures   Medications Ordered in ED Medications  sodium chloride flush (NS) 0.9 % injection 3 mL (has no administration in time range)  iohexol (OMNIPAQUE) 350 MG/ML injection 75 mL (75 mLs Intravenous Contrast Given 10/26/21 1613)    ED Course/ Medical Decision Making/ A&P Clinical Course as of 10/26/21 1853  Tue Oct 26, 2021  1646 Dr Michael [name] recommends MRI.  [RP]  1852 Dr Rogers Blocker from hospitalist has called and will admit the patient. [RP]    Clinical Course User Index [RP] Fransico Meadow, MD                           Medical Decision Making Amount and/or Complexity of Data Reviewed Labs: ordered. Radiology: ordered.  Risk OTC drugs. Decision regarding hospitalization.   BJORN HALLAS is a 88 y.o. male with comorbidities that complicate the patient evaluation including atrial fibrillation on Eliquis and TIA who presents  with chief complaint of weakness.  This patient presents to the ED for concern  of complaints listed in HPI, this involves an extensive number of treatment options, and is a complaint that carries with it a high risk of complications and morbidity.   Initial Ddx:  Stroke, generalized encephalopathy, urinary tract infection, pneumonia, encephalitis/meningitis  MDM:  Initially was concerned about stroke given the patient's aphasia and lower extremity weakness.  Appeared to have a fluctuating exam as his speech appeared to clear up during his stay in the emergency department.  Additional history obtained per his daughter paints a picture of generalized encephalopathy and weakness rather than a stroke but we will work the patient up for stroke since he does have a history of TIA in the past.  Feel that if his stroke work-up is negative he likely has generalized encephalopathy from infection such as UTI or pneumonia given his cough.  Did consider encephalitis and meningitis but he has a supple neck and is not complaining of a headache or neck pain at this time.  Plan:  Labs Urinalysis Blood cultures Lactate COVID/flu Chest x-ray CTA head and neck stroke protocol Stroke activation  ED Summary:  Patient underwent stroke work-up.  Dr. Leonel [name] from neurology evaluated the patient and his CTA head and neck did not reveal evidence of LVO.  Patient not a candidate for tPA given his Eliquis use.  Patient was noted to be febrile and tested positive for COVID.  He remained on room air but was mildly tachypneic.  Given the fact that he lives at home patient will be admitted for observation and remainder of his stroke work-up including MRI.  Discussed antivirals with pharmacy who recommended molnupiravir.  Dispo: Admit to Floor   Additional history obtained from daughter Records reviewed Care Everywhere The following labs were independently interpreted: Chemistry and show  hypokalemia and  hypocalcemia I independently reviewed the following imaging with scope of interpretation limited to determining acute life threatening conditions related to emergency care: CT Head, which revealed no acute abnormality  I personally reviewed and interpreted cardiac monitoring: normal sinus rhythm  I personally reviewed and interpreted the pt's EKG: see above for interpretation  I have reviewed the patients home medications and made adjustments as needed Consults: Neurology Social Determinants of health:  Elderly who lives at home alone  Final Clinical Impression(s) / ED Diagnoses Final diagnoses:  None    Rx / DC Orders ED Discharge Orders     None      CRITICAL CARE Performed by: Fransico Meadow   Total critical care time: 45 minutes  Critical care time was exclusive of separately billable procedures and treating other patients.  Critical care was necessary to treat or prevent imminent or life-threatening deterioration.  Critical care was time spent personally by me on the following activities: development of treatment plan with patient and/or surrogate as well as nursing, discussions with consultants, evaluation of patient's response to treatment, examination of patient, obtaining history from patient or surrogate, ordering and performing treatments and interventions, ordering and review of laboratory studies, ordering and review of radiographic studies, pulse oximetry and re-evaluation of patient's condition.    Fransico Meadow, MD 10/26/21 [phone]

## 2021-10-26 NOTE — Assessment & Plan Note (Addendum)
b12 of 243 in July 2023  Repeat level pending with AMS

## 2021-10-26 NOTE — Assessment & Plan Note (Signed)
Secondary to steroid use for chron's disease

## 2021-10-26 NOTE — Code Documentation (Addendum)
Michael [name] is an 88 yr old male arriving to [facility] via EMS on 10/26/2021. He has a past medical history of A Fib. He is on Eliquis. Pt is from home where he was last known well at 1245 when his wife saw him leave for the gym. When he returned from gym, he was unable to walk from his car to his house, so his wife called EMS. EMS noted no weakness, but did find aphasia, so Code Stroke was activated.    Stroke team at bridge on pt's arrival. Labs and CBG obtained. Airway cleared by EDP. Pt taken to CT scanner with team. NIH 3 at this time (please see documentation for details and times). Pt with aphasia, cannot name current month, and has drift of Left leg.    Non-contrast CTnegative for acute hemorrhage per Dr. Leonel Ramsay. CTA obtained. CTA negative for LVO per Dr. Leonel Ramsay. Pt is not a candidate for thrombolysis due to Eliquis. He is not eligible for mechanical thrombectomy as he is LVO negative.     Pt returned to room 25 where his workup will continue. He will need q 2 hr VS and NIHSS. Bedside handoff with [facility].

## 2021-10-26 NOTE — Assessment & Plan Note (Signed)
Followed by Dr. Havery Moros No evidence of flair  budesonide as needed

## 2021-10-26 NOTE — Assessment & Plan Note (Addendum)
MRI pending to r/o CVA Continue eliquis LDL has been to goal off statins (53 in 07/2021)

## 2021-10-26 NOTE — Assessment & Plan Note (Signed)
Continue iron

## 2021-10-26 NOTE — Assessment & Plan Note (Addendum)
88 year old male presenting with acute confusion and weakness in setting covid-19 -obs to telemetry -likely secondary to infection from covid-19 +fever  -UA clear, CXR with no pneumonia  -head CT with no acute findings -initially code stroke was called due to some concern for aphasia on arrival; however this has resolved. With hx of TIA and in setting of aphasia neuro ordered brain MRI. If positive will pursue stroke work up, but low suspicion at this point -will check metabolic labs: TSH +T62 with hx of B12 deficiency  -covid-19 with no oxygen requirement, supportive therapy see below -anti-pyretics to control fever  -appears back to baseline. Alert and oriented and answered all questions appropriately  -received 1L IVF in ED, gentle time limited IVF x 6 hours with continued fever and dry on exam

## 2021-10-26 NOTE — Consult Note (Addendum)
Neurology Consultation  Reason for Consult: Code stroke Referring Physician: Dr. Philip Aspen  CC: aphasia and imbalance   History is obtained from:EMS, medical record  HPI: Michael [name] is a 88 y.o. male with past medical history of A fib on Eliquis, Crohns, memory impairment, TIA, B12 deficiency, TIA, gluacoma who presents today via EMS as a Code stroke for acute confusion and imbalance issues. LKW 1245. He went to the gym and came back and wife noted him to be confused and unable to comprehend and balance issues.  CT head with no acute abnormality, ASPECTS 10. CTA head and neck negative for LVO  With EMS HR was A fib 80-140's and was given IVF. He is constantly c/o having to urinate. Temp in the ED is 102.3 orally. BP 153/101, HR 106  LKW: 1245 IV thrombolysis given?: no, on Eliquis Low NIHSS  Premorbid modified Rankin scale (mRS):  0-Completely asymptomatic and back to baseline post-stroke  ROS:  Unable to obtain due to altered mental status.   Past Medical History:  Diagnosis Date   Adrenal insufficiency (HCC)    Anemia    Atrial fibrillation (HCC)    Avascular necrosis (HCC)    Clavicle fracture 11/02/2017   Colon polyp    Colon polyps    Crohn's disease ([place])    Glaucoma    Inguinal hernia recurrent bilateral    Low testosterone    Osteopenia    Retinal ischemia      Family History  Problem Relation Age of Onset   Deep vein thrombosis Mother    Transient ischemic attack Mother    Heart disease Father    Cancer Maternal Aunt        unknown type; dx after 45   Cancer Cousin        maternal cousin; unknown type   Cancer Cousin        paternal cousin; unknown type; dx after 38   Stomach cancer Neg Hx    Colon cancer Neg Hx    Esophageal cancer Neg Hx    Pancreatic cancer Neg Hx    Rectal cancer Neg Hx    Colon polyps Neg Hx      Social History:   reports that he quit smoking about 45 years ago. His smoking use included cigarettes. He has never used  smokeless tobacco. He reports current alcohol use of about 2.0 standard drinks of alcohol per week. He reports that he does not use drugs.  Medications  Current Facility-Administered Medications:    sodium chloride flush (NS) 0.9 % injection 3 mL, 3 mL, Intravenous, Once, Fransico Meadow, MD  Current Outpatient Medications:    acetaminophen (TYLENOL) 500 MG tablet, Take 500-1,000 mg by mouth every 8 (eight) hours as needed for mild pain or headache. , Disp: , Rfl:    amoxicillin (AMOXIL) 500 MG capsule, Take 500 mg by mouth 3 (three) times daily., Disp: , Rfl:    apixaban (ELIQUIS) 5 MG TABS tablet, Take 1 tablet (5 mg total) by mouth 2 (two) times daily., Disp: 60 tablet, Rfl: 5   Ascorbic Acid (VITAMIN C) 1000 MG tablet, Take 1,000 mg by mouth daily with breakfast. , Disp: , Rfl:    augmented betamethasone dipropionate (DIPROLENE-AF) 0.05 % cream, Apply 1 application topically 2 (two) times daily as needed (Grover's disease). , Disp: , Rfl:    brimonidine (ALPHAGAN) 0.2 % ophthalmic solution, Place 1 drop into the left eye in the morning and at bedtime. Instill 1 drop  into the left eye twice a day- 6:45 AM and 5:50 PM, Disp: , Rfl:    calcium carbonate (CALCIUM 600) 600 MG TABS tablet, Take 1 tablet (600 mg total) by mouth daily., Disp: 30 tablet, Rfl:    Cholecalciferol (VITAMIN D3) 50 MCG (2000 UT) TABS, Take 2,000 Units by mouth daily with breakfast., Disp: , Rfl:    cyanocobalamin (,VITAMIN B-12,) 1000 MCG/ML injection, INJECT 1ML INTO THE MUSCLE ONCE EVERY THIRTY DAYS, Disp: 10 mL, Rfl: 0   desmopressin (DDAVP) 0.1 MG tablet, Take 0.1 mg by mouth at bedtime., Disp: , Rfl:    dorzolamide-timolol (COSOPT) 22.3-6.8 MG/ML ophthalmic solution, Place 1 drop into the left eye 2 (two) times daily. Instill 1 drop into the left eye twice a day- 6:35 AM and 5:30 PM, Disp: , Rfl:    ferrous sulfate 325 (65 FE) MG tablet, Take 325 mg by mouth 3 (three) times a week. In the morning, Disp: , Rfl:     latanoprost (XALATAN) 0.005 % ophthalmic solution, Place 1 drop into the left eye at bedtime. Instill 1 drop into the left eye at 6:10 PM daily, Disp: , Rfl:    loperamide (IMODIUM A-D) 2 MG tablet, Take 2 mg by mouth daily., Disp: , Rfl:    metoprolol succinate (TOPROL-XL) 25 MG 24 hr tablet, Take one-half tablet by  mouth at bedtime, Disp: 45 tablet, Rfl: 3   Netarsudil Dimesylate 0.02 % SOLN, Place 1 drop into the left eye at bedtime. Instill 1 drop into the left eye at 6:15 PM daily, Disp: , Rfl:    omeprazole (PRILOSEC OTC) 20 MG tablet, Take 20 mg by mouth daily before breakfast., Disp: , Rfl:    Syringe/Needle, Disp, (SYRINGE 3CC/25GX1") 25G X 1" 3 ML MISC, Use for monthly B12 injections, Disp: 12 each, Rfl: 0   tamsulosin (FLOMAX) 0.4 MG CAPS capsule, Take 0.4 mg by mouth at bedtime., Disp: , Rfl:    Exam: Current vital signs: There were no vitals taken for this visit. Vital signs in last 24 hours:    GENERAL: Awake, alert in NAD HEENT: - Normocephalic and atraumatic, dry mm LUNGS - Clear to auscultation bilaterally with no wheezes CV - S1S2 RRR, no m/r/g, equal pulses bilaterally. ABDOMEN - Soft, nontender, nondistended with normoactive BS Ext: warm, well perfused, intact peripheral pulses, no edema  NEURO:  Mental Status: AA&Ox1 to self Language: speech is clear.  Unable to name objects or tell complete  Cranial Nerves: PERRL EOMI, visual fields full, no facial asymmetry, facial sensation intact, hearing intact, tongue/uvula/soft palate midline, normal sternocleidomastoid and trapezius muscle strength. No evidence of tongue atrophy or fibrillations Motor: bilateral upper 5/5, left lower 4/5, right 5/5 Tone: is normal and bulk is normal Sensation- Intact to light touch bilaterally Coordination: FTN intact bilaterally, no ataxia in BLE. Gait- deferred  NIHSS 1a Level of Conscious.: 0 1b LOC Questions: 1 1c LOC Commands: 0 2 Best Gaze: 0 3 Visual: 0 4 Facial Palsy: 0 5a  Motor Arm - left: 0 5b Motor Arm - Right: 0 6a Motor Leg - Left: 1 6b Motor Leg - Right: 0 7 Limb Ataxia: 0 8 Sensory: 0 9 Best Language: 1 10 Dysarthria: 0 11 Extinct. and Inatten.: 0 TOTAL: 3   Labs I have reviewed labs in epic and the results pertinent to this consultation are:  CBC    Component Value Date/Time   WBC 7.2 07/27/2021 1207   RBC 3.77 (L) 07/27/2021 1207   HGB 11.2 (  L) 10/26/2021 1610   HCT 33.0 (L) 10/26/2021 1610   PLT 194.0 07/27/2021 1207   MCV 91.5 07/27/2021 1207   MCH 32.2 09/23/2020 0849   MCHC 34.6 07/27/2021 1207   RDW 16.2 (H) 07/27/2021 1207   LYMPHSABS 1.7 07/27/2021 1207   MONOABS 0.7 07/27/2021 1207   EOSABS 0.1 07/27/2021 1207   BASOSABS 0.1 07/27/2021 1207    CMP     Component Value Date/Time   NA 136 10/26/2021 1610   NA 138 10/04/2016 0000   K 3.2 (L) 10/26/2021 1610   CL 98 10/26/2021 1610   CO2 28 07/27/2021 1207   GLUCOSE 108 (H) 10/26/2021 1610   BUN 12 10/26/2021 1610   BUN 13 10/04/2016 0000   CREATININE 1.00 10/26/2021 1610   CALCIUM 9.2 07/27/2021 1207   PROT 7.2 07/27/2021 1207   ALBUMIN 4.0 07/27/2021 1207   AST 14 07/27/2021 1207   ALT 11 07/27/2021 1207   ALKPHOS 57 07/27/2021 1207   BILITOT 0.9 07/27/2021 1207   GFRNONAA >60 09/23/2020 0849   GFRAA >60 09/11/2019 1315    Lipid Panel     Component Value Date/Time   CHOL 118 07/27/2021 1207   TRIG 176.0 (H) 07/27/2021 1207   HDL 33.10 (L) 07/27/2021 1207   CHOLHDL 4 07/27/2021 1207   VLDL 35.2 07/27/2021 1207   LDLCALC 50 07/27/2021 1207   LDLDIRECT 60.6 09/11/2019 1401     Imaging I have reviewed the images obtained:  CT-head No acute intracranial abnormality. Atrophy and chronic microvascular ischemia. Aspects is 10.  CTA head and neck  Negative for intracranial large vessel occlusion or significant stenosis. Mild atherosclerotic disease in the carotid bifurcation bilaterally. No significant carotid or vertebral artery stenosis in the  neck  Assessment:  Michael [name] is a 88 y.o. male with past medical history of A fib on Eliquis, Crohns, memory impairment, TIA, B12 deficiency, TIA, gluacoma who presents today via EMS as a Code stroke for acute confusion and imbalance issues.  Impression: Most likely this is an acute infection due to patient being febrile @ 102.48F and less likely Stroke or TIA Hypokalemia   Recommendations: - Infectious workup with UA, CXR, CBC, procal, lactic acid, resp panel  - replete K  - MRI to eval for stoke. If MRI is positive will pursue full stroke workup.   Beulah Gandy DNP, [facility]   I have seen the patient reviewed the above note.  He is confused, no meningismus or other signs of CNS infection, I do suspect that this is likely encephalopathy secondary to infection.  At this time, given there is some concern for speech, I think an MRI is reasonable, but if this is negative then I would pursue infectious work-up/treatment per ED/internal medicine.  Roland Rack, MD [facility] [phone]  If 7pm- 7am, please page neurology on call as listed in [place].

## 2021-10-26 NOTE — ED Notes (Signed)
Patient sitting up in bed eating dinner tray. Resting comfortably at this time.

## 2021-10-26 NOTE — ED Triage Notes (Signed)
Pt last seem well at 1245, going to the gym. Pt returned home and could not get out of the car. Code stroke activated at 1559. Pt takes Eliquis. Pt is aphasic and right leg drift. Pt is also c/o needing to urinate

## 2021-10-26 NOTE — H&P (Addendum)
History and Physical    Patient: Michael [name] DXA:[id number] DOB: 08-Jun-1932 DOA: 10/26/2021 DOS: the patient was seen and examined on 10/26/2021 PCP: Binnie Rail, MD  Patient coming from: Home - lives with his wife. He uses a walker at times, but hasn't needed anything lately.    Chief Complaint: AMS  HPI: Michael [name] is a 88 y.o. male with medical history significant of atrial fibrillation on eliquis, HTN, Chron's disease, memory impairment, TIA, hx of DVT in 2019 who presented to ED with AMS. He was fine all day according to his wife. He did his normal routine and went to the gym at 1:00pm. When he got home he couldn't walk from the door to the chair, which is abnormal for him. He wasn't comprehending. He couldn't feed himself crackers, he would just stare at them.  They called his daughter at 88. She got there and had no focal deficits. When EMS arrived he couldn't lift his arms. His speech was fine per the daughter. He wouldn't follow commands. They deny any respiratory symptoms except a mild cough on Saturday.    He tells me he was able to get out of his car. He also told me he had worked on his legs at the gym with his trainer.  His only complaint is a mild dry cough, but denies any other respiratory symptom. He states he has been urinating more frequently, but this is a chronic issue for him, (BPH and OAB).   He has been feeling good. Denies any fever/chills, vision changes/headaches, chest pain or palpitations, shortness of breath,  abdominal pain, N/V/D, dysuria or leg swelling.    He does not smoke. He drinks socially.  He has had 3 covid vaccines.   ER Course:  vitals: temp: 102.3, bp: 153/101, HR: 106, RR: 20, oxygen: 98%RA Pertinent labs: hgb: 10.6, potassium: 3.2, covid positive,  CXR: cardiomegaly with central pulmonary vascular congestion and mild interstitial edema CT head: no acute finding CTA head/neck: no LVO.  In ED: given 1L IVF bolus. BC obtained.     Review of Systems: As mentioned in the history of present illness. All other systems reviewed and are negative. Past Medical History:  Diagnosis Date   Adrenal insufficiency (HCC)    Anemia    Atrial fibrillation (HCC)    Avascular necrosis (HCC)    Clavicle fracture 11/02/2017   Colon polyp    Colon polyps    Crohn's disease ([place])    Glaucoma    Inguinal hernia recurrent bilateral    Low testosterone    Osteopenia    Retinal ischemia    Past Surgical History:  Procedure Laterality Date   ABDOMINAL SURGERY     BIOPSY  03/19/2020   Procedure: BIOPSY;  Surgeon: Yetta Flock, MD;  Location: [facility];  Service: Gastroenterology;;   COLONOSCOPY N/A 12/13/2015   Procedure: COLONOSCOPY;  Surgeon: Laurence Spates, MD;  Location: [facility];  Service: Endoscopy;  Laterality: N/A;   COLONOSCOPY WITH PROPOFOL N/A 03/25/2019   Procedure: COLONOSCOPY WITH PROPOFOL;  Surgeon: Yetta Flock, MD;  Location: [facility];  Service: Gastroenterology;  Laterality: N/A;   COLONOSCOPY WITH PROPOFOL N/A 08/26/2019   Procedure: COLONOSCOPY WITH PROPOFOL;  Surgeon: Yetta Flock, MD;  Location: [facility];  Service: Gastroenterology;  Laterality: N/A;   COLONOSCOPY WITH PROPOFOL N/A 03/19/2020   Procedure: COLONOSCOPY WITH PROPOFOL;  Surgeon: Yetta Flock, MD;  Location: [facility];  Service: Gastroenterology;  Laterality: N/A;   ENDOSCOPIC  MUCOSAL RESECTION N/A 08/26/2019   Procedure: ENDOSCOPIC MUCOSAL RESECTION;  Surgeon: Yetta Flock, MD;  Location: [facility];  Service: Gastroenterology;  Laterality: N/A;   INGUINAL HERNIA REPAIR Bilateral 05/21/2014   Procedure: OPEN BILATERAL INGUINAL HERNIA REPAIRS WITH MESH;  Surgeon: Donnie Mesa, MD;  Location: [facility];  Service: General;  Laterality: Bilateral;   POLYPECTOMY  03/25/2019   Procedure: POLYPECTOMY;  Surgeon: Yetta Flock, MD;  Location: [facility];  Service:  Gastroenterology;;   POLYPECTOMY  08/26/2019   Procedure: POLYPECTOMY;  Surgeon: Yetta Flock, MD;  Location: [facility];  Service: Gastroenterology;;   POLYPECTOMY  03/19/2020   Procedure: POLYPECTOMY;  Surgeon: Yetta Flock, MD;  Location: [facility];  Service: Gastroenterology;;   Woodward Ku  08/26/2019   Procedure: Sprayed methylene blue;  Surgeon: Yetta Flock, MD;  Location: [facility];  Service: Gastroenterology;;   SMALL INTESTINE SURGERY  1968, 2001    related to Chrohn's disease   TONSILLECTOMY     Social History:  reports that he quit smoking about 45 years ago. His smoking use included cigarettes. He has never used smokeless tobacco. He reports current alcohol use of about 2.0 standard drinks of alcohol per week. He reports that he does not use drugs.  No Known Allergies  Family History  Problem Relation Age of Onset   Deep vein thrombosis Mother    Transient ischemic attack Mother    Heart disease Father    Cancer Maternal Aunt        unknown type; dx after 63   Cancer Cousin        maternal cousin; unknown type   Cancer Cousin        paternal cousin; unknown type; dx after 33   Stomach cancer Neg Hx    Colon cancer Neg Hx    Esophageal cancer Neg Hx    Pancreatic cancer Neg Hx    Rectal cancer Neg Hx    Colon polyps Neg Hx     Prior to Admission medications   Medication Sig Start Date End Date Taking? Authorizing Provider  acetaminophen (TYLENOL) 500 MG tablet Take 500-1,000 mg by mouth every 8 (eight) hours as needed for mild pain or headache.    Yes [provider]  apixaban (ELIQUIS) 5 MG TABS tablet Take 1 tablet (5 mg total) by mouth 2 (two) times daily. 09/06/21  Yes Burnell Blanks, MD  Ascorbic Acid (VITAMIN C) 1000 MG tablet Take 1,000 mg by mouth daily with breakfast.    Yes [provider]  augmented betamethasone dipropionate (DIPROLENE-AF) 0.05 % cream Apply 1 application topically 2 (two) times  daily as needed (Grover's disease).  10/03/17  Yes [provider]  brimonidine (ALPHAGAN) 0.2 % ophthalmic solution Place 1 drop into the left eye in the morning and at bedtime. Instill 1 drop into the left eye twice a day- 6:45 AM and 5:50 PM 11/20/15  Yes [provider]  calcium carbonate (CALCIUM 600) 600 MG TABS tablet Take 1 tablet (600 mg total) by mouth daily. 09/29/20  Yes Burns, Claudina Lick, MD  Cholecalciferol (VITAMIN D3) 50 MCG (2000 UT) TABS Take 2,000 Units by mouth daily with breakfast.   Yes [provider]  cyanocobalamin (,VITAMIN B-12,) 1000 MCG/ML injection INJECT 1ML INTO THE MUSCLE ONCE EVERY THIRTY DAYS Patient taking differently: Inject 1,000 mcg into the skin every 30 (thirty) days. 01/15/21  Yes Burns, Claudina Lick, MD  dorzolamide-timolol (COSOPT) 22.3-6.8 MG/ML ophthalmic solution Place  1 drop into the left eye 2 (two) times daily. 11/05/15  Yes [provider]  ferrous sulfate 325 (65 FE) MG tablet Take 325 mg by mouth 3 (three) times a week. In the morning   Yes [provider]  latanoprost (XALATAN) 0.005 % ophthalmic solution Place 1 drop into the left eye at bedtime. 09/12/17  Yes [provider]  loperamide (IMODIUM A-D) 2 MG tablet Take 2 mg by mouth daily as needed for diarrhea or loose stools.   Yes [provider]  metoprolol succinate (TOPROL-XL) 25 MG 24 hr tablet Take one-half tablet by  mouth at bedtime Patient taking differently: Take 12.5 mg by mouth daily. 11/10/20  Yes Burnell Blanks, MD  Netarsudil Dimesylate 0.02 % SOLN Place 1 drop into the left eye at bedtime.   Yes [provider]  omeprazole (PRILOSEC OTC) 20 MG tablet Take 20 mg by mouth daily before breakfast.   Yes [provider]  tamsulosin (FLOMAX) 0.4 MG CAPS capsule Take 0.4 mg by mouth at bedtime. 12/20/19  Yes [provider]  desmopressin (DDAVP) 0.1 MG tablet Take 0.1 mg by mouth at bedtime. Patient not  taking: Reported on 10/26/2021    [provider]  Syringe/Needle, Disp, (SYRINGE 3CC/25GX1") 25G X 1" 3 ML MISC Use for monthly B12 injections 11/13/19   Binnie Rail, MD    Physical Exam: Vitals:   10/26/21 1815 10/26/21 1830 10/26/21 1845 10/26/21 1926  BP: (!) 134/94 130/80 (!) 138/100 124/85  Pulse: 96 91 95 90  Resp: (!) 29 (!) 25 17 20   Temp:      TempSrc:      SpO2: 96% 95% 95% 95%  Weight:       General:  Appears calm and comfortable and is in NAD Eyes:  PERRL, EOMI, normal lids, iris ENT:  HOH,  lips & tongue, dry mucous membranes; appropriate dentition Neck:  no LAD, masses or thyromegaly; no carotid bruits Cardiovascular:  RRR, no m/r/g. No LE edema.  Respiratory:   CTA bilaterally with no wheezes/rales/rhonchi.  Normal respiratory effort. Abdomen:  soft, NT, ND, NABS Back:   normal alignment, no CVAT Skin:  no rash or induration seen on limited exam. +skin tenting  Musculoskeletal:  grossly normal tone BUE/BLE, good ROM, no bony abnormality Lower extremity:  No LE edema.  Limited foot exam with no ulcerations.  2+ distal pulses. Psychiatric:  grossly normal mood and affect, speech fluent and appropriate, AOx3 Neurologic:  CN 2-12 grossly intact, moves all extremities in coordinated fashion, sensation intact. HTK intact bilaterally. FTN intact bilaterally. DTR 2+     Radiological Exams on Admission: Independently reviewed - see discussion in A/P where applicable  DG Chest Port 1 View  Result Date: 10/26/2021 CLINICAL DATA:  Questionable sepsis EXAM: PORTABLE CHEST 1 VIEW COMPARISON:  Chest x-ray 11/15/2015 FINDINGS: Heart is mildly enlarged. There central pulmonary vascular congestion with central interstitial prominence diffusely. There is no lung consolidation, pleural effusion or pneumothorax. No acute fractures are seen. IMPRESSION: Cardiomegaly with central pulmonary vascular congestion and mild interstitial edema. Electronically Signed   By: Ronney Asters M.D.   On: 10/26/2021 17:27   CT ANGIO HEAD NECK W WO CM (CODE STROKE)  Result Date: 10/26/2021 CLINICAL DATA:  Acute neuro deficit rule out stroke.  Aphasia. EXAM: CT ANGIOGRAPHY HEAD AND NECK TECHNIQUE: Multidetector CT imaging of the head and neck was performed using the standard protocol during bolus administration of intravenous contrast. Multiplanar CT image  reconstructions and MIPs were obtained to evaluate the vascular anatomy. Carotid stenosis measurements (when applicable) are obtained utilizing NASCET criteria, using the distal internal carotid diameter as the denominator. RADIATION DOSE REDUCTION: This exam was performed according to the departmental dose-optimization program which includes automated exposure control, adjustment of the mA and/or kV according to patient size and/or use of iterative reconstruction technique. CONTRAST:  60 mL OMNIPAQUE IOHEXOL 350 MG/ML SOLN COMPARISON:  CT head 10/26/2021 FINDINGS: CTA NECK FINDINGS Aortic arch: Aortic arch incompletely evaluated. Proximal great vessels widely patent. Right carotid system: Mild atherosclerotic calcification right carotid bifurcation. Negative for stenosis Left carotid system: Mild atherosclerotic calcification left carotid stenosis. Negative for stenosis Vertebral arteries: Both vertebral arteries patent to the basilar without stenosis Skeleton: Cervical spondylosis without acute skeletal abnormality. Other neck: Negative for mass or soft tissue swelling Upper chest: Apical emphysema and probable scarring. No acute abnormality Review of the MIP images confirms the above findings CTA HEAD FINDINGS Anterior circulation: Internal carotid artery widely patent through the skull base and cavernous segment bilaterally. Minimal atherosclerotic disease in the cavernous carotid bilaterally. Anterior and middle cerebral arteries normal without stenosis or aneurysm Posterior circulation: Both vertebral arteries patent to the basilar.  PICA patent bilaterally. PICA patent. AICA, superior cerebellar, posterior cerebral arteries patent without stenosis or aneurysm. Venous sinuses: Normal venous enhancement Anatomic variants: None Review of the MIP images confirms the above findings IMPRESSION: Negative for intracranial large vessel occlusion or significant stenosis. Mild atherosclerotic disease in the carotid bifurcation bilaterally. No significant carotid or vertebral artery stenosis in the neck. Electronically Signed   By: Franchot Gallo M.D.   On: 10/26/2021 16:33   CT HEAD CODE STROKE WO CONTRAST  Result Date: 10/26/2021 CLINICAL DATA:  Code stroke.  Acute neuro deficit.  Aphasia EXAM: CT HEAD WITHOUT CONTRAST TECHNIQUE: Contiguous axial images were obtained from the base of the skull through the vertex without intravenous contrast. RADIATION DOSE REDUCTION: This exam was performed according to the departmental dose-optimization program which includes automated exposure control, adjustment of the mA and/or kV according to patient size and/or use of iterative reconstruction technique. COMPARISON:  CT head 09/24/2020 FINDINGS: Brain: No evidence of acute infarction, hemorrhage, hydrocephalus, extra-axial collection or mass lesion/mass effect. Generalized atrophy. Patchy white matter hypodensity bilaterally is chronic and unchanged. Hypodensity left thalamus unchanged and consistent with chronic infarct. Vascular: Negative for hyperdense vessel Skull: No focal lesion. Sinuses/Orbits: Mucosal edema and bony thickening right maxillary sinus. Adjacent lucency in the right maxilla may be related to dental infection. Periapical lucency around left upper premolar with mild mucosal edema left maxillary sinus. Bilateral cataract extraction Other: None ASPECTS ([place] Stroke Program Early CT Score) - Ganglionic level infarction (caudate, lentiform nuclei, internal capsule, insula, M1-M3 cortex): 7 - Supraganglionic infarction (M4-M6 cortex): 3 Total  score (0-10 with 10 being normal): 10 IMPRESSION: 1. No acute intracranial abnormality. Atrophy and chronic microvascular ischemia. 2. Aspects is 10. 3. Code stroke imaging results were communicated on 10/26/2021 at 4:21 pm to provider Leonel Ramsay via secure text message Electronically Signed   By: Franchot Gallo M.D.   On: 10/26/2021 16:22    EKG: Independently reviewed.  NSR with rate 96 with PAC; nonspecific ST changes with no evidence of acute ischemia   Labs on Admission: I have personally reviewed the available labs and imaging studies at the time of the admission.  Pertinent labs:   hgb: 10.6,  potassium: 3.2,  covid positive  Assessment and Plan: Principal Problem:   Acute encephalopathy  Active Problems:   COVID-19 virus infection   Hypokalemia   PAF (paroxysmal atrial fibrillation) (HCC)   History of TIA (transient ischemic attack)   Hypertension   Crohn's disease ([place])   B12 deficiency   Glaucoma   Normocytic anemia    Assessment and Plan: * Acute encephalopathy 88 year old male presenting with acute confusion and weakness in setting covid-19 -obs to telemetry -likely secondary to infection from covid-19 +fever  -UA clear, CXR with no pneumonia  -head CT with no acute findings -initially code stroke was called due to some concern for aphasia on arrival; however this has resolved. With hx of TIA and in setting of aphasia neuro ordered brain MRI. If positive will pursue stroke work up, but low suspicion at this point -will check metabolic labs: TSH +P59 with hx of B12 deficiency  -covid-19 with no oxygen requirement, supportive therapy see below -anti-pyretics to control fever  -appears back to baseline. Alert and oriented and answered all questions appropriately  -received 1L IVF in ED, gentle time limited IVF x 6 hours with continued fever and dry on exam    COVID-19 virus infection Possible onset of symptoms Saturday with cough vs. Today with fever?  Overall  feels fine, but likely contributing to confusion and weakness  -covid labs -PPE/droplet and contact precautions -no oxygen requirement and CXR with no pneumonia -covid vaccines x 3 -supportive care with anti-tussive, vitamins, xoponex PRN and molnupiravir  -CXR with cardiomegaly and central pulmonary vascular congestion and mild interstitial edema. Hx of grade 1 DD. No s/s of volume overload or CHF. BNP pending consider echo in setting of covid 19  -repeat 2V CXR in AM  -PT for weakness   Hypokalemia/hypomagnesia  Check magnesium-low repleted  Replete and trend   PAF (paroxysmal atrial fibrillation) (HCC) NSR, rate controlled Continue eliquis and toprol-xl 80m daily   History of TIA (transient ischemic attack) MRI pending to r/o CVA Continue eliquis LDL has been to goal off statins (53 in 07/2021)  Hypertension Well controlled  Continue toprol-xl 279mdaily   Crohn's disease ([facility]. ArHavery Moroso evidence of flair  budesonide as needed  B12 deficiency b12 of 243 in July 2023  Repeat level pending with AMS   Glaucoma Continue drops   Normocytic anemia Continue iron   Advance Care Planning:   Code Status: Full Code    Consults: neurology, PT  DVT Prophylaxis: eliquis   Family Communication: updated daughter by phone: ShJudyann Munson Severity of Illness: The appropriate patient status for this patient is OBSERVATION. Observation status is judged to be reasonable and necessary in order to provide the required intensity of service to ensure the patient's safety. The patient's presenting symptoms, physical exam findings, and initial radiographic and laboratory data in the context of their medical condition is felt to place them at decreased risk for further clinical deterioration. Furthermore, it is anticipated that the patient will be medically stable for discharge from the hospital within 2 midnights of admission.   Author: AlOrma Flaming MD 10/26/2021 8:18 PM  For on call review [email or web address]

## 2021-10-26 NOTE — Assessment & Plan Note (Signed)
Continue drops

## 2021-10-26 NOTE — Assessment & Plan Note (Signed)
Well controlled  Continue toprol-xl 84m daily

## 2021-10-26 NOTE — Assessment & Plan Note (Addendum)
Check magnesium-low repleted  Replete and trend

## 2021-10-27 ENCOUNTER — Observation Stay (HOSPITAL_COMMUNITY): Payer: No Typology Code available for payment source

## 2021-10-27 DIAGNOSIS — U071 COVID-19: Secondary | ICD-10-CM | POA: Diagnosis not present

## 2021-10-27 DIAGNOSIS — E538 Deficiency of other specified B group vitamins: Secondary | ICD-10-CM | POA: Diagnosis not present

## 2021-10-27 DIAGNOSIS — E876 Hypokalemia: Secondary | ICD-10-CM | POA: Diagnosis not present

## 2021-10-27 DIAGNOSIS — Z7901 Long term (current) use of anticoagulants: Secondary | ICD-10-CM | POA: Diagnosis not present

## 2021-10-27 DIAGNOSIS — I480 Paroxysmal atrial fibrillation: Secondary | ICD-10-CM | POA: Diagnosis not present

## 2021-10-27 DIAGNOSIS — R059 Cough, unspecified: Secondary | ICD-10-CM | POA: Diagnosis not present

## 2021-10-27 DIAGNOSIS — I10 Essential (primary) hypertension: Secondary | ICD-10-CM | POA: Diagnosis not present

## 2021-10-27 DIAGNOSIS — Z79899 Other long term (current) drug therapy: Secondary | ICD-10-CM | POA: Diagnosis not present

## 2021-10-27 DIAGNOSIS — Z87891 Personal history of nicotine dependence: Secondary | ICD-10-CM | POA: Diagnosis not present

## 2021-10-27 DIAGNOSIS — Z8673 Personal history of transient ischemic attack (TIA), and cerebral infarction without residual deficits: Secondary | ICD-10-CM | POA: Diagnosis not present

## 2021-10-27 DIAGNOSIS — G9340 Encephalopathy, unspecified: Secondary | ICD-10-CM | POA: Diagnosis not present

## 2021-10-27 DIAGNOSIS — Z86718 Personal history of other venous thrombosis and embolism: Secondary | ICD-10-CM | POA: Diagnosis not present

## 2021-10-27 LAB — COMPREHENSIVE METABOLIC PANEL
ALT: 9 U/L (ref 0–44)
AST: 22 U/L (ref 15–41)
Albumin: 2.6 g/dL — ABNORMAL LOW (ref 3.5–5.0)
Alkaline Phosphatase: 48 U/L (ref 38–126)
Anion gap: 7 (ref 5–15)
BUN: 10 mg/dL (ref 8–23)
CO2: 26 mmol/L (ref 22–32)
Calcium: 8.4 mg/dL — ABNORMAL LOW (ref 8.9–10.3)
Chloride: 103 mmol/L (ref 98–111)
Creatinine, Ser: 0.82 mg/dL (ref 0.61–1.24)
GFR, Estimated: >60 mL/min (ref >60)
Glucose, Bld: 114 mg/dL — ABNORMAL HIGH (ref 70–99)
Potassium: 3.8 mmol/L (ref 3.5–5.1)
Sodium: 136 mmol/L (ref 135–145)
Total Bilirubin: 1.4 mg/dL — ABNORMAL HIGH (ref 0.3–1.2)
Total Protein: 5.3 g/dL — ABNORMAL LOW (ref 6.5–8.1)

## 2021-10-27 LAB — CBC WITH DIFFERENTIAL/PLATELET
Abs Immature Granulocytes: 0.06 10*3/uL (ref 0.00–0.07)
Basophils Absolute: 0 10*3/uL (ref 0.0–0.1)
Basophils Relative: 1 %
Eosinophils Absolute: 0 10*3/uL (ref 0.0–0.5)
Eosinophils Relative: 1 %
HCT: 29.8 % — ABNORMAL LOW (ref 39.0–52.0)
Hemoglobin: 10.1 g/dL — ABNORMAL LOW (ref 13.0–17.0)
Immature Granulocytes: 1 %
Lymphocytes Relative: 27 %
Lymphs Abs: 1.5 10*3/uL (ref 0.7–4.0)
MCH: 30.8 pg (ref 26.0–34.0)
MCHC: 33.9 g/dL (ref 30.0–36.0)
MCV: 90.9 fL (ref 80.0–100.0)
Monocytes Absolute: 1.1 10*3/uL — ABNORMAL HIGH (ref 0.1–1.0)
Monocytes Relative: 19 %
Neutro Abs: 3 10*3/uL (ref 1.7–7.7)
Neutrophils Relative %: 51 %
Platelets: 137 10*3/uL — ABNORMAL LOW (ref 150–400)
RBC: 3.28 MIL/uL — ABNORMAL LOW (ref 4.22–5.81)
RDW: 15.7 % — ABNORMAL HIGH (ref 11.5–15.5)
WBC: 5.7 10*3/uL (ref 4.0–10.5)
nRBC: 0.4 % — ABNORMAL HIGH (ref 0.0–0.2)

## 2021-10-27 LAB — CBG MONITORING, ED: Glucose-Capillary: 132 mg/dL — ABNORMAL HIGH (ref 70–99)

## 2021-10-27 LAB — VITAMIN B12: Vitamin B-12: 266 pg/mL (ref 180–914)

## 2021-10-27 LAB — BRAIN NATRIURETIC PEPTIDE: B Natriuretic Peptide: 113.5 pg/mL — ABNORMAL HIGH (ref 0.0–100.0)

## 2021-10-27 LAB — MAGNESIUM: Magnesium: 1.9 mg/dL (ref 1.7–2.4)

## 2021-10-27 LAB — C-REACTIVE PROTEIN: CRP: 8.8 mg/dL — ABNORMAL HIGH (ref <1.0)

## 2021-10-27 MED ORDER — METOPROLOL SUCCINATE ER 25 MG PO TB24
25.0000 mg | ORAL_TABLET | Freq: Every day | ORAL | Status: DC
  Administered 2021-10-27 – 2021-10-29 (×3): 25 mg via ORAL
  Filled 2021-10-27 (×3): qty 1

## 2021-10-27 NOTE — ED Notes (Signed)
Called and updated the daughter per daughter request.

## 2021-10-27 NOTE — ED Notes (Signed)
NT and myself changed depends and placed full linen change pt talking on the phone to family

## 2021-10-27 NOTE — Progress Notes (Signed)
Physical Therapy Evaluation Patient Details Name: Michael [name] MRN: [id number] DOB: May 06, 1932 Today's Date: 10/27/2021  History of Present Illness  88 yo male with onset of memory changes and AMS was brought to hosp 10/17 after finding pt could not talk, lift his arrms or perform simple motor tasks.  Pt resolved aphasia, cleared for stroke but has PAF with RVR, Covid infection.  PMHx:  TIA, B12 defic, and glaucoma.  Clinical Impression  Pt was seen for mobility after getting up to side of bed.  Has mult lines and IV's, unable to coordinate his movement without a lot of untangling and instructions to pt.  He is expected to go to rehab, but may have family support for home.  Pt is unclear about his history and will need to determine his actual information to get a picture of dc planning if it is changing.  Otherwise focus on safety with gait and will need to be assisted to move for the next while until safety with walker use is observed.  Reinforce set up to sit, standing use of walker and safety to make obstacle clearance and path change moves.       Recommendations for follow up therapy are one component of a multi-disciplinary discharge planning process, led by the attending physician.  Recommendations may be updated based on patient status, additional functional criteria and insurance authorization.  Follow Up Recommendations Skilled nursing-short term rehab (<3 hours/day) Can patient physically be transported by private vehicle: No    Assistance Recommended at Discharge Frequent or constant Supervision/Assistance  Patient can return home with the following  A little help with walking and/or transfers;A little help with bathing/dressing/bathroom;Assistance with cooking/housework;Direct supervision/assist for medications management;Direct supervision/assist for financial management;Assist for transportation;Help with stairs or ramp for entrance    Equipment Recommendations None recommended  by PT  Recommendations for Other Services       Functional Status Assessment Patient has had a recent decline in their functional status and demonstrates the ability to make significant improvements in function in a reasonable and predictable amount of time.     Precautions / Restrictions Precautions Precautions: Fall Precaution Comments: confused Restrictions Weight Bearing Restrictions: No      Mobility  Bed Mobility Overal bed mobility: Needs Assistance Bed Mobility: Supine to Sit, Sit to Supine     Supine to sit: Min assist Sit to supine: Min assist   General bed mobility comments: pt tries to move alone but needs some assistance to get his trunk off gurney    Transfers Overall transfer level: Needs assistance   Transfers: Sit to/from Stand Sit to Stand: Min assist           General transfer comment: min assist for lines and safety    Ambulation/Gait Ambulation/Gait assistance: Min guard Gait Distance (Feet): 22 Feet Assistive device: Rolling walker (2 wheels), 1 person hand held assist Gait Pattern/deviations: Step-through pattern, Decreased stride length, Narrow base of support, Trunk flexed Gait velocity: reduced Gait velocity interpretation: <1.31 ft/sec, indicative of household ambulator Pre-gait activities: standing balance ck and monitoring vitals General Gait Details: pt is hindered by the size of the room and covid precautions  Stairs            Wheelchair Mobility    Modified Rankin (Stroke Patients Only)       Balance Overall balance assessment: Needs assistance Sitting-balance support: Feet supported Sitting balance-Leahy Scale: Good     Standing balance support: Bilateral upper extremity supported, During functional activity Standing  balance-Leahy Scale: Fair Standing balance comment: less than fair dynamically                             Pertinent Vitals/Pain Pain Assessment Pain Assessment: No/denies pain     Home Living Family/patient expects to be discharged to:: Unsure                   Additional Comments: pt could not give a history to PT    Prior Function Prior Level of Function : Patient poor historian/Family not available             Mobility Comments: pt reports he walked alone       Hand Dominance   Dominant Hand: Right    Extremity/Trunk Assessment   Upper Extremity Assessment Upper Extremity Assessment: Overall WFL for tasks assessed    Lower Extremity Assessment Lower Extremity Assessment: Generalized weakness    Cervical / Trunk Assessment Cervical / Trunk Assessment: Kyphotic  Communication   Communication: No difficulties  Cognition Arousal/Alertness: Awake/alert Behavior During Therapy: Impulsive, Restless Overall Cognitive Status: No family/caregiver present to determine baseline cognitive functioning                                 General Comments: not clear how close to baseline pt is        General Comments General comments (skin integrity, edema, etc.): pt is able to support standing and walking, with O2 sats 95% or greater and no signs of excessive fatigue.  Distance limited by room size and his precautions    Exercises     Assessment/Plan    PT Assessment Patient needs continued PT services  PT Problem List Decreased activity tolerance;Decreased balance;Decreased mobility;Decreased coordination;Decreased safety awareness       PT Treatment Interventions DME instruction;Gait training;Functional mobility training;Therapeutic activities;Therapeutic exercise;Balance training;Neuromuscular re-education;Patient/family education    PT Goals (Current goals can be found in the Care Plan section)  Acute Rehab PT Goals Patient Stated Goal: to go home and to see where his family is PT Goal Formulation: Patient unable to participate in goal setting Time For Goal Achievement: 11/10/21 Potential to Achieve Goals: Good     Frequency Min 3X/week     Co-evaluation               AM-PAC PT "6 Clicks" Mobility  Outcome Measure Help needed turning from your back to your side while in a flat bed without using bedrails?: None Help needed moving from lying on your back to sitting on the side of a flat bed without using bedrails?: A Little Help needed moving to and from a bed to a chair (including a wheelchair)?: A Little Help needed standing up from a chair using your arms (e.g., wheelchair or bedside chair)?: A Little Help needed to walk in hospital room?: A Little Help needed climbing 3-5 steps with a railing? : A Lot 6 Click Score: 18    End of Session Equipment Utilized During Treatment: Gait belt Activity Tolerance: Patient tolerated treatment well Patient left: in bed;with call bell/phone within reach Nurse Communication: Mobility status PT Visit Diagnosis: Unsteadiness on feet (R26.81);Muscle weakness (generalized) (M62.81)    Time: 6720-9470 PT Time Calculation (min) (ACUTE ONLY): 32 min   Charges:   PT Evaluation $PT Eval Moderate Complexity: 1 Mod PT Treatments $Gait Training: 8-22 mins  Ramond Dial 10/27/2021, 4:57 PM  Mee Hives, PT PhD Acute Rehab Dept. Number: [place] and [place]

## 2021-10-27 NOTE — Progress Notes (Signed)
  X-cover Note: RN reports pt is back in NSR. Will obtain EKG. Stop cardizem infusion.   Kristopher Oppenheim, DO [facility]

## 2021-10-27 NOTE — Progress Notes (Addendum)
PROGRESS NOTE    Michael [name]  JKK:[id number] DOB: [birth year] DOA: 10/26/2021 PCP: Binnie Rail, MD   Michael [name] is a 88 y.o. male with medical history significant of atrial fibrillation on eliquis, HTN, Crohn's disease, memory impairment, TIA, hx of DVT in 2019 who presented to ED with AMS. He was fine all day according to his wife. He did his normal routine and went to the gym at 1:00pm. When he got home he couldn't walk from the door to the chair,  wasn't comprehending, couldn't feed himself crackers, he would just stare at them.   When EMS arrived he couldn't lift his arms. He wouldn't follow commands.ER Course:  vitals: temp: 102.3, Covid pos, CXR:  central pulmonary vascular congestion and mild interstitial edema CT head: no acute finding Overnight with Afib RVR  Subjective: Feels okay, some cough, denies any dyspnea, started on Cardizem drip earlier this morning for A-fib with RVR  Assessment and Plan:  Acute encephalopathy -presenting with weakness and confusion -likely secondary to fever, covid-19  -UA clear, CXR with no pneumonia, CT and MRI brain without acute findings -continue airborne precautions -supportive care with anti-tussive, vitamins, xoponex PRN and molnupiravir -Day 2/5 -PT/OT eval  Hypokalemia/hypomagnesia -repleted  PAF with RVR -overnight Afib w/ RVR-started on cardizem gtt -increase toprol, wean off cardizem gtt Continue eliquis   History of TIA (transient ischemic attack) -continue eliquis  Hypertension Continue toprol-xl 20m daily   [place]'s disease [place] Followed by Dr. AHavery Morosbudesonide as needed  B12 deficiency b12 of 243 in July 2023  Repeat level pending with AMS   Glaucoma Continue drops   Normocytic anemia Continue iron   Adrenal insufficiency (HCC)-resolved as of 10/26/2021 Secondary to steroid use for chron's disease    DVT prophylaxis:eliquis Code Status: Full Code Family Communication: No family at  bedside, called and updated daughter Disposition Plan: Home likely tomorrow  Consultants:    Procedures:   Antimicrobials:    Objective: Vitals:   10/27/21 0249 10/27/21 0330 10/27/21 0400 10/27/21 0430  BP:  (!) 111/57 (!) 106/59 (!) 112/53  Pulse:  78 80 78  Resp:  (!) 24 (!) 24 (!) 25  Temp: 99.1 F (37.3 C)     TempSrc:      SpO2:  98% 95% 96%  Weight:        Intake/Output Summary (Last 24 hours) at 10/27/2021 0616 Last data filed at 10/26/2021 1822 Gross per 24 hour  Intake 1250.23 ml  Output --  Net 1250.23 ml   Filed Weights   10/26/21 1800  Weight: 77.6 kg    Examination:  Gen: Awake, Alert, Oriented X 2, mild cognitive deficits HEENT: no JVD Lungs: Good air movement bilaterally, CTAB CVS: S1S2/RRR Abd: soft, Non tender, non distended, BS present Extremities: No edema Skin: no new rashes on exposed skin    Data Reviewed:   CBC: Recent Labs  Lab 10/26/21 1605 10/26/21 1610 10/27/21 0311  WBC 5.7  --  5.7  NEUTROABS 3.5  --  3.0  HGB 10.6* 11.2* 10.1*  HCT 32.3* 33.0* 29.8*  MCV 92.3  --  90.9  PLT 158  --  1716   Basic Metabolic Panel: Recent Labs  Lab 10/26/21 1605 10/26/21 1610 10/26/21 1852 10/27/21 0311  NA 135 136  --  136  K 3.2* 3.2*  --  3.8  CL 102 98  --  103  CO2 24  --   --  26  GLUCOSE 113* 108*  --  114*  BUN 11 12  --  10  CREATININE 1.04 1.00  --  0.82  CALCIUM 8.2*  --   --  8.4*  MG  --   --  1.2* 1.9   GFR: Estimated Creatinine Clearance: 67.6 mL/min (by C-G formula based on SCr of 0.82 mg/dL). Liver Function Tests: Recent Labs  Lab 10/26/21 1605 10/27/21 0311  AST 16 22  ALT 9 9  ALKPHOS 57 48  BILITOT 0.9 1.4*  PROT 6.2* 5.3*  ALBUMIN 3.2* 2.6*   No results for input(s): "LIPASE", "AMYLASE" in the last 168 hours. No results for input(s): "AMMONIA" in the last 168 hours. Coagulation Profile: Recent Labs  Lab 10/26/21 1605  INR 1.4*   Cardiac Enzymes: No results for input(s): "CKTOTAL",  "CKMB", "CKMBINDEX", "TROPONINI" in the last 168 hours. BNP (last 3 results) No results for input(s): "PROBNP" in the last 8760 hours. HbA1C: No results for input(s): "HGBA1C" in the last 72 hours. CBG: Recent Labs  Lab 10/26/21 1604  GLUCAP 96   Lipid Profile: No results for input(s): "CHOL", "HDL", "LDLCALC", "TRIG", "CHOLHDL", "LDLDIRECT" in the last 72 hours. Thyroid Function Tests: Recent Labs    10/26/21 2200  TSH 1.747   Anemia Panel: Recent Labs    10/26/21 1852  FERRITIN 107   Urine analysis:    Component Value Date/Time   COLORURINE YELLOW 10/26/2021 1559   APPEARANCEUR CLEAR 10/26/2021 1559   LABSPEC 1.014 10/26/2021 1559   PHURINE 5.0 10/26/2021 1559   GLUCOSEU NEGATIVE 10/26/2021 1559   GLUCOSEU NEGATIVE 05/08/2019 1623   HGBUR NEGATIVE 10/26/2021 1559   BILIRUBINUR NEGATIVE 10/26/2021 1559   KETONESUR NEGATIVE 10/26/2021 1559   PROTEINUR NEGATIVE 10/26/2021 1559   UROBILINOGEN 0.2 05/08/2019 1623   NITRITE NEGATIVE 10/26/2021 1559   LEUKOCYTESUR NEGATIVE 10/26/2021 1559   Sepsis Labs: @LABRCNTIP (procalcitonin:4,lacticidven:4)  ) Recent Results (from the past 240 hour(s))  Resp Panel by RT-PCR (Flu A&B, Covid) Anterior Nasal Swab     Status: Abnormal   Collection Time: 10/26/21  4:27 PM   Specimen: Anterior Nasal Swab  Result Value Ref Range Status   SARS Coronavirus 2 by RT PCR POSITIVE (A) NEGATIVE Final    Comment: (NOTE) SARS-CoV-2 target nucleic acids are DETECTED.  The SARS-CoV-2 RNA is generally detectable in upper respiratory specimens during the acute phase of infection. Positive results are indicative of the presence of the identified virus, but do not rule out bacterial infection or co-infection with other pathogens not detected by the test. Clinical correlation with patient history and other diagnostic information is necessary to determine patient infection status. The expected result is Negative.  Fact Sheet for  Patients: EntrepreneurPulse.com.au  Fact Sheet for Healthcare Providers: IncredibleEmployment.be  This test is not yet approved or cleared by the [place] FDA and  has been authorized for detection and/or diagnosis of SARS-CoV-2 by FDA under an Emergency Use Authorization (EUA).  This EUA will remain in effect (meaning this test can be used) for the duration of  the COVID-19 declaration under Section 564(b)(1) of the A ct, 21 U.S.C. section 360bbb-3(b)(1), unless the authorization is terminated or revoked sooner.     Influenza A by PCR NEGATIVE NEGATIVE Final   Influenza B by PCR NEGATIVE NEGATIVE Final    Comment: (NOTE) The Xpert Xpress SARS-CoV-2/FLU/RSV plus assay is intended as an aid in the diagnosis of influenza from Nasopharyngeal swab specimens and should not be used as a sole basis for treatment. Nasal washings and aspirates are unacceptable for  Xpert Xpress SARS-CoV-2/FLU/RSV testing.  Fact Sheet for Patients: EntrepreneurPulse.com.au  Fact Sheet for Healthcare Providers: IncredibleEmployment.be  This test is not yet approved or cleared by the [place] FDA and has been authorized for detection and/or diagnosis of SARS-CoV-2 by FDA under an Emergency Use Authorization (EUA). This EUA will remain in effect (meaning this test can be used) for the duration of the COVID-19 declaration under Section 564(b)(1) of the Act, 21 U.S.C. section 360bbb-3(b)(1), unless the authorization is terminated or revoked.  Performed at [facility], [address], [place], [place] [address]      Radiology Studies: MR BRAIN WO CONTRAST  Result Date: 10/26/2021 CLINICAL DATA:  Initial evaluation for neuro deficit, stroke suspected. EXAM: MRI HEAD WITHOUT CONTRAST TECHNIQUE: Multiplanar, multiecho pulse sequences of the brain and surrounding structures were obtained without intravenous contrast.  COMPARISON:  Prior CTs from earlier the same day. FINDINGS: Brain: Cerebral volume within normal limits. Patchy T2/FLAIR hyperintensity involving the periventricular and deep white matter both cerebral hemispheres, most consistent with chronic small vessel ischemic disease, mild for age. Few small remote lacunar infarcts present about the bilateral basal ganglia and left thalamus. No evidence for acute or subacute ischemia. Gray-white matter differentiation maintained. No areas of chronic cortical infarction. No acute or chronic intracranial blood products. No mass lesion, midline shift or mass effect. No hydrocephalus or extra-axial fluid collection. Pituitary gland and suprasellar region within normal limits. Vascular: Major intracranial vascular flow voids are maintained. Skull and upper cervical spine: Craniocervical junction normal. Bone marrow signal intensity within normal limits. No scalp soft tissue abnormality. Sinuses/Orbits: Prior bilateral ocular lens replacement. Scattered mucosal thickening present about the ethmoidal air cells and right greater than left maxillary sinuses. Trace right mastoid effusion noted, of doubtful significance. Other: None. IMPRESSION: 1. No acute intracranial abnormality. 2. Mild chronic microvascular ischemic disease for age with a few small remote lacunar infarcts about the bilateral basal ganglia and left thalamus. Electronically Signed   By: Jeannine Boga M.D.   On: 10/26/2021 22:27   DG Chest Port 1 View  Result Date: 10/26/2021 CLINICAL DATA:  Questionable sepsis EXAM: PORTABLE CHEST 1 VIEW COMPARISON:  Chest x-ray 11/15/2015 FINDINGS: Heart is mildly enlarged. There central pulmonary vascular congestion with central interstitial prominence diffusely. There is no lung consolidation, pleural effusion or pneumothorax. No acute fractures are seen. IMPRESSION: Cardiomegaly with central pulmonary vascular congestion and mild interstitial edema. Electronically  Signed   By: Ronney Asters M.D.   On: 10/26/2021 17:27   CT ANGIO HEAD NECK W WO CM (CODE STROKE)  Result Date: 10/26/2021 CLINICAL DATA:  Acute neuro deficit rule out stroke.  Aphasia. EXAM: CT ANGIOGRAPHY HEAD AND NECK TECHNIQUE: Multidetector CT imaging of the head and neck was performed using the standard protocol during bolus administration of intravenous contrast. Multiplanar CT image reconstructions and MIPs were obtained to evaluate the vascular anatomy. Carotid stenosis measurements (when applicable) are obtained utilizing NASCET criteria, using the distal internal carotid diameter as the denominator. RADIATION DOSE REDUCTION: This exam was performed according to the departmental dose-optimization program which includes automated exposure control, adjustment of the mA and/or kV according to patient size and/or use of iterative reconstruction technique. CONTRAST:  60 mL OMNIPAQUE IOHEXOL 350 MG/ML SOLN COMPARISON:  CT head 10/26/2021 FINDINGS: CTA NECK FINDINGS Aortic arch: Aortic arch incompletely evaluated. Proximal great vessels widely patent. Right carotid system: Mild atherosclerotic calcification right carotid bifurcation. Negative for stenosis Left carotid system: Mild atherosclerotic calcification left carotid stenosis. Negative  for stenosis Vertebral arteries: Both vertebral arteries patent to the basilar without stenosis Skeleton: Cervical spondylosis without acute skeletal abnormality. Other neck: Negative for mass or soft tissue swelling Upper chest: Apical emphysema and probable scarring. No acute abnormality Review of the MIP images confirms the above findings CTA HEAD FINDINGS Anterior circulation: Internal carotid artery widely patent through the skull base and cavernous segment bilaterally. Minimal atherosclerotic disease in the cavernous carotid bilaterally. Anterior and middle cerebral arteries normal without stenosis or aneurysm Posterior circulation: Both vertebral arteries patent  to the basilar. PICA patent bilaterally. PICA patent. AICA, superior cerebellar, posterior cerebral arteries patent without stenosis or aneurysm. Venous sinuses: Normal venous enhancement Anatomic variants: None Review of the MIP images confirms the above findings IMPRESSION: Negative for intracranial large vessel occlusion or significant stenosis. Mild atherosclerotic disease in the carotid bifurcation bilaterally. No significant carotid or vertebral artery stenosis in the neck. Electronically Signed   By: Franchot Gallo M.D.   On: 10/26/2021 16:33   CT HEAD CODE STROKE WO CONTRAST  Result Date: 10/26/2021 CLINICAL DATA:  Code stroke.  Acute neuro deficit.  Aphasia EXAM: CT HEAD WITHOUT CONTRAST TECHNIQUE: Contiguous axial images were obtained from the base of the skull through the vertex without intravenous contrast. RADIATION DOSE REDUCTION: This exam was performed according to the departmental dose-optimization program which includes automated exposure control, adjustment of the mA and/or kV according to patient size and/or use of iterative reconstruction technique. COMPARISON:  CT head 09/24/2020 FINDINGS: Brain: No evidence of acute infarction, hemorrhage, hydrocephalus, extra-axial collection or mass lesion/mass effect. Generalized atrophy. Patchy white matter hypodensity bilaterally is chronic and unchanged. Hypodensity left thalamus unchanged and consistent with chronic infarct. Vascular: Negative for hyperdense vessel Skull: No focal lesion. Sinuses/Orbits: Mucosal edema and bony thickening right maxillary sinus. Adjacent lucency in the right maxilla may be related to dental infection. Periapical lucency around left upper premolar with mild mucosal edema left maxillary sinus. Bilateral cataract extraction Other: None ASPECTS ([place] Stroke Program Early CT Score) - Ganglionic level infarction (caudate, lentiform nuclei, internal capsule, insula, M1-M3 cortex): 7 - Supraganglionic infarction (M4-M6  cortex): 3 Total score (0-10 with 10 being normal): 10 IMPRESSION: 1. No acute intracranial abnormality. Atrophy and chronic microvascular ischemia. 2. Aspects is 10. 3. Code stroke imaging results were communicated on 10/26/2021 at 4:21 pm to provider Leonel Ramsay via secure text message Electronically Signed   By: Franchot Gallo M.D.   On: 10/26/2021 16:22     Scheduled Meds:  apixaban  5 mg Oral BID   vitamin C  1,000 mg Oral Q breakfast   brimonidine  1 drop Left Eye BID   cholecalciferol  1,000 Units Oral Daily   dorzolamide-timolol  1 drop Left Eye BID   ferrous sulfate  325 mg Oral Once per day on Mon Wed Fri   latanoprost  1 drop Left Eye QHS   metoprolol succinate  12.5 mg Oral Daily   molnupiravir EUA  4 capsule Oral BID   Netarsudil Dimesylate  1 drop Left Eye QHS   pantoprazole  40 mg Oral Daily   sodium chloride flush  3 mL Intravenous Once   tamsulosin  0.4 mg Oral QHS   zinc sulfate  220 mg Oral Daily   Continuous Infusions:  diltiazem (CARDIZEM) infusion 5 mg/hr (10/26/21 2205)     LOS: 0 days    Time spent: 28mn    PDomenic Polite MD [facility]   10/27/2021, 6:16 AM

## 2021-10-28 ENCOUNTER — Observation Stay (HOSPITAL_COMMUNITY): Payer: No Typology Code available for payment source

## 2021-10-28 DIAGNOSIS — E876 Hypokalemia: Secondary | ICD-10-CM

## 2021-10-28 DIAGNOSIS — U071 COVID-19: Secondary | ICD-10-CM | POA: Diagnosis not present

## 2021-10-28 DIAGNOSIS — R4182 Altered mental status, unspecified: Secondary | ICD-10-CM | POA: Diagnosis not present

## 2021-10-28 DIAGNOSIS — I480 Paroxysmal atrial fibrillation: Secondary | ICD-10-CM

## 2021-10-28 DIAGNOSIS — G9340 Encephalopathy, unspecified: Secondary | ICD-10-CM | POA: Diagnosis not present

## 2021-10-28 LAB — CBC WITH DIFFERENTIAL/PLATELET
Abs Immature Granulocytes: 0.03 10*3/uL (ref 0.00–0.07)
Basophils Absolute: 0 10*3/uL (ref 0.0–0.1)
Basophils Relative: 1 %
Eosinophils Absolute: 0.2 10*3/uL (ref 0.0–0.5)
Eosinophils Relative: 4 %
HCT: 31.3 % — ABNORMAL LOW (ref 39.0–52.0)
Hemoglobin: 10.6 g/dL — ABNORMAL LOW (ref 13.0–17.0)
Immature Granulocytes: 1 %
Lymphocytes Relative: 30 %
Lymphs Abs: 1.3 10*3/uL (ref 0.7–4.0)
MCH: 30.2 pg (ref 26.0–34.0)
MCHC: 33.9 g/dL (ref 30.0–36.0)
MCV: 89.2 fL (ref 80.0–100.0)
Monocytes Absolute: 0.6 10*3/uL (ref 0.1–1.0)
Monocytes Relative: 14 %
Neutro Abs: 2.3 10*3/uL (ref 1.7–7.7)
Neutrophils Relative %: 50 %
Platelets: 141 10*3/uL — ABNORMAL LOW (ref 150–400)
RBC: 3.51 MIL/uL — ABNORMAL LOW (ref 4.22–5.81)
RDW: 15.7 % — ABNORMAL HIGH (ref 11.5–15.5)
WBC: 4.4 10*3/uL (ref 4.0–10.5)
nRBC: 0 % (ref 0.0–0.2)

## 2021-10-28 LAB — COMPREHENSIVE METABOLIC PANEL
ALT: 10 U/L (ref 0–44)
AST: 23 U/L (ref 15–41)
Albumin: 2.6 g/dL — ABNORMAL LOW (ref 3.5–5.0)
Alkaline Phosphatase: 47 U/L (ref 38–126)
Anion gap: 9 (ref 5–15)
BUN: 9 mg/dL (ref 8–23)
CO2: 24 mmol/L (ref 22–32)
Calcium: 8.1 mg/dL — ABNORMAL LOW (ref 8.9–10.3)
Chloride: 106 mmol/L (ref 98–111)
Creatinine, Ser: 0.97 mg/dL (ref 0.61–1.24)
GFR, Estimated: >60 mL/min (ref >60)
Glucose, Bld: 130 mg/dL — ABNORMAL HIGH (ref 70–99)
Potassium: 3.3 mmol/L — ABNORMAL LOW (ref 3.5–5.1)
Sodium: 139 mmol/L (ref 135–145)
Total Bilirubin: 0.8 mg/dL (ref 0.3–1.2)
Total Protein: 5.5 g/dL — ABNORMAL LOW (ref 6.5–8.1)

## 2021-10-28 LAB — URINE CULTURE: Culture: NO GROWTH

## 2021-10-28 LAB — MAGNESIUM: Magnesium: 1.8 mg/dL (ref 1.7–2.4)

## 2021-10-28 MED ORDER — VITAMIN B-12 1000 MCG PO TABS
1000.0000 ug | ORAL_TABLET | Freq: Every day | ORAL | Status: DC
  Administered 2021-10-28 – 2021-10-29 (×2): 1000 ug via ORAL
  Filled 2021-10-28 (×2): qty 1

## 2021-10-28 NOTE — Care Management Obs Status (Signed)
[facility] NOTIFICATION   Patient Details  Name: Michael [name] MRN: [id number] Date of Birth: [birth year]   Medicare Observation Status Notification Given:  Yes    Pollie Friar, RN 10/28/2021, 2:51 PM

## 2021-10-28 NOTE — Procedures (Signed)
Patient Name: Michael [name]  MRN: [id number]  Epilepsy Attending: Lora Havens  Referring Physician/Provider: Modena Jansky, MD  Date: 10/28/2021 Duration: 22.08 mins  Patient history: 88yo M with ams. EEG to evaluate for seizure  Level of alertness: Awake  AEDs during EEG study: None  Technical aspects: This EEG study was done with scalp electrodes positioned according to the 10-20 International system of electrode placement. Electrical activity was reviewed with band pass filter of 1-70Hz , sensitivity of 7 uV/mm, display speed of 2m/sec with a 60Hz  notched filter applied as appropriate. EEG data were recorded continuously and digitally stored.  Video monitoring was available and reviewed as appropriate.  Description: The posterior dominant rhythm consists of 8 Hz activity of moderate voltage (25-35 uV) seen predominantly in posterior head regions, symmetric and reactive to eye opening and eye closing. Hyperventilation and photic stimulation were not performed.     IMPRESSION: This study is within normal limits. No seizures or epileptiform discharges were seen throughout the recording.  A normal interictal EEG does not exclude the diagnosis of epilepsy.  [name] OBarbra Sarks

## 2021-10-28 NOTE — Progress Notes (Signed)
EEG complete - results pending 

## 2021-10-28 NOTE — Progress Notes (Signed)
Mobility Specialist: Progress Note   10/28/21 1653  Mobility  Activity Ambulated with assistance in hallway  Level of Assistance Standby assist, set-up cues, supervision of patient - no hands on  Assistive Device Four wheel walker  Distance Ambulated (ft) 940 ft  Activity Response Tolerated well  Mobility Referral Yes  $Mobility charge 1 Mobility   During Mobility: 102 HR, 94% SpO2  Pt received in the bed and agreeable to mobility. No c/o throughout. Pt to BR after session, BM successful, then back to bed. Pt has call bell and phone in reach.   [place] Terin Cragle Mobility Specialist Secure Chat Only

## 2021-10-28 NOTE — TOC Initial Note (Signed)
Transition of Care [facility]) - Initial/Assessment Note    Patient Details  Name: Michael [name] MRN: [id number] Date of Birth: 04/06/1932  Transition of Care [facility]) CM/SW Contact:    Pollie Friar, RN Phone Number: 10/28/2021, 3:54 PM  Clinical Narrative:                 Pt is from home alone. Currently his spouse is staying with their daughter due to health concerns.  Pt has been totally IADL. Current recommendations are for SNF rehab. Pt would need to be off covid isolation for SNF rehab which started on the 17th. Daughter is aware.  Daughter also asking about assistance at home once he returns home. Pt is active with the [facility] in [place]. His PcP: Dr Marjory Lies and SW: Santa Lighter [phone] ext [phone]. CM spoke to [place] and she is putting in for expedited caregivers. She says this still may take several weeks to start. Daughter aware.  Will follow tomorrow to see how pt is progressing with therapies.  Expected Discharge Plan: [place] Barriers to Discharge: Continued Medical Work up   Patient Goals and CMS Choice   CMS Medicare.gov Compare Post Acute Care list provided to:: Patient Choice offered to / list presented to : Patient, Adult Children  Expected Discharge Plan and Services Expected Discharge Plan: [place]   Discharge Planning Services: CM Consult Post Acute Care Choice: [place] arrangements for the past 2 months: Single Family Home                                      Prior Living Arrangements/Services Living arrangements for the past 2 months: Single Family Home Lives with:: Self Patient language and need for interpreter reviewed:: Yes Do you feel safe going back to the place where you live?: Yes      Need for Family Participation in Patient Care: Yes (Comment) Care giver support system in place?: No (comment)   Criminal Activity/Legal Involvement Pertinent to Current Situation/Hospitalization: No - Comment  as needed  Activities of Daily Living      Permission Sought/Granted                  Emotional Assessment Appearance:: Appears stated age Attitude/Demeanor/Rapport: Engaged Affect (typically observed): Accepting Orientation: : Oriented to Self, Oriented to Place, Oriented to  Time, Oriented to Situation   Psych Involvement: No (comment)  Admission diagnosis:  Aphasia [R47.01] Weakness [R53.1] Acute encephalopathy [G93.40] COVID-19 [U07.1] Patient Active Problem List   Diagnosis Date Noted   Hypokalemia/hypomagnesia 10/26/2021   COVID-19 virus infection 10/26/2021   Hypertension 10/26/2021   Acute encephalopathy 10/26/2021   Normocytic anemia 10/26/2021   Vitreous prolapse of right eye 10/05/2021   Chronic nasal congestion 09/27/2021   Intermediate stage nonexudative age-related macular degeneration of both eyes 07/01/2021   Vitreomacular adhesion of left eye 07/01/2021   Primary open angle glaucoma of left eye, severe stage 07/01/2021   Primary open angle glaucoma of right eye, severe stage 07/01/2021   Pseudophakia of both eyes 07/01/2021   Acute pain of left knee 01/14/2021   Poor balance 01/14/2021   Noninfected skin tear of left leg, initial encounter 01/14/2021   Skin tear of right upper extremity 01/14/2021   Acute left-sided low back pain without sciatica 11/17/2020   Vestibular disequilibrium    PAF (paroxysmal atrial fibrillation) ([place]) 09/23/2020  Genetic testing 04/27/2020   Aortic atherosclerosis ([place]) 12/24/2019   History of TIA (transient ischemic attack) 09/11/2019   Personal history of colonic polyps    Benign neoplasm of colon    Overactive bladder 05/06/2019   Polyp of large intestine    Asymptomatic varicose veins of left lower extremity 12/16/2017   Hyperglycemia 11/02/2017   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   Vitamin D deficiency 11/02/2017   History of DVT of lower extremity, left leg 07/2017 08/08/2017   SBO (small bowel obstruction) ([place])  07/05/2017   Bilateral hearing loss 05/31/2017   B12 deficiency 02/06/2017   Osteopenia 02/06/2017   Glaucoma 12/12/2015   Crohn's disease ([place]) 12/12/2015   Anemia 12/12/2015   Retinal ischemia 12/06/2011   PCP:  Binnie Rail, MD Pharmacy:   [facility] [address] [place] [address] Phone: [phone] Fax: [place], [place] - [address] [place] [address] Phone: [phone] Fax: [phone]     Social Determinants of Health (SDOH) Interventions    Readmission Risk Interventions     No data to display

## 2021-10-28 NOTE — Progress Notes (Signed)
PROGRESS NOTE   Michael [name]  CWC:[id number]    DOB: [birth year]    DOA: 10/26/2021  PCP: Binnie Rail, MD   I have briefly reviewed patients previous medical records in [facility].  Chief Complaint  Patient presents with   Code Stroke    Brief Narrative:  88 year old married male, physically quite active, PMH of atrial fibrillation on Eliquis, HTN, Crohn's disease, memory impairment, TIA, DVT in 2019 who presented to the ED with AMS.  As per his wife's report, he was fine all day, did his normal routine, went to the gym at 1 PM but when he got home he could not walk from the door to the chair, which was abnormal for him.  He was not comprehending, could not feed himself crackers and he would just stare at them.  When EMS arrived, he could not lift his arms.  He had been having mild cough since the Saturday prior.  In the ED febrile to 102.3 F, COVID-positive, chest x-ray suggested of pneumonia, CT head without acute findings.  Admitted for acute encephalopathy secondary to febrile illness from COVID-19 infection.  Overnight 10/17 went into A-fib with RVR, briefly on Cardizem drip, reverted to sinus rhythm.  Clinically improved.  Hopeful DC 10/20.   Assessment & Plan:  Principal Problem:   Acute encephalopathy Active Problems:   COVID-19 virus infection   Hypokalemia/hypomagnesia   PAF (paroxysmal atrial fibrillation) (HCC)   History of TIA (transient ischemic attack)   Hypertension   Crohn's disease ([place])   B12 deficiency   Glaucoma   Normocytic anemia   Acute encephalopathy -presenting with weakness and confusion -likely secondary to fever, covid-19  -UA clear, CXR with possible viral pneumonia, CT and MRI brain without acute findings.  EEG within normal limits. -continue airborne precautions -supportive care with anti-tussive, vitamins, xoponex PRN and molnupiravir -Day 3/5 -PT/OT evaluated 10/18 and recommended ST SNF.  However since then it appears that patient  has made significant improvement, ambulated 940 feet with mobility specialist, barely tachycardic and not hypoxic with activity.  PT to reevaluate in a.m. and hopefully can DC home. -Acute encephalopathy seems to have resolved.   Hypokalemia/hypomagnesia -Replace potassium and follow.  Magnesium 1.8.   PAF with RVR -overnight 10/17, Afib w/ RVR-started on cardizem gtt, reverted to normal sinus rhythm on 10/18 night and Cardizem drip was discontinued. -Continue increased dose of Toprol-XL. - Continue eliquis    History of TIA (transient ischemic attack) -continue eliquis   Hypertension Continue toprol-xl 64m daily.  Controlled.   Crohn's disease ([place] Followed by Dr. AHavery Morosbudesonide as needed   B12 deficiency b12 of 243 in July 2023  Repeat level 266.  Initiated oral B12 supplements, recommend repeating in in several weeks and if still not improving then can consider parenteral B12.   Glaucoma Continue drops    Normocytic anemia Continue iron.  Hemoglobin stable in the 10 g range.  Follow CBC.  Thrombocytopenia Possibly due to COVID-19.  Follow CBC in AM.  No bleeding.   Adrenal insufficiency (HCC)-resolved as of 10/26/2021 Secondary to steroid use for chron's disease   Body mass index is 23.86 kg/m.    DVT prophylaxis:   Patient on Eliquis anticoagulation   Code Status: Full Code:  Family Communication: Discussed with patient's daughter who is an RTherapist, sportsat the [employer] updated care and answered all question and potential discharge for tomorrow. Disposition:  Status is: Observation.  Although patient's care has crossed 2 midnights,  currently without medical intensity to recommend inpatient change.      Consultants:   None  Procedures:     Antimicrobials:   Molnupiravir   Subjective:  Mild intermittent dry cough.  Denies fever, dyspnea, chest pain.  Wants to get out of bed and move around.  States that he is usually active and goes walking about  4-5 times a day and goes to the gym.  Does not like hospital food much but has a good appetite.  Objective:   Vitals:   10/27/21 2319 10/28/21 0415 10/28/21 0423 10/28/21 0819  BP: (!) 108/56 120/67  136/67  Pulse: 61 (!) 108  75  Resp: 19 17  17   Temp: 98 F (36.7 C) 98.2 F (36.8 C)  99.8 F (37.7 C)  TempSrc: Oral Oral  Oral  SpO2: 94% (!) 88% 97% 97%  Weight:        General exam: Elderly male, moderately built and nourished sitting up comfortably in bed without distress.  Does not look septic or toxic. Respiratory system: Clear to auscultation. Respiratory effort normal. Cardiovascular system: S1 & S2 heard, RRR. No JVD, murmurs, rubs, gallops or clicks. No pedal edema.  Telemetry personally reviewed: Sinus rhythm. Gastrointestinal system: Abdomen is nondistended, soft and nontender. No organomegaly or masses felt. Normal bowel sounds heard. Central nervous system: Alert and oriented. No focal neurological deficits. Extremities: Symmetric 5 x 5 power. Skin: No rashes, lesions or ulcers Psychiatry: Judgement and insight appear normal. Mood & affect appropriate.     Data Reviewed:   I have personally reviewed following labs and imaging studies   CBC: Recent Labs  Lab 10/26/21 1605 10/26/21 1610 10/27/21 0311 10/28/21 1041  WBC 5.7  --  5.7 4.4  NEUTROABS 3.5  --  3.0 2.3  HGB 10.6* 11.2* 10.1* 10.6*  HCT 32.3* 33.0* 29.8* 31.3*  MCV 92.3  --  90.9 89.2  PLT 158  --  137* 141*    Basic Metabolic Panel: Recent Labs  Lab 10/26/21 1605 10/26/21 1610 10/26/21 1852 10/27/21 0311 10/28/21 1041  NA 135 136  --  136 139  K 3.2* 3.2*  --  3.8 3.3*  CL 102 98  --  103 106  CO2 24  --   --  26 24  GLUCOSE 113* 108*  --  114* 130*  BUN 11 12  --  10 9  CREATININE 1.04 1.00  --  0.82 0.97  CALCIUM 8.2*  --   --  8.4* 8.1*  MG  --   --  1.2* 1.9 1.8    Liver Function Tests: Recent Labs  Lab 10/26/21 1605 10/27/21 0311 10/28/21 1041  AST 16 22 23   ALT 9 9  10   ALKPHOS 57 48 47  BILITOT 0.9 1.4* 0.8  PROT 6.2* 5.3* 5.5*  ALBUMIN 3.2* 2.6* 2.6*    CBG: Recent Labs  Lab 10/26/21 1604 10/27/21 1645  GLUCAP 96 132*    Microbiology Studies:   Recent Results (from the past 240 hour(s))  Resp Panel by RT-PCR (Flu A&B, Covid) Anterior Nasal Swab     Status: Abnormal   Collection Time: 10/26/21  4:27 PM   Specimen: Anterior Nasal Swab  Result Value Ref Range Status   SARS Coronavirus 2 by RT PCR POSITIVE (A) NEGATIVE Final    Comment: (NOTE) SARS-CoV-2 target nucleic acids are DETECTED.  The SARS-CoV-2 RNA is generally detectable in upper respiratory specimens during the acute phase of infection. Positive results are indicative of  the presence of the identified virus, but do not rule out bacterial infection or co-infection with other pathogens not detected by the test. Clinical correlation with patient history and other diagnostic information is necessary to determine patient infection status. The expected result is Negative.  Fact Sheet for Patients: EntrepreneurPulse.com.au  Fact Sheet for Healthcare Providers: IncredibleEmployment.be  This test is not yet approved or cleared by the [place] FDA and  has been authorized for detection and/or diagnosis of SARS-CoV-2 by FDA under an Emergency Use Authorization (EUA).  This EUA will remain in effect (meaning this test can be used) for the duration of  the COVID-19 declaration under Section 564(b)(1) of the A ct, 21 U.S.C. section 360bbb-3(b)(1), unless the authorization is terminated or revoked sooner.     Influenza A by PCR NEGATIVE NEGATIVE Final   Influenza B by PCR NEGATIVE NEGATIVE Final    Comment: (NOTE) The Xpert Xpress SARS-CoV-2/FLU/RSV plus assay is intended as an aid in the diagnosis of influenza from Nasopharyngeal swab specimens and should not be used as a sole basis for treatment. Nasal washings and aspirates are  unacceptable for Xpert Xpress SARS-CoV-2/FLU/RSV testing.  Fact Sheet for Patients: EntrepreneurPulse.com.au  Fact Sheet for Healthcare Providers: IncredibleEmployment.be  This test is not yet approved or cleared by the [place] FDA and has been authorized for detection and/or diagnosis of SARS-CoV-2 by FDA under an Emergency Use Authorization (EUA). This EUA will remain in effect (meaning this test can be used) for the duration of the COVID-19 declaration under Section 564(b)(1) of the Act, 21 U.S.C. section 360bbb-3(b)(1), unless the authorization is terminated or revoked.  Performed at [facility], [place] [address], [place], [place] [address]   Blood Culture (routine x 2)     Status: None (Preliminary result)   Collection Time: 10/26/21  4:27 PM   Specimen: BLOOD  Result Value Ref Range Status   Specimen Description BLOOD BLOOD LEFT FOREARM  Final   Special Requests   Final    BOTTLES DRAWN AEROBIC AND ANAEROBIC Blood Culture adequate volume   Culture   Final    NO GROWTH 2 DAYS Performed at [facility], [place] [address] [place], [place] [address]    Report Status PENDING  Incomplete  Urine Culture     Status: None   Collection Time: 10/26/21  4:27 PM   Specimen: In/Out Cath Urine  Result Value Ref Range Status   Specimen Description IN/OUT CATH URINE  Final   Special Requests NONE  Final   Culture   Final    NO GROWTH Performed at [facility], [place] [address] [place], [place] [address]    Report Status 10/28/2021 FINAL  Final  Blood Culture (routine x 2)     Status: None (Preliminary result)   Collection Time: 10/26/21  4:43 PM   Specimen: BLOOD  Result Value Ref Range Status   Specimen Description BLOOD LEFT ANTECUBITAL  Final   Special Requests   Final    BOTTLES DRAWN AEROBIC AND ANAEROBIC Blood Culture adequate volume   Culture   Final    NO GROWTH 2 DAYS Performed at [facility], [place]  [address], [place], [place] [address]    Report Status PENDING  Incomplete    Radiology Studies:  EEG adult  Result Date: 10/28/2021 Lora Havens, MD     10/28/2021 10:51 AM Patient Name: NABOR THOMANN MRN: [id number] Epilepsy Attending: Lora Havens Referring Physician/Provider: Algis Liming  Lenis Dickinson, MD Date: 10/28/2021 Duration: 22.08 mins Patient history: 88yo M with ams. EEG to evaluate for seizure Level of alertness: Awake AEDs during EEG study: None Technical aspects: This EEG study was done with scalp electrodes positioned according to the 10-20 International system of electrode placement. Electrical activity was reviewed with band pass filter of 1-70Hz , sensitivity of 7 uV/mm, display speed of 57m/sec with a 60Hz  notched filter applied as appropriate. EEG data were recorded continuously and digitally stored.  Video monitoring was available and reviewed as appropriate. Description: The posterior dominant rhythm consists of 8 Hz activity of moderate voltage (25-35 uV) seen predominantly in posterior head regions, symmetric and reactive to eye opening and eye closing. Hyperventilation and photic stimulation were not performed.   IMPRESSION: This study is within normal limits. No seizures or epileptiform discharges were seen throughout the recording. A normal interictal EEG does not exclude the diagnosis of epilepsy. PLora Havens  DG Chest Port 1 View  Result Date: 10/27/2021 CLINICAL DATA:  Productive cough EXAM: PORTABLE CHEST 1 VIEW COMPARISON:  Chest x-ray dated October 26, 2021 FINDINGS: Cardiac and mediastinal contours are unchanged. Mild left-greater-than-right basilar opacities. No large pleural effusion or evidence of pneumothorax. IMPRESSION: Mild left-greater-than-right basilar opacities, likely due to atelectasis, although infection or aspiration could appear similar. Electronically Signed   By: LYetta GlassmanM.D.   On: 10/27/2021 08:31   MR BRAIN WO CONTRAST  Result Date:  10/26/2021 CLINICAL DATA:  Initial evaluation for neuro deficit, stroke suspected. EXAM: MRI HEAD WITHOUT CONTRAST TECHNIQUE: Multiplanar, multiecho pulse sequences of the brain and surrounding structures were obtained without intravenous contrast. COMPARISON:  Prior CTs from earlier the same day. FINDINGS: Brain: Cerebral volume within normal limits. Patchy T2/FLAIR hyperintensity involving the periventricular and deep white matter both cerebral hemispheres, most consistent with chronic small vessel ischemic disease, mild for age. Few small remote lacunar infarcts present about the bilateral basal ganglia and left thalamus. No evidence for acute or subacute ischemia. Gray-white matter differentiation maintained. No areas of chronic cortical infarction. No acute or chronic intracranial blood products. No mass lesion, midline shift or mass effect. No hydrocephalus or extra-axial fluid collection. Pituitary gland and suprasellar region within normal limits. Vascular: Major intracranial vascular flow voids are maintained. Skull and upper cervical spine: Craniocervical junction normal. Bone marrow signal intensity within normal limits. No scalp soft tissue abnormality. Sinuses/Orbits: Prior bilateral ocular lens replacement. Scattered mucosal thickening present about the ethmoidal air cells and right greater than left maxillary sinuses. Trace right mastoid effusion noted, of doubtful significance. Other: None. IMPRESSION: 1. No acute intracranial abnormality. 2. Mild chronic microvascular ischemic disease for age with a few small remote lacunar infarcts about the bilateral basal ganglia and left thalamus. Electronically Signed   By: BJeannine BogaM.D.   On: 10/26/2021 22:27    Scheduled Meds:    apixaban  5 mg Oral BID   vitamin C  1,000 mg Oral Q breakfast   brimonidine  1 drop Left Eye BID   cholecalciferol  1,000 Units Oral Daily   dorzolamide-timolol  1 drop Left Eye BID   ferrous sulfate  325 mg  Oral Once per day on Mon Wed Fri   latanoprost  1 drop Left Eye QHS   metoprolol succinate  25 mg Oral Daily   molnupiravir EUA  4 capsule Oral BID   Netarsudil Dimesylate  1 drop Left Eye QHS   pantoprazole  40 mg Oral Daily   sodium chloride flush  3 mL Intravenous Once   tamsulosin  0.4 mg Oral QHS   zinc sulfate  220 mg Oral Daily    Continuous Infusions:     LOS: 0 days     Vernell Leep, MD,  FACP, [facility], [place], [facility], [facility]   [facility] [place]     To contact the attending provider between 7A-7P or the covering provider during after hours 7P-7A, please log into the web site [email or web address] and access using universal [place] password for that web site. If you do not have the password, please call the hospital operator.  10/28/2021, 5:16 PM

## 2021-10-29 DIAGNOSIS — I480 Paroxysmal atrial fibrillation: Secondary | ICD-10-CM | POA: Diagnosis not present

## 2021-10-29 DIAGNOSIS — U071 COVID-19: Secondary | ICD-10-CM | POA: Diagnosis not present

## 2021-10-29 DIAGNOSIS — G9340 Encephalopathy, unspecified: Secondary | ICD-10-CM | POA: Diagnosis not present

## 2021-10-29 DIAGNOSIS — E876 Hypokalemia: Secondary | ICD-10-CM | POA: Diagnosis not present

## 2021-10-29 LAB — CBC WITH DIFFERENTIAL/PLATELET
Abs Immature Granulocytes: 0.04 10*3/uL (ref 0.00–0.07)
Basophils Absolute: 0 10*3/uL (ref 0.0–0.1)
Basophils Relative: 1 %
Eosinophils Absolute: 0.2 10*3/uL (ref 0.0–0.5)
Eosinophils Relative: 5 %
HCT: 30.9 % — ABNORMAL LOW (ref 39.0–52.0)
Hemoglobin: 10.7 g/dL — ABNORMAL LOW (ref 13.0–17.0)
Immature Granulocytes: 1 %
Lymphocytes Relative: 39 %
Lymphs Abs: 2 10*3/uL (ref 0.7–4.0)
MCH: 30.6 pg (ref 26.0–34.0)
MCHC: 34.6 g/dL (ref 30.0–36.0)
MCV: 88.3 fL (ref 80.0–100.0)
Monocytes Absolute: 0.4 10*3/uL (ref 0.1–1.0)
Monocytes Relative: 9 %
Neutro Abs: 2.3 10*3/uL (ref 1.7–7.7)
Neutrophils Relative %: 45 %
Platelets: 152 10*3/uL (ref 150–400)
RBC: 3.5 MIL/uL — ABNORMAL LOW (ref 4.22–5.81)
RDW: 15.7 % — ABNORMAL HIGH (ref 11.5–15.5)
WBC: 5 10*3/uL (ref 4.0–10.5)
nRBC: 0 % (ref 0.0–0.2)

## 2021-10-29 LAB — BASIC METABOLIC PANEL
Anion gap: 9 (ref 5–15)
BUN: 11 mg/dL (ref 8–23)
CO2: 23 mmol/L (ref 22–32)
Calcium: 8.2 mg/dL — ABNORMAL LOW (ref 8.9–10.3)
Chloride: 104 mmol/L (ref 98–111)
Creatinine, Ser: 0.97 mg/dL (ref 0.61–1.24)
GFR, Estimated: >60 mL/min (ref >60)
Glucose, Bld: 102 mg/dL — ABNORMAL HIGH (ref 70–99)
Potassium: 3.7 mmol/L (ref 3.5–5.1)
Sodium: 136 mmol/L (ref 135–145)

## 2021-10-29 MED ORDER — METOPROLOL SUCCINATE ER 25 MG PO TB24: 25.0000 mg | ORAL_TABLET | Freq: Every day | ORAL | 1 refills | Status: DC

## 2021-10-29 MED ORDER — MOLNUPIRAVIR EUA 200MG CAPSULE: 4.0000 | ORAL_CAPSULE | Freq: Two times a day (BID) | ORAL | 0 refills | Status: AC

## 2021-10-29 NOTE — Evaluation (Signed)
Occupational Therapy Evaluation Patient Details Name: Michael [name] MRN: [id number] DOB: 01-07-1933 Today's Date: 10/29/2021   History of Present Illness 88 yo male with onset of memory changes and AMS was brought to hosp 10/17 after finding pt could not talk, lift his arrms or perform simple motor tasks.  Pt resolved aphasia, cleared for stroke but has PAF with RVR, Covid infection.  PMHx:  TIA, B12 defic, and glaucoma.   Clinical Impression   PTA, per pt report, he lived with his wife and was independent in ADL and driving. Upon eval, pt performing LB ADL with supervision for safety and UB ADL with set-up. Pt scoring a 0 on the short blessed test and observed to require min extra time for problem solving during tasks. Performing simple money management task with min increased time as well. Pt was observed with moderate internal and external distractibility during session. During 4 step scavenger hunt in hall, required mod cues for attention to task and sequencing and upon return to room was only able to name 4 words beginning with the letter F, but was observed to maintain eye contact on TV in room. Of note, television was on during short blessed test, and pt not distracted. Due to demonstration of varying attention levels and min memory difficulty, recommending OP OT for safe return to IADL.      Recommendations for follow up therapy are one component of a multi-disciplinary discharge planning process, led by the attending physician.  Recommendations may be updated based on patient status, additional functional criteria and insurance authorization.   Follow Up Recommendations  Outpatient OT    Assistance Recommended at Discharge Intermittent Supervision/Assistance  Patient can return home with the following A little help with walking and/or transfers;A little help with bathing/dressing/bathroom;Assistance with cooking/housework;Direct supervision/assist for medications management;Direct  supervision/assist for financial management;Assist for transportation    Functional Status Assessment  Patient has had a recent decline in their functional status and demonstrates the ability to make significant improvements in function in a reasonable and predictable amount of time.  Equipment Recommendations  None recommended by OT (Pt reporting he has recommended equipment)    Recommendations for Other Services       Precautions / Restrictions Precautions Precautions: Fall Restrictions Weight Bearing Restrictions: No      Mobility Bed Mobility Overal bed mobility: Modified Independent                  Transfers Overall transfer level: Needs assistance Equipment used: Rolling walker (2 wheels) Transfers: Sit to/from Stand Sit to Stand: Supervision           General transfer comment: supervision for safety, moves quickly      Balance Overall balance assessment: Mild deficits observed, not formally tested                                         ADL either performed or assessed with clinical judgement   ADL Overall ADL's : Needs assistance/impaired Eating/Feeding: Modified independent   Grooming: Oral care;Supervision/safety;Standing   Upper Body Bathing: Set up;Sitting   Lower Body Bathing: Supervison/ safety;Sit to/from stand   Upper Body Dressing : Set up;Sitting   Lower Body Dressing: Sit to/from stand;Supervision/safety Lower Body Dressing Details (indicate cue type and reason): mod increased time due to internally distracted and max attempts to conversate rather than complete task. Toilet Transfer: Supervision/safety;Ambulation Toilet Transfer Details (  indicate cue type and reason): walking to sink twice without RW     Tub/ Shower Transfer: Supervision/safety;Ambulation;Shower seat;Walk-in shower   Functional mobility during ADLs: Supervision/safety;Rolling walker (2 wheels) General ADL Comments: Supervision with RW for  longer distances. Pt retrieving paper towel from floor with close supervision as well     Vision Baseline Vision/History: 1 Wears glasses Ability to See in Adequate Light: 0 Adequate Patient Visual Report: No change from baseline Vision Assessment?: No apparent visual deficits Additional Comments: WFL for tasks assessed.     Perception     Praxis      Pertinent Vitals/Pain Pain Assessment Pain Assessment: No/denies pain     Hand Dominance Right   Extremity/Trunk Assessment Upper Extremity Assessment Upper Extremity Assessment: Overall WFL for tasks assessed   Lower Extremity Assessment Lower Extremity Assessment: Generalized weakness   Cervical / Trunk Assessment Cervical / Trunk Assessment: Kyphotic   Communication Communication Communication: No difficulties   Cognition   Behavior During Therapy: Impulsive, Restless Overall Cognitive Status: Impaired/Different from baseline Area of Impairment: Attention, Safety/judgement, Awareness                   Current Attention Level: Sustained, Selective (Initially with selective attention, but in hallway and upon return to room, max externally distracted)     Safety/Judgement: Decreased awareness of deficits Awareness: Emergent (Only aware of any deficits when it is brought to his attention)   General Comments: Pt scoring a 2 on the short blessed test indicating normal cognition for basic daily tasks. Pt able to perform simple money management task with min incresed time (10.00-7.18). Pt perfroming 4 step scavenger hunt in hallway and was observed to be moderately externally distracting requiring up to mod cues for sequecning and attention to task. Additionally with continued difficulty with attention in room as television on and difficulty attending to task of naming as many words as pt could that start with letter F. Pt naming 3 words total in one minute, but observed to maintain eye contact on TV for duration as well  despite cues. Of note, television was also on during duration of short blessed test and pt with no difficulty attending.     General Comments  HR up to 110 max    Exercises     Shoulder Instructions      Home Living Family/patient expects to be discharged to:: Private residence Living Arrangements: Spouse/significant other Available Help at Discharge: Family;Available PRN/intermittently Type of Home: House Home Access: Level entry     Home Layout: One level     Bathroom Shower/Tub: Occupational psychologist: Handicapped height     Home Equipment: Cane - single point;Shower seat   Additional Comments: Pt providing history      Prior Functioning/Environment Prior Level of Function : Independent/Modified Independent;Driving             Mobility Comments: Reporting no AD ADLs Comments: Reports independence in ADL and driving. Pt reporting wife has previously handled finances        OT Problem List: Decreased strength;Impaired balance (sitting and/or standing);Decreased activity tolerance;Decreased cognition      OT Treatment/Interventions: Self-care/ADL training;Therapeutic exercise;Therapeutic activities;Cognitive remediation/compensation;DME and/or AE instruction;Patient/family education;Balance training    OT Goals(Current goals can be found in the care plan section) Acute Rehab OT Goals Patient Stated Goal: go home OT Goal Formulation: With patient Time For Goal Achievement: 11/12/21 Potential to Achieve Goals: Good  OT Frequency: Min 2X/week    Co-evaluation  AM-PAC OT "6 Clicks" Daily Activity     Outcome Measure Help from another person eating meals?: None Help from another person taking care of personal grooming?: A Little Help from another person toileting, which includes using toliet, bedpan, or urinal?: A Little Help from another person bathing (including washing, rinsing, drying)?: A Little Help from another person to  put on and taking off regular upper body clothing?: A Little Help from another person to put on and taking off regular lower body clothing?: A Little 6 Click Score: 19   End of Session Equipment Utilized During Treatment: Rolling walker (2 wheels);Gait belt Nurse Communication: Mobility status  Activity Tolerance: Patient tolerated treatment well Patient left: in bed;with call bell/phone within reach  OT Visit Diagnosis: Unsteadiness on feet (R26.81);Muscle weakness (generalized) (M62.81);Other abnormalities of gait and mobility (R26.89);Other symptoms and signs involving cognitive function                Time: 1040-1120 OT Time Calculation (min): 40 min Charges:  OT General Charges $OT Visit: 1 Visit OT Evaluation $OT Eval Moderate Complexity: 1 Mod OT Treatments $Self Care/Home Management : 23-37 mins  Shanda Howells, OTR/L [facility] Acute Rehabilitation Office: [phone]   Lula Olszewski 10/29/2021, 12:00 PM

## 2021-10-29 NOTE — Discharge Summary (Signed)
Physician Discharge Summary  YEIDEN FRENKEL DUK:[id number] DOB: [birth year]  PCP: Binnie Rail, MD  Admitted from: Home Discharged to: Home  Admit date: 10/26/2021 Discharge date: 10/29/2021  Recommendations for Outpatient Follow-up:    Follow-up Information     Binnie Rail, MD. Schedule an appointment as soon as possible for a visit in 1 week(s).   Specialty: Internal Medicine Why: To be seen with repeat labs (CBC & CMP). Contact information: [place] [address] [phone]                  Home Health: None    Equipment/Devices: None    Discharge Condition: Improved and stable.   Code Status: Full Code Diet recommendation:  Discharge Diet Orders (From admission, onward)     Start     Ordered   10/29/21 0000  Diet - low sodium heart healthy        10/29/21 1056             Discharge Diagnoses:  Principal Problem:   Acute encephalopathy Active Problems:   COVID-19 virus infection   Hypokalemia/hypomagnesia   PAF (paroxysmal atrial fibrillation) (HCC)   History of TIA (transient ischemic attack)   Hypertension   Crohn's disease ([place])   B12 deficiency   Glaucoma   Normocytic anemia   Brief Summary: 88 year old married male, physically quite active, PMH of atrial fibrillation on Eliquis, HTN, Crohn's disease, memory impairment, TIA, DVT in 2019 who presented to the ED with AMS.  As per his wife's report, he was fine all day, did his normal routine, went to the gym at 1 PM but when he got home he could not walk from the door to the chair, which was abnormal for him.  He was not comprehending, could not feed himself crackers and he would just stare at them.  When EMS arrived, he could not lift his arms.  He had been having mild cough since the Saturday prior.  In the ED febrile to 102.3 F, COVID-positive, chest x-ray suggested of pneumonia, CT head without acute findings.  Admitted for acute encephalopathy secondary to febrile  illness from COVID-19 infection.  Overnight 10/17 went into A-fib with RVR, briefly on Cardizem drip, reverted to sinus rhythm.  Clinically improved.      Assessment & Plan:    Acute encephalopathy -presenting with weakness and confusion -likely secondary to fever, covid-19  -UA clear, CXR with possible viral pneumonia, CT and MRI brain without acute findings.  EEG within normal limits.  BAL <10. -continue airborne precautions while hospitalized and continue home isolation/quarantine through 10/26. -supportive care with anti-tussive, vitamins, xoponex PRN and molnupiravir -Day 4/5 -PT/OT evaluated 10/18 and recommended ST SNF.  However since then it appears that patient has made significant improvement, ambulated 940 feet with mobility specialist on 10/19, barely tachycardic and not hypoxic with activity.  PT reevaluated today and recommended no PT follow-up. -Acute encephalopathy seems to have resolved.  COVID-19 infection: Had symptoms since Saturday of last week.  Tested positive on 10/26/2021.  Started molnupiravir on 10/17 and has completed 6 doses thus far and is to complete 4 doses at home.  Fibrinogen 429, D-dimer 0.71, ferritin 107, procalcitonin <0.1, CRP 8.8.  Clinically improved, no further cardiorespiratory symptoms, defervesced for a couple days, not hypoxic even with activity.   Hypokalemia/hypomagnesia -Replaced   PAF with RVR -overnight 10/17, Afib w/ RVR-started on cardizem gtt, reverted to normal sinus rhythm on 10/18 night and Cardizem drip  was discontinued. -Toprol all XL was increased from 12.5 Mg daily to 25 Mg daily. - Continue eliquis  -Minimal elevated troponin of 24 likely related to demand ischemia and febrile illness.  BNP 113.5.  Clinically euvolemic.   History of TIA (transient ischemic attack) -continue eliquis   Hypertension Continue toprol-xl 22m daily.  Controlled.   Crohn's disease ([place] Followed by Dr. AHavery Morosbudesonide as needed-however do  not see on his home med list.   B12 deficiency b12 of 243 in July 2023  Repeat level 266.  Patient appears to be on monthly B12 shots but may need to increase dose/frequency, defer to PCP during follow-up.   Glaucoma Continue drops.  Patient on multiple eyedrops at home.  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   Normocytic anemia Continue iron.  Hemoglobin stable in the 10 g range.     Thrombocytopenia Possibly due to COVID-19.  Resolved today.   Adrenal insufficiency (HCC)-resolved as of 10/26/2021 Secondary to steroid use for chron's disease    Body mass index is 23.86 kg/m.         Consultants:   None   Procedures:        Discharge Instructions  Discharge Instructions     Call MD for:   Complete by: As directed    Recurrent confusion or altered mental status.   Call MD for:  difficulty breathing, headache or visual disturbances   Complete by: As directed    Call MD for:  extreme fatigue   Complete by: As directed    Call MD for:  persistant dizziness or light-headedness   Complete by: As directed    Call MD for:  persistant nausea and vomiting   Complete by: As directed    Call MD for:  severe uncontrolled pain   Complete by: As directed    Call MD for:  temperature >100.4   Complete by: As directed    Diet - low sodium heart healthy   Complete by: As directed    Discharge instructions   Complete by: As directed    Continue COVID-related isolation/quarantine for 10 days from when you tested positive (tested positive on 10/26/2021).  Thereby, can come off of isolation on 11/05/2021.   Increase activity slowly   Complete by: As directed         Medication List     TAKE these medications    acetaminophen 500 MG tablet Commonly known as: TYLENOL Take 500-1,000 mg by mouth every 8 (eight) hours as needed for mild pain or headache.   apixaban 5 MG Tabs tablet Commonly known as: Eliquis Take 1 tablet (5 mg total) by mouth 2 (two) times daily.    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   brimonidine 0.2 % ophthalmic solution Commonly known as: ALPHAGAN Place 1 drop into the left eye in the morning and at bedtime. Instill 1 drop into the left eye twice a day- 6:45 AM and 5:50 PM   calcium carbonate 600 MG Tabs tablet Commonly known as: Calcium 600 Take 1 tablet (600 mg total) by mouth daily.   cyanocobalamin 1000 MCG/ML injection Commonly known as: VITAMIN B12 INJECT 1ML INTO THE MUSCLE ONCE EVERY THIRTY DAYS What changed:  how much to take how to take this when to take this additional instructions   dorzolamide-timolol 2-0.5 % ophthalmic solution Commonly known as: COSOPT Place 1 drop into the left eye 2 (  two) times daily.   ferrous sulfate 325 (65 FE) MG tablet Take 325 mg by mouth 3 (three) times a week. In the morning   latanoprost 0.005 % ophthalmic solution Commonly known as: XALATAN Place 1 drop into the left eye at bedtime.   loperamide 2 MG tablet Commonly known as: IMODIUM A-D Take 2 mg by mouth daily as needed for diarrhea or loose stools.   metoprolol succinate 25 MG 24 hr tablet Commonly known as: TOPROL-XL Take 1 tablet (25 mg total) by mouth daily. What changed:  how much to take when to take this   molnupiravir EUA 200 mg Caps capsule Commonly known as: LAGEVRIO Take 4 capsules (800 mg total) by mouth 2 (two) times daily for 2 days.   Netarsudil Dimesylate 0.02 % Soln Place 1 drop into the left eye at bedtime.   omeprazole 20 MG tablet Commonly known as: PRILOSEC OTC Take 20 mg by mouth daily before breakfast.   SYRINGE 3CC/25GX1" 25G X 1" 3 ML Misc Use for monthly B12 injections   tamsulosin 0.4 MG Caps capsule Commonly known as: FLOMAX Take 0.4 mg by mouth at bedtime.   vitamin C 1000 MG tablet Take 1,000 mg by mouth daily with breakfast.   Vitamin D3 50  MCG (2000 UT) Tabs Take 2,000 Units by mouth daily with breakfast.       No Known Allergies    Procedures/Studies: EEG adult  Result Date: November 18, 2021 Lora Havens, MD     Nov 18, 2021 10:51 AM Patient Name: GOPAL MALTER MRN: [id number] Epilepsy Attending: Lora Havens Referring Physician/Provider: Modena Jansky, MD Date: November 18, 2021 Duration: 22.08 mins Patient history: 88yo M with ams. EEG to evaluate for seizure Level of alertness: Awake AEDs during EEG study: None Technical aspects: This EEG study was done with scalp electrodes positioned according to the 10-20 International system of electrode placement. Electrical activity was reviewed with band pass filter of 1-70Hz , sensitivity of 7 uV/mm, display speed of 16m/sec with a 60Hz  notched filter applied as appropriate. EEG data were recorded continuously and digitally stored.  Video monitoring was available and reviewed as appropriate. Description: The posterior dominant rhythm consists of 8 Hz activity of moderate voltage (25-35 uV) seen predominantly in posterior head regions, symmetric and reactive to eye opening and eye closing. Hyperventilation and photic stimulation were not performed.   IMPRESSION: This study is within normal limits. No seizures or epileptiform discharges were seen throughout the recording. A normal interictal EEG does not exclude the diagnosis of epilepsy. PLora Havens  DG Chest Port 1 View  Result Date: 10/27/2021 CLINICAL DATA:  Productive cough EXAM: PORTABLE CHEST 1 VIEW COMPARISON:  Chest x-ray dated October 26, 2021 FINDINGS: Cardiac and mediastinal contours are unchanged. Mild left-greater-than-right basilar opacities. No large pleural effusion or evidence of pneumothorax. IMPRESSION: Mild left-greater-than-right basilar opacities, likely due to atelectasis, although infection or aspiration could appear similar. Electronically Signed   By: LYetta GlassmanM.D.   On: 10/27/2021 08:31   MR  BRAIN WO CONTRAST  Result Date: 10/26/2021 CLINICAL DATA:  Initial evaluation for neuro deficit, stroke suspected. EXAM: MRI HEAD WITHOUT CONTRAST TECHNIQUE: Multiplanar, multiecho pulse sequences of the brain and surrounding structures were obtained without intravenous contrast. COMPARISON:  Prior CTs from earlier the same day. FINDINGS: Brain: Cerebral volume within normal limits. Patchy T2/FLAIR hyperintensity involving the periventricular and deep white matter both cerebral hemispheres, most consistent with chronic small vessel ischemic disease, mild for age. Few small remote lacunar  infarcts present about the bilateral basal ganglia and left thalamus. No evidence for acute or subacute ischemia. Gray-white matter differentiation maintained. No areas of chronic cortical infarction. No acute or chronic intracranial blood products. No mass lesion, midline shift or mass effect. No hydrocephalus or extra-axial fluid collection. Pituitary gland and suprasellar region within normal limits. Vascular: Major intracranial vascular flow voids are maintained. Skull and upper cervical spine: Craniocervical junction normal. Bone marrow signal intensity within normal limits. No scalp soft tissue abnormality. Sinuses/Orbits: Prior bilateral ocular lens replacement. Scattered mucosal thickening present about the ethmoidal air cells and right greater than left maxillary sinuses. Trace right mastoid effusion noted, of doubtful significance. Other: None. IMPRESSION: 1. No acute intracranial abnormality. 2. Mild chronic microvascular ischemic disease for age with a few small remote lacunar infarcts about the bilateral basal ganglia and left thalamus. Electronically Signed   By: Jeannine Boga M.D.   On: 10/26/2021 22:27   DG Chest Port 1 View  Result Date: 10/26/2021 CLINICAL DATA:  Questionable sepsis EXAM: PORTABLE CHEST 1 VIEW COMPARISON:  Chest x-ray 11/15/2015 FINDINGS: Heart is mildly enlarged. There central  pulmonary vascular congestion with central interstitial prominence diffusely. There is no lung consolidation, pleural effusion or pneumothorax. No acute fractures are seen. IMPRESSION: Cardiomegaly with central pulmonary vascular congestion and mild interstitial edema. Electronically Signed   By: Ronney Asters M.D.   On: 10/26/2021 17:27   CT ANGIO HEAD NECK W WO CM (CODE STROKE)  Result Date: 10/26/2021 CLINICAL DATA:  Acute neuro deficit rule out stroke.  Aphasia. EXAM: CT ANGIOGRAPHY HEAD AND NECK TECHNIQUE: Multidetector CT imaging of the head and neck was performed using the standard protocol during bolus administration of intravenous contrast. Multiplanar CT image reconstructions and MIPs were obtained to evaluate the vascular anatomy. Carotid stenosis measurements (when applicable) are obtained utilizing NASCET criteria, using the distal internal carotid diameter as the denominator. RADIATION DOSE REDUCTION: This exam was performed according to the departmental dose-optimization program which includes automated exposure control, adjustment of the mA and/or kV according to patient size and/or use of iterative reconstruction technique. CONTRAST:  60 mL OMNIPAQUE IOHEXOL 350 MG/ML SOLN COMPARISON:  CT head 10/26/2021 FINDINGS: CTA NECK FINDINGS Aortic arch: Aortic arch incompletely evaluated. Proximal great vessels widely patent. Right carotid system: Mild atherosclerotic calcification right carotid bifurcation. Negative for stenosis Left carotid system: Mild atherosclerotic calcification left carotid stenosis. Negative for stenosis Vertebral arteries: Both vertebral arteries patent to the basilar without stenosis Skeleton: Cervical spondylosis without acute skeletal abnormality. Other neck: Negative for mass or soft tissue swelling Upper chest: Apical emphysema and probable scarring. No acute abnormality Review of the MIP images confirms the above findings CTA HEAD FINDINGS Anterior circulation: Internal  carotid artery widely patent through the skull base and cavernous segment bilaterally. Minimal atherosclerotic disease in the cavernous carotid bilaterally. Anterior and middle cerebral arteries normal without stenosis or aneurysm Posterior circulation: Both vertebral arteries patent to the basilar. PICA patent bilaterally. PICA patent. AICA, superior cerebellar, posterior cerebral arteries patent without stenosis or aneurysm. Venous sinuses: Normal venous enhancement Anatomic variants: None Review of the MIP images confirms the above findings IMPRESSION: Negative for intracranial large vessel occlusion or significant stenosis. Mild atherosclerotic disease in the carotid bifurcation bilaterally. No significant carotid or vertebral artery stenosis in the neck. Electronically Signed   By: Franchot Gallo M.D.   On: 10/26/2021 16:33   CT HEAD CODE STROKE WO CONTRAST  Result Date: 10/26/2021 CLINICAL DATA:  Code stroke.  Acute neuro deficit.  Aphasia EXAM: CT HEAD WITHOUT CONTRAST TECHNIQUE: Contiguous axial images were obtained from the base of the skull through the vertex without intravenous contrast. RADIATION DOSE REDUCTION: This exam was performed according to the departmental dose-optimization program which includes automated exposure control, adjustment of the mA and/or kV according to patient size and/or use of iterative reconstruction technique. COMPARISON:  CT head 09/24/2020 FINDINGS: Brain: No evidence of acute infarction, hemorrhage, hydrocephalus, extra-axial collection or mass lesion/mass effect. Generalized atrophy. Patchy white matter hypodensity bilaterally is chronic and unchanged. Hypodensity left thalamus unchanged and consistent with chronic infarct. Vascular: Negative for hyperdense vessel Skull: No focal lesion. Sinuses/Orbits: Mucosal edema and bony thickening right maxillary sinus. Adjacent lucency in the right maxilla may be related to dental infection. Periapical lucency around left upper  premolar with mild mucosal edema left maxillary sinus. Bilateral cataract extraction Other: None ASPECTS ([place] Stroke Program Early CT Score) - Ganglionic level infarction (caudate, lentiform nuclei, internal capsule, insula, M1-M3 cortex): 7 - Supraganglionic infarction (M4-M6 cortex): 3 Total score (0-10 with 10 being normal): 10 IMPRESSION: 1. No acute intracranial abnormality. Atrophy and chronic microvascular ischemia. 2. Aspects is 10. 3. Code stroke imaging results were communicated on 10/26/2021 at 4:21 pm to provider Leonel Ramsay via secure text message Electronically Signed   By: Franchot Gallo M.D.   On: 10/26/2021 16:22   OCT, Retina - OU - Both Eyes  Result Date: 10/05/2021 Left Eye Quality was good. Scan locations included subfoveal. Central Foveal Thickness: 278. Progression has been stable. Findings include retinal drusen , vitreous traction, outer retinal atrophy, vitreomacular adhesion . Notes OD no view     Subjective: Patient seen along with his RN in the room.  Very anxious to be discharged right away.  States that PT was supposed to come in at 39 and they had still not been by and were late.  Denied any complaints.  Specifically denied fever, dyspnea, chest pain.  Reported that he had ambulated twice in the hallway with PT without any complaints.  Discharge Exam:  Vitals:   10/28/21 2047 10/28/21 2352 10/29/21 0317 10/29/21 0816  BP: (!) 121/58 (!) 92/44 126/66 (!) 140/72  Pulse: 79   74  Resp: 18 19 16 20   Temp: 98 F (36.7 C) 98 F (36.7 C) 98.1 F (36.7 C) (!) 97.4 F (36.3 C)  TempSrc: Oral Oral Oral Oral  SpO2: 98% 94% 94% 98%  Weight:        General exam: Elderly male, moderately built and nourished sitting up comfortably in reclining chair without distress.  Does not look septic or toxic. Respiratory system: Clear to auscultation.  No increased work of breathing Cardiovascular system: S1 & S2 heard, RRR. No JVD, murmurs, rubs, gallops or clicks. No pedal  edema.  Telemetry personally reviewed: Sinus rhythm. Gastrointestinal system: Abdomen is nondistended, soft and nontender. No organomegaly or masses felt. Normal bowel sounds heard. Central nervous system: Alert and oriented. No focal neurological deficits. Extremities: Symmetric 5 x 5 power. Skin: No rashes, lesions or ulcers Psychiatry: Judgement and insight appear normal. Mood & affect appropriate.       The results of significant diagnostics from this hospitalization (including imaging, microbiology, ancillary and laboratory) are listed below for reference.     Microbiology: Recent Results (from the past 240 hour(s))  Resp Panel by RT-PCR (Flu A&B, Covid) Anterior Nasal Swab     Status: Abnormal   Collection Time: 10/26/21  4:27 PM   Specimen: Anterior Nasal Swab  Result Value Ref  Range Status   SARS Coronavirus 2 by RT PCR POSITIVE (A) NEGATIVE Final    Comment: (NOTE) SARS-CoV-2 target nucleic acids are DETECTED.  The SARS-CoV-2 RNA is generally detectable in upper respiratory specimens during the acute phase of infection. Positive results are indicative of the presence of the identified virus, but do not rule out bacterial infection or co-infection with other pathogens not detected by the test. Clinical correlation with patient history and other diagnostic information is necessary to determine patient infection status. The expected result is Negative.  Fact Sheet for Patients: EntrepreneurPulse.com.au  Fact Sheet for Healthcare Providers: IncredibleEmployment.be  This test is not yet approved or cleared by the [place] FDA and  has been authorized for detection and/or diagnosis of SARS-CoV-2 by FDA under an Emergency Use Authorization (EUA).  This EUA will remain in effect (meaning this test can be used) for the duration of  the COVID-19 declaration under Section 564(b)(1) of the A ct, 21 U.S.C. section 360bbb-3(b)(1), unless  the authorization is terminated or revoked sooner.     Influenza A by PCR NEGATIVE NEGATIVE Final   Influenza B by PCR NEGATIVE NEGATIVE Final    Comment: (NOTE) The Xpert Xpress SARS-CoV-2/FLU/RSV plus assay is intended as an aid in the diagnosis of influenza from Nasopharyngeal swab specimens and should not be used as a sole basis for treatment. Nasal washings and aspirates are unacceptable for Xpert Xpress SARS-CoV-2/FLU/RSV testing.  Fact Sheet for Patients: EntrepreneurPulse.com.au  Fact Sheet for Healthcare Providers: IncredibleEmployment.be  This test is not yet approved or cleared by the [place] FDA and has been authorized for detection and/or diagnosis of SARS-CoV-2 by FDA under an Emergency Use Authorization (EUA). This EUA will remain in effect (meaning this test can be used) for the duration of the COVID-19 declaration under Section 564(b)(1) of the Act, 21 U.S.C. section 360bbb-3(b)(1), unless the authorization is terminated or revoked.  Performed at [facility], [place] [address], [place], [place] [address]   Blood Culture (routine x 2)     Status: None (Preliminary result)   Collection Time: 10/26/21  4:27 PM   Specimen: BLOOD  Result Value Ref Range Status   Specimen Description BLOOD BLOOD LEFT FOREARM  Final   Special Requests   Final    BOTTLES DRAWN AEROBIC AND ANAEROBIC Blood Culture adequate volume   Culture   Final    NO GROWTH 3 DAYS Performed at [facility], [place] [address], [place], [place] [address]    Report Status PENDING  Incomplete  Urine Culture     Status: None   Collection Time: 10/26/21  4:27 PM   Specimen: In/Out Cath Urine  Result Value Ref Range Status   Specimen Description IN/OUT CATH URINE  Final   Special Requests NONE  Final   Culture   Final    NO GROWTH Performed at [facility], [place] [address], [place], [place] [address]    Report Status 10/28/2021 FINAL  Final   Blood Culture (routine x 2)     Status: None (Preliminary result)   Collection Time: 10/26/21  4:43 PM   Specimen: BLOOD  Result Value Ref Range Status   Specimen Description BLOOD LEFT ANTECUBITAL  Final   Special Requests   Final    BOTTLES DRAWN AEROBIC AND ANAEROBIC Blood Culture adequate volume   Culture   Final    NO GROWTH 3 DAYS Performed at [facility], [place] [address], [place], [place]  [address]    Report Status PENDING  Incomplete     Labs: CBC: Recent Labs  Lab 10/26/21 1605 10/26/21 1610 10/27/21 0311 10/28/21 1041 10/29/21 0557  WBC 5.7  --  5.7 4.4 5.0  NEUTROABS 3.5  --  3.0 2.3 2.3  HGB 10.6* 11.2* 10.1* 10.6* 10.7*  HCT 32.3* 33.0* 29.8* 31.3* 30.9*  MCV 92.3  --  90.9 89.2 88.3  PLT 158  --  137* 141* 374    Basic Metabolic Panel: Recent Labs  Lab 10/26/21 1605 10/26/21 1610 10/26/21 1852 10/27/21 0311 10/28/21 1041 10/29/21 0557  NA 135 136  --  136 139 136  K 3.2* 3.2*  --  3.8 3.3* 3.7  CL 102 98  --  103 106 104  CO2 24  --   --  26 24 23   GLUCOSE 113* 108*  --  114* 130* 102*  BUN 11 12  --  10 9 11   CREATININE 1.04 1.00  --  0.82 0.97 0.97  CALCIUM 8.2*  --   --  8.4* 8.1* 8.2*  MG  --   --  1.2* 1.9 1.8  --     Liver Function Tests: Recent Labs  Lab 10/26/21 1605 10/27/21 0311 10/28/21 1041  AST 16 22 23   ALT 9 9 10   ALKPHOS 57 48 47  BILITOT 0.9 1.4* 0.8  PROT 6.2* 5.3* 5.5*  ALBUMIN 3.2* 2.6* 2.6*    CBG: Recent Labs  Lab 10/26/21 1604 10/27/21 1645  GLUCAP 96 132*    Thyroid function studies Recent Labs    10/26/21 2200  TSH 1.747    Anemia work up Recent Labs    10/26/21 1852 10/27/21 1853  VITAMINB12  --  266  FERRITIN 107  --     Urinalysis    Component Value Date/Time   COLORURINE YELLOW 10/26/2021 [place] 10/26/2021 1559   LABSPEC 1.014 10/26/2021 1559   PHURINE 5.0 10/26/2021 1559   GLUCOSEU NEGATIVE 10/26/2021 1559   GLUCOSEU NEGATIVE 05/08/2019 1623   HGBUR  NEGATIVE 10/26/2021 [place] 10/26/2021 1559   KETONESUR NEGATIVE 10/26/2021 1559   PROTEINUR NEGATIVE 10/26/2021 1559   UROBILINOGEN 0.2 05/08/2019 1623   NITRITE NEGATIVE 10/26/2021 1559   LEUKOCYTESUR NEGATIVE 10/26/2021 1559    Discussed with patient's daughter via phone, updated care and answered all questions.  Time coordinating discharge: 25 minutes  SIGNED:  Vernell Leep, MD,  FACP, [facility], [facility], [facility], [facility]   [facility] [place]     To contact the attending provider between 7A-7P or the covering provider during after hours 7P-7A, please log into the web site [email or web address] and access using universal [place] password for that web site. If you do not have the password, please call the hospital operator.

## 2021-10-29 NOTE — TOC Transition Note (Addendum)
Transition of Care [facility]) - CM/SW Discharge Note   Patient Details  Name: ORHAN MAYORGA MRN: [id number] Date of Birth: Mar 09, 1932  Transition of Care [facility]) CM/SW Contact:  Pollie Friar, RN Phone Number: 10/29/2021, 10:42 AM   Clinical Narrative:    Pt has done much better late yesterday and this am. PT says no f/u. Pt has all needed DME at home.  Pt daughter updated and will follow up on his at home.  Pts son to provide transportation home.   1237: pt refused outpatient therapy.  Daughter updated on what to monitor at home.    Final next level of care: Home/Self Care Barriers to Discharge: No Barriers Identified   Patient Goals and CMS Choice   CMS Medicare.gov Compare Post Acute Care list provided to:: Patient Choice offered to / list presented to : Patient, Adult Children  Discharge Placement                       Discharge Plan and Services   Discharge Planning Services: CM Consult Post Acute Care Choice: Home Health                               Social Determinants of Health (SDOH) Interventions     Readmission Risk Interventions     No data to display

## 2021-10-29 NOTE — Progress Notes (Signed)
Physical Therapy Treatment Patient Details Name: Michael [name] MRN: [id number] DOB: [birth year] Today's Date: 10/29/2021   History of Present Illness 88 yo male with onset of memory changes and AMS was brought to hosp 10/17 after finding pt could not talk, lift his arrms or perform simple motor tasks.  Pt resolved aphasia, cleared for stroke but has PAF with RVR, Covid infection.  PMHx:  TIA, B12 defic, and glaucoma.    PT Comments    Pt progressing well with mobility. He demonstrates mod I bed mobility. Supervision provided for transfers and ambulation 600' with RW. Pt reports having all needed DME at home. He appears to be at or near his baseline for mobility. Discharge recommendation updated to no PT follow up.    Recommendations for follow up therapy are one component of a multi-disciplinary discharge planning process, led by the attending physician.  Recommendations may be updated based on patient status, additional functional criteria and insurance authorization.  Follow Up Recommendations  No PT follow up Can patient physically be transported by private vehicle: Yes   Assistance Recommended at Discharge PRN  Patient can return home with the following Assist for transportation;Assistance with cooking/housework   Equipment Recommendations  None recommended by PT    Recommendations for Other Services       Precautions / Restrictions Precautions Precautions: Fall Restrictions Weight Bearing Restrictions: No     Mobility  Bed Mobility Overal bed mobility: Modified Independent             General bed mobility comments: Upon entering room, pt standing, making his bed.    Transfers Overall transfer level: Needs assistance Equipment used: Ambulation equipment used Transfers: Sit to/from Stand Sit to Stand: Supervision           General transfer comment: supervision for safety, moves quickly    Ambulation/Gait Ambulation/Gait assistance: Supervision Gait  Distance (Feet): 600 Feet Assistive device: Rolling walker (2 wheels) Gait Pattern/deviations: Step-through pattern, Trunk flexed, Decreased stride length Gait velocity: WFL Gait velocity interpretation: >2.62 ft/sec, indicative of community ambulatory   General Gait Details: steady gait with RW   Stairs             Wheelchair Mobility    Modified Rankin (Stroke Patients Only)       Balance Overall balance assessment: Mild deficits observed, not formally tested                                          Cognition Arousal/Alertness: Awake/alert Behavior During Therapy: Impulsive, Restless Overall Cognitive Status: No family/caregiver present to determine baseline cognitive functioning                                 General Comments: A&O x 4. Very active at baseline and gets restless with sitting still. Mild deficits in safety awareness. Suspect he is near or at baseline.        Exercises      General Comments General comments (skin integrity, edema, etc.): HR 110s-120s. SpO2 stable on RA      Pertinent Vitals/Pain Pain Assessment Pain Assessment: No/denies pain    Home Living                          Prior Function  PT Goals (current goals can now be found in the care plan section) Acute Rehab PT Goals Patient Stated Goal: home Progress towards PT goals: Progressing toward goals    Frequency    Min 3X/week      PT Plan Discharge plan needs to be updated    Co-evaluation              AM-PAC PT "6 Clicks" Mobility   Outcome Measure  Help needed turning from your back to your side while in a flat bed without using bedrails?: None Help needed moving from lying on your back to sitting on the side of a flat bed without using bedrails?: None Help needed moving to and from a bed to a chair (including a wheelchair)?: A Little Help needed standing up from a chair using your arms (e.g.,  wheelchair or bedside chair)?: A Little Help needed to walk in hospital room?: A Little Help needed climbing 3-5 steps with a railing? : A Little 6 Click Score: 20    End of Session   Activity Tolerance: Patient tolerated treatment well Patient left: in bed;with call bell/phone within reach Nurse Communication: Mobility status PT Visit Diagnosis: Unsteadiness on feet (R26.81);Muscle weakness (generalized) (M62.81)     Time: 4536-4680 PT Time Calculation (min) (ACUTE ONLY): 24 min  Charges:  $Gait Training: 23-37 mins                     Lorrin Goodell, [place]  Office # [phone] Pager [phone]    Lorriane Shire 10/29/2021, 10:47 AM

## 2021-10-29 NOTE — Discharge Instructions (Signed)

## 2021-10-31 LAB — CULTURE, BLOOD (ROUTINE X 2)
Culture: NO GROWTH
Culture: NO GROWTH
Special Requests: ADEQUATE
Special Requests: ADEQUATE

## 2021-11-01 ENCOUNTER — Other Ambulatory Visit (INDEPENDENT_AMBULATORY_CARE_PROVIDER_SITE_OTHER): Payer: Medicare Other

## 2021-11-01 DIAGNOSIS — E538 Deficiency of other specified B group vitamins: Secondary | ICD-10-CM

## 2021-11-01 DIAGNOSIS — D509 Iron deficiency anemia, unspecified: Secondary | ICD-10-CM | POA: Diagnosis not present

## 2021-11-01 LAB — CBC WITH DIFFERENTIAL/PLATELET
Basophils Absolute: 0.1 10*3/uL (ref 0.0–0.1)
Basophils Relative: 1 % (ref 0.0–3.0)
Eosinophils Absolute: 0.1 10*3/uL (ref 0.0–0.7)
Eosinophils Relative: 2.7 % (ref 0.0–5.0)
HCT: 34.2 % — ABNORMAL LOW (ref 39.0–52.0)
Hemoglobin: 11.4 g/dL — ABNORMAL LOW (ref 13.0–17.0)
Lymphocytes Relative: 27.8 % (ref 12.0–46.0)
Lymphs Abs: 1.5 10*3/uL (ref 0.7–4.0)
MCHC: 33.4 g/dL (ref 30.0–36.0)
MCV: 89.7 fl (ref 78.0–100.0)
Monocytes Absolute: 0.5 10*3/uL (ref 0.1–1.0)
Monocytes Relative: 10 % (ref 3.0–12.0)
Neutro Abs: 3.2 10*3/uL (ref 1.4–7.7)
Neutrophils Relative %: 58.5 % (ref 43.0–77.0)
Platelets: 200 10*3/uL (ref 150.0–400.0)
RBC: 3.81 Mil/uL — ABNORMAL LOW (ref 4.22–5.81)
RDW: 15.5 % (ref 11.5–15.5)
WBC: 5.4 10*3/uL (ref 4.0–10.5)

## 2021-11-01 LAB — IBC PANEL
Iron: 87 ug/dL (ref 42–165)
Saturation Ratios: 34.9 % (ref 20.0–50.0)
TIBC: 249.2 ug/dL — ABNORMAL LOW (ref 250.0–450.0)
Transferrin: 178 mg/dL — ABNORMAL LOW (ref 212.0–360.0)

## 2021-11-01 LAB — FERRITIN: Ferritin: 169.2 ng/mL (ref 22.0–322.0)

## 2021-11-01 LAB — VITAMIN B12: Vitamin B-12: 391 pg/mL (ref 211–911)

## 2021-11-02 ENCOUNTER — Ambulatory Visit: Payer: Medicare Other | Admitting: Cardiovascular Disease

## 2021-11-15 ENCOUNTER — Ambulatory Visit
Payer: No Typology Code available for payment source | Attending: Cardiovascular Disease | Admitting: Cardiovascular Disease

## 2021-11-15 ENCOUNTER — Encounter: Payer: Self-pay | Admitting: Cardiovascular Disease

## 2021-11-15 VITALS — BP 102/68 | HR 64 | Ht 71.0 in | Wt 166.2 lb

## 2021-11-15 DIAGNOSIS — I480 Paroxysmal atrial fibrillation: Secondary | ICD-10-CM

## 2021-11-15 MED ORDER — METOPROLOL SUCCINATE ER 25 MG PO TB24: 12.5000 mg | ORAL_TABLET | Freq: Every day | ORAL | 3 refills | Status: DC

## 2021-11-15 NOTE — Progress Notes (Signed)
Chief Complaint  Patient presents with   Follow-up    Atrial fib   History of Present Illness: 88 yo male with history of atrial fibrillation, TIA, memory loss, DVT, adrenal insufficiency, Crohn's disease and mild carotid artery disease here today for cardiac follow up. I saw him as a new patient in November 2021 after he had been admitted to [facility] 09/11/19 with a TIA. Carotid artery dopplers 09/11/19 with mild bilateral carotid artery disease. Echo September 2021 with LVEF=55-60%, no valve disease. CT showed probably remote L thalamic lacunar infarct which was confirmed on MRI brain. Cardiac monitor showed atrial fibrillation. He was started on Eliquis and a beta blocker. He was admitted to [facility] September 2022 with balance issues but workup for stroke was negative with brain MRI. Echo September 2022 with LVEF=55-60%. Trivial AI and MR. He was admitted to [facility] 10/26/21 with altered mental status and was found to have pneumonia, Covid positive.  He was febrile during the admission and has rapid atrial fib. He converted to sinus with IV Cardizem.   He is here today for follow up. The patient denies any chest pain, dyspnea, palpitations, lower extremity edema, orthopnea, PND, dizziness, near syncope or syncope.   Primary Care Physician: Binnie Rail, MD  Past Medical History:  Diagnosis Date   Adrenal insufficiency [facility])    Anemia    Atrial fibrillation ([place])    Avascular necrosis ([place])    Clavicle fracture 11/02/2017   Colon polyp    Colon polyps    Crohn's disease ([place])    Glaucoma    Inguinal hernia recurrent bilateral    Low testosterone    Osteopenia    Retinal ischemia     Past Surgical History:  Procedure Laterality Date   ABDOMINAL SURGERY     BIOPSY  03/19/2020   Procedure: BIOPSY;  Surgeon: Yetta Flock, MD;  Location: [facility];  Service: Gastroenterology;;   COLONOSCOPY N/A 12/13/2015   Procedure: COLONOSCOPY;  Surgeon: Laurence Spates, MD;  Location: [facility];   Service: Endoscopy;  Laterality: N/A;   COLONOSCOPY WITH PROPOFOL N/A 03/25/2019   Procedure: COLONOSCOPY WITH PROPOFOL;  Surgeon: Yetta Flock, MD;  Location: [facility];  Service: Gastroenterology;  Laterality: N/A;   COLONOSCOPY WITH PROPOFOL N/A 08/26/2019   Procedure: COLONOSCOPY WITH PROPOFOL;  Surgeon: Yetta Flock, MD;  Location: [facility];  Service: Gastroenterology;  Laterality: N/A;   COLONOSCOPY WITH PROPOFOL N/A 03/19/2020   Procedure: COLONOSCOPY WITH PROPOFOL;  Surgeon: Yetta Flock, MD;  Location: [facility];  Service: Gastroenterology;  Laterality: N/A;   ENDOSCOPIC MUCOSAL RESECTION N/A 08/26/2019   Procedure: ENDOSCOPIC MUCOSAL RESECTION;  Surgeon: Yetta Flock, MD;  Location: [facility];  Service: Gastroenterology;  Laterality: N/A;   INGUINAL HERNIA REPAIR Bilateral 05/21/2014   Procedure: OPEN BILATERAL INGUINAL HERNIA REPAIRS WITH MESH;  Surgeon: Donnie Mesa, MD;  Location: [facility];  Service: General;  Laterality: Bilateral;   POLYPECTOMY  03/25/2019   Procedure: POLYPECTOMY;  Surgeon: Yetta Flock, MD;  Location: [facility];  Service: Gastroenterology;;   POLYPECTOMY  08/26/2019   Procedure: POLYPECTOMY;  Surgeon: Yetta Flock, MD;  Location: [facility];  Service: Gastroenterology;;   POLYPECTOMY  03/19/2020   Procedure: POLYPECTOMY;  Surgeon: Yetta Flock, MD;  Location: [facility];  Service: Gastroenterology;;   Woodward Ku  08/26/2019   Procedure: Sprayed methylene blue;  Surgeon: Yetta Flock, MD;  Location: [facility];  Service: Gastroenterology;;   SMALL INTESTINE SURGERY  1968, 2001    related to Chrohn's disease   TONSILLECTOMY      Current Outpatient Medications  Medication Sig Dispense Refill   acetaminophen (TYLENOL) 500 MG tablet Take 500-1,000 mg by mouth every 8 (eight) hours as needed for mild pain or headache.      apixaban (ELIQUIS) 5 MG TABS tablet Take 1  tablet (5 mg total) by mouth 2 (two) times daily. 60 tablet 5   Ascorbic Acid (VITAMIN C) 1000 MG tablet Take 1,000 mg by mouth daily with breakfast.      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      brimonidine (ALPHAGAN) 0.2 % ophthalmic solution Place 1 drop into the left eye in the morning and at bedtime. Instill 1 drop into the left eye twice a day- 6:45 AM and 5:50 PM     calcium carbonate (CALCIUM 600) 600 MG TABS tablet Take 1 tablet (600 mg total) by mouth daily. 30 tablet    Cholecalciferol (VITAMIN D3) 50 MCG (2000 UT) TABS Take 2,000 Units by mouth daily with breakfast.     cyanocobalamin (,VITAMIN B-12,) 1000 MCG/ML injection INJECT 1ML INTO THE MUSCLE ONCE EVERY THIRTY DAYS (Patient taking differently: Inject 1,000 mcg into the skin every 30 (thirty) days.) 10 mL 0   dorzolamide-timolol (COSOPT) 22.3-6.8 MG/ML ophthalmic solution Place 1 drop into the left eye 2 (two) times daily.     ferrous sulfate 325 (65 FE) MG tablet Take 325 mg by mouth 3 (three) times a week. In the morning     latanoprost (XALATAN) 0.005 % ophthalmic solution Place 1 drop into the left eye at bedtime.     loperamide (IMODIUM A-D) 2 MG tablet Take 2 mg by mouth daily as needed for diarrhea or loose stools.     Netarsudil Dimesylate 0.02 % SOLN Place 1 drop into the left eye at bedtime.     Syringe/Needle, Disp, (SYRINGE 3CC/25GX1") 25G X 1" 3 ML MISC Use for monthly B12 injections 12 each 0   tamsulosin (FLOMAX) 0.4 MG CAPS capsule Take 0.4 mg by mouth at bedtime.     metoprolol succinate (TOPROL-XL) 25 MG 24 hr tablet Take 0.5 tablets (12.5 mg total) by mouth daily. 45 tablet 3   omeprazole (PRILOSEC OTC) 20 MG tablet Take 20 mg by mouth daily before breakfast. (Patient not taking: Reported on 11/15/2021)     No current facility-administered medications for this visit.    No Known Allergies  Social  History   Socioeconomic History   Marital status: Married    Spouse name: Not on file   Number of children: 3   Years of education: Not on file   Highest education level: Not on file  Occupational History   Occupation: Retired  Tobacco Use   Smoking status: Former    Types: Cigarettes    Quit date: 04/01/1976    Years since quitting: 45.6   Smokeless tobacco: Never  Vaping Use   Vaping Use: Never used  Substance and Sexual Activity   Alcohol use: Yes    Alcohol/week: 2.0 standard drinks of alcohol    Types: 2 Glasses of wine per week    Comment: social   Drug use: No   Sexual activity: Never  Other Topics Concern   Not on file  Social History Narrative   Not on file   Social Determinants of Health   Financial Resource Strain: Not on  file  Food Insecurity: Not on file  Transportation Needs: Not on file  Physical Activity: Not on file  Stress: Not on file  Social Connections: Not on file  Intimate Partner Violence: Not on file    Family History  Problem Relation Age of Onset   Deep vein thrombosis Mother    Transient ischemic attack Mother    Heart disease Father    Cancer Maternal Aunt        unknown type; dx after 40   Cancer Cousin        maternal cousin; unknown type   Cancer Cousin        paternal cousin; unknown type; dx after 90   Stomach cancer Neg Hx    Colon cancer Neg Hx    Esophageal cancer Neg Hx    Pancreatic cancer Neg Hx    Rectal cancer Neg Hx    Colon polyps Neg Hx     Review of Systems:  As stated in the HPI and otherwise negative.   BP 102/68   Pulse 64   Ht 5' 11"  (1.803 m)   Wt 166 lb 3.2 oz (75.4 kg)   SpO2 97%   BMI 23.18 kg/m   Physical Examination:  General: Well developed, well nourished, NAD  HEENT: OP clear, mucus membranes moist  SKIN: warm, dry. No rashes. Neuro: No focal deficits  Musculoskeletal: Muscle strength 5/5 all ext  Psychiatric: Mood and affect normal  Neck: No JVD, no carotid bruits, no thyromegaly,  no lymphadenopathy.  Lungs:Clear bilaterally, no wheezes, rhonci, crackles Cardiovascular: Regular rate and rhythm. No murmurs, gallops or rubs. Abdomen:Soft. Bowel sounds present. Non-tender.  Extremities: No lower extremity edema. Pulses are 2 + in the bilateral DP/PT.  EKG:  EKG is not ordered today. The ekg ordered today demonstrates    Echo September 2022: Recent Labs: 10/26/2021: TSH 1.747 10/27/2021: B Natriuretic Peptide 113.5 10/28/2021: ALT 10; Magnesium 1.8 10/29/2021: BUN 11; Creatinine, Ser 0.97; Potassium 3.7; Sodium 136 11/01/2021: Hemoglobin 11.4; Platelets 200.0   Lipid Panel    Component Value Date/Time   CHOL 118 07/27/2021 1207   TRIG 176.0 (H) 07/27/2021 1207   HDL 33.10 (L) 07/27/2021 1207   CHOLHDL 4 07/27/2021 1207   VLDL 35.2 07/27/2021 1207   LDLCALC 50 07/27/2021 1207   LDLDIRECT 60.6 09/11/2019 1401     Wt Readings from Last 3 Encounters:  11/15/21 166 lb 3.2 oz (75.4 kg)  10/26/21 171 lb 1.2 oz (77.6 kg)  09/27/21 169 lb 6.4 oz (76.8 kg)    Assessment and Plan:   1. Atrial fibrillation, paroxysmal: Sinus today. Continue beta blocker and Eliquis. His Toprol was increased to 25 mg per day in the hospital but will cut back down to 12.5 mg per day.   Labs/ tests ordered today include:  No orders of the defined types were placed in this encounter.   Disposition:   F/U with me in 12 months.   Signed, Lauree Chandler, MD 11/15/2021 4:20 PM    [facility] [address], [place], [place]  [address] Phone: [phone]; Fax: [phone]

## 2021-11-15 NOTE — Patient Instructions (Signed)
Medication Instructions:  No changes *If you need a refill on your cardiac medications before your next appointment, please call your pharmacy*   Lab Work: none If you have labs (blood work) drawn today and your tests are completely normal, you will receive your results only by: MyChart Message (if you have MyChart) OR A paper copy in the mail If you have any lab test that is abnormal or we need to change your treatment, we will call you to review the results.   Testing/Procedures: none   Follow-Up: At [facility], you and your health needs are our priority.  As part of our continuing mission to provide you with exceptional heart care, we have created designated Provider Care Teams.  These Care Teams include your primary Cardiologist (physician) and Advanced Practice Providers (APPs -  Physician Assistants and Nurse Practitioners) who all work together to provide you with the care you need, when you need it.   Your next appointment:   12 month(s)  The format for your next appointment:   In Person  Provider:   Christopher McAlhany, MD     Important Information About Sugar       

## 2021-12-14 ENCOUNTER — Other Ambulatory Visit: Payer: Self-pay | Admitting: Gastroenterology

## 2021-12-15 ENCOUNTER — Other Ambulatory Visit: Payer: Self-pay | Admitting: Internal Medicine

## 2021-12-29 ENCOUNTER — Ambulatory Visit: Payer: Medicare Other | Admitting: Cardiovascular Disease

## 2021-12-30 DIAGNOSIS — H401133 Primary open-angle glaucoma, bilateral, severe stage: Secondary | ICD-10-CM | POA: Diagnosis not present

## 2021-12-30 DIAGNOSIS — H43822 Vitreomacular adhesion, left eye: Secondary | ICD-10-CM | POA: Diagnosis not present

## 2021-12-30 DIAGNOSIS — H35351 Cystoid macular degeneration, right eye: Secondary | ICD-10-CM | POA: Diagnosis not present

## 2021-12-30 DIAGNOSIS — H353221 Exudative age-related macular degeneration, left eye, with active choroidal neovascularization: Secondary | ICD-10-CM | POA: Diagnosis not present

## 2022-01-17 ENCOUNTER — Encounter: Payer: Self-pay | Admitting: Gastroenterology

## 2022-02-13 ENCOUNTER — Other Ambulatory Visit: Payer: Self-pay | Admitting: Internal Medicine

## 2022-02-14 ENCOUNTER — Other Ambulatory Visit: Payer: Self-pay

## 2022-02-17 ENCOUNTER — Encounter (HOSPITAL_COMMUNITY): Payer: Self-pay | Admitting: *Deleted

## 2022-03-22 ENCOUNTER — Encounter: Payer: Self-pay | Admitting: Gastroenterology

## 2022-03-22 ENCOUNTER — Ambulatory Visit: Payer: Medicare Other | Admitting: Gastroenterology

## 2022-03-22 VITALS — BP 126/70 | HR 56 | Ht 71.0 in | Wt 171.2 lb

## 2022-03-22 DIAGNOSIS — Z86010 Personal history of colonic polyps: Secondary | ICD-10-CM | POA: Diagnosis not present

## 2022-03-22 DIAGNOSIS — K50019 Crohn's disease of small intestine with unspecified complications: Secondary | ICD-10-CM

## 2022-03-22 DIAGNOSIS — Z7901 Long term (current) use of anticoagulants: Secondary | ICD-10-CM

## 2022-03-22 NOTE — Patient Instructions (Signed)
Please follow up in 6 months.  Thank you for entrusting me with your care and for choosing [facility], Dr. La Fontaine Cellar    If your blood pressure at your visit was 140/90 or greater, please contact your primary care physician to follow up on this.  _______________________________________________________  If you are age 87 or older, your body mass index should be between 23-30. Your Body mass index is 23.88 kg/m. If this is out of the aforementioned range listed, please consider follow up with your Primary Care Provider.  If you are age 14 or younger, your body mass index should be between 19-25. Your Body mass index is 23.88 kg/m. If this is out of the aformentioned range listed, please consider follow up with your Primary Care Provider.   ________________________________________________________  The [facility] providers would like to encourage you to use [facility] to communicate with providers for non-urgent requests or questions.  Due to long hold times on the telephone, sending your provider a message by [facility] may be a faster and more efficient way to get a response.  Please allow 48 business hours for a response.  Please remember that this is for non-urgent requests.  _______________________________________________________  Due to recent changes in healthcare laws, you may see the results of your imaging and laboratory studies on MyChart before your provider has had a chance to review them.  We understand that in some cases there may be results that are confusing or concerning to you. Not all laboratory results come back in the same time frame and the provider may be waiting for multiple results in order to interpret others.  Please give [place] 48 hours in order for your provider to thoroughly review all the results before contacting the office for clarification of your results.

## 2022-03-22 NOTE — Progress Notes (Signed)
HPI :  89 year old male here for follow-up for Crohn's disease and history of colon polyps.    Crohn's history: Previously followed by Dr. Earle Gell. 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 He has had 2 operations for his Crohns in 1968 and then again 2001 or so for small bowel resections. He remotely was treated with prednisone as needed in the past. He was never been on any other maintenance therapy for Crohn's disease other than prednisone and surgery. He has had hospitalizations in the past for bowel obstructions. Unfortunately he has suffered complications from chronic steroid use. He is currently being followed by Endocrinology for adrenal insufficiency. He has also been noted to have hepatic steatosis and avascular necrosis of the hips on CT scan. His course has also been complicated by history of diverticulosis related bleeding. Most recently has been using budesonide for flares as needed, and noted to have numerous colon polyps on multiple colonoscopy exams.    SINCE LAST VISIT:   89 year old male here for a follow-up visit for small bowel Crohn's disease, history of colon polyps.  I last saw him in May 2023.  Since that time he has not really had too many issues with his Crohn's disease.  He was admitted to the hospital in October with COVID and encephalopathy, had A-fib with RVR, remains on Eliquis for that.  He recovered from his hospitalization.  Historically recall that he has not wish to pursue biologic therapy and we have been using budesonide as needed for flares of symptoms which are namely abdominal pain/obstructive symptoms.  He really had not needed that at all within the past year until he states actually just about a week ago.  He states he was having occasional pains and soreness in his mid abdomen consistent with Crohn's flare as he has had in the past.  He has budesonide at home to take for flares as needed.  He has been on it for the past week and states he has  been feeling much better and his pain has mostly resolved.  He usually takes this for a few weeks and then stops it.  He thinks this is the first course she has had over the past year or so.  He is not having any nausea or vomiting, no obstructive symptoms, no diarrhea.  He is taking vitamin B12, vitamin D.  He is very active, walks at the gym, very functional.  His weight is stable.  We have discussed his colonoscopies in the past.  Recall he has had numerous large polyps removed at the hospital in the past, difficulty with bowel prep and clear visualization.  He has had polyps near or at the anastomosis of his small intestine and very challenging to remove.  I have debulked/removed these as best I can in the past.  We have stopped doing further surveillance exams given his age and comorbidities.  Prior evaluation: Colonoscopy 12/2015 - [name] - "normal colon", patent ileocolonic anastomosis, active Crohn's ileitis   Colonoscopy 01/24/2019  - Preparation of the colon was fair. - Crohn's disease with mild ileitis. - Patent end-to-side ileo-colonic anastomosis, characterized by mild stenosis. Biopsied. - Abnormal mucosa in the ascending colon. Biopsied. - Two 3 to 6 mm polyps in the ascending colon, removed with a cold snare. Resected and retrieved. - Eight 4 to 12 mm polyps in the transverse colon, removed with a cold snare. Resected and retrieved. - One large polyp in the transverse colon. Tattooed. Not removed as outlined above -  One 10 mm polyp at the splenic flexure, removed with a cold snare. Resected and retrieved. - One 10 mm polyp in the descending colon, removed with a cold snare. Resected and retrieved. - Five 5 to 10 mm polyps in the sigmoid colon, removed with a cold snare. Resected and retrieved. - Two 4 mm polyps polyps at the recto-sigmoid colon, removed with a cold snare. Resected and retrieved. - Diverticulosis in the entire examined colon. - Stool in the entire examined  colon. - Colonic spasm. - The examination was otherwise normal.   Most polyps adenomatous Biopsies of right sided abnormality adenomatous   Colonoscopy 03/25/19  - Preparation of the colon was fair with significant spasm which prolonged this exam - Active ileal Crohn's disease as described - [name] i2. - Patent end-to-side ileo-colonic anastomosis, characterized by inflammation. - Numerous flat polyps noted in the ascending colon and proximal transverse colon as noted. Several minutes spent lavaging the colon and methylene blue used to evaluate the right colon to better delineate some of these especially the largest lesion which may extend into the surgical anastomosis. Largest transverse polyp removed as outlined, with another large transverse polyp noted today which was not removed given duration of procedure and colonic spasm.   FINAL MICROSCOPIC DIAGNOSIS:   A. COLON, RIGHT, POLYPECTOMY:  - Tubular adenoma (x2 fragments).  - No high grade dysplasia or malignancy.   B. COLON, RIGHT NEAR ANASTAMOSIS, POLYPECTOMY:  - Tubular adenoma (multiple fragments).  - No high grade dysplasia or malignancy.   C. COLON, PROXIMAL TRANSVERSE, POLYPECTOMY:  - Tubular adenoma (multiple fragments).  - No high grade dysplasia or malignancy.   D. COLON, TRANSVERSE, LARGE, POLYPECTOMY:  - Tubular adenoma (multiple fragments).  - No high grade dysplasia or malignancy.      Colonoscopy 08/26/19 - Crohn's disease with mildly active ileitis. - Patent end-to-side ileo-colonic anastomosis. - Prior polypectomy site in the ascending colon, unclear if residual polyp vs. normal variant ileal tissue given location at the anatomosis. Area removed piecemeal using a cold snare. Resected and retrieved. - Chromoendoscopy applied to the proximal transverse and right colon. - Two 5 to 20 mm polyps in the transverse colon, removed piecemeal using a cold snare. Resected and retrieved. - Diverticulosis in the  transverse colon and in the left colon. - The examination was otherwise normal. - Spatic colon treated with glucagon. Overall, very challenging exam, time needed to clear the colon, visualization difficult given spasm. I think the colon at this point has been cleared of all high risk lesions however difficult to say if there is residual / recurrence at the polyp site near the anastomosis. Under normal circumstances surgery would be recommended to remove right colon and anastomosis however given patient's age he has elected for colonoscopy surveillance.    FINAL MICROSCOPIC DIAGNOSIS:   A. COLON, RIGHT, SNARE POLYPECTOMY:  - Tubular adenoma(s)  - Negative for high-grade dysplasia or malignancy   B. COLON, TRANSVERSE, SNARE POLYPECTOMY:  - Tubular adenoma(s)  - Negative for high-grade dysplasia or malignancy   C. COLON, TRANSVERSE, SNARE POLYPECTOMY:  - Tubular adenoma(s)  - Negative for high-grade dysplasia or malignancy     Colonoscopy 03/19/20 - - Crohn's disease with mildly active ileitis. No overt polypoid tissue at the entrance of the ileum but multiple biopsies taken. - Patent end-to-end ileo-colonic anastomosis. - ? polypoid lesion in the ascending colon just distal to the anastomosis, in close approximation, vs. scar tissue or normal variant as above, very difficult to see  clear borders. Area was removed with cold snare, - Diverticulosis in the transverse colon and in the left colon. - The examination was otherwise normal. No high risk lesions otherwise appreciated.   FINAL MICROSCOPIC DIAGNOSIS:   A. COLON, ASCENDING, POLYPECTOMY:  - Fragments of polypoid colonic mucosa showing low-grade dysplasia with  a tubular architecture.  See comment  - Inflammatory polyp  - Other fragments of polypoid colonic mucosa with no specific  histopathologic changes   B. SURGICAL ANASTAMOSIS, BIOPSY:  - Colonic mucosa with nonspecific inflammatory and architectural  changes,  consistent with anastomotic site  - Negative for granulomas or dysplasia    Echo 09/23/20 - EF 55-60%, grade I DD    Past Medical History:  Diagnosis Date   Adrenal insufficiency (HCC)    Anemia    Atrial fibrillation (HCC)    Avascular necrosis (HCC)    Clavicle fracture 11/02/2017   Colon polyp    Colon polyps    Crohn's disease ([place])    Glaucoma    Inguinal hernia recurrent bilateral    Low testosterone    Osteopenia    Retinal ischemia      Past Surgical History:  Procedure Laterality Date   ABDOMINAL SURGERY     BIOPSY  03/19/2020   Procedure: BIOPSY;  Surgeon: Yetta Flock, MD;  Location: [facility];  Service: Gastroenterology;;   COLONOSCOPY N/A 12/13/2015   Procedure: COLONOSCOPY;  Surgeon: Laurence Spates, MD;  Location: [facility];  Service: Endoscopy;  Laterality: N/A;   COLONOSCOPY WITH PROPOFOL N/A 03/25/2019   Procedure: COLONOSCOPY WITH PROPOFOL;  Surgeon: Yetta Flock, MD;  Location: [facility];  Service: Gastroenterology;  Laterality: N/A;   COLONOSCOPY WITH PROPOFOL N/A 08/26/2019   Procedure: COLONOSCOPY WITH PROPOFOL;  Surgeon: Yetta Flock, MD;  Location: [facility];  Service: Gastroenterology;  Laterality: N/A;   COLONOSCOPY WITH PROPOFOL N/A 03/19/2020   Procedure: COLONOSCOPY WITH PROPOFOL;  Surgeon: Yetta Flock, MD;  Location: [facility];  Service: Gastroenterology;  Laterality: N/A;   ENDOSCOPIC MUCOSAL RESECTION N/A 08/26/2019   Procedure: ENDOSCOPIC MUCOSAL RESECTION;  Surgeon: Yetta Flock, MD;  Location: [facility];  Service: Gastroenterology;  Laterality: N/A;   INGUINAL HERNIA REPAIR Bilateral 05/21/2014   Procedure: OPEN BILATERAL INGUINAL HERNIA REPAIRS WITH MESH;  Surgeon: Donnie Mesa, MD;  Location: [facility];  Service: General;  Laterality: Bilateral;   POLYPECTOMY  03/25/2019   Procedure: POLYPECTOMY;  Surgeon: Yetta Flock, MD;  Location: [facility];  Service:  Gastroenterology;;   POLYPECTOMY  08/26/2019   Procedure: POLYPECTOMY;  Surgeon: Yetta Flock, MD;  Location: [facility];  Service: Gastroenterology;;   POLYPECTOMY  03/19/2020   Procedure: POLYPECTOMY;  Surgeon: Yetta Flock, MD;  Location: [facility];  Service: Gastroenterology;;   Woodward Ku  08/26/2019   Procedure: Sprayed methylene blue;  Surgeon: Yetta Flock, MD;  Location: [facility];  Service: Gastroenterology;;   SMALL INTESTINE SURGERY  1968, 2001    related to Chrohn's disease   TONSILLECTOMY     Family History  Problem Relation Age of Onset   Deep vein thrombosis Mother    Transient ischemic attack Mother    Heart disease Father    Cancer Maternal Aunt        unknown type; dx after 59   Cancer Cousin        maternal cousin; unknown type   Cancer Cousin        paternal cousin; unknown type; dx after 9  Stomach cancer Neg Hx    Colon cancer Neg Hx    Esophageal cancer Neg Hx    Pancreatic cancer Neg Hx    Rectal cancer Neg Hx    Colon polyps Neg Hx    Social History   Tobacco Use   Smoking status: Former    Types: Cigarettes    Quit date: 04/01/1976    Years since quitting: 46.0   Smokeless tobacco: Never  Vaping Use   Vaping Use: Never used  Substance Use Topics   Alcohol use: Yes    Alcohol/week: 2.0 standard drinks of alcohol    Types: 2 Glasses of wine per week    Comment: social   Drug use: No   Current Outpatient Medications  Medication Sig Dispense Refill   acetaminophen (TYLENOL) 500 MG tablet Take 500-1,000 mg by mouth every 8 (eight) hours as needed for mild pain or headache.      apixaban (ELIQUIS) 5 MG TABS tablet Take 1 tablet (5 mg total) by mouth 2 (two) times daily. 60 tablet 5   Ascorbic Acid (VITAMIN C) 1000 MG tablet Take 1,000 mg by mouth daily with breakfast.      augmented betamethasone dipropionate (DIPROLENE-AF) 0.05 % cream Apply 1 application topically 2 (two) times daily as needed (Grover's  disease).      brimonidine (ALPHAGAN) 0.2 % ophthalmic solution Place 1 drop into the left eye in the morning and at bedtime. Instill 1 drop into the left eye twice a day- 6:45 AM and 5:50 PM     calcium carbonate (CALCIUM 600) 600 MG TABS tablet Take 1 tablet (600 mg total) by mouth daily. 30 tablet    Cholecalciferol (VITAMIN D3) 50 MCG (2000 UT) TABS Take 2,000 Units by mouth daily with breakfast.     cyanocobalamin (VITAMIN B12) 1000 MCG/ML injection INJECT 1ML INTO THE MUSCLE ONCE MONTHLY 10 mL 0   dorzolamide-timolol (COSOPT) 22.3-6.8 MG/ML ophthalmic solution Place 1 drop into the left eye 2 (two) times daily.     latanoprost (XALATAN) 0.005 % ophthalmic solution Place 1 drop into the left eye at bedtime.     loperamide (IMODIUM A-D) 2 MG tablet Take 2 mg by mouth daily as needed for diarrhea or loose stools.     metoprolol succinate (TOPROL-XL) 25 MG 24 hr tablet Take 0.5 tablets (12.5 mg total) by mouth daily. 45 tablet 3   Netarsudil Dimesylate 0.02 % SOLN Place 1 drop into the left eye at bedtime.     omeprazole (PRILOSEC OTC) 20 MG tablet Take 20 mg by mouth daily before breakfast.     SYRINGE-NEEDLE, DISP, 3 ML (B-D 3CC LUER-LOK SYR 25GX1") 25G X 1" 3 ML MISC use monthly for b-12 injections 12 each 0   tamsulosin (FLOMAX) 0.4 MG CAPS capsule Take 0.4 mg by mouth at bedtime.     No current facility-administered medications for this visit.   No Known Allergies   Review of Systems: All systems reviewed and negative except where noted in HPI.   Lab Results  Component Value Date   WBC 5.4 11/01/2021   HGB 11.4 (L) 11/01/2021   HCT 34.2 (L) 11/01/2021   MCV 89.7 11/01/2021   PLT 200.0 11/01/2021    Lab Results  Component Value Date   CREATININE 0.97 10/29/2021   BUN 11 10/29/2021   NA 136 10/29/2021   K 3.7 10/29/2021   CL 104 10/29/2021   CO2 23 10/29/2021    .latlft   Physical Exam: BP  126/70   Pulse (!) 56   Ht '5\' 11"'$  (1.803 m)   Wt 171 lb 3.2 oz (77.7 kg)    BMI 23.88 kg/m  Constitutional: Pleasant,well-developed, male in no acute distress. Abdominal: Soft, nondistended, nontender.  Extremities: no edema Neurological: Alert and oriented to person place and time. Psychiatric: Normal mood and affect. Behavior is normal.   ASSESSMENT: 89 y.o. male here for assessment of the following  1. Crohn's disease of small intestine with complication ([place])   2. History of colon polyps   3. Anticoagulated    As above, longstanding Crohn's disease, had never been on biologic therapy.  His course has been complicated by numerous large colon polyps which we have debulked the best we can although due to limited prep and the fact that some of these have been at his surgical anastomosis, these have been rather challenging exams.  We have discussed this on numerous occasions and given his comorbidities and age, history of TIAs etc., we have elected to forego further colonoscopy surveillance which she is still in agreement with.  His last exam was a few years ago without any high risk lesions.  For his Crohn's disease he has a baseline not been on any biologic therapy.  He has used budesonide for short courses as needed for recurrent symptoms, has been out of the hospital without any obstructive problems for the past few years which has been good.  Most recently he had resumed budesonide for some milder symptoms in the past week which have since resolved with budesonide.  He will continue this for another few weeks and then stop.  If he needs to continue to use this he will need to contact me.  Otherwise stay on low residual diet for now.  He had a difficult October, was in the hospital with COVID and encephalopathy which took some time to recover from.  His wife has been dealing with her own health problems and recently had a major surgery.  We are foregoing invasive interventions with him in light of his age, comorbidities.  He will continue to follow-up with me every  6 months or so if not sooner with issues  PLAN: - continue budesonide for 4 week course then stop, call if issues in the interim - labs per PCP, continue vitamin supplements - no further colonoscopy surveillance - discussed risks /benefits as outlined extensively above  Jolly Mango, MD [facility]

## 2022-03-30 DIAGNOSIS — H401133 Primary open-angle glaucoma, bilateral, severe stage: Secondary | ICD-10-CM | POA: Diagnosis not present

## 2022-04-05 ENCOUNTER — Encounter (INDEPENDENT_AMBULATORY_CARE_PROVIDER_SITE_OTHER): Payer: Medicare Other | Admitting: Ophthalmology

## 2022-04-12 ENCOUNTER — Telehealth: Payer: Self-pay | Admitting: Internal Medicine

## 2022-04-12 NOTE — Telephone Encounter (Signed)
Spoke with Ivin Booty today (daughter). Patient was down in [place] and able to do teledoc visit. Augmentin sent in.  Advised that if patient not better by Sunday when they return to contact us for in office appointment.

## 2022-04-12 NOTE — Telephone Encounter (Signed)
PT and daughter call today regarding on set of symptoms. PT is currently experiencing some tenderness/sensitivity in the lower left side of their face (around jaw to the behind the ear). They note of it possibly being an abscess as PT has dealt with these in the past. They do not complain of any discoloration on the outside of the face.  I had suggested them to set up a virtual visit with Dr.Burns but they stated they wanted to talk with Dr.Burns and staff first before setting up a virtual.  [place] which is why the virtual was suggested, but I had informed them I wasn't sure if this matter could even be handled virtually.   CB: [phone]

## 2022-04-17 NOTE — Progress Notes (Unsigned)
    Subjective:    Patient ID: Michael [name], male    DOB: February 23, 1932, 89 y.o.   MRN: [id number]      HPI Michael [name] is here for No chief complaint on file.    Skin issues on face -     Medications and allergies reviewed with patient and updated if appropriate.  Current Outpatient Medications on File Prior to Visit  Medication Sig Dispense Refill   acetaminophen (TYLENOL) 500 MG tablet Take 500-1,000 mg by mouth every 8 (eight) hours as needed for mild pain or headache.      apixaban (ELIQUIS) 5 MG TABS tablet Take 1 tablet (5 mg total) by mouth 2 (two) times daily. 60 tablet 5   Ascorbic Acid (VITAMIN C) 1000 MG tablet Take 1,000 mg by mouth daily with breakfast.      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      brimonidine (ALPHAGAN) 0.2 % ophthalmic solution Place 1 drop into the left eye in the morning and at bedtime. Instill 1 drop into the left eye twice a day- 6:45 AM and 5:50 PM     calcium carbonate (CALCIUM 600) 600 MG TABS tablet Take 1 tablet (600 mg total) by mouth daily. 30 tablet    Cholecalciferol (VITAMIN D3) 50 MCG (2000 UT) TABS Take 2,000 Units by mouth daily with breakfast.     cyanocobalamin (VITAMIN B12) 1000 MCG/ML injection INJECT INTO THE MUSCLE ONCE MONTHLY 10 mL 0   dorzolamide-timolol (COSOPT) 22.3-6.8 MG/ML ophthalmic solution Place 1 drop into the left eye 2 (two) times daily.     latanoprost (XALATAN) 0.005 % ophthalmic solution Place 1 drop into the left eye at bedtime.     loperamide (IMODIUM A-D) 2 MG tablet Take 2 mg by mouth daily as needed for diarrhea or loose stools.     metoprolol succinate (TOPROL-XL) 25 MG 24 hr tablet Take 0.5 tablets (12.5 mg total) by mouth daily. 45 tablet 3   Netarsudil Dimesylate 0.02 % SOLN Place 1 drop into the left eye at bedtime.     omeprazole (PRILOSEC OTC) 20 MG tablet Take 20 mg by mouth daily before  breakfast.     SYRINGE-NEEDLE, DISP, 3 ML (B-D 3CC LUER-LOK SYR 25GX1") 25G X 1" 3 ML MISC use monthly for b-12 injections 12 each 0   tamsulosin (FLOMAX) 0.4 MG CAPS capsule Take 0.4 mg by mouth at bedtime.     No current facility-administered medications on file prior to visit.    Review of Systems     Objective:  There were no vitals filed for this visit. BP Readings from Last 3 Encounters:  03/22/22 126/70  11/15/21 102/68  10/29/21 (!) 140/72   Wt Readings from Last 3 Encounters:  03/22/22 171 lb 3.2 oz (77.7 kg)  11/15/21 166 lb 3.2 oz (75.4 kg)  10/26/21 171 lb 1.2 oz (77.6 kg)   There is no height or weight on file to calculate BMI.    Physical Exam         Assessment & Plan:    See Problem List for Assessment and Plan of chronic medical problems.

## 2022-04-18 ENCOUNTER — Ambulatory Visit (INDEPENDENT_AMBULATORY_CARE_PROVIDER_SITE_OTHER): Payer: Medicare Other | Admitting: Internal Medicine

## 2022-04-18 ENCOUNTER — Encounter: Payer: Self-pay | Admitting: Internal Medicine

## 2022-04-18 VITALS — BP 124/76 | HR 59 | Temp 97.9°F | Ht 71.0 in | Wt 171.0 lb

## 2022-04-18 DIAGNOSIS — H903 Sensorineural hearing loss, bilateral: Secondary | ICD-10-CM | POA: Insufficient documentation

## 2022-04-18 DIAGNOSIS — H7290 Unspecified perforation of tympanic membrane, unspecified ear: Secondary | ICD-10-CM | POA: Insufficient documentation

## 2022-04-18 DIAGNOSIS — I4811 Longstanding persistent atrial fibrillation: Secondary | ICD-10-CM | POA: Insufficient documentation

## 2022-04-18 DIAGNOSIS — K1120 Sialoadenitis, unspecified: Secondary | ICD-10-CM

## 2022-04-18 DIAGNOSIS — I10 Essential (primary) hypertension: Secondary | ICD-10-CM

## 2022-04-18 DIAGNOSIS — Z659 Problem related to unspecified psychosocial circumstances: Secondary | ICD-10-CM | POA: Insufficient documentation

## 2022-04-18 DIAGNOSIS — H9319 Tinnitus, unspecified ear: Secondary | ICD-10-CM | POA: Insufficient documentation

## 2022-04-18 DIAGNOSIS — H9190 Unspecified hearing loss, unspecified ear: Secondary | ICD-10-CM | POA: Insufficient documentation

## 2022-04-18 DIAGNOSIS — K116 Mucocele of salivary gland: Secondary | ICD-10-CM | POA: Diagnosis not present

## 2022-04-18 DIAGNOSIS — Z461 Encounter for fitting and adjustment of hearing aid: Secondary | ICD-10-CM | POA: Insufficient documentation

## 2022-04-18 DIAGNOSIS — R269 Unspecified abnormalities of gait and mobility: Secondary | ICD-10-CM | POA: Insufficient documentation

## 2022-04-18 NOTE — Assessment & Plan Note (Signed)
Chronic Blood pressure well-controlled here today Continue metoprolol XL 12.5 mg daily

## 2022-04-18 NOTE — Patient Instructions (Addendum)
Apply warm compresses to your jaw a few times a day Massage the gland several times a day Continue drinking plenty of water You can suck on sour candy - that helps the gland work more and heal faster     Medications changes include :   complete the antibiotic you are taking    An ultrasound of your parotid gland was ordered.     Someone will call you to schedule an appointment.    Return if symptoms worsen or fail to improve.    Parotitis  Parotitis means that you have irritation and swelling (inflammation) in one or both of your parotid glands. These glands make saliva. They are found on each side of your face, below and in front of your earlobes. You may or may not have pain with this condition. What are the causes? This condition may be caused by: Infections from germs (bacteria or viruses). Something blocking the flow of saliva through the parotid glands. This can be a stone, scar tissue, or a tumor. Diseases that cause your body's defense system (immune system) to attack healthy cells in your salivary glands. These are called autoimmune diseases. What increases the risk? Being 58 years old or older. Not drinking enough fluids (being dehydrated). Drinking too much alcohol. Having: A dry mouth. Diabetes. Gout. A long-term illness. Not taking good care of your mouth and teeth (poor oral hygiene). Having had radiation treatments to the head and neck. Taking certain medicines. What are the signs or symptoms? Swelling under and in front of the ear. This may get worse after you eat. Pain and tenderness over the parotid gland. This may get worse after you eat. Redness and warmth of the skin over the parotid gland. Fever or chills. Pus coming from the ducts inside the mouth. Dry mouth. A bad taste in the mouth. How is this treated? Treatment depends on the cause. It may include: Antibiotic medicine for an infection from bacteria. NSAIDs, such as ibuprofen, to  treat pain and swelling. Drinking more fluids. Removing a stone or obstruction. Treating a disease that is causing parotitis. Surgery to drain an infection, remove a growth, or remove the whole gland. Treatment may not be needed if the swelling goes away with home care. Follow these instructions at home: Medicines  Take over-the-counter and prescription medicines only as told by your doctor. If you were prescribed an antibiotic medicine, take it as told by your doctor. Do not stop taking it even if you start to feel better. Managing pain and swelling If told, put heat on the affected area. Do this as often as told by your doctor. Use the heat source that your doctor recommends, such as a moist heat pack or a heating pad. Place a towel between your skin and the heat source. Leave the heat on for 20-30 minutes. Take off the heat if your skin turns bright red. This is very important. If you cannot feel pain, heat, or cold, you have a greater risk of getting burned. Gargle with salt water 3-4 times a day or as needed. To make salt water, dissolve -1 tsp (3-6 g) of salt in 1 cup (237 mL) of warm water. Gently rub your parotid glands as told by your doctor. General instructions  Drink enough fluid to keep your pee (urine) pale yellow. Keep your mouth clean and moist. Suck on sour candy. This may help to: Make your mouth less dry. Make more saliva. Take good care of your mouth:  Brush your teeth at least two times a day. Floss your teeth every day. See your dentist regularly. Do not smoke or use any products that contain nicotine or tobacco. If you need help quitting, ask your doctor. Do not drink alcohol. Keep all follow-up visits. Contact a doctor if: You have a fever or chills. You have new symptoms. Your symptoms get worse. Your symptoms do not get better with treatment. Get help right away if: You have trouble breathing or swallowing. These symptoms may be an emergency. Get help  right away. Call your local emergency services (911 in the [place] Do not wait to see if the symptoms will go away. Do not drive yourself to the hospital. Summary Parotitis means that you have irritation and swelling (inflammation) in one or both of your parotid glands. Symptoms include pain and swelling under and in front of the ear. Treatment for parotitis depends on the cause. In some cases, the condition may go away on its own with home care. You should drink plenty of fluids, take good care of your mouth, and do not use products that contain nicotine or tobacco. This information is not intended to replace advice given to you by your health care provider. Make sure you discuss any questions you have with your health care provider. Document Revised: 05/08/2020 Document Reviewed: 05/08/2020 Elsevier Patient Education  2023 [employer].

## 2022-04-18 NOTE — Assessment & Plan Note (Signed)
Acute Left side Started last week while in [place] He was able to do a telephone visit and was started on Augmentin 500-125 mg 3 times daily x 10 days Symptoms have improved Likely related to stone given acute onset and improvement with 1 week of antibiotics Symptoms not completely resolved so we will go ahead and order an ultrasound Complete antibiotics Start warm compresses, massaging gland, continue increased water intake, can suck on sour candy Advised to let me know if his symptoms do not continue to improve and resolve

## 2022-05-03 ENCOUNTER — Ambulatory Visit
Admission: RE | Admit: 2022-05-03 | Discharge: 2022-05-03 | Disposition: A | Discharge status: Home / Self Care | Payer: Medicare Other | Source: Ambulatory Visit | Attending: Internal Medicine | Admitting: Internal Medicine

## 2022-05-03 DIAGNOSIS — R590 Localized enlarged lymph nodes: Secondary | ICD-10-CM | POA: Diagnosis not present

## 2022-05-03 DIAGNOSIS — K1120 Sialoadenitis, unspecified: Secondary | ICD-10-CM

## 2022-05-04 ENCOUNTER — Encounter: Payer: Self-pay | Admitting: Internal Medicine

## 2022-05-04 NOTE — Addendum Note (Signed)
Addended by: Pincus Sanes on: 05/04/2022 07:19 AM   Modules accepted: Orders

## 2022-05-10 DIAGNOSIS — R3915 Urgency of urination: Secondary | ICD-10-CM | POA: Diagnosis not present

## 2022-05-11 ENCOUNTER — Ambulatory Visit (INDEPENDENT_AMBULATORY_CARE_PROVIDER_SITE_OTHER): Payer: Medicare Other | Admitting: Internal Medicine

## 2022-05-11 ENCOUNTER — Other Ambulatory Visit: Payer: Self-pay

## 2022-05-11 ENCOUNTER — Encounter: Payer: Self-pay | Admitting: Internal Medicine

## 2022-05-11 VITALS — BP 122/82 | HR 85 | Temp 98.4°F | Ht 71.0 in | Wt 169.0 lb

## 2022-05-11 DIAGNOSIS — I480 Paroxysmal atrial fibrillation: Secondary | ICD-10-CM

## 2022-05-11 DIAGNOSIS — R350 Frequency of micturition: Secondary | ICD-10-CM

## 2022-05-11 DIAGNOSIS — R410 Disorientation, unspecified: Secondary | ICD-10-CM | POA: Diagnosis not present

## 2022-05-11 DIAGNOSIS — I10 Essential (primary) hypertension: Secondary | ICD-10-CM | POA: Diagnosis not present

## 2022-05-11 DIAGNOSIS — R300 Dysuria: Secondary | ICD-10-CM | POA: Diagnosis not present

## 2022-05-11 DIAGNOSIS — E538 Deficiency of other specified B group vitamins: Secondary | ICD-10-CM

## 2022-05-11 DIAGNOSIS — Z8673 Personal history of transient ischemic attack (TIA), and cerebral infarction without residual deficits: Secondary | ICD-10-CM | POA: Diagnosis not present

## 2022-05-11 LAB — POC URINALSYSI DIPSTICK (AUTOMATED)
Bilirubin, UA: NEGATIVE
Blood, UA: NEGATIVE
Glucose, UA: NEGATIVE
Ketones, UA: NEGATIVE
Leukocytes, UA: NEGATIVE
Nitrite, UA: NEGATIVE
Protein, UA: NEGATIVE
Spec Grav, UA: <=1.005 — AB (ref 1.010–1.025)
Urobilinogen, UA: 0.2 E.U./dL
pH, UA: 6 (ref 5.0–8.0)

## 2022-05-11 NOTE — Assessment & Plan Note (Signed)
Chronic ?Check B12 level ?

## 2022-05-11 NOTE — Addendum Note (Signed)
Addended by: Karma Ganja on: 05/11/2022 04:57 PM   Modules accepted: Orders

## 2022-05-11 NOTE — Assessment & Plan Note (Signed)
Chronic Following with cardiology In sinus rhythm here today He is on Eliquis 5 mg twice daily and metoprolol XL 12.5 mg daily-he is taking his own medication and his wife states that he is very good about taking it ?  Missing a dose here and there-will have daughter try to evaluate that if possible

## 2022-05-11 NOTE — Patient Instructions (Addendum)
      Blood work was ordered.   The lab is on the first floor.    Medications changes include :   none   Monitor BP at home    Return if symptoms worsen or fail to improve.

## 2022-05-11 NOTE — Assessment & Plan Note (Signed)
History of TIA Had recent episode of confusion-yesterday that was transient ?  New TIA Taking Eliquis 5 mg twice daily-continue Monitor blood pressure to make sure it is well-controlled LDL has been at goal without statin needs

## 2022-05-11 NOTE — Progress Notes (Signed)
Subjective:    Patient ID: Michael [name], male    DOB: 1932-10-12, 89 y.o.   MRN: [id number]      HPI Michael [name] is here for  Chief Complaint  Patient presents with   Transient Ischemic Attack    TIA ; fall on Monday   He is here with his wife and his daughter who is a nurse is on the phone.  He fell two days ago.  He fell at school.  He missed a step on the bleacher.  No major injuries-has a few superficial wounds.  Per his daughter the wounds are clean - daughter wrapped them this morning.     Yesterday saw urology for his overactive bladder.  For the past year or so he has had frequent urination-urinates every hour.  The urologist wanted him to do a trial of Gemtesa which she has not started yet-wanted to make sure it was okay to try.  At urology - BP elevated 179/?    Driving was not as great when they left.  They went when to eat after the appointment and did some other things and when he was going home his wife states his parking in the garage [place] is good-card and get in as smoothly, but she did not elaborate.  When they were home he went to turn on the TV and was not able to do that.  This is something that he does on a daily basis and has never had any difficulty with.  His wife tested him and he was able to smile, his races hands and there was no focal neurological deficit.  His temperature was normal and his blood pressure was high.  He started to be less confused.     At 8pm 153/80's  - was mentally/cognitively normal.  They did call on-call last night and discussed taking an extra half dose of metoprolol-I was the on-call doctor and said that was okay.   BP today normal.     Takes his own meds.  Wife and daughter state minimal memory issues-once in a while difficulty recalling, but otherwise does everything on his own and takes his own medications and is very sharp for his age.  He is never had difficulty with turning on the TV or operating anything around the  house.    Medications and allergies reviewed with patient and updated if appropriate.  Current Outpatient Medications on File Prior to Visit  Medication Sig Dispense Refill   acetaminophen (TYLENOL) 500 MG tablet Take 500-1,000 mg by mouth every 8 (eight) hours as needed for mild pain or headache.      apixaban (ELIQUIS) 5 MG TABS tablet Take 1 tablet (5 mg total) by mouth 2 (two) times daily. 60 tablet 5   Ascorbic Acid (VITAMIN C) 1000 MG tablet Take 1,000 mg by mouth daily with breakfast.      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      brimonidine (ALPHAGAN) 0.2 % ophthalmic solution Place 1 drop into the left eye in the morning and at bedtime. Instill 1 drop into the left eye twice a day- 6:45 AM and 5:50 PM     calcium carbonate (CALCIUM 600) 600 MG TABS tablet Take 1 tablet (600 mg total) by mouth daily. 30 tablet    Cholecalciferol (VITAMIN D3) 50 MCG (2000 UT) TABS Take 2,000 Units by mouth daily with breakfast.  cyanocobalamin (VITAMIN B12) 1000 MCG/ML injection INJECT INTO THE MUSCLE ONCE MONTHLY 10 mL 0   desmopressin (DDAVP) 0.1 MG tablet Take 100 mcg by mouth daily.     dorzolamide-timolol (COSOPT) 22.3-6.8 MG/ML ophthalmic solution Place 1 drop into the left eye 2 (two) times daily.     latanoprost (XALATAN) 0.005 % ophthalmic solution Place 1 drop into the left eye at bedtime.     loperamide (IMODIUM A-D) 2 MG tablet Take 2 mg by mouth daily as needed for diarrhea or loose stools.     metoprolol succinate (TOPROL-XL) 25 MG 24 hr tablet Take 0.5 tablets (12.5 mg total) by mouth daily. 45 tablet 3   Netarsudil Dimesylate 0.02 % SOLN Place 1 drop into the left eye at bedtime.     omeprazole (PRILOSEC OTC) 20 MG tablet Take 20 mg by mouth daily before breakfast.     SYRINGE-NEEDLE, DISP, 3 ML (B-D 3CC LUER-LOK SYR 25GX1") 25G X 1" 3 ML MISC use monthly for b-12  injections 12 each 0   tamsulosin (FLOMAX) 0.4 MG CAPS capsule Take 0.4 mg by mouth at bedtime.     No current facility-administered medications on file prior to visit.    Review of Systems  Constitutional:  Negative for fever.  Eyes:  Negative for visual disturbance.  Respiratory:  Negative for cough, shortness of breath and wheezing.   Cardiovascular:  Positive for palpitations (maybe once in a while). Negative for chest pain and leg swelling.  Neurological:  Negative for dizziness, light-headedness and headaches.       Objective:   Vitals:   05/11/22 1534  BP: 122/82  Pulse: 85  Temp: 98.4 F (36.9 C)  SpO2: 96%   BP Readings from Last 3 Encounters:  05/11/22 122/82  04/18/22 124/76  03/22/22 126/70   Wt Readings from Last 3 Encounters:  05/11/22 169 lb (76.7 kg)  04/18/22 171 lb (77.6 kg)  03/22/22 171 lb 3.2 oz (77.7 kg)   Body mass index is 23.57 kg/m.    Physical Exam Constitutional:      General: He is not in acute distress.    Appearance: Normal appearance. He is not ill-appearing.  HENT:     Head: Normocephalic and atraumatic.  Eyes:     Conjunctiva/sclera: Conjunctivae normal.  Cardiovascular:     Rate and Rhythm: Normal rate and regular rhythm.     Heart sounds: Normal heart sounds.  Pulmonary:     Effort: Pulmonary effort is normal. No respiratory distress.     Breath sounds: Normal breath sounds. No wheezing or rales.  Musculoskeletal:     Right lower leg: No edema.     Left lower leg: No edema.  Skin:    General: Skin is warm and dry.     Findings: No rash.  Neurological:     Mental Status: He is alert. Mental status is at baseline.     Sensory: No sensory deficit.     Motor: No weakness.     Gait: Gait abnormal (very unbalanced).  Psychiatric:        Mood and Affect: Mood normal.            Assessment & Plan:    See Problem List for Assessment and Plan of chronic medical problems.

## 2022-05-11 NOTE — Assessment & Plan Note (Signed)
Acute episode of confusion yesterday-difficult to know how long it lasted, but likely a couple-few hours.  Maybe less No focal neurological deficit when his family checked him He had difficulty operating/turning on the TV Yesterday evening he was back to his normal self and is acting normal today Blood pressure was elevated yesterday-I okayed an extra half dose of metoprolol last night Blood pressure well-controlled today  Transient episode-?  TIA-does have A-fib and states he is taking the Eliquis.  I have asked his daughter to confirm that he is taking this twice daily every day Last fall had imaging does have a history of remote strokes-will hold off on imaging since he had last fall and there is unlikely to be any change ?  Hypertension encephalopathy-they will start monitoring his blood pressure more regularly to see if blood pressure is causing of this or reaction to this Confusion from underlying dementia.  Neurology is concerning his mild cognitive impairment-has never been officially tested-refused.  Family feels like he is fully independent in ADLs and has not seen any confusion or major difficulty with memory Check UA, urine culture, check basic blood work They will monitor blood pressure Update me with symptoms and we can see if we need to do anything further at this point

## 2022-05-11 NOTE — Assessment & Plan Note (Signed)
Chronic Blood pressure very well-controlled here today, but yesterday it was very elevated Family will start monitoring regularly to make sure it is controlled Continue metoprolol XL 12.5 mg daily

## 2022-05-12 DIAGNOSIS — H5319 Other subjective visual disturbances: Secondary | ICD-10-CM | POA: Diagnosis not present

## 2022-05-12 LAB — CBC WITH DIFFERENTIAL/PLATELET
Basophils Absolute: 0.1 10*3/uL (ref 0.0–0.1)
Basophils Relative: 1.5 % (ref 0.0–3.0)
Eosinophils Absolute: 0.1 10*3/uL (ref 0.0–0.7)
Eosinophils Relative: 1 % (ref 0.0–5.0)
HCT: 36.6 % — ABNORMAL LOW (ref 39.0–52.0)
Hemoglobin: 12.6 g/dL — ABNORMAL LOW (ref 13.0–17.0)
Lymphocytes Relative: 26.4 % (ref 12.0–46.0)
Lymphs Abs: 2.5 10*3/uL (ref 0.7–4.0)
MCHC: 34.3 g/dL (ref 30.0–36.0)
MCV: 90.5 fl (ref 78.0–100.0)
Monocytes Absolute: 0.8 10*3/uL (ref 0.1–1.0)
Monocytes Relative: 9 % (ref 3.0–12.0)
Neutro Abs: 5.8 10*3/uL (ref 1.4–7.7)
Neutrophils Relative %: 62.1 % (ref 43.0–77.0)
Platelets: 220 10*3/uL (ref 150.0–400.0)
RBC: 4.05 Mil/uL — ABNORMAL LOW (ref 4.22–5.81)
RDW: 18.6 % — ABNORMAL HIGH (ref 11.5–15.5)
WBC: 9.3 10*3/uL (ref 4.0–10.5)

## 2022-05-12 LAB — COMPREHENSIVE METABOLIC PANEL
ALT: 9 U/L (ref 0–53)
AST: 15 U/L (ref 0–37)
Albumin: 4 g/dL (ref 3.5–5.2)
Alkaline Phosphatase: 48 U/L (ref 39–117)
BUN: 16 mg/dL (ref 6–23)
CO2: 29 mEq/L (ref 19–32)
Calcium: 9.1 mg/dL (ref 8.4–10.5)
Chloride: 100 mEq/L (ref 96–112)
Creatinine, Ser: 0.91 mg/dL (ref 0.40–1.50)
GFR: 75.35 mL/min (ref >60.00)
Glucose, Bld: 84 mg/dL (ref 70–99)
Potassium: 4 mEq/L (ref 3.5–5.1)
Sodium: 136 mEq/L (ref 135–145)
Total Bilirubin: 1 mg/dL (ref 0.2–1.2)
Total Protein: 7.5 g/dL (ref 6.0–8.3)

## 2022-05-12 LAB — TSH: TSH: 2.06 u[IU]/mL (ref 0.35–5.50)

## 2022-05-12 LAB — VITAMIN B12: Vitamin B-12: 234 pg/mL (ref 211–911)

## 2022-05-14 LAB — CULTURE, URINE COMPREHENSIVE

## 2022-05-16 ENCOUNTER — Telehealth: Payer: Self-pay | Admitting: Plastic Surgery

## 2022-05-16 NOTE — Telephone Encounter (Signed)
Pts daughter is calling in to see if we can get him in to see Dr Ulice Bold for wounds that are not healing on L wrist, L forearm and bilateral knees. Daughter would like to come here instead of going to UC and hasn't been seen since 2022.  She would like to be called with advise about an appointment.

## 2022-05-17 ENCOUNTER — Ambulatory Visit (INDEPENDENT_AMBULATORY_CARE_PROVIDER_SITE_OTHER): Payer: Medicare Other | Admitting: Plastic Surgery

## 2022-05-17 ENCOUNTER — Encounter: Payer: Self-pay | Admitting: Plastic Surgery

## 2022-05-17 ENCOUNTER — Telehealth: Payer: Self-pay

## 2022-05-17 VITALS — BP 133/81 | HR 88 | Ht 71.0 in | Wt 168.8 lb

## 2022-05-17 DIAGNOSIS — W010XXA Fall on same level from slipping, tripping and stumbling without subsequent striking against object, initial encounter: Secondary | ICD-10-CM | POA: Diagnosis not present

## 2022-05-17 DIAGNOSIS — S50312A Abrasion of left elbow, initial encounter: Secondary | ICD-10-CM | POA: Diagnosis not present

## 2022-05-17 DIAGNOSIS — S60812A Abrasion of left wrist, initial encounter: Secondary | ICD-10-CM

## 2022-05-17 DIAGNOSIS — S80211A Abrasion, right knee, initial encounter: Secondary | ICD-10-CM

## 2022-05-17 DIAGNOSIS — S80212A Abrasion, left knee, initial encounter: Secondary | ICD-10-CM | POA: Diagnosis not present

## 2022-05-17 DIAGNOSIS — T148XXA Other injury of unspecified body region, initial encounter: Secondary | ICD-10-CM | POA: Insufficient documentation

## 2022-05-17 NOTE — Telephone Encounter (Signed)
Emailed rep with [facility] to see if they accept [facility]. At one point, they did not.  Also, I have left a message for Claud Kelp, RN at the [facility] to regarding wound care supplies/HH.

## 2022-05-17 NOTE — Progress Notes (Signed)
   Subjective:    Patient ID: Michael [name], male    DOB: 1932-05-05, 89 y.o.   MRN: [id number]  The patient is an 89 year old male here for evaluation of several wounds.  He was outside walking when he tripped and sustained multiple injuries.  He has an abrasion to both knees, left wrist and left elbow.  They are approximately 2 cm in size.  Nothing looks infected.  The left knee is about 2-1/2 cm and is a little deeper than the other ones.  Still does not look infected.  All areas are red and bruised with some swelling and bruising.      Review of Systems  Constitutional: Negative.   HENT: Negative.    Eyes: Negative.   Respiratory: Negative.    Cardiovascular: Negative.   Gastrointestinal: Negative.   Endocrine: Negative.   Genitourinary: Negative.   Musculoskeletal: Negative.        Objective:   Physical Exam Vitals and nursing note reviewed.  Constitutional:      Appearance: Normal appearance.  HENT:     Head: Atraumatic.  Cardiovascular:     Rate and Rhythm: Normal rate.     Pulses: Normal pulses.  Pulmonary:     Effort: Pulmonary effort is normal.  Musculoskeletal:        General: Swelling and tenderness present.  Skin:    General: Skin is warm.     Capillary Refill: Capillary refill takes less than 2 seconds.     Findings: Bruising and erythema present.  Neurological:     Mental Status: He is alert and oriented to person, place, and time.  Psychiatric:        Mood and Affect: Mood normal.        Behavior: Behavior normal.        Thought Content: Thought content normal.        Judgment: Judgment normal.       Assessment & Plan:     ICD-10-CM   1. Multiple wounds of skin  T14.8XXA       Antibiotic ointment or Vaseline to the right knee, left wrist and left elbow.  Clean with Vashe daily. Cover with sterile dressing.  The left knee I applied donated myriad powder and sheet.  Apply K-Y jelly with Adaptic daily.  Pictures were obtained of the patient and  placed in the chart with the patient's or guardian's permission.  Follow-up in 10 days.

## 2022-05-23 ENCOUNTER — Telehealth: Payer: Self-pay

## 2022-05-23 ENCOUNTER — Other Ambulatory Visit: Payer: Self-pay

## 2022-05-23 ENCOUNTER — Telehealth: Payer: Self-pay | Admitting: Physician Assistant

## 2022-05-23 DIAGNOSIS — T148XXA Other injury of unspecified body region, initial encounter: Secondary | ICD-10-CM

## 2022-05-23 NOTE — Telephone Encounter (Signed)
Faxed [facility] for wound care and hopefully transportation. I contacted pt's insurance and they didn't have anything for transportation.  I faxed the [facility] sheet, demo, referral, insurance info, and OV note with confirmed receipt. I will scan to chart.

## 2022-05-23 NOTE — Telephone Encounter (Signed)
Daughter called and wants to know when they will receive supplies for his arm, I spoke with [facility] and she stated that [facility] was not able to send supplies. Daughter wants to know what they are supposed to do and if they are supposed to speak with the primary care provider for supplies, what do they need to order she stated.

## 2022-05-24 NOTE — Telephone Encounter (Signed)
Patients daughter called to follow up on this. She was hoping to get a response back as soon as possible. She asked for a call back at [phone]

## 2022-05-24 NOTE — Telephone Encounter (Signed)
Spoke to [place] with [facility].. she will contact the [facility] for prior authorization for [facility]. She will also reach out to [facility] to see if they can help with transportation. They will be reaching out to the patient.

## 2022-05-25 ENCOUNTER — Telehealth: Payer: Self-pay

## 2022-05-25 NOTE — Telephone Encounter (Signed)
Hi MK, can you find out what they actually need? I have not met this patient but I just asked Dr. Ulice Bold and he really only needed Adaptic for the one spot where she applied the Myriad.  Evidently he was getting home health set up and [facility] won't send him supplies if that is in place. For his other wounds she said that he can simply wash with Vashe and apply Vaseline. Thank you

## 2022-05-25 NOTE — Telephone Encounter (Signed)
Faxing [facility] with wound care supply request. [facility] is checking with the [facility], however, the daughter Jasmine December) stated he was able to get [facility] once before with the same insurance.   I will scan and place in the chart.

## 2022-05-26 DIAGNOSIS — T148XXA Other injury of unspecified body region, initial encounter: Secondary | ICD-10-CM | POA: Diagnosis not present

## 2022-05-26 NOTE — Progress Notes (Signed)
Referring Provider Pincus Sanes, MD [address] [place],  [place] [address]   CC:  Chief Complaint  Patient presents with   Follow-up      Michael [name] is an 89 y.o. male.  HPI: Patient is an 89 year old male who presents to clinic last week after sustaining abrasions to multiple extremities after a mechanical fall.    Reviewed chart and he was seen by Dr. Ulice Bold 05/17/2022 and the abrasions all.  Clean, no larger than 2.5 cm.  No evidence concerning for infection.  Some surrounding swelling and ecchymoses.  Recommended Vaseline to his right knee, left wrist, and left elbow.  As for the 2.5 cm wound on the left knee, donated myriad powder was applied.  Recommended Adaptic and K-Y jelly dressing changes daily.  Unfortunately, he has had difficulty obtaining the necessary wound care supplies.  Today, patient presents to clinic accompanied by his son-in-law.  They tell me that he sustained these injuries from mechanical fall while watching his granddaughter play soccer.  They have been applying bordered Mepilex dressings to the wounds which she states he likes very much as they are soft on his skin.  They have been applying K-Y jelly, as directed.  They inquire about ongoing dressing changes.  Patient reports that the pain has improved considerably since myriad was placed at last visit.   No Known Allergies  Outpatient Encounter Medications as of 05/27/2022  Medication Sig   acetaminophen (TYLENOL) 500 MG tablet Take 500-1,000 mg by mouth every 8 (eight) hours as needed for mild pain or headache.    apixaban (ELIQUIS) 5 MG TABS tablet Take 1 tablet (5 mg total) by mouth 2 (two) times daily.   Ascorbic Acid (VITAMIN C) 1000 MG tablet Take 1,000 mg by mouth daily with breakfast.    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    brimonidine (ALPHAGAN) 0.2 % ophthalmic solution Place  1 drop into the left eye in the morning and at bedtime. Instill 1 drop into the left eye twice a day- 6:45 AM and 5:50 PM   calcium carbonate (CALCIUM 600) 600 MG TABS tablet Take 1 tablet (600 mg total) by mouth daily.   Cholecalciferol (VITAMIN D3) 50 MCG (2000 UT) TABS Take 2,000 Units by mouth daily with breakfast.   cyanocobalamin (VITAMIN B12) 1000 MCG/ML injection INJECT INTO THE MUSCLE ONCE MONTHLY   desmopressin (DDAVP) 0.1 MG tablet Take 100 mcg by mouth daily.   dorzolamide-timolol (COSOPT) 22.3-6.8 MG/ML ophthalmic solution Place 1 drop into the left eye 2 (two) times daily.   latanoprost (XALATAN) 0.005 % ophthalmic solution Place 1 drop into the left eye at bedtime.   loperamide (IMODIUM A-D) 2 MG tablet Take 2 mg by mouth daily as needed for diarrhea or loose stools.   metoprolol succinate (TOPROL-XL) 25 MG 24 hr tablet Take 0.5 tablets (12.5 mg total) by mouth daily.   Netarsudil Dimesylate 0.02 % SOLN Place 1 drop into the left eye at bedtime.   omeprazole (PRILOSEC OTC) 20 MG tablet Take 20 mg by mouth daily before breakfast.   SYRINGE-NEEDLE, DISP, 3 ML (B-D 3CC LUER-LOK SYR 25GX1") 25G X 1" 3 ML MISC use monthly for b-12 injections   tamsulosin (FLOMAX) 0.4 MG CAPS capsule Take 0.4 mg by mouth at bedtime.   No facility-administered encounter medications on file as of 05/27/2022.     Past Medical History:  Diagnosis  Date   Adrenal insufficiency (HCC)    Anemia    Atrial fibrillation (HCC)    Avascular necrosis (HCC)    Clavicle fracture 11/02/2017   Colon polyp    Colon polyps    Crohn's disease (HCC)    Glaucoma    Inguinal hernia recurrent bilateral    Low testosterone    Osteopenia    Retinal ischemia     Past Surgical History:  Procedure Laterality Date   ABDOMINAL SURGERY     BIOPSY  03/19/2020   Procedure: BIOPSY;  Surgeon: Benancio Deeds, MD;  Location: [facility];  Service: Gastroenterology;;   COLONOSCOPY N/A 12/13/2015   Procedure:  COLONOSCOPY;  Surgeon: Carman Ching, MD;  Location: [facility];  Service: Endoscopy;  Laterality: N/A;   COLONOSCOPY WITH PROPOFOL N/A 03/25/2019   Procedure: COLONOSCOPY WITH PROPOFOL;  Surgeon: Benancio Deeds, MD;  Location: [facility];  Service: Gastroenterology;  Laterality: N/A;   COLONOSCOPY WITH PROPOFOL N/A 08/26/2019   Procedure: COLONOSCOPY WITH PROPOFOL;  Surgeon: Benancio Deeds, MD;  Location: [facility];  Service: Gastroenterology;  Laterality: N/A;   COLONOSCOPY WITH PROPOFOL N/A 03/19/2020   Procedure: COLONOSCOPY WITH PROPOFOL;  Surgeon: Benancio Deeds, MD;  Location: [facility];  Service: Gastroenterology;  Laterality: N/A;   ENDOSCOPIC MUCOSAL RESECTION N/A 08/26/2019   Procedure: ENDOSCOPIC MUCOSAL RESECTION;  Surgeon: Benancio Deeds, MD;  Location: [facility];  Service: Gastroenterology;  Laterality: N/A;   INGUINAL HERNIA REPAIR Bilateral 05/21/2014   Procedure: OPEN BILATERAL INGUINAL HERNIA REPAIRS WITH MESH;  Surgeon: Manus Rudd, MD;  Location: [facility];  Service: General;  Laterality: Bilateral;   POLYPECTOMY  03/25/2019   Procedure: POLYPECTOMY;  Surgeon: Benancio Deeds, MD;  Location: [facility];  Service: Gastroenterology;;   POLYPECTOMY  08/26/2019   Procedure: POLYPECTOMY;  Surgeon: Benancio Deeds, MD;  Location: [facility];  Service: Gastroenterology;;   POLYPECTOMY  03/19/2020   Procedure: POLYPECTOMY;  Surgeon: Benancio Deeds, MD;  Location: [facility];  Service: Gastroenterology;;   Theresia Majors  08/26/2019   Procedure: Sprayed methylene blue;  Surgeon: Benancio Deeds, MD;  Location: [facility];  Service: Gastroenterology;;   SMALL INTESTINE SURGERY  1968, 2001    related to Chrohn's disease   TONSILLECTOMY      Family History  Problem Relation Age of Onset   Deep vein thrombosis Mother    Transient ischemic attack Mother    Heart disease Father    Cancer Maternal Aunt         unknown type; dx after 47   Cancer Cousin        maternal cousin; unknown type   Cancer Cousin        paternal cousin; unknown type; dx after 50   Stomach cancer Neg Hx    Colon cancer Neg Hx    Esophageal cancer Neg Hx    Pancreatic cancer Neg Hx    Rectal cancer Neg Hx    Colon polyps Neg Hx     Social History   Social History Narrative   Not on file     Review of Systems General: Denies fevers or chills Skin: Denies any bleeding, drainage, or significant pain  Physical Exam    05/17/2022   10:47 AM 05/11/2022    3:34 PM 04/18/2022    8:58 AM  Vitals with BMI  Height 5\' 11"  5\' 11"  5\' 11"   Weight 168 lbs 13 oz 169 lbs 171 lbs  BMI 23.55 23.58  23.86  Systolic 133 122 161  Diastolic 81 82 76  Pulse 88 85 59    General:  No acute distress, nontoxic appearing  Respiratory: No increased work of breathing Neuro: Alert and oriented Psychiatric: Normal mood and affect  Skin: Left elbow has a 2 cm linear wound with approximately 2 mm separation.  Relatively superficial base, no drainage noted.  Lateral aspect of left knee has a 2.25 x 1.5 cm superficial wound with some overlying incorporating myriad.  No surrounding erythema or induration concerning for infection.  No malodor or drainage noted.  Assessment/Plan  Multiple wounds subsequent to mechanical fall: Recommending Vaseline to the linear wound on left elbow as well as the almost completely healed linear wound on the right knee.  The left knee is still a work in progress, however appears improved compared to pictures obtained at subsequent encounter.  Recommending K-Y jelly on the left knee.  Asked that they continue to dress the left elbow and left knee with a bordered Mepilex dressing.  These can be obtained over-the-counter through supplier such as [employer] if [facility] or [facility] cannot provide.  The right knee wound does not require any specific bandages.  See pictures for further clarity.  No evidence is concerning for infection  today.  At this point, suspect that they will continue to heal by secondary intent.  Will arrange for him to follow-up in 2 weeks for likely final postoperative encounter.  Suspect at that time, he will be almost entirely healed.  They can certainly call the clinic that should he have any questions or concerns in interim.  Picture(s) obtained of the patient and placed in the chart were with the patient's or guardian's permission.   Evelena Leyden 05/27/2022, 9:32 AM

## 2022-05-27 ENCOUNTER — Encounter: Payer: Self-pay | Admitting: Physician Assistant

## 2022-05-27 ENCOUNTER — Ambulatory Visit (INDEPENDENT_AMBULATORY_CARE_PROVIDER_SITE_OTHER): Payer: Medicare Other | Admitting: Physician Assistant

## 2022-05-27 VITALS — BP 131/72 | HR 91 | Ht 71.0 in | Wt 166.6 lb

## 2022-05-27 DIAGNOSIS — S80211A Abrasion, right knee, initial encounter: Secondary | ICD-10-CM | POA: Diagnosis not present

## 2022-05-27 DIAGNOSIS — S60812A Abrasion of left wrist, initial encounter: Secondary | ICD-10-CM | POA: Diagnosis not present

## 2022-05-27 DIAGNOSIS — S80212A Abrasion, left knee, initial encounter: Secondary | ICD-10-CM | POA: Diagnosis not present

## 2022-05-27 DIAGNOSIS — S50312A Abrasion of left elbow, initial encounter: Secondary | ICD-10-CM | POA: Diagnosis not present

## 2022-05-27 DIAGNOSIS — T148XXA Other injury of unspecified body region, initial encounter: Secondary | ICD-10-CM

## 2022-05-27 DIAGNOSIS — W010XXA Fall on same level from slipping, tripping and stumbling without subsequent striking against object, initial encounter: Secondary | ICD-10-CM | POA: Diagnosis not present

## 2022-05-30 ENCOUNTER — Telehealth: Payer: Self-pay

## 2022-05-30 NOTE — Telephone Encounter (Signed)
Spoke with [place] with [facility] regarding HH services. She stated the daughter declined HH due to the inability for the nurse to go out every day twice a day.  Also, transportation would be self-pay, so daughter declined.

## 2022-06-02 ENCOUNTER — Ambulatory Visit (INDEPENDENT_AMBULATORY_CARE_PROVIDER_SITE_OTHER): Payer: Medicare Other | Admitting: Family Medicine

## 2022-06-02 ENCOUNTER — Encounter: Payer: Self-pay | Admitting: Family Medicine

## 2022-06-02 ENCOUNTER — Ambulatory Visit (HOSPITAL_COMMUNITY)
Admission: RE | Admit: 2022-06-02 | Discharge: 2022-06-02 | Disposition: A | Discharge status: Home / Self Care | Payer: Medicare Other | Source: Ambulatory Visit | Attending: Family Medicine | Admitting: Family Medicine

## 2022-06-02 VITALS — BP 102/60 | HR 70 | Temp 97.6°F | Ht 71.0 in | Wt 167.2 lb

## 2022-06-02 DIAGNOSIS — G441 Vascular headache, not elsewhere classified: Secondary | ICD-10-CM

## 2022-06-02 DIAGNOSIS — I6381 Other cerebral infarction due to occlusion or stenosis of small artery: Secondary | ICD-10-CM | POA: Diagnosis not present

## 2022-06-02 DIAGNOSIS — R519 Headache, unspecified: Secondary | ICD-10-CM | POA: Diagnosis not present

## 2022-06-02 NOTE — Progress Notes (Signed)
Acute Office Visit  Subjective:     Patient ID: Michael [name], male    DOB: 25-Aug-1932, 89 y.o.   MRN: [id number]  Chief Complaint  Patient presents with   Headache    Patient complains of a severe headache x1 day, has not tried any medications    Headache    Patient is in today for 1 day history of "throbbing" sensation in his head. States that he usually doesn't have headaches. Located on the right temporo-occipital area. Patient denies any neurologic signs including blurry vision, facial droop, disorientation, no hearing difficulties, no weakness on one side of the body. Daughter is present in visit and she reports he does have a history of TIAs, CVA's in the past, a history of atrial fibrillation and is on blood thinners, no trauma or falls.   Review of Systems  Neurological:  Positive for headaches.  All other systems reviewed and are negative.       Objective:    BP 102/60 (BP Location: Left Arm, Patient Position: Sitting, Cuff Size: Normal)   Pulse 70   Temp 97.6 F (36.4 C) (Oral)   Ht 5\' 11"  (1.803 m)   Wt 167 lb 3.2 oz (75.8 kg)   SpO2 98%   BMI 23.32 kg/m    Physical Exam Vitals reviewed.  Constitutional:      Appearance: Normal appearance. He is well-groomed and normal weight.  Eyes:     General: No visual field deficit.    Extraocular Movements: Extraocular movements intact.     Conjunctiva/sclera: Conjunctivae normal.     Pupils: Pupils are equal, round, and reactive to light.  Neck:     Thyroid: No thyromegaly.  Cardiovascular:     Rate and Rhythm: Normal rate and regular rhythm.     Heart sounds: S1 normal and S2 normal. No murmur heard. Pulmonary:     Effort: Pulmonary effort is normal.     Breath sounds: Normal breath sounds and air entry. No rales.  Abdominal:     General: Abdomen is flat. Bowel sounds are normal.  Musculoskeletal:     Cervical back: Neck supple.     Right lower leg: No edema.     Left lower leg: No edema.   Neurological:     General: No focal deficit present.     Mental Status: He is alert and oriented to person, place, and time. Mental status is at baseline.     Cranial Nerves: No cranial nerve deficit or facial asymmetry.     Sensory: No sensory deficit.     Motor: No weakness.     Gait: Gait is intact. Gait normal.  Psychiatric:        Mood and Affect: Mood and affect normal.     No results found for any visits on 06/02/22.      Assessment & Plan:   Problem List Items Addressed This Visit   None Visit Diagnoses     Other vascular headache    -  Primary   Relevant Orders   CT HEAD WO CONTRAST ( )      Patient is on blood thinners and has atrial fibrillation and HTN, history of old lacunar infarct on previous CT. Given the risk of intracranial pathology I am ordering a STAT CT of the head without contrast to rule out concerning pathology. I instructed family member that if his symptoms worsened or if new symptoms developed she should take him directly to the ER for immediate evaluation.  Neurologic exam is benign at this time.   No orders of the defined types were placed in this encounter.   No follow-ups on file.  Karie Georges, MD

## 2022-06-08 NOTE — Progress Notes (Deleted)
Referring Provider Pincus Sanes, MD [address] [place],  [place] [address]   CC: No chief complaint on file.     Michael [name] is an 89 y.o. male.  HPI: Patient is an 89 year old male who presents to clinic last week after sustaining abrasions to multiple extremities after a mechanical fall.   He was last seen here in clinic on 05/27/2022.  At that time, lateral aspect of left knee had a 2.25 x 1.5 cm superficial wound with some overlying incorporating myriad.  Left elbow wound was 2 cm, linear with approximately 2 mm separation.  Other wounds had largely healed.  Recommending Vaseline to the elbow and continued K-Y jelly to the left knee.  Discussed overlying bordered Mepilex dressings.  Today,  No Known Allergies  Outpatient Encounter Medications as of 06/10/2022  Medication Sig   acetaminophen (TYLENOL) 500 MG tablet Take 500-1,000 mg by mouth every 8 (eight) hours as needed for mild pain or headache.    apixaban (ELIQUIS) 5 MG TABS tablet Take 1 tablet (5 mg total) by mouth 2 (two) times daily.   Ascorbic Acid (VITAMIN C) 1000 MG tablet Take 1,000 mg by mouth daily with breakfast.    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    brimonidine (ALPHAGAN) 0.2 % ophthalmic solution Place 1 drop into the left eye in the morning and at bedtime. Instill 1 drop into the left eye twice a day- 6:45 AM and 5:50 PM   calcium carbonate (CALCIUM 600) 600 MG TABS tablet Take 1 tablet (600 mg total) by mouth daily.   Cholecalciferol (VITAMIN D3) 50 MCG (2000 UT) TABS Take 2,000 Units by mouth daily with breakfast.   cyanocobalamin (VITAMIN B12) 1000 MCG/ML injection INJECT INTO THE MUSCLE ONCE MONTHLY   desmopressin (DDAVP) 0.1 MG tablet Take 100 mcg by mouth daily.   dorzolamide-timolol (COSOPT) 22.3-6.8 MG/ML ophthalmic solution Place 1 drop into the left eye 2 (two) times daily.    latanoprost (XALATAN) 0.005 % ophthalmic solution Place 1 drop into the left eye at bedtime.   loperamide (IMODIUM A-D) 2 MG tablet Take 2 mg by mouth daily as needed for diarrhea or loose stools.   metoprolol succinate (TOPROL-XL) 25 MG 24 hr tablet Take 0.5 tablets (12.5 mg total) by mouth daily.   Netarsudil Dimesylate 0.02 % SOLN Place 1 drop into the left eye at bedtime.   omeprazole (PRILOSEC OTC) 20 MG tablet Take 20 mg by mouth daily before breakfast.   SYRINGE-NEEDLE, DISP, 3 ML (B-D 3CC LUER-LOK SYR 25GX1") 25G X 1" 3 ML MISC use monthly for b-12 injections   tamsulosin (FLOMAX) 0.4 MG CAPS capsule Take 0.4 mg by mouth at bedtime.   No facility-administered encounter medications on file as of 06/10/2022.     Past Medical History:  Diagnosis Date   Adrenal insufficiency (HCC)    Anemia    Atrial fibrillation (HCC)    Avascular necrosis (HCC)    Clavicle fracture 11/02/2017   Colon polyp    Colon polyps    Crohn's disease (HCC)    Glaucoma    Inguinal hernia recurrent bilateral    Low testosterone    Osteopenia    Retinal ischemia     Past Surgical History:  Procedure Laterality Date   ABDOMINAL SURGERY     BIOPSY  03/19/2020   Procedure: BIOPSY;  Surgeon: Benancio Deeds, MD;  Location: [facility];  Service: Gastroenterology;;   COLONOSCOPY N/A 12/13/2015   Procedure: COLONOSCOPY;  Surgeon: Carman Ching, MD;  Location: [facility];  Service: Endoscopy;  Laterality: N/A;   COLONOSCOPY WITH PROPOFOL N/A 03/25/2019   Procedure: COLONOSCOPY WITH PROPOFOL;  Surgeon: Benancio Deeds, MD;  Location: [facility];  Service: Gastroenterology;  Laterality: N/A;   COLONOSCOPY WITH PROPOFOL N/A 08/26/2019   Procedure: COLONOSCOPY WITH PROPOFOL;  Surgeon: Benancio Deeds, MD;  Location: [facility];  Service: Gastroenterology;  Laterality: N/A;   COLONOSCOPY WITH PROPOFOL N/A 03/19/2020   Procedure: COLONOSCOPY WITH PROPOFOL;  Surgeon: Benancio Deeds, MD;   Location: [facility];  Service: Gastroenterology;  Laterality: N/A;   ENDOSCOPIC MUCOSAL RESECTION N/A 08/26/2019   Procedure: ENDOSCOPIC MUCOSAL RESECTION;  Surgeon: Benancio Deeds, MD;  Location: [facility];  Service: Gastroenterology;  Laterality: N/A;   INGUINAL HERNIA REPAIR Bilateral 05/21/2014   Procedure: OPEN BILATERAL INGUINAL HERNIA REPAIRS WITH MESH;  Surgeon: Manus Rudd, MD;  Location: [facility];  Service: General;  Laterality: Bilateral;   POLYPECTOMY  03/25/2019   Procedure: POLYPECTOMY;  Surgeon: Benancio Deeds, MD;  Location: [facility];  Service: Gastroenterology;;   POLYPECTOMY  08/26/2019   Procedure: POLYPECTOMY;  Surgeon: Benancio Deeds, MD;  Location: [facility];  Service: Gastroenterology;;   POLYPECTOMY  03/19/2020   Procedure: POLYPECTOMY;  Surgeon: Benancio Deeds, MD;  Location: [facility];  Service: Gastroenterology;;   Theresia Majors  08/26/2019   Procedure: Sprayed methylene blue;  Surgeon: Benancio Deeds, MD;  Location: [facility];  Service: Gastroenterology;;   SMALL INTESTINE SURGERY  1968, 2001    related to Chrohn's disease   TONSILLECTOMY      Family History  Problem Relation Age of Onset   Deep vein thrombosis Mother    Transient ischemic attack Mother    Heart disease Father    Cancer Maternal Aunt        unknown type; dx after 45   Cancer Cousin        maternal cousin; unknown type   Cancer Cousin        paternal cousin; unknown type; dx after 50   Stomach cancer Neg Hx    Colon cancer Neg Hx    Esophageal cancer Neg Hx    Pancreatic cancer Neg Hx    Rectal cancer Neg Hx    Colon polyps Neg Hx     Social History   Social History Narrative   Not on file     Review of Systems General: Denies fevers or chills Skin:***  Physical Exam    06/02/2022    8:33 AM 05/27/2022    9:32 AM 05/17/2022   10:47 AM  Vitals with BMI  Height 5\' 11"  5\' 11"  5\' 11"   Weight 167 lbs 3 oz 166 lbs 10  oz 168 lbs 13 oz  BMI 23.33 23.25 23.55  Systolic 102 131 161  Diastolic 60 72 81  Pulse 70 91 88    General:  No acute distress, nontoxic appearing  Respiratory: No increased work of breathing Neuro: Alert and oriented Psychiatric: Normal mood and affect  Skin:***  Assessment/Plan ***  Evelena Leyden 06/08/2022, 8:52 AM

## 2022-06-10 ENCOUNTER — Ambulatory Visit: Payer: Medicare Other | Admitting: Physician Assistant

## 2022-07-07 DIAGNOSIS — H401133 Primary open-angle glaucoma, bilateral, severe stage: Secondary | ICD-10-CM | POA: Diagnosis not present

## 2022-07-08 ENCOUNTER — Telehealth: Payer: Self-pay | Admitting: Internal Medicine

## 2022-07-08 NOTE — Telephone Encounter (Signed)
[facility] contacted the pt about the referral that was started on 4.24 to get him scheduled. Pt did not understand what the appointment was for or why they should have to go.   ENT office is requesting we reach out to the pt and explain the purpose of the referral.   Please call pt at: [phone]   ENT Phone: [phone]

## 2022-07-08 NOTE — Telephone Encounter (Signed)
Returned pt call. LVM for pt to call back regarding his referral question.

## 2022-07-15 ENCOUNTER — Encounter: Payer: Self-pay | Admitting: Gastroenterology

## 2022-07-18 NOTE — Telephone Encounter (Signed)
Patient returned Lalita's call. He would like a call back at [phone].

## 2022-07-19 NOTE — Telephone Encounter (Signed)
Spoke with patient today and all questions answered. 

## 2022-07-19 NOTE — Telephone Encounter (Signed)
Patient called back about his referral question. He would like a call back at [phone].

## 2022-07-26 DIAGNOSIS — R3915 Urgency of urination: Secondary | ICD-10-CM | POA: Diagnosis not present

## 2022-07-28 DIAGNOSIS — K118 Other diseases of salivary glands: Secondary | ICD-10-CM | POA: Diagnosis not present

## 2022-09-21 DIAGNOSIS — Z85828 Personal history of other malignant neoplasm of skin: Secondary | ICD-10-CM | POA: Diagnosis not present

## 2022-09-21 DIAGNOSIS — L820 Inflamed seborrheic keratosis: Secondary | ICD-10-CM | POA: Diagnosis not present

## 2022-09-21 DIAGNOSIS — D692 Other nonthrombocytopenic purpura: Secondary | ICD-10-CM | POA: Diagnosis not present

## 2022-09-21 DIAGNOSIS — D2362 Other benign neoplasm of skin of left upper limb, including shoulder: Secondary | ICD-10-CM | POA: Diagnosis not present

## 2022-09-21 DIAGNOSIS — L821 Other seborrheic keratosis: Secondary | ICD-10-CM | POA: Diagnosis not present

## 2022-09-21 DIAGNOSIS — D485 Neoplasm of uncertain behavior of skin: Secondary | ICD-10-CM | POA: Diagnosis not present

## 2022-09-21 DIAGNOSIS — L570 Actinic keratosis: Secondary | ICD-10-CM | POA: Diagnosis not present

## 2022-10-05 DIAGNOSIS — H401133 Primary open-angle glaucoma, bilateral, severe stage: Secondary | ICD-10-CM | POA: Diagnosis not present

## 2022-11-14 ENCOUNTER — Other Ambulatory Visit: Payer: Self-pay | Admitting: Internal Medicine

## 2022-11-16 ENCOUNTER — Encounter: Payer: Self-pay | Admitting: Gastroenterology

## 2022-11-16 ENCOUNTER — Other Ambulatory Visit (INDEPENDENT_AMBULATORY_CARE_PROVIDER_SITE_OTHER): Payer: Medicare Other

## 2022-11-16 ENCOUNTER — Ambulatory Visit: Payer: Medicare Other | Admitting: Gastroenterology

## 2022-11-16 VITALS — BP 132/64 | HR 70 | Wt 177.0 lb

## 2022-11-16 DIAGNOSIS — Z7901 Long term (current) use of anticoagulants: Secondary | ICD-10-CM | POA: Diagnosis not present

## 2022-11-16 DIAGNOSIS — K50019 Crohn's disease of small intestine with unspecified complications: Secondary | ICD-10-CM | POA: Diagnosis not present

## 2022-11-16 DIAGNOSIS — Z860100 Personal history of colon polyps, unspecified: Secondary | ICD-10-CM | POA: Diagnosis not present

## 2022-11-16 LAB — COMPREHENSIVE METABOLIC PANEL
ALT: 7 U/L (ref 0–53)
AST: 10 U/L (ref 0–37)
Albumin: 3.8 g/dL (ref 3.5–5.2)
Alkaline Phosphatase: 62 U/L (ref 39–117)
BUN: 14 mg/dL (ref 6–23)
CO2: 28 meq/L (ref 19–32)
Calcium: 9 mg/dL (ref 8.4–10.5)
Chloride: 102 meq/L (ref 96–112)
Creatinine, Ser: 1 mg/dL (ref 0.40–1.50)
GFR: 67.04 mL/min (ref >60.00)
Glucose, Bld: 93 mg/dL (ref 70–99)
Potassium: 4.4 meq/L (ref 3.5–5.1)
Sodium: 135 meq/L (ref 135–145)
Total Bilirubin: 1.3 mg/dL — ABNORMAL HIGH (ref 0.2–1.2)
Total Protein: 7.1 g/dL (ref 6.0–8.3)

## 2022-11-16 LAB — CBC WITH DIFFERENTIAL/PLATELET
Basophils Absolute: 0.1 10*3/uL (ref 0.0–0.1)
Basophils Relative: 1.1 % (ref 0.0–3.0)
Eosinophils Absolute: 0.2 10*3/uL (ref 0.0–0.7)
Eosinophils Relative: 3.3 % (ref 0.0–5.0)
HCT: 31.9 % — ABNORMAL LOW (ref 39.0–52.0)
Hemoglobin: 10.7 g/dL — ABNORMAL LOW (ref 13.0–17.0)
Lymphocytes Relative: 26.8 % (ref 12.0–46.0)
Lymphs Abs: 1.8 10*3/uL (ref 0.7–4.0)
MCHC: 33.7 g/dL (ref 30.0–36.0)
MCV: 88.9 fL (ref 78.0–100.0)
Monocytes Absolute: 0.7 10*3/uL (ref 0.1–1.0)
Monocytes Relative: 10.4 % (ref 3.0–12.0)
Neutro Abs: 4 10*3/uL (ref 1.4–7.7)
Neutrophils Relative %: 58.4 % (ref 43.0–77.0)
Platelets: 157 10*3/uL (ref 150.0–400.0)
RBC: 3.58 Mil/uL — ABNORMAL LOW (ref 4.22–5.81)
RDW: 19 % — ABNORMAL HIGH (ref 11.5–15.5)
WBC: 6.8 10*3/uL (ref 4.0–10.5)

## 2022-11-16 NOTE — Patient Instructions (Addendum)
If your blood pressure at your visit was 140/90 or greater, please contact your primary care physician to follow up on this. ______________________________________________________  If you are age 87 or older, your body mass index should be between 23-30. Your Body mass index is 24.69 kg/m. If this is out of the aforementioned range listed, please consider follow up with your Primary Care Provider.  If you are age 22 or younger, your body mass index should be between 19-25. Your Body mass index is 24.69 kg/m. If this is out of the aformentioned range listed, please consider follow up with your Primary Care Provider.  ________________________________________________________  The [facility] providers would like to encourage you to use [facility] to communicate with providers for non-urgent requests or questions.  Due to long hold times on the telephone, sending your provider a message by [facility] may be a faster and more efficient way to get a response.  Please allow 48 business hours for a response.  Please remember that this is for non-urgent requests.  _______________________________________________________  Due to recent changes in healthcare laws, you may see the results of your imaging and laboratory studies on MyChart before your provider has had a chance to review them.  We understand that in some cases there may be results that are confusing or concerning to you. Not all laboratory results come back in the same time frame and the provider may be waiting for multiple results in order to interpret others.  Please give [place] 48 hours in order for your provider to thoroughly review all the results before contacting the office for clarification of your results.   Please go to the lab in the basement of our building to have lab work done as you leave today. Hit "B" for basement when you get on the elevator.  When the doors open the lab is on your left.  We will call you with the results. Thank you.  Please  let us know if you need a new script for budesonide.  Thank you for entrusting me with your care and for choosing [facility], Dr. Ileene Patrick

## 2022-11-16 NOTE — Progress Notes (Signed)
HPI :  89 year old male here for follow-up for Crohn's disease and history of colon polyps.    Crohn's history: Previously followed by Dr. Danise Edge. 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 He has had 2 operations for his Crohns in 1968 and then again 2001 or so for small bowel resections. He remotely was treated with prednisone as needed in the past. He was never been on any other maintenance therapy for Crohn's disease other than prednisone and surgery (has declined biologic therapy). He has had hospitalizations in the past for bowel obstructions. Unfortunately he has suffered complications from chronic steroid use. He is currently being followed by Endocrinology for adrenal insufficiency. He has also been noted to have hepatic steatosis and avascular necrosis of the hips on CT scan. His course has also been complicated by history of diverticulosis related bleeding. Most recently has been using budesonide for flares as needed, and noted to have numerous colon polyps on multiple colonoscopy exams.    SINCE LAST VISIT:   89 y/o male here for a follow-up visit with his wife for history of Crohn's disease and colon polyps.  Recall that he has never been on any maintenance biologic therapy for this, he has declined that in the past.  He has been treated with steroids and surgery over time, course complicated by adrenal insufficiency and avascular necrosis from chronic steroid use in the remote past.  I last saw him 6 months ago.  He has generally been doing really well since have last seen him.  At the last visit he was finishing a budesonide course.  He has only used budesonide in recent years, only sparingly for flares of symptoms which are mainly abdominal pain/obstructive symptoms.  He has not required any steroids over the past 6 months.  His abdomen has not really bothered him at all.  He is generally eating without complaints.  He denies any baseline nausea or vomiting.  No diarrhea.   No blood in his stools.  Overall he is feeling really well, works out at [employer] routinely.  States he drinks plenty of water.  He remains on Eliquis, history of A-fib.  He is on B12 and vitamin D supplementation which he is compliant with.  Wife reports that he felt weak this past weekend and missed a few meals, resumed eating and states his energy level got back to normal.  Denies any fevers or sick contacts, no viral prodrome symptoms etc.  He looks well today.  His wife inquires if he should have any CT scans done at this time or pursue additional colonoscopy.  We had a lengthy discussion about this and have done so in the past.  Given he is feeling well I would not recommend any further imaging or colonoscopy exams as it would likely not change his management at this point in time.  He declines biologic therapy, I would not be surprised if he had some mildly active Crohn's disease.  We will plan on only using budesonide as needed for flares.  In regards to his colonoscopy exams, numerous polyps especially near his surgical anastomosis in the past.  Given his age and comorbidities we had collectively decided to forego further surveillance colonoscopy.  Hopefully his risk for colon cancer has been reduced by prior colonoscopy exams although understanding he had had polyp formation at his surgical anastomosis which may be unable to be completely resected endoscopically.  He is in agreement to forego further colonoscopy at this time   Prior  evaluation: Colonoscopy 12/2015 - [name] - "normal colon", patent ileocolonic anastomosis, active Crohn's ileitis   Colonoscopy 01/24/2019  - Preparation of the colon was fair. - Crohn's disease with mild ileitis. - Patent end-to-side ileo-colonic anastomosis, characterized by mild stenosis. Biopsied. - Abnormal mucosa in the ascending colon. Biopsied. - Two 3 to 6 mm polyps in the ascending colon, removed with a cold snare. Resected and retrieved. - Eight 4 to  12 mm polyps in the transverse colon, removed with a cold snare. Resected and retrieved. - One large polyp in the transverse colon. Tattooed. Not removed as outlined above - One 10 mm polyp at the splenic flexure, removed with a cold snare. Resected and retrieved. - One 10 mm polyp in the descending colon, removed with a cold snare. Resected and retrieved. - Five 5 to 10 mm polyps in the sigmoid colon, removed with a cold snare. Resected and retrieved. - Two 4 mm polyps polyps at the recto-sigmoid colon, removed with a cold snare. Resected and retrieved. - Diverticulosis in the entire examined colon. - Stool in the entire examined colon. - Colonic spasm. - The examination was otherwise normal.   Most polyps adenomatous Biopsies of right sided abnormality adenomatous   Colonoscopy 03/25/19  - Preparation of the colon was fair with significant spasm which prolonged this exam - Active ileal Crohn's disease as described - [name] i2. - Patent end-to-side ileo-colonic anastomosis, characterized by inflammation. - Numerous flat polyps noted in the ascending colon and proximal transverse colon as noted. Several minutes spent lavaging the colon and methylene blue used to evaluate the right colon to better delineate some of these especially the largest lesion which may extend into the surgical anastomosis. Largest transverse polyp removed as outlined, with another large transverse polyp noted today which was not removed given duration of procedure and colonic spasm.   FINAL MICROSCOPIC DIAGNOSIS:   A. COLON, RIGHT, POLYPECTOMY:  - Tubular adenoma (x2 fragments).  - No high grade dysplasia or malignancy.   B. COLON, RIGHT NEAR ANASTAMOSIS, POLYPECTOMY:  - Tubular adenoma (multiple fragments).  - No high grade dysplasia or malignancy.   C. COLON, PROXIMAL TRANSVERSE, POLYPECTOMY:  - Tubular adenoma (multiple fragments).  - No high grade dysplasia or malignancy.   D. COLON,  TRANSVERSE, LARGE, POLYPECTOMY:  - Tubular adenoma (multiple fragments).  - No high grade dysplasia or malignancy.      Colonoscopy 08/26/19 - Crohn's disease with mildly active ileitis. - Patent end-to-side ileo-colonic anastomosis. - Prior polypectomy site in the ascending colon, unclear if residual polyp vs. normal variant ileal tissue given location at the anatomosis. Area removed piecemeal using a cold snare. Resected and retrieved. - Chromoendoscopy applied to the proximal transverse and right colon. - Two 5 to 20 mm polyps in the transverse colon, removed piecemeal using a cold snare. Resected and retrieved. - Diverticulosis in the transverse colon and in the left colon. - The examination was otherwise normal. - Spatic colon treated with glucagon. Overall, very challenging exam, time needed to clear the colon, visualization difficult given spasm. I think the colon at this point has been cleared of all high risk lesions however difficult to say if there is residual / recurrence at the polyp site near the anastomosis. Under normal circumstances surgery would be recommended to remove right colon and anastomosis however given patient's age he has elected for colonoscopy surveillance.    FINAL MICROSCOPIC DIAGNOSIS:   A. COLON, RIGHT, SNARE POLYPECTOMY:  - Tubular adenoma(s)  - Negative  for high-grade dysplasia or malignancy   B. COLON, TRANSVERSE, SNARE POLYPECTOMY:  - Tubular adenoma(s)  - Negative for high-grade dysplasia or malignancy   C. COLON, TRANSVERSE, SNARE POLYPECTOMY:  - Tubular adenoma(s)  - Negative for high-grade dysplasia or malignancy     Colonoscopy 03/19/20 - - Crohn's disease with mildly active ileitis. No overt polypoid tissue at the entrance of the ileum but multiple biopsies taken. - Patent end-to-end ileo-colonic anastomosis. - ? polypoid lesion in the ascending colon just distal to the anastomosis, in close approximation, vs. scar tissue or normal  variant as above, very difficult to see clear borders. Area was removed with cold snare, - Diverticulosis in the transverse colon and in the left colon. - The examination was otherwise normal. No high risk lesions otherwise appreciated.   FINAL MICROSCOPIC DIAGNOSIS:   A. COLON, ASCENDING, POLYPECTOMY:  - Fragments of polypoid colonic mucosa showing low-grade dysplasia with  a tubular architecture.  See comment  - Inflammatory polyp  - Other fragments of polypoid colonic mucosa with no specific  histopathologic changes   B. SURGICAL ANASTAMOSIS, BIOPSY:  - Colonic mucosa with nonspecific inflammatory and architectural  changes, consistent with anastomotic site  - Negative for granulomas or dysplasia    Echo 09/23/20 - EF 55-60%, grade I DD    Past Medical History:  Diagnosis Date   Adrenal insufficiency (HCC)    Anemia    Atrial fibrillation (HCC)    Avascular necrosis (HCC)    Clavicle fracture 11/02/2017   Colon polyp    Colon polyps    Crohn's disease (HCC)    Glaucoma    Inguinal hernia recurrent bilateral    Low testosterone    Osteopenia    Retinal ischemia      Past Surgical History:  Procedure Laterality Date   ABDOMINAL SURGERY     BIOPSY  03/19/2020   Procedure: BIOPSY;  Surgeon: Benancio Deeds, MD;  Location: [facility];  Service: Gastroenterology;;   COLONOSCOPY N/A 12/13/2015   Procedure: COLONOSCOPY;  Surgeon: Carman Ching, MD;  Location: [facility];  Service: Endoscopy;  Laterality: N/A;   COLONOSCOPY WITH PROPOFOL N/A 03/25/2019   Procedure: COLONOSCOPY WITH PROPOFOL;  Surgeon: Benancio Deeds, MD;  Location: [facility];  Service: Gastroenterology;  Laterality: N/A;   COLONOSCOPY WITH PROPOFOL N/A 08/26/2019   Procedure: COLONOSCOPY WITH PROPOFOL;  Surgeon: Benancio Deeds, MD;  Location: [facility];  Service: Gastroenterology;  Laterality: N/A;   COLONOSCOPY WITH PROPOFOL N/A 03/19/2020   Procedure: COLONOSCOPY WITH PROPOFOL;   Surgeon: Benancio Deeds, MD;  Location: [facility];  Service: Gastroenterology;  Laterality: N/A;   ENDOSCOPIC MUCOSAL RESECTION N/A 08/26/2019   Procedure: ENDOSCOPIC MUCOSAL RESECTION;  Surgeon: Benancio Deeds, MD;  Location: [facility];  Service: Gastroenterology;  Laterality: N/A;   INGUINAL HERNIA REPAIR Bilateral 05/21/2014   Procedure: OPEN BILATERAL INGUINAL HERNIA REPAIRS WITH MESH;  Surgeon: Manus Rudd, MD;  Location: [facility];  Service: General;  Laterality: Bilateral;   POLYPECTOMY  03/25/2019   Procedure: POLYPECTOMY;  Surgeon: Benancio Deeds, MD;  Location: [facility];  Service: Gastroenterology;;   POLYPECTOMY  08/26/2019   Procedure: POLYPECTOMY;  Surgeon: Benancio Deeds, MD;  Location: [facility];  Service: Gastroenterology;;   POLYPECTOMY  03/19/2020   Procedure: POLYPECTOMY;  Surgeon: Benancio Deeds, MD;  Location: [facility];  Service: Gastroenterology;;   Theresia Majors  08/26/2019   Procedure: Sprayed methylene blue;  Surgeon: Benancio Deeds, MD;  Location: [facility];  Service: Gastroenterology;;   SMALL INTESTINE SURGERY  1968, 2001    related to Chrohn's disease   TONSILLECTOMY     Family History  Problem Relation Age of Onset   Deep vein thrombosis Mother    Transient ischemic attack Mother    Heart disease Father    Cancer Maternal Aunt        unknown type; dx after 79   Cancer Cousin        maternal cousin; unknown type   Cancer Cousin        paternal cousin; unknown type; dx after 100   Stomach cancer Neg Hx    Colon cancer Neg Hx    Esophageal cancer Neg Hx    Pancreatic cancer Neg Hx    Rectal cancer Neg Hx    Colon polyps Neg Hx    Social History   Tobacco Use   Smoking status: Former    Current packs/day: 0.00    Types: Cigarettes    Quit date: 04/01/1976    Years since quitting: 46.6   Smokeless tobacco: Never  Vaping Use   Vaping status: Never Used  Substance Use Topics    Alcohol use: Yes    Alcohol/week: 2.0 standard drinks of alcohol    Types: 2 Glasses of wine per week    Comment: social   Drug use: No   Current Outpatient Medications  Medication Sig Dispense Refill   acetaminophen (TYLENOL) 500 MG tablet Take 500-1,000 mg by mouth every 8 (eight) hours as needed for mild pain or headache.      apixaban (ELIQUIS) 5 MG TABS tablet Take 1 tablet (5 mg total) by mouth 2 (two) times daily. 60 tablet 5   Ascorbic Acid (VITAMIN C) 1000 MG tablet Take 1,000 mg by mouth daily with breakfast.      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      brimonidine (ALPHAGAN) 0.2 % ophthalmic solution Place 1 drop into the left eye in the morning and at bedtime. Instill 1 drop into the left eye twice a day- 6:45 AM and 5:50 PM     calcium carbonate (CALCIUM 600) 600 MG TABS tablet Take 1 tablet (600 mg total) by mouth daily. 30 tablet    Cholecalciferol (VITAMIN D3) 50 MCG (2000 UT) TABS Take 2,000 Units by mouth daily with breakfast.     cyanocobalamin (VITAMIN B12) 1000 MCG/ML injection INJECT INTO THE MUSCLE ONCE MONTHLY 10 mL 0   desmopressin (DDAVP) 0.1 MG tablet Take 100 mcg by mouth daily.     dorzolamide-timolol (COSOPT) 22.3-6.8 MG/ML ophthalmic solution Place 1 drop into the left eye 2 (two) times daily.     latanoprost (XALATAN) 0.005 % ophthalmic solution Place 1 drop into the left eye at bedtime.     loperamide (IMODIUM A-D) 2 MG tablet Take 2 mg by mouth daily as needed for diarrhea or loose stools.     metoprolol succinate (TOPROL-XL) 25 MG 24 hr tablet Take 0.5 tablets (12.5 mg total) by mouth daily. 45 tablet 3   Netarsudil Dimesylate 0.02 % SOLN Place 1 drop into the left eye at bedtime.     omeprazole (PRILOSEC OTC) 20 MG tablet Take 20 mg by mouth daily before breakfast.     SYRINGE-NEEDLE, DISP, 3 ML (B-D 3CC LUER-LOK SYR 25GX1") 25G X 1" 3 ML MISC  use monthly for b-12 injections 12 each 0   tamsulosin (  FLOMAX) 0.4 MG CAPS capsule Take 0.4 mg by mouth at bedtime.     No current facility-administered medications for this visit.   No Known Allergies   Review of Systems: All systems reviewed and negative except where noted in HPI.   Lab Results  Component Value Date   WBC 9.3 05/11/2022   HGB 12.6 (L) 05/11/2022   HCT 36.6 (L) 05/11/2022   MCV 90.5 05/11/2022   PLT 220.0 05/11/2022    Lab Results  Component Value Date   NA 136 05/11/2022   CL 100 05/11/2022   K 4.0 05/11/2022   CO2 29 05/11/2022   BUN 16 05/11/2022   CREATININE 0.91 05/11/2022   GFR 75.35 05/11/2022   CALCIUM 9.1 05/11/2022   ALBUMIN 4.0 05/11/2022   GLUCOSE 84 05/11/2022    Lab Results  Component Value Date   ALT 9 05/11/2022   AST 15 05/11/2022   ALKPHOS 48 05/11/2022   BILITOT 1.0 05/11/2022     Physical Exam: BP 132/64   Pulse 70   Wt 177 lb (80.3 kg)   BMI 24.69 kg/m  Constitutional: Pleasant,well-developed, male in no acute distress. Neurological: Alert and oriented to person place and time. Psychiatric: Normal mood and affect. Behavior is normal.   ASSESSMENT: 89 y.o. male here for assessment of the following  1. Crohn's disease of small intestine with complication (HCC)   2. History of colon polyps   3. Anticoagulated    Generally doing really well off all therapy for Crohn's disease.  Lengthy discussion with the patient and his wife again today.  He has historically declined biologic therapy, more recently has been using budesonide as needed for flares and fortunately has not required that at least over the past 6 months and remained out of the hospital.  He will continue observation for now, will make sure he has budesonide at home in case he needs this and if he does feel that he needs to use it he should contact me.  I advised him to go on a liquid diet if he has any recurrent or persistent abdominal pain.  No plans for  imaging at this time unless he is having symptoms that bother him, as I do not think it would influence management, he would decline more aggressively Crohn's disease regardless if it is active.  I will check labs today to make sure they are stable and no worsening anemia etc.  We also had another discussion about whether or not to pursue colonoscopy exams.  See above, I feel risks of continued colonoscopy outweigh the benefits for him given his age and comorbidities, and hopefully risk for colon cancer has been reduced with his prior exams.  He is in agreement and wishes to forego further surveillance colonoscopy.  I will see him in 6 to 9 months for reassessment, or sooner with any issues.  He agrees  PLAN: - lab for CBC, CMET - continue budesonide PRN. He will contact me if he needs this, will prescribe him a taper to have at home in case it is needed. Declines biologic therapy - no further colonoscopy exams or imaging if feeling well - f/u 6-9 months  Harlin Rain, MD [facility]

## 2022-11-17 ENCOUNTER — Other Ambulatory Visit: Payer: Self-pay | Admitting: Gastroenterology

## 2022-11-17 DIAGNOSIS — K50019 Crohn's disease of small intestine with unspecified complications: Secondary | ICD-10-CM

## 2022-11-17 DIAGNOSIS — D649 Anemia, unspecified: Secondary | ICD-10-CM

## 2022-11-18 ENCOUNTER — Other Ambulatory Visit (INDEPENDENT_AMBULATORY_CARE_PROVIDER_SITE_OTHER): Payer: Medicare Other

## 2022-11-18 DIAGNOSIS — K50019 Crohn's disease of small intestine with unspecified complications: Secondary | ICD-10-CM | POA: Diagnosis not present

## 2022-11-18 DIAGNOSIS — D649 Anemia, unspecified: Secondary | ICD-10-CM | POA: Diagnosis not present

## 2022-11-18 LAB — CBC WITH DIFFERENTIAL/PLATELET
Basophils Absolute: 0.1 10*3/uL (ref 0.0–0.1)
Basophils Relative: 1.1 % (ref 0.0–3.0)
Eosinophils Absolute: 0.3 10*3/uL (ref 0.0–0.7)
Eosinophils Relative: 3.4 % (ref 0.0–5.0)
HCT: 31.6 % — ABNORMAL LOW (ref 39.0–52.0)
Hemoglobin: 10.9 g/dL — ABNORMAL LOW (ref 13.0–17.0)
Lymphocytes Relative: 29.8 % (ref 12.0–46.0)
Lymphs Abs: 2.5 10*3/uL (ref 0.7–4.0)
MCHC: 34.5 g/dL (ref 30.0–36.0)
MCV: 89.5 fL (ref 78.0–100.0)
Monocytes Absolute: 0.8 10*3/uL (ref 0.1–1.0)
Monocytes Relative: 9.8 % (ref 3.0–12.0)
Neutro Abs: 4.6 10*3/uL (ref 1.4–7.7)
Neutrophils Relative %: 55.9 % (ref 43.0–77.0)
Platelets: 162 10*3/uL (ref 150.0–400.0)
RBC: 3.53 Mil/uL — ABNORMAL LOW (ref 4.22–5.81)
RDW: 19.4 % — ABNORMAL HIGH (ref 11.5–15.5)
WBC: 8.3 10*3/uL (ref 4.0–10.5)

## 2022-11-18 LAB — VITAMIN B12: Vitamin B-12: 523 pg/mL (ref 211–911)

## 2022-11-18 LAB — FOLATE: Folate: 20.3 ng/mL (ref >5.9)

## 2022-11-18 LAB — FERRITIN: Ferritin: 60.6 ng/mL (ref 22.0–322.0)

## 2022-11-27 NOTE — Assessment & Plan Note (Signed)
Chronic Likely anemia of chronic disease Has B12 def - current B12 level is normal Has h/o iron def - current ferritin level normal-will check other iron levels at his next visit Folate less than level normal Has chron's disease-in remission and not currently on any medication Blood work over the number of years has been fairly stable but ups and downs depending on active issues Discussed hematology/oncology referral-he would prefer not to see anyone else we did not need to Follow-up with me in 3 months for physical-blood work prior We will reevaluate at his next visit and see if he should consider seeing hematology/oncology

## 2022-11-27 NOTE — Patient Instructions (Incomplete)
       Medications changes include :   none      Return in about 3 months (around 02/28/2023) for Physical Exam, schedule lab appt one week prior.

## 2022-11-27 NOTE — Progress Notes (Unsigned)
Subjective:    Patient ID: Michael [name], male    DOB: [birth year], 89 y.o.   MRN: [id number]     HPI Michael [name] is here for follow up of his chronic medical problems.  Anemia - stable over the past year.  HGB recently 10.9.  B12 level good but has been low in the past.  Ferritin, folate normal.   No fatigue / low energy.  He works out at [employer] 4 days a week.    He denies any blood in the stool or blood in the urine.  He has Crohn's disease and is not on any medication for it.  He currently does not have any symptoms-he has budesonide on hand if needed.  Medications and allergies reviewed with patient and updated if appropriate.  Current Outpatient Medications on File Prior to Visit  Medication Sig Dispense Refill   acetaminophen (TYLENOL) 500 MG tablet Take 500-1,000 mg by mouth every 8 (eight) hours as needed for mild pain or headache.      apixaban (ELIQUIS) 5 MG TABS tablet Take 1 tablet (5 mg total) by mouth 2 (two) times daily. 60 tablet 5   Ascorbic Acid (VITAMIN C) 1000 MG tablet Take 1,000 mg by mouth daily with breakfast.      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      brimonidine (ALPHAGAN) 0.2 % ophthalmic solution Place 1 drop into the left eye in the morning and at bedtime. Instill 1 drop into the left eye twice a day- 6:45 AM and 5:50 PM     calcium carbonate (CALCIUM 600) 600 MG TABS tablet Take 1 tablet (600 mg total) by mouth daily. 30 tablet    Cholecalciferol (VITAMIN D3) 50 MCG (2000 UT) TABS Take 2,000 Units by mouth daily with breakfast.     cyanocobalamin (VITAMIN B12) 1000 MCG/ML injection INJECT INTO THE MUSCLE ONCE MONTHLY 10 mL 0   desmopressin (DDAVP) 0.1 MG tablet Take 100 mcg by mouth daily.     dorzolamide-timolol (COSOPT) 22.3-6.8 MG/ML ophthalmic solution Place 1 drop into the left eye 2 (two) times daily.     latanoprost (XALATAN)  0.005 % ophthalmic solution Place 1 drop into the left eye at bedtime.     loperamide (IMODIUM A-D) 2 MG tablet Take 2 mg by mouth daily as needed for diarrhea or loose stools.     metoprolol succinate (TOPROL-XL) 25 MG 24 hr tablet Take 0.5 tablets (12.5 mg total) by mouth daily. 45 tablet 3   Netarsudil Dimesylate 0.02 % SOLN Place 1 drop into the left eye at bedtime.     omeprazole (PRILOSEC OTC) 20 MG tablet Take 20 mg by mouth daily before breakfast.     SYRINGE-NEEDLE, DISP, 3 ML (B-D 3CC LUER-LOK SYR 25GX1") 25G X 1" 3 ML MISC use monthly for b-12 injections 12 each 0   tamsulosin (FLOMAX) 0.4 MG CAPS capsule Take 0.4 mg by mouth at bedtime.     No current facility-administered medications on file prior to visit.     Review of Systems     Objective:   Vitals:   11/28/22 1053  BP: 114/70  Pulse: 70  Temp: 98 F (36.7 C)  SpO2: 98%   BP Readings from Last 3 Encounters:  11/28/22 114/70  11/16/22 132/64  06/02/22 102/60   Wt Readings from Last 3 Encounters:  11/28/22 175 lb (79.4 kg)  11/16/22 177 lb (80.3 kg)  06/02/22 167 lb 3.2 oz (75.8 kg)   Body mass index is 24.41 kg/m.    Physical Exam Constitutional:      General: He is not in acute distress.    Appearance: Normal appearance. He is not ill-appearing.  HENT:     Head: Normocephalic and atraumatic.  Skin:    General: Skin is warm and dry.  Neurological:     Mental Status: He is alert. Mental status is at baseline.  Psychiatric:        Mood and Affect: Mood normal.        Behavior: Behavior normal.        Thought Content: Thought content normal.        Judgment: Judgment normal.        Lab Results  Component Value Date   WBC 8.3 11/18/2022   HGB 10.9 (L) 11/18/2022   HCT 31.6 (L) 11/18/2022   PLT 162.0 11/18/2022   GLUCOSE 93 11/16/2022   CHOL 118 07/27/2021   TRIG 176.0 (H) 07/27/2021   HDL 33.10 (L) 07/27/2021   LDLDIRECT 60.6 09/11/2019   LDLCALC 50 07/27/2021   ALT 7 11/16/2022   AST  10 11/16/2022   NA 135 11/16/2022   K 4.4 11/16/2022   CL 102 11/16/2022   CREATININE 1.00 11/16/2022   BUN 14 11/16/2022   CO2 28 11/16/2022   TSH 2.06 05/11/2022   INR 1.4 (H) 10/26/2021   HGBA1C 5.6 01/14/2021     Assessment & Plan:    See Problem List for Assessment and Plan of chronic medical problems.

## 2022-11-28 ENCOUNTER — Ambulatory Visit (INDEPENDENT_AMBULATORY_CARE_PROVIDER_SITE_OTHER): Payer: Medicare Other | Admitting: Internal Medicine

## 2022-11-28 ENCOUNTER — Encounter: Payer: Self-pay | Admitting: Internal Medicine

## 2022-11-28 VITALS — BP 114/70 | HR 70 | Temp 98.0°F | Ht 71.0 in | Wt 175.0 lb

## 2022-11-28 DIAGNOSIS — I10 Essential (primary) hypertension: Secondary | ICD-10-CM

## 2022-11-28 DIAGNOSIS — D649 Anemia, unspecified: Secondary | ICD-10-CM | POA: Diagnosis not present

## 2022-11-28 DIAGNOSIS — E559 Vitamin D deficiency, unspecified: Secondary | ICD-10-CM

## 2022-11-28 DIAGNOSIS — E538 Deficiency of other specified B group vitamins: Secondary | ICD-10-CM

## 2022-11-28 DIAGNOSIS — I700 Atherosclerosis of aorta: Secondary | ICD-10-CM

## 2022-11-28 MED ORDER — BUDESONIDE 3 MG PO CPEP: ORAL_CAPSULE | ORAL | Status: DC

## 2022-11-28 NOTE — Assessment & Plan Note (Signed)
Chronic Blood pressure very well-controlled Continue metoprolol XL 12.5 mg daily

## 2022-12-13 ENCOUNTER — Ambulatory Visit: Payer: Medicare Other | Admitting: Cardiovascular Disease

## 2022-12-28 DIAGNOSIS — H401133 Primary open-angle glaucoma, bilateral, severe stage: Secondary | ICD-10-CM | POA: Diagnosis not present

## 2023-01-13 ENCOUNTER — Other Ambulatory Visit: Payer: Self-pay | Admitting: Internal Medicine

## 2023-01-16 ENCOUNTER — Encounter: Payer: Self-pay | Admitting: Cardiovascular Disease

## 2023-01-16 ENCOUNTER — Ambulatory Visit: Payer: Medicare Other | Attending: Cardiovascular Disease | Admitting: Cardiovascular Disease

## 2023-01-16 VITALS — BP 120/66 | HR 72 | Ht 71.0 in | Wt 178.8 lb

## 2023-01-16 DIAGNOSIS — I480 Paroxysmal atrial fibrillation: Secondary | ICD-10-CM | POA: Diagnosis not present

## 2023-01-16 NOTE — Progress Notes (Signed)
 Chief Complaint  Patient presents with   Follow-up    Atrial fib   History of Present Illness: 89 yo male with history of atrial fibrillation, TIA, memory loss, DVT, adrenal insufficiency, Crohn's disease and mild carotid artery disease here today for cardiac follow up. I saw him as a new patient in November 2021 after he had been admitted to [facility] 09/11/19 with a TIA. Carotid artery dopplers 09/11/19 with mild bilateral carotid artery disease. Echo September 2021 with LVEF=55-60%, no valve disease. CT showed probably remote L thalamic lacunar infarct which was confirmed on MRI brain. Cardiac monitor showed atrial fibrillation. He was started on Eliquis  and a beta blocker. He was admitted to [facility] September 2022 with balance issues but workup for stroke was negative with brain MRI. Echo September 2022 with LVEF=55-60%. Trivial AI and MR. He was admitted to [facility] 10/26/21 with altered mental status and was found to have pneumonia, Covid positive.  He was febrile during the admission and has rapid atrial fib. He converted to sinus with IV Cardizem .   He is here today for follow up. The patient denies any chest pain, dyspnea, palpitations, lower extremity edema, orthopnea, PND, dizziness, near syncope or syncope.   Primary Care Physician: [name], MD  Past Medical History:  Diagnosis Date   Adrenal insufficiency [facility])    Anemia    Atrial fibrillation (HCC)    Avascular necrosis (HCC)    Clavicle fracture 11/02/2017   Colon polyp    Colon polyps    Crohn's disease (HCC)    Glaucoma    Inguinal hernia recurrent bilateral    Low testosterone     Osteopenia    Retinal ischemia     Past Surgical History:  Procedure Laterality Date   ABDOMINAL SURGERY     BIOPSY  03/19/2020   Procedure: BIOPSY;  Surgeon: Leigh Elspeth SQUIBB, MD;  Location: [facility];  Service: Gastroenterology;;   COLONOSCOPY N/A 12/13/2015   Procedure: COLONOSCOPY;  Surgeon: Lynwood Bohr, MD;  Location: [facility];   Service: Endoscopy;  Laterality: N/A;   COLONOSCOPY WITH PROPOFOL  N/A 03/25/2019   Procedure: COLONOSCOPY WITH PROPOFOL ;  Surgeon: Leigh Elspeth SQUIBB, MD;  Location: [facility];  Service: Gastroenterology;  Laterality: N/A;   COLONOSCOPY WITH PROPOFOL  N/A 08/26/2019   Procedure: COLONOSCOPY WITH PROPOFOL ;  Surgeon: Leigh Elspeth SQUIBB, MD;  Location: [facility];  Service: Gastroenterology;  Laterality: N/A;   COLONOSCOPY WITH PROPOFOL  N/A 03/19/2020   Procedure: COLONOSCOPY WITH PROPOFOL ;  Surgeon: Leigh Elspeth SQUIBB, MD;  Location: [facility];  Service: Gastroenterology;  Laterality: N/A;   ENDOSCOPIC MUCOSAL RESECTION N/A 08/26/2019   Procedure: ENDOSCOPIC MUCOSAL RESECTION;  Surgeon: Leigh Elspeth SQUIBB, MD;  Location: [facility];  Service: Gastroenterology;  Laterality: N/A;   INGUINAL HERNIA REPAIR Bilateral 05/21/2014   Procedure: OPEN BILATERAL INGUINAL HERNIA REPAIRS WITH MESH;  Surgeon: Donnice Lima, MD;  Location: [facility];  Service: General;  Laterality: Bilateral;   POLYPECTOMY  03/25/2019   Procedure: POLYPECTOMY;  Surgeon: Leigh Elspeth SQUIBB, MD;  Location: [facility];  Service: Gastroenterology;;   POLYPECTOMY  08/26/2019   Procedure: POLYPECTOMY;  Surgeon: Leigh Elspeth SQUIBB, MD;  Location: [facility];  Service: Gastroenterology;;   POLYPECTOMY  03/19/2020   Procedure: POLYPECTOMY;  Surgeon: Leigh Elspeth SQUIBB, MD;  Location: [facility];  Service: Gastroenterology;;   WALDEMAR  08/26/2019   Procedure: Sprayed methylene blue ;  Surgeon: Leigh Elspeth SQUIBB, MD;  Location: [facility];  Service: Gastroenterology;;   SMALL INTESTINE SURGERY  1968, 2001    related to Chrohn's disease   TONSILLECTOMY      Current Outpatient Medications  Medication Sig Dispense Refill   acetaminophen  (TYLENOL ) 500 MG tablet Take 500-1,000 mg by mouth every 8 (eight) hours as needed for mild pain or headache.      apixaban  (ELIQUIS ) 5 MG TABS tablet Take 1  tablet (5 mg total) by mouth 2 (two) times daily. 60 tablet 5   Ascorbic Acid  (VITAMIN C ) 1000 MG tablet Take 1,000 mg by mouth daily with breakfast.      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      B-D 3CC LUER-LOK SYR 25GX1 25G X 1 3 ML MISC USE MONTHLY FOR B-12 INJECTIONS 12 each 0   brimonidine  (ALPHAGAN ) 0.2 % ophthalmic solution Place 1 drop into the left eye in the morning and at bedtime. Instill 1 drop into the left eye twice a day- 6:45 AM and 5:50 PM     budesonide  (ENTOCORT EC ) 3 MG 24 hr capsule AS NEEDED FOR FLARETake 3 capsules (9 mg total) by mouth daily, THEN 2 capsules (6 mg total) daily, THEN 1 capsule (3 mg total) daily. AS NEEDED FOR FLARE.     calcium  carbonate (CALCIUM  600) 600 MG TABS tablet Take 1 tablet (600 mg total) by mouth daily. 30 tablet    Cholecalciferol  (VITAMIN D3) 50 MCG (2000 UT) TABS Take 2,000 Units by mouth daily with breakfast.     cyanocobalamin  (VITAMIN B12) 1000 MCG/ML injection INJECT 1ML INTO THE MUSCLE ONCE MONTHLY 10 mL 0   desmopressin  (DDAVP ) 0.1 MG tablet Take 100 mcg by mouth daily.     dorzolamide -timolol  (COSOPT ) 22.3-6.8 MG/ML ophthalmic solution Place 1 drop into the left eye 2 (two) times daily.     latanoprost  (XALATAN ) 0.005 % ophthalmic solution Place 1 drop into the left eye at bedtime.     loperamide  (IMODIUM  A-D) 2 MG tablet Take 2 mg by mouth daily as needed for diarrhea or loose stools.     metoprolol  succinate (TOPROL -XL) 25 MG 24 hr tablet Take 0.5 tablets (12.5 mg total) by mouth daily. 45 tablet 3   Netarsudil  Dimesylate 0.02 % SOLN Place 1 drop into the left eye at bedtime.     omeprazole  (PRILOSEC  OTC) 20 MG tablet Take 20 mg by mouth daily before breakfast.     tamsulosin  (FLOMAX ) 0.4 MG CAPS capsule Take 0.4 mg by mouth at bedtime.     No current facility-administered medications for this visit.    No Known  Allergies  Social History   Socioeconomic History   Marital status: Married    Spouse name: Not on file   Number of children: 3   Years of education: Not on file   Highest education level: Not on file  Occupational History   Occupation: Retired  Tobacco Use   Smoking status: Former    Current packs/day: 0.00    Types: Cigarettes    Quit date: 04/01/1976    Years since quitting: 46.8   Smokeless tobacco: Never  Vaping Use   Vaping status: Never Used  Substance and Sexual Activity   Alcohol use: Yes    Alcohol/week: 2.0 standard drinks of alcohol    Types: 2 Glasses of wine per week    Comment: social   Drug use: No   Sexual activity: Not Currently  Other Topics Concern   Not on file  Social  History Narrative   Not on file   Social Drivers of Health   Financial Resource Strain: Not on file  Food Insecurity: Not on file  Transportation Needs: Not on file  Physical Activity: Not on file  Stress: Not on file  Social Connections: Not on file  Intimate Partner Violence: Not on file    Family History  Problem Relation Age of Onset   Deep vein thrombosis Mother    Transient ischemic attack Mother    Heart disease Father    Cancer Maternal Aunt        unknown type; dx after 61   Cancer Cousin        maternal cousin; unknown type   Cancer Cousin        paternal cousin; unknown type; dx after 21   Stomach cancer Neg Hx    Colon cancer Neg Hx    Esophageal cancer Neg Hx    Pancreatic cancer Neg Hx    Rectal cancer Neg Hx    Colon polyps Neg Hx     Review of Systems:  As stated in the HPI and otherwise negative.   BP 120/66   Pulse 72   Ht 5' 11 (1.803 m)   Wt 81.1 kg   SpO2 99%   BMI 24.94 kg/m   Physical Examination: General: Well developed, well nourished, NAD  HEENT: OP clear, mucus membranes moist  SKIN: warm, dry. No rashes. Neuro: No focal deficits  Musculoskeletal: Muscle strength 5/5 all ext  Psychiatric: Mood and affect normal  Neck: No JVD,  no carotid bruits, no thyromegaly, no lymphadenopathy.  Lungs:Clear bilaterally, no wheezes, rhonci, crackles Cardiovascular: Regular rate and rhythm. No murmurs, gallops or rubs. Abdomen:Soft. Bowel sounds present. Non-tender.  Extremities: No lower extremity edema. Pulses are 2 + in the bilateral DP/PT.  EKG:  EKG is  ordered today. The ekg ordered today demonstrates   EKG Interpretation Date/Time:  Monday January 16 2023 14:45:33 EST Ventricular Rate:  66 PR Interval:  170 QRS Duration:  106 QT Interval:  410 QTC Calculation: 429 R Axis:   29  Text Interpretation: Normal sinus rhythm Incomplete right bundle branch block When compared with ECG of 27-Oct-2021 23:33, No significant change was found Confirmed by Verlin Bruckner [phone]) on 01/16/2023 2:52:15 PM   Echo September 2022: Recent Labs: 05/11/2022: TSH 2.06 11/16/2022: ALT 7; BUN 14; Creatinine, Ser 1.00; Potassium 4.4; Sodium 135 11/18/2022: Hemoglobin 10.9; Platelets 162.0   Lipid Panel    Component Value Date/Time   CHOL 118 07/27/2021 1207   TRIG 176.0 (H) 07/27/2021 1207   HDL 33.10 (L) 07/27/2021 1207   CHOLHDL 4 07/27/2021 1207   VLDL 35.2 07/27/2021 1207   LDLCALC 50 07/27/2021 1207   LDLDIRECT 60.6 09/11/2019 1401     Wt Readings from Last 3 Encounters:  01/16/23 81.1 kg  11/28/22 79.4 kg  11/16/22 80.3 kg    Assessment and Plan:   1. Atrial fibrillation, paroxysmal: Sinus today. Will continue Toprol  and Eliquis .   Labs/ tests ordered today include:   Orders Placed This Encounter  Procedures   EKG 12-Lead    Disposition:   F/U with me in 12 months.   Signed, Bruckner Verlin, MD 01/16/2023 3:35 PM    [facility] [address] [place], [place], [place]  [address] Phone: [phone]; Fax: [phone]

## 2023-01-16 NOTE — Patient Instructions (Signed)

## 2023-02-14 ENCOUNTER — Other Ambulatory Visit: Payer: Self-pay | Admitting: Cardiovascular Disease

## 2023-02-21 ENCOUNTER — Other Ambulatory Visit (INDEPENDENT_AMBULATORY_CARE_PROVIDER_SITE_OTHER): Payer: Medicare Other

## 2023-02-21 DIAGNOSIS — I700 Atherosclerosis of aorta: Secondary | ICD-10-CM | POA: Diagnosis not present

## 2023-02-21 DIAGNOSIS — D649 Anemia, unspecified: Secondary | ICD-10-CM | POA: Diagnosis not present

## 2023-02-21 DIAGNOSIS — E559 Vitamin D deficiency, unspecified: Secondary | ICD-10-CM

## 2023-02-21 DIAGNOSIS — E538 Deficiency of other specified B group vitamins: Secondary | ICD-10-CM

## 2023-02-21 DIAGNOSIS — I10 Essential (primary) hypertension: Secondary | ICD-10-CM | POA: Diagnosis not present

## 2023-02-21 LAB — CBC WITH DIFFERENTIAL/PLATELET
Basophils Absolute: 0.1 10*3/uL (ref 0.0–0.1)
Basophils Relative: 1.5 % (ref 0.0–3.0)
Eosinophils Absolute: 0.2 10*3/uL (ref 0.0–0.7)
Eosinophils Relative: 3.2 % (ref 0.0–5.0)
HCT: 32.3 % — ABNORMAL LOW (ref 39.0–52.0)
Hemoglobin: 10.9 g/dL — ABNORMAL LOW (ref 13.0–17.0)
Lymphocytes Relative: 29.5 % (ref 12.0–46.0)
Lymphs Abs: 1.8 10*3/uL (ref 0.7–4.0)
MCHC: 33.7 g/dL (ref 30.0–36.0)
MCV: 88.2 fL (ref 78.0–100.0)
Monocytes Absolute: 0.5 10*3/uL (ref 0.1–1.0)
Monocytes Relative: 9 % (ref 3.0–12.0)
Neutro Abs: 3.4 10*3/uL (ref 1.4–7.7)
Neutrophils Relative %: 56.8 % (ref 43.0–77.0)
Platelets: 151 10*3/uL (ref 150.0–400.0)
RBC: 3.66 Mil/uL — ABNORMAL LOW (ref 4.22–5.81)
RDW: 20.3 % — ABNORMAL HIGH (ref 11.5–15.5)
WBC: 6 10*3/uL (ref 4.0–10.5)

## 2023-02-21 LAB — VITAMIN B12: Vitamin B-12: 404 pg/mL (ref 211–911)

## 2023-02-21 LAB — LIPID PANEL
Cholesterol: 82 mg/dL (ref 0–200)
HDL: 31.4 mg/dL — ABNORMAL LOW (ref >39.00)
LDL Cholesterol: 32 mg/dL (ref 0–99)
NonHDL: 50.2
Total CHOL/HDL Ratio: 3
Triglycerides: 92 mg/dL (ref 0.0–149.0)
VLDL: 18.4 mg/dL (ref 0.0–40.0)

## 2023-02-21 LAB — IBC PANEL
Iron: 98 ug/dL (ref 42–165)
Saturation Ratios: 34.8 % (ref 20.0–50.0)
TIBC: 281.4 ug/dL (ref 250.0–450.0)
Transferrin: 201 mg/dL — ABNORMAL LOW (ref 212.0–360.0)

## 2023-02-21 LAB — TSH: TSH: 2.5 u[IU]/mL (ref 0.35–5.50)

## 2023-02-21 LAB — COMPREHENSIVE METABOLIC PANEL
ALT: 7 U/L (ref 0–53)
AST: 13 U/L (ref 0–37)
Albumin: 3.7 g/dL (ref 3.5–5.2)
Alkaline Phosphatase: 68 U/L (ref 39–117)
BUN: 13 mg/dL (ref 6–23)
CO2: 29 meq/L (ref 19–32)
Calcium: 8.8 mg/dL (ref 8.4–10.5)
Chloride: 98 meq/L (ref 96–112)
Creatinine, Ser: 0.96 mg/dL (ref 0.40–1.50)
GFR: 70.28 mL/min (ref >60.00)
Glucose, Bld: 103 mg/dL — ABNORMAL HIGH (ref 70–99)
Potassium: 4.4 meq/L (ref 3.5–5.1)
Sodium: 133 meq/L — ABNORMAL LOW (ref 135–145)
Total Bilirubin: 1.4 mg/dL — ABNORMAL HIGH (ref 0.2–1.2)
Total Protein: 7 g/dL (ref 6.0–8.3)

## 2023-02-21 LAB — RETICULOCYTES
ABS Retic: 54000 {cells}/uL (ref 25000–90000)
Retic Ct Pct: 1.5 %

## 2023-02-21 LAB — FERRITIN: Ferritin: 58.8 ng/mL (ref 22.0–322.0)

## 2023-02-21 LAB — VITAMIN D 25 HYDROXY (VIT D DEFICIENCY, FRACTURES): VITD: 38.88 ng/mL (ref 30.00–100.00)

## 2023-02-27 ENCOUNTER — Encounter: Payer: Self-pay | Admitting: Internal Medicine

## 2023-02-27 NOTE — Patient Instructions (Incomplete)
      Blood work was ordered.       Medications changes include :   None    A referral was ordered and someone will call you to schedule an appointment.     No follow-ups on file.

## 2023-02-27 NOTE — Progress Notes (Unsigned)
Subjective:    Patient ID: Michael [name], male    DOB: 08-13-1932, 89 y.o.   MRN: [id number]     HPI Michael [name] is here for follow up of his chronic medical problems.   Had labs done.   ?dexa  Medications and allergies reviewed with patient and updated if appropriate.  Current Outpatient Medications on File Prior to Visit  Medication Sig Dispense Refill   acetaminophen (TYLENOL) 500 MG tablet Take 500-1,000 mg by mouth every 8 (eight) hours as needed for mild pain or headache.      apixaban (ELIQUIS) 5 MG TABS tablet Take 1 tablet (5 mg total) by mouth 2 (two) times daily. 60 tablet 5   Ascorbic Acid (VITAMIN C) 1000 MG tablet Take 1,000 mg by mouth daily with breakfast.      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      B-D 3CC LUER-LOK SYR 25GX1" 25G X 1" 3 ML MISC USE MONTHLY FOR B-12 INJECTIONS 12 each 0   brimonidine (ALPHAGAN) 0.2 % ophthalmic solution Place 1 drop into the left eye in the morning and at bedtime. Instill 1 drop into the left eye twice a day- 6:45 AM and 5:50 PM     budesonide (ENTOCORT EC) 3 MG 24 hr capsule AS NEEDED FOR FLARETake 3 capsules (9 mg total) by mouth daily, THEN 2 capsules (6 mg total) daily, THEN 1 capsule (3 mg total) daily. AS NEEDED FOR FLARE.     calcium carbonate (CALCIUM 600) 600 MG TABS tablet Take 1 tablet (600 mg total) by mouth daily. 30 tablet    Cholecalciferol (VITAMIN D3) 50 MCG (2000 UT) TABS Take 2,000 Units by mouth daily with breakfast.     cyanocobalamin (VITAMIN B12) 1000 MCG/ML injection INJECT INTO THE MUSCLE ONCE MONTHLY 10 mL 0   desmopressin (DDAVP) 0.1 MG tablet Take 100 mcg by mouth daily.     dorzolamide-timolol (COSOPT) 22.3-6.8 MG/ML ophthalmic solution Place 1 drop into the left eye 2 (two) times daily.     latanoprost (XALATAN) 0.005 % ophthalmic solution Place 1 drop into the left eye at bedtime.      loperamide (IMODIUM A-D) 2 MG tablet Take 2 mg by mouth daily as needed for diarrhea or loose stools.     metoprolol succinate (TOPROL-XL) 25 MG 24 hr tablet TAKE HALF TABLET BY MOUTH DAILY 45 tablet 3   Netarsudil Dimesylate 0.02 % SOLN Place 1 drop into the left eye at bedtime.     omeprazole (PRILOSEC OTC) 20 MG tablet Take 20 mg by mouth daily before breakfast.     tamsulosin (FLOMAX) 0.4 MG CAPS capsule Take 0.4 mg by mouth at bedtime.     No current facility-administered medications on file prior to visit.     Review of Systems     Objective:  There were no vitals filed for this visit. BP Readings from Last 3 Encounters:  01/16/23 120/66  11/28/22 114/70  11/16/22 132/64   Wt Readings from Last 3 Encounters:  01/16/23 178 lb 12.8 oz (81.1 kg)  11/28/22 175 lb (79.4 kg)  11/16/22 177 lb (80.3 kg)   There is no height or weight on file to calculate BMI.    Physical Exam     Lab Results  Component Value Date   WBC 6.0 02/21/2023   HGB 10.9 (L) 02/21/2023   HCT 32.3 (L) 02/21/2023  PLT 151.0 02/21/2023   GLUCOSE 103 (H) 02/21/2023   CHOL 82 02/21/2023   TRIG 92.0 02/21/2023   HDL 31.40 (L) 02/21/2023   LDLDIRECT 60.6 09/11/2019   LDLCALC 32 02/21/2023   ALT 7 02/21/2023   AST 13 02/21/2023   NA 133 (L) 02/21/2023   K 4.4 02/21/2023   CL 98 02/21/2023   CREATININE 0.96 02/21/2023   BUN 13 02/21/2023   CO2 29 02/21/2023   TSH 2.50 02/21/2023   INR 1.4 (H) 10/26/2021   HGBA1C 5.6 01/14/2021     Assessment & Plan:    See Problem List for Assessment and Plan of chronic medical problems.

## 2023-02-28 ENCOUNTER — Ambulatory Visit: Payer: Medicare Other | Admitting: Internal Medicine

## 2023-02-28 VITALS — BP 116/74 | HR 70 | Temp 97.9°F | Ht 71.0 in | Wt 178.0 lb

## 2023-02-28 DIAGNOSIS — I480 Paroxysmal atrial fibrillation: Secondary | ICD-10-CM

## 2023-02-28 DIAGNOSIS — Z8673 Personal history of transient ischemic attack (TIA), and cerebral infarction without residual deficits: Secondary | ICD-10-CM | POA: Diagnosis not present

## 2023-02-28 DIAGNOSIS — M85851 Other specified disorders of bone density and structure, right thigh: Secondary | ICD-10-CM

## 2023-02-28 DIAGNOSIS — M85852 Other specified disorders of bone density and structure, left thigh: Secondary | ICD-10-CM | POA: Diagnosis not present

## 2023-02-28 DIAGNOSIS — I10 Essential (primary) hypertension: Secondary | ICD-10-CM

## 2023-02-28 DIAGNOSIS — D649 Anemia, unspecified: Secondary | ICD-10-CM

## 2023-02-28 DIAGNOSIS — K50012 Crohn's disease of small intestine with intestinal obstruction: Secondary | ICD-10-CM | POA: Diagnosis not present

## 2023-02-28 NOTE — Assessment & Plan Note (Signed)
History of TIA Taking Eliquis 5 mg twice daily-continue BP well controlled LDL has been at goal without statin needs

## 2023-02-28 NOTE — Assessment & Plan Note (Signed)
Chronic Following with cardiology In sinus rhythm here today He is on Eliquis 5 mg twice daily and metoprolol XL 12.5 mg daily-he is taking his own medication and his wife states that he is very good about taking it

## 2023-02-28 NOTE — Assessment & Plan Note (Signed)
Chronic Likely anemia of chronic disease B12, iron levels normal, folate level has been normal Anemia stable asymptomatic Will monitor

## 2023-02-28 NOTE — Assessment & Plan Note (Signed)
Chronic dexa due - ordered Discussed fall prevention continue regular exercise - working on leg strength

## 2023-02-28 NOTE — Assessment & Plan Note (Signed)
Chronic Blood pressure very well-controlled Cmp reviewed Continue metoprolol XL 12.5 mg daily

## 2023-02-28 NOTE — Assessment & Plan Note (Signed)
Chronic Controlled at this time-denies any symptoms No flare since his last visit Not on medication Management per Dr. Adela Lank

## 2023-03-07 ENCOUNTER — Ambulatory Visit (INDEPENDENT_AMBULATORY_CARE_PROVIDER_SITE_OTHER)
Admission: RE | Admit: 2023-03-07 | Discharge: 2023-03-07 | Disposition: A | Discharge status: Home / Self Care | Payer: Medicare Other | Source: Ambulatory Visit | Attending: Internal Medicine | Admitting: Internal Medicine

## 2023-03-07 DIAGNOSIS — M85852 Other specified disorders of bone density and structure, left thigh: Secondary | ICD-10-CM | POA: Diagnosis not present

## 2023-03-07 DIAGNOSIS — M85851 Other specified disorders of bone density and structure, right thigh: Secondary | ICD-10-CM

## 2023-03-19 NOTE — Patient Instructions (Incomplete)
 Medications changes include :   fosamax 70 mg once a week     Alendronate Solution  - Fosamax  What is this medication? ALENDRONATE (a LEN droe nate) treats osteoporosis. It works by Interior and spatial designer stronger and less likely to break (fracture). It belongs to a group of medications called bisphosphonates. This medicine may be used for other purposes; ask your health care provider or pharmacist if you have questions. COMMON BRAND NAME(S): Fosamax What should I tell my care team before I take this medication? They need to know if you have any of these conditions: Bleeding disorder Cancer Dental disease Difficulty swallowing Infection (fever, chills, cough, sore throat, pain or trouble passing urine) Kidney disease Low levels of calcium or other minerals in the blood Low red blood cell counts Receiving steroids like dexamethasone or prednisone Stomach or intestine problems Trouble sitting or standing for 30 minutes An unusual or allergic reaction to alendronate, other medications, foods, dyes or preservatives Pregnant or trying to get pregnant Breast-feeding How should I use this medication? Take this medication by mouth with a full glass of water. Take it as directed on the prescription label at the same time every day. Use a specially marked oral syringe, spoon, or dropper to measure each dose. Ask your pharmacist if you do not have one. Household spoons are not accurate. Take the dose right after waking up. Do not eat or drink anything before taking it. Do not take it with any other drink except water. After taking it, do not eat breakfast, drink, or take any other medications or vitamins for at least 30 minutes. Sit or stand up for at least 30 minutes after you take it. Do not lie down. Keep taking it unless your care team tells you to stop. A special MedGuide will be given to you by the pharmacist with each prescription and refill. Be sure to read this information carefully  each time. Talk to your care team about the use of this medication in children. Special care may be needed. Overdosage: If you think you have taken too much of this medicine contact a poison control center or emergency room at once. NOTE: This medicine is only for you. Do not share this medicine with others. What if I miss a dose? If you take your medication once a day, skip it. Take your next dose at the scheduled time the next morning. Do not take two doses on the same day. If you take your medication once a week, take the missed dose on the morning after you remember. Do not take two doses on the same day. What may interact with this medication? Aluminum hydroxide Antacids Aspirin Calcium supplements Iron supplements Magnesium supplements Medications for inflammation like ibuprofen, naproxen, and others Vitamins with minerals This list may not describe all possible interactions. Give your health care provider a list of all the medicines, herbs, non-prescription drugs, or dietary supplements you use. Also tell them if you smoke, drink alcohol, or use illegal drugs. Some items may interact with your medicine. What should I watch for while using this medication? Visit your care team for regular checks on your progress. It may be some time before you see the benefit from this medication. Some people who take this medication have severe bone, joint, or muscle pain. This medication may also increase your risk for jaw problems or a broken thigh bone. Tell your care team right away if you have severe pain in your jaw, bones, joints,  or muscles. Tell you care team if you have any pain that does not go away or that gets worse. Tell your dentist and dental surgeon that you are taking this medication. You should not have major dental surgery while on this medication. See your dentist to have a dental exam and fix any dental problems before starting this medication. Take good care of your teeth while on  this medication. Make sure you see your dentist for regular follow-up appointments. You should make sure you get enough calcium and vitamin D while you are taking this medication. Discuss the foods you eat and the vitamins you take with your care team. You may need blood work done while you are taking this medication. What side effects may I notice from receiving this medication? Side effects that you should report to your care team as soon as possible: Allergic reactions--skin rash, itching, hives, swelling of the face, lips, tongue, or throat Low calcium level--muscle pain or cramps, confusion, tingling, or numbness in the hands or feet Osteonecrosis of the jaw--pain, swelling, or redness in the mouth, numbness of the jaw, poor healing after dental work, unusual discharge from the mouth, visible bones in the mouth Pain or trouble swallowing Severe bone, joint, or muscle pain Stomach bleeding--bloody or black, tar-like stools, vomiting blood or brown material that looks like coffee grounds Side effects that usually do not require medical attention (report to your care team if they continue or are bothersome): Constipation Diarrhea Nausea Stomach pain This list may not describe all possible side effects. Call your doctor for medical advice about side effects. You may report side effects to FDA at 1-800-FDA-1088. Where should I keep my medication? Keep out of the reach of children and pets. Store at room temperature between 20 and 25 degrees C (68 and 77 degrees F). Do not freeze. Throw away any unused medication after the expiration date. NOTE: This sheet is a summary. It may not cover all possible information. If you have questions about this medicine, talk to your doctor, pharmacist, or health care provider.  2024 Elsevier/Gold Standard (2019-12-26 00:00:00)

## 2023-03-19 NOTE — Progress Notes (Unsigned)
 Subjective:    Patient ID: Michael [name], male    DOB: 09-10-1932, 89 y.o.   MRN: [id number]      HPI Michael [name] is here for No chief complaint on file.    Narrative & Impression  Date of study: 03/07/2023 Exam: DUAL X-RAY ABSORPTIOMETRY (DXA) FOR BONE MINERAL DENSITY (BMD) Instrument: [employer] Requesting Provider: PCP Indication: follow up for low BMD Comparison: 2021 Clinical data: Pt is a 89 y.o. male without previous history of fracture.   Results:   Lumbar spine L1-L4(L2) Femoral neck (FN) 33% distal radius  T-score +1.4 RFN: -1.3 LFN: -1.7 n/a  Change in BMD from previous DXA test (%) Down 2.9%* Down 8.3%* n/a  (*) statistically significant    FRAX 10-year fracture risk calculator: 10.0 % for any major fracture and 4.9 % for hip fracture.   Last vitamin D Lab Results  Component Value Date   VD25OH 38.88 02/21/2023       Medications and allergies reviewed with patient and updated if appropriate.  Current Outpatient Medications on File Prior to Visit  Medication Sig Dispense Refill   acetaminophen (TYLENOL) 500 MG tablet Take 500-1,000 mg by mouth every 8 (eight) hours as needed for mild pain or headache.      apixaban (ELIQUIS) 5 MG TABS tablet Take 1 tablet (5 mg total) by mouth 2 (two) times daily. 60 tablet 5   Ascorbic Acid (VITAMIN C) 1000 MG tablet Take 1,000 mg by mouth daily with breakfast.      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      B-D 3CC LUER-LOK SYR 25GX1" 25G X 1" 3 ML MISC USE MONTHLY FOR B-12 INJECTIONS 12 each 0   brimonidine (ALPHAGAN) 0.2 % ophthalmic solution Place 1 drop into the left eye in the morning and at bedtime. Instill 1 drop into the left eye twice a day- 6:45 AM and 5:50 PM     budesonide (ENTOCORT EC) 3 MG 24 hr capsule AS NEEDED FOR FLARETake 3 capsules (9 mg total) by mouth daily, THEN 2 capsules (6 mg total)  daily, THEN 1 capsule (3 mg total) daily. AS NEEDED FOR FLARE.     calcium carbonate (CALCIUM 600) 600 MG TABS tablet Take 1 tablet (600 mg total) by mouth daily. 30 tablet    Cholecalciferol (VITAMIN D3) 50 MCG (2000 UT) TABS Take 2,000 Units by mouth daily with breakfast.     cyanocobalamin (VITAMIN B12) 1000 MCG/ML injection INJECT INTO THE MUSCLE ONCE MONTHLY 10 mL 0   desmopressin (DDAVP) 0.1 MG tablet Take 100 mcg by mouth daily.     dorzolamide-timolol (COSOPT) 22.3-6.8 MG/ML ophthalmic solution Place 1 drop into the left eye 2 (two) times daily.     latanoprost (XALATAN) 0.005 % ophthalmic solution Place 1 drop into the left eye at bedtime.     loperamide (IMODIUM A-D) 2 MG tablet Take 2 mg by mouth daily as needed for diarrhea or loose stools.     metoprolol succinate (TOPROL-XL) 25 MG 24 hr tablet TAKE HALF TABLET BY MOUTH DAILY 45 tablet 3   Netarsudil Dimesylate 0.02 % SOLN Place 1 drop into the left eye at bedtime.     omeprazole (PRILOSEC OTC) 20 MG tablet Take 20 mg by mouth daily before breakfast.     tamsulosin (FLOMAX) 0.4 MG CAPS capsule Take 0.4 mg by mouth at bedtime.  No current facility-administered medications on file prior to visit.    Review of Systems     Objective:  There were no vitals filed for this visit. BP Readings from Last 3 Encounters:  02/28/23 116/74  01/16/23 120/66  11/28/22 114/70   Wt Readings from Last 3 Encounters:  02/28/23 178 lb (80.7 kg)  01/16/23 178 lb 12.8 oz (81.1 kg)  11/28/22 175 lb (79.4 kg)   There is no height or weight on file to calculate BMI.    Physical Exam         Assessment & Plan:    See Problem List for Assessment and Plan of chronic medical problems.

## 2023-03-20 ENCOUNTER — Encounter: Payer: Self-pay | Admitting: Internal Medicine

## 2023-03-20 ENCOUNTER — Ambulatory Visit (INDEPENDENT_AMBULATORY_CARE_PROVIDER_SITE_OTHER): Admitting: Internal Medicine

## 2023-03-20 VITALS — BP 114/60 | HR 70 | Temp 97.6°F | Ht 71.0 in | Wt 176.0 lb

## 2023-03-20 DIAGNOSIS — M85852 Other specified disorders of bone density and structure, left thigh: Secondary | ICD-10-CM | POA: Diagnosis not present

## 2023-03-20 DIAGNOSIS — M85851 Other specified disorders of bone density and structure, right thigh: Secondary | ICD-10-CM | POA: Diagnosis not present

## 2023-03-20 MED ORDER — ALENDRONATE SODIUM 70 MG PO TABS: 70.0000 mg | ORAL_TABLET | ORAL | 3 refills | Status: DC

## 2023-03-20 NOTE — Assessment & Plan Note (Signed)
 Chronic Reviewed most recent bone density and discussed treatment options given elevated FRAX and frequent falls at home Recommended Fosamax and she agrees to take it.  Discussed possible side effects Fosamax 70 mg weekly-advised to stay upright after taking with a full glass of water for at least 30 minutes Continue regular exercise Continue calcium and vitamin D supplementation Recheck DEXA after 2 years

## 2023-04-11 NOTE — Patient Instructions (Incomplete)
     Your ear were cleaned out today     Medications changes include :   None

## 2023-04-11 NOTE — Progress Notes (Unsigned)
 Subjective:    Patient ID: Michael [name], male    DOB: 10-23-1932, 89 y.o.   MRN: [id number]      HPI Michael [name] is here for No chief complaint on file.        Medications and allergies reviewed with patient and updated if appropriate.  Current Outpatient Medications on File Prior to Visit  Medication Sig Dispense Refill   acetaminophen (TYLENOL) 500 MG tablet Take 500-1,000 mg by mouth every 8 (eight) hours as needed for mild pain or headache.      alendronate (FOSAMAX) 70 MG tablet Take 1 tablet (70 mg total) by mouth every 7 (seven) days. Take with a full glass of water on an empty stomach. 12 tablet 3   apixaban (ELIQUIS) 5 MG TABS tablet Take 1 tablet (5 mg total) by mouth 2 (two) times daily. 60 tablet 5   Ascorbic Acid (VITAMIN C) 1000 MG tablet Take 1,000 mg by mouth daily with breakfast.      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      B-D 3CC LUER-LOK SYR 25GX1" 25G X 1" 3 ML MISC USE MONTHLY FOR B-12 INJECTIONS 12 each 0   brimonidine (ALPHAGAN) 0.2 % ophthalmic solution Place 1 drop into the left eye in the morning and at bedtime. Instill 1 drop into the left eye twice a day- 6:45 AM and 5:50 PM     budesonide (ENTOCORT EC) 3 MG 24 hr capsule AS NEEDED FOR FLARETake 3 capsules (9 mg total) by mouth daily, THEN 2 capsules (6 mg total) daily, THEN 1 capsule (3 mg total) daily. AS NEEDED FOR FLARE.     calcium carbonate (CALCIUM 600) 600 MG TABS tablet Take 1 tablet (600 mg total) by mouth daily. 30 tablet    Cholecalciferol (VITAMIN D3) 50 MCG (2000 UT) TABS Take 2,000 Units by mouth daily with breakfast.     cyanocobalamin (VITAMIN B12) 1000 MCG/ML injection INJECT INTO THE MUSCLE ONCE MONTHLY 10 mL 0   desmopressin (DDAVP) 0.1 MG tablet Take 100 mcg by mouth daily.     dorzolamide-timolol (COSOPT) 22.3-6.8 MG/ML ophthalmic solution Place 1 drop into the left eye  2 (two) times daily.     latanoprost (XALATAN) 0.005 % ophthalmic solution Place 1 drop into the left eye at bedtime.     loperamide (IMODIUM A-D) 2 MG tablet Take 2 mg by mouth daily as needed for diarrhea or loose stools.     metoprolol succinate (TOPROL-XL) 25 MG 24 hr tablet TAKE HALF TABLET BY MOUTH DAILY 45 tablet 3   Netarsudil Dimesylate 0.02 % SOLN Place 1 drop into the left eye at bedtime.     omeprazole (PRILOSEC OTC) 20 MG tablet Take 20 mg by mouth daily before breakfast.     tamsulosin (FLOMAX) 0.4 MG CAPS capsule Take 0.4 mg by mouth at bedtime.     No current facility-administered medications on file prior to visit.    Review of Systems     Objective:  There were no vitals filed for this visit. BP Readings from Last 3 Encounters:  03/20/23 114/60  02/28/23 116/74  01/16/23 120/66   Wt Readings from Last 3 Encounters:  03/20/23 176 lb (79.8 kg)  02/28/23 178 lb (80.7 kg)  01/16/23 178 lb 12.8 oz (81.1 kg)   There is no height or weight on file to calculate BMI.    Physical Exam  Assessment & Plan:    See Problem List for Assessment and Plan of chronic medical problems.

## 2023-04-12 ENCOUNTER — Ambulatory Visit (INDEPENDENT_AMBULATORY_CARE_PROVIDER_SITE_OTHER): Admitting: Internal Medicine

## 2023-04-12 ENCOUNTER — Encounter: Payer: Self-pay | Admitting: Internal Medicine

## 2023-04-12 VITALS — BP 116/74 | HR 77 | Temp 98.0°F | Ht 71.0 in | Wt 178.0 lb

## 2023-04-12 DIAGNOSIS — H6123 Impacted cerumen, bilateral: Secondary | ICD-10-CM

## 2023-04-12 DIAGNOSIS — H9193 Unspecified hearing loss, bilateral: Secondary | ICD-10-CM

## 2023-04-12 NOTE — Progress Notes (Signed)
 PRE-PROCEDURE EXAM: Bilateral TM's cannot be visualized due to total occlusion/impaction of the ear canal.  PROCEDURE INDICATION: remove wax to visualize ear drum & relieve discomfort  CONSENT:  Verbal   PROCEDURE NOTE:  RIGHT EAR:  The CMA irrigated the ear canal with warm water to remove the wax.  LEFT EAR:  The CMA irrigated the ear canal with warm water to remove the wax.  POST- PROCEDURE EXAM: Right TM successfully visualized and found to have no erythema.  Right ear canal with scant remaining cerumen.  Left ear however was unsuccessful with removing wax due to patient discomfort. Left canal still contained quite a bit of cerumen even after irrigating a few times.   Patient tolerated the procedure well for his right ear but not his left. He did have relief for right ear after the procedure but not his left.

## 2023-04-12 NOTE — Assessment & Plan Note (Signed)
 Acute on chronic Hearing is worse than usual.  He wears hearing aids, but has bilateral impacted cerumen, which is causing worsening of his current hearing loss and tinnitus in his right ear Bilateral ears cleaned out today-hearing still decreased ( at baseline), but improved See procedure note by CMA

## 2023-04-12 NOTE — Assessment & Plan Note (Signed)
Acute bilateral impacted cerumen Resulting in hearing loss-no pain or discomfort CMA successfully removed wax from both ears-see procedure note

## 2023-06-22 ENCOUNTER — Ambulatory Visit

## 2023-06-22 VITALS — Ht 70.5 in | Wt 178.0 lb

## 2023-06-22 DIAGNOSIS — Z0000 Encounter for general adult medical examination without abnormal findings: Secondary | ICD-10-CM | POA: Diagnosis not present

## 2023-06-22 NOTE — Patient Instructions (Signed)
 Mr. Michael [name] , Thank you for taking time out of your busy schedule to complete your Annual Wellness Visit with me. I enjoyed our conversation and look forward to speaking with you again next year. I, as well as your care team,  appreciate your ongoing commitment to your health goals. Please review the following plan we discussed and let me know if I can assist you in the future. Your Game plan/ To Do List    Follow up Visits: Next Medicare AWV with our clinical staff: 06/25/2024   Have you seen your provider in the last 6 months (3 months if uncontrolled diabetes)? Yes Next Office Visit with your provider: 08/28/2023  Clinician Recommendations:  Aim for 30 minutes of exercise or brisk walking, 6-8 glasses of water , and 5 servings of fruits and vegetables each day.       This is a list of the screening recommended for you and due dates:  Health Maintenance  Topic Date Due   Zoster (Shingles) Vaccine (2 of 2) 01/23/2017   COVID-19 Vaccine (4 - 2024-25 season) 09/11/2022   Flu Shot  08/11/2023   Medicare Annual Wellness Visit  06/21/2024   DEXA scan (bone density measurement)  03/06/2025   DTaP/Tdap/Td vaccine (4 - Td or Tdap) 06/26/2030   Pneumococcal Vaccine for age over 46  Completed   HPV Vaccine  Aged Out   Meningitis B Vaccine  Aged Out    Advanced directives: (Copy Requested) Please bring a copy of your health care power of attorney and living will to the office to be added to your chart at your convenience. You can mail to [facility] [address] [place], [place] [address] or email to [email or web address] Advance Care Planning is important because it:  [x]  Makes sure you receive the medical care that is consistent with your values, goals, and preferences  [x]  It provides guidance to your family and loved ones and reduces their decisional burden about whether or not they are making the right decisions based on your wishes.  Follow the link provided in  your after visit summary or read over the paperwork we have mailed to you to help you started getting your Advance Directives in place. If you need assistance in completing these, please reach out to us  so that we can help you!

## 2023-06-22 NOTE — Progress Notes (Signed)
 Subjective:   Michael [name] is a 89 y.o. who presents for a Medicare Wellness preventive visit.  As a reminder, Annual Wellness Visits don't include a physical exam, and some assessments may be limited, especially if this visit is performed virtually. We may recommend an in-person follow-up visit with your provider if needed.  Visit Complete: Virtual I connected with  Michael [name] on 06/22/23 by a audio enabled telemedicine application and verified that I am speaking with the correct person using two identifiers.  Patient Location: Home  Provider Location: Office/Clinic  I discussed the limitations of evaluation and management by telemedicine. The patient expressed understanding and agreed to proceed.  Vital Signs: Because this visit was a virtual/telehealth visit, some criteria may be missing or patient reported. Any vitals not documented were not able to be obtained and vitals that have been documented are patient reported.  VideoDeclined- This patient declined Librarian, academic. Therefore the visit was completed with audio only.  Persons Participating in Visit: Patient.  AWV Questionnaire: No: Patient Medicare AWV questionnaire was not completed prior to this visit.  Cardiac Risk Factors include: advanced age (>57men, >53 women);dyslipidemia;hypertension;male gender     Objective:    Today's Vitals   06/22/23 1306  Weight: 178 lb (80.7 kg)  Height: 5' 10.5 (1.791 m)   Body mass index is 25.18 kg/m.     06/22/2023    1:06 PM 10/26/2021    4:36 PM 09/23/2020    8:47 AM 03/19/2020   10:15 AM 08/26/2019    8:34 AM 03/25/2019   11:10 AM 03/10/2019   10:37 AM  Advanced Directives  Does Patient Have a Medical Advance Directive? Yes No No Yes No Yes Yes  Type of Estate agent of [place];Living will     Healthcare Power of [place];Living will Healthcare Power of [place];Living will  Copy of Healthcare Power of Attorney  in Chart? No - copy requested     No - copy requested No - copy requested  Would patient like information on creating a medical advance directive?     No - Patient declined      Current Medications (verified) Outpatient Encounter Medications as of 06/22/2023  Medication Sig   acetaminophen  (TYLENOL ) 500 MG tablet Take 500-1,000 mg by mouth every 8 (eight) hours as needed for mild pain or headache.    alendronate  (FOSAMAX ) 70 MG tablet Take 1 tablet (70 mg total) by mouth every 7 (seven) days. Take with a full glass of water  on an empty stomach.   apixaban  (ELIQUIS ) 5 MG TABS tablet Take 1 tablet (5 mg total) by mouth 2 (two) times daily.   Ascorbic Acid  (VITAMIN C ) 1000 MG tablet Take 1,000 mg by mouth daily with breakfast.    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    B-D 3CC LUER-LOK SYR 25GX1 25G X 1 3 ML MISC USE MONTHLY FOR B-12 INJECTIONS   brimonidine  (ALPHAGAN ) 0.2 % ophthalmic solution Place 1 drop into the left eye in the morning and at bedtime. Instill 1 drop into the left eye twice a day- 6:45 AM and 5:50 PM   budesonide  (ENTOCORT EC ) 3 MG 24 hr capsule AS NEEDED FOR FLARETake 3 capsules (9 mg total) by mouth daily, THEN 2 capsules (6 mg total) daily, THEN 1 capsule (3 mg total) daily. AS NEEDED FOR FLARE.   calcium  carbonate (CALCIUM  600) 600 MG TABS  tablet Take 1 tablet (600 mg total) by mouth daily.   Cholecalciferol  (VITAMIN D3) 50 MCG (2000 UT) TABS Take 2,000 Units by mouth daily with breakfast.   cyanocobalamin  (VITAMIN B12) 1000 MCG/ML injection INJECT 1ML INTO THE MUSCLE ONCE MONTHLY   desmopressin (DDAVP) 0.1 MG tablet Take 100 mcg by mouth daily.   dorzolamide -timolol  (COSOPT ) 22.3-6.8 MG/ML ophthalmic solution Place 1 drop into the left eye 2 (two) times daily.   latanoprost  (XALATAN ) 0.005 % ophthalmic solution Place 1 drop into the left eye at bedtime.   loperamide   (IMODIUM  A-D) 2 MG tablet Take 2 mg by mouth daily as needed for diarrhea or loose stools.   metoprolol  succinate (TOPROL -XL) 25 MG 24 hr tablet TAKE HALF TABLET BY MOUTH DAILY   Netarsudil  Dimesylate 0.02 % SOLN Place 1 drop into the left eye at bedtime.   omeprazole  (PRILOSEC  OTC) 20 MG tablet Take 20 mg by mouth daily before breakfast.   tamsulosin  (FLOMAX ) 0.4 MG CAPS capsule Take 0.4 mg by mouth at bedtime.   No facility-administered encounter medications on file as of 06/22/2023.    Allergies (verified) Patient has no known allergies.   History: Past Medical History:  Diagnosis Date   Adrenal insufficiency (HCC)    Anemia    Atrial fibrillation (HCC)    Avascular necrosis (HCC)    Clavicle fracture 11/02/2017   Colon polyp    Colon polyps    Crohn's disease (HCC)    Glaucoma    Inguinal hernia recurrent bilateral    Low testosterone     Osteopenia    Retinal ischemia    Past Surgical History:  Procedure Laterality Date   ABDOMINAL SURGERY     BIOPSY  03/19/2020   Procedure: BIOPSY;  Surgeon: Ace Holder, MD;  Location: [facility];  Service: Gastroenterology;;   COLONOSCOPY N/A 12/13/2015   Procedure: COLONOSCOPY;  Surgeon: Jolinda Necessary, MD;  Location: [facility];  Service: Endoscopy;  Laterality: N/A;   COLONOSCOPY WITH PROPOFOL  N/A 03/25/2019   Procedure: COLONOSCOPY WITH PROPOFOL ;  Surgeon: Ace Holder, MD;  Location: [facility];  Service: Gastroenterology;  Laterality: N/A;   COLONOSCOPY WITH PROPOFOL  N/A 08/26/2019   Procedure: COLONOSCOPY WITH PROPOFOL ;  Surgeon: Ace Holder, MD;  Location: [facility];  Service: Gastroenterology;  Laterality: N/A;   COLONOSCOPY WITH PROPOFOL  N/A 03/19/2020   Procedure: COLONOSCOPY WITH PROPOFOL ;  Surgeon: Ace Holder, MD;  Location: [facility];  Service: Gastroenterology;  Laterality: N/A;   ENDOSCOPIC MUCOSAL RESECTION N/A 08/26/2019   Procedure: ENDOSCOPIC MUCOSAL RESECTION;  Surgeon:  Ace Holder, MD;  Location: [facility];  Service: Gastroenterology;  Laterality: N/A;   INGUINAL HERNIA REPAIR Bilateral 05/21/2014   Procedure: OPEN BILATERAL INGUINAL HERNIA REPAIRS WITH MESH;  Surgeon: Dareen Ebbing, MD;  Location: [facility];  Service: General;  Laterality: Bilateral;   POLYPECTOMY  03/25/2019   Procedure: POLYPECTOMY;  Surgeon: Ace Holder, MD;  Location: [facility];  Service: Gastroenterology;;   POLYPECTOMY  08/26/2019   Procedure: POLYPECTOMY;  Surgeon: Ace Holder, MD;  Location: [facility];  Service: Gastroenterology;;   POLYPECTOMY  03/19/2020   Procedure: POLYPECTOMY;  Surgeon: Ace Holder, MD;  Location: [facility];  Service: Gastroenterology;;   [place]  08/26/2019   Procedure: Sprayed methylene blue ;  Surgeon: Ace Holder, MD;  Location: [facility];  Service: Gastroenterology;;   SMALL INTESTINE SURGERY  1968, 2001    related to Chrohn's disease   TONSILLECTOMY     Family  History  Problem Relation Age of Onset   Deep vein thrombosis Mother    Transient ischemic attack Mother    Heart disease Father    Cancer Maternal Aunt        unknown type; dx after 52   Cancer Cousin        maternal cousin; unknown type   Cancer Cousin        paternal cousin; unknown type; dx after 50   Stomach cancer Neg Hx    Colon cancer Neg Hx    Esophageal cancer Neg Hx    Pancreatic cancer Neg Hx    Rectal cancer Neg Hx    Colon polyps Neg Hx    Social History   Socioeconomic History   Marital status: Married    Spouse name: Not on file   Number of children: 3   Years of education: Not on file   Highest education level: Not on file  Occupational History   Occupation: Retired  Tobacco Use   Smoking status: Former    Current packs/day: 0.00    Types: Cigarettes    Quit date: 04/01/1976    Years since quitting: 47.2   Smokeless tobacco: Never  Vaping Use   Vaping status: Never Used   Substance and Sexual Activity   Alcohol use: Yes    Alcohol/week: 2.0 standard drinks of alcohol    Types: 2 Glasses of wine per week    Comment: social   Drug use: No   Sexual activity: Not Currently  Other Topics Concern   Not on file  Social History Narrative   Not on file   Social Drivers of Health   Financial Resource Strain: Low Risk  (06/22/2023)   Overall Financial Resource Strain (CARDIA)    Difficulty of Paying Living Expenses: Not hard at all  Food Insecurity: No Food Insecurity (06/22/2023)   Hunger Vital Sign    Worried About Running Out of Food in the Last Year: Never true    Ran Out of Food in the Last Year: Never true  Transportation Needs: No Transportation Needs (06/22/2023)   PRAPARE - Administrator, Civil Service (Medical): No    Lack of Transportation (Non-Medical): No  Physical Activity: Sufficiently Active (06/22/2023)   Exercise Vital Sign    Days of Exercise per Week: 7 days    Minutes of Exercise per Session: 30 min  Stress: No Stress Concern Present (06/22/2023)   [employer] of Occupational Health - Occupational Stress Questionnaire    Feeling of Stress: Not at all  Social Connections: Socially Integrated (06/22/2023)   Social Connection and Isolation Panel    Frequency of Communication with Friends and Family: More than three times a week    Frequency of Social Gatherings with Friends and Family: More than three times a week    Attends Religious Services: More than 4 times per year    Active Member of [employer] or Organizations: Yes    Attends Engineer, structural: More than 4 times per year    Marital Status: Married    Tobacco Counseling Counseling given: No    Clinical Intake:  Pre-visit preparation completed: Yes  Pain : No/denies pain     BMI - recorded: 25.18 Nutritional Status: BMI 25 -29 Overweight Nutritional Risks: None Diabetes: No  Lab Results  Component Value Date   HGBA1C 5.6 01/14/2021    HGBA1C 5.1 09/24/2020   HGBA1C 5.4 12/24/2019     How often do you  need to have someone help you when you read instructions, pamphlets, or other written materials from your doctor or pharmacy?: 1 - Never  Interpreter Needed?: No  Information entered by :: Kandy Orris, CMA   Activities of Daily Living     06/22/2023    1:08 PM  In your present state of health, do you have any difficulty performing the following activities:  Hearing? 0  Vision? 0  Difficulty concentrating or making decisions? 0  Walking or climbing stairs? 0  Dressing or bathing? 0  Doing errands, shopping? 0  Preparing Food and eating ? N  Using the Toilet? N  In the past six months, have you accidently leaked urine? N  Do you have problems with loss of bowel control? N  Managing your Medications? N  Managing your Finances? N  Housekeeping or managing your Housekeeping? N    Patient Care Team: Colene Dauphin, MD as PCP - General (Internal Medicine) Odie Benne, MD as PCP - Cardiology (Cardiology) Sidra Dredge, MD as Consulting Physician (Ophthalmology)  I have updated your Care Teams any recent Medical Services you may have received from other providers in the past year.     Assessment:   This is a routine wellness examination for [facility].  Hearing/Vision screen Hearing Screening - Comments:: Wears hearing aids Vision Screening - Comments:: Wears rx glasses - up to date with routine eye exams with Dr Hope Ly   Goals Addressed               This Visit's Progress     Patient Stated (pt-stated)        Patient stated that he exercises daily       Depression Screen     06/22/2023    1:10 PM 04/12/2023    8:17 AM 03/20/2023    8:46 AM 11/28/2022   11:00 AM 06/02/2022    9:00 AM 04/18/2022    8:58 AM 09/27/2021    2:07 PM  PHQ 2/9 Scores  PHQ - 2 Score 0 0 0 0 1 0 0  PHQ- 9 Score 0   0 1      Fall Risk     06/22/2023    1:13 PM 04/12/2023    8:17 AM 03/20/2023    8:46  AM 11/28/2022   11:00 AM 04/18/2022    8:58 AM  Fall Risk   Falls in the past year? 1 0 0 0 0  Number falls in past yr: 0 0 0 0 0  Comment 1      Injury with Fall? 1 0 0 0 0  Risk for fall due to : History of fall(s);Impaired balance/gait No Fall Risks No Fall Risks No Fall Risks No Fall Risks  Follow up Falls evaluation completed;Falls prevention discussed Falls evaluation completed Falls evaluation completed Falls evaluation completed Falls evaluation completed    MEDICARE RISK AT HOME:  Medicare Risk at Home Any stairs in or around the home?: No If so, are there any without handrails?: No Home free of loose throw rugs in walkways, pet beds, electrical cords, etc?: Yes Adequate lighting in your home to reduce risk of falls?: Yes Life alert?: No Use of a cane, walker or w/c?: No Grab bars in the bathroom?: Yes Shower chair or bench in shower?: Yes Elevated toilet seat or a handicapped toilet?: Yes  TIMED UP AND GO:  Was the test performed?  No  Cognitive Function: 6CIT completed    10/01/2020  9:37 AM 10/11/2019    8:16 AM  MMSE - Mini Mental State Exam  Orientation to time 5 5  Orientation to Place 4 5  Registration 3 3  Attention/ Calculation 5 4  Recall 2 3  Language- name 2 objects 2 2  Language- repeat 0 1  Language- follow 3 step command 3 3  Language- read & follow direction 0 1  Write a sentence 1 1  Copy design 1 1  Total score 26 29      11/22/2019   10:00 AM  Montreal Cognitive Assessment   Visuospatial/ Executive (0/5) 3  Naming (0/3) 3  Attention: Read list of digits (0/2) 2  Attention: Read list of letters (0/1) 1  Attention: Serial 7 subtraction starting at 100 (0/3) 3  Language: Repeat phrase (0/2) 0  Language : Fluency (0/1) 0  Abstraction (0/2) 1  Delayed Recall (0/5) 2  Orientation (0/6) 6  Total 21  Adjusted Score (based on education) 21      06/22/2023    1:14 PM  6CIT Screen  What Year? 0 points  What month? 0 points  What  time? 0 points  Count back from 20 0 points  Months in reverse 0 points  Repeat phrase 0 points  Total Score 0 points    Immunizations Immunization History  Administered Date(s) Administered   Influenza, High Dose Seasonal PF 10/21/2013, 10/16/2014, 10/25/2017, 10/15/2018, 10/14/2021   Influenza-Unspecified 10/26/2016, 10/29/2019, 10/15/2020, 10/26/2022   PFIZER(Purple Top)SARS-COV-2 Vaccination 01/31/2019, 02/21/2019, 10/07/2019   Pneumococcal Conjugate-13 01/10/2014   Pneumococcal Polysaccharide-23 01/11/2003   Tdap 02/12/2010, 08/13/2017, 06/25/2020   Zoster Recombinant(Shingrix) 11/28/2016   Zoster, Live 09/28/2016    Screening Tests Health Maintenance  Topic Date Due   Zoster Vaccines- Shingrix (2 of 2) 01/23/2017   COVID-19 Vaccine (4 - 2024-25 season) 09/11/2022   INFLUENZA VACCINE  08/11/2023   Medicare Annual Wellness (AWV)  06/21/2024   DEXA SCAN  03/06/2025   DTaP/Tdap/Td (4 - Td or Tdap) 06/26/2030   Pneumococcal Vaccine: 50+ Years  Completed   HPV VACCINES  Aged Out   Meningococcal B Vaccine  Aged Out    Health Maintenance  Health Maintenance Due  Topic Date Due   Zoster Vaccines- Shingrix (2 of 2) 01/23/2017   COVID-19 Vaccine (4 - 2024-25 season) 09/11/2022   Health Maintenance Items Addressed:06/22/2023   Additional Screening:  Vision Screening: Recommended annual ophthalmology exams for early detection of glaucoma and other disorders of the eye. Would you like a referral to an eye doctor? No  Pt stated seen Dr Hope Ly for an eye exam in 06/2023.    Dental Screening: Recommended annual dental exams for proper oral hygiene  Community Resource Referral / Chronic Care Management: CRR required this visit?  No   CCM required this visit?  No   Plan:    I have personally reviewed and noted the following in the patient's chart:   Medical and social history Use of alcohol, tobacco or illicit drugs  Current medications and supplements including  opioid prescriptions. Patient is not currently taking opioid prescriptions. Functional ability and status Nutritional status Physical activity Advanced directives List of other physicians Hospitalizations, surgeries, and ER visits in previous 12 months Vitals Screenings to include cognitive, depression, and falls Referrals and appointments  In addition, I have reviewed and discussed with patient certain preventive protocols, quality metrics, and best practice recommendations. A written personalized care plan for preventive services as well as general preventive health recommendations were provided  to patient.   Patria Bookbinder, CMA   06/22/2023   After Visit Summary: (Declined) Due to this being a telephonic visit, with patients personalized plan was offered to patient but patient Declined AVS at this time   Notes: Nothing significant to report at this time.

## 2023-08-27 ENCOUNTER — Encounter: Payer: Self-pay | Admitting: Internal Medicine

## 2023-08-27 NOTE — Patient Instructions (Addendum)
   If your pain in the right lower back does not get better let me know.    Blood work was ordered.       Medications changes include :   None      Return in about 6 months (around 02/28/2024) for Physical Exam.

## 2023-08-27 NOTE — Progress Notes (Unsigned)
 Subjective:    Patient ID: Michael [name], male    DOB: 12-Sep-1932, 89 y.o.   MRN: [id number]     HPI Bazil is here for follow up of his chronic medical problems.  Having right lower back pain .   It started 1-2 weeks ago.  When he showers it helps it - the hot water .  This morning it did not - the pain is sticking around.  Pain is worse first thing in the morning.    Moving around helps and the hot water  helps.  No radiating pain.   The heating pad helps temporarily.    Crohn's flare - abdominal pain x 2-3 days - better today.   Goes to gym 5 / week.    Medications and allergies reviewed with patient and updated if appropriate.  Current Outpatient Medications on File Prior to Visit  Medication Sig Dispense Refill   acetaminophen  (TYLENOL ) 500 MG tablet Take 500-1,000 mg by mouth every 8 (eight) hours as needed for mild pain or headache.      alendronate  (FOSAMAX ) 70 MG tablet Take 1 tablet (70 mg total) by mouth every 7 (seven) days. Take with a full glass of water  on an empty stomach. 12 tablet 3   apixaban  (ELIQUIS ) 5 MG TABS tablet Take 1 tablet (5 mg total) by mouth 2 (two) times daily. 60 tablet 5   Ascorbic Acid  (VITAMIN C ) 1000 MG tablet Take 1,000 mg by mouth daily with breakfast.      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      B-D 3CC LUER-LOK SYR 25GX1 25G X 1 3 ML MISC USE MONTHLY FOR B-12 INJECTIONS 12 each 0   brimonidine  (ALPHAGAN ) 0.2 % ophthalmic solution Place 1 drop into the left eye in the morning and at bedtime. Instill 1 drop into the left eye twice a day- 6:45 AM and 5:50 PM     budesonide  (ENTOCORT EC ) 3 MG 24 hr capsule AS NEEDED FOR FLARETake 3 capsules (9 mg total) by mouth daily, THEN 2 capsules (6 mg total) daily, THEN 1 capsule (3 mg total) daily. AS NEEDED FOR FLARE.     calcium  carbonate (CALCIUM  600) 600 MG TABS tablet Take 1 tablet (600 mg  total) by mouth daily. 30 tablet    Cholecalciferol  (VITAMIN D3) 50 MCG (2000 UT) TABS Take 2,000 Units by mouth daily with breakfast.     cyanocobalamin  (VITAMIN B12) 1000 MCG/ML injection INJECT 1ML INTO THE MUSCLE ONCE MONTHLY 10 mL 0   desmopressin (DDAVP) 0.1 MG tablet Take 100 mcg by mouth daily.     dorzolamide -timolol  (COSOPT ) 22.3-6.8 MG/ML ophthalmic solution Place 1 drop into the left eye 2 (two) times daily.     latanoprost  (XALATAN ) 0.005 % ophthalmic solution Place 1 drop into the left eye at bedtime.     loperamide  (IMODIUM  A-D) 2 MG tablet Take 2 mg by mouth daily as needed for diarrhea or loose stools.     metoprolol  succinate (TOPROL -XL) 25 MG 24 hr tablet TAKE HALF TABLET BY MOUTH DAILY 45 tablet 3   Netarsudil  Dimesylate 0.02 % SOLN Place 1 drop into the left eye at bedtime.     omeprazole  (PRILOSEC  OTC) 20 MG tablet Take 20 mg by mouth daily before breakfast.     tamsulosin  (FLOMAX ) 0.4 MG CAPS capsule Take 0.4 mg by mouth at bedtime.     No current  facility-administered medications on file prior to visit.     Review of Systems  Constitutional:  Negative for fever.  Respiratory:  Negative for cough, shortness of breath and wheezing.   Cardiovascular:  Negative for chest pain, palpitations and leg swelling.  Gastrointestinal:  Positive for abdominal pain (last 2-3 days). Negative for blood in stool (no melena), constipation and diarrhea.  Neurological:  Negative for light-headedness and headaches.       Objective:   Vitals:   08/28/23 0955  BP: (!) 100/58  Pulse: 77  Temp: 98.1 F (36.7 C)  SpO2: 97%   BP Readings from Last 3 Encounters:  08/28/23 (!) 100/58  04/12/23 116/74  03/20/23 114/60   Wt Readings from Last 3 Encounters:  08/28/23 171 lb (77.6 kg)  06/22/23 178 lb (80.7 kg)  04/12/23 178 lb (80.7 kg)   Body mass index is 24.19 kg/m.    Physical Exam Constitutional:      General: He is not in acute distress.    Appearance: Normal  appearance. He is not ill-appearing.  HENT:     Head: Normocephalic and atraumatic.  Eyes:     Conjunctiva/sclera: Conjunctivae normal.  Cardiovascular:     Rate and Rhythm: Normal rate and regular rhythm.     Heart sounds: Normal heart sounds.  Pulmonary:     Effort: Pulmonary effort is normal. No respiratory distress.     Breath sounds: Normal breath sounds. No wheezing or rales.  Abdominal:     General: There is no distension.     Palpations: Abdomen is soft.     Tenderness: There is abdominal tenderness (right mid quadrant).  Musculoskeletal:     Right lower leg: No edema.     Left lower leg: No edema.  Skin:    General: Skin is warm and dry.     Findings: No rash.  Neurological:     Mental Status: He is alert. Mental status is at baseline.  Psychiatric:        Mood and Affect: Mood normal.        Lab Results  Component Value Date   WBC 6.0 02/21/2023   HGB 10.9 (L) 02/21/2023   HCT 32.3 (L) 02/21/2023   PLT 151.0 02/21/2023   GLUCOSE 103 (H) 02/21/2023   CHOL 82 02/21/2023   TRIG 92.0 02/21/2023   HDL 31.40 (L) 02/21/2023   LDLDIRECT 60.6 09/11/2019   LDLCALC 32 02/21/2023   ALT 7 02/21/2023   AST 13 02/21/2023   NA 133 (L) 02/21/2023   K 4.4 02/21/2023   CL 98 02/21/2023   CREATININE 0.96 02/21/2023   BUN 13 02/21/2023   CO2 29 02/21/2023   TSH 2.50 02/21/2023   INR 1.4 (H) 10/26/2021   HGBA1C 5.6 01/14/2021     Assessment & Plan:    See Problem List for Assessment and Plan of chronic medical problems.

## 2023-08-28 ENCOUNTER — Ambulatory Visit (INDEPENDENT_AMBULATORY_CARE_PROVIDER_SITE_OTHER): Payer: Medicare Other | Admitting: Internal Medicine

## 2023-08-28 VITALS — BP 100/58 | HR 77 | Temp 98.1°F | Ht 70.5 in | Wt 171.0 lb

## 2023-08-28 DIAGNOSIS — M461 Sacroiliitis, not elsewhere classified: Secondary | ICD-10-CM | POA: Diagnosis not present

## 2023-08-28 DIAGNOSIS — Z8673 Personal history of transient ischemic attack (TIA), and cerebral infarction without residual deficits: Secondary | ICD-10-CM

## 2023-08-28 DIAGNOSIS — E559 Vitamin D deficiency, unspecified: Secondary | ICD-10-CM | POA: Diagnosis not present

## 2023-08-28 DIAGNOSIS — M85851 Other specified disorders of bone density and structure, right thigh: Secondary | ICD-10-CM

## 2023-08-28 DIAGNOSIS — M85852 Other specified disorders of bone density and structure, left thigh: Secondary | ICD-10-CM

## 2023-08-28 DIAGNOSIS — K50012 Crohn's disease of small intestine with intestinal obstruction: Secondary | ICD-10-CM | POA: Diagnosis not present

## 2023-08-28 DIAGNOSIS — I10 Essential (primary) hypertension: Secondary | ICD-10-CM | POA: Diagnosis not present

## 2023-08-28 DIAGNOSIS — I480 Paroxysmal atrial fibrillation: Secondary | ICD-10-CM | POA: Diagnosis not present

## 2023-08-28 DIAGNOSIS — R739 Hyperglycemia, unspecified: Secondary | ICD-10-CM

## 2023-08-28 NOTE — Assessment & Plan Note (Signed)
 Chronic Following with cardiology He is on Eliquis  5 mg twice daily and metoprolol  XL 12.5 mg daily CMP, CBC

## 2023-08-28 NOTE — Assessment & Plan Note (Signed)
 Chronic Taking vitamin D daily Check vitamin D level

## 2023-08-28 NOTE — Assessment & Plan Note (Addendum)
 Chronic Blood pressure very well-controlled - a little on the low side - denies lightheadedness so no change in meds Cmp, CBC Continue metoprolol  XL 12.5 mg daily

## 2023-08-28 NOTE — Assessment & Plan Note (Signed)
 Chronic Fosamax  started 03/2023 Tolerating medication well without side effects Continue regular exercise Continue calcium  and vitamin D  supplementation DEXA every 2 years Check vitamin D  level

## 2023-08-28 NOTE — Assessment & Plan Note (Signed)
 Acute Having pain in right lower back-at right SI joint Deferred PT Deferred sports medicine referral at this time Continue heat-use more often Tylenol  as needed Would like to avoid steroids if possible because of osteoporosis, but could consider a few days of steroids if needed Call if not improving

## 2023-08-28 NOTE — Assessment & Plan Note (Signed)
 History of TIA Taking Eliquis  5 mg twice daily-continue BP well controlled LDL has been at goal without statin

## 2023-08-28 NOTE — Assessment & Plan Note (Addendum)
 Chronic Having abdominal pain x past 2-3 days Not on medication-has budesonide  taper to take as needed for flare, but has not taken it -- advised that if his pain is not getting better he needs to take it Management per [name]

## 2023-08-28 NOTE — Assessment & Plan Note (Signed)
 Chronic Lab Results  Component Value Date   HGBA1C 5.6 01/14/2021   Sugars have been in the normal range Check a1c Low sugar / carb diet Stressed regular exercise

## 2023-09-19 NOTE — Progress Notes (Unsigned)
 Subjective:    Patient ID: Michael [name], male    DOB: [birth year], 89 y.o.   MRN: [id number]      HPI [name] is here for No chief complaint on file.        Medications and allergies reviewed with patient and updated if appropriate.  Current Outpatient Medications on File Prior to Visit  Medication Sig Dispense Refill   acetaminophen  (TYLENOL ) 500 MG tablet Take 500-1,000 mg by mouth every 8 (eight) hours as needed for mild pain or headache.      alendronate  (FOSAMAX ) 70 MG tablet Take 1 tablet (70 mg total) by mouth every 7 (seven) days. Take with a full glass of water  on an empty stomach. 12 tablet 3   apixaban  (ELIQUIS ) 5 MG TABS tablet Take 1 tablet (5 mg total) by mouth 2 (two) times daily. 60 tablet 5   Ascorbic Acid  (VITAMIN C ) 1000 MG tablet Take 1,000 mg by mouth daily with breakfast.      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      B-D 3CC LUER-LOK SYR 25GX1 25G X 1 3 ML MISC USE MONTHLY FOR B-12 INJECTIONS 12 each 0   brimonidine  (ALPHAGAN ) 0.2 % ophthalmic solution Place 1 drop into the left eye in the morning and at bedtime. Instill 1 drop into the left eye twice a day- 6:45 AM and 5:50 PM     budesonide  (ENTOCORT EC ) 3 MG 24 hr capsule AS NEEDED FOR FLARETake 3 capsules (9 mg total) by mouth daily, THEN 2 capsules (6 mg total) daily, THEN 1 capsule (3 mg total) daily. AS NEEDED FOR FLARE.     calcium  carbonate (CALCIUM  600) 600 MG TABS tablet Take 1 tablet (600 mg total) by mouth daily. 30 tablet    Cholecalciferol  (VITAMIN D3) 50 MCG (2000 UT) TABS Take 2,000 Units by mouth daily with breakfast.     cyanocobalamin  (VITAMIN B12) 1000 MCG/ML injection INJECT 1ML INTO THE MUSCLE ONCE MONTHLY 10 mL 0   desmopressin  (DDAVP ) 0.1 MG tablet Take 100 mcg by mouth daily.     dorzolamide -timolol  (COSOPT ) 22.3-6.8 MG/ML ophthalmic solution Place 1 drop into the left eye  2 (two) times daily.     latanoprost  (XALATAN ) 0.005 % ophthalmic solution Place 1 drop into the left eye at bedtime.     loperamide  (IMODIUM  A-D) 2 MG tablet Take 2 mg by mouth daily as needed for diarrhea or loose stools.     metoprolol  succinate (TOPROL -XL) 25 MG 24 hr tablet TAKE HALF TABLET BY MOUTH DAILY 45 tablet 3   Netarsudil  Dimesylate 0.02 % SOLN Place 1 drop into the left eye at bedtime.     omeprazole  (PRILOSEC  OTC) 20 MG tablet Take 20 mg by mouth daily before breakfast.     tamsulosin  (FLOMAX ) 0.4 MG CAPS capsule Take 0.4 mg by mouth at bedtime.     No current facility-administered medications on file prior to visit.    Review of Systems     Objective:  There were no vitals filed for this visit. BP Readings from Last 3 Encounters:  08/28/23 (!) 100/58  04/12/23 116/74  03/20/23 114/60   Wt Readings from Last 3 Encounters:  08/28/23 171 lb (77.6 kg)  06/22/23 178 lb (80.7 kg)  04/12/23 178 lb (80.7 kg)   There is no height or weight on file to calculate BMI.    Physical Exam  Assessment & Plan:    See Problem List for Assessment and Plan of chronic medical problems.

## 2023-09-20 ENCOUNTER — Inpatient Hospital Stay (HOSPITAL_COMMUNITY)
Admission: EM | Admit: 2023-09-20 | Discharge: 2023-09-22 | DRG: 194 | Disposition: A | Discharge status: Home Health | Attending: Family Medicine | Admitting: Family Medicine

## 2023-09-20 ENCOUNTER — Encounter: Payer: Self-pay | Admitting: Internal Medicine

## 2023-09-20 ENCOUNTER — Emergency Department (HOSPITAL_COMMUNITY)

## 2023-09-20 ENCOUNTER — Ambulatory Visit (INDEPENDENT_AMBULATORY_CARE_PROVIDER_SITE_OTHER): Admitting: Internal Medicine

## 2023-09-20 ENCOUNTER — Encounter (HOSPITAL_COMMUNITY): Payer: Self-pay

## 2023-09-20 ENCOUNTER — Other Ambulatory Visit: Payer: Self-pay

## 2023-09-20 VITALS — BP 114/68 | HR 58 | Temp 103.0°F | Ht 70.5 in

## 2023-09-20 DIAGNOSIS — Z1152 Encounter for screening for COVID-19: Secondary | ICD-10-CM

## 2023-09-20 DIAGNOSIS — R350 Frequency of micturition: Secondary | ICD-10-CM

## 2023-09-20 DIAGNOSIS — H409 Unspecified glaucoma: Secondary | ICD-10-CM | POA: Diagnosis present

## 2023-09-20 DIAGNOSIS — R296 Repeated falls: Secondary | ICD-10-CM | POA: Diagnosis present

## 2023-09-20 DIAGNOSIS — D696 Thrombocytopenia, unspecified: Secondary | ICD-10-CM | POA: Diagnosis present

## 2023-09-20 DIAGNOSIS — J189 Pneumonia, unspecified organism: Principal | ICD-10-CM | POA: Diagnosis present

## 2023-09-20 DIAGNOSIS — Z87891 Personal history of nicotine dependence: Secondary | ICD-10-CM

## 2023-09-20 DIAGNOSIS — G3184 Mild cognitive impairment, so stated: Secondary | ICD-10-CM

## 2023-09-20 DIAGNOSIS — M8580 Other specified disorders of bone density and structure, unspecified site: Secondary | ICD-10-CM | POA: Diagnosis present

## 2023-09-20 DIAGNOSIS — E538 Deficiency of other specified B group vitamins: Secondary | ICD-10-CM

## 2023-09-20 DIAGNOSIS — Z860100 Personal history of colon polyps, unspecified: Secondary | ICD-10-CM

## 2023-09-20 DIAGNOSIS — Z7983 Long term (current) use of bisphosphonates: Secondary | ICD-10-CM

## 2023-09-20 DIAGNOSIS — Z7901 Long term (current) use of anticoagulants: Secondary | ICD-10-CM

## 2023-09-20 DIAGNOSIS — Z79899 Other long term (current) drug therapy: Secondary | ICD-10-CM

## 2023-09-20 DIAGNOSIS — E876 Hypokalemia: Secondary | ICD-10-CM | POA: Diagnosis present

## 2023-09-20 DIAGNOSIS — R410 Disorientation, unspecified: Secondary | ICD-10-CM

## 2023-09-20 DIAGNOSIS — I480 Paroxysmal atrial fibrillation: Secondary | ICD-10-CM

## 2023-09-20 DIAGNOSIS — D649 Anemia, unspecified: Secondary | ICD-10-CM | POA: Diagnosis present

## 2023-09-20 DIAGNOSIS — E860 Dehydration: Secondary | ICD-10-CM | POA: Diagnosis present

## 2023-09-20 DIAGNOSIS — E2740 Unspecified adrenocortical insufficiency: Secondary | ICD-10-CM | POA: Diagnosis present

## 2023-09-20 DIAGNOSIS — K5090 Crohn's disease, unspecified, without complications: Secondary | ICD-10-CM | POA: Diagnosis present

## 2023-09-20 DIAGNOSIS — I10 Essential (primary) hypertension: Secondary | ICD-10-CM

## 2023-09-20 DIAGNOSIS — Z8249 Family history of ischemic heart disease and other diseases of the circulatory system: Secondary | ICD-10-CM

## 2023-09-20 DIAGNOSIS — R531 Weakness: Secondary | ICD-10-CM | POA: Diagnosis not present

## 2023-09-20 DIAGNOSIS — R739 Hyperglycemia, unspecified: Secondary | ICD-10-CM

## 2023-09-20 LAB — URINALYSIS, W/ REFLEX TO CULTURE (INFECTION SUSPECTED)
Bacteria, UA: NONE SEEN
Bilirubin Urine: NEGATIVE
Glucose, UA: NEGATIVE mg/dL
Ketones, ur: NEGATIVE mg/dL
Leukocytes,Ua: NEGATIVE
Nitrite: NEGATIVE
Protein, ur: 100 mg/dL — AB
Specific Gravity, Urine: 1.019 (ref 1.005–1.030)
pH: 5 (ref 5.0–8.0)

## 2023-09-20 LAB — CBC WITH DIFFERENTIAL/PLATELET
Abs Immature Granulocytes: 0.17 K/uL — ABNORMAL HIGH (ref 0.00–0.07)
Basophils Absolute: 0 K/uL (ref 0.0–0.1)
Basophils Relative: 0 %
Eosinophils Absolute: 0 K/uL (ref 0.0–0.5)
Eosinophils Relative: 0 %
HCT: 27.8 % — ABNORMAL LOW (ref 39.0–52.0)
Hemoglobin: 8.9 g/dL — ABNORMAL LOW (ref 13.0–17.0)
Immature Granulocytes: 2 %
Lymphocytes Relative: 6 %
Lymphs Abs: 0.6 K/uL — ABNORMAL LOW (ref 0.7–4.0)
MCH: 28.5 pg (ref 26.0–34.0)
MCHC: 32 g/dL (ref 30.0–36.0)
MCV: 89.1 fL (ref 80.0–100.0)
Monocytes Absolute: 1 K/uL (ref 0.1–1.0)
Monocytes Relative: 11 %
Neutro Abs: 7.6 K/uL (ref 1.7–7.7)
Neutrophils Relative %: 81 %
Platelets: 111 K/uL — ABNORMAL LOW (ref 150–400)
RBC: 3.12 MIL/uL — ABNORMAL LOW (ref 4.22–5.81)
RDW: 21.1 % — ABNORMAL HIGH (ref 11.5–15.5)
Smear Review: NORMAL
WBC: 9.5 K/uL (ref 4.0–10.5)
nRBC: 0 % (ref 0.0–0.2)

## 2023-09-20 LAB — COMPREHENSIVE METABOLIC PANEL WITH GFR
ALT: 17 U/L (ref 0–44)
AST: 46 U/L — ABNORMAL HIGH (ref 15–41)
Albumin: 3.3 g/dL — ABNORMAL LOW (ref 3.5–5.0)
Alkaline Phosphatase: 58 U/L (ref 38–126)
Anion gap: 13 (ref 5–15)
BUN: 17 mg/dL (ref 8–23)
CO2: 24 mmol/L (ref 22–32)
Calcium: 8.3 mg/dL — ABNORMAL LOW (ref 8.9–10.3)
Chloride: 96 mmol/L — ABNORMAL LOW (ref 98–111)
Creatinine, Ser: 1.06 mg/dL (ref 0.61–1.24)
GFR, Estimated: >60 mL/min (ref >60)
Glucose, Bld: 105 mg/dL — ABNORMAL HIGH (ref 70–99)
Potassium: 3.1 mmol/L — ABNORMAL LOW (ref 3.5–5.1)
Sodium: 133 mmol/L — ABNORMAL LOW (ref 135–145)
Total Bilirubin: 3.4 mg/dL — ABNORMAL HIGH (ref 0.0–1.2)
Total Protein: 6.4 g/dL — ABNORMAL LOW (ref 6.5–8.1)

## 2023-09-20 LAB — RESP PANEL BY RT-PCR (RSV, FLU A&B, COVID)  RVPGX2
Influenza A by PCR: NEGATIVE
Influenza B by PCR: NEGATIVE
Resp Syncytial Virus by PCR: NEGATIVE
SARS Coronavirus 2 by RT PCR: NEGATIVE

## 2023-09-20 LAB — PROTIME-INR
INR: 1.8 — ABNORMAL HIGH (ref 0.8–1.2)
Prothrombin Time: 21.5 s — ABNORMAL HIGH (ref 11.4–15.2)

## 2023-09-20 LAB — I-STAT CG4 LACTIC ACID, ED: Lactic Acid, Venous: 1.4 mmol/L (ref 0.5–1.9)

## 2023-09-20 MED ORDER — SODIUM CHLORIDE 0.9 % IV SOLN
2.0000 g | Freq: Once | INTRAVENOUS | Status: AC
  Administered 2023-09-20: 2 g via INTRAVENOUS
  Filled 2023-09-20: qty 20

## 2023-09-20 MED ORDER — ACETAMINOPHEN 650 MG RE SUPP: 650.0000 mg | Freq: Four times a day (QID) | RECTAL | Status: DC | PRN

## 2023-09-20 MED ORDER — ONDANSETRON HCL 4 MG/2ML IJ SOLN: 4.0000 mg | Freq: Four times a day (QID) | INTRAMUSCULAR | Status: DC | PRN

## 2023-09-20 MED ORDER — AZITHROMYCIN 500 MG IV SOLR
500.0000 mg | Freq: Once | INTRAVENOUS | Status: AC
  Administered 2023-09-20: 500 mg via INTRAVENOUS
  Filled 2023-09-20: qty 5

## 2023-09-20 MED ORDER — SODIUM CHLORIDE 0.9 % IV SOLN
2.0000 g | INTRAVENOUS | Status: DC
  Administered 2023-09-21: 2 g via INTRAVENOUS
  Filled 2023-09-20: qty 20

## 2023-09-20 MED ORDER — OMEPRAZOLE MAGNESIUM 20 MG PO TBEC: 20.0000 mg | DELAYED_RELEASE_TABLET | Freq: Every day | ORAL | Status: DC

## 2023-09-20 MED ORDER — APIXABAN 5 MG PO TABS
5.0000 mg | ORAL_TABLET | Freq: Two times a day (BID) | ORAL | Status: DC
  Administered 2023-09-20 – 2023-09-22 (×4): 5 mg via ORAL
  Filled 2023-09-20 (×4): qty 1

## 2023-09-20 MED ORDER — PANTOPRAZOLE SODIUM 40 MG PO TBEC
40.0000 mg | DELAYED_RELEASE_TABLET | Freq: Every day | ORAL | Status: DC
  Administered 2023-09-21 – 2023-09-22 (×2): 40 mg via ORAL
  Filled 2023-09-20 (×2): qty 1

## 2023-09-20 MED ORDER — AZITHROMYCIN 250 MG PO TABS
500.0000 mg | ORAL_TABLET | Freq: Every day | ORAL | Status: DC
  Administered 2023-09-21 – 2023-09-22 (×2): 500 mg via ORAL
  Filled 2023-09-20 (×2): qty 2

## 2023-09-20 MED ORDER — AZITHROMYCIN 500 MG IV SOLR: 500.0000 mg | INTRAVENOUS | Status: DC

## 2023-09-20 MED ORDER — ONDANSETRON HCL 4 MG PO TABS: 4.0000 mg | ORAL_TABLET | Freq: Four times a day (QID) | ORAL | Status: DC | PRN

## 2023-09-20 MED ORDER — DESMOPRESSIN ACETATE 0.1 MG PO TABS
100.0000 ug | ORAL_TABLET | Freq: Every day | ORAL | Status: DC
  Administered 2023-09-21 – 2023-09-22 (×2): 100 ug via ORAL
  Filled 2023-09-20 (×2): qty 1

## 2023-09-20 MED ORDER — ACETAMINOPHEN 325 MG PO TABS: 650.0000 mg | ORAL_TABLET | Freq: Four times a day (QID) | ORAL | Status: DC | PRN

## 2023-09-20 MED ORDER — NETARSUDIL DIMESYLATE 0.02 % OP SOLN: 1.0000 [drp] | Freq: Every day | OPHTHALMIC | Status: DC

## 2023-09-20 MED ORDER — TAMSULOSIN HCL 0.4 MG PO CAPS
0.4000 mg | ORAL_CAPSULE | Freq: Every day | ORAL | Status: DC
  Administered 2023-09-20 – 2023-09-21 (×2): 0.4 mg via ORAL
  Filled 2023-09-20 (×2): qty 1

## 2023-09-20 MED ORDER — LATANOPROST 0.005 % OP SOLN
1.0000 [drp] | Freq: Every day | OPHTHALMIC | Status: DC
  Administered 2023-09-20 – 2023-09-21 (×2): 1 [drp] via OPHTHALMIC
  Filled 2023-09-20: qty 2.5

## 2023-09-20 MED ORDER — METOPROLOL SUCCINATE ER 25 MG PO TB24
12.5000 mg | ORAL_TABLET | Freq: Every day | ORAL | Status: DC
  Administered 2023-09-20 – 2023-09-21 (×2): 12.5 mg via ORAL
  Filled 2023-09-20 (×3): qty 1

## 2023-09-20 MED ORDER — ACETAMINOPHEN 500 MG PO TABS
1000.0000 mg | ORAL_TABLET | Freq: Once | ORAL | Status: AC
  Administered 2023-09-20: 1000 mg via ORAL
  Filled 2023-09-20: qty 2

## 2023-09-20 MED ORDER — LACTATED RINGERS IV BOLUS
1000.0000 mL | Freq: Once | INTRAVENOUS | Status: AC
  Administered 2023-09-20: 1000 mL via INTRAVENOUS

## 2023-09-20 MED ORDER — DORZOLAMIDE HCL-TIMOLOL MAL 2-0.5 % OP SOLN
1.0000 [drp] | Freq: Two times a day (BID) | OPHTHALMIC | Status: DC
  Administered 2023-09-20 – 2023-09-22 (×4): 1 [drp] via OPHTHALMIC
  Filled 2023-09-20: qty 10

## 2023-09-20 MED ORDER — BRIMONIDINE TARTRATE 0.2 % OP SOLN
1.0000 [drp] | Freq: Two times a day (BID) | OPHTHALMIC | Status: DC
  Administered 2023-09-20 – 2023-09-22 (×4): 1 [drp] via OPHTHALMIC
  Filled 2023-09-20: qty 5

## 2023-09-20 MED ORDER — LACTATED RINGERS IV SOLN
INTRAVENOUS | Status: DC
Start: 2023-09-20 — End: 2023-09-21

## 2023-09-20 NOTE — Patient Instructions (Addendum)
    ED evaluation --  concern for sepsis secondary to UTI   2 days of increased confusion - A & O x 0, generalized weakness and urinary frequency.   Temperature today here at 103.  Concern for sepsis.    Stroke less likely.  ? Compliance with medication.

## 2023-09-20 NOTE — ED Triage Notes (Signed)
 Patient sent from primary care due to increased confusion since Monday. Increased urinary frequency, they are thinking possible UTI. Denies vomiting. Has not been able to ambulate without a walker which is unlike patient. Alert to self only.

## 2023-09-20 NOTE — H&P (Signed)
 History and Physical    Patient: Michael [name] FMW:[id number] DOB: [birth year] DOA: 09/20/2023 DOS: the patient was seen and examined on 09/20/2023 PCP: [name], MD  Patient coming from: Home  Chief Complaint:  Chief Complaint  Patient presents with   Altered Mental Status   HPI:  89 year old male lives at home with his wife, relatively good health for age, presented with several day history of multiple falls at home, generalized weakness, confusion.  Initial evaluation revealing for fever and possible pneumonia.  Referred for observation.  History obtained from family and patient.  Patient is normally ambulatory without assistance, he and his wife take care of each other, however over the last several days his condition changed, he became confused, developed generalized weakness and was no longer able to ambulate by himself.  He was sent to the emergency department today by PCP for concern for infection.  Currently the patient feels well.  No specific aggravating or alleviating factors noted.  Review of Systems: No fever, changes to his vision, sore throat, rash, new muscle aches, chest pain, shortness of breath, dysuria, bleeding, nausea, vomiting, abdominal pain  Past Medical History:  Diagnosis Date   Adrenal insufficiency (HCC)    Anemia    Atrial fibrillation (HCC)    Avascular necrosis (HCC)    Clavicle fracture 11/02/2017   Colon polyp    Colon polyps    Crohn's disease (HCC)    Glaucoma    Inguinal hernia recurrent bilateral    Low testosterone     Osteopenia    Retinal ischemia    Past Surgical History:  Procedure Laterality Date   ABDOMINAL SURGERY     BIOPSY  03/19/2020   Procedure: BIOPSY;  Surgeon: Leigh Elspeth SQUIBB, MD;  Location: [facility];  Service: Gastroenterology;;   COLONOSCOPY N/A 12/13/2015   Procedure: COLONOSCOPY;  Surgeon: Lynwood Bohr, MD;  Location: [facility];  Service: Endoscopy;  Laterality: N/A;   COLONOSCOPY WITH PROPOFOL  N/A  03/25/2019   Procedure: COLONOSCOPY WITH PROPOFOL ;  Surgeon: Leigh Elspeth SQUIBB, MD;  Location: [facility];  Service: Gastroenterology;  Laterality: N/A;   COLONOSCOPY WITH PROPOFOL  N/A 08/26/2019   Procedure: COLONOSCOPY WITH PROPOFOL ;  Surgeon: Leigh Elspeth SQUIBB, MD;  Location: [facility];  Service: Gastroenterology;  Laterality: N/A;   COLONOSCOPY WITH PROPOFOL  N/A 03/19/2020   Procedure: COLONOSCOPY WITH PROPOFOL ;  Surgeon: Leigh Elspeth SQUIBB, MD;  Location: [facility];  Service: Gastroenterology;  Laterality: N/A;   ENDOSCOPIC MUCOSAL RESECTION N/A 08/26/2019   Procedure: ENDOSCOPIC MUCOSAL RESECTION;  Surgeon: Leigh Elspeth SQUIBB, MD;  Location: [facility];  Service: Gastroenterology;  Laterality: N/A;   INGUINAL HERNIA REPAIR Bilateral 05/21/2014   Procedure: OPEN BILATERAL INGUINAL HERNIA REPAIRS WITH MESH;  Surgeon: Donnice Lima, MD;  Location: [facility];  Service: General;  Laterality: Bilateral;   POLYPECTOMY  03/25/2019   Procedure: POLYPECTOMY;  Surgeon: Leigh Elspeth SQUIBB, MD;  Location: [facility];  Service: Gastroenterology;;   POLYPECTOMY  08/26/2019   Procedure: POLYPECTOMY;  Surgeon: Leigh Elspeth SQUIBB, MD;  Location: [facility];  Service: Gastroenterology;;   POLYPECTOMY  03/19/2020   Procedure: POLYPECTOMY;  Surgeon: Leigh Elspeth SQUIBB, MD;  Location: [facility];  Service: Gastroenterology;;   WALDEMAR  08/26/2019   Procedure: Sprayed methylene blue ;  Surgeon: Leigh Elspeth SQUIBB, MD;  Location: [facility];  Service: Gastroenterology;;   SMALL INTESTINE SURGERY  1968, 2001    related to Chrohn's disease   TONSILLECTOMY     Social History:  reports that he  quit smoking about 47 years ago. His smoking use included cigarettes. He has never used smokeless tobacco. He reports current alcohol use of about 2.0 standard drinks of alcohol per week. He reports that he does not use drugs.  No Known Allergies  Family History  Problem  Relation Age of Onset   Deep vein thrombosis Mother    Transient ischemic attack Mother    Heart disease Father    Cancer Maternal Aunt        unknown type; dx after 49   Cancer Cousin        maternal cousin; unknown type   Cancer Cousin        paternal cousin; unknown type; dx after 63   Stomach cancer Neg Hx    Colon cancer Neg Hx    Esophageal cancer Neg Hx    Pancreatic cancer Neg Hx    Rectal cancer Neg Hx    Colon polyps Neg Hx     Prior to Admission medications   Medication Sig Start Date End Date Taking? Authorizing Provider  acetaminophen  (TYLENOL ) 500 MG tablet Take 500-1,000 mg by mouth every 8 (eight) hours as needed for mild pain or headache.     [provider]  alendronate  (FOSAMAX ) 70 MG tablet Take 1 tablet (70 mg total) by mouth every 7 (seven) days. Take with a full glass of water  on an empty stomach. 03/20/23   [name], MD  apixaban  (ELIQUIS ) 5 MG TABS tablet Take 1 tablet (5 mg total) by mouth 2 (two) times daily. 09/06/21   Verlin Lonni BIRCH, MD  Ascorbic Acid  (VITAMIN C ) 1000 MG tablet Take 1,000 mg by mouth daily with breakfast.     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  10/03/17   [provider]  B-D 3CC LUER-LOK SYR 25GX1 25G X 1 3 ML MISC USE MONTHLY FOR B-12 INJECTIONS 01/13/23   [name], MD  brimonidine  (ALPHAGAN ) 0.2 % ophthalmic solution Place 1 drop into the left eye in the morning and at bedtime. Instill 1 drop into the left eye twice a day- 6:45 AM and 5:50 PM 11/20/15   [provider]  budesonide  (ENTOCORT EC ) 3 MG 24 hr capsule AS NEEDED FOR FLARETake 3 capsules (9 mg total) by mouth daily, THEN 2 capsules (6 mg total) daily, THEN 1 capsule (3 mg total) daily. AS NEEDED FOR FLARE. 11/28/22   [name], MD  calcium  carbonate (CALCIUM  600) 600 MG TABS tablet Take 1 tablet  (600 mg total) by mouth daily. 09/29/20   [name], MD  Cholecalciferol  (VITAMIN D3) 50 MCG (2000 UT) TABS Take 2,000 Units by mouth daily with breakfast.    [provider]  cyanocobalamin  (VITAMIN B12) 1000 MCG/ML injection INJECT 1ML INTO THE MUSCLE ONCE MONTHLY 11/14/22   [name], MD  desmopressin  (DDAVP ) 0.1 MG tablet Take 100 mcg by mouth daily. 04/30/22   [provider]  dorzolamide -timolol  (COSOPT ) 22.3-6.8 MG/ML ophthalmic solution Place 1 drop into the left eye 2 (two) times daily. 11/05/15   [provider]  latanoprost  (XALATAN ) 0.005 % ophthalmic solution Place 1 drop into the left eye at bedtime. 09/12/17   [provider]  loperamide  (IMODIUM  A-D) 2 MG tablet Take 2 mg by mouth daily as needed for diarrhea or loose stools.    [provider]  metoprolol  succinate (TOPROL -XL) 25  MG 24 hr tablet TAKE HALF TABLET BY MOUTH DAILY 02/16/23   Verlin Lonni BIRCH, MD  Netarsudil  Dimesylate 0.02 % SOLN Place 1 drop into the left eye at bedtime.    [provider]  omeprazole  (PRILOSEC  OTC) 20 MG tablet Take 20 mg by mouth daily before breakfast.    [provider]  tamsulosin  (FLOMAX ) 0.4 MG CAPS capsule Take 0.4 mg by mouth at bedtime. 12/20/19   [provider]    Physical Exam: Vitals:   09/20/23 1240 09/20/23 1321 09/20/23 1442 09/20/23 1734  BP: (!) 146/76  135/67 120/73  Pulse: (!) 113  92 89  Resp: 20  (!) 26 20  Temp: (!) 100.9 F (38.3 C)  98.7 F (37.1 C) (!) 97.5 F (36.4 C)  TempSrc: Oral  Oral Oral  SpO2: 96% 100% 95% 95%  Weight: 77.1 kg     Height: 5' 10 (1.778 m)      Physical Exam Vitals reviewed.  Constitutional:      General: He is not in acute distress.    Appearance: He is not ill-appearing or toxic-appearing.  Cardiovascular:     Rate and Rhythm: Normal rate and regular rhythm.     Heart sounds: No murmur heard. Pulmonary:     Effort: Pulmonary effort is normal. No  respiratory distress.     Breath sounds: No wheezing, rhonchi or rales.  Musculoskeletal:     Right lower leg: No edema.     Left lower leg: No edema.  Neurological:     Mental Status: He is alert.  Psychiatric:        Mood and Affect: Mood normal.        Behavior: Behavior normal.     Data Reviewed: Potassium 3.1, remainder BMP unremarkable, AST modestly elevated, total bilirubin 3.4 probably from dehydration Lactic acid within normal limits Hemoglobin 8.9, likely stable, INR 1.8 on anticoagulant Flu, RSV and COVID-negative Urinalysis unrevealing Chest x-ray left base pneumonia  Assessment and Plan: Community-acquired pneumonia Generalized weakness Mild confusion without encephalopathy Empiric antibiotics, monitor respiratory status  Hypokalemia Dehydration Elevated total bilirubin Replete potassium.  IV fluids.  Check CMP in AM.  Chronic normocytic anemia Thrombocytopenia acute Chronic anemia probably not far from baseline, thrombocytopenia is new, likely related to infection  Atrial fibrillation Stable.  Continue anticoagulation.   Advance Care Planning: Full per patient  Consults: none  Family Communication: family at bedside  Severity of Illness: The appropriate patient status for this patient is OBSERVATION. Observation status is judged to be reasonable and necessary in order to provide the required intensity of service to ensure the patient's safety. The patient's presenting symptoms, physical exam findings, and initial radiographic and laboratory data in the context of their medical condition is felt to place them at decreased risk for further clinical deterioration. Furthermore, it is anticipated that the patient will be medically stable for discharge from the hospital within 2 midnights of admission.   Author: Toribio Door, MD 09/20/2023 8:04 PM  For on call review [email or web address].

## 2023-09-20 NOTE — ED Provider Notes (Signed)
 [place] [facility]ERGENCY DEPARTMENT AT [facility] Provider Note   CSN: [id number] Arrival date & time: 09/20/23  1235     Patient presents with: Altered Mental Status   Michael [name] is a 89 y.o. male.   89 yo M with a chief complaints of not acting right.  Going on for about 3 to 4 days now.  Has had a couple falls without any specific injury from the fall.  He has been urinating frequently.  No obvious areas of pain.  More confused than normal.  Typically is able to ambulate and then requires a walker which is not normal for him and over the past day has not been able to stand up on his own.  No cough congestion or fever.  No abdominal pain.  They took him to his primary care office today and sent here for evaluation.   Altered Mental Status      Prior to Admission medications   Medication Sig Start Date End Date Taking? Authorizing Provider  acetaminophen  (TYLENOL ) 500 MG tablet Take 500-1,000 mg by mouth every 8 (eight) hours as needed for mild pain or headache.     [provider]  alendronate  (FOSAMAX ) 70 MG tablet Take 1 tablet (70 mg total) by mouth every 7 (seven) days. Take with a full glass of water  on an empty stomach. 03/20/23   [name], MD  apixaban  (ELIQUIS ) 5 MG TABS tablet Take 1 tablet (5 mg total) by mouth 2 (two) times daily. 09/06/21   Verlin Lonni BIRCH, MD  Ascorbic Acid  (VITAMIN C ) 1000 MG tablet Take 1,000 mg by mouth daily with breakfast.     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  10/03/17   [provider]  B-D 3CC LUER-LOK SYR 25GX1 25G X 1 3 ML MISC USE MONTHLY FOR B-12 INJECTIONS 01/13/23   [name], MD  brimonidine  (ALPHAGAN ) 0.2 % ophthalmic solution Place 1 drop into the left eye in the morning and at bedtime. Instill 1 drop into the left eye twice a day- 6:45 AM and 5:50 PM  11/20/15   [provider]  budesonide  (ENTOCORT EC ) 3 MG 24 hr capsule AS NEEDED FOR FLARETake 3 capsules (9 mg total) by mouth daily, THEN 2 capsules (6 mg total) daily, THEN 1 capsule (3 mg total) daily. AS NEEDED FOR FLARE. 11/28/22   [name], MD  calcium  carbonate (CALCIUM  600) 600 MG TABS tablet Take 1 tablet (600 mg total) by mouth daily. 09/29/20   [name], MD  Cholecalciferol  (VITAMIN D3) 50 MCG (2000 UT) TABS Take 2,000 Units by mouth daily with breakfast.    [provider]  cyanocobalamin  (VITAMIN B12) 1000 MCG/ML injection INJECT 1ML INTO THE MUSCLE ONCE MONTHLY 11/14/22   [name], MD  desmopressin  (DDAVP ) 0.1 MG tablet Take 100 mcg by mouth daily. 04/30/22   [provider]  dorzolamide -timolol  (COSOPT ) 22.3-6.8 MG/ML ophthalmic solution Place 1 drop into the left eye 2 (two) times daily. 11/05/15   [provider]  latanoprost  (XALATAN ) 0.005 % ophthalmic solution Place 1 drop into the left eye at bedtime. 09/12/17   [provider]  loperamide  (IMODIUM  A-D) 2 MG tablet Take 2 mg by mouth daily as needed for diarrhea or loose stools.    [provider]  metoprolol  succinate (TOPROL -XL) 25 MG 24 hr tablet TAKE HALF TABLET  BY MOUTH DAILY 02/16/23   Verlin Lonni BIRCH, MD  Netarsudil  Dimesylate 0.02 % SOLN Place 1 drop into the left eye at bedtime.    [provider]  omeprazole  (PRILOSEC  OTC) 20 MG tablet Take 20 mg by mouth daily before breakfast.    [provider]  tamsulosin  (FLOMAX ) 0.4 MG CAPS capsule Take 0.4 mg by mouth at bedtime. 12/20/19   [provider]    Allergies: Patient has no known allergies.    Review of Systems  Updated Vital Signs BP 135/67 (BP Location: Left Arm)   Pulse 92   Temp 98.7 F (37.1 C) (Oral)   Resp (!) 26   Ht 5' 10 (1.778 m)   Wt 77.1 kg   SpO2 95%   BMI 24.39 kg/m   Physical Exam Vitals and nursing note reviewed.  Constitutional:       Appearance: He is well-developed.  HENT:     Head: Normocephalic and atraumatic.  Eyes:     Pupils: Pupils are equal, round, and reactive to light.  Neck:     Vascular: No JVD.  Cardiovascular:     Rate and Rhythm: Normal rate and regular rhythm.     Heart sounds: No murmur heard.    No friction rub. No gallop.  Pulmonary:     Effort: No respiratory distress.     Breath sounds: No wheezing.  Abdominal:     General: There is no distension.     Tenderness: There is no abdominal tenderness. There is no guarding or rebound.  Musculoskeletal:        General: Normal range of motion.     Cervical back: Normal range of motion and neck supple.  Skin:    Coloration: Skin is not pale.     Findings: No rash.  Neurological:     Mental Status: He is alert.     Comments: Confused response  Psychiatric:        Behavior: Behavior normal.     (all labs ordered are listed, but only abnormal results are displayed) Labs Reviewed  COMPREHENSIVE METABOLIC PANEL WITH GFR - Abnormal; Notable for the following components:      Result Value   Sodium 133 (*)    Potassium 3.1 (*)    Chloride 96 (*)    Glucose, Bld 105 (*)    Calcium  8.3 (*)    Total Protein 6.4 (*)    Albumin 3.3 (*)    AST 46 (*)    Total Bilirubin 3.4 (*)    All other components within normal limits  CBC WITH DIFFERENTIAL/PLATELET - Abnormal; Notable for the following components:   RBC 3.12 (*)    Hemoglobin 8.9 (*)    HCT 27.8 (*)    RDW 21.1 (*)    Platelets 111 (*)    Lymphs Abs 0.6 (*)    Abs Immature Granulocytes 0.17 (*)    All other components within normal limits  PROTIME-INR - Abnormal; Notable for the following components:   Prothrombin Time 21.5 (*)    INR 1.8 (*)    All other components within normal limits  RESP PANEL BY RT-PCR (RSV, FLU A&B, COVID)  RVPGX2  CULTURE, BLOOD (ROUTINE X 2)  CULTURE, BLOOD (ROUTINE X 2)  URINALYSIS, W/ REFLEX TO CULTURE (INFECTION SUSPECTED)  I-STAT CG4 LACTIC ACID, ED   I-STAT CG4 LACTIC ACID, ED    EKG: EKG Interpretation Date/Time:  Wednesday September 20 2023 12:42:18 EDT Ventricular Rate:  104 PR Interval:  178 QRS Duration:  136 QT Interval:  335 QTC Calculation: 441 R Axis:   114  Text Interpretation: Sinus tachycardia Multiple premature complexes, vent & supraven Right bundle branch block Borderline ST elevation, lateral leads Since last tracing rate faster Otherwise no significant change Confirmed by Emil Share [phone]) on 09/20/2023 1:22:43 PM  Radiology: [facility] Knee Complete 4 Views Left Result Date: 09/20/2023 CLINICAL DATA:  Left knee pain after fall. EXAM: LEFT KNEE - COMPLETE 4+ VIEW COMPARISON:  January 14, 2021. FINDINGS: No evidence of fracture, dislocation, or joint effusion. Moderate narrowing of medial joint space is noted. Soft tissues are unremarkable. IMPRESSION: Moderate degenerative joint disease is noted medially. No acute abnormality seen. Electronically Signed   By: Lynwood Landy Raddle M.D.   On: 09/20/2023 14:04   DG Chest Port 1 View Result Date: 09/20/2023 CLINICAL DATA:  Questionable sepsis. EXAM: PORTABLE CHEST 1 VIEW COMPARISON:  October 27, 2021 FINDINGS: The heart size and mediastinal contours are within normal limits. Low lung volumes are noted. Mild atelectasis and/or infiltrate is seen within the left lung base. No pleural effusion or pneumothorax is identified. The visualized skeletal structures are unremarkable. IMPRESSION: Low lung volumes with mild left basilar atelectasis and/or infiltrate. Electronically Signed   By: Suzen Dials M.D.   On: 09/20/2023 13:42     Procedures   Medications Ordered in the ED  cefTRIAXone  (ROCEPHIN ) 2 g in sodium chloride  0.9 % 100 mL IVPB (has no administration in time range)  azithromycin  (ZITHROMAX ) 500 mg in sodium chloride  0.9 % 250 mL IVPB (has no administration in time range)  lactated ringers  bolus 1,000 mL (1,000 mLs Intravenous New Bag/Given 09/20/23 1316)  acetaminophen   (TYLENOL ) tablet 1,000 mg (1,000 mg Oral Given 09/20/23 1331)                                    Medical Decision Making Amount and/or Complexity of Data Reviewed Labs: ordered. Radiology: ordered.  Risk OTC drugs. Decision regarding hospitalization.   89 yo M with a chief complaints of altered mental status and fatigue.  Going on for about 3 days now.  Has had a couple falls since this started.  Denies obvious injury from fall.  Has had worsening weakness and had difficulty even standing today.  He was able to get into see his family doctor today and there is some concern for a urinary tract infection with urinary frequency and was sent here for evaluation.  Will obtain a laboratory evaluation chest x-ray UA.    Lactate normal.  Hyponatremia hypochloremia total bilirubin elevated I think most consistent with dehydration.  Hemoglobin down 2 g without known etiology.  No reported bleeding by patient or family.  Patient feeling a bit better after Tylenol  and IV fluids.  Chest x-ray with concern for left lower pneumonia.  Family says he has been coughing though they do not appreciate much more than normal.  Will start on antibiotics.  Will discuss with medicine for admission  The patients results and plan were reviewed and discussed.   Any x-rays performed were independently reviewed by myself.   Differential diagnosis were considered with the presenting HPI.  Medications  cefTRIAXone  (ROCEPHIN ) 2 g in sodium chloride  0.9 % 100 mL IVPB (has no administration in time range)  azithromycin  (ZITHROMAX ) 500 mg in sodium chloride  0.9 % 250 mL IVPB (has no administration in time range)  lactated ringers  bolus 1,000  mL (1,000 mLs Intravenous New Bag/Given 09/20/23 1316)  acetaminophen  (TYLENOL ) tablet 1,000 mg (1,000 mg Oral Given 09/20/23 1331)    Vitals:   09/20/23 1240 09/20/23 1321 09/20/23 1442  BP: (!) 146/76  135/67  Pulse: (!) 113  92  Resp: 20  (!) 26  Temp: (!) 100.9 F  (38.3 C)  98.7 F (37.1 C)  TempSrc: Oral  Oral  SpO2: 96% 100% 95%  Weight: 77.1 kg    Height: 5' 10 (1.778 m)      Final diagnoses:  Pneumonia of left lower lobe due to infectious organism  Confusion    Admission/ observation were discussed with the admitting physician, patient and/or family and they are comfortable with the plan.       Final diagnoses:  Pneumonia of left lower lobe due to infectious organism  Confusion    ED Discharge Orders     None          Emil Share, DO 09/20/23 1501

## 2023-09-20 NOTE — Hospital Course (Signed)
 He has a history of TIAs, and his family has not noted any signs of stroke-they deny any focal weakness, difficulty speaking, swallowing.  He has not had a temperature until here today, it is 103F. He has been urinating frequently without pain or discomfort, and there is no unusual appearance or smell to the urine.    His cognitive function has declined over the past couple of days and probably even over the last month, raising concerns about his ability to manage his medications properly.  His family is unsure if he is taking all of his medications daily as prescribed.

## 2023-09-21 DIAGNOSIS — J189 Pneumonia, unspecified organism: Secondary | ICD-10-CM | POA: Diagnosis not present

## 2023-09-21 DIAGNOSIS — D696 Thrombocytopenia, unspecified: Secondary | ICD-10-CM | POA: Diagnosis not present

## 2023-09-21 DIAGNOSIS — I480 Paroxysmal atrial fibrillation: Secondary | ICD-10-CM | POA: Diagnosis not present

## 2023-09-21 LAB — COMPREHENSIVE METABOLIC PANEL WITH GFR
ALT: 14 U/L (ref 0–44)
AST: 35 U/L (ref 15–41)
Albumin: 2.6 g/dL — ABNORMAL LOW (ref 3.5–5.0)
Alkaline Phosphatase: 81 U/L (ref 38–126)
Anion gap: 13 (ref 5–15)
BUN: 14 mg/dL (ref 8–23)
CO2: 21 mmol/L — ABNORMAL LOW (ref 22–32)
Calcium: 8 mg/dL — ABNORMAL LOW (ref 8.9–10.3)
Chloride: 101 mmol/L (ref 98–111)
Creatinine, Ser: 0.77 mg/dL (ref 0.61–1.24)
GFR, Estimated: >60 mL/min (ref >60)
Glucose, Bld: 105 mg/dL — ABNORMAL HIGH (ref 70–99)
Potassium: 3.1 mmol/L — ABNORMAL LOW (ref 3.5–5.1)
Sodium: 135 mmol/L (ref 135–145)
Total Bilirubin: 1.3 mg/dL — ABNORMAL HIGH (ref 0.0–1.2)
Total Protein: 5.3 g/dL — ABNORMAL LOW (ref 6.5–8.1)

## 2023-09-21 LAB — CBC
HCT: 23.5 % — ABNORMAL LOW (ref 39.0–52.0)
Hemoglobin: 7.7 g/dL — ABNORMAL LOW (ref 13.0–17.0)
MCH: 29.5 pg (ref 26.0–34.0)
MCHC: 32.8 g/dL (ref 30.0–36.0)
MCV: 90 fL (ref 80.0–100.0)
Platelets: 80 K/uL — ABNORMAL LOW (ref 150–400)
RBC: 2.61 MIL/uL — ABNORMAL LOW (ref 4.22–5.81)
RDW: 21.1 % — ABNORMAL HIGH (ref 11.5–15.5)
WBC: 6.4 K/uL (ref 4.0–10.5)
nRBC: 0 % (ref 0.0–0.2)

## 2023-09-21 LAB — MAGNESIUM: Magnesium: 1.5 mg/dL — ABNORMAL LOW (ref 1.7–2.4)

## 2023-09-21 MED ORDER — CALCIUM CARBONATE ANTACID 500 MG PO CHEW
800.0000 mg | CHEWABLE_TABLET | Freq: Every day | ORAL | Status: DC | PRN
  Filled 2023-09-21: qty 4

## 2023-09-21 MED ORDER — MAGNESIUM SULFATE 2 GM/50ML IV SOLN
2.0000 g | Freq: Once | INTRAVENOUS | Status: AC
Start: 2023-09-21 — End: 2023-09-21
  Administered 2023-09-21: 2 g via INTRAVENOUS
  Filled 2023-09-21: qty 50

## 2023-09-21 MED ORDER — POTASSIUM CHLORIDE CRYS ER 20 MEQ PO TBCR
40.0000 meq | EXTENDED_RELEASE_TABLET | ORAL | Status: AC
  Administered 2023-09-21 (×2): 40 meq via ORAL
  Filled 2023-09-21 (×2): qty 2

## 2023-09-21 MED ORDER — POTASSIUM CHLORIDE CRYS ER 20 MEQ PO TBCR
40.0000 meq | EXTENDED_RELEASE_TABLET | Freq: Once | ORAL | Status: AC
  Administered 2023-09-21: 40 meq via ORAL
  Filled 2023-09-21: qty 2

## 2023-09-21 NOTE — Evaluation (Signed)
 Physical Therapy Evaluation Patient Details Name: Michael [name] MRN: [id number] DOB: 11-Apr-1932 Today's Date: 09/21/2023  History of Present Illness  89 year old male presented with several day history of multiple falls at home, generalized weakness, confusion and admitted 09/20/23 for community acquired pneumonia.  PMHx: Anemia, afib, glaucoma, recurrent bilateral inguinal hernia, osteopenia, Covid  Clinical Impression  Pt admitted with above diagnosis.  Pt currently with functional limitations due to the deficits listed below (see PT Problem List). Pt will benefit from acute skilled PT to increase their independence and safety with mobility to allow discharge.  Pt reports feeling better and very agreeable to ambulate.  Pt ambulated 360 ft in hallway with RW.  Pt reports using RW more recently due to balance.  Pt would benefit from HHPT upon d/c.  Pt's daughter into room end of session and agreeable to HHPT, also requesting to speak with [facility] team if able.         If plan is discharge home, recommend the following: Assistance with cooking/housework;Assist for transportation;Direct supervision/assist for financial management;Direct supervision/assist for medications management;Help with stairs or ramp for entrance;Supervision due to cognitive status   Can travel by private vehicle        Equipment Recommendations None recommended by PT  Recommendations for Other Services       Functional Status Assessment Patient has had a recent decline in their functional status and demonstrates the ability to make significant improvements in function in a reasonable and predictable amount of time.     Precautions / Restrictions Precautions Precautions: Fall      Mobility  Bed Mobility               General bed mobility comments: pt in recliner    Transfers Overall transfer level: Needs assistance Equipment used: Rolling walker (2 wheels) Transfers: Sit to/from Stand Sit to Stand:  Contact guard assist           General transfer comment: cues for hand placement, increased time and effort    Ambulation/Gait Ambulation/Gait assistance: Contact guard assist Gait Distance (Feet): 360 Feet Assistive device: Rolling walker (2 wheels) Gait Pattern/deviations: Step-through pattern, Decreased stride length, Trunk flexed Gait velocity: decr     General Gait Details: verbal cues for posture and RW Positioning, denies symptoms other then generalized weakness  Stairs            Wheelchair Mobility     Tilt Bed    Modified Rankin (Stroke Patients Only)       Balance Overall balance assessment: History of Falls (reports recent slide off bed and had to have son in law assist him off the floor)                                           Pertinent Vitals/Pain Pain Assessment Pain Assessment: No/denies pain    Home Living Family/patient expects to be discharged to:: Private residence Living Arrangements: Spouse/significant other Available Help at Discharge: Family Type of Home: House Home Access: Level entry       Home Layout: One level Home Equipment: Agricultural consultant (2 wheels)      Prior Function Prior Level of Function : Independent/Modified Independent             Mobility Comments: typically does not use AD but has been using RW recently for balance       Extremity/Trunk Assessment  Lower Extremity Assessment Lower Extremity Assessment: Generalized weakness    Cervical / Trunk Assessment Cervical / Trunk Assessment: Kyphotic  Communication   Communication Communication: Impaired Factors Affecting Communication: Hearing impaired    Cognition Arousal: Alert Behavior During Therapy: WFL for tasks assessed/performed   PT - Cognitive impairments: No apparent impairments                         Following commands: Intact       Cueing       General Comments      Exercises      Assessment/Plan    PT Assessment Patient needs continued PT services  PT Problem List Decreased mobility;Decreased balance;Decreased strength;Decreased activity tolerance;Decreased knowledge of use of DME;Decreased safety awareness       PT Treatment Interventions Gait training;DME instruction;Balance training;Functional mobility training;Therapeutic activities;Therapeutic exercise;Patient/family education;Stair training    PT Goals (Current goals can be found in the Care Plan section)  Acute Rehab PT Goals PT Goal Formulation: With patient/family Time For Goal Achievement: 09/28/23 Potential to Achieve Goals: Good    Frequency Min 2X/week     Co-evaluation               AM-PAC PT 6 Clicks Mobility  Outcome Measure Help needed turning from your back to your side while in a flat bed without using bedrails?: A Little Help needed moving from lying on your back to sitting on the side of a flat bed without using bedrails?: A Little Help needed moving to and from a bed to a chair (including a wheelchair)?: A Little Help needed standing up from a chair using your arms (e.g., wheelchair or bedside chair)?: A Little Help needed to walk in hospital room?: A Little Help needed climbing 3-5 steps with a railing? : A Little 6 Click Score: 18    End of Session Equipment Utilized During Treatment: Gait belt Activity Tolerance: Patient tolerated treatment well Patient left: with call bell/phone within reach;in chair;with family/visitor present Nurse Communication: Mobility status PT Visit Diagnosis: Difficulty in walking, not elsewhere classified (R26.2);Muscle weakness (generalized) (M62.81)    Time: 8850-8789 PT Time Calculation (min) (ACUTE ONLY): 21 min   Charges:   PT Evaluation $PT Eval Low Complexity: 1 Low   PT General Charges $$ ACUTE PT VISIT: 1 Visit        Tari PT, DPT Physical Therapist Acute Rehabilitation Services Office: [phone]   Kati L  [name] 09/21/2023, 3:08 PM

## 2023-09-21 NOTE — TOC Initial Note (Signed)
 Transition of Care [facility]) - Initial/Assessment Note    Patient Details  Name: Michael [name] MRN: [id number] Date of Birth: [birth year]  [facility] CM/SW Contact:    NORMAN ASPEN, LCSW Phone Number: 09/21/2023, 1:18 PM  Clinical Narrative:                  Alerted by PT that pt and family in the room and requesting TOC visit to review dc needs.  Met with pt, spouse and daughter, Michael [name] to review concerns.  Pt very pleasant and confirms he would like daughter to provide information.  Daughter reports that pt and spouse continue to live in their own home, however, both have been declining in their overall mobility and cognition.  (Of note, pt's spouse has cognitive difficulties and speech impairments due to glioblastoma.) Daughter reports that pt is followed at the [facility] in [place] and they have, in the past, made inquiries about potential in home aide coverage through [facility].  Daughter asking CSW to assist with starting the process/ referral to [facility] for in-home aide services while pt is here.  She understands that, while I can attempt to start referral process, she should not expect to have services in place quickly.  She fully understands.  She does, also, ask about having home PT/ OT at dc and confirmed I can assist with this as well.   Have left VM for [facility] social worker, Kathlin Neamo ([phone] ext. [phone]) to discuss needs.    Expected Discharge Plan: Home w Home Health Services Barriers to Discharge: Continued Medical Work up   Patient Goals and CMS Choice Patient states their goals for this hospitalization and ongoing recovery are:: return home          Expected Discharge Plan and Services In-house Referral: Clinical Social Work   Post Acute Care Choice: Home Health Living arrangements for the past 2 months: Single Family Home                                      Prior Living Arrangements/Services Living arrangements for the past 2 months: Single  Family Home Lives with:: Spouse Patient language and need for interpreter reviewed:: Yes Do you feel safe going back to the place where you live?: Yes      Need for Family Participation in Patient Care: Yes (Comment) Care giver support system in place?: Yes (comment)   Criminal Activity/Legal Involvement Pertinent to Current Situation/Hospitalization: No - Comment as needed  Activities of Daily Living   ADL Screening (condition at time of admission) Independently performs ADLs?: Yes (appropriate for developmental age) Is the patient deaf or have difficulty hearing?: Yes Does the patient have difficulty seeing, even when wearing glasses/contacts?: No Does the patient have difficulty concentrating, remembering, or making decisions?: Yes  Permission Sought/Granted Permission sought to share information with : Family Supports Permission granted to share information with : Yes, Verbal Permission Granted  Share Information with NAME: daughter, Michael [name] @ [phone]           Emotional Assessment Appearance:: Appears stated age Attitude/Demeanor/Rapport: Gracious Affect (typically observed): Accepting Orientation: : Oriented to Self, Oriented to Place, Oriented to  Time, Oriented to Situation Alcohol / Substance Use: Not Applicable Psych Involvement: No (comment)  Admission diagnosis:  Confusion [R41.0] CAP (community acquired pneumonia) [J18.9] Pneumonia of left lower lobe due to infectious organism [J18.9] Patient Active Problem List  Diagnosis Date Noted   Urinary frequency 09/20/2023   Generalized weakness 09/20/2023   CAP (community acquired pneumonia) 09/20/2023   Thrombocytopenia (HCC) 09/20/2023   SI (sacroiliac) joint inflammation (HCC) 08/28/2023   Confusion 05/11/2022   Perforation of tympanic membrane 04/18/2022   Sensorineural hearing loss, bilateral 04/18/2022   Tinnitus 04/18/2022   Unspecified abnormalities of gait and mobility 04/18/2022    Parotitis 04/18/2022   Hypokalemia/hypomagnesia 10/26/2021   COVID-19 virus infection 10/26/2021   Hypertension 10/26/2021   Vitreous prolapse of right eye 10/05/2021   Chronic nasal congestion 09/27/2021   Intermediate stage nonexudative age-related macular degeneration of both eyes 07/01/2021   Vitreomacular adhesion of left eye 07/01/2021   Primary open angle glaucoma of left eye, severe stage 07/01/2021   Primary open angle glaucoma of right eye, severe stage 07/01/2021   Pseudophakia of both eyes 07/01/2021   Acute pain of left knee 01/14/2021   Poor balance 01/14/2021   Acute left-sided low back pain without sciatica 11/17/2020   Vestibular disequilibrium    PAF (paroxysmal atrial fibrillation) (HCC) 09/23/2020   Genetic testing 04/27/2020   Aortic atherosclerosis (HCC) 12/24/2019   History of TIA (transient ischemic attack) 09/11/2019   History of colonic polyps    Benign neoplasm of colon    Overactive bladder 05/06/2019   Mild cognitive impairment 05/06/2019   Polyp of large intestine    Asymptomatic varicose veins of left lower extremity 12/16/2017   Hyperglycemia 11/02/2017   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 [place] [address] Phone: [phone] Fax: [phone]     Social Drivers of Health (SDOH) Social History: SDOH Screenings   Food Insecurity: No Food Insecurity (09/20/2023)  Housing: Low Risk  (09/20/2023)  Transportation Needs: No Transportation Needs (09/20/2023)  Utilities:  Not At Risk (09/20/2023)  Alcohol Screen: Low Risk  (06/22/2023)  Depression (PHQ2-9): Low Risk  (09/20/2023)  Financial Resource Strain: Low Risk  (06/22/2023)  Physical Activity: Sufficiently Active (06/22/2023)  Social Connections: Socially Integrated (09/20/2023)  Stress: No Stress Concern Present (06/22/2023)  Tobacco Use: Medium Risk (09/20/2023)  Health Literacy: Adequate Health Literacy (06/22/2023)   SDOH Interventions: Food Insecurity Interventions: Intervention Not Indicated Housing Interventions: Intervention Not Indicated Transportation Interventions: Intervention Not Indicated Utilities Interventions: Intervention Not Indicated Social Connections Interventions: Intervention Not Indicated   Readmission Risk Interventions     No data to display

## 2023-09-21 NOTE — Plan of Care (Signed)
  Problem: Education: Goal: Knowledge of General Education information will improve Description: Including pain rating scale, medication(s)/side effects and non-pharmacologic comfort measures Outcome: Progressing   Problem: Clinical Measurements: Goal: Ability to maintain clinical measurements within normal limits will improve Outcome: Progressing   Problem: Activity: Goal: Risk for activity intolerance will decrease Outcome: Progressing   Problem: Nutrition: Goal: Adequate nutrition will be maintained Outcome: Progressing   Problem: Coping: Goal: Level of anxiety will decrease Outcome: Progressing   Problem: Pain Managment: Goal: General experience of comfort will improve and/or be controlled Outcome: Progressing   Problem: Safety: Goal: Ability to remain free from injury will improve Outcome: Progressing   Problem: Skin Integrity: Goal: Risk for impaired skin integrity will decrease Outcome: Progressing

## 2023-09-21 NOTE — Progress Notes (Signed)
  Progress Note   Patient: Michael [name] FMW:[id number] DOB: 1932/01/30 DOA: 09/20/2023     0 DOS: the patient was seen and examined on 09/21/2023   Brief hospital course: 89 year old male lives at home with his wife, relatively good health for age, presented with several day history of multiple falls at home, generalized weakness, confusion. Initial evaluation revealing for fever and possible pneumonia. Referred for observation.   Consultants None   Procedures/Events None    Assessment and Plan: Community-acquired pneumonia Generalized weakness Mild confusion without encephalopathy Clinically responding.  Strength improving.  Continue empiric antibiotics, monitor respiratory status   Hypokalemia Dehydration Elevated total bilirubin Potassium remains low, will replete.  Magnesium  also low, will replete. Total bilirubin down appropriately with fluids.  Consistent with dehydration.  Stop IV fluids. Check BMP and magnesium  in AM.   Chronic normocytic anemia Thrombocytopenia acute Baseline hemoglobin perhaps around 11.  Has trended down today from 8.9-7.7, probably from fluids.  No evidence of bleeding.  Will repeat CBC in AM. Acute thrombocytopenia a bit worse today, consistent with acute infection.  Will repeat CBC in AM.   Atrial fibrillation Heart rate remains stable.  Continue anticoagulation.   Clinically improved although has decreased hemoglobin and platelet count.  Expect to equilibrate over the next 24 hours.  If stable tomorrow, likely home.     Subjective:  No events charted overnight   Physical Exam: Vitals:   09/20/23 2143 09/20/23 2143 09/21/23 0247 09/21/23 0617  BP:  135/67 139/68 127/83  Pulse:  88 79 70  Resp:  18 18 18   Temp:  98.2 F (36.8 C) 99.5 F (37.5 C) 98.8 F (37.1 C)  TempSrc:  Oral Oral Oral  SpO2: 98% 98% 94% 94%  Weight:      Height:       Physical Exam Vitals reviewed.  Constitutional:      General: He is not in acute  distress.    Appearance: He is not ill-appearing or toxic-appearing.     Comments: Tmax 103, vital signs stable, no hypoxia noted.  Cardiovascular:     Rate and Rhythm: Normal rate and regular rhythm.     Heart sounds: No murmur heard. Pulmonary:     Effort: Pulmonary effort is normal. No respiratory distress.     Breath sounds: No wheezing, rhonchi or rales.  Neurological:     Mental Status: He is alert.  Psychiatric:        Mood and Affect: Mood normal.        Behavior: Behavior normal.     Data Reviewed: Potassium 3.1 Total bilirubin down to 1.3 consistent with dehydration from yesterday Hemoglobin down a bit to 7.7 from 8.9 yesterday Platelets down to 80 Blood cultures pending  Family Communication: neighbor at bedside  Disposition: Status is: Observation      Time spent: 35 minutes  Author: Toribio Door, MD 09/21/2023 8:35 AM  For on call review [email or web address].

## 2023-09-22 ENCOUNTER — Other Ambulatory Visit (HOSPITAL_COMMUNITY): Payer: Self-pay

## 2023-09-22 DIAGNOSIS — M8580 Other specified disorders of bone density and structure, unspecified site: Secondary | ICD-10-CM | POA: Diagnosis present

## 2023-09-22 DIAGNOSIS — R410 Disorientation, unspecified: Secondary | ICD-10-CM | POA: Diagnosis present

## 2023-09-22 DIAGNOSIS — I480 Paroxysmal atrial fibrillation: Secondary | ICD-10-CM | POA: Diagnosis present

## 2023-09-22 DIAGNOSIS — Z1152 Encounter for screening for COVID-19: Secondary | ICD-10-CM | POA: Diagnosis not present

## 2023-09-22 DIAGNOSIS — Z79899 Other long term (current) drug therapy: Secondary | ICD-10-CM | POA: Diagnosis not present

## 2023-09-22 DIAGNOSIS — D649 Anemia, unspecified: Secondary | ICD-10-CM | POA: Diagnosis present

## 2023-09-22 DIAGNOSIS — H409 Unspecified glaucoma: Secondary | ICD-10-CM | POA: Diagnosis present

## 2023-09-22 DIAGNOSIS — Z860100 Personal history of colon polyps, unspecified: Secondary | ICD-10-CM | POA: Diagnosis not present

## 2023-09-22 DIAGNOSIS — Z87891 Personal history of nicotine dependence: Secondary | ICD-10-CM | POA: Diagnosis not present

## 2023-09-22 DIAGNOSIS — D696 Thrombocytopenia, unspecified: Secondary | ICD-10-CM | POA: Diagnosis present

## 2023-09-22 DIAGNOSIS — E860 Dehydration: Secondary | ICD-10-CM | POA: Diagnosis present

## 2023-09-22 DIAGNOSIS — Z7901 Long term (current) use of anticoagulants: Secondary | ICD-10-CM | POA: Diagnosis not present

## 2023-09-22 DIAGNOSIS — Z8249 Family history of ischemic heart disease and other diseases of the circulatory system: Secondary | ICD-10-CM | POA: Diagnosis not present

## 2023-09-22 DIAGNOSIS — K5090 Crohn's disease, unspecified, without complications: Secondary | ICD-10-CM | POA: Diagnosis present

## 2023-09-22 DIAGNOSIS — Z7983 Long term (current) use of bisphosphonates: Secondary | ICD-10-CM | POA: Diagnosis not present

## 2023-09-22 DIAGNOSIS — E2740 Unspecified adrenocortical insufficiency: Secondary | ICD-10-CM | POA: Diagnosis present

## 2023-09-22 DIAGNOSIS — J189 Pneumonia, unspecified organism: Secondary | ICD-10-CM | POA: Diagnosis present

## 2023-09-22 DIAGNOSIS — R296 Repeated falls: Secondary | ICD-10-CM | POA: Diagnosis present

## 2023-09-22 DIAGNOSIS — E876 Hypokalemia: Secondary | ICD-10-CM | POA: Diagnosis present

## 2023-09-22 LAB — CBC
HCT: 26 % — ABNORMAL LOW (ref 39.0–52.0)
Hemoglobin: 8.4 g/dL — ABNORMAL LOW (ref 13.0–17.0)
MCH: 28.6 pg (ref 26.0–34.0)
MCHC: 32.3 g/dL (ref 30.0–36.0)
MCV: 88.4 fL (ref 80.0–100.0)
Platelets: 107 K/uL — ABNORMAL LOW (ref 150–400)
RBC: 2.94 MIL/uL — ABNORMAL LOW (ref 4.22–5.81)
RDW: 20.8 % — ABNORMAL HIGH (ref 11.5–15.5)
WBC: 5.9 K/uL (ref 4.0–10.5)
nRBC: 0 % (ref 0.0–0.2)

## 2023-09-22 LAB — BASIC METABOLIC PANEL WITH GFR
Anion gap: 10 (ref 5–15)
BUN: 15 mg/dL (ref 8–23)
CO2: 21 mmol/L — ABNORMAL LOW (ref 22–32)
Calcium: 7.8 mg/dL — ABNORMAL LOW (ref 8.9–10.3)
Chloride: 102 mmol/L (ref 98–111)
Creatinine, Ser: 0.95 mg/dL (ref 0.61–1.24)
GFR, Estimated: >60 mL/min (ref >60)
Glucose, Bld: 110 mg/dL — ABNORMAL HIGH (ref 70–99)
Potassium: 4.1 mmol/L (ref 3.5–5.1)
Sodium: 133 mmol/L — ABNORMAL LOW (ref 135–145)

## 2023-09-22 LAB — MAGNESIUM: Magnesium: 2.1 mg/dL (ref 1.7–2.4)

## 2023-09-22 MED ORDER — AZITHROMYCIN 500 MG PO TABS
500.0000 mg | ORAL_TABLET | Freq: Every day | ORAL | 0 refills | Status: AC
  Filled 2023-09-22: qty 2, 2d supply, fill #0

## 2023-09-22 MED ORDER — CEFUROXIME AXETIL 500 MG PO TABS
500.0000 mg | ORAL_TABLET | Freq: Two times a day (BID) | ORAL | Status: DC
  Administered 2023-09-22: 500 mg via ORAL
  Filled 2023-09-22: qty 1

## 2023-09-22 MED ORDER — CEFUROXIME AXETIL 500 MG PO TABS: 500.0000 mg | ORAL_TABLET | Freq: Two times a day (BID) | ORAL | Status: DC

## 2023-09-22 MED ORDER — CEFUROXIME AXETIL 500 MG PO TABS
500.0000 mg | ORAL_TABLET | Freq: Two times a day (BID) | ORAL | 0 refills | Status: AC
  Filled 2023-09-22: qty 5, 3d supply, fill #0

## 2023-09-22 NOTE — Discharge Summary (Signed)
 Physician Discharge Summary   Patient: Michael [name] MRN: [id number] DOB: 02-05-1932  Admit date:     09/20/2023  Discharge date: 09/22/23  Discharge Physician: Toribio Door   PCP: [name], MD   Recommendations at discharge:   Resolution of pneumonia Anemia, see below  Discharge Diagnoses: Principal Problem:   CAP (community acquired pneumonia) Active Problems:   PAF (paroxysmal atrial fibrillation) (HCC)   Thrombocytopenia (HCC)  Resolved Problems:   * No resolved hospital problems. *  Hospital Course: 89 year old male lives at home with his wife, relatively good health for age, presented with several day history of multiple falls at home, generalized weakness, confusion. Initial evaluation revealing for fever and pneumonia.  Treated with empiric antibiotics with gradual clinical improvement.  Condition stabilized, discharged home with home health.  Consultants None   Procedures/Events None   Community-acquired pneumonia Generalized weakness Mild confusion without encephalopathy Clinically responding.  Strength improving.  Pleat course of antibiotics as an outpatient. Azithromycin  9/10>9/14 Ceftriaxone  9/10 > 9/11 Cefuroxime  9/12 >9/14   Hypokalemia Dehydration Elevated total bilirubin Potassium repleted.  Total bilirubin trended down appropriately.   Chronic normocytic anemia Thrombocytopenia acute Baseline hemoglobin perhaps around 11.  Trended down with fluids, hemoglobin stabilized.  This can be followed up in the outpatient setting.  No evidence of bleeding. Acute thrombocytopenia context of acute infection, now rebounding.  Expect spontaneous resolution.   Atrial fibrillation Heart rate remains stable.  Continue anticoagulation.  Disposition: Return home, initiate home health PT  Disposition: Home health Diet recommendation:  Regular diet DISCHARGE MEDICATION: Allergies as of 09/22/2023   No Known Allergies      Medication List      TAKE these medications    acetaminophen  500 MG tablet Commonly known as: TYLENOL  Take 500-1,000 mg by mouth every 8 (eight) hours as needed for mild pain or headache.   alendronate  70 MG tablet Commonly known as: FOSAMAX  Take 1 tablet (70 mg total) by mouth every 7 (seven) days. Take with a full glass of water  on an empty stomach.   apixaban  5 MG Tabs tablet Commonly known as: Eliquis  Take 1 tablet (5 mg total) by mouth 2 (two) times daily.   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   azithromycin  500 MG tablet Commonly known as: ZITHROMAX  Take 1 tablet (500 mg total) by mouth daily for 2 days. Start 9/13 AM   B-D 3CC LUER-LOK SYR 25GX1 25G X 1 3 ML Misc Generic drug: SYRINGE-NEEDLE (DISP) 3 ML USE MONTHLY FOR B-12 INJECTIONS   budesonide  3 MG 24 hr capsule Commonly known as: ENTOCORT EC  AS NEEDED FOR FLARETake 3 capsules (9 mg total) by mouth daily, THEN 2 capsules (6 mg total) daily, THEN 1 capsule (3 mg total) daily. AS NEEDED FOR FLARE.   cefUROXime  500 MG tablet Commonly known as: CEFTIN  Take 1 tablet (500 mg total) by mouth 2 (two) times daily with a meal for 5 doses. Start 9/12 PM   cyanocobalamin  1000 MCG/ML injection Commonly known as: VITAMIN B12 INJECT 1ML INTO THE MUSCLE ONCE MONTHLY   desmopressin  0.1 MG tablet Commonly known as: DDAVP  Take 100 mcg by mouth daily.   dorzolamide -timolol  2-0.5 % ophthalmic solution Commonly known as: COSOPT  Place 1 drop into the left eye 2 (two) times daily.   latanoprost  0.005 % ophthalmic solution Commonly known as: XALATAN  Place 1 drop into the left eye at bedtime.  loperamide  2 MG tablet Commonly known as: IMODIUM  A-D Take 2 mg by mouth daily as needed for diarrhea or loose stools.   metoprolol  succinate 25 MG 24 hr tablet Commonly known as: TOPROL -XL TAKE HALF TABLET BY MOUTH DAILY    Netarsudil  Dimesylate 0.02 % Soln Place 1 drop into the left eye every evening.   omeprazole  20 MG tablet Commonly known as: PRILOSEC  OTC Take 20 mg by mouth daily before breakfast.   tamsulosin  0.4 MG Caps capsule Commonly known as: FLOMAX  Take 0.4 mg by mouth at bedtime.   vitamin C  1000 MG tablet Take 1,000 mg by mouth daily with breakfast.   Vitamin D3 50 MCG (2000 UT) Tabs Take 2,000 Units by mouth daily with breakfast.        Follow-up Information     [name], MD. Schedule an appointment as soon as possible for a visit in 1 week(s).   Specialty: Internal Medicine Contact information: [address] [place] [address] [phone]                Discharge Exam: Fredricka Weights   09/20/23 1240  Weight: 77.1 kg   Physical Exam Vitals reviewed.  Constitutional:      General: He is not in acute distress.    Appearance: He is not ill-appearing or toxic-appearing.  Cardiovascular:     Rate and Rhythm: Normal rate and regular rhythm.     Heart sounds: No murmur heard. Pulmonary:     Effort: Pulmonary effort is normal. No respiratory distress.     Breath sounds: No wheezing, rhonchi or rales.  Neurological:     Mental Status: He is alert.  Psychiatric:        Mood and Affect: Mood normal.        Behavior: Behavior normal.      Condition at discharge: good  The results of significant diagnostics from this hospitalization (including imaging, microbiology, ancillary and laboratory) are listed below for reference.   Imaging Studies: DG Knee Complete 4 Views Left Result Date: 09/20/2023 CLINICAL DATA:  Left knee pain after fall. EXAM: LEFT KNEE - COMPLETE 4+ VIEW COMPARISON:  January 14, 2021. FINDINGS: No evidence of fracture, dislocation, or joint effusion. Moderate narrowing of medial joint space is noted. Soft tissues are unremarkable. IMPRESSION: Moderate degenerative joint disease is noted medially. No acute abnormality seen.  Electronically Signed   By: Lynwood Landy Raddle M.D.   On: 09/20/2023 14:04   DG Chest Port 1 View Result Date: 09/20/2023 CLINICAL DATA:  Questionable sepsis. EXAM: PORTABLE CHEST 1 VIEW COMPARISON:  October 27, 2021 FINDINGS: The heart size and mediastinal contours are within normal limits. Low lung volumes are noted. Mild atelectasis and/or infiltrate is seen within the left lung base. No pleural effusion or pneumothorax is identified. The visualized skeletal structures are unremarkable. IMPRESSION: Low lung volumes with mild left basilar atelectasis and/or infiltrate. Electronically Signed   By: Suzen Dials M.D.   On: 09/20/2023 13:42    Microbiology: Results for orders placed or performed during the hospital encounter of 09/20/23  Culture, blood (Routine x 2)     Status: None (Preliminary result)   Collection Time: 09/20/23  1:00 PM   Specimen: BLOOD  Result Value Ref Range Status   Specimen Description   Final    BLOOD RIGHT ANTECUBITAL Performed at [facility], [address] [place], [place] [address]    Special Requests   Final    BOTTLES DRAWN AEROBIC  AND ANAEROBIC Blood Culture adequate volume Performed at [facility], [address], [place], [place] [address]    Culture   Final    NO GROWTH 2 DAYS Performed at [facility], [address], [place], [place] [address]    Report Status PENDING  Incomplete  Resp panel by RT-PCR (RSV, Flu A&B, Covid) Anterior Nasal Swab     Status: None   Collection Time: 09/20/23  2:08 PM   Specimen: Anterior Nasal Swab  Result Value Ref Range Status   SARS Coronavirus 2 by RT PCR NEGATIVE NEGATIVE Final    Comment: (NOTE) SARS-CoV-2 target nucleic acids are NOT DETECTED.  The SARS-CoV-2 RNA is generally detectable in upper respiratory specimens during the acute phase of infection. The lowest concentration of SARS-CoV-2 viral copies this assay can detect is 138 copies/mL. A negative result  does not preclude SARS-Cov-2 infection and should not be used as the sole basis for treatment or other patient management decisions. A negative result may occur with  improper specimen collection/handling, submission of specimen other than nasopharyngeal swab, presence of viral mutation(s) within the areas targeted by this assay, and inadequate number of viral copies(<138 copies/mL). A negative result must be combined with clinical observations, patient history, and epidemiological information. The expected result is Negative.  Fact Sheet for Patients:  BloggerCourse.com  Fact Sheet for Healthcare Providers:  SeriousBroker.it  This test is no t yet approved or cleared by the [place]  FDA and  has been authorized for detection and/or diagnosis of SARS-CoV-2 by FDA under an Emergency Use Authorization (EUA). This EUA will remain  in effect (meaning this test can be used) for the duration of the COVID-19 declaration under Section 564(b)(1) of the Act, 21 U.S.C.section 360bbb-3(b)(1), unless the authorization is terminated  or revoked sooner.       Influenza A by PCR NEGATIVE NEGATIVE Final   Influenza B by PCR NEGATIVE NEGATIVE Final    Comment: (NOTE) The Xpert Xpress SARS-CoV-2/FLU/RSV plus assay is intended as an aid in the diagnosis of influenza from Nasopharyngeal swab specimens and should not be used as a sole basis for treatment. Nasal washings and aspirates are unacceptable for Xpert Xpress SARS-CoV-2/FLU/RSV testing.  Fact Sheet for Patients: BloggerCourse.com  Fact Sheet for Healthcare Providers: SeriousBroker.it  This test is not yet approved or cleared by the [place]  FDA and has been authorized for detection and/or diagnosis of SARS-CoV-2 by FDA under an Emergency Use Authorization (EUA). This EUA will remain in effect (meaning this test can be used) for  the duration of the COVID-19 declaration under Section 564(b)(1) of the Act, 21 U.S.C. section 360bbb-3(b)(1), unless the authorization is terminated or revoked.     Resp Syncytial Virus by PCR NEGATIVE NEGATIVE Final    Comment: (NOTE) Fact Sheet for Patients: BloggerCourse.com  Fact Sheet for Healthcare Providers: SeriousBroker.it  This test is not yet approved or cleared by the [place]  FDA and has been authorized for detection and/or diagnosis of SARS-CoV-2 by FDA under an Emergency Use Authorization (EUA). This EUA will remain in effect (meaning this test can be used) for the duration of the COVID-19 declaration under Section 564(b)(1) of the Act, 21 U.S.C. section 360bbb-3(b)(1), unless the authorization is terminated or revoked.  Performed at [facility], [address] [place], [place] [address]   Culture, blood (Routine x 2)     Status: None (Preliminary result)   Collection Time: 09/20/23  6:19 PM   Specimen:  BLOOD LEFT HAND  Result Value Ref Range Status   Specimen Description   Final    BLOOD LEFT HAND Performed at [facility], [address], [place], [place] [address]    Special Requests   Final    BOTTLES DRAWN AEROBIC ONLY Blood Culture adequate volume Performed at [facility], [address] [place], [place] [address]    Culture   Final    NO GROWTH 2 DAYS Performed at [facility], [address] [place], [place] [address]    Report Status PENDING  Incomplete    Labs: CBC: Recent Labs  Lab 09/20/23 1300 09/21/23 0339 09/22/23 0320  WBC 9.5 6.4 5.9  NEUTROABS 7.6  --   --   HGB 8.9* 7.7* 8.4*  HCT 27.8* 23.5* 26.0*  MCV 89.1 90.0 88.4  PLT 111* 80* 107*   Basic Metabolic Panel: Recent Labs  Lab 09/20/23 1300 09/21/23 0339 09/22/23 0320  NA 133* 135 133*  K 3.1* 3.1* 4.1  CL 96* 101 102  CO2 24 21* 21*  GLUCOSE 105* 105* 110*   BUN 17 14 15   CREATININE 1.06 0.77 0.95  CALCIUM  8.3* 8.0* 7.8*  MG  --  1.5* 2.1   Liver Function Tests: Recent Labs  Lab 09/20/23 1300 09/21/23 0339  AST 46* 35  ALT 17 14  ALKPHOS 58 81  BILITOT 3.4* 1.3*  PROT 6.4* 5.3*  ALBUMIN 3.3* 2.6*   CBG: No results for input(s): GLUCAP in the last 168 hours.  Discharge time spent: less than 30 minutes.  Signed: Toribio Door, MD [facility] 09/22/2023

## 2023-09-22 NOTE — Progress Notes (Addendum)
 Discharge meds delivered by pharmacy per family.  PIV removed as noted. AVS reviewed w/ pt & daughter. Pt to start azithromycin  tomorrow - loading dose corrected in pen to reflect start date of tomorrow, Daughter will check B/P prior to giving metoprolol . Pt dressed for d/c to home. Late entry at 11:30: discharge meds were not delivered, this RN confirmed meds were still in [facility] pharmacy,  call to patient's daughter to update. Bag of medication family had thought was the discharge medications was from [facility]. Patient's daughter will return to [facility] to pick up discharge medication.

## 2023-09-25 ENCOUNTER — Telehealth: Payer: Self-pay

## 2023-09-25 LAB — CULTURE, BLOOD (ROUTINE X 2)
Culture: NO GROWTH
Culture: NO GROWTH
Special Requests: ADEQUATE
Special Requests: ADEQUATE

## 2023-09-25 NOTE — Transitions of Care (Post Inpatient/ED Visit) (Unsigned)
   09/25/2023  Name: Michael [name] MRN: [id number] DOB: 10-20-1932  Today's TOC FU Call Status: Today's TOC FU Call Status:: Unsuccessful Call (1st Attempt) Unsuccessful Call (1st Attempt) Date: 09/25/23  Attempted to reach the patient regarding the most recent Inpatient/ED visit.  Follow Up Plan: Additional outreach attempts will be made to reach the patient to complete the Transitions of Care (Post Inpatient/ED visit) call.   Signature Julian Lemmings, LPN [facility] Nurse Health Advisor Direct Dial [phone]

## 2023-09-26 ENCOUNTER — Telehealth: Payer: Self-pay | Admitting: Radiology

## 2023-09-26 NOTE — Telephone Encounter (Signed)
 Copied from CRM [id number]. Topic: General - Other >> Sep 26, 2023 12:14 PM Burnard DEL wrote: Reason for CRM: Patient would like to know if he could have a letter prepared for [facility] that he has been going to stating that he is okay to continue going to the gym.He is schedule for his HFU on 10/02/2023

## 2023-09-26 NOTE — Transitions of Care (Post Inpatient/ED Visit) (Signed)
 09/26/2023  Name: Michael [name] MRN: [id number] DOB: [birth year]  Today's TOC FU Call Status: Today's TOC FU Call Status:: Successful TOC FU Call Completed Unsuccessful Call (1st Attempt) Date: 09/25/23 [facility] FU Call Complete Date: 09/26/23 Patient's Name and Date of Birth confirmed.  Transition Care Management Follow-up Telephone Call Date of Discharge: 09/22/23 Discharge Facility: [facility]) Type of Discharge: Inpatient Admission Primary Inpatient Discharge Diagnosis:: pneumonia How have you been since you were released from the hospital?: Better Any questions or concerns?: No  Items Reviewed: Did you receive and understand the discharge instructions provided?: Yes Medications obtained,verified, and reconciled?: Yes (Medications Reviewed) Any new allergies since your discharge?: No Dietary orders reviewed?: Yes Do you have support at home?: No  Medications Reviewed Today: Medications Reviewed Today     Reviewed by Emmitt Pan, LPN (Licensed Practical Nurse) on 09/26/23 at 1118  Med List Status: <None>   Medication Order Taking? Sig Documenting Provider Last Dose Status Informant  acetaminophen  (TYLENOL ) 500 MG tablet [id number] Yes Take 500-1,000 mg by mouth every 8 (eight) hours as needed for mild pain or headache.  [provider]  Active   alendronate  (FOSAMAX ) 70 MG tablet [id number] Yes Take 1 tablet (70 mg total) by mouth every 7 (seven) days. Take with a full glass of water  on an empty stomach. [name], MD  Active   apixaban  (ELIQUIS ) 5 MG TABS tablet [id number] Yes Take 1 tablet (5 mg total) by mouth 2 (two) times daily. Verlin Lonni BIRCH, MD  Active            Med Note (COFFELL, ANGELA M   Thu Sep 21, 2023  1:48 PM) [facility] : LF 06/25/23 #180.  Ascorbic Acid  (VITAMIN C ) 1000 MG tablet [id number] Yes Take 1,000 mg by mouth daily with breakfast.  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  [provider]  Active   B-D 3CC LUER-LOK SYR 25GX1 25G X 1 3 ML MISC [id number] Yes USE MONTHLY FOR B-12 INJECTIONS Burns, Glade PARAS, MD  Active   budesonide  (ENTOCORT EC ) 3 MG 24 hr capsule [id number] Yes AS NEEDED FOR FLARETake 3 capsules (9 mg total) by mouth daily, THEN 2 capsules (6 mg total) daily, THEN 1 capsule (3 mg total) daily. AS NEEDED FOR FLARE. [name], MD  Active   cefUROXime  (CEFTIN ) 500 MG tablet [id number] Yes Take 1 tablet (500 mg total) by mouth 2 (two) times daily with a meal for 5 doses. Start 9/12 PM Jadine Toribio SQUIBB, MD  Active   Cholecalciferol  (VITAMIN D3) 50 MCG (2000 UT) TABS [id number] Yes Take 2,000 Units by mouth daily with breakfast. [provider]  Active   cyanocobalamin  (VITAMIN B12) 1000 MCG/ML injection [id number] Yes INJECT 1ML INTO THE MUSCLE ONCE MONTHLY Burns, Glade PARAS, MD  Active   desmopressin  (DDAVP ) 0.1 MG tablet [id number] Yes Take 100 mcg by mouth daily. [provider]  Active   dorzolamide -timolol  (COSOPT ) 22.3-6.8 MG/ML ophthalmic solution [id number] Yes Place 1 drop into the left eye 2 (two) times daily. [provider]  Active            Med Note (COFFELL, JON HERO   Thu Sep 21, 2023  1:49 PM) [facility] : LF 09/13/23 #37mL.  latanoprost  (XALATAN ) 0.005 % ophthalmic solution [id number] Yes Place 1 drop into the left eye at bedtime. [provider]  Active            Med Note (COFFELL, JON HERO   Thu Sep 21, 2023  1:50 PM) [facility] : LF 02/27/23 #7.31mL.  loperamide  (IMODIUM  A-D) 2 MG tablet [id number] Yes Take 2 mg by mouth daily as needed for diarrhea or loose stools. [provider]  Active   metoprolol  succinate (TOPROL -XL) 25 MG 24 hr tablet [id number] Yes TAKE HALF TABLET BY MOUTH DAILY Verlin Lonni BIRCH, MD  Active   Netarsudil  Dimesylate 0.02 % SOLN [id number] Yes Place 1 drop into the left eye every  evening. [provider]  Active            Med Note (COFFELL, JON HERO   Thu Sep 21, 2023  1:51 PM) [facility] : LF 09/13/23 #2.71mL.  omeprazole  (PRILOSEC  OTC) 20 MG tablet [id number] Yes Take 20 mg by mouth daily before breakfast. [provider]  Active   tamsulosin  (FLOMAX ) 0.4 MG CAPS capsule [id number] Yes Take 0.4 mg by mouth at bedtime. [provider]  Active             Home Care and Equipment/Supplies: Were Home Health Services Ordered?: Yes Name of Home Health Agency:: [email or web address] Has Agency set up a time to come to your home?: Yes First Home Health Visit Date: 09/25/23 Any new equipment or medical supplies ordered?: No  Functional Questionnaire: Do you need assistance with bathing/showering or dressing?: No Do you need assistance with meal preparation?: No Do you need assistance with eating?: No Do you have difficulty maintaining continence: No Do you need assistance with getting out of bed/getting out of a chair/moving?: No Do you have difficulty managing or taking your medications?: No  Follow up appointments reviewed: PCP Follow-up appointment confirmed?: Yes Date of PCP follow-up appointment?: 10/02/23 Follow-up Provider: [facility] Follow-up appointment confirmed?: NA Do you need transportation to your follow-up appointment?: No Do you understand care options if your condition(s) worsen?: Yes-patient verbalized understanding    SIGNATURE Julian Lemmings, LPN [facility] Nurse Health Advisor Direct Dial [phone]

## 2023-09-27 ENCOUNTER — Other Ambulatory Visit: Payer: Self-pay | Admitting: Internal Medicine

## 2023-09-27 ENCOUNTER — Telehealth: Payer: Self-pay | Admitting: Radiology

## 2023-09-27 NOTE — Telephone Encounter (Signed)
Letter e-mailed today.

## 2023-09-27 NOTE — Telephone Encounter (Signed)
 Copied from CRM [id number]. Topic: General - Other >> Sep 27, 2023  1:37 PM Thersia BROCKS wrote: Reason for CRM: Patient called in regarding needing a clearance form for the gym   [facility]  Address: [address] [place], [place], [place] [address] Phone: [phone]

## 2023-09-27 NOTE — Telephone Encounter (Signed)
 Letter completed and placed in Dr. Geofm folder for signature.

## 2023-09-27 NOTE — Telephone Encounter (Signed)
 Letter emailed today to [email or web address]

## 2023-10-01 ENCOUNTER — Encounter: Payer: Self-pay | Admitting: Internal Medicine

## 2023-10-01 NOTE — Progress Notes (Unsigned)
 Subjective:    Patient ID: Michael [name], male    DOB: 11-Apr-1932, 89 y.o.   MRN: [id number]     HPI [place] is here for follow up from the hospital.   Admitted 9/10 - 9/12 for CAP  Had been having several days of multiple falls at home, generalized weakness, confusion, fever and increased urinary frequency.  Temperature in the emergency room was 100.9, pulse 113, BP 146/76, O2 saturation 96% on room air.  Potassium was 3.1, AST modestly elevated, total bilirubin 3.4-likely related to dehydration.  Hemoglobin 8.9-stable, INR 1.8.  Viral panel negative.  UA negative.  Chest x-ray showed left base pneumonia.   community acquired pneumonia, LLL: Associated with generalized weakness, mild confusion Started on antibiotics azithromycin , ceftriaxone -after 2 days ceftriaxone  changed to cefuroxime .  Antibiotics 9/10-9/14 Clinically improved   Hypokalemia, dehydration, elevated total bilirubin: Potassium repleted Total bilirubin trended down appropriately   Chronic normocytic anemia, thrombocytopenia-acute: Baseline hemoglobin around 11-trended down with fluids, hemoglobin stabilized No evidence of bleeding Follow-up hemoglobin as outpatient Acute thrombocytopenia-related to acute infection-improved   Atrial fibrillation: Heart rate stable Continue anticoagulation   Discharged to home, home PT ordered   Completed the antibiotics.  PT has come once.  Using walker - getting stronger.  He started back at the gym - has gone 3 days.  Has a trainer once a week.     Medications and allergies reviewed with patient and updated if appropriate.  Current Outpatient Medications on File Prior to Visit  Medication Sig Dispense Refill   acetaminophen  (TYLENOL ) 500 MG tablet Take 500-1,000 mg by mouth every 8 (eight) hours as needed for mild pain or headache.      alendronate  (FOSAMAX ) 70 MG tablet Take 1 tablet (70 mg total) by mouth every 7 (seven) days. Take with a full glass of  water  on an empty stomach. 12 tablet 3   apixaban  (ELIQUIS ) 5 MG TABS tablet Take 1 tablet (5 mg total) by mouth 2 (two) times daily. 60 tablet 5   Ascorbic Acid  (VITAMIN C ) 1000 MG tablet Take 1,000 mg by mouth daily with breakfast.      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      B-D 3CC LUER-LOK SYR 25GX1 25G X 1 3 ML MISC USE MONTHLY FOR B-12 INJECTIONS 12 each 0   budesonide  (ENTOCORT EC ) 3 MG 24 hr capsule AS NEEDED FOR FLARETake 3 capsules (9 mg total) by mouth daily, THEN 2 capsules (6 mg total) daily, THEN 1 capsule (3 mg total) daily. AS NEEDED FOR FLARE.     Cholecalciferol  (VITAMIN D3) 50 MCG (2000 UT) TABS Take 2,000 Units by mouth daily with breakfast.     cyanocobalamin  (VITAMIN B12) 1000 MCG/ML injection INJECT 1ML INTO THE MUSCLE ONCE MONTHLY 10 mL 0   desmopressin  (DDAVP ) 0.1 MG tablet Take 100 mcg by mouth daily.     dorzolamide -timolol  (COSOPT ) 22.3-6.8 MG/ML ophthalmic solution Place 1 drop into the left eye 2 (two) times daily.     latanoprost  (XALATAN ) 0.005 % ophthalmic solution Place 1 drop into the left eye at bedtime.     loperamide  (IMODIUM  A-D) 2 MG tablet Take 2 mg by mouth daily as needed for diarrhea or loose stools.     metoprolol  succinate (TOPROL -XL) 25 MG 24 hr tablet TAKE HALF TABLET BY MOUTH DAILY 45 tablet 3   Netarsudil  Dimesylate 0.02 % SOLN Place 1  drop into the left eye every evening.     omeprazole  (PRILOSEC  OTC) 20 MG tablet Take 20 mg by mouth daily before breakfast.     tamsulosin  (FLOMAX ) 0.4 MG CAPS capsule Take 0.4 mg by mouth at bedtime.     No current facility-administered medications on file prior to visit.     Review of Systems  Constitutional:  Negative for appetite change (good), chills and fever.       Energy level is good  Respiratory:  Negative for cough, chest tightness, shortness of breath and wheezing.   Cardiovascular:   Positive for leg swelling. Negative for chest pain and palpitations.  Gastrointestinal:  Positive for diarrhea (improved on budesonide ). Negative for abdominal pain, blood in stool and nausea.  Neurological:  Negative for light-headedness and headaches.       Objective:   Vitals:   10/02/23 1446  BP: 122/70  Pulse: 67  Temp: 98 F (36.7 C)  SpO2: 97%   BP Readings from Last 3 Encounters:  10/02/23 122/70  09/22/23 100/81  09/20/23 114/68   Wt Readings from Last 3 Encounters:  10/02/23 167 lb (75.8 kg)  09/20/23 170 lb (77.1 kg)  08/28/23 171 lb (77.6 kg)   Body mass index is 23.96 kg/m.    Physical Exam Constitutional:      General: He is not in acute distress.    Appearance: Normal appearance. He is not ill-appearing.  HENT:     Head: Normocephalic and atraumatic.  Eyes:     Conjunctiva/sclera: Conjunctivae normal.  Cardiovascular:     Rate and Rhythm: Normal rate and regular rhythm.     Heart sounds: Normal heart sounds.  Pulmonary:     Effort: Pulmonary effort is normal. No respiratory distress.     Breath sounds: Normal breath sounds. No wheezing or rales.  Musculoskeletal:     Right lower leg: Edema (1 + edema) present.     Left lower leg: Edema (1 + edema) present.  Skin:    General: Skin is warm and dry.     Findings: No rash.  Neurological:     Mental Status: He is alert. Mental status is at baseline.  Psychiatric:        Mood and Affect: Mood normal.        Lab Results  Component Value Date   WBC 5.9 09/22/2023   HGB 8.4 (L) 09/22/2023   HCT 26.0 (L) 09/22/2023   PLT 107 (L) 09/22/2023   GLUCOSE 110 (H) 09/22/2023   CHOL 82 02/21/2023   TRIG 92.0 02/21/2023   HDL 31.40 (L) 02/21/2023   LDLDIRECT 60.6 09/11/2019   LDLCALC 32 02/21/2023   ALT 14 09/21/2023   AST 35 09/21/2023   NA 133 (L) 09/22/2023   K 4.1 09/22/2023   CL 102 09/22/2023   CREATININE 0.95 09/22/2023   BUN 15 09/22/2023   CO2 21 (L) 09/22/2023   TSH 2.50 02/21/2023    INR 1.8 (H) 09/20/2023   HGBA1C 5.6 01/14/2021     Assessment & Plan:    See Problem List for Assessment and Plan of chronic medical problems.

## 2023-10-01 NOTE — Patient Instructions (Addendum)
      Blood work was ordered.       Medications changes include :   None   Elevated you legs when sitting.   Let me know if your leg swelling does not improve.      Return in about 4 months (around 02/01/2024) for follow up.

## 2023-10-02 ENCOUNTER — Ambulatory Visit (INDEPENDENT_AMBULATORY_CARE_PROVIDER_SITE_OTHER): Admitting: Internal Medicine

## 2023-10-02 ENCOUNTER — Telehealth: Payer: Self-pay

## 2023-10-02 VITALS — BP 122/70 | HR 67 | Temp 98.0°F | Ht 70.0 in | Wt 167.0 lb

## 2023-10-02 DIAGNOSIS — D649 Anemia, unspecified: Secondary | ICD-10-CM | POA: Diagnosis not present

## 2023-10-02 DIAGNOSIS — I10 Essential (primary) hypertension: Secondary | ICD-10-CM | POA: Diagnosis not present

## 2023-10-02 DIAGNOSIS — R600 Localized edema: Secondary | ICD-10-CM

## 2023-10-02 DIAGNOSIS — R410 Disorientation, unspecified: Secondary | ICD-10-CM

## 2023-10-02 DIAGNOSIS — I480 Paroxysmal atrial fibrillation: Secondary | ICD-10-CM

## 2023-10-02 DIAGNOSIS — D696 Thrombocytopenia, unspecified: Secondary | ICD-10-CM

## 2023-10-02 DIAGNOSIS — J189 Pneumonia, unspecified organism: Secondary | ICD-10-CM

## 2023-10-02 DIAGNOSIS — R531 Weakness: Secondary | ICD-10-CM

## 2023-10-02 LAB — CBC WITH DIFFERENTIAL/PLATELET
Basophils Absolute: 0.1 K/uL (ref 0.0–0.1)
Basophils Relative: 0.9 % (ref 0.0–3.0)
Eosinophils Absolute: 0 K/uL (ref 0.0–0.7)
Eosinophils Relative: 0.3 % (ref 0.0–5.0)
HCT: 29.1 % — ABNORMAL LOW (ref 39.0–52.0)
Hemoglobin: 9.8 g/dL — ABNORMAL LOW (ref 13.0–17.0)
Lymphocytes Relative: 20.1 % (ref 12.0–46.0)
Lymphs Abs: 1.4 K/uL (ref 0.7–4.0)
MCHC: 33.7 g/dL (ref 30.0–36.0)
MCV: 87.1 fl (ref 78.0–100.0)
Monocytes Absolute: 0.5 K/uL (ref 0.1–1.0)
Monocytes Relative: 7.1 % (ref 3.0–12.0)
Neutro Abs: 5 K/uL (ref 1.4–7.7)
Neutrophils Relative %: 71.6 % (ref 43.0–77.0)
Platelets: 161 K/uL (ref 150.0–400.0)
RBC: 3.34 Mil/uL — ABNORMAL LOW (ref 4.22–5.81)
RDW: 22.4 % — ABNORMAL HIGH (ref 11.5–15.5)
WBC: 7 K/uL (ref 4.0–10.5)

## 2023-10-02 LAB — COMPREHENSIVE METABOLIC PANEL WITH GFR
ALT: 12 U/L (ref 0–53)
AST: 14 U/L (ref 0–37)
Albumin: 3.5 g/dL (ref 3.5–5.2)
Alkaline Phosphatase: 47 U/L (ref 39–117)
BUN: 13 mg/dL (ref 6–23)
CO2: 30 meq/L (ref 19–32)
Calcium: 8.6 mg/dL (ref 8.4–10.5)
Chloride: 100 meq/L (ref 96–112)
Creatinine, Ser: 0.86 mg/dL (ref 0.40–1.50)
GFR: 76.66 mL/min (ref >60.00)
Glucose, Bld: 106 mg/dL — ABNORMAL HIGH (ref 70–99)
Potassium: 3.4 meq/L — ABNORMAL LOW (ref 3.5–5.1)
Sodium: 137 meq/L (ref 135–145)
Total Bilirubin: 1.1 mg/dL (ref 0.2–1.2)
Total Protein: 6.4 g/dL (ref 6.0–8.3)

## 2023-10-02 LAB — BRAIN NATRIURETIC PEPTIDE: Pro B Natriuretic peptide (BNP): 242 pg/mL — ABNORMAL HIGH (ref 0.0–100.0)

## 2023-10-02 NOTE — Assessment & Plan Note (Signed)
 Chronic Blood pressure well-controlled denies lightheadedness Continue metoprolol  XL 12.5 mg daily

## 2023-10-02 NOTE — Assessment & Plan Note (Signed)
 He was seen here 12 days ago for acute generalized weakness Related to pneumonia Has improved Going back to the event and energy level and strength is slowly improving

## 2023-10-02 NOTE — Assessment & Plan Note (Signed)
 Chronic Slight worsening of anemia while in hospital  No signs of GI bleed Check cbc

## 2023-10-02 NOTE — Telephone Encounter (Signed)
 Noted.   We are seeing patient today for appointment.

## 2023-10-02 NOTE — Telephone Encounter (Signed)
 Copied from CRM [id number]. Topic: Clinical - Medication Question >> Oct 02, 2023 12:55 PM Precious C wrote: Reason for CRM: pt called to informed (as requested by PCP) that he's taking the budesonide  (ENTOCORT EC ) 3 MG 24 hr capsule

## 2023-10-02 NOTE — Assessment & Plan Note (Signed)
 Acute LLL  Hospitalized 2 days Completed antibiotics Symptoms improved

## 2023-10-02 NOTE — Assessment & Plan Note (Signed)
Chronic  cbc 

## 2023-10-02 NOTE — Assessment & Plan Note (Signed)
 Resolved Related to CAP

## 2023-10-02 NOTE — Assessment & Plan Note (Signed)
 Chronic Following with cardiology He is on Eliquis  5 mg twice daily and metoprolol  XL 12.5 mg daily

## 2023-10-04 ENCOUNTER — Ambulatory Visit: Payer: Self-pay | Admitting: Internal Medicine

## 2023-10-05 DIAGNOSIS — L821 Other seborrheic keratosis: Secondary | ICD-10-CM | POA: Diagnosis not present

## 2023-10-05 DIAGNOSIS — D692 Other nonthrombocytopenic purpura: Secondary | ICD-10-CM | POA: Diagnosis not present

## 2023-10-05 DIAGNOSIS — L570 Actinic keratosis: Secondary | ICD-10-CM | POA: Diagnosis not present

## 2023-10-05 DIAGNOSIS — Z85828 Personal history of other malignant neoplasm of skin: Secondary | ICD-10-CM | POA: Diagnosis not present

## 2023-10-09 ENCOUNTER — Ambulatory Visit: Admitting: Family Medicine

## 2023-10-09 ENCOUNTER — Ambulatory Visit: Payer: Self-pay

## 2023-10-09 NOTE — Telephone Encounter (Signed)
 FYI Only or Action Required?: Action required by provider: request for appointment.  Patient was last seen in primary care on 10/02/2023 by [name], MD.  Called Nurse Triage reporting Leg Swelling.  Symptoms began a week ago.  Interventions attempted: Nothing.  Symptoms are: unchanged.  Triage Disposition: See Physician Within 24 Hours  Patient/caregiver understands and will follow disposition?:   Copied from CRM #[id number]. Topic: Clinical - Red Word Triage >> Oct 09, 2023  9:38 AM [name] wrote: Red Word that prompted transfer to Nurse Triage: Patient stated that if he still has swelling in his legs and Carla instructed him to call her if he still has swelling in his legs. Michael [name] is not in today. Reason for Disposition  [1] MODERATE leg swelling (e.g., swelling extends up to knees) AND [2] new-onset or getting worse  Answer Assessment - Initial Assessment Questions No available appts with pcp, scheduled 10/09/23. Advised UC/ED if symptoms worsen. Patient requesting CALL BACK and appt with PCP.  1. ONSET: When did the swelling start? (e.g., minutes, hours, days)     Last week, already seen Dr. Geofm and was to told to call back if still present. 2. LOCATION: What part of the leg is swollen?  Are both legs swollen or just one leg?     Both legs, left leg worse Buttock right pain-week ago, 9/10, denies numbness/ weakness, 3. SEVERITY: How bad is the swelling? (e.g., localized; mild, moderate, severe)     Knees to calves 4. REDNESS: Is there redness or signs of infection?     denies 5. PAIN: Is the swelling painful to touch? If Yes, ask: How painful is it?   (Scale 1-10; mild, moderate or severe)     no 6. FEVER: Do you have a fever? If Yes, ask: What is it, how was it measured, and when did it start?      Denies fever , chills 7. CAUSE: What do you think is causing the leg swelling?     unsure 8. MEDICAL HISTORY: Do you have a history of blood clots  (e.g., DVT), cancer, heart failure, kidney disease, or liver failure?     no 9. RECURRENT SYMPTOM: Have you had leg swelling before? If Yes, ask: When was the last time? What happened that time?     yes 10. OTHER SYMPTOMS: Do you have any other symptoms? (e.g., chest pain, difficulty breathing)       Denies pain, difficulty breathing  Protocols used: Leg Swelling and Edema-A-AH

## 2023-10-09 NOTE — Telephone Encounter (Signed)
 Copied from CRM #[id number]. Topic: General - Other >> Oct 09, 2023 10:22 AM Robinson H wrote: Reason for CRM: Patients son in law states he wants to take patient for a massage before having him take medication or get a shot for his issue

## 2023-10-10 DIAGNOSIS — D649 Anemia, unspecified: Secondary | ICD-10-CM

## 2023-10-10 DIAGNOSIS — E538 Deficiency of other specified B group vitamins: Secondary | ICD-10-CM

## 2023-10-10 DIAGNOSIS — K5090 Crohn's disease, unspecified, without complications: Secondary | ICD-10-CM | POA: Diagnosis not present

## 2023-10-10 DIAGNOSIS — H9190 Unspecified hearing loss, unspecified ear: Secondary | ICD-10-CM

## 2023-10-10 DIAGNOSIS — I10 Essential (primary) hypertension: Secondary | ICD-10-CM

## 2023-10-10 DIAGNOSIS — H401123 Primary open-angle glaucoma, left eye, severe stage: Secondary | ICD-10-CM

## 2023-10-10 DIAGNOSIS — J189 Pneumonia, unspecified organism: Secondary | ICD-10-CM | POA: Diagnosis not present

## 2023-10-10 DIAGNOSIS — I700 Atherosclerosis of aorta: Secondary | ICD-10-CM | POA: Diagnosis not present

## 2023-10-10 DIAGNOSIS — H353132 Nonexudative age-related macular degeneration, bilateral, intermediate dry stage: Secondary | ICD-10-CM

## 2023-10-10 DIAGNOSIS — E876 Hypokalemia: Secondary | ICD-10-CM

## 2023-10-10 DIAGNOSIS — I4811 Longstanding persistent atrial fibrillation: Secondary | ICD-10-CM | POA: Diagnosis not present

## 2023-10-10 DIAGNOSIS — H9319 Tinnitus, unspecified ear: Secondary | ICD-10-CM

## 2023-10-28 ENCOUNTER — Observation Stay (HOSPITAL_COMMUNITY)
Admission: EM | Admit: 2023-10-28 | Discharge: 2023-10-30 | Disposition: A | Attending: Emergency Medicine | Admitting: Emergency Medicine

## 2023-10-28 ENCOUNTER — Emergency Department (HOSPITAL_COMMUNITY)

## 2023-10-28 ENCOUNTER — Other Ambulatory Visit: Payer: Self-pay

## 2023-10-28 ENCOUNTER — Observation Stay (HOSPITAL_COMMUNITY)

## 2023-10-28 DIAGNOSIS — N400 Enlarged prostate without lower urinary tract symptoms: Secondary | ICD-10-CM | POA: Insufficient documentation

## 2023-10-28 DIAGNOSIS — G319 Degenerative disease of nervous system, unspecified: Secondary | ICD-10-CM | POA: Diagnosis not present

## 2023-10-28 DIAGNOSIS — E871 Hypo-osmolality and hyponatremia: Secondary | ICD-10-CM | POA: Diagnosis present

## 2023-10-28 DIAGNOSIS — I480 Paroxysmal atrial fibrillation: Secondary | ICD-10-CM | POA: Diagnosis not present

## 2023-10-28 DIAGNOSIS — G9340 Encephalopathy, unspecified: Secondary | ICD-10-CM | POA: Diagnosis not present

## 2023-10-28 DIAGNOSIS — Z87891 Personal history of nicotine dependence: Secondary | ICD-10-CM | POA: Diagnosis not present

## 2023-10-28 DIAGNOSIS — R2990 Unspecified symptoms and signs involving the nervous system: Principal | ICD-10-CM

## 2023-10-28 DIAGNOSIS — Z7901 Long term (current) use of anticoagulants: Secondary | ICD-10-CM | POA: Diagnosis not present

## 2023-10-28 DIAGNOSIS — R9431 Abnormal electrocardiogram [ECG] [EKG]: Secondary | ICD-10-CM | POA: Diagnosis not present

## 2023-10-28 DIAGNOSIS — F1090 Alcohol use, unspecified, uncomplicated: Secondary | ICD-10-CM | POA: Diagnosis not present

## 2023-10-28 DIAGNOSIS — Z23 Encounter for immunization: Secondary | ICD-10-CM | POA: Insufficient documentation

## 2023-10-28 DIAGNOSIS — R4689 Other symptoms and signs involving appearance and behavior: Secondary | ICD-10-CM

## 2023-10-28 DIAGNOSIS — G3184 Mild cognitive impairment, so stated: Secondary | ICD-10-CM | POA: Diagnosis present

## 2023-10-28 DIAGNOSIS — E876 Hypokalemia: Secondary | ICD-10-CM | POA: Insufficient documentation

## 2023-10-28 DIAGNOSIS — K50012 Crohn's disease of small intestine with intestinal obstruction: Secondary | ICD-10-CM | POA: Diagnosis not present

## 2023-10-28 DIAGNOSIS — R2681 Unsteadiness on feet: Secondary | ICD-10-CM | POA: Diagnosis not present

## 2023-10-28 DIAGNOSIS — R4701 Aphasia: Secondary | ICD-10-CM | POA: Diagnosis present

## 2023-10-28 DIAGNOSIS — Z79899 Other long term (current) drug therapy: Secondary | ICD-10-CM | POA: Insufficient documentation

## 2023-10-28 DIAGNOSIS — R29818 Other symptoms and signs involving the nervous system: Secondary | ICD-10-CM | POA: Diagnosis not present

## 2023-10-28 DIAGNOSIS — K5090 Crohn's disease, unspecified, without complications: Secondary | ICD-10-CM | POA: Diagnosis not present

## 2023-10-28 DIAGNOSIS — D696 Thrombocytopenia, unspecified: Secondary | ICD-10-CM | POA: Insufficient documentation

## 2023-10-28 DIAGNOSIS — I672 Cerebral atherosclerosis: Secondary | ICD-10-CM | POA: Diagnosis not present

## 2023-10-28 DIAGNOSIS — Z8673 Personal history of transient ischemic attack (TIA), and cerebral infarction without residual deficits: Secondary | ICD-10-CM | POA: Diagnosis not present

## 2023-10-28 DIAGNOSIS — G459 Transient cerebral ischemic attack, unspecified: Secondary | ICD-10-CM | POA: Diagnosis not present

## 2023-10-28 DIAGNOSIS — I6523 Occlusion and stenosis of bilateral carotid arteries: Secondary | ICD-10-CM | POA: Diagnosis not present

## 2023-10-28 DIAGNOSIS — D649 Anemia, unspecified: Secondary | ICD-10-CM | POA: Insufficient documentation

## 2023-10-28 DIAGNOSIS — I6782 Cerebral ischemia: Secondary | ICD-10-CM | POA: Diagnosis not present

## 2023-10-28 LAB — COMPREHENSIVE METABOLIC PANEL WITH GFR
ALT: 12 U/L (ref 0–44)
AST: 16 U/L (ref 15–41)
Albumin: 3.3 g/dL — ABNORMAL LOW (ref 3.5–5.0)
Alkaline Phosphatase: 53 U/L (ref 38–126)
Anion gap: 9 (ref 5–15)
BUN: 14 mg/dL (ref 8–23)
CO2: 25 mmol/L (ref 22–32)
Calcium: 8.7 mg/dL — ABNORMAL LOW (ref 8.9–10.3)
Chloride: 96 mmol/L — ABNORMAL LOW (ref 98–111)
Creatinine, Ser: 0.96 mg/dL (ref 0.61–1.24)
GFR, Estimated: >60 mL/min (ref >60)
Glucose, Bld: 98 mg/dL (ref 70–99)
Potassium: 4 mmol/L (ref 3.5–5.1)
Sodium: 130 mmol/L — ABNORMAL LOW (ref 135–145)
Total Bilirubin: 1.5 mg/dL — ABNORMAL HIGH (ref 0.0–1.2)
Total Protein: 6.3 g/dL — ABNORMAL LOW (ref 6.5–8.1)

## 2023-10-28 LAB — CBC
HCT: 31 % — ABNORMAL LOW (ref 39.0–52.0)
Hemoglobin: 10.2 g/dL — ABNORMAL LOW (ref 13.0–17.0)
MCH: 29 pg (ref 26.0–34.0)
MCHC: 32.9 g/dL (ref 30.0–36.0)
MCV: 88.1 fL (ref 80.0–100.0)
Platelets: 131 K/uL — ABNORMAL LOW (ref 150–400)
RBC: 3.52 MIL/uL — ABNORMAL LOW (ref 4.22–5.81)
RDW: 19.9 % — ABNORMAL HIGH (ref 11.5–15.5)
WBC: 7 K/uL (ref 4.0–10.5)
nRBC: 0 % (ref 0.0–0.2)

## 2023-10-28 LAB — DIFFERENTIAL
Abs Immature Granulocytes: 0.22 K/uL — ABNORMAL HIGH (ref 0.00–0.07)
Basophils Absolute: 0.1 K/uL (ref 0.0–0.1)
Basophils Relative: 1 %
Eosinophils Absolute: 0.1 K/uL (ref 0.0–0.5)
Eosinophils Relative: 1 %
Immature Granulocytes: 3 %
Lymphocytes Relative: 29 %
Lymphs Abs: 2 K/uL (ref 0.7–4.0)
Monocytes Absolute: 0.6 K/uL (ref 0.1–1.0)
Monocytes Relative: 9 %
Neutro Abs: 4.1 K/uL (ref 1.7–7.7)
Neutrophils Relative %: 57 %

## 2023-10-28 LAB — I-STAT CHEM 8, ED
BUN: 15 mg/dL (ref 8–23)
Calcium, Ion: 1.16 mmol/L (ref 1.15–1.40)
Chloride: 95 mmol/L — ABNORMAL LOW (ref 98–111)
Creatinine, Ser: 1.1 mg/dL (ref 0.61–1.24)
Glucose, Bld: 94 mg/dL (ref 70–99)
HCT: 31 % — ABNORMAL LOW (ref 39.0–52.0)
Hemoglobin: 10.5 g/dL — ABNORMAL LOW (ref 13.0–17.0)
Potassium: 4.1 mmol/L (ref 3.5–5.1)
Sodium: 131 mmol/L — ABNORMAL LOW (ref 135–145)
TCO2: 24 mmol/L (ref 22–32)

## 2023-10-28 LAB — CBG MONITORING, ED: Glucose-Capillary: 75 mg/dL (ref 70–99)

## 2023-10-28 LAB — ETHANOL: Alcohol, Ethyl (B): <15 mg/dL (ref <15)

## 2023-10-28 LAB — PROTIME-INR
INR: 1.3 — ABNORMAL HIGH (ref 0.8–1.2)
Prothrombin Time: 16.6 s — ABNORMAL HIGH (ref 11.4–15.2)

## 2023-10-28 LAB — APTT: aPTT: 40 s — ABNORMAL HIGH (ref 24–36)

## 2023-10-28 MED ORDER — SODIUM CHLORIDE 0.9% FLUSH
3.0000 mL | Freq: Once | INTRAVENOUS | Status: AC
  Administered 2023-10-28: 3 mL via INTRAVENOUS

## 2023-10-28 MED ORDER — IOHEXOL 350 MG/ML SOLN
75.0000 mL | Freq: Once | INTRAVENOUS | Status: AC | PRN
  Administered 2023-10-28: 75 mL via INTRAVENOUS

## 2023-10-28 MED ORDER — NETARSUDIL DIMESYLATE 0.02 % OP SOLN: 1.0000 [drp] | Freq: Once | OPHTHALMIC | Status: DC

## 2023-10-28 MED ORDER — DORZOLAMIDE HCL-TIMOLOL MAL 2-0.5 % OP SOLN: 1.0000 [drp] | Freq: Once | OPHTHALMIC | Status: DC

## 2023-10-28 MED ORDER — LEVETIRACETAM (KEPPRA) 500 MG/5 ML ADULT IV PUSH
3000.0000 mg | INTRAVENOUS | Status: AC
  Administered 2023-10-28: 3000 mg via INTRAVENOUS
  Filled 2023-10-28: qty 30

## 2023-10-28 MED ORDER — LATANOPROST 0.005 % OP SOLN: 1.0000 [drp] | Freq: Once | OPHTHALMIC | Status: DC

## 2023-10-28 NOTE — Code Documentation (Signed)
 Stroke Response Nurse Documentation Code Documentation  Michael [name] is a 89 y.o. male arriving to [facility]  via wheelchair from [facility] on 10/28/2023 with past medical hx of a fib on eliquis , crohns disease, glaucoma, TIA, memory impairments, B12 deficiency. On Eliquis  (apixaban ) daily. Code stroke was activated by ED.   Patient from 3W where he was LKW at 1500 and now complaining of ahpasia. Per daughter, the patient was on 3W visiting his wife. He was watching the ipad when he suddenly looked to the right, and started picking his hands with his fingers. RRT was called and the patient was brought to the ED where a code stroke was activated.  Stroke team at the bedside on patient arrival. Labs drawn and patient cleared for CT by Dr. Doretha. Patient to CT with team. NIHSS 2, see documentation for details and code stroke times. Patient with disoriented and Expressive aphasia  on exam. The following imaging was completed:  CT Head. Patient is not a candidate for IV Thrombolytic due to low NIH. Patient is not a candidate for IR due to no LVO noted on imaging.   Care Plan: VS/NIHSS q30 min until out of window at 1930, then q2hr x12hr, then q4hr.   Bedside handoff with ED RN [place].    Annabella DELENA Bame  Stroke Response RN

## 2023-10-28 NOTE — Consult Note (Addendum)
 NEUROLOGY CONSULT NOTE   Date of service: October 28, 2023 Patient Name: Michael [name] MRN:  [id number] DOB:  [birth year] Chief Complaint: Code stroke  Requesting Provider: Doretha Folks, MD  History of Present Illness  Michael [name] is a 89 y.o. male with hx of A fib on Eliquis , glaucoma, TIAs, memory impairments, crohns disease, B12 deficiency who was visiting his wife with his daughter at the bedside on 3W. LKW 1500. He was watching a volleyball game with his daughter when he acutely had a change. Per the daughter, He suddenly turned his head to the right and started picking  with his fingers and seeming to get agitated and was not speaking. RRT was called and patient was brought to the ED. Code stroke was activated. CT head with Acute ischemic changes in the right frontal lobe with loss of gray-white differentiation. NIHSS 2   As per daughter patient has history of multiple TIA's, all with presentation of aphasia.   LKW: 1500 Modified rankin score: 3-Moderate disability-requires help but walks WITHOUT assistance IV Thrombolysis:  No low NIHSS  EVT:  No LVO   NIHSS components Score: Comment  1a Level of Conscious 0[]  1[]  2[]  3[]      1b LOC Questions 0[]  1[x]  2[]       1c LOC Commands 0[]  1[]  2[]       2 Best Gaze 0[]  1[]  2[]       3 Visual 0[]  1[]  2[]  3[]      4 Facial Palsy 0[]  1[]  2[]  3[]      5a Motor Arm - left 0[]  1[]  2[]  3[]  4[]  UN[]    5b Motor Arm - Right 0[]  1[]  2[]  3[]  4[]  UN[]    6a Motor Leg - Left 0[]  1[]  2[]  3[]  4[]  UN[]    6b Motor Leg - Right 0[]  1[]  2[]  3[]  4[]  UN[]    7 Limb Ataxia 0[]  1[]  2[]  UN[]      8 Sensory 0[]  1[]  2[]  UN[]      9 Best Language 0[]  1[x]  2[]  3[]      10 Dysarthria 0[]  1[]  2[]  UN[]      11 Extinct. and Inattention 0[]  1[]  2[]       TOTAL: 2      ROS   Comprehensive ROS Unable to ascertain due to AMS  Past History   Past Medical History:  Diagnosis Date   Adrenal insufficiency    Anemia    Atrial fibrillation (HCC)    Avascular  necrosis (HCC)    Clavicle fracture 11/02/2017   Colon polyp    Colon polyps    Crohn's disease (HCC)    Glaucoma    Inguinal hernia recurrent bilateral    Low testosterone     Osteopenia    Retinal ischemia     Past Surgical History:  Procedure Laterality Date   ABDOMINAL SURGERY     BIOPSY  03/19/2020   Procedure: BIOPSY;  Surgeon: Leigh Elspeth SQUIBB, MD;  Location: [facility];  Service: Gastroenterology;;   COLONOSCOPY N/A 12/13/2015   Procedure: COLONOSCOPY;  Surgeon: Lynwood Bohr, MD;  Location: [facility];  Service: Endoscopy;  Laterality: N/A;   COLONOSCOPY WITH PROPOFOL  N/A 03/25/2019   Procedure: COLONOSCOPY WITH PROPOFOL ;  Surgeon: Leigh Elspeth SQUIBB, MD;  Location: [facility];  Service: Gastroenterology;  Laterality: N/A;   COLONOSCOPY WITH PROPOFOL  N/A 08/26/2019   Procedure: COLONOSCOPY WITH PROPOFOL ;  Surgeon: Leigh Elspeth SQUIBB, MD;  Location: [facility];  Service: Gastroenterology;  Laterality: N/A;   COLONOSCOPY WITH PROPOFOL  N/A 03/19/2020   Procedure: COLONOSCOPY WITH PROPOFOL ;  Surgeon: Leigh Elspeth SQUIBB, MD;  Location: [facility];  Service: Gastroenterology;  Laterality: N/A;   ENDOSCOPIC MUCOSAL RESECTION N/A 08/26/2019   Procedure: ENDOSCOPIC MUCOSAL RESECTION;  Surgeon: Leigh Elspeth SQUIBB, MD;  Location: [facility];  Service: Gastroenterology;  Laterality: N/A;   INGUINAL HERNIA REPAIR Bilateral 05/21/2014   Procedure: OPEN BILATERAL INGUINAL HERNIA REPAIRS WITH MESH;  Surgeon: Donnice Lima, MD;  Location:  [facility];  Service: General;  Laterality: Bilateral;   POLYPECTOMY  03/25/2019   Procedure: POLYPECTOMY;  Surgeon: Leigh Elspeth SQUIBB, MD;  Location: [facility];  Service: Gastroenterology;;   POLYPECTOMY  08/26/2019   Procedure: POLYPECTOMY;  Surgeon: Leigh Elspeth SQUIBB, MD;  Location: [facility];  Service: Gastroenterology;;   POLYPECTOMY  03/19/2020   Procedure: POLYPECTOMY;  Surgeon: Leigh Elspeth SQUIBB, MD;   Location: [facility];  Service: Gastroenterology;;   WALDEMAR  08/26/2019   Procedure: Sprayed methylene blue ;  Surgeon: Leigh Elspeth SQUIBB, MD;  Location: [facility];  Service: Gastroenterology;;   SMALL INTESTINE SURGERY  1968, 2001    related to Chrohn's disease   TONSILLECTOMY      Family History: Family History  Problem Relation Age of Onset   Deep vein thrombosis Mother    Transient ischemic attack Mother    Heart disease Father    Cancer Maternal Aunt        unknown type; dx after 82   Cancer Cousin        maternal cousin; unknown type   Cancer Cousin        paternal cousin; unknown type; dx after 50   Stomach cancer Neg Hx    Colon cancer Neg Hx    Esophageal cancer Neg Hx    Pancreatic cancer Neg Hx    Rectal cancer Neg Hx    Colon polyps Neg Hx     Social History  reports that he quit smoking about 47 years ago. His smoking use included cigarettes. He has never used smokeless tobacco. He reports current alcohol use of about 2.0 standard drinks of alcohol per week. He reports that he does not use drugs.  No Known Allergies  Medications   Current Facility-Administered Medications:    sodium chloride  flush (NS) 0.9 % injection 3 mL, 3 mL, Intravenous, Once, Doretha Folks, MD  Current Outpatient Medications:    acetaminophen  (TYLENOL ) 500 MG tablet, Take 500-1,000 mg by mouth every 8 (eight) hours as needed for mild pain or headache. , Disp: , Rfl:    alendronate  (FOSAMAX ) 70 MG tablet, Take 1 tablet (70 mg total) by mouth every 7 (seven) days. Take with a full glass of water  on an empty stomach., Disp: 12 tablet, Rfl: 3   apixaban  (ELIQUIS ) 5 MG TABS tablet, Take 1 tablet (5 mg total) by mouth 2 (two) times daily., Disp: 60 tablet, Rfl: 5   Ascorbic Acid  (VITAMIN C ) 1000 MG tablet, Take 1,000 mg by mouth daily with breakfast. , Disp: , Rfl:    augmented betamethasone dipropionate (DIPROLENE-AF) 0.05 % cream, Apply 1 application topically 2 (two) times  daily as needed (Grover's disease). , Disp: , Rfl:    B-D 3CC LUER-LOK SYR 25GX1 25G X 1 3 ML MISC, USE MONTHLY FOR B-12 INJECTIONS, Disp: 12 each, Rfl: 0   budesonide  (ENTOCORT EC ) 3 MG 24 hr capsule, AS NEEDED FOR FLARETake 3 capsules (9 mg total) by mouth daily, THEN 2 capsules (6 mg total) daily, THEN 1 capsule (3 mg total) daily. AS NEEDED FOR FLARE., Disp: , Rfl:  Cholecalciferol  (VITAMIN D3) 50 MCG (2000 UT) TABS, Take 2,000 Units by mouth daily with breakfast., Disp: , Rfl:    cyanocobalamin  (VITAMIN B12) 1000 MCG/ML injection, INJECT 1ML INTO THE MUSCLE ONCE MONTHLY, Disp: 10 mL, Rfl: 0   desmopressin  (DDAVP ) 0.1 MG tablet, Take 100 mcg by mouth daily., Disp: , Rfl:    dorzolamide -timolol  (COSOPT ) 22.3-6.8 MG/ML ophthalmic solution, Place 1 drop into the left eye 2 (two) times daily., Disp: , Rfl:    latanoprost  (XALATAN ) 0.005 % ophthalmic solution, Place 1 drop into the left eye at bedtime., Disp: , Rfl:    loperamide  (IMODIUM  A-D) 2 MG tablet, Take 2 mg by mouth daily as needed for diarrhea or loose stools., Disp: , Rfl:    metoprolol  succinate (TOPROL -XL) 25 MG 24 hr tablet, TAKE HALF TABLET BY MOUTH DAILY, Disp: 45 tablet, Rfl: 3   Netarsudil  Dimesylate 0.02 % SOLN, Place 1 drop into the left eye every evening., Disp: , Rfl:    omeprazole  (PRILOSEC  OTC) 20 MG tablet, Take 20 mg by mouth daily before breakfast., Disp: , Rfl:    tamsulosin  (FLOMAX ) 0.4 MG CAPS capsule, Take 0.4 mg by mouth at bedtime., Disp: , Rfl:   Vitals   Vitals:   2023/11/10 1537  BP: (!) 145/96    There is no height or weight on file to calculate BMI.   Physical Exam   Constitutional: Appears well-developed and well-nourished.   Psych: Affect appropriate to situation.   Eyes: No scleral injection.   HENT: No OP obstruction.   Head: Normocephalic.   Cardiovascular: Normal rate and regular rhythm.   Respiratory: Effort normal, non-labored breathing.   GI: Soft.  No distension. There is no tenderness.    Skin: WDI.    Neurologic Examination   Mental Status -  Confused stated age as 37 stated correct month October After CT was able to answer orientation questions correctly.  Mild aphasia could follow simple commands only   Cranial Nerves II - XII - II - Visual field intact OU . III, IV, VI - Extraocular movements intact . V - Facial sensation intact bilaterally . VII - Facial movement intact bilaterally . VIII - Hearing & vestibular intact bilaterally . X - Palate elevates symmetrically . XI - Chin turning & shoulder shrug intact bilaterally . XII - Tongue protrusion intact .  Motor Strength - The patient's strength was normal in all extremities and pronator drift was absent.  Bulk was normal and fasciculations were absent .   Motor Tone - Muscle tone was assessed at the neck and appendages and was normal . Sensory - Light touch, temperature/pinprick were assessed and were symmetrical.   Coordination - Unable to assess due to inability to understand directions  Gait and Station - deferred.  Re-examination @ 6pm: Patient no longer with difficulty speaking. Was able to recall events of what happened.   Labs/Imaging/Neurodiagnostic studies   CBC:  Recent Labs  Lab Nov 10, 2023 1615  WBC 7.0  NEUTROABS 4.1  HGB 10.2*  10.5*  HCT 31.0*  31.0*  MCV 88.1  PLT 131*   Basic Metabolic Panel:  Lab Results  Component Value Date   NA 131 (L) 2023-11-10   K 4.1 11-10-2023   CO2 30 10/02/2023   GLUCOSE 94 2023/11/10   BUN 15 2023/11/10   CREATININE 1.10 10-Nov-2023   CALCIUM  8.6 10/02/2023   GFRNONAA >60 09/22/2023   GFRAA >60 09/11/2019   Lipid Panel:  Lab Results  Component Value Date  LDLCALC 32 02/21/2023   HgbA1c:  Lab Results  Component Value Date   HGBA1C 5.6 01/14/2021   Urine Drug Screen:     Component Value Date/Time   LABOPIA NONE DETECTED 09/23/2020 1045   COCAINSCRNUR NONE DETECTED 09/23/2020 1045   LABBENZ NONE DETECTED 09/23/2020 1045    AMPHETMU NONE DETECTED 09/23/2020 1045   THCU NONE DETECTED 09/23/2020 1045   LABBARB NONE DETECTED 09/23/2020 1045    Alcohol Level     Component Value Date/Time   ETH <10 10/26/2021 1615   INR  Lab Results  Component Value Date   INR 1.3 (H) 10/28/2023   APTT  Lab Results  Component Value Date   APTT 40 (H) 10/28/2023   AED levels: No results found for: PHENYTOIN, ZONISAMIDE, LAMOTRIGINE, LEVETIRACETA  CT Head without contrast(Personally reviewed): Acute ischemic changes in the right frontal lobe with loss of gray-white differentiation.    ASSESSMENT   Michael [name] is a 89 y.o. male hx of A fib on Eliquis , crohns disease, glaucoma, TIA, memory impairments, B12 deficiency who was visiting his wife with his daughter at the bedside on 3W. LKW 1500. He was watching a volleyball game with his daughter when he acutely had a change. Per the daughter, He suddenly turned his head to the right and started picking  with his fingers and seeming to get agitated and was not speaking.  RECOMMENDATIONS  CTA head and neck ordered to evaluate for intracranial stenosis to explain recurrent stereotyped TIA MRI brain to follow-up on changes seen on CTH LTM EEG to evaluate for seizures  Further recommendations to follow after above tests are completed  Neurology will continue to follow  ______________________________________________________________________    Signed, Karna DELENA Geralds, NP [facility]  NEUROHOSPITALIST ADDENDUM Performed a face to face diagnostic evaluation.   I have reviewed the contents of history and physical exam as documented by PA/ARNP/Resident and agree with above documentation.  I have discussed and formulated the above plan as documented. Edits to the note have been made as needed.  Janai Brannigan, MD [facility]

## 2023-10-28 NOTE — ED Triage Notes (Signed)
 Pt experienced stroke like symptoms while visiting with family member.

## 2023-10-28 NOTE — ED Provider Notes (Signed)
 [place] EMERGENCY DEPARTMENT AT [facility] Provider Note   CSN: [id number] Arrival date & time: 10/28/23  1520  An emergency department physician performed an initial assessment on this suspected stroke patient at 1522.  Patient presents with: No chief complaint on file.   Michael [name] is a 89 y.o. male.   Patient is an 89 year old male with a history of Crohn's disease, anemia, glaucoma, atrial fibrillation on Eliquis  who is presenting today as a code stroke.  Patient was last seen normal about 3:00 today.  He is currently visiting the hospital because his wife has cancer and they were upstairs watching a volleyball game.  His daughter who is a nurse reports that he suddenly started staring to the right and getting very fidgety.  He suddenly seemed confused and could not answer her questions appropriately with some slurred speech.  This did not resolve and she made the staff aware and he was brought to the emergency room.  Patient is able to say his name and denies any pain.  He Nuys feeling short of breath or having any nausea.  His daughter denied seeing any seizure-like activity and he has no prior history of seizures.  She did report in the past that maybe he has had TIAs but nothing that ever lasted this long.  He does not carry a history of dementia and his daughter reports that he still lives at home alone but since his wife no longer lives there in the last few months she has noticed he is not quite as sharp as he used to be.  No recent medication changes.  No recent illnesses and he did not complain of anything earlier today.  The history is provided by the patient and a relative.       Prior to Admission medications   Medication Sig Start Date End Date Taking? Authorizing Provider  acetaminophen  (TYLENOL ) 500 MG tablet Take 500-1,000 mg by mouth every 8 (eight) hours as needed for mild pain or headache.     [provider]  alendronate  (FOSAMAX ) 70 MG  tablet Take 1 tablet (70 mg total) by mouth every 7 (seven) days. Take with a full glass of water  on an empty stomach. 03/20/23   [name], MD  apixaban  (ELIQUIS ) 5 MG TABS tablet Take 1 tablet (5 mg total) by mouth 2 (two) times daily. 09/06/21   Verlin Lonni BIRCH, MD  Ascorbic Acid  (VITAMIN C ) 1000 MG tablet Take 1,000 mg by mouth daily with breakfast.     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  10/03/17   [provider]  B-D 3CC LUER-LOK SYR 25GX1 25G X 1 3 ML MISC USE MONTHLY FOR B-12 INJECTIONS 01/13/23   Burns, Glade PARAS, MD  budesonide  (ENTOCORT EC ) 3 MG 24 hr capsule AS NEEDED FOR FLARETake 3 capsules (9 mg total) by mouth daily, THEN 2 capsules (6 mg total) daily, THEN 1 capsule (3 mg total) daily. AS NEEDED FOR FLARE. 11/28/22   [name], MD  Cholecalciferol  (VITAMIN D3) 50 MCG (2000 UT) TABS Take 2,000 Units by mouth daily with breakfast.    [provider]  cyanocobalamin  (VITAMIN B12) 1000 MCG/ML injection INJECT 1ML INTO THE MUSCLE ONCE MONTHLY 09/27/23   [name], MD  desmopressin  (DDAVP ) 0.1 MG tablet Take 100 mcg by mouth daily. 04/30/22   [provider]  dorzolamide -timolol  (COSOPT )  22.3-6.8 MG/ML ophthalmic solution Place 1 drop into the left eye 2 (two) times daily. 11/05/15   [provider]  latanoprost  (XALATAN ) 0.005 % ophthalmic solution Place 1 drop into the left eye at bedtime. 09/12/17   [provider]  loperamide  (IMODIUM  A-D) 2 MG tablet Take 2 mg by mouth daily as needed for diarrhea or loose stools.    [provider]  metoprolol  succinate (TOPROL -XL) 25 MG 24 hr tablet TAKE HALF TABLET BY MOUTH DAILY 02/16/23   Verlin Lonni BIRCH, MD  Netarsudil  Dimesylate 0.02 % SOLN Place 1 drop into the left eye every evening.    [provider]  omeprazole  (PRILOSEC  OTC)  20 MG tablet Take 20 mg by mouth daily before breakfast.    [provider]  tamsulosin  (FLOMAX ) 0.4 MG CAPS capsule Take 0.4 mg by mouth at bedtime. 12/20/19   [provider]    Allergies: Patient has no known allergies.    Review of Systems  Updated Vital Signs BP (!) 171/91   Pulse 63   Temp 97.8 F (36.6 C)   Resp 17   Ht 5' 11 (1.803 m)   Wt 78.9 kg   SpO2 99%   BMI 24.27 kg/m   Physical Exam Vitals and nursing note reviewed.  Constitutional:      General: He is not in acute distress.    Appearance: He is well-developed.  HENT:     Head: Normocephalic and atraumatic.     Comments: Mild erythema of the left conjunctiva.  Pupils are reactive Eyes:     Conjunctiva/sclera: Conjunctivae normal.     Pupils: Pupils are equal, round, and reactive to light.  Cardiovascular:     Rate and Rhythm: Normal rate and regular rhythm.     Heart sounds: No murmur heard. Pulmonary:     Effort: Pulmonary effort is normal. No respiratory distress.     Breath sounds: Normal breath sounds. No wheezing or rales.  Abdominal:     General: There is no distension.     Palpations: Abdomen is soft.     Tenderness: There is no abdominal tenderness. There is no guarding or rebound.  Musculoskeletal:        General: No tenderness. Normal range of motion.     Cervical back: Normal range of motion and neck supple.     Right lower leg: No edema.     Left lower leg: No edema.  Skin:    General: Skin is warm and dry.     Findings: No erythema or rash.  Neurological:     Mental Status: He is alert.     Cranial Nerves: No cranial nerve deficit.     Sensory: No sensory deficit.     Motor: No weakness.     Comments: Unable to give his birthdate but does know his name and where he is at  Psychiatric:        Behavior: Behavior normal.     (all labs ordered are listed, but only abnormal results are displayed) Labs Reviewed  PROTIME-INR - Abnormal; Notable for the following  components:      Result Value   Prothrombin Time 16.6 (*)    INR 1.3 (*)    All other components within normal limits  APTT - Abnormal; Notable for the following components:   aPTT 40 (*)    All other components within normal limits  CBC - Abnormal; Notable for the following components:   RBC 3.52 (*)  Hemoglobin 10.2 (*)    HCT 31.0 (*)    RDW 19.9 (*)    Platelets 131 (*)    All other components within normal limits  DIFFERENTIAL - Abnormal; Notable for the following components:   Abs Immature Granulocytes 0.22 (*)    All other components within normal limits  COMPREHENSIVE METABOLIC PANEL WITH GFR - Abnormal; Notable for the following components:   Sodium 130 (*)    Chloride 96 (*)    Calcium  8.7 (*)    Total Protein 6.3 (*)    Albumin 3.3 (*)    Total Bilirubin 1.5 (*)    All other components within normal limits  I-STAT CHEM 8, ED - Abnormal; Notable for the following components:   Sodium 131 (*)    Chloride 95 (*)    Hemoglobin 10.5 (*)    HCT 31.0 (*)    All other components within normal limits  ETHANOL  CBG MONITORING, ED    EKG: EKG Interpretation Date/Time:  Saturday October 28 2023 17:52:02 EDT Ventricular Rate:  63 PR Interval:  150 QRS Duration:  139 QT Interval:  432 QTC Calculation: 443 R Axis:   266  Text Interpretation: Sinus rhythm Right bundle branch block Inferior infarct, age indeterminate Lateral leads are also involved No significant change since last tracing Confirmed by Doretha Folks ([id number]) on 10/28/2023 7:56:38 PM  Radiology: CT ANGIO HEAD NECK W WO CM Result Date: 10/28/2023 EXAM: CTA Head and Neck with Intravenous Contrast. CT Head without Contrast. CLINICAL HISTORY: Transient ischemic attack (TIA). TECHNIQUE: Axial CTA images of the head and neck performed with intravenous contrast. MIP reconstructed images were created and reviewed. Axial computed tomography images of the head/brain performed without intravenous contrast. 75 mL  iohexol  (OMNIPAQUE ) 350 MG/ML injection. Note: Per PQRS, the description of internal carotid artery percent stenosis, including 0 percent or normal exam, is based on [place] American Symptomatic Carotid Endarterectomy Trial (NASCET) criteria. Dose reduction technique was used including one or more of the following: automated exposure control, adjustment of mA and kV according to patient size, and/or iterative reconstruction. CONTRAST: With and without IV contrast; 75 mL iohexol  (OMNIPAQUE ) 350 MG/ML injection. COMPARISON: CT from earlier in the same day. FINDINGS: CT HEAD: BRAIN: No acute intraparenchymal hemorrhage. No mass lesion. No CT evidence for acute territorial infarct. No midline shift or extra-axial collection. Age-related cerebral atrophy with chronic microvascular ischemic disease. Calcified atherosclerosis present about the skull base. EEG leads overlie the scalp. VENTRICLES: No hydrocephalus. ORBITS: The orbits are unremarkable. SINUSES AND MASTOIDS: Changes of chronic right maxillary sinusitis noted. The remaining paranasal sinuses and mastoid air cells are clear. CTA NECK: COMMON CAROTID ARTERIES: Mild atheromatous change about the carotid bulbs bilaterally without hemodynamically significant stenosis. No dissection or occlusion. INTERNAL CAROTID ARTERIES: Mild atheromatous change about the carotid siphons without hemodynamically significant stenosis. No dissection or occlusion. VERTEBRAL ARTERIES: No significant stenosis. No dissection or occlusion. CTA HEAD: ANTERIOR CEREBRAL ARTERIES: No significant stenosis. No occlusion. No aneurysm. MIDDLE CEREBRAL ARTERIES: No significant stenosis. No occlusion. No aneurysm. POSTERIOR CEREBRAL ARTERIES: No significant stenosis. No occlusion. No aneurysm. BASILAR ARTERY: No significant stenosis. No occlusion. No aneurysm. OTHER: SOFT TISSUES: No acute finding. No masses or lymphadenopathy. BONES: Moderate multilevel cervical spondylosis. Osteoarthritic changes  noted about the right TMJ. No acute osseous abnormality. LUNGS: Centrilobular emphysema. Superimposed patchy opacity at the peripheral aspects of both upper lobes. While this may in part reflects scarring, superimposed changes of mild pneumonitis slash small airway disease could be  contributory as well. IMPRESSION: 1. Negative CTA of the head and neck. No large vessel occlusion or other emergent finding. 2. Mild for age atheromatous disease about the carotid bulbs and carotid siphons without hemodynamically significant or correctable stenosis. 3. Negative noncontrast head CT. Previously questioned ischemic changes involving the right ACA distribution not appreciated on this exam. No other new acute intracranial abnormality. 4. Aortic atherosclerosis. Emphysema. 5. Patchy peripheral upper lobe opacities, likely scarring. Possible mild o changes needed pneumonitis or small airway disease could be considered in the correct clinical setting. Electronically signed by: Morene Hoard MD 10/28/2023 07:50 PM EDT RP Workstation: [id number]   CT HEAD CODE STROKE WO CONTRAST Result Date: 10/28/2023 EXAM: CT HEAD WITHOUT CONTRAST 10/28/2023 03:48:46 PM TECHNIQUE: CT of the head was performed without the administration of intravenous contrast. Automated exposure control, iterative reconstruction, and/or weight based adjustment of the mA/kV was utilized to reduce the radiation dose to as low as reasonably achievable. COMPARISON: CT head 06/02/2022. CLINICAL HISTORY: Neuro deficit, acute, stroke suspected. FINDINGS: BRAIN AND VENTRICLES: No acute hemorrhage. Loss of gray-white differentiation in right frontal lobe. Remote lacunar infarct in left thalamus. Periventricular white matter changes, likely sequela of chronic small vessel ischemic disease. Atherosclerotic calcifications in intracranial carotid and vertebral arteries. No hydrocephalus. No extra-axial collection. No mass effect or midline shift. ORBITS: Bilateral  lens replacements noted. No acute abnormality. SINUSES: Air-fluid level in right maxillary sinus. Remote fracture of right lamina papyracea. SOFT TISSUES AND SKULL: No acute soft tissue abnormality. No skull fracture. [place] stroke program early CT (aspect) score: Ganglionic (caudate, ic, lentiform nucleus, insula, M1-m3): 7 Supraganglionic (m4-m6): 3 Total: 10 IMPRESSION: 1. Acute ischemic changes in the right frontal lobe with loss of gray-white differentiation. Findings concerning for acute cortical infarct in the right ACA territory. 2.  ASPECT score is 10. 3. Chronic small vessel ischemic disease. 4. Remote lacunar infarct in the left thalamus. 5. Findings messaged to Dr. Sallyann at 4:00PM on 10/28/23. Electronically signed by: Donnice Mania MD 10/28/2023 04:01 PM EDT RP Workstation: [id number]     Procedures   Medications Ordered in the ED  dorzolamide -timolol  (COSOPT ) 2-0.5 % ophthalmic solution 1 drop (has no administration in time range)  latanoprost  (XALATAN ) 0.005 % ophthalmic solution 1 drop (has no administration in time range)  Netarsudil  Dimesylate 0.02 % SOLN 1 drop (has no administration in time range)  sodium chloride  flush (NS) 0.9 % injection 3 mL (3 mLs Intravenous Given 10/28/23 1758)  iohexol  (OMNIPAQUE ) 350 MG/ML injection 75 mL (75 mLs Intravenous Contrast Given 10/28/23 1849)                                    Medical Decision Making Amount and/or Complexity of Data Reviewed External Data Reviewed: notes. Labs: ordered. Decision-making details documented in ED Course. Radiology: ordered and independent interpretation performed. Decision-making details documented in ED Course. ECG/medicine tests: ordered and independent interpretation performed. Decision-making details documented in ED Course.  Risk Prescription drug management.   Pt with multiple medical problems and comorbidities and presenting today with a complaint that caries a high risk for morbidity and  mortality.  Here today as a code stroke for sudden onset of confusion.  Concern for possible seizure versus stroke versus head bleed.  Lower suspicion for infectious etiology or metabolic encephalopathy.  Will rule out hypoglycemia or electrolyte abnormality.  8:59 PM I have independently visualized and interpreted pt's images today.  CT of the head without evidence of acute bleed.  However radiology reports this acute ischemic changes in the right frontal lobe with loss of gray-white differentiation concerning for an acute cortical infarct.  On repeat evaluation patient is still minimally confused but no other focal findings.  Spoke with Dr. SALLYANN who reports patient is not a TNKase candidate as he is on Eliquis  and NIH is low and symptoms are too mild to treat for thrombectomy.  She would like to get an MRI for further evaluation. I independently interpreted patient's labs and EKG today.  EKG without acute findings, INR normal at 1.3, PTT without acute findings, CBC with stable hemoglobin of 10.2 and normal white count CMP with mild hyponatremia of 130 but normal renal function and creatinine.  Alcohol is undetectable.  On CTA of the head radiology reports negative for large vessel occlusion or emergent finding they did see some patchy peripheral upper lobe opacity likely scarring.  MRI is pending.       Final diagnoses:  None    ED Discharge Orders     None          Doretha Folks, MD 10/28/23 2059

## 2023-10-28 NOTE — ED Notes (Addendum)
 Wife stepped out of room saying that pt needed to use the restroom. This NT and Amber, NT went into pt room to assist with using the bedpan. Upon entering the room both techs were told to get out by pt because he had to piss

## 2023-10-28 NOTE — Consult Note (Cosign Needed)
 NEUROLOGY CONSULT NOTE   Date of service: October 28, 2023 Patient Name: Michael [name] MRN:  [id number] DOB:  1932-12-16 Chief Complaint: Code stroke  Requesting Provider: Doretha Folks, MD  History of Present Illness  Michael [name] is a 89 y.o. male with hx of A fib on Eliquis , crohns disease, glaucoma, TIA, memory impairments, B12 deficiency who was visiting his wife with his daughter at the bedside on 3W. LKW 1500. He was watching a volleyball game with his daughter when he acutely had a change. Per the daughter, He suddenly turned his head to the right and started picking  with his fingers and seeming to get agitated and was not speaking. RRT was called and patient was brought to the ED. Code stroke was activated. CT head with no acute process. NIHSS 2   LKW: 1500 Modified rankin score: 3-Moderate disability-requires help but walks WITHOUT assistance IV Thrombolysis:  No low NIHSS  EVT:  No LVO   NIHSS components Score: Comment  1a Level of Conscious 0[]  1[]  2[]  3[]      1b LOC Questions 0[]  1[x]  2[]       1c LOC Commands 0[]  1[]  2[]       2 Best Gaze 0[]  1[]  2[]       3 Visual 0[]  1[]  2[]  3[]      4 Facial Palsy 0[]  1[]  2[]  3[]      5a Motor Arm - left 0[]  1[]  2[]  3[]  4[]  UN[]    5b Motor Arm - Right 0[]  1[]  2[]  3[]  4[]  UN[]    6a Motor Leg - Left 0[]  1[]  2[]  3[]  4[]  UN[]    6b Motor Leg - Right 0[]  1[]  2[]  3[]  4[]  UN[]    7 Limb Ataxia 0[]  1[]  2[]  UN[]      8 Sensory 0[]  1[]  2[]  UN[]      9 Best Language 0[]  1[x]  2[]  3[]      10 Dysarthria 0[]  1[]  2[]  UN[]      11 Extinct. and Inattention 0[]  1[]  2[]       TOTAL: 2      ROS   Comprehensive ROS Unable to ascertain due to AMS  Past History   Past Medical History:  Diagnosis Date   Adrenal insufficiency    Anemia    Atrial fibrillation (HCC)    Avascular necrosis (HCC)    Clavicle fracture 11/02/2017   Colon polyp    Colon polyps    Crohn's disease (HCC)    Glaucoma    Inguinal hernia recurrent bilateral    Low  testosterone     Osteopenia    Retinal ischemia     Past Surgical History:  Procedure Laterality Date   ABDOMINAL SURGERY     BIOPSY  03/19/2020   Procedure: BIOPSY;  Surgeon: Leigh Elspeth SQUIBB, MD;  Location: [facility];  Service: Gastroenterology;;   COLONOSCOPY N/A 12/13/2015   Procedure: COLONOSCOPY;  Surgeon: Lynwood Bohr, MD;  Location: [facility];  Service: Endoscopy;  Laterality: N/A;   COLONOSCOPY WITH PROPOFOL  N/A 03/25/2019   Procedure: COLONOSCOPY WITH PROPOFOL ;  Surgeon: Leigh Elspeth SQUIBB, MD;  Location: [facility];  Service: Gastroenterology;  Laterality: N/A;   COLONOSCOPY WITH PROPOFOL  N/A 08/26/2019   Procedure: COLONOSCOPY WITH PROPOFOL ;  Surgeon: Leigh Elspeth SQUIBB, MD;  Location: [facility];  Service: Gastroenterology;  Laterality: N/A;   COLONOSCOPY WITH PROPOFOL  N/A 03/19/2020   Procedure: COLONOSCOPY WITH PROPOFOL ;  Surgeon: Leigh Elspeth SQUIBB, MD;  Location: [facility];  Service: Gastroenterology;  Laterality: N/A;   ENDOSCOPIC MUCOSAL RESECTION N/A 08/26/2019   Procedure:  ENDOSCOPIC MUCOSAL RESECTION;  Surgeon: Leigh Elspeth SQUIBB, MD;  Location: [facility];  Service: Gastroenterology;  Laterality: N/A;   INGUINAL HERNIA REPAIR Bilateral 05/21/2014   Procedure: OPEN BILATERAL INGUINAL HERNIA REPAIRS WITH MESH;  Surgeon: Donnice Lima, MD;  Location: [facility];  Service: General;  Laterality: Bilateral;   POLYPECTOMY  03/25/2019   Procedure: POLYPECTOMY;  Surgeon: Leigh Elspeth SQUIBB, MD;  Location: [facility];  Service: Gastroenterology;;   POLYPECTOMY  08/26/2019   Procedure: POLYPECTOMY;  Surgeon: Leigh Elspeth SQUIBB, MD;  Location: [facility];  Service: Gastroenterology;;   POLYPECTOMY  03/19/2020   Procedure: POLYPECTOMY;  Surgeon: Leigh Elspeth SQUIBB, MD;  Location: [facility];  Service: Gastroenterology;;   WALDEMAR  08/26/2019   Procedure: Sprayed methylene blue ;  Surgeon: Leigh Elspeth SQUIBB, MD;  Location: [facility];  Service: Gastroenterology;;   SMALL INTESTINE SURGERY  1968, 2001    related to Chrohn's disease   TONSILLECTOMY      Family History: Family History  Problem Relation Age of Onset   Deep vein thrombosis Mother    Transient ischemic attack Mother    Heart disease Father    Cancer Maternal Aunt        unknown type; dx after 59   Cancer Cousin        maternal cousin; unknown type   Cancer Cousin        paternal cousin; unknown type; dx after 50   Stomach cancer Neg Hx    Colon cancer Neg Hx    Esophageal cancer Neg Hx    Pancreatic cancer Neg Hx    Rectal cancer Neg Hx    Colon polyps Neg Hx     Social History  reports that he quit smoking about 47 years ago. His smoking use included cigarettes. He has never used smokeless tobacco. He reports current alcohol use of about 2.0 standard drinks of alcohol per week. He reports that he does not use drugs.  No Known Allergies  Medications   Current Facility-Administered Medications:    sodium chloride  flush (NS) 0.9 % injection 3 mL, 3 mL, Intravenous, Once, Doretha Folks, MD  Current Outpatient Medications:    acetaminophen  (TYLENOL ) 500 MG tablet, Take 500-1,000 mg by mouth every 8 (eight) hours as needed for mild pain or headache. , Disp: , Rfl:    alendronate  (FOSAMAX ) 70 MG tablet, Take 1 tablet (70 mg total) by mouth every 7 (seven) days. Take with a full glass of water  on an empty stomach., Disp: 12 tablet, Rfl: 3   apixaban  (ELIQUIS ) 5 MG TABS tablet, Take 1 tablet (5 mg total) by mouth 2 (two) times daily., Disp: 60 tablet, Rfl: 5   Ascorbic Acid  (VITAMIN C ) 1000 MG tablet, Take 1,000 mg by mouth daily with breakfast. , Disp: , Rfl:    augmented betamethasone dipropionate (DIPROLENE-AF) 0.05 % cream, Apply 1 application topically 2 (two) times daily as needed (Grover's disease). , Disp: , Rfl:    B-D 3CC LUER-LOK SYR 25GX1 25G X 1 3 ML MISC, USE MONTHLY FOR B-12 INJECTIONS, Disp: 12 each, Rfl: 0    budesonide  (ENTOCORT EC ) 3 MG 24 hr capsule, AS NEEDED FOR FLARETake 3 capsules (9 mg total) by mouth daily, THEN 2 capsules (6 mg total) daily, THEN 1 capsule (3 mg total) daily. AS NEEDED FOR FLARE., Disp: , Rfl:    Cholecalciferol  (VITAMIN D3) 50 MCG (2000 UT) TABS, Take 2,000 Units by mouth daily with breakfast., Disp: , Rfl:  cyanocobalamin  (VITAMIN B12) 1000 MCG/ML injection, INJECT 1ML INTO THE MUSCLE ONCE MONTHLY, Disp: 10 mL, Rfl: 0   desmopressin  (DDAVP ) 0.1 MG tablet, Take 100 mcg by mouth daily., Disp: , Rfl:    dorzolamide -timolol  (COSOPT ) 22.3-6.8 MG/ML ophthalmic solution, Place 1 drop into the left eye 2 (two) times daily., Disp: , Rfl:    latanoprost  (XALATAN ) 0.005 % ophthalmic solution, Place 1 drop into the left eye at bedtime., Disp: , Rfl:    loperamide  (IMODIUM  A-D) 2 MG tablet, Take 2 mg by mouth daily as needed for diarrhea or loose stools., Disp: , Rfl:    metoprolol  succinate (TOPROL -XL) 25 MG 24 hr tablet, TAKE HALF TABLET BY MOUTH DAILY, Disp: 45 tablet, Rfl: 3   Netarsudil  Dimesylate 0.02 % SOLN, Place 1 drop into the left eye every evening., Disp: , Rfl:    omeprazole  (PRILOSEC  OTC) 20 MG tablet, Take 20 mg by mouth daily before breakfast., Disp: , Rfl:    tamsulosin  (FLOMAX ) 0.4 MG CAPS capsule, Take 0.4 mg by mouth at bedtime., Disp: , Rfl:   Vitals   There were no vitals filed for this visit.  There is no height or weight on file to calculate BMI.   Physical Exam   Constitutional: Appears well-developed and well-nourished.   Psych: Affect appropriate to situation.   Eyes: No scleral injection.   HENT: No OP obstruction.   Head: Normocephalic.   Cardiovascular: Normal rate and regular rhythm.   Respiratory: Effort normal, non-labored breathing.   GI: Soft.  No distension. There is no tenderness.   Skin: WDI.    Neurologic Examination   Mental Status -  Confused stated age as 82 stated correct month October After CT was able to answer orientation  questions correctly.  Mild aphasia could follow simple commands only   Cranial Nerves II - XII - II - Visual field intact OU . III, IV, VI - Extraocular movements intact . V - Facial sensation intact bilaterally . VII - Facial movement intact bilaterally . VIII - Hearing & vestibular intact bilaterally . X - Palate elevates symmetrically . XI - Chin turning & shoulder shrug intact bilaterally . XII - Tongue protrusion intact .  Motor Strength - The patient's strength was normal in all extremities and pronator drift was absent.  Bulk was normal and fasciculations were absent .   Motor Tone - Muscle tone was assessed at the neck and appendages and was normal . Sensory - Light touch, temperature/pinprick were assessed and were symmetrical.   Coordination - Unable to assess due to inability to understand directions  Gait and Station - deferred.  Labs/Imaging/Neurodiagnostic studies   CBC: No results for input(s): WBC, NEUTROABS, HGB, HCT, MCV, PLT in the last 168 hours. Basic Metabolic Panel:  Lab Results  Component Value Date   NA 137 10/02/2023   K 3.4 (L) 10/02/2023   CO2 30 10/02/2023   GLUCOSE 106 (H) 10/02/2023   BUN 13 10/02/2023   CREATININE 0.86 10/02/2023   CALCIUM  8.6 10/02/2023   GFRNONAA >60 09/22/2023   GFRAA >60 09/11/2019   Lipid Panel:  Lab Results  Component Value Date   LDLCALC 32 02/21/2023   HgbA1c:  Lab Results  Component Value Date   HGBA1C 5.6 01/14/2021   Urine Drug Screen:     Component Value Date/Time   LABOPIA NONE DETECTED 09/23/2020 1045   COCAINSCRNUR NONE DETECTED 09/23/2020 1045   LABBENZ NONE DETECTED 09/23/2020 1045   AMPHETMU NONE DETECTED 09/23/2020 1045  THCU NONE DETECTED 09/23/2020 1045   LABBARB NONE DETECTED 09/23/2020 1045    Alcohol Level     Component Value Date/Time   ETH <10 10/26/2021 1615   INR  Lab Results  Component Value Date   INR 1.8 (H) 09/20/2023   APTT  Lab Results  Component Value  Date   APTT 51 (H) 10/26/2021   AED levels: No results found for: PHENYTOIN, ZONISAMIDE, LAMOTRIGINE, LEVETIRACETA  CT Head without contrast(Personally reviewed): No acute process     ASSESSMENT   Michael [name] is a 89 y.o. male hx of A fib on Eliquis , crohns disease, glaucoma, TIA, memory impairments, B12 deficiency who was visiting his wife with his daughter at the bedside on 3W. LKW 1500. He was watching a volleyball game with his daughter when he acutely had a change. Per the daughter, He suddenly turned his head to the right and started picking  with his fingers and seeming to get agitated and was not speaking.  RECOMMENDATIONS  CTA head and neck ordered  LTM EEG to evaluate for seizures  Further recommendations to follow after above tests are completed  Neurology will continue to follow  ______________________________________________________________________    Signed, Karna DELENA Geralds, NP [facility]

## 2023-10-28 NOTE — ED Notes (Signed)
 Patient transported to MRI

## 2023-10-28 NOTE — ED Provider Notes (Signed)
 Patient here with strokelike symptoms.  Get an MRI to admit for LTM seizure versus stroke.  CT scan already with acute ischemic changes in the right frontal lobe.  Concerning for acute cortical infarct in the right ACA territory.  He has a remote infarct as well.  LTM is in process.  MRI to be completed.  Will admit for further care.  This chart was dictated using voice recognition software.  Despite best efforts to proofread,  errors can occur which can change the documentation meaning.    Ruthe Cornet, DO 10/28/23 2157

## 2023-10-28 NOTE — Progress Notes (Cosign Needed Addendum)
 LTM EEG hooked up and running - no initial skin breakdown - push button tested - [facility] monitoring. MRI compatible leads.

## 2023-10-29 ENCOUNTER — Encounter (HOSPITAL_COMMUNITY): Payer: Self-pay | Admitting: Family Medicine

## 2023-10-29 ENCOUNTER — Observation Stay (HOSPITAL_COMMUNITY)

## 2023-10-29 DIAGNOSIS — K50012 Crohn's disease of small intestine with intestinal obstruction: Secondary | ICD-10-CM | POA: Diagnosis not present

## 2023-10-29 DIAGNOSIS — E871 Hypo-osmolality and hyponatremia: Secondary | ICD-10-CM | POA: Diagnosis not present

## 2023-10-29 DIAGNOSIS — G3184 Mild cognitive impairment, so stated: Secondary | ICD-10-CM | POA: Diagnosis not present

## 2023-10-29 DIAGNOSIS — G9340 Encephalopathy, unspecified: Secondary | ICD-10-CM | POA: Diagnosis not present

## 2023-10-29 DIAGNOSIS — I480 Paroxysmal atrial fibrillation: Secondary | ICD-10-CM | POA: Diagnosis not present

## 2023-10-29 DIAGNOSIS — R569 Unspecified convulsions: Secondary | ICD-10-CM

## 2023-10-29 DIAGNOSIS — Z8673 Personal history of transient ischemic attack (TIA), and cerebral infarction without residual deficits: Secondary | ICD-10-CM | POA: Diagnosis not present

## 2023-10-29 LAB — URINALYSIS, ROUTINE W REFLEX MICROSCOPIC
Bilirubin Urine: NEGATIVE
Glucose, UA: NEGATIVE mg/dL
Hgb urine dipstick: NEGATIVE
Ketones, ur: NEGATIVE mg/dL
Leukocytes,Ua: NEGATIVE
Nitrite: NEGATIVE
Protein, ur: NEGATIVE mg/dL
Specific Gravity, Urine: 1.006 (ref 1.005–1.030)
pH: 7 (ref 5.0–8.0)

## 2023-10-29 LAB — BASIC METABOLIC PANEL WITH GFR
Anion gap: 9 (ref 5–15)
BUN: 12 mg/dL (ref 8–23)
CO2: 24 mmol/L (ref 22–32)
Calcium: 8.5 mg/dL — ABNORMAL LOW (ref 8.9–10.3)
Chloride: 99 mmol/L (ref 98–111)
Creatinine, Ser: 0.9 mg/dL (ref 0.61–1.24)
GFR, Estimated: >60 mL/min (ref >60)
Glucose, Bld: 97 mg/dL (ref 70–99)
Potassium: 3.8 mmol/L (ref 3.5–5.1)
Sodium: 132 mmol/L — ABNORMAL LOW (ref 135–145)

## 2023-10-29 LAB — VITAMIN B12: Vitamin B-12: 397 pg/mL (ref 180–914)

## 2023-10-29 LAB — AMMONIA: Ammonia: 31 umol/L (ref 9–35)

## 2023-10-29 LAB — CBC
HCT: 30.5 % — ABNORMAL LOW (ref 39.0–52.0)
Hemoglobin: 10.3 g/dL — ABNORMAL LOW (ref 13.0–17.0)
MCH: 29.5 pg (ref 26.0–34.0)
MCHC: 33.8 g/dL (ref 30.0–36.0)
MCV: 87.4 fL (ref 80.0–100.0)
Platelets: 133 K/uL — ABNORMAL LOW (ref 150–400)
RBC: 3.49 MIL/uL — ABNORMAL LOW (ref 4.22–5.81)
RDW: 19.9 % — ABNORMAL HIGH (ref 11.5–15.5)
WBC: 5.8 K/uL (ref 4.0–10.5)
nRBC: 0 % (ref 0.0–0.2)

## 2023-10-29 LAB — RAPID URINE DRUG SCREEN, HOSP PERFORMED
Amphetamines: NOT DETECTED
Barbiturates: NOT DETECTED
Benzodiazepines: NOT DETECTED
Cocaine: NOT DETECTED
Opiates: NOT DETECTED
Tetrahydrocannabinol: NOT DETECTED

## 2023-10-29 LAB — HEPATIC FUNCTION PANEL
ALT: 9 U/L (ref 0–44)
AST: 15 U/L (ref 15–41)
Albumin: 2.9 g/dL — ABNORMAL LOW (ref 3.5–5.0)
Alkaline Phosphatase: 50 U/L (ref 38–126)
Bilirubin, Direct: 0.2 mg/dL (ref 0.0–0.2)
Indirect Bilirubin: 1.2 mg/dL — ABNORMAL HIGH (ref 0.3–0.9)
Total Bilirubin: 1.4 mg/dL — ABNORMAL HIGH (ref 0.0–1.2)
Total Protein: 5.9 g/dL — ABNORMAL LOW (ref 6.5–8.1)

## 2023-10-29 LAB — TSH: TSH: 1.864 u[IU]/mL (ref 0.350–4.500)

## 2023-10-29 MED ORDER — NETARSUDIL DIMESYLATE 0.02 % OP SOLN: 1.0000 [drp] | Freq: Every evening | OPHTHALMIC | Status: DC

## 2023-10-29 MED ORDER — SODIUM CHLORIDE 0.9 % IV SOLN: INTRAVENOUS | Status: AC

## 2023-10-29 MED ORDER — SODIUM CHLORIDE 0.9% FLUSH
3.0000 mL | Freq: Two times a day (BID) | INTRAVENOUS | Status: DC
  Administered 2023-10-29 – 2023-10-30 (×4): 3 mL via INTRAVENOUS

## 2023-10-29 MED ORDER — TAMSULOSIN HCL 0.4 MG PO CAPS
0.4000 mg | ORAL_CAPSULE | Freq: Every day | ORAL | Status: DC
  Administered 2023-10-29: 0.4 mg via ORAL
  Filled 2023-10-29: qty 1

## 2023-10-29 MED ORDER — ONDANSETRON HCL 4 MG/2ML IJ SOLN: 4.0000 mg | Freq: Four times a day (QID) | INTRAMUSCULAR | Status: DC | PRN

## 2023-10-29 MED ORDER — SENNOSIDES-DOCUSATE SODIUM 8.6-50 MG PO TABS: 1.0000 | ORAL_TABLET | Freq: Every evening | ORAL | Status: DC | PRN

## 2023-10-29 MED ORDER — DORZOLAMIDE HCL-TIMOLOL MAL 2-0.5 % OP SOLN
1.0000 [drp] | Freq: Two times a day (BID) | OPHTHALMIC | Status: DC
  Administered 2023-10-29 – 2023-10-30 (×2): 1 [drp] via OPHTHALMIC
  Filled 2023-10-29: qty 10

## 2023-10-29 MED ORDER — ACETAMINOPHEN 650 MG RE SUPP: 650.0000 mg | Freq: Four times a day (QID) | RECTAL | Status: DC | PRN

## 2023-10-29 MED ORDER — SODIUM CHLORIDE 0.9 % IV BOLUS
500.0000 mL | Freq: Once | INTRAVENOUS | Status: AC
  Administered 2023-10-29: 500 mL via INTRAVENOUS

## 2023-10-29 MED ORDER — LEVETIRACETAM (KEPPRA) 500 MG/5 ML ADULT IV PUSH
2500.0000 mg | Freq: Once | INTRAVENOUS | Status: AC
Start: 2023-10-29 — End: 2023-10-29
  Administered 2023-10-29: 2500 mg via INTRAVENOUS
  Filled 2023-10-29: qty 25

## 2023-10-29 MED ORDER — LEVETIRACETAM 500 MG PO TABS
750.0000 mg | ORAL_TABLET | Freq: Two times a day (BID) | ORAL | Status: DC
  Administered 2023-10-30: 750 mg via ORAL
  Filled 2023-10-29: qty 1

## 2023-10-29 MED ORDER — DESMOPRESSIN ACETATE 0.1 MG PO TABS
100.0000 ug | ORAL_TABLET | Freq: Every day | ORAL | Status: DC
Start: 2023-10-29 — End: 2023-10-30
  Administered 2023-10-29: 100 ug via ORAL
  Filled 2023-10-29 (×2): qty 1

## 2023-10-29 MED ORDER — ACETAMINOPHEN 325 MG PO TABS: 650.0000 mg | ORAL_TABLET | Freq: Four times a day (QID) | ORAL | Status: DC | PRN

## 2023-10-29 MED ORDER — ONDANSETRON HCL 4 MG PO TABS: 4.0000 mg | ORAL_TABLET | Freq: Four times a day (QID) | ORAL | Status: DC | PRN

## 2023-10-29 MED ORDER — APIXABAN 5 MG PO TABS
5.0000 mg | ORAL_TABLET | Freq: Two times a day (BID) | ORAL | Status: DC
  Administered 2023-10-29 – 2023-10-30 (×3): 5 mg via ORAL
  Filled 2023-10-29 (×4): qty 1

## 2023-10-29 MED ORDER — LATANOPROST 0.005 % OP SOLN
1.0000 [drp] | Freq: Every day | OPHTHALMIC | Status: DC
  Administered 2023-10-29: 1 [drp] via OPHTHALMIC
  Filled 2023-10-29: qty 2.5

## 2023-10-29 MED ORDER — INFLUENZA VAC SPLIT HIGH-DOSE 0.5 ML IM SUSY
0.5000 mL | PREFILLED_SYRINGE | INTRAMUSCULAR | Status: AC
  Administered 2023-10-30: 0.5 mL via INTRAMUSCULAR
  Filled 2023-10-29: qty 0.5

## 2023-10-29 MED ORDER — METOPROLOL SUCCINATE ER 25 MG PO TB24
12.5000 mg | ORAL_TABLET | Freq: Every day | ORAL | Status: DC
  Administered 2023-10-29 – 2023-10-30 (×2): 12.5 mg via ORAL
  Filled 2023-10-29 (×2): qty 1

## 2023-10-29 MED ORDER — VITAMIN B-6 25 MG PO TABS
50.0000 mg | ORAL_TABLET | Freq: Every day | ORAL | Status: DC
  Administered 2023-10-29 – 2023-10-30 (×2): 50 mg via ORAL
  Filled 2023-10-29 (×2): qty 2

## 2023-10-29 NOTE — Progress Notes (Signed)
 Nurse to Nurse Report:No report given to the RN   Shift Phone: [phone]  Nurse Tech for Patient: None assigned   Admission Assessment:  Neuro- Patient is alert and oriented x 4 but presents with STM loss HEENT- L eye sclera is red (chronic glaucoma), corrective lenses, bilateral hearing aides, oral/ nasal mucosal are dry, saliva is insufficient  CV- telemetry box 14, NSR seen no adventitious sounds heard  Respiratory- no adventitious sounds heard, wet weak cough present  GI- active bowel sounds in all quadrants, round but non obese abd  GU- external condom cath present, drainage appropriate (yellow urine)  Reproduction- deferred  MSK- 1 assist with front wheel walker, unsteady with marching Skin- Skin is intact, bilateral lower legs are mottled Behavior- Patient is impulsive d/t inability to orient to floor and equipment. Room is near nurses station   Patient Denies: Twana, N/V, Lightheadedness/Dizziness, CP/SOB, N/T, Falls/LOC, Cough/Congestion, GI/GU s/s  Evening Assessment: Patient becomes more difficult to acclimate and orientate to equipment and room. Provider and daughter notified.   Daughter requesting order for patient's home eye drops, Dr. Kathrin notified. Daughter also requesting information regarding timeline to d/c. Dr. Gonfa and neuro team consulted, no stable answer regarding concerns.   This RN recommends: Continued monitoring of patient safety.   Nurse to Nurse Report: Included information regarding patient's eye drop regiment, impulsivity and safety concerns, communication with daughter and provider.   Given to Barbie, RN  In the event that [facility] is incomplete, please utilize this progress note for this RN shift assessment of patient. [Documented by exception]   Gwyneth Razor, RN

## 2023-10-29 NOTE — Progress Notes (Signed)
 LTM maint complete - no skin breakdown seen Fp1 Fp2  Fp1 reapplied. Not monitored by [facility] while in the ER.

## 2023-10-29 NOTE — Care Management (Signed)
 Transition of Care [facility]) - Inpatient Brief Assessment   Patient Details  Name: Michael [name] MRN: [id number] Date of Birth: [birth year]  [facility] CM/SW Contact:    Corean JAYSON Canary, RN Phone Number: 10/29/2023, 1:13 PM   Clinical Narrative:  Met with patient in room. Patient had his lunch, but no utensils. Obtained utensils ( plastic). For patient, he isstill a little confused. Called daughter Ms Rosana for collateral and for MOON notice.  [facility] He receives PT and OT from them as well as 15 hours per week of aide services. She wants to see if we can assist in referral to [facility] for more aide services. Let her know that this is up to her PCP.  Called call center to let them know the patient presented to ED and was admitted under OBV.  Transition of Care Asessment: Insurance and Status: Insurance coverage has been reviewed Patient has primary care physician: Yes Home environment has been reviewed: Lives with wife- has 15 hours of aide, and PT OT from [place] Prior level of function:: Independent Prior/Current Home Services: Current home services Social Drivers of Health Review: SDOH reviewed no interventions necessary Readmission risk has been reviewed: Yes Transition of care needs: transition of care needs identified, TOC will continue to follow

## 2023-10-29 NOTE — Progress Notes (Signed)
 LTM EEG disconnected - no skin breakdown at [facility].

## 2023-10-29 NOTE — Progress Notes (Signed)
 PT Cancellation Note  Patient Details Name: Michael [name] MRN: [id number] DOB: 09-28-1932   Cancelled Treatment:    Reason Eval/Treat Not Completed: (P) Patient at procedure or test/unavailable Pt is off floor for procedure. PT will follow back for Evaluation tomorrow.   [name] B. Fleeta Lapidus PT, DPT Acute Rehabilitation Services Please use secure chat or  Call Office [phone]    [name] [name] 10/29/2023, 2:35 PM

## 2023-10-29 NOTE — Care Management Obs Status (Signed)
 MEDICARE OBSERVATION STATUS NOTIFICATION   Patient Details  Name: Michael [name] MRN: [id number] Date of Birth: [birth year]   Medicare Observation Status Notification Given:  Yes    Corean JAYSON Canary, RN 10/29/2023, 1:11 PM

## 2023-10-29 NOTE — ED Notes (Signed)
 Pt becoming very agitated about the monitor tracking his vitals.

## 2023-10-29 NOTE — Evaluation (Signed)
 Occupational Therapy Evaluation Patient Details Name: Michael [name] MRN: [id number] DOB: [birth year] Today's Date: 10/29/2023   History of Present Illness   Pt is an 89 y.o. male presenting 10/18 with acute onset aphasia while visiting wife. MRI brain negative. EEG within normal limits. PMH: chron's disease, atrial fibrillation on Eliquis , and TIAs     Clinical Impressions PTA, pt lived alone and was independent for BADL; aide 3 hours 5x/week to assist with home making and meal prep. Upon eval, pt presents with decreased balance, memory, problem solving. Pt needing up to set-up for UB ADL and CGA for LB ADL. Pt to continue to benefit from acute OT services. Pt wife also acutely hospitalized and on hospice staying with daughter when returns home, thus, pt with limited support aside from aides. Daughter present for eval feels unsure about pt dc home and OT agrees secondary to need for increased support initially. Patient will benefit from continued inpatient follow up therapy, <3 hours/day       If plan is discharge home, recommend the following:   A little help with walking and/or transfers;A little help with bathing/dressing/bathroom;Assistance with cooking/housework;Assist for transportation;Help with stairs or ramp for entrance     Functional Status Assessment   Patient has had a recent decline in their functional status and demonstrates the ability to make significant improvements in function in a reasonable and predictable amount of time.     Equipment Recommendations   None recommended by OT     Recommendations for Other Services   PT consult     Precautions/Restrictions   Precautions Precautions: Fall Recall of Precautions/Restrictions: Intact     Mobility Bed Mobility                    Transfers Overall transfer level: Needs assistance Equipment used: Rolling walker (2 wheels) Transfers: Sit to/from Stand Sit to Stand: Contact guard assist            General transfer comment: CGA for safety      Balance Overall balance assessment: Needs assistance Sitting-balance support: No upper extremity supported, Feet supported Sitting balance-Leahy Scale: Good     Standing balance support: Bilateral upper extremity supported, No upper extremity supported, During functional activity Standing balance-Leahy Scale: Poor Standing balance comment: reliant on device                           ADL either performed or assessed with clinical judgement   ADL Overall ADL's : Needs assistance/impaired Eating/Feeding: Independent;Sitting   Grooming: Contact guard assist;Standing   Upper Body Bathing: Set up;Sitting   Lower Body Bathing: Contact guard assist;Sit to/from stand   Upper Body Dressing : Set up;Sitting   Lower Body Dressing: Contact guard assist;Sit to/from stand   Toilet Transfer: Contact guard assist;Ambulation;Rolling walker (2 wheels)           Functional mobility during ADLs: Contact guard assist;Rolling walker (2 wheels)       Vision Baseline Vision/History: 1 Wears glasses;3 Glaucoma Ability to See in Adequate Light: 1 Impaired Patient Visual Report: No change from baseline Additional Comments: no change from baseline     Perception Perception: Not tested       Praxis Praxis: Not tested       Pertinent Vitals/Pain Pain Assessment Pain Assessment: No/denies pain     Extremity/Trunk Assessment Upper Extremity Assessment Upper Extremity Assessment: Generalized weakness   Lower Extremity Assessment Lower Extremity Assessment: Defer to  PT evaluation       Communication Communication Communication: Impaired Factors Affecting Communication: Hearing impaired   Cognition Arousal: Alert Behavior During Therapy: WFL for tasks assessed/performed Cognition: Cognition impaired     Awareness: Intellectual awareness intact, Online awareness impaired Memory impairment (select all  impairments): Short-term memory Attention impairment (select first level of impairment): Selective attention Executive functioning impairment (select all impairments): Problem solving, Reasoning                   Following commands: Impaired Following commands impaired: Follows one step commands with increased time, Follows multi-step commands inconsistently     Cueing  General Comments   Cueing Techniques: Verbal cues      Exercises     Shoulder Instructions      Home Living Family/patient expects to be discharged to:: Private residence Living Arrangements: Alone Available Help at Discharge: Family;Available PRN/intermittently Type of Home: House (town home) Home Access: Level entry     Home Layout: One level     Bathroom Shower/Tub: Walk-in Human resources officer: Standard     Home Equipment: Agricultural consultant (2 wheels);Shower seat          Prior Functioning/Environment Prior Level of Function : Independent/Modified Independent             Mobility Comments: RW ADLs Comments: aide 3 hr 5 days/week assists mostly with home making and meals    OT Problem List: Decreased strength;Decreased activity tolerance;Impaired balance (sitting and/or standing);Decreased cognition   OT Treatment/Interventions: Self-care/ADL training;Therapeutic exercise;DME and/or AE instruction;Therapeutic activities;Patient/family education;Balance training      OT Goals(Current goals can be found in the care plan section)   Acute Rehab OT Goals Patient Stated Goal: get home OT Goal Formulation: With patient Time For Goal Achievement: 11/12/23 Potential to Achieve Goals: Good   OT Frequency:  Min 2X/week    Co-evaluation              AM-PAC OT 6 Clicks Daily Activity     Outcome Measure Help from another person eating meals?: None Help from another person taking care of personal grooming?: A Little Help from another person toileting, which includes using  toliet, bedpan, or urinal?: A Little Help from another person bathing (including washing, rinsing, drying)?: A Little Help from another person to put on and taking off regular upper body clothing?: A Little Help from another person to put on and taking off regular lower body clothing?: A Little 6 Click Score: 19   End of Session Equipment Utilized During Treatment: Gait belt;Rolling walker (2 wheels) Nurse Communication: Mobility status  Activity Tolerance: Patient tolerated treatment well Patient left: in chair;with call bell/phone within reach;with chair alarm set;with nursing/sitter in room;with family/visitor present  OT Visit Diagnosis: Unsteadiness on feet (R26.81);Muscle weakness (generalized) (M62.81);Other symptoms and signs involving cognitive function                Time: 8381-8362 OT Time Calculation (min): 19 min Charges:  OT General Charges $OT Visit: 1 Visit OT Evaluation $OT Eval Low Complexity: 1 Low  Michael [name], Michael [name] Acute Rehabilitation Office: [phone]   Michael [name] 10/29/2023, 5:06 PM

## 2023-10-29 NOTE — Progress Notes (Signed)
 PROGRESS NOTE  [name] FMW:[id number] DOB: [birth year]   PCP: [name], MD  Patient is from: Home.  DOA: 10/28/2023 LOS: 0  Chief complaints Altered mental status    Brief Narrative / Interim history: 89 year old M with PMH of chron's disease, A-fib on Eliquis , and TIAs who had acute change in mental status marked by aphasia, turning his head to the right, difficulty speaking, restlessness, agitation and picking at his hands while visiting his wife in the hospital.  Initially, code stroke activated but aborted by neurology.  CT head, CT angio head and neck and MRI brain without acute finding.  He was loaded with Keppra 3 g.  LTM EEG ordered and admitted for encephalopathy.  LTM EEG normal.  Patient is awake and alert and oriented x 4 but a little confused taking off his oxygen monitors and EEG wires.  Subjective: Seen and examined earlier this morning.  No major events overnight or this morning.  No complaints but wants to get out of here.  He is awake and alert and oriented x 4 but confused about situation.  He does not understand why he has EEG wires on his head.  He took off his oxygen pulse ox.  Assessment and plan: Acute encephalopathy: Encephalopathy seems to have resolved but he is confused about situation.  He is awake and alert and oriented x 4 but does not understand why he has EEG wires on his head and also taking off his oxygen pulse ox.  Has history of mild cognitive impairment.  CT head, CT angio and MRI brain without acute finding but old lacunar infarcts.  Neuroexam intact. -Following neurology recommendation -Basic encephalopathy labs suggest TSH, B12, UDS, ammonia, UA and RPR -PT/OT/SLP eval. -Follow further neuro recs.  Paroxysmal A-fib: Rate controlled. - Continue metoprolol  and Eliquis    -Optimize electrolytes  Normocytic anemia: Stable -Continue monitoring   Hyponatremia: Mild.  Improved.  Mild cognitive impairment  - Reorientation and  delirium precautions.   Crohn's disease: Quiescent   Hyperbilirubinemia: Mild.  Stable.  Thrombocytopenia: Mild and stable.  Body mass index is 24.27 kg/m.           DVT prophylaxis:   apixaban  (ELIQUIS ) tablet 5 mg  Code Status: Full code Family Communication: Updated patient's daughter over the phone Level of care: Telemetry Medical Status is: Observation The patient will require care spanning > 2 midnights and should be moved to inpatient because: Mental status change/confusion   Final disposition: Home   55 minutes with more than 50% spent in reviewing records, counseling patient/family and coordinating care.  Consultants:  Neurology  Procedures: None  Microbiology summarized: None  Objective: Vitals:   10/29/23 0244 10/29/23 0500 10/29/23 0615 10/29/23 1019  BP:  115/67 131/73 112/61  Pulse:  (!) 57 70 71  Resp:   16 16  Temp: (!) 97.5 F (36.4 C)  97.6 F (36.4 C) (!) 97.1 F (36.2 C)  TempSrc: Axillary  Axillary Oral  SpO2:  93% 94% 98%  Weight:      Height:        Examination:  GENERAL: No apparent distress.  Nontoxic. HEENT: MMM.  Vision and hearing grossly intact.  NECK: Supple.  No apparent JVD.  RESP:  No IWOB.  Fair aeration bilaterally. CVS:  RRR. Heart sounds normal.  ABD/GI/GU: BS+. Abd soft, NTND.  MSK/EXT:  Moves extremities. No apparent deformity. No edema.  SKIN: no apparent skin lesion or wound NEURO: Awake and alert.  Oriented x 4  but somewhat confused about situation. Speech clear. Cranial nerves II-XII intact. Motor intact and symmetric in all extremities.  Light sensation intact in all dermatomes. Patellar reflex symmetric.  No pronator drift.  Finger to nose intact.  No nystagmus. PSYCH: Somewhat upset   Sch Meds:  Scheduled Meds:  apixaban   5 mg Oral BID   desmopressin   100 mcg Oral QHS   metoprolol  succinate  12.5 mg Oral Daily   sodium chloride  flush  3 mL Intravenous Q12H   tamsulosin   0.4 mg Oral QHS    Continuous Infusions:  sodium chloride  75 mL/hr at 10/29/23 0419   PRN Meds:.acetaminophen  **OR** acetaminophen , ondansetron  **OR** ondansetron  (ZOFRAN ) IV, senna-docusate  Antimicrobials: Anti-infectives (From admission, onward)    None        I have personally reviewed the following labs and images: CBC: Recent Labs  Lab 10/28/23 1615 10/29/23 0420  WBC 7.0 5.8  NEUTROABS 4.1  --   HGB 10.2*  10.5* 10.3*  HCT 31.0*  31.0* 30.5*  MCV 88.1 87.4  PLT 131* 133*   BMP &GFR Recent Labs  Lab 10/28/23 1615 10/29/23 0420  NA 130*  131* 132*  K 4.0  4.1 3.8  CL 96*  95* 99  CO2 25 24  GLUCOSE 98  94 97  BUN 14  15 12   CREATININE 0.96  1.10 0.90  CALCIUM  8.7* 8.5*   Estimated Creatinine Clearance: 59.3 mL/min (by C-G formula based on SCr of 0.9 mg/dL). Liver & Pancreas: Recent Labs  Lab 10/28/23 1615 10/29/23 0420  AST 16 15  ALT 12 9  ALKPHOS 53 50  BILITOT 1.5* 1.4*  PROT 6.3* 5.9*  ALBUMIN 3.3* 2.9*   No results for input(s): LIPASE, AMYLASE in the last 168 hours. No results for input(s): AMMONIA in the last 168 hours. Diabetic: No results for input(s): HGBA1C in the last 72 hours. Recent Labs  Lab 10/28/23 1808  GLUCAP 75   Cardiac Enzymes: No results for input(s): CKTOTAL, CKMB, CKMBINDEX, TROPONINI in the last 168 hours. Recent Labs    10/02/23 1542  PROBNP 242.0*   Coagulation Profile: Recent Labs  Lab 10/28/23 1615  INR 1.3*   Thyroid  Function Tests: Recent Labs    10/29/23 0420  TSH 1.864   Lipid Profile: No results for input(s): CHOL, HDL, LDLCALC, TRIG, CHOLHDL, LDLDIRECT in the last 72 hours. Anemia Panel: No results for input(s): VITAMINB12, FOLATE, FERRITIN, TIBC, IRON, RETICCTPCT in the last 72 hours. Urine analysis:    Component Value Date/Time   COLORURINE YELLOW 09/20/2023 1522   APPEARANCEUR CLEAR 09/20/2023 1522   LABSPEC 1.019 09/20/2023 1522   PHURINE 5.0  09/20/2023 1522   GLUCOSEU NEGATIVE 09/20/2023 1522   GLUCOSEU NEGATIVE 05/08/2019 1623   HGBUR MODERATE (A) 09/20/2023 1522   BILIRUBINUR NEGATIVE 09/20/2023 1522   BILIRUBINUR negative 05/11/2022 1655   KETONESUR NEGATIVE 09/20/2023 1522   PROTEINUR 100 (A) 09/20/2023 1522   UROBILINOGEN 0.2 05/11/2022 1655   UROBILINOGEN 0.2 05/08/2019 1623   NITRITE NEGATIVE 09/20/2023 1522   LEUKOCYTESUR NEGATIVE 09/20/2023 1522   Sepsis Labs: Invalid input(s): PROCALCITONIN, LACTICIDVEN  Microbiology: No results found for this or any previous visit (from the past 240 hours).  Radiology Studies: Overnight EEG with video Result Date: 10/29/2023 Shelton Arlin KIDD, MD     10/29/2023  7:13 AM Patient Name: Michael [name] MRN: [id number] Epilepsy Attending: Arlin KIDD Shelton Referring Physician/Provider: Sallyann Normie HERO, MD Duration: 10/18/202 1745 to 10/29/2023 0700 Patient history:  89 y.o. male  hx of A fib on Eliquis , crohns disease, glaucoma, TIA, memory impairments, B12 deficiency who was visiting his wife with his daughter at the bedside on 3W. LKW 1500. He was watching a volleyball game with his daughter when he acutely had a change. Per the daughter, He suddenly turned his head to the right and started picking  with his fingers and seeming to get agitated and was not speaking. EEG to evaluate for seizure Level of alertness: Awake, asleep AEDs during EEG study: None Technical aspects: This EEG study was done with scalp electrodes positioned according to the 10-20 International system of electrode placement. Electrical activity was reviewed with band pass filter of 1-70Hz , sensitivity of 7 uV/mm, display speed of 56mm/sec with a 60Hz  notched filter applied as appropriate. EEG data were recorded continuously and digitally stored.  Video monitoring was available and reviewed as appropriate. Description: The posterior dominant rhythm consists of 8-9 Hz activity of moderate voltage (25-35 uV) seen  predominantly in posterior head regions, symmetric and reactive to eye opening and eye closing. Sleep was characterized by vertex waves, sleep spindles (12 to 14 Hz), maximal frontocentral region. Hyperventilation and photic stimulation were not performed.   EEG was unhooked between 10/28/2023 1814 to 1856 for MRI IMPRESSION: This study is within normal limits. No seizures or epileptiform discharges were seen throughout the recording. A normal interictal EEG does not exclude the diagnosis of epilepsy. Arlin MALVA Krebs   MR BRAIN WO CONTRAST Result Date: 10/28/2023 EXAM: MRI BRAIN WITHOUT CONTRAST 10/28/2023 09:49:22 PM TECHNIQUE: Multiplanar multisequence MRI of the head/brain was performed without the administration of intravenous contrast. COMPARISON: Prior CTs from earlier the same day. CLINICAL HISTORY: Neuro deficit, acute, stroke suspected. FINDINGS: BRAIN AND VENTRICLES: No acute infarct. No intracranial hemorrhage. No mass. No midline shift. No hydrocephalus. Generalized age-related cerebral atrophy. Patchy T2/FLAIR hyperintensity involving the periventricular and deep white matter of both cerebral hemispheres, consistent with chronic small vessel ischemic disease. Few small remote lacunar infarcts present in the hemispheric cerebral white matter, left basal ganglia, and left thalamus. The sella is unremarkable. Normal flow voids. ORBITS: Prior bilateral ocular lens replacement. SINUSES AND MASTOIDS: Changes of chronic right maxillary sinusitis noted. BONES AND SOFT TISSUES: Normal marrow signal. No acute soft tissue abnormality. IMPRESSION: 1. No acute intracranial abnormality. 2. Generalized age-related cerebral atrophy. 3. Chronic small vessel ischemic disease with a few small remote lacunar infarcts about the hemispheric cerebral white matter, left basal ganglia, and left thalamus. Electronically signed by: Morene Hoard MD 10/28/2023 10:32 PM EDT RP Workstation: [id number]   CT ANGIO HEAD  NECK W WO CM Result Date: 10/28/2023 EXAM: CTA Head and Neck with Intravenous Contrast. CT Head without Contrast. CLINICAL HISTORY: Transient ischemic attack (TIA). TECHNIQUE: Axial CTA images of the head and neck performed with intravenous contrast. MIP reconstructed images were created and reviewed. Axial computed tomography images of the head/brain performed without intravenous contrast. 75 mL iohexol  (OMNIPAQUE ) 350 MG/ML injection. Note: Per PQRS, the description of internal carotid artery percent stenosis, including 0 percent or normal exam, is based on [place] American Symptomatic Carotid Endarterectomy Trial (NASCET) criteria. Dose reduction technique was used including one or more of the following: automated exposure control, adjustment of mA and kV according to patient size, and/or iterative reconstruction. CONTRAST: With and without IV contrast; 75 mL iohexol  (OMNIPAQUE ) 350 MG/ML injection. COMPARISON: CT from earlier in the same day. FINDINGS: CT HEAD: BRAIN: No acute intraparenchymal hemorrhage. No mass lesion. No CT evidence for acute territorial infarct. No  midline shift or extra-axial collection. Age-related cerebral atrophy with chronic microvascular ischemic disease. Calcified atherosclerosis present about the skull base. EEG leads overlie the scalp. VENTRICLES: No hydrocephalus. ORBITS: The orbits are unremarkable. SINUSES AND MASTOIDS: Changes of chronic right maxillary sinusitis noted. The remaining paranasal sinuses and mastoid air cells are clear. CTA NECK: COMMON CAROTID ARTERIES: Mild atheromatous change about the carotid bulbs bilaterally without hemodynamically significant stenosis. No dissection or occlusion. INTERNAL CAROTID ARTERIES: Mild atheromatous change about the carotid siphons without hemodynamically significant stenosis. No dissection or occlusion. VERTEBRAL ARTERIES: No significant stenosis. No dissection or occlusion. CTA HEAD: ANTERIOR CEREBRAL ARTERIES: No significant  stenosis. No occlusion. No aneurysm. MIDDLE CEREBRAL ARTERIES: No significant stenosis. No occlusion. No aneurysm. POSTERIOR CEREBRAL ARTERIES: No significant stenosis. No occlusion. No aneurysm. BASILAR ARTERY: No significant stenosis. No occlusion. No aneurysm. OTHER: SOFT TISSUES: No acute finding. No masses or lymphadenopathy. BONES: Moderate multilevel cervical spondylosis. Osteoarthritic changes noted about the right TMJ. No acute osseous abnormality. LUNGS: Centrilobular emphysema. Superimposed patchy opacity at the peripheral aspects of both upper lobes. While this may in part reflects scarring, superimposed changes of mild pneumonitis slash small airway disease could be contributory as well. IMPRESSION: 1. Negative CTA of the head and neck. No large vessel occlusion or other emergent finding. 2. Mild for age atheromatous disease about the carotid bulbs and carotid siphons without hemodynamically significant or correctable stenosis. 3. Negative noncontrast head CT. Previously questioned ischemic changes involving the right ACA distribution not appreciated on this exam. No other new acute intracranial abnormality. 4. Aortic atherosclerosis. Emphysema. 5. Patchy peripheral upper lobe opacities, likely scarring. Possible mild o changes needed pneumonitis or small airway disease could be considered in the correct clinical setting. Electronically signed by: Morene Hoard MD 10/28/2023 07:50 PM EDT RP Workstation: [id number]   CT HEAD CODE STROKE WO CONTRAST Result Date: 10/28/2023 EXAM: CT HEAD WITHOUT CONTRAST 10/28/2023 03:48:46 PM TECHNIQUE: CT of the head was performed without the administration of intravenous contrast. Automated exposure control, iterative reconstruction, and/or weight based adjustment of the mA/kV was utilized to reduce the radiation dose to as low as reasonably achievable. COMPARISON: CT head 06/02/2022. CLINICAL HISTORY: Neuro deficit, acute, stroke suspected. FINDINGS: BRAIN  AND VENTRICLES: No acute hemorrhage. Loss of gray-white differentiation in right frontal lobe. Remote lacunar infarct in left thalamus. Periventricular white matter changes, likely sequela of chronic small vessel ischemic disease. Atherosclerotic calcifications in intracranial carotid and vertebral arteries. No hydrocephalus. No extra-axial collection. No mass effect or midline shift. ORBITS: Bilateral lens replacements noted. No acute abnormality. SINUSES: Air-fluid level in right maxillary sinus. Remote fracture of right lamina papyracea. SOFT TISSUES AND SKULL: No acute soft tissue abnormality. No skull fracture. [place] stroke program early CT (aspect) score: Ganglionic (caudate, ic, lentiform nucleus, insula, M1-m3): 7 Supraganglionic (m4-m6): 3 Total: 10 IMPRESSION: 1. Acute ischemic changes in the right frontal lobe with loss of gray-white differentiation. Findings concerning for acute cortical infarct in the right ACA territory. 2.  ASPECT score is 10. 3. Chronic small vessel ischemic disease. 4. Remote lacunar infarct in the left thalamus. 5. Findings messaged to Dr. Sallyann at 4:00PM on 10/28/23. Electronically signed by: Donnice Mania MD 10/28/2023 04:01 PM EDT RP Workstation: [id number]      Ujbz T. Jmichael Gille [facility]  If 7PM-7AM, please contact night-coverage [email or web address] 10/29/2023, 12:03 PM

## 2023-10-29 NOTE — Progress Notes (Signed)
 EEG leads Fp1, Fp2, and O2 repaired after patient pulled them off. Head wrapped. No skin breakdown seen.

## 2023-10-29 NOTE — Procedures (Signed)
 Patient Name: Michael [name]  MRN: [id number]  Epilepsy Attending: Arlin MALVA Krebs  Referring Physician/Provider: Sallyann Normie HERO, MD  Duration: 10/18/202 1745 to 10/29/2023 1126  Patient history:  89 y.o. male hx of A fib on Eliquis , crohns disease, glaucoma, TIA, memory impairments, B12 deficiency who was visiting his wife with his daughter at the bedside on 3W. LKW 1500. He was watching a volleyball game with his daughter when he acutely had a change. Per the daughter, He suddenly turned his head to the right and started picking  with his fingers and seeming to get agitated and was not speaking. EEG to evaluate for seizure  Level of alertness: Awake, asleep  AEDs during EEG study: None  Technical aspects: This EEG study was done with scalp electrodes positioned according to the 10-20 International system of electrode placement. Electrical activity was reviewed with band pass filter of 1-70Hz , sensitivity of 7 uV/mm, display speed of 65mm/sec with a 60Hz  notched filter applied as appropriate. EEG data were recorded continuously and digitally stored.  Video monitoring was available and reviewed as appropriate.  Description: The posterior dominant rhythm consists of 8-9 Hz activity of moderate voltage (25-35 uV) seen predominantly in posterior head regions, symmetric and reactive to eye opening and eye closing. Sleep was characterized by vertex waves, sleep spindles (12 to 14 Hz), maximal frontocentral region. Hyperventilation and photic stimulation were not performed.     EEG was unhooked between 10/28/2023 1814 to 1856 for MRI  IMPRESSION: This study is within normal limits. No seizures or epileptiform discharges were seen throughout the recording.  A normal interictal EEG does not exclude the diagnosis of epilepsy.   Kaeo Jacome O Yehudit Fulginiti

## 2023-10-29 NOTE — Progress Notes (Signed)
 NEUROLOGY CONSULT FOLLOW UP NOTE   Date of service: October 29, 2023 Patient Name: JAMAIL CULLERS MRN:  [id number] DOB:  01/06/1933  Interval Hx/subjective   EEG without seizure.  MRI brain without acute infarct.  CTA without intracranial stenosis to explain symptoms.  Back to baseline.  Vitals   Vitals:   10/29/23 1019 10/29/23 1403 10/29/23 1444 10/29/23 1934  BP: 112/61  131/73 112/61  Pulse: 71  68 74  Resp: 16  12 18   Temp: (!) 97.1 F (36.2 C) (!) 97.3 F (36.3 C) 97.8 F (36.6 C) 97.6 F (36.4 C)  TempSrc: Oral Oral Oral   SpO2: 98%  98% 98%  Weight:   78.9 kg   Height:   5' 11 (1.803 m)      Body mass index is 24.26 kg/m.  Physical Exam   Constitutional: Appears well-developed and well-nourished.   Psych: Affect appropriate to situation.   Eyes: No scleral injection.   HENT: No OP obstruction.   Head: Normocephalic.   Cardiovascular: Normal rate and regular rhythm.   Respiratory: Effort normal, non-labored breathing.   GI: Soft.  No distension. There is no tenderness.   Skin: WDI.    Neurologic Examination   Mental status: alert, oriented to person, place and time. Able to provide history. Speech: no dysarthria, word-finding difficulty, paraphasic errors. Cranial nerves: PERRL EOMI VF full Face sensation intact bilaterally. Face symmetric at rest and with activation. Hearing grossly intact. Palate elevation symmetric Tongue protrudes midline and has full range of motion. SCM's full strength bilaterally. Motor: Normal bulk and tone. No abnormal movements 5/5 throughout Sensory: Grossly intact to light touch throughout. Reflexes: DTR's deferred. Coordination: FTN intact.  Gait: deferred   Medications  Current Facility-Administered Medications:    acetaminophen  (TYLENOL ) tablet 650 mg, 650 mg, Oral, Q6H PRN **OR** acetaminophen  (TYLENOL ) suppository 650 mg, 650 mg, Rectal, Q6H PRN, Opyd, Evalene RAMAN, MD   apixaban  (ELIQUIS ) tablet 5  mg, 5 mg, Oral, BID, Opyd, Timothy S, MD, 5 mg at 10/29/23 1102   desmopressin  (DDAVP ) tablet 100 mcg, 100 mcg, Oral, QHS, Gonfa, Taye T, MD   dorzolamide -timolol  (COSOPT ) 2-0.5 % ophthalmic solution 1 drop, 1 drop, Left Eye, BID, Gonfa, Taye T, MD   [START ON 10/30/2023] Influenza vac split trivalent PF (FLUZONE HIGH-DOSE) injection 0.5 mL, 0.5 mL, Intramuscular, Tomorrow-1000, Gonfa, Taye T, MD   latanoprost  (XALATAN ) 0.005 % ophthalmic solution 1 drop, 1 drop, Left Eye, QHS, Gonfa, Taye T, MD   [START ON 10/30/2023] levETIRAcetam (KEPPRA) tablet 750 mg, 750 mg, Oral, BID, Sherill Wegener M, MD   metoprolol  succinate (TOPROL -XL) 24 hr tablet 12.5 mg, 12.5 mg, Oral, Daily, Opyd, Timothy S, MD, 12.5 mg at 10/29/23 1102   Netarsudil  Dimesylate 0.02 % SOLN 1 drop, 1 drop, Left Eye, QPM, Gonfa, Taye T, MD   ondansetron  (ZOFRAN ) tablet 4 mg, 4 mg, Oral, Q6H PRN **OR** ondansetron  (ZOFRAN ) injection 4 mg, 4 mg, Intravenous, Q6H PRN, Opyd, Timothy S, MD   pyridOXINE (VITAMIN B6) tablet 50 mg, 50 mg, Oral, Daily, Wanisha Shiroma M, MD, 50 mg at 10/29/23 1701   senna-docusate (Senokot-S) tablet 1 tablet, 1 tablet, Oral, QHS PRN, Opyd, Evalene RAMAN, MD   sodium chloride  flush (NS) 0.9 % injection 3 mL, 3 mL, Intravenous, Q12H, Opyd, Timothy S, MD, 3 mL at 10/29/23 1105   tamsulosin  (FLOMAX ) capsule 0.4 mg, 0.4 mg, Oral, QHS, Gonfa, Taye T, MD  Labs and Diagnostic Imaging   CBC:  Recent Labs  Lab  10/28/23 1615 10/29/23 0420  WBC 7.0 5.8  NEUTROABS 4.1  --   HGB 10.2*  10.5* 10.3*  HCT 31.0*  31.0* 30.5*  MCV 88.1 87.4  PLT 131* 133*    Basic Metabolic Panel:  Lab Results  Component Value Date   NA 132 (L) 10/29/2023   K 3.8 10/29/2023   CO2 24 10/29/2023   GLUCOSE 97 10/29/2023   BUN 12 10/29/2023   CREATININE 0.90 10/29/2023   CALCIUM  8.5 (L) 10/29/2023   GFRNONAA >60 10/29/2023   GFRAA >60 09/11/2019   Lipid Panel:  Lab Results  Component Value Date   LDLCALC 32 02/21/2023   HgbA1c:  Lab  Results  Component Value Date   HGBA1C 5.6 01/14/2021   Urine Drug Screen:     Component Value Date/Time   LABOPIA NONE DETECTED 10/29/2023 1655   COCAINSCRNUR NONE DETECTED 10/29/2023 1655   LABBENZ NONE DETECTED 10/29/2023 1655   AMPHETMU NONE DETECTED 10/29/2023 1655   THCU NONE DETECTED 10/29/2023 1655   LABBARB NONE DETECTED 10/29/2023 1655    Alcohol Level     Component Value Date/Time   ETH <15 10/28/2023 1615   INR  Lab Results  Component Value Date   INR 1.3 (H) 10/28/2023   APTT  Lab Results  Component Value Date   APTT 40 (H) 10/28/2023   AED levels: No results found for: PHENYTOIN, ZONISAMIDE, LAMOTRIGINE, LEVETIRACETA  CT Head without contrast(Personally reviewed): Hypodensity in R frontal lobe.  CT angio Head and Neck with contrast(Personally reviewed): No significant intracranial stenosis of the L MCA  MRI Brain(Personally reviewed): No acute abnormality.  rEEG:  No seizure or epileptiform discharges or focal slowing  Assessment   EERO DINI is a 89 y.o. male with A fib on Eliquis , crohns disease, glaucoma, TIA, memory impairments, B12 deficiency who presented with seizure-like activity: He suddenly turned his head to the right and started picking  with his fingers and seeming to get agitated and was not speaking. Prior presentations called TIA's of similar semiology. CTA without intracranial stenosis of L MCA to explain symptoms. [facility] radiology report noted acute infarct of R frontal lobe, not seen on subsequent MRI brain. EEG without seizure or epileptiform discharges, however only routine and patient did not have episode during it. History consistent with seizure. Therefore will proceed with Keppra loading after discussion with his daughter, JANEANE, [name].  Recommendations  - Keppra 30mg /kg load followed by 750mg  BID maintenance - Vitamin B6 @ 50mg  for irritability side effect (can increase to 100mg  if  needed) ______________________________________________________________________   Signed, Arleth Mccullar M Dyasia Firestine, MD [facility]

## 2023-10-29 NOTE — Progress Notes (Signed)
 LTM maint complete - no skin breakdown seen. Patient moved rooms in ER.  Study in up and running. P7 serviced.  Not monitored by [facility] while in the ER. Event button tested

## 2023-10-29 NOTE — ED Notes (Signed)
 Pt frustrated about outside noise. Pt wants to take off bp cuff along with pulse ox. Pt is very agitated

## 2023-10-29 NOTE — H&P (Signed)
 History and Physical    Michael [name] FMW:[id number] DOB: [birth year] DOA: 10/28/2023  PCP: [name], MD   Patient coming from: Home   Chief Complaint: Aphasia   HPI: Michael [name] is an 89 y.o. male with medical history significant for chron's disease, atrial fibrillation on Eliquis , and TIAs who presents with acute onset aphasia.  Patient was here in the hospital visiting his wife on an inpatient unit when he was noted to have an acute change in his mental status marked by turning his head to the right, difficulty speaking, restlessness or agitation, and picking at his hands.  Family notes that the patient has had multiple prior episodes of aphasia diagnosed as TIAs.  By time of admission, patient has improved some, states that he remembers having difficulty speaking earlier, denies any other symptoms at this time, but is not oriented to place or time.  ED Course: Upon arrival to the ED, patient is found to be afebrile and saturating well on room air with normal HR and elevated BP.  EKG demonstrates sinus rhythm with RBBB.  There were no acute findings on CTA head and neck.  MRI brain is negative for acute intracranial abnormality.  Labs were most notable for sodium 130, normal creatinine, normal WBC, hemoglobin 10.2, and platelets 131,000.  Patient was evaluated by neurology in the emergency department, he was given 3000 mg IV Keppra, and continuous EEG was ordered.   Review of Systems:  ROS limited by patient's clinical condition.  Past Medical History:  Diagnosis Date   Adrenal insufficiency    Anemia    Atrial fibrillation (HCC)    Avascular necrosis (HCC)    Clavicle fracture 11/02/2017   Colon polyp    Colon polyps    Crohn's disease (HCC)    Glaucoma    Inguinal hernia recurrent bilateral    Low testosterone     Osteopenia    Retinal ischemia     Past Surgical History:  Procedure Laterality Date   ABDOMINAL SURGERY     BIOPSY  03/19/2020   Procedure:  BIOPSY;  Surgeon: Leigh Elspeth SQUIBB, MD;  Location: [facility];  Service: Gastroenterology;;   COLONOSCOPY N/A 12/13/2015   Procedure: COLONOSCOPY;  Surgeon: Lynwood Bohr, MD;  Location: [facility];  Service: Endoscopy;  Laterality: N/A;   COLONOSCOPY WITH PROPOFOL  N/A 03/25/2019   Procedure: COLONOSCOPY WITH PROPOFOL ;  Surgeon: Leigh Elspeth SQUIBB, MD;  Location: [facility];  Service: Gastroenterology;  Laterality: N/A;   COLONOSCOPY WITH PROPOFOL  N/A 08/26/2019   Procedure: COLONOSCOPY WITH PROPOFOL ;  Surgeon: Leigh Elspeth SQUIBB, MD;  Location: [facility];  Service: Gastroenterology;  Laterality: N/A;   COLONOSCOPY WITH PROPOFOL  N/A 03/19/2020   Procedure: COLONOSCOPY WITH PROPOFOL ;  Surgeon: Leigh Elspeth SQUIBB, MD;  Location: [facility];  Service: Gastroenterology;  Laterality: N/A;   ENDOSCOPIC MUCOSAL RESECTION N/A 08/26/2019   Procedure: ENDOSCOPIC MUCOSAL RESECTION;  Surgeon: Leigh Elspeth SQUIBB, MD;  Location: [facility];  Service: Gastroenterology;  Laterality: N/A;   INGUINAL HERNIA REPAIR Bilateral 05/21/2014   Procedure: OPEN BILATERAL INGUINAL HERNIA REPAIRS WITH MESH;  Surgeon: Donnice Lima, MD;  Location: [facility];  Service: General;  Laterality: Bilateral;   POLYPECTOMY  03/25/2019   Procedure: POLYPECTOMY;  Surgeon: Leigh Elspeth SQUIBB, MD;  Location: [facility];  Service: Gastroenterology;;   POLYPECTOMY  08/26/2019   Procedure: POLYPECTOMY;  Surgeon: Leigh Elspeth SQUIBB, MD;  Location: [facility];  Service: Gastroenterology;;   POLYPECTOMY  03/19/2020   Procedure: POLYPECTOMY;  Surgeon:  Leigh Elspeth SQUIBB, MD;  Location: [facility];  Service: Gastroenterology;;   WALDEMAR  08/26/2019   Procedure: Sprayed methylene blue ;  Surgeon: Leigh Elspeth SQUIBB, MD;  Location: [facility];  Service: Gastroenterology;;   SMALL INTESTINE SURGERY  1968, 2001    related to Chrohn's disease   TONSILLECTOMY      Social History:   reports  that he quit smoking about 47 years ago. His smoking use included cigarettes. He has never used smokeless tobacco. He reports current alcohol use of about 2.0 standard drinks of alcohol per week. He reports that he does not use drugs.  No Known Allergies  Family History  Problem Relation Age of Onset   Deep vein thrombosis Mother    Transient ischemic attack Mother    Heart disease Father    Cancer Maternal Aunt        unknown type; dx after 37   Cancer Cousin        maternal cousin; unknown type   Cancer Cousin        paternal cousin; unknown type; dx after 10   Stomach cancer Neg Hx    Colon cancer Neg Hx    Esophageal cancer Neg Hx    Pancreatic cancer Neg Hx    Rectal cancer Neg Hx    Colon polyps Neg Hx      Prior to Admission medications   Medication Sig Start Date End Date Taking? Authorizing Provider  acetaminophen  (TYLENOL ) 500 MG tablet Take 500-1,000 mg by mouth every 8 (eight) hours as needed for mild pain or headache.     [provider]  alendronate  (FOSAMAX ) 70 MG tablet Take 1 tablet (70 mg total) by mouth every 7 (seven) days. Take with a full glass of water  on an empty stomach. 03/20/23   [name], MD  apixaban  (ELIQUIS ) 5 MG TABS tablet Take 1 tablet (5 mg total) by mouth 2 (two) times daily. 09/06/21   Verlin Lonni BIRCH, MD  Ascorbic Acid  (VITAMIN C ) 1000 MG tablet Take 1,000 mg by mouth daily with breakfast.     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  10/03/17   [provider]  B-D 3CC LUER-LOK SYR 25GX1 25G X 1 3 ML MISC USE MONTHLY FOR B-12 INJECTIONS 01/13/23   Burns, Glade PARAS, MD  budesonide  (ENTOCORT EC ) 3 MG 24 hr capsule AS NEEDED FOR FLARETake 3 capsules (9 mg total) by mouth daily, THEN 2 capsules (6 mg total) daily, THEN 1 capsule (3 mg total) daily. AS NEEDED FOR FLARE. 11/28/22   Burns, Glade PARAS,  MD  Cholecalciferol  (VITAMIN D3) 50 MCG (2000 UT) TABS Take 2,000 Units by mouth daily with breakfast.    [provider]  cyanocobalamin  (VITAMIN B12) 1000 MCG/ML injection INJECT 1ML INTO THE MUSCLE ONCE MONTHLY 09/27/23   [name], MD  desmopressin  (DDAVP ) 0.1 MG tablet Take 100 mcg by mouth daily. 04/30/22   [provider]  dorzolamide -timolol  (COSOPT ) 22.3-6.8 MG/ML ophthalmic solution Place 1 drop into the left eye 2 (two) times daily. 11/05/15   [provider]  latanoprost  (XALATAN ) 0.005 % ophthalmic solution Place 1 drop into the left eye at bedtime. 09/12/17   [provider]  loperamide  (IMODIUM  A-D) 2 MG tablet Take 2 mg by mouth daily as needed for diarrhea or loose stools.    [provider]  metoprolol  succinate (TOPROL -XL) 25  MG 24 hr tablet TAKE HALF TABLET BY MOUTH DAILY 02/16/23   Verlin Lonni BIRCH, MD  Netarsudil  Dimesylate 0.02 % SOLN Place 1 drop into the left eye every evening.    [provider]  omeprazole  (PRILOSEC  OTC) 20 MG tablet Take 20 mg by mouth daily before breakfast.    [provider]  tamsulosin  (FLOMAX ) 0.4 MG CAPS capsule Take 0.4 mg by mouth at bedtime. 12/20/19   [provider]    Physical Exam: Vitals:   10/28/23 1800 10/28/23 1919 10/28/23 2243 10/28/23 2245  BP: (!) 171/91   (!) 156/89  Pulse: 63   65  Resp: 17   15  Temp:   97.9 F (36.6 C)   TempSrc:   Oral   SpO2: 99%   94%  Weight:  78.9 kg    Height:  5' 11 (1.803 m)       Constitutional: NAD, no pallor or diaphoresis   Eyes: PERTLA, lids and conjunctivae normal ENMT: Mucous membranes are moist. Posterior pharynx clear of any exudate or lesions.   Neck: supple, no masses  Respiratory: no wheezing, no crackles. No accessory muscle use.  Cardiovascular: S1 & S2 heard, regular rate and rhythm. No JVD. Abdomen: No tenderness, soft. Bowel sounds active.  Musculoskeletal: no clubbing / cyanosis. No joint  deformity upper and lower extremities.   Skin: no significant rashes, lesions, ulcers. Warm, dry, well-perfused. Neurologic: Mild aphasia, no gross facial asymmetry, PERRL, EOMI. Sensation to light touch intact. Strength 5/5 in all 4 limbs. Wakes to voice, remains somnolent, and is oriented to person only.  Psychiatric: Calm. Cooperative.    Labs and Imaging on Admission: I have personally reviewed following labs and imaging studies  CBC: Recent Labs  Lab 10/28/23 1615  WBC 7.0  NEUTROABS 4.1  HGB 10.2*  10.5*  HCT 31.0*  31.0*  MCV 88.1  PLT 131*   Basic Metabolic Panel: Recent Labs  Lab 10/28/23 1615  NA 130*  131*  K 4.0  4.1  CL 96*  95*  CO2 25  GLUCOSE 98  94  BUN 14  15  CREATININE 0.96  1.10  CALCIUM  8.7*   GFR: Estimated Creatinine Clearance: 55.6 mL/min (by C-G formula based on SCr of 0.96 mg/dL). Liver Function Tests: Recent Labs  Lab 10/28/23 1615  AST 16  ALT 12  ALKPHOS 53  BILITOT 1.5*  PROT 6.3*  ALBUMIN 3.3*   No results for input(s): LIPASE, AMYLASE in the last 168 hours. No results for input(s): AMMONIA in the last 168 hours. Coagulation Profile: Recent Labs  Lab 10/28/23 1615  INR 1.3*   Cardiac Enzymes: No results for input(s): CKTOTAL, CKMB, CKMBINDEX, TROPONINI in the last 168 hours. BNP (last 3 results) Recent Labs    10/02/23 1542  PROBNP 242.0*   HbA1C: No results for input(s): HGBA1C in the last 72 hours. CBG: Recent Labs  Lab 10/28/23 1808  GLUCAP 75   Lipid Profile: No results for input(s): CHOL, HDL, LDLCALC, TRIG, CHOLHDL, LDLDIRECT in the last 72 hours. Thyroid  Function Tests: No results for input(s): TSH, T4TOTAL, FREET4, T3FREE, THYROIDAB in the last 72 hours. Anemia Panel: No results for input(s): VITAMINB12, FOLATE, FERRITIN, TIBC, IRON, RETICCTPCT in the last 72 hours. Urine analysis:    Component Value Date/Time   COLORURINE YELLOW 09/20/2023  1522   APPEARANCEUR CLEAR 09/20/2023 1522   LABSPEC 1.019 09/20/2023 1522   PHURINE 5.0 09/20/2023 1522   GLUCOSEU NEGATIVE 09/20/2023 1522   GLUCOSEU NEGATIVE  05/08/2019 1623   HGBUR MODERATE (A) 09/20/2023 1522   BILIRUBINUR NEGATIVE 09/20/2023 1522   BILIRUBINUR negative 05/11/2022 1655   KETONESUR NEGATIVE 09/20/2023 1522   PROTEINUR 100 (A) 09/20/2023 1522   UROBILINOGEN 0.2 05/11/2022 1655   UROBILINOGEN 0.2 05/08/2019 1623   NITRITE NEGATIVE 09/20/2023 1522   LEUKOCYTESUR NEGATIVE 09/20/2023 1522   Sepsis Labs: @LABRCNTIP (procalcitonin:4,lacticidven:4) )No results found for this or any previous visit (from the past 240 hours).   Radiological Exams on Admission: MR BRAIN WO CONTRAST Result Date: 10/28/2023 EXAM: MRI BRAIN WITHOUT CONTRAST 10/28/2023 09:49:22 PM TECHNIQUE: Multiplanar multisequence MRI of the head/brain was performed without the administration of intravenous contrast. COMPARISON: Prior CTs from earlier the same day. CLINICAL HISTORY: Neuro deficit, acute, stroke suspected. FINDINGS: BRAIN AND VENTRICLES: No acute infarct. No intracranial hemorrhage. No mass. No midline shift. No hydrocephalus. Generalized age-related cerebral atrophy. Patchy T2/FLAIR hyperintensity involving the periventricular and deep white matter of both cerebral hemispheres, consistent with chronic small vessel ischemic disease. Few small remote lacunar infarcts present in the hemispheric cerebral white matter, left basal ganglia, and left thalamus. The sella is unremarkable. Normal flow voids. ORBITS: Prior bilateral ocular lens replacement. SINUSES AND MASTOIDS: Changes of chronic right maxillary sinusitis noted. BONES AND SOFT TISSUES: Normal marrow signal. No acute soft tissue abnormality. IMPRESSION: 1. No acute intracranial abnormality. 2. Generalized age-related cerebral atrophy. 3. Chronic small vessel ischemic disease with a few small remote lacunar infarcts about the hemispheric cerebral  white matter, left basal ganglia, and left thalamus. Electronically signed by: Morene Hoard MD 10/28/2023 10:32 PM EDT RP Workstation: [id number]   CT ANGIO HEAD NECK W WO CM Result Date: 10/28/2023 EXAM: CTA Head and Neck with Intravenous Contrast. CT Head without Contrast. CLINICAL HISTORY: Transient ischemic attack (TIA). TECHNIQUE: Axial CTA images of the head and neck performed with intravenous contrast. MIP reconstructed images were created and reviewed. Axial computed tomography images of the head/brain performed without intravenous contrast. 75 mL iohexol  (OMNIPAQUE ) 350 MG/ML injection. Note: Per PQRS, the description of internal carotid artery percent stenosis, including 0 percent or normal exam, is based on [place] American Symptomatic Carotid Endarterectomy Trial (NASCET) criteria. Dose reduction technique was used including one or more of the following: automated exposure control, adjustment of mA and kV according to patient size, and/or iterative reconstruction. CONTRAST: With and without IV contrast; 75 mL iohexol  (OMNIPAQUE ) 350 MG/ML injection. COMPARISON: CT from earlier in the same day. FINDINGS: CT HEAD: BRAIN: No acute intraparenchymal hemorrhage. No mass lesion. No CT evidence for acute territorial infarct. No midline shift or extra-axial collection. Age-related cerebral atrophy with chronic microvascular ischemic disease. Calcified atherosclerosis present about the skull base. EEG leads overlie the scalp. VENTRICLES: No hydrocephalus. ORBITS: The orbits are unremarkable. SINUSES AND MASTOIDS: Changes of chronic right maxillary sinusitis noted. The remaining paranasal sinuses and mastoid air cells are clear. CTA NECK: COMMON CAROTID ARTERIES: Mild atheromatous change about the carotid bulbs bilaterally without hemodynamically significant stenosis. No dissection or occlusion. INTERNAL CAROTID ARTERIES: Mild atheromatous change about the carotid siphons without hemodynamically  significant stenosis. No dissection or occlusion. VERTEBRAL ARTERIES: No significant stenosis. No dissection or occlusion. CTA HEAD: ANTERIOR CEREBRAL ARTERIES: No significant stenosis. No occlusion. No aneurysm. MIDDLE CEREBRAL ARTERIES: No significant stenosis. No occlusion. No aneurysm. POSTERIOR CEREBRAL ARTERIES: No significant stenosis. No occlusion. No aneurysm. BASILAR ARTERY: No significant stenosis. No occlusion. No aneurysm. OTHER: SOFT TISSUES: No acute finding. No masses or lymphadenopathy. BONES: Moderate multilevel cervical spondylosis. Osteoarthritic changes noted about the right  TMJ. No acute osseous abnormality. LUNGS: Centrilobular emphysema. Superimposed patchy opacity at the peripheral aspects of both upper lobes. While this may in part reflects scarring, superimposed changes of mild pneumonitis slash small airway disease could be contributory as well. IMPRESSION: 1. Negative CTA of the head and neck. No large vessel occlusion or other emergent finding. 2. Mild for age atheromatous disease about the carotid bulbs and carotid siphons without hemodynamically significant or correctable stenosis. 3. Negative noncontrast head CT. Previously questioned ischemic changes involving the right ACA distribution not appreciated on this exam. No other new acute intracranial abnormality. 4. Aortic atherosclerosis. Emphysema. 5. Patchy peripheral upper lobe opacities, likely scarring. Possible mild o changes needed pneumonitis or small airway disease could be considered in the correct clinical setting. Electronically signed by: Morene Hoard MD 10/28/2023 07:50 PM EDT RP Workstation: [id number]   CT HEAD CODE STROKE WO CONTRAST Result Date: 10/28/2023 EXAM: CT HEAD WITHOUT CONTRAST 10/28/2023 03:48:46 PM TECHNIQUE: CT of the head was performed without the administration of intravenous contrast. Automated exposure control, iterative reconstruction, and/or weight based adjustment of the mA/kV was  utilized to reduce the radiation dose to as low as reasonably achievable. COMPARISON: CT head 06/02/2022. CLINICAL HISTORY: Neuro deficit, acute, stroke suspected. FINDINGS: BRAIN AND VENTRICLES: No acute hemorrhage. Loss of gray-white differentiation in right frontal lobe. Remote lacunar infarct in left thalamus. Periventricular white matter changes, likely sequela of chronic small vessel ischemic disease. Atherosclerotic calcifications in intracranial carotid and vertebral arteries. No hydrocephalus. No extra-axial collection. No mass effect or midline shift. ORBITS: Bilateral lens replacements noted. No acute abnormality. SINUSES: Air-fluid level in right maxillary sinus. Remote fracture of right lamina papyracea. SOFT TISSUES AND SKULL: No acute soft tissue abnormality. No skull fracture. [place] stroke program early CT (aspect) score: Ganglionic (caudate, ic, lentiform nucleus, insula, M1-m3): 7 Supraganglionic (m4-m6): 3 Total: 10 IMPRESSION: 1. Acute ischemic changes in the right frontal lobe with loss of gray-white differentiation. Findings concerning for acute cortical infarct in the right ACA territory. 2.  ASPECT score is 10. 3. Chronic small vessel ischemic disease. 4. Remote lacunar infarct in the left thalamus. 5. Findings messaged to Dr. Sallyann at 4:00PM on 10/28/23. Electronically signed by: Donnice Mania MD 10/28/2023 04:01 PM EDT RP Workstation: [id number]    EKG: Independently reviewed. Sinus rhythm, RBBB.   Assessment/Plan   1. Acute encephalopathy  - Aphasia improving, somnolent and remains disoriented at time of admission  - No acute findings on CTA head & neck or MRI brain  - Follow-up on EEG results and neurology recommendations    2. Atrial fibrillation  - Continue metoprolol  and Eliquis     3. Hyponatremia  - Serum sodium 130  - Appears hypovolemic, will hydrate with NS and repeat serum chemistries    4. Mild cognitive impairment  - Delirium precautions    5. Crohn's  disease  - Quiescent    DVT prophylaxis: Eliquis   Code Status: Full  Level of Care: Level of care: Telemetry Medical Family Communication: Daughter updated from ED  Disposition Plan:  Patient is from: Home  Anticipated d/c is to: TBD Anticipated d/c date is: 10/19 or 10/30/23  Patient currently: Pending EEG results and further neurology recommendations  Consults called: Neurology  Admission status: Observation     Evalene GORMAN Sprinkles, MD [facility]  10/29/2023, 1:39 AM

## 2023-10-30 DIAGNOSIS — R569 Unspecified convulsions: Secondary | ICD-10-CM | POA: Diagnosis not present

## 2023-10-30 DIAGNOSIS — Z8673 Personal history of transient ischemic attack (TIA), and cerebral infarction without residual deficits: Secondary | ICD-10-CM | POA: Diagnosis not present

## 2023-10-30 DIAGNOSIS — E871 Hypo-osmolality and hyponatremia: Secondary | ICD-10-CM | POA: Diagnosis not present

## 2023-10-30 DIAGNOSIS — I480 Paroxysmal atrial fibrillation: Secondary | ICD-10-CM | POA: Diagnosis not present

## 2023-10-30 DIAGNOSIS — K50012 Crohn's disease of small intestine with intestinal obstruction: Secondary | ICD-10-CM | POA: Diagnosis not present

## 2023-10-30 DIAGNOSIS — G3184 Mild cognitive impairment, so stated: Secondary | ICD-10-CM | POA: Diagnosis not present

## 2023-10-30 DIAGNOSIS — G9340 Encephalopathy, unspecified: Secondary | ICD-10-CM | POA: Diagnosis not present

## 2023-10-30 LAB — CBC
HCT: 34.9 % — ABNORMAL LOW (ref 39.0–52.0)
Hemoglobin: 11.4 g/dL — ABNORMAL LOW (ref 13.0–17.0)
MCH: 28.9 pg (ref 26.0–34.0)
MCHC: 32.7 g/dL (ref 30.0–36.0)
MCV: 88.4 fL (ref 80.0–100.0)
Platelets: 133 K/uL — ABNORMAL LOW (ref 150–400)
RBC: 3.95 MIL/uL — ABNORMAL LOW (ref 4.22–5.81)
RDW: 20 % — ABNORMAL HIGH (ref 11.5–15.5)
WBC: 6.3 K/uL (ref 4.0–10.5)
nRBC: 0 % (ref 0.0–0.2)

## 2023-10-30 LAB — GLUCOSE, CAPILLARY: Glucose-Capillary: 90 mg/dL (ref 70–99)

## 2023-10-30 LAB — BASIC METABOLIC PANEL WITH GFR
Anion gap: 11 (ref 5–15)
BUN: 11 mg/dL (ref 8–23)
CO2: 24 mmol/L (ref 22–32)
Calcium: 8.9 mg/dL (ref 8.9–10.3)
Chloride: 100 mmol/L (ref 98–111)
Creatinine, Ser: 1.01 mg/dL (ref 0.61–1.24)
GFR, Estimated: >60 mL/min (ref >60)
Glucose, Bld: 114 mg/dL — ABNORMAL HIGH (ref 70–99)
Potassium: 3.3 mmol/L — ABNORMAL LOW (ref 3.5–5.1)
Sodium: 135 mmol/L (ref 135–145)

## 2023-10-30 LAB — RPR: RPR Ser Ql: NONREACTIVE

## 2023-10-30 LAB — MAGNESIUM: Magnesium: 1.8 mg/dL (ref 1.7–2.4)

## 2023-10-30 MED ORDER — POTASSIUM CHLORIDE CRYS ER 20 MEQ PO TBCR
60.0000 meq | EXTENDED_RELEASE_TABLET | Freq: Once | ORAL | Status: AC
Start: 2023-10-30 — End: 2023-10-30
  Administered 2023-10-30: 60 meq via ORAL
  Filled 2023-10-30: qty 3

## 2023-10-30 MED ORDER — LEVETIRACETAM 750 MG PO TABS: 750.0000 mg | ORAL_TABLET | Freq: Two times a day (BID) | ORAL | 0 refills | Status: DC

## 2023-10-30 MED ORDER — PYRIDOXINE HCL 50 MG PO TABS: 50.0000 mg | ORAL_TABLET | Freq: Every day | ORAL | 0 refills | Status: DC

## 2023-10-30 NOTE — Progress Notes (Signed)
 SLP Cancellation Note  Patient Details Name: Michael [name] MRN: [id number] DOB: 03-16-1932   Cancelled treatment:       Reason Eval/Treat Not Completed: SLP screened. MRI negative and pt passed the swallow screen. Pt and his daughter deny acute concerns with cognitive-linguistic or swallowing function. No needs identified, will sign off.   Damien Blumenthal, M.A., CCC-SLP Speech Language Pathology, Acute Rehabilitation Services  Secure Chat preferred [phone]  10/30/2023, 9:54 AM

## 2023-10-30 NOTE — Discharge Summary (Signed)
 Physician Discharge Summary  [name]:[id number] DOB: 05/02/1932 DOA: 10/28/2023  PCP: [name], MD  Admit date: 10/28/2023 Discharge date: 10/30/23  Admitted From: Home Disposition: Home Recommendations for Outpatient Follow-up:  Outpatient follow-up with neurology in 4 to 6 weeks Follow-up with PCP in 1 to 2 weeks Check CMP and CBC at follow-up Please follow up on the following pending results: None  Home Health: PT/OT Equipment/Devices: Patient has rolling walker  Discharge Condition: Stable CODE STATUS: Full code Diet Orders (From admission, onward)     Start     Ordered   10/28/23 1730  Diet regular Room service appropriate? Yes; Fluid consistency: Thin  Diet effective now       Question Answer Comment  Room service appropriate? Yes   Fluid consistency: Thin      10/28/23 1729             Follow-up Information     [name], MD. Schedule an appointment as soon as possible for a visit in 1 week(s).   Specialty: Internal Medicine Contact information: [id number] [place] [address] [phone]                 Hospital course 89 year old M with PMH of chron's disease, A-fib on Eliquis , and TIAs who had acute change in mental status marked by aphasia, turning his head to the right, difficulty speaking, restlessness, agitation and picking at his hands while visiting his wife in the hospital.  Initially, code stroke activated but aborted by neurology.  CT head, CT angio head and neck and MRI brain without acute finding.  He was loaded with Keppra 3 g.  LTM EEG ordered and admitted for encephalopathy.   LTM EEG normal but he has recurrent episodes concerning for seizure neurology recommended continuing Keppra after discussion with patient's daughter.  Patient is cleared for discharge on Keppra 750 mg twice daily.  Neurology also recommended vitamin B6 50 mg daily for irritability side effect.  Seizure precaution discussed.   See instructions below  On the day of discharge, awake and alert and oriented to self, place and person but not time.  No focal neurodeficit.  Therapy recommended home health.  He has rolling walker.  See individual problem list below for more.   Problems addressed during this hospitalization Acute encephalopathy/seizure-like activity: Encephalopathy seems to have resolved.  Oriented x 4 except time.  Encephalopathy workup including TSH, B12, ammonia, RPR, UDS and UA unrevealing.  CT head, CT angio and MRI brain without acute finding but old lacunar infarcts.  Neuroexam intact.  LTM EEG negative for seizure or epileptiform discharge.  Suspicion for seizure remains high despite negative EEG. -Neurology recommended Keppra 750 mg twice daily -Pyridoxine 50 mg daily for irritability side effect from Keppra -Seizure precaution discussed in detail.  See instruction below -Home health PT/OT ordered.  Has rolling walker. -Patient and family declined SLP eval   Paroxysmal A-fib: Rate controlled. -Continue metoprolol  and Eliquis      Normocytic anemia: Stable - Recheck CBC at follow-up.   Hyponatremia: Mild.  Improved.   Mild cognitive impairment  - Reorientation and delirium precautions.   Crohn's disease: Quiescent    Hyperbilirubinemia: Mild.  Stable.   Thrombocytopenia: Mild and stable.  Hypokalemia: Replenished prior to discharge  BPH - Continue Flomax     Body mass index is 24.26 kg/m.           Consultations: Neurology  Time spent 35  minutes  Vital signs  Vitals:   10/29/23 1934 10/30/23 0501 10/30/23 0825 10/30/23 1045  BP: 112/61 126/68 120/70 (!) 129/95  Pulse: 74 78 (!) 109 84  Temp: 97.6 F (36.4 C) 98.3 F (36.8 C) (!) 97.4 F (36.3 C)   Resp: 18 18 19    Height:      Weight:      SpO2: 98% 96% 100%   TempSrc:   Oral   BMI (Calculated):         Discharge exam  GENERAL: No apparent distress.  Nontoxic. HEENT: MMM.  Vision and hearing grossly  intact.  NECK: Supple.  No apparent JVD.  RESP:  No IWOB.  Fair aeration bilaterally. CVS:  RRR. Heart sounds normal.  ABD/GI/GU: BS+. Abd soft, NTND.  MSK/EXT:  Moves extremities. No apparent deformity. No edema.  SKIN: no apparent skin lesion or wound NEURO: Awake and alert. Oriented for except time.  No apparent focal neuro deficit. PSYCH: Calm. Normal affect.   Discharge Instructions Discharge Instructions     Discharge instructions   Complete by: As directed    It has been a pleasure taking care of you!  You were hospitalized for possible seizure for which you have been started on Keppra.  We strongly recommend taking your medications as prescribed.  Follow-up with neurology per their recommendation.  Follow-up with your primary care doctor in 1 to 2 weeks or sooner if needed.  Seizure Precautions: Per [place] statutes, patients with seizures are not allowed to drive until  they have been seizure-free for six months. Use caution when using heavy equipment or power tools. Avoid working on ladders or at heights. Take showers instead of baths. Ensure the water  temperature is not too high on the home water  heater. Do not go swimming alone. When caring for infants or small children, sit down when holding, feeding, or changing them to minimize risk of injury to the child in the event you have a seizure.  To reduce risk of seizures, maintain good sleep hygiene avoid alcohol and illicit drug use, take all anti-seizure medications as prescribed.     Take care,   Increase activity slowly   Complete by: As directed       Allergies as of 10/30/2023   No Known Allergies      Medication List     TAKE these medications    acetaminophen  500 MG tablet Commonly known as: TYLENOL  Take 500-1,000 mg by mouth every 8 (eight) hours as needed for mild pain or headache.   alendronate  70 MG tablet Commonly known as: FOSAMAX  Take 1 tablet (70 mg total) by mouth every 7 (seven)  days. Take with a full glass of water  on an empty stomach.   apixaban  5 MG Tabs tablet Commonly known as: Eliquis  Take 1 tablet (5 mg total) by mouth 2 (two) times daily.   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   B-D 3CC LUER-LOK SYR 25GX1 25G X 1 3 ML Misc Generic drug: SYRINGE-NEEDLE (DISP) 3 ML USE MONTHLY FOR B-12 INJECTIONS   cyanocobalamin  1000 MCG/ML injection Commonly known as: VITAMIN B12 INJECT 1ML INTO THE MUSCLE ONCE MONTHLY   desmopressin  0.1 MG tablet Commonly known as: DDAVP  Take 100 mcg by mouth daily.   dorzolamide -timolol  2-0.5 % ophthalmic solution Commonly known as: COSOPT  Place 1 drop into the left eye 2 (two) times daily.   latanoprost  0.005 % ophthalmic solution Commonly known  as: XALATAN  Place 1 drop into the left eye at bedtime.   levETIRAcetam 750 MG tablet Commonly known as: KEPPRA Take 1 tablet (750 mg total) by mouth 2 (two) times daily.   loperamide  2 MG tablet Commonly known as: IMODIUM  A-D Take 2 mg by mouth daily as needed for diarrhea or loose stools.   metoprolol  succinate 25 MG 24 hr tablet Commonly known as: TOPROL -XL TAKE HALF TABLET BY MOUTH DAILY   Netarsudil  Dimesylate 0.02 % Soln Place 1 drop into the left eye every evening.   omeprazole  20 MG tablet Commonly known as: PRILOSEC  OTC Take 20 mg by mouth as needed.   pyridOXINE 50 MG tablet Commonly known as: B-6 Take 1 tablet (50 mg total) by mouth daily. Start taking on: October 31, 2023   tamsulosin  0.4 MG Caps capsule Commonly known as: FLOMAX  Take 0.4 mg by mouth at bedtime.   vitamin C  1000 MG tablet Take 1,000 mg by mouth daily with breakfast.   Vitamin D3 50 MCG (2000 UT) Tabs Take 2,000 Units by mouth daily with breakfast.         Procedures/Studies:   Overnight EEG with video Result Date: 10/29/2023 Shelton Arlin KIDD, MD      10/30/2023 10:54 AM Patient Name: Michael [name] MRN: [id number] Epilepsy Attending: Arlin KIDD Shelton Referring Physician/Provider: Sallyann Normie HERO, MD Duration: 10/18/202 1745 to 10/29/2023 1126 Patient history:  89 y.o. male hx of A fib on Eliquis , crohns disease, glaucoma, TIA, memory impairments, B12 deficiency who was visiting his wife with his daughter at the bedside on 3W. LKW 1500. He was watching a volleyball game with his daughter when he acutely had a change. Per the daughter, He suddenly turned his head to the right and started picking  with his fingers and seeming to get agitated and was not speaking. EEG to evaluate for seizure Level of alertness: Awake, asleep AEDs during EEG study: None Technical aspects: This EEG study was done with scalp electrodes positioned according to the 10-20 International system of electrode placement. Electrical activity was reviewed with band pass filter of 1-70Hz , sensitivity of 7 uV/mm, display speed of 51mm/sec with a 60Hz  notched filter applied as appropriate. EEG data were recorded continuously and digitally stored.  Video monitoring was available and reviewed as appropriate. Description: The posterior dominant rhythm consists of 8-9 Hz activity of moderate voltage (25-35 uV) seen predominantly in posterior head regions, symmetric and reactive to eye opening and eye closing. Sleep was characterized by vertex waves, sleep spindles (12 to 14 Hz), maximal frontocentral region. Hyperventilation and photic stimulation were not performed.   EEG was unhooked between 10/28/2023 1814 to 1856 for MRI IMPRESSION: This study is within normal limits. No seizures or epileptiform discharges were seen throughout the recording. A normal interictal EEG does not exclude the diagnosis of epilepsy. Arlin KIDD Shelton   MR BRAIN WO CONTRAST Result Date: 10/28/2023 EXAM: MRI BRAIN WITHOUT CONTRAST 10/28/2023 09:49:22 PM TECHNIQUE: Multiplanar multisequence MRI of the head/brain was  performed without the administration of intravenous contrast. COMPARISON: Prior CTs from earlier the same day. CLINICAL HISTORY: Neuro deficit, acute, stroke suspected. FINDINGS: BRAIN AND VENTRICLES: No acute infarct. No intracranial hemorrhage. No mass. No midline shift. No hydrocephalus. Generalized age-related cerebral atrophy. Patchy T2/FLAIR hyperintensity involving the periventricular and deep white matter of both cerebral hemispheres, consistent with chronic small vessel ischemic disease. Few small remote lacunar infarcts present in the hemispheric cerebral white matter, left basal ganglia, and left thalamus. The sella is  unremarkable. Normal flow voids. ORBITS: Prior bilateral ocular lens replacement. SINUSES AND MASTOIDS: Changes of chronic right maxillary sinusitis noted. BONES AND SOFT TISSUES: Normal marrow signal. No acute soft tissue abnormality. IMPRESSION: 1. No acute intracranial abnormality. 2. Generalized age-related cerebral atrophy. 3. Chronic small vessel ischemic disease with a few small remote lacunar infarcts about the hemispheric cerebral white matter, left basal ganglia, and left thalamus. Electronically signed by: Morene Hoard MD 10/28/2023 10:32 PM EDT RP Workstation: [id number]   CT ANGIO HEAD NECK W WO CM Result Date: 10/28/2023 EXAM: CTA Head and Neck with Intravenous Contrast. CT Head without Contrast. CLINICAL HISTORY: Transient ischemic attack (TIA). TECHNIQUE: Axial CTA images of the head and neck performed with intravenous contrast. MIP reconstructed images were created and reviewed. Axial computed tomography images of the head/brain performed without intravenous contrast. 75 mL iohexol  (OMNIPAQUE ) 350 MG/ML injection. Note: Per PQRS, the description of internal carotid artery percent stenosis, including 0 percent or normal exam, is based on [place] American Symptomatic Carotid Endarterectomy Trial (NASCET) criteria. Dose reduction technique was used including one or  more of the following: automated exposure control, adjustment of mA and kV according to patient size, and/or iterative reconstruction. CONTRAST: With and without IV contrast; 75 mL iohexol  (OMNIPAQUE ) 350 MG/ML injection. COMPARISON: CT from earlier in the same day. FINDINGS: CT HEAD: BRAIN: No acute intraparenchymal hemorrhage. No mass lesion. No CT evidence for acute territorial infarct. No midline shift or extra-axial collection. Age-related cerebral atrophy with chronic microvascular ischemic disease. Calcified atherosclerosis present about the skull base. EEG leads overlie the scalp. VENTRICLES: No hydrocephalus. ORBITS: The orbits are unremarkable. SINUSES AND MASTOIDS: Changes of chronic right maxillary sinusitis noted. The remaining paranasal sinuses and mastoid air cells are clear. CTA NECK: COMMON CAROTID ARTERIES: Mild atheromatous change about the carotid bulbs bilaterally without hemodynamically significant stenosis. No dissection or occlusion. INTERNAL CAROTID ARTERIES: Mild atheromatous change about the carotid siphons without hemodynamically significant stenosis. No dissection or occlusion. VERTEBRAL ARTERIES: No significant stenosis. No dissection or occlusion. CTA HEAD: ANTERIOR CEREBRAL ARTERIES: No significant stenosis. No occlusion. No aneurysm. MIDDLE CEREBRAL ARTERIES: No significant stenosis. No occlusion. No aneurysm. POSTERIOR CEREBRAL ARTERIES: No significant stenosis. No occlusion. No aneurysm. BASILAR ARTERY: No significant stenosis. No occlusion. No aneurysm. OTHER: SOFT TISSUES: No acute finding. No masses or lymphadenopathy. BONES: Moderate multilevel cervical spondylosis. Osteoarthritic changes noted about the right TMJ. No acute osseous abnormality. LUNGS: Centrilobular emphysema. Superimposed patchy opacity at the peripheral aspects of both upper lobes. While this may in part reflects scarring, superimposed changes of mild pneumonitis slash small airway disease could be  contributory as well. IMPRESSION: 1. Negative CTA of the head and neck. No large vessel occlusion or other emergent finding. 2. Mild for age atheromatous disease about the carotid bulbs and carotid siphons without hemodynamically significant or correctable stenosis. 3. Negative noncontrast head CT. Previously questioned ischemic changes involving the right ACA distribution not appreciated on this exam. No other new acute intracranial abnormality. 4. Aortic atherosclerosis. Emphysema. 5. Patchy peripheral upper lobe opacities, likely scarring. Possible mild o changes needed pneumonitis or small airway disease could be considered in the correct clinical setting. Electronically signed by: Morene Hoard MD 10/28/2023 07:50 PM EDT RP Workstation: [id number]   CT HEAD CODE STROKE WO CONTRAST Result Date: 10/28/2023 EXAM: CT HEAD WITHOUT CONTRAST 10/28/2023 03:48:46 PM TECHNIQUE: CT of the head was performed without the administration of intravenous contrast. Automated exposure control, iterative reconstruction, and/or weight based adjustment of the mA/kV was utilized to  reduce the radiation dose to as low as reasonably achievable. COMPARISON: CT head 06/02/2022. CLINICAL HISTORY: Neuro deficit, acute, stroke suspected. FINDINGS: BRAIN AND VENTRICLES: No acute hemorrhage. Loss of gray-white differentiation in right frontal lobe. Remote lacunar infarct in left thalamus. Periventricular white matter changes, likely sequela of chronic small vessel ischemic disease. Atherosclerotic calcifications in intracranial carotid and vertebral arteries. No hydrocephalus. No extra-axial collection. No mass effect or midline shift. ORBITS: Bilateral lens replacements noted. No acute abnormality. SINUSES: Air-fluid level in right maxillary sinus. Remote fracture of right lamina papyracea. SOFT TISSUES AND SKULL: No acute soft tissue abnormality. No skull fracture. [place] stroke program early CT (aspect) score: Ganglionic  (caudate, ic, lentiform nucleus, insula, M1-m3): 7 Supraganglionic (m4-m6): 3 Total: 10 IMPRESSION: 1. Acute ischemic changes in the right frontal lobe with loss of gray-white differentiation. Findings concerning for acute cortical infarct in the right ACA territory. 2.  ASPECT score is 10. 3. Chronic small vessel ischemic disease. 4. Remote lacunar infarct in the left thalamus. 5. Findings messaged to Dr. Sallyann at 4:00PM on 10/28/23. Electronically signed by: Donnice Mania MD 10/28/2023 04:01 PM EDT RP Workstation: [id number]       The results of significant diagnostics from this hospitalization (including imaging, microbiology, ancillary and laboratory) are listed below for reference.     Microbiology: No results found for this or any previous visit (from the past 240 hours).   Labs:  CBC: Recent Labs  Lab 10/28/23 1615 10/29/23 0420 10/30/23 0924  WBC 7.0 5.8 6.3  NEUTROABS 4.1  --   --   HGB 10.2*  10.5* 10.3* 11.4*  HCT 31.0*  31.0* 30.5* 34.9*  MCV 88.1 87.4 88.4  PLT 131* 133* 133*   BMP &GFR Recent Labs  Lab 10/28/23 1615 10/29/23 0420 10/30/23 0924  NA 130*  131* 132* 135  K 4.0  4.1 3.8 3.3*  CL 96*  95* 99 100  CO2 25 24 24   GLUCOSE 98  94 97 114*  BUN 14  15 12 11   CREATININE 0.96  1.10 0.90 1.01  CALCIUM  8.7* 8.5* 8.9  MG  --   --  1.8   Estimated Creatinine Clearance: 52.8 mL/min (by C-G formula based on SCr of 1.01 mg/dL). Liver & Pancreas: Recent Labs  Lab 10/28/23 1615 10/29/23 0420  AST 16 15  ALT 12 9  ALKPHOS 53 50  BILITOT 1.5* 1.4*  PROT 6.3* 5.9*  ALBUMIN 3.3* 2.9*   No results for input(s): LIPASE, AMYLASE in the last 168 hours. Recent Labs  Lab 10/29/23 1505  AMMONIA 31   Diabetic: No results for input(s): HGBA1C in the last 72 hours. Recent Labs  Lab 10/28/23 1516 10/28/23 1808  GLUCAP 90 75   Cardiac Enzymes: No results for input(s): CKTOTAL, CKMB, CKMBINDEX, TROPONINI in the last 168 hours. Recent  Labs    10/02/23 1542  PROBNP 242.0*   Coagulation Profile: Recent Labs  Lab 10/28/23 1615  INR 1.3*   Thyroid  Function Tests: Recent Labs    10/29/23 0420  TSH 1.864   Lipid Profile: No results for input(s): CHOL, HDL, LDLCALC, TRIG, CHOLHDL, LDLDIRECT in the last 72 hours. Anemia Panel: Recent Labs    10/29/23 1505  VITAMINB12 397   Urine analysis:    Component Value Date/Time   COLORURINE STRAW (A) 10/29/2023 1655   APPEARANCEUR CLEAR 10/29/2023 1655   LABSPEC 1.006 10/29/2023 1655   PHURINE 7.0 10/29/2023 1655   GLUCOSEU NEGATIVE 10/29/2023 1655   GLUCOSEU NEGATIVE 05/08/2019 1623  HGBUR NEGATIVE 10/29/2023 1655   BILIRUBINUR NEGATIVE 10/29/2023 1655   BILIRUBINUR negative 05/11/2022 1655   KETONESUR NEGATIVE 10/29/2023 1655   PROTEINUR NEGATIVE 10/29/2023 1655   UROBILINOGEN 0.2 05/11/2022 1655   UROBILINOGEN 0.2 05/08/2019 1623   NITRITE NEGATIVE 10/29/2023 1655   LEUKOCYTESUR NEGATIVE 10/29/2023 1655   Sepsis Labs: Invalid input(s): PROCALCITONIN, LACTICIDVEN   SIGNED:  Teren Franckowiak T Taeler Winning, MD  [facility] 10/30/2023, 4:28 PM

## 2023-10-30 NOTE — Plan of Care (Signed)
  Problem: Education: Goal: Knowledge of General Education information will improve Description: Including pain rating scale, medication(s)/side effects and non-pharmacologic comfort measures Outcome: Progressing   Problem: Health Behavior/Discharge Planning: Goal: Ability to manage health-related needs will improve Outcome: Progressing   Problem: Clinical Measurements: Goal: Ability to maintain clinical measurements within normal limits will improve Outcome: Progressing Goal: Will remain free from infection Outcome: Progressing Goal: Diagnostic test results will improve Outcome: Progressing Goal: Respiratory complications will improve Outcome: Progressing Goal: Cardiovascular complication will be avoided Outcome: Progressing   Problem: Activity: Goal: Risk for activity intolerance will decrease Outcome: Progressing   Problem: Elimination: Goal: Will not experience complications related to bowel motility Outcome: Progressing Goal: Will not experience complications related to urinary retention Outcome: Progressing   Problem: Pain Managment: Goal: General experience of comfort will improve and/or be controlled Outcome: Progressing   Problem: Skin Integrity: Goal: Risk for impaired skin integrity will decrease Outcome: Progressing

## 2023-10-30 NOTE — Progress Notes (Signed)
 10/30/2023 1:48 PM -----------------------------------------------------------[facility]--------------------------------------------------- D(Data) A(Action) R(response)     Data: Discharge Readiness Assessment EDD     Action: Chart reviewed. EPIC Secure Chat with RN and PT.    Response: No immediate Barriers to discharge identified at this time. Per RN pt. Has rolling walker at home (recommended by PT) and is set up with PT an OT through the [facility] per prior TOC notes. Per report, pt. Still having some confusion so pt. Is not discharge lounge appropriate at this time.      Latoi Giraldo, RN [employer]

## 2023-10-30 NOTE — Plan of Care (Signed)
   Problem: Clinical Measurements: Goal: Ability to maintain clinical measurements within normal limits will improve Outcome: Progressing Goal: Will remain free from infection Outcome: Progressing

## 2023-10-30 NOTE — Evaluation (Signed)
 Physical Therapy Evaluation and Discharge Patient Details Name: Michael [name] MRN: [id number] DOB: [birth year] Today's Date: 10/30/2023  History of Present Illness  Pt is an 89 y.o. male presenting 10/18 with acute onset aphasia while visiting wife. MRI brain negative. EEG within normal limits. PMH: chron's disease, atrial fibrillation on Eliquis , and TIAs  Clinical Impression  Patient evaluated by Physical Therapy with no further acute PT needs identified, as plan is for pt to dc today; He was able to walk greater than household distances with RW; Called pt's daughter, who informed me of the plan for pt to dc to [facility]; Agree with pt's daughter to look into maximizing services in the home;  All education has been completed and the patient has no further questions.  See below for any follow-up Physical Therapy or equipment needs. PT is signing off. Thank you for this referral.         If plan is discharge home, recommend the following: Assistance with cooking/housework   Can travel by private vehicle        Equipment Recommendations Rolling walker (2 wheels)  Recommendations for Other Services       Functional Status Assessment Patient has had a recent decline in their functional status and demonstrates the ability to make significant improvements in function in a reasonable and predictable amount of time.     Precautions / Restrictions Precautions Precautions: Fall Recall of Precautions/Restrictions: Intact      Mobility  Bed Mobility                    Transfers Overall transfer level: Needs assistance Equipment used: Rolling walker (2 wheels) Transfers: Sit to/from Stand Sit to Stand: Contact guard assist           General transfer comment: CGA for safety    Ambulation/Gait Ambulation/Gait assistance: Contact guard assist Gait Distance (Feet): 600 Feet Assistive device: Rolling walker (2 wheels) Gait Pattern/deviations: Step-through  pattern, Trunk flexed Gait velocity: cues to slow down for control     General Gait Details: RW adjusted for optimal fit; Cues for upright posture; seemed to enjoy walking  Stairs            Wheelchair Mobility     Tilt Bed    Modified Rankin (Stroke Patients Only)       Balance Overall balance assessment: Needs assistance Sitting-balance support: No upper extremity supported, Feet supported Sitting balance-Leahy Scale: Good     Standing balance support: Bilateral upper extremity supported, No upper extremity supported, During functional activity Standing balance-Leahy Scale: Poor Standing balance comment: reliant on device                             Pertinent Vitals/Pain      Home Living Family/patient expects to be discharged to:: Private residence Living Arrangements: Alone Available Help at Discharge: Family;Available PRN/intermittently Type of Home: House (town home) Home Access: Level entry       Home Layout: One level Home Equipment: Agricultural consultant (2 wheels);Shower seat      Prior Function Prior Level of Function : Independent/Modified Independent             Mobility Comments: RW ADLs Comments: aide 3 hr 5 days/week assists mostly with home making and meals     Extremity/Trunk Assessment   Upper Extremity Assessment Upper Extremity Assessment: Defer to OT evaluation    Lower Extremity Assessment Lower Extremity Assessment:  Generalized weakness       Communication   Communication Communication: Impaired Factors Affecting Communication: Hearing impaired    Cognition Arousal: Alert Behavior During Therapy: WFL for tasks assessed/performed                             Following commands: Impaired Following commands impaired: Follows one step commands with increased time, Follows multi-step commands inconsistently     Cueing Cueing Techniques: Verbal cues     General Comments General comments (skin  integrity, edema, etc.): Pleasant and engaged in conversation; Talked about his wife, became tearful    Exercises     Assessment/Plan    PT Assessment All further PT needs can be met in the next venue of care  PT Problem List Decreased balance;Decreased mobility;Decreased knowledge of use of DME;Decreased safety awareness       PT Treatment Interventions      PT Goals (Current goals can be found in the Care Plan section)  Acute Rehab PT Goals Patient Stated Goal: To see his wife PT Goal Formulation: All assessment and education complete, DC therapy    Frequency       Co-evaluation               AM-PAC PT 6 Clicks Mobility  Outcome Measure Help needed turning from your back to your side while in a flat bed without using bedrails?: None Help needed moving from lying on your back to sitting on the side of a flat bed without using bedrails?: None Help needed moving to and from a bed to a chair (including a wheelchair)?: None Help needed standing up from a chair using your arms (e.g., wheelchair or bedside chair)?: None Help needed to walk in hospital room?: None Help needed climbing 3-5 steps with a railing? : A Little 6 Click Score: 23    End of Session   Activity Tolerance: Patient tolerated treatment well Patient left: in chair;with call bell/phone within reach;with family/visitor present Nurse Communication: Mobility status PT Visit Diagnosis: Unsteadiness on feet (R26.81)    Time: 8840-8769 PT Time Calculation (min) (ACUTE ONLY): 31 min   Charges:   PT Evaluation $PT Eval Low Complexity: 1 Low PT Treatments $Gait Training: 8-22 mins PT General Charges $$ ACUTE PT VISIT: 1 Visit         Silvano Currier, PT  Acute Rehabilitation Services Office [phone] Secure Chat welcomed   Silvano VEAR Currier 10/30/2023, 1:53 PM

## 2023-10-30 NOTE — Progress Notes (Signed)
 NEUROLOGY CONSULT FOLLOW UP NOTE   Date of service: October 30, 2023 Patient Name: Michael [name] MRN:  [id number] DOB:  08/25/1932  Interval Hx/subjective   Back to baseline. Continues on Keppra and B6.   Vitals   Vitals:   10/29/23 1444 10/29/23 1934 10/30/23 0501 10/30/23 0825  BP: 131/73 112/61 126/68 120/70  Pulse: 68 74 78 (!) 109  Resp: 12 18 18 19   Temp: 97.8 F (36.6 C) 97.6 F (36.4 C) 98.3 F (36.8 C) (!) 97.4 F (36.3 C)  TempSrc: Oral   Oral  SpO2: 98% 98% 96% 100%  Weight: 78.9 kg     Height: 5' 11 (1.803 [name])        Body mass index is 24.26 kg/[name].  Physical Exam   Constitutional: Appears well-developed and well-nourished.   Psych: Affect appropriate to situation.   Eyes: No scleral injection.   HENT: No OP obstruction.   Head: Normocephalic.   Cardiovascular: Normal rate and regular rhythm.   Respiratory: Effort normal, non-labored breathing.   GI: Soft.  No distension. There is no tenderness.   Skin: WDI.    Neurologic Examination   Mental status: alert, oriented to person, place and time. Able to provide history. Speech: no dysarthria, word-finding difficulty, paraphasic errors. Cranial nerves: PERRL EOMI VF full Face sensation intact bilaterally. Face symmetric at rest and with activation. Hearing grossly intact. Palate elevation symmetric Tongue protrudes midline and has full range of motion. SCM'[name] full strength bilaterally. Motor: Normal bulk and tone. No abnormal movements 5/5 throughout Sensory: Grossly intact to light touch throughout. Reflexes: DTR'[name] deferred. Coordination: FTN intact.  Gait: deferred   Medications  Current Facility-Administered Medications:    acetaminophen  (TYLENOL ) tablet 650 mg, 650 mg, Oral, Q6H PRN **OR** acetaminophen  (TYLENOL ) suppository 650 mg, 650 mg, Rectal, Q6H PRN, Michael [name], Michael RAMAN, MD   apixaban  (ELIQUIS ) tablet 5 mg, 5 mg, Oral, BID, Michael [name], Michael S, MD, 5 mg at 10/29/23 2218    desmopressin  (DDAVP ) tablet 100 mcg, 100 mcg, Oral, QHS, Michael [name], Michael T, MD, 100 mcg at 10/29/23 2218   dorzolamide -timolol  (COSOPT ) 2-0.5 % ophthalmic solution 1 drop, 1 drop, Left Eye, BID, Michael [name], Michael T, MD, 1 drop at 10/29/23 2221   Influenza vac split trivalent PF (FLUZONE HIGH-DOSE) injection 0.5 mL, 0.5 mL, Intramuscular, Tomorrow-1000, Michael [name], Michael T, MD   latanoprost  (XALATAN ) 0.005 % ophthalmic solution 1 drop, 1 drop, Left Eye, QHS, Michael [name], Michael T, MD, 1 drop at 10/29/23 2218   levETIRAcetam (KEPPRA) tablet 750 mg, 750 mg, Oral, BID, Michael [name], Michael M, MD   metoprolol  succinate (TOPROL -XL) 24 hr tablet 12.5 mg, 12.5 mg, Oral, Daily, Michael [name], Michael S, MD, 12.5 mg at 10/29/23 1102   ondansetron  (ZOFRAN ) tablet 4 mg, 4 mg, Oral, Q6H PRN **OR** ondansetron  (ZOFRAN ) injection 4 mg, 4 mg, Intravenous, Q6H PRN, Michael [name], Michael S, MD   pyridOXINE (VITAMIN B6) tablet 50 mg, 50 mg, Oral, Daily, Michael [name], Michael M, MD, 50 mg at 10/29/23 1701   senna-docusate (Senokot-[name]) tablet 1 tablet, 1 tablet, Oral, QHS PRN, Michael [name], Michael RAMAN, MD   sodium chloride  flush (NS) 0.9 % injection 3 mL, 3 mL, Intravenous, Q12H, Michael [name], Michael S, MD, 3 mL at 10/29/23 2221   tamsulosin  (FLOMAX ) capsule 0.4 mg, 0.4 mg, Oral, QHS, Michael [name], Michael T, MD, 0.4 mg at 10/29/23 2218  Labs and Diagnostic Imaging   CBC:  Recent Labs  Lab 10/28/23 1615 10/29/23 0420 10/30/23 0924  WBC 7.0 5.8 6.3  NEUTROABS 4.1  --   --  HGB 10.2*  10.5* 10.3* 11.4*  HCT 31.0*  31.0* 30.5* 34.9*  MCV 88.1 87.4 88.4  PLT 131* 133* 133*    Basic Metabolic Panel:  Lab Results  Component Value Date   NA 135 10/30/2023   K 3.3 (L) 10/30/2023   CO2 24 10/30/2023   GLUCOSE 114 (H) 10/30/2023   BUN 11 10/30/2023   CREATININE 1.01 10/30/2023   CALCIUM  8.9 10/30/2023   GFRNONAA >60 10/30/2023   GFRAA >60 09/11/2019   Lipid Panel:  Lab Results  Component Value Date   LDLCALC 32 02/21/2023   HgbA1c:  Lab Results  Component Value Date   HGBA1C 5.6  01/14/2021   Urine Drug Screen:     Component Value Date/Time   LABOPIA NONE DETECTED 10/29/2023 1655   COCAINSCRNUR NONE DETECTED 10/29/2023 1655   LABBENZ NONE DETECTED 10/29/2023 1655   AMPHETMU NONE DETECTED 10/29/2023 1655   THCU NONE DETECTED 10/29/2023 1655   LABBARB NONE DETECTED 10/29/2023 1655    Alcohol Level     Component Value Date/Time   ETH <15 10/28/2023 1615   INR  Lab Results  Component Value Date   INR 1.3 (H) 10/28/2023   APTT  Lab Results  Component Value Date   APTT 40 (H) 10/28/2023   AED levels: No results found for: PHENYTOIN, ZONISAMIDE, LAMOTRIGINE, LEVETIRACETA  CT Head without contrast(Personally reviewed): Hypodensity in R frontal lobe.  CT angio Head and Neck with contrast(Personally reviewed): No significant intracranial stenosis of the L MCA  MRI Brain(Personally reviewed): No acute abnormality.  rEEG:  No seizure or epileptiform discharges or focal slowing  Assessment   Michael [name] is a 89 y.o. male with A fib on Eliquis , crohns disease, glaucoma, TIA, memory impairments, B12 deficiency who presented with seizure-like activity: He suddenly turned his head to the right and started picking  with his fingers and seeming to get agitated and was not speaking.  Prior presentations called TIA'[name] of similar semiology. CTA without intracranial stenosis of L MCA to explain symptoms. [facility] radiology report noted acute infarct of R frontal lobe, not seen on subsequent MRI brain. EEG without seizure or epileptiform discharges, however only routine and patient did not have episode during it. Episodes concerning for seizures--Keppra load given 10/19, maintenance dose started.   No further episodes since.  Recommendations  - Keppra 30mg /kg load followed by 750mg  BID maintenance - Vitamin B6 @ 50mg  for irritability side effect (can increase to 100mg  if needed) - neurology will signoff. Please feel free to contact us  with any questions or  concerns. ______________________________________________________________________    Pt seen by Neuro NP/APP and later by MD. Note/plan to be edited by MD as needed.    Michael JAYSON Likes, DNP [facility] Please use AMION for contact information & EPIC for messaging.   NEUROHOSPITALIST ADDENDUM Performed a face to face diagnostic evaluation.   I have reviewed the contents of history and physical exam as documented by PA/ARNP/Resident and agree with above documentation.  I have discussed and formulated the above plan as documented. Edits to the note have been made as needed.  I called daughter and spoke with her on the phone. I also discussed with Dr. Gonfa with the Hospitalist team. Okay for discharge from a neurology standpoint.  Michael Ramroop, MD [facility] [phone]   If 7pm to 7am, please call on call as listed on AMION.

## 2023-10-30 NOTE — Progress Notes (Signed)
 Mobility Specialist Progress Note:    10/30/23 1015  Mobility  Activity Ambulated with assistance  Level of Assistance Minimal assist, patient does 75% or more  Assistive Device Front wheel walker  Distance Ambulated (ft) 300 ft  Activity Response Tolerated well  Mobility Referral Yes  Mobility visit 1 Mobility  Mobility Specialist Start Time (ACUTE ONLY) 1000  Mobility Specialist Stop Time (ACUTE ONLY) 1015  Mobility Specialist Time Calculation (min) (ACUTE ONLY) 15 min   Pt received in bed eager and agreeable to mobility. No c/o throughout. Pt required MinA for STS, contact guard for ambulation. Returned to room w/o fault. Left in chair w/ call bell and personal belongings in reach. All needs met. Chair alarm on and family in room.  Thersia Minder Mobility Specialist  Please contact vis Secure Chat or  Rehab Office [phone]) [phone]]

## 2023-10-31 ENCOUNTER — Telehealth: Payer: Self-pay

## 2023-10-31 NOTE — Transitions of Care (Post Inpatient/ED Visit) (Signed)
 10/31/2023  Name: Michael [name] MRN: [id number] DOB: 1933-01-08  Today's TOC FU Call Status: Today's TOC FU Call Status:: Successful TOC FU Call Completed TOC FU Call Complete Date: 10/31/23 Patient's Name and Date of Birth confirmed.  Transition Care Management Follow-up Telephone Call Date of Discharge: 10/30/23 Discharge Facility: [facility]) Type of Discharge: Inpatient Admission Primary Inpatient Discharge Diagnosis:: seizure How have you been since you were released from the hospital?: Better Any questions or concerns?: No  Items Reviewed: Did you receive and understand the discharge instructions provided?: Yes Medications obtained,verified, and reconciled?: Yes (Medications Reviewed) Any new allergies since your discharge?: No Dietary orders reviewed?: Yes Do you have support at home?: Yes People in Home [RPT]: facility resident  Medications Reviewed Today: Medications Reviewed Today     Reviewed by Emmitt Pan, LPN (Licensed Practical Nurse) on 10/31/23 at 1418  Med List Status: <None>   Medication Order Taking? Sig Documenting Provider Last Dose Status Informant  acetaminophen  (TYLENOL ) 500 MG tablet [id number] Yes Take 500-1,000 mg by mouth every 8 (eight) hours as needed for mild pain or headache.  [provider]  Active Child  alendronate  (FOSAMAX ) 70 MG tablet [id number] Yes Take 1 tablet (70 mg total) by mouth every 7 (seven) days. Take with a full glass of water  on an empty stomach. [name], MD  Active Child           Med Note (MARROW, ROCKY ONEIDA Repress Oct 29, 2023  7:18 AM) Pt's daughter did not specify what day of the week the pt takes this medication  apixaban  (ELIQUIS ) 5 MG TABS tablet [id number] Yes Take 1 tablet (5 mg total) by mouth 2 (two) times daily. Verlin Lonni BIRCH, MD  Active Child           Med Note (MARROW, ROCKY ONEIDA Repress Oct 29, 2023  7:19 AM) [facility] LF 06/25/23 #180.   Ascorbic Acid  (VITAMIN C ) 1000 MG  tablet [id number] Yes Take 1,000 mg by mouth daily with breakfast.  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  [provider]  Active Child  B-D 3CC LUER-LOK SYR 25GX1 25G X 1 3 ML MISC [id number] Yes USE MONTHLY FOR B-12 INJECTIONS Burns, Glade PARAS, MD  Active Child  Cholecalciferol  (VITAMIN D3) 50 MCG (2000 UT) TABS [id number] Yes Take 2,000 Units by mouth daily with breakfast. [provider]  Active Child  cyanocobalamin  (VITAMIN B12) 1000 MCG/ML injection [id number] Yes INJECT INTO THE MUSCLE ONCE MONTHLY [name], MD  Active Child           Med Note (MARROW, ROCKY ONEIDA Repress Oct 29, 2023  7:21 AM) Per the pt's daughter the pt will receive his next injection on 11/11/2023  desmopressin  (DDAVP ) 0.1 MG tablet [id number] Yes Take 100 mcg by mouth daily. [provider]  Active Child  dorzolamide -timolol  (COSOPT ) 22.3-6.8 MG/ML ophthalmic solution [id number] Yes Place 1 drop into the left eye 2 (two) times daily. [provider]  Active Child           Med Note (MARROW, ROCKY ONEIDA Repress Oct 29, 2023  7:23 AM) [facility] : LF 09/13/23 #4mL.  latanoprost  (XALATAN ) 0.005 % ophthalmic solution [id number] Yes Place 1 drop into the left eye at bedtime. [provider]  Active Child  Med Note (MARROW, ROCKY ONEIDA Repress Oct 29, 2023  7:24 AM) [facility] : LF 02/27/23 #7.79mL.  levETIRAcetam (KEPPRA) 750 MG tablet [id number] Yes Take 1 tablet (750 mg total) by mouth 2 (two) times daily. Gonfa, Taye T, MD  Active   loperamide  (IMODIUM  A-D) 2 MG tablet [id number] Yes Take 2 mg by mouth daily as needed for diarrhea or loose stools. [provider]  Active Child  metoprolol  succinate (TOPROL -XL) 25 MG 24 hr tablet [id number] Yes TAKE HALF TABLET BY MOUTH DAILY Verlin Lonni BIRCH, MD  Active Child   Netarsudil  Dimesylate 0.02 % SOLN [id number] Yes Place 1 drop into the left eye every evening. [provider]  Active Child           Med Note (MARROW, ROCKY ONEIDA Repress Oct 29, 2023  7:25 AM) [facility] : LF 09/13/23 #2.46mL.   omeprazole  (PRILOSEC  OTC) 20 MG tablet [id number] Yes Take 20 mg by mouth as needed. [provider]  Active Child  pyridOXINE (B-6) 50 MG tablet [id number] Yes Take 1 tablet (50 mg total) by mouth daily. Gonfa, Taye T, MD  Active   tamsulosin  (FLOMAX ) 0.4 MG CAPS capsule [id number] Yes Take 0.4 mg by mouth at bedtime. [provider]  Active Child           Med Note (MARROW, ROCKY ONEIDA Repress Oct 29, 2023  7:33 AM) Per the pt's daughter the pt has this medication available at home if needed; however, he has not had to take any recently             Home Care and Equipment/Supplies:    Functional Questionnaire: Do you need assistance with bathing/showering or dressing?: Yes Do you need assistance with meal preparation?: Yes Do you need assistance with eating?: No Do you have difficulty maintaining continence: No Do you need assistance with getting out of bed/getting out of a chair/moving?: No Do you have difficulty managing or taking your medications?: No  Follow up appointments reviewed: PCP Follow-up appointment confirmed?: Yes Date of PCP follow-up appointment?: 11/06/23 Follow-up Provider: [facility] Follow-up appointment confirmed?: NA Do you need transportation to your follow-up appointment?: No Do you understand care options if your condition(s) worsen?: Yes-patient verbalized understanding    SIGNATURE Julian Lemmings, LPN [facility] Nurse Health Advisor Direct Dial [phone]

## 2023-11-02 ENCOUNTER — Telehealth: Payer: Self-pay

## 2023-11-02 NOTE — Telephone Encounter (Signed)
 Copied from CRM [id number]. Topic: General - Other >> Nov 02, 2023  9:46 AM Vena HERO wrote: Reason for CRM: [id number] Michael [name], calling for Dr Geofm nurse to call back. Wants to discuss a medication received at a hospital visit that she says is making the pt too groggy and would like to talk about it. Please call pt daughter at number provided

## 2023-11-05 ENCOUNTER — Encounter: Payer: Self-pay | Admitting: Internal Medicine

## 2023-11-05 DIAGNOSIS — R569 Unspecified convulsions: Secondary | ICD-10-CM | POA: Insufficient documentation

## 2023-11-05 DIAGNOSIS — G40909 Epilepsy, unspecified, not intractable, without status epilepticus: Secondary | ICD-10-CM | POA: Insufficient documentation

## 2023-11-05 NOTE — Progress Notes (Unsigned)
 Subjective:    Patient ID: Michael [name], male    DOB: January 16, 1932, 89 y.o.   MRN: [id number]     HPI Michael [name] is here for follow up from the hospital.   Admitted 10/19 - 10/20  Admitted From: Home Disposition: Home with home health PT/OT Recommendations for Outpatient Follow-up:  Outpatient follow-up with neurology in 4 to 6 weeks Follow-up with PCP in 1 to 2 weeks Check CMP and CBC at follow-up  He has Afib on eliquis  and h/o TIA who presented with aphasia, turning his head to the right, difficulty speaking, restlessness, agitation and picking at his hands while he was visiting his wife who is in the hospital.  Code stroke activated, but aborted by neurology.  CT head, CT angio head and neck and MRI brain without acute finding.  He was loaded with Keppra 3 g.  EEG ordered-admitted for encephalopathy.  EEG normal, but he has recurrent episodes concerning for seizure and he was placed on Keppra.  Neurology recommended vitamin B6 50 mg daily for irritability side effect.   Acute encephalopathy, seizure-like activity: Encephalopathy seem to have resolved Workup including TSH, B12, ammonia, RPR, UDS and UA unrevealing CT head, CT angio head and neck, stat MRI brain without acute finding-old lacunar infarcts noted Neuroexam intact EEG negative for seizure, but suspicion for seizure remained high Neurology recommended Keppra 750 mg twice daily, B6 50 mg daily for irritability side effect of Keppra Seizure precaution Home health PT/OT, family declined SLP eval Uses rolling walker   Paroxysmal atrial fibrillation: Rate controlled Continued on metoprolol  and Eliquis   Normocytic anemia: Stable Recheck CBC at follow-up  Hyponatremia: Mild-improved  Mild cognitive impairment: Reorientation and delirium precautions  Crohn's disease: Quiescent Hyperbilirubinemia: Mild, stable Thrombocytopenia: Mild, stable Hypokalemia replenished prior to discharge BPH: Stable,  continue Flomax     Medications and allergies reviewed with patient and updated if appropriate.  Current Outpatient Medications on File Prior to Visit  Medication Sig Dispense Refill   acetaminophen  (TYLENOL ) 500 MG tablet Take 500-1,000 mg by mouth every 8 (eight) hours as needed for mild pain or headache.      alendronate  (FOSAMAX ) 70 MG tablet Take 1 tablet (70 mg total) by mouth every 7 (seven) days. Take with a full glass of water  on an empty stomach. 12 tablet 3   apixaban  (ELIQUIS ) 5 MG TABS tablet Take 1 tablet (5 mg total) by mouth 2 (two) times daily. 60 tablet 5   Ascorbic Acid  (VITAMIN C ) 1000 MG tablet Take 1,000 mg by mouth daily with breakfast.      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      B-D 3CC LUER-LOK SYR 25GX1 25G X 1 3 ML MISC USE MONTHLY FOR B-12 INJECTIONS 12 each 0   Cholecalciferol  (VITAMIN D3) 50 MCG (2000 UT) TABS Take 2,000 Units by mouth daily with breakfast.     cyanocobalamin  (VITAMIN B12) 1000 MCG/ML injection INJECT 1ML INTO THE MUSCLE ONCE MONTHLY 10 mL 0   desmopressin  (DDAVP ) 0.1 MG tablet Take 100 mcg by mouth daily.     dorzolamide -timolol  (COSOPT ) 22.3-6.8 MG/ML ophthalmic solution Place 1 drop into the left eye 2 (two) times daily.     latanoprost  (XALATAN ) 0.005 % ophthalmic solution Place 1 drop into the left eye at bedtime.     levETIRAcetam (KEPPRA) 750 MG tablet Take 1 tablet (750 mg total) by mouth 2 (two) times  daily. 180 tablet 0   loperamide  (IMODIUM  A-D) 2 MG tablet Take 2 mg by mouth daily as needed for diarrhea or loose stools.     metoprolol  succinate (TOPROL -XL) 25 MG 24 hr tablet TAKE HALF TABLET BY MOUTH DAILY 45 tablet 3   Netarsudil  Dimesylate 0.02 % SOLN Place 1 drop into the left eye every evening.     omeprazole  (PRILOSEC  OTC) 20 MG tablet Take 20 mg by mouth as needed.     pyridOXINE (B-6) 50 MG tablet Take 1 tablet (50 mg  total) by mouth daily. 90 tablet 0   tamsulosin  (FLOMAX ) 0.4 MG CAPS capsule Take 0.4 mg by mouth at bedtime.     No current facility-administered medications on file prior to visit.     Review of Systems     Objective:  There were no vitals filed for this visit. BP Readings from Last 3 Encounters:  10/30/23 (!) 129/95  10/02/23 122/70  09/22/23 100/81   Wt Readings from Last 3 Encounters:  10/29/23 173 lb 15.1 oz (78.9 kg)  10/02/23 167 lb (75.8 kg)  09/20/23 170 lb (77.1 kg)   There is no height or weight on file to calculate BMI.    Physical Exam     Lab Results  Component Value Date   WBC 6.3 10/30/2023   HGB 11.4 (L) 10/30/2023   HCT 34.9 (L) 10/30/2023   PLT 133 (L) 10/30/2023   GLUCOSE 114 (H) 10/30/2023   CHOL 82 02/21/2023   TRIG 92.0 02/21/2023   HDL 31.40 (L) 02/21/2023   LDLDIRECT 60.6 09/11/2019   LDLCALC 32 02/21/2023   ALT 9 10/29/2023   AST 15 10/29/2023   NA 135 10/30/2023   K 3.3 (L) 10/30/2023   CL 100 10/30/2023   CREATININE 1.01 10/30/2023   BUN 11 10/30/2023   CO2 24 10/30/2023   TSH 1.864 10/29/2023   INR 1.3 (H) 10/28/2023   HGBA1C 5.6 01/14/2021     Assessment & Plan:    See Problem List for Assessment and Plan of chronic medical problems.

## 2023-11-05 NOTE — Patient Instructions (Addendum)
      Blood work was ordered.       Medications changes include :   start sertraline 25 mg daily    A referral was ordered for Dr Ines [facility] and someone will call you to schedule an appointment.     Return in about 3 months (around 02/06/2024) for follow up.

## 2023-11-05 NOTE — Assessment & Plan Note (Signed)
 Chronic Normocytic Stable CBC, CMP

## 2023-11-06 ENCOUNTER — Ambulatory Visit: Admitting: Internal Medicine

## 2023-11-06 ENCOUNTER — Ambulatory Visit: Payer: Self-pay | Admitting: Internal Medicine

## 2023-11-06 VITALS — BP 124/80 | HR 83 | Temp 98.0°F | Ht 71.0 in | Wt 162.0 lb

## 2023-11-06 DIAGNOSIS — Z8673 Personal history of transient ischemic attack (TIA), and cerebral infarction without residual deficits: Secondary | ICD-10-CM

## 2023-11-06 DIAGNOSIS — I480 Paroxysmal atrial fibrillation: Secondary | ICD-10-CM

## 2023-11-06 DIAGNOSIS — R531 Weakness: Secondary | ICD-10-CM | POA: Diagnosis not present

## 2023-11-06 DIAGNOSIS — R569 Unspecified convulsions: Secondary | ICD-10-CM | POA: Diagnosis not present

## 2023-11-06 DIAGNOSIS — G9340 Encephalopathy, unspecified: Secondary | ICD-10-CM

## 2023-11-06 DIAGNOSIS — I10 Essential (primary) hypertension: Secondary | ICD-10-CM

## 2023-11-06 DIAGNOSIS — D649 Anemia, unspecified: Secondary | ICD-10-CM

## 2023-11-06 DIAGNOSIS — G3184 Mild cognitive impairment, so stated: Secondary | ICD-10-CM | POA: Diagnosis not present

## 2023-11-06 DIAGNOSIS — D696 Thrombocytopenia, unspecified: Secondary | ICD-10-CM | POA: Diagnosis not present

## 2023-11-06 LAB — COMPREHENSIVE METABOLIC PANEL WITH GFR
ALT: 11 U/L (ref 0–53)
AST: 15 U/L (ref 0–37)
Albumin: 3.8 g/dL (ref 3.5–5.2)
Alkaline Phosphatase: 53 U/L (ref 39–117)
BUN: 17 mg/dL (ref 6–23)
CO2: 26 meq/L (ref 19–32)
Calcium: 8.9 mg/dL (ref 8.4–10.5)
Chloride: 99 meq/L (ref 96–112)
Creatinine, Ser: 1.1 mg/dL (ref 0.40–1.50)
GFR: 59.39 mL/min — ABNORMAL LOW (ref >60.00)
Glucose, Bld: 99 mg/dL (ref 70–99)
Potassium: 4 meq/L (ref 3.5–5.1)
Sodium: 133 meq/L — ABNORMAL LOW (ref 135–145)
Total Bilirubin: 0.9 mg/dL (ref 0.2–1.2)
Total Protein: 6.9 g/dL (ref 6.0–8.3)

## 2023-11-06 LAB — CBC WITH DIFFERENTIAL/PLATELET
Basophils Absolute: 0.1 K/uL (ref 0.0–0.1)
Basophils Relative: 1 % (ref 0.0–3.0)
Eosinophils Absolute: 0.1 K/uL (ref 0.0–0.7)
Eosinophils Relative: 1.9 % (ref 0.0–5.0)
HCT: 32.3 % — ABNORMAL LOW (ref 39.0–52.0)
Hemoglobin: 10.8 g/dL — ABNORMAL LOW (ref 13.0–17.0)
Lymphocytes Relative: 33.5 % (ref 12.0–46.0)
Lymphs Abs: 2.6 K/uL (ref 0.7–4.0)
MCHC: 33.5 g/dL (ref 30.0–36.0)
MCV: 87.2 fl (ref 78.0–100.0)
Monocytes Absolute: 0.7 K/uL (ref 0.1–1.0)
Monocytes Relative: 9.4 % (ref 3.0–12.0)
Neutro Abs: 4.1 K/uL (ref 1.4–7.7)
Neutrophils Relative %: 54.2 % (ref 43.0–77.0)
Platelets: 151 K/uL (ref 150.0–400.0)
RBC: 3.7 Mil/uL — ABNORMAL LOW (ref 4.22–5.81)
RDW: 22.2 % — ABNORMAL HIGH (ref 11.5–15.5)
WBC: 7.6 K/uL (ref 4.0–10.5)

## 2023-11-06 MED ORDER — SERTRALINE HCL 25 MG PO TABS: 25.0000 mg | ORAL_TABLET | Freq: Every day | ORAL | 3 refills | Status: DC

## 2023-11-06 MED ORDER — METOPROLOL SUCCINATE ER 25 MG PO TB24: 12.5000 mg | ORAL_TABLET | Freq: Every day | ORAL | 3 refills | Status: AC

## 2023-11-06 NOTE — Assessment & Plan Note (Signed)
Chronic CBC

## 2023-11-06 NOTE — Assessment & Plan Note (Signed)
 History of TIA Currently on Eliquis  5 mg twice daily-continue

## 2023-11-06 NOTE — Telephone Encounter (Signed)
 Called and left message last week.  He has appointment today

## 2023-11-06 NOTE — Assessment & Plan Note (Signed)
 Chronic Blood pressure well-controlled denies lightheadedness Continue metoprolol  XL 12.5 mg daily

## 2023-11-06 NOTE — Assessment & Plan Note (Addendum)
 Chronic Has been seen in the past and denied formal testing Memory has worsened and he is having episodes of agitation which is likely related to the dementia in addition to increased anxiety, depression, his wife being on hospice, moving into a facility Start sertraline 25 mg daily for anxiety and depression and mood stabilization Referred back to Dr. Girard think he is open to further evaluation and possible treatment-Will hold off on adding in medication for dementia right now

## 2023-11-06 NOTE — Assessment & Plan Note (Signed)
 New Some of what his family thought were TIA episodes may actually be seizures Neurology in hospital started Keppra 750 mg twice daily Family is concerned if these really were seizures versus TIAs Referral to neurology

## 2023-11-06 NOTE — Assessment & Plan Note (Signed)
 Resolved Workup in hospital unrevealing-neurology consulted and thought to be related to possible seizure activity TIA, stroke ruled out Started on Keppra 73 mg twice daily, B6 50 mg daily for irritability side effect

## 2023-11-06 NOTE — Assessment & Plan Note (Signed)
 Chronic Following with cardiology Rate controlled He is on Eliquis  5 mg twice daily and metoprolol  XL 12.5 mg daily

## 2023-11-06 NOTE — Assessment & Plan Note (Addendum)
 Up acute on chronic Since this hospitalization he has been weaker and his balance has been worse Has orders for PT, OT-has not started yet His daughter will call and try to get this initiated at [facility] He will continue to use his walker-stressed using this continuously to avoid falls

## 2023-11-08 ENCOUNTER — Encounter: Payer: Self-pay | Admitting: Internal Medicine

## 2023-11-08 DIAGNOSIS — Z789 Other specified health status: Secondary | ICD-10-CM | POA: Insufficient documentation

## 2023-11-08 DIAGNOSIS — Z0279 Encounter for issue of other medical certificate: Secondary | ICD-10-CM

## 2023-11-09 ENCOUNTER — Telehealth: Payer: Self-pay

## 2023-11-09 NOTE — Telephone Encounter (Signed)
 Forms completed and original copy left up front for [facility] to pick up form.  Copy also sent via email.

## 2023-11-13 ENCOUNTER — Other Ambulatory Visit: Payer: Self-pay

## 2023-11-13 ENCOUNTER — Emergency Department (HOSPITAL_COMMUNITY)

## 2023-11-13 ENCOUNTER — Inpatient Hospital Stay (HOSPITAL_COMMUNITY)
Admission: EM | Admit: 2023-11-13 | Discharge: 2023-11-17 | DRG: 389 | Disposition: A | Discharge status: Home Health | Source: Skilled Nursing Facility | Attending: Internal Medicine | Admitting: Internal Medicine

## 2023-11-13 ENCOUNTER — Encounter (HOSPITAL_COMMUNITY): Payer: Self-pay

## 2023-11-13 DIAGNOSIS — I700 Atherosclerosis of aorta: Secondary | ICD-10-CM | POA: Diagnosis present

## 2023-11-13 DIAGNOSIS — K5000 Crohn's disease of small intestine without complications: Secondary | ICD-10-CM | POA: Diagnosis present

## 2023-11-13 DIAGNOSIS — R933 Abnormal findings on diagnostic imaging of other parts of digestive tract: Secondary | ICD-10-CM | POA: Diagnosis not present

## 2023-11-13 DIAGNOSIS — N3944 Nocturnal enuresis: Secondary | ICD-10-CM | POA: Diagnosis present

## 2023-11-13 DIAGNOSIS — Z8673 Personal history of transient ischemic attack (TIA), and cerebral infarction without residual deficits: Secondary | ICD-10-CM

## 2023-11-13 DIAGNOSIS — Z809 Family history of malignant neoplasm, unspecified: Secondary | ICD-10-CM

## 2023-11-13 DIAGNOSIS — R1310 Dysphagia, unspecified: Secondary | ICD-10-CM

## 2023-11-13 DIAGNOSIS — H409 Unspecified glaucoma: Secondary | ICD-10-CM | POA: Diagnosis present

## 2023-11-13 DIAGNOSIS — K219 Gastro-esophageal reflux disease without esophagitis: Secondary | ICD-10-CM | POA: Diagnosis present

## 2023-11-13 DIAGNOSIS — I10 Essential (primary) hypertension: Secondary | ICD-10-CM | POA: Diagnosis present

## 2023-11-13 DIAGNOSIS — E785 Hyperlipidemia, unspecified: Secondary | ICD-10-CM | POA: Diagnosis present

## 2023-11-13 DIAGNOSIS — Z8249 Family history of ischemic heart disease and other diseases of the circulatory system: Secondary | ICD-10-CM

## 2023-11-13 DIAGNOSIS — Z832 Family history of diseases of the blood and blood-forming organs and certain disorders involving the immune mechanism: Secondary | ICD-10-CM

## 2023-11-13 DIAGNOSIS — K50019 Crohn's disease of small intestine with unspecified complications: Secondary | ICD-10-CM

## 2023-11-13 DIAGNOSIS — K5641 Fecal impaction: Secondary | ICD-10-CM | POA: Diagnosis not present

## 2023-11-13 DIAGNOSIS — D696 Thrombocytopenia, unspecified: Secondary | ICD-10-CM | POA: Diagnosis present

## 2023-11-13 DIAGNOSIS — Z8616 Personal history of COVID-19: Secondary | ICD-10-CM

## 2023-11-13 DIAGNOSIS — E861 Hypovolemia: Secondary | ICD-10-CM | POA: Diagnosis present

## 2023-11-13 DIAGNOSIS — Z7983 Long term (current) use of bisphosphonates: Secondary | ICD-10-CM

## 2023-11-13 DIAGNOSIS — K529 Noninfective gastroenteritis and colitis, unspecified: Secondary | ICD-10-CM | POA: Diagnosis not present

## 2023-11-13 DIAGNOSIS — F419 Anxiety disorder, unspecified: Secondary | ICD-10-CM | POA: Diagnosis present

## 2023-11-13 DIAGNOSIS — R569 Unspecified convulsions: Secondary | ICD-10-CM

## 2023-11-13 DIAGNOSIS — Z79899 Other long term (current) drug therapy: Secondary | ICD-10-CM

## 2023-11-13 DIAGNOSIS — R197 Diarrhea, unspecified: Secondary | ICD-10-CM

## 2023-11-13 DIAGNOSIS — H9193 Unspecified hearing loss, bilateral: Secondary | ICD-10-CM | POA: Diagnosis present

## 2023-11-13 DIAGNOSIS — J439 Emphysema, unspecified: Secondary | ICD-10-CM | POA: Diagnosis present

## 2023-11-13 DIAGNOSIS — Z1152 Encounter for screening for COVID-19: Secondary | ICD-10-CM

## 2023-11-13 DIAGNOSIS — K222 Esophageal obstruction: Principal | ICD-10-CM | POA: Diagnosis present

## 2023-11-13 DIAGNOSIS — M8580 Other specified disorders of bone density and structure, unspecified site: Secondary | ICD-10-CM | POA: Diagnosis present

## 2023-11-13 DIAGNOSIS — E871 Hypo-osmolality and hyponatremia: Secondary | ICD-10-CM

## 2023-11-13 DIAGNOSIS — Z823 Family history of stroke: Secondary | ICD-10-CM

## 2023-11-13 DIAGNOSIS — Z7901 Long term (current) use of anticoagulants: Secondary | ICD-10-CM

## 2023-11-13 DIAGNOSIS — I480 Paroxysmal atrial fibrillation: Secondary | ICD-10-CM | POA: Diagnosis present

## 2023-11-13 DIAGNOSIS — Z87891 Personal history of nicotine dependence: Secondary | ICD-10-CM

## 2023-11-13 DIAGNOSIS — K317 Polyp of stomach and duodenum: Secondary | ICD-10-CM | POA: Diagnosis present

## 2023-11-13 DIAGNOSIS — K5090 Crohn's disease, unspecified, without complications: Secondary | ICD-10-CM | POA: Diagnosis present

## 2023-11-13 DIAGNOSIS — Z86718 Personal history of other venous thrombosis and embolism: Secondary | ICD-10-CM

## 2023-11-13 DIAGNOSIS — F32A Depression, unspecified: Secondary | ICD-10-CM | POA: Diagnosis present

## 2023-11-13 DIAGNOSIS — K760 Fatty (change of) liver, not elsewhere classified: Secondary | ICD-10-CM | POA: Diagnosis present

## 2023-11-13 DIAGNOSIS — D509 Iron deficiency anemia, unspecified: Secondary | ICD-10-CM | POA: Diagnosis present

## 2023-11-13 DIAGNOSIS — Z860100 Personal history of colon polyps, unspecified: Secondary | ICD-10-CM

## 2023-11-13 DIAGNOSIS — G40909 Epilepsy, unspecified, not intractable, without status epilepticus: Secondary | ICD-10-CM

## 2023-11-13 DIAGNOSIS — I2510 Atherosclerotic heart disease of native coronary artery without angina pectoris: Secondary | ICD-10-CM | POA: Diagnosis present

## 2023-11-13 DIAGNOSIS — Z9049 Acquired absence of other specified parts of digestive tract: Secondary | ICD-10-CM

## 2023-11-13 DIAGNOSIS — E2740 Unspecified adrenocortical insufficiency: Secondary | ICD-10-CM | POA: Diagnosis present

## 2023-11-13 LAB — COMPREHENSIVE METABOLIC PANEL WITH GFR
ALT: 14 U/L (ref 0–44)
AST: 26 U/L (ref 15–41)
Albumin: 3.9 g/dL (ref 3.5–5.0)
Alkaline Phosphatase: 94 U/L (ref 38–126)
Anion gap: 9 (ref 5–15)
BUN: 9 mg/dL (ref 8–23)
CO2: 23 mmol/L (ref 22–32)
Calcium: 8.9 mg/dL (ref 8.9–10.3)
Chloride: 92 mmol/L — ABNORMAL LOW (ref 98–111)
Creatinine, Ser: 0.81 mg/dL (ref 0.61–1.24)
GFR, Estimated: >60 mL/min (ref >60)
Glucose, Bld: 100 mg/dL — ABNORMAL HIGH (ref 70–99)
Potassium: 3.6 mmol/L (ref 3.5–5.1)
Sodium: 124 mmol/L — ABNORMAL LOW (ref 135–145)
Total Bilirubin: 1 mg/dL (ref 0.0–1.2)
Total Protein: 6.7 g/dL (ref 6.5–8.1)

## 2023-11-13 LAB — CBC WITH DIFFERENTIAL/PLATELET
Abs Immature Granulocytes: 0.18 K/uL — ABNORMAL HIGH (ref 0.00–0.07)
Basophils Absolute: 0.1 K/uL (ref 0.0–0.1)
Basophils Relative: 1 %
Eosinophils Absolute: 0.1 K/uL (ref 0.0–0.5)
Eosinophils Relative: 2 %
HCT: 30.2 % — ABNORMAL LOW (ref 39.0–52.0)
Hemoglobin: 10 g/dL — ABNORMAL LOW (ref 13.0–17.0)
Immature Granulocytes: 3 %
Lymphocytes Relative: 29 %
Lymphs Abs: 2.1 K/uL (ref 0.7–4.0)
MCH: 28.1 pg (ref 26.0–34.0)
MCHC: 33.1 g/dL (ref 30.0–36.0)
MCV: 84.8 fL (ref 80.0–100.0)
Monocytes Absolute: 0.7 K/uL (ref 0.1–1.0)
Monocytes Relative: 10 %
Neutro Abs: 3.9 K/uL (ref 1.7–7.7)
Neutrophils Relative %: 55 %
Platelets: 149 K/uL — ABNORMAL LOW (ref 150–400)
RBC: 3.56 MIL/uL — ABNORMAL LOW (ref 4.22–5.81)
RDW: 18.9 % — ABNORMAL HIGH (ref 11.5–15.5)
WBC: 7.1 K/uL (ref 4.0–10.5)
nRBC: 0 % (ref 0.0–0.2)

## 2023-11-13 LAB — I-STAT CHEM 8, ED
BUN: 8 mg/dL (ref 8–23)
Calcium, Ion: 1.19 mmol/L (ref 1.15–1.40)
Chloride: 92 mmol/L — ABNORMAL LOW (ref 98–111)
Creatinine, Ser: 1 mg/dL (ref 0.61–1.24)
Glucose, Bld: 100 mg/dL — ABNORMAL HIGH (ref 70–99)
HCT: 29 % — ABNORMAL LOW (ref 39.0–52.0)
Hemoglobin: 9.9 g/dL — ABNORMAL LOW (ref 13.0–17.0)
Potassium: 3.8 mmol/L (ref 3.5–5.1)
Sodium: 126 mmol/L — ABNORMAL LOW (ref 135–145)
TCO2: 21 mmol/L — ABNORMAL LOW (ref 22–32)

## 2023-11-13 LAB — URINALYSIS, ROUTINE W REFLEX MICROSCOPIC
Bilirubin Urine: NEGATIVE
Glucose, UA: NEGATIVE mg/dL
Hgb urine dipstick: NEGATIVE
Ketones, ur: NEGATIVE mg/dL
Leukocytes,Ua: NEGATIVE
Nitrite: NEGATIVE
Protein, ur: NEGATIVE mg/dL
Specific Gravity, Urine: 1.011 (ref 1.005–1.030)
pH: 5 (ref 5.0–8.0)

## 2023-11-13 LAB — MAGNESIUM: Magnesium: 1.7 mg/dL (ref 1.7–2.4)

## 2023-11-13 LAB — SARS CORONAVIRUS 2 BY RT PCR: SARS Coronavirus 2 by RT PCR: NEGATIVE

## 2023-11-13 LAB — PHOSPHORUS: Phosphorus: 2.4 mg/dL — ABNORMAL LOW (ref 2.5–4.6)

## 2023-11-13 MED ORDER — METOPROLOL SUCCINATE ER 25 MG PO TB24
12.5000 mg | ORAL_TABLET | Freq: Every day | ORAL | Status: DC
  Administered 2023-11-14 – 2023-11-17 (×4): 12.5 mg via ORAL
  Filled 2023-11-13 (×4): qty 1

## 2023-11-13 MED ORDER — HYDROXYZINE HCL 10 MG PO TABS
10.0000 mg | ORAL_TABLET | Freq: Once | ORAL | Status: AC | PRN
  Administered 2023-11-13: 10 mg via ORAL
  Filled 2023-11-13: qty 1

## 2023-11-13 MED ORDER — METRONIDAZOLE 500 MG/100ML IV SOLN
500.0000 mg | Freq: Once | INTRAVENOUS | Status: AC
  Administered 2023-11-13: 500 mg via INTRAVENOUS
  Filled 2023-11-13: qty 100

## 2023-11-13 MED ORDER — TRIAMCINOLONE ACETONIDE 0.5 % EX CREA
TOPICAL_CREAM | Freq: Two times a day (BID) | CUTANEOUS | Status: AC
  Filled 2023-11-13: qty 15

## 2023-11-13 MED ORDER — ORAL CARE MOUTH RINSE: 15.0000 mL | OROMUCOSAL | Status: DC | PRN

## 2023-11-13 MED ORDER — SODIUM CHLORIDE 0.9 % IV BOLUS
1000.0000 mL | Freq: Once | INTRAVENOUS | Status: AC
  Administered 2023-11-13: 1000 mL via INTRAVENOUS

## 2023-11-13 MED ORDER — SODIUM CHLORIDE 0.9 % IV SOLN: 2.0000 g | INTRAVENOUS | Status: DC

## 2023-11-13 MED ORDER — SERTRALINE HCL 25 MG PO TABS
25.0000 mg | ORAL_TABLET | Freq: Every day | ORAL | Status: DC
  Administered 2023-11-14 – 2023-11-17 (×4): 25 mg via ORAL
  Filled 2023-11-13 (×4): qty 1

## 2023-11-13 MED ORDER — ONDANSETRON HCL 4 MG PO TABS: 4.0000 mg | ORAL_TABLET | Freq: Four times a day (QID) | ORAL | Status: DC | PRN

## 2023-11-13 MED ORDER — LATANOPROST 0.005 % OP SOLN
1.0000 [drp] | Freq: Every day | OPHTHALMIC | Status: DC
Start: 2023-11-13 — End: 2023-11-17
  Administered 2023-11-13 – 2023-11-16 (×4): 1 [drp] via OPHTHALMIC
  Filled 2023-11-13: qty 2.5

## 2023-11-13 MED ORDER — ACETAMINOPHEN 325 MG PO TABS
650.0000 mg | ORAL_TABLET | Freq: Four times a day (QID) | ORAL | Status: DC | PRN
  Administered 2023-11-13 – 2023-11-15 (×2): 650 mg via ORAL
  Filled 2023-11-13 (×2): qty 2

## 2023-11-13 MED ORDER — PANTOPRAZOLE SODIUM 40 MG IV SOLR
40.0000 mg | Freq: Once | INTRAVENOUS | Status: AC
  Administered 2023-11-13: 40 mg via INTRAVENOUS
  Filled 2023-11-13: qty 10

## 2023-11-13 MED ORDER — LEVETIRACETAM 500 MG PO TABS
750.0000 mg | ORAL_TABLET | Freq: Two times a day (BID) | ORAL | Status: DC
  Administered 2023-11-13 – 2023-11-17 (×8): 750 mg via ORAL
  Filled 2023-11-13 (×8): qty 1

## 2023-11-13 MED ORDER — ONDANSETRON HCL 4 MG/2ML IJ SOLN: 4.0000 mg | Freq: Four times a day (QID) | INTRAMUSCULAR | Status: DC | PRN

## 2023-11-13 MED ORDER — SODIUM CHLORIDE 0.9 % IV SOLN: INTRAVENOUS | Status: AC

## 2023-11-13 MED ORDER — SMOG ENEMA
960.0000 mL | Freq: Once | RECTAL | Status: DC
  Administered 2023-11-13: 960 mL via RECTAL
  Filled 2023-11-13 (×2): qty 960

## 2023-11-13 MED ORDER — DESMOPRESSIN ACETATE 0.1 MG PO TABS
0.1000 mg | ORAL_TABLET | Freq: Every day | ORAL | Status: DC
  Administered 2023-11-13: 0.1 mg via ORAL
  Filled 2023-11-13 (×2): qty 1

## 2023-11-13 MED ORDER — ONDANSETRON HCL 4 MG/2ML IJ SOLN
4.0000 mg | Freq: Once | INTRAMUSCULAR | Status: AC
  Administered 2023-11-13: 4 mg via INTRAVENOUS
  Filled 2023-11-13: qty 2

## 2023-11-13 MED ORDER — LACTATED RINGERS IV BOLUS
500.0000 mL | Freq: Once | INTRAVENOUS | Status: AC
  Administered 2023-11-13: 500 mL via INTRAVENOUS

## 2023-11-13 MED ORDER — SODIUM CHLORIDE 0.9 % IV SOLN
1.0000 g | Freq: Once | INTRAVENOUS | Status: AC
  Administered 2023-11-13: 1 g via INTRAVENOUS
  Filled 2023-11-13: qty 10

## 2023-11-13 MED ORDER — IOHEXOL 300 MG/ML  SOLN
100.0000 mL | Freq: Once | INTRAMUSCULAR | Status: AC | PRN
  Administered 2023-11-13: 100 mL via INTRAVENOUS

## 2023-11-13 MED ORDER — DORZOLAMIDE HCL-TIMOLOL MAL 2-0.5 % OP SOLN
1.0000 [drp] | Freq: Two times a day (BID) | OPHTHALMIC | Status: DC
  Administered 2023-11-13 – 2023-11-17 (×8): 1 [drp] via OPHTHALMIC
  Filled 2023-11-13: qty 10

## 2023-11-13 MED ORDER — NETARSUDIL DIMESYLATE 0.02 % OP SOLN
1.0000 [drp] | Freq: Every evening | OPHTHALMIC | Status: DC
  Administered 2023-11-13 – 2023-11-16 (×4): 1 [drp] via OPHTHALMIC

## 2023-11-13 MED ORDER — METRONIDAZOLE 500 MG/100ML IV SOLN
500.0000 mg | Freq: Two times a day (BID) | INTRAVENOUS | Status: DC
Start: 2023-11-13 — End: 2023-11-13

## 2023-11-13 MED ORDER — ACETAMINOPHEN 650 MG RE SUPP: 650.0000 mg | Freq: Four times a day (QID) | RECTAL | Status: DC | PRN

## 2023-11-13 NOTE — ED Provider Notes (Signed)
 [place] EMERGENCY DEPARTMENT AT [facility] Provider Note   CSN: [id number] Arrival date & time: 11/13/23  9078     Patient presents with: Abdominal Pain and Emesis   Michael [name] is a 89 y.o. male.   HPI 89 yo male ho Crohns dz presents today with diarrhea for 3 days q 2-3 hours, non bloody, no definite fever or chills, complaining of weakness. No definite fever chills.  Some urinary frequency.  Taking po well until this am.  This am he felt like he got something stuck in throat and has been coughing up yellowish fluid.     Prior to Admission medications   Medication Sig Start Date End Date Taking? Authorizing Provider  acetaminophen  (TYLENOL ) 500 MG tablet Take 500-1,000 mg by mouth every 8 (eight) hours as needed for mild pain or headache.     [provider]  alendronate  (FOSAMAX ) 70 MG tablet Take 1 tablet (70 mg total) by mouth every 7 (seven) days. Take with a full glass of water  on an empty stomach. 03/20/23   [name], MD  apixaban  (ELIQUIS ) 5 MG TABS tablet Take 1 tablet (5 mg total) by mouth 2 (two) times daily. 09/06/21   Verlin Lonni BIRCH, MD  Ascorbic Acid  (VITAMIN C ) 1000 MG tablet Take 1,000 mg by mouth daily with breakfast.     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  10/03/17   [provider]  B-D 3CC LUER-LOK SYR 25GX1 25G X 1 3 ML MISC USE MONTHLY FOR B-12 INJECTIONS 01/13/23   [name], MD  Cholecalciferol  (VITAMIN D3) 50 MCG (2000 UT) TABS Take 2,000 Units by mouth daily with breakfast.    [provider]  cyanocobalamin  (VITAMIN B12) 1000 MCG/ML injection INJECT 1ML INTO THE MUSCLE ONCE MONTHLY 09/27/23   [name], MD  desmopressin  (DDAVP ) 0.1 MG tablet Take 100 mcg by mouth daily. 04/30/22   [provider]  dorzolamide -timolol  (COSOPT ) 22.3-6.8 MG/ML ophthalmic  solution Place 1 drop into the left eye 2 (two) times daily. 11/05/15   [provider]  latanoprost  (XALATAN ) 0.005 % ophthalmic solution Place 1 drop into the left eye at bedtime. 09/12/17   [provider]  levETIRAcetam (KEPPRA) 750 MG tablet Take 1 tablet (750 mg total) by mouth 2 (two) times daily. 10/30/23   Gonfa, Taye T, MD  loperamide  (IMODIUM  A-D) 2 MG tablet Take 2 mg by mouth daily as needed for diarrhea or loose stools.    [provider]  metoprolol  succinate (TOPROL -XL) 25 MG 24 hr tablet Take 0.5 tablets (12.5 mg total) by mouth daily. 11/06/23   [name], MD  Netarsudil  Dimesylate 0.02 % SOLN Place 1 drop into the left eye every evening.    [provider]  omeprazole  (PRILOSEC  OTC) 20 MG tablet Take 20 mg by mouth as needed.    [provider]  pyridOXINE (B-6) 50 MG tablet Take 1 tablet (50 mg total) by mouth daily. 10/31/23   Gonfa, Taye T, MD  sertraline (ZOLOFT) 25 MG tablet Take 1 tablet (25 mg total) by mouth daily. 11/06/23   [name], MD  tamsulosin  (FLOMAX ) 0.4 MG CAPS capsule Take 0.4 mg by mouth at bedtime. 12/20/19   [provider]    Allergies: Patient has no known allergies.    Review of Systems  Updated Vital Signs BP ROLLEN)  146/85   Pulse 74   Temp 97.9 F (36.6 C) (Oral)   Resp 19   SpO2 100%   Physical Exam Vitals reviewed.  HENT:     Head: Normocephalic.     Mouth/Throat:     Mouth: Mucous membranes are moist.  Eyes:     Extraocular Movements: Extraocular movements intact.  Pulmonary:     Effort: Pulmonary effort is normal.  Abdominal:     General: Bowel sounds are normal.     Palpations: Abdomen is soft.     Tenderness: There is generalized abdominal tenderness. There is no rebound.     Hernia: No hernia is present.  Neurological:     Mental Status: He is alert.     (all labs ordered are listed, but only abnormal results are displayed) Labs Reviewed  CBC WITH  DIFFERENTIAL/PLATELET - Abnormal; Notable for the following components:      Result Value   RBC 3.56 (*)    Hemoglobin 10.0 (*)    HCT 30.2 (*)    RDW 18.9 (*)    Platelets 149 (*)    Abs Immature Granulocytes 0.18 (*)    All other components within normal limits  COMPREHENSIVE METABOLIC PANEL WITH GFR - Abnormal; Notable for the following components:   Sodium 124 (*)    Chloride 92 (*)    Glucose, Bld 100 (*)    All other components within normal limits  I-STAT CHEM 8, ED - Abnormal; Notable for the following components:   Sodium 126 (*)    Chloride 92 (*)    Glucose, Bld 100 (*)    TCO2 21 (*)    Hemoglobin 9.9 (*)    HCT 29.0 (*)    All other components within normal limits  SARS CORONAVIRUS 2 BY RT PCR  C DIFFICILE QUICK SCREEN W PCR REFLEX    URINALYSIS, ROUTINE W REFLEX MICROSCOPIC    EKG: None  Radiology: CT CHEST ABDOMEN PELVIS W CONTRAST Result Date: 11/13/2023 CLINICAL DATA:  Abdominal pain, nausea, vomiting, diarrhea for 3 days, patient feels like eggs stuck in throat EXAM: CT CHEST, ABDOMEN, AND PELVIS WITH CONTRAST TECHNIQUE: Multidetector CT imaging of the chest, abdomen and pelvis was performed following the standard protocol during bolus administration of intravenous contrast. RADIATION DOSE REDUCTION: This exam was performed according to the departmental dose-optimization program which includes automated exposure control, adjustment of the mA and/or kV according to patient size and/or use of iterative reconstruction technique. CONTRAST:  OMNIPAQUE  IOHEXOL  300 MG/ML  SOLN COMPARISON:  None Available. FINDINGS: CT CHEST FINDINGS Cardiovascular: Scattered aortic atherosclerosis. Normal heart size. Left coronary artery calcifications. No pericardial effusion. Mediastinum/Nodes: No enlarged mediastinal, hilar, or axillary lymph nodes. Esophagus is fluid and debris-filled to the level of the thoracic inlet without visible mass or obstruction distally. Thyroid  gland  and trachea demonstrate no significant findings. Lungs/Pleura: Mild centrilobular and paraseptal emphysema. Mild pulmonary fibrosis with a slight apical to basal gradient, featuring irregular ground-glass and nodularity in the upper lobes (series 6, image 45), with some evidence of subpleural sparing, particularly in the right upper lobe, with dependent bibasilar subpleural bronchiolectasis. No pleural effusion or pneumothorax. Musculoskeletal: No chest wall abnormality. No acute osseous findings. CT ABDOMEN PELVIS FINDINGS Hepatobiliary: No solid liver abnormality is seen. Distended gallbladder. No gallstones, gallbladder wall thickening, or biliary dilatation. Pancreas: Unremarkable. No pancreatic ductal dilatation or surrounding inflammatory changes. Spleen: Normal in size without significant abnormality. Adrenals/Urinary Tract: Adrenal glands are unremarkable. Kidneys are normal, without renal  calculi, solid lesion, or hydronephrosis. Bladder is unremarkable. Stomach/Bowel: Stomach is within normal limits. Status post terminal ileocolectomy and ileocolic reanastomosis. Long segment wall thickening, mucosal hyperenhancement, and narrowing of the tethered appearing remnant distal ileum, affecting a segment at least 40 cm in length (series 8, image 86). Additional circumferential wall thickening of the low rectum (series 2, image 128). Moderate burden of stool balls in the distal colon and rectum, with fluid in the proximal colon. Vascular/Lymphatic: Aortic atherosclerosis. No enlarged abdominal or pelvic lymph nodes. Reproductive: Prostatomegaly with median lobe hypertrophy. Other: No abdominal wall hernia or abnormality. No ascites. Musculoskeletal: No acute osseous findings. IMPRESSION: 1. Status post terminal ileocolectomy and ileocolic reanastomosis. Long segment wall thickening, mucosal hyperenhancement, and narrowing of the tethered appearing remnant distal ileum, affecting a segment at least 40 cm in  length. Findings are consistent with active inflammatory bowel disease. No evidence of obstruction. 2. Additional circumferential wall thickening of the low rectum, consistent with proctitis. 3. Esophagus is fluid and debris-filled to the level of the thoracic inlet without visible mass or obstruction distally. Consider endoscopy to further evaluate for stricture, achalasia, or occult mass. 4. Mild pulmonary fibrosis with a slight apical to basal gradient, featuring irregular ground-glass and nodularity in the upper lobes, with some evidence of subpleural sparing, particularly in the right upper lobe, with dependent bibasilar subpleural bronchiolectasis. 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 5. Emphysema. 6. Coronary artery disease. Aortic Atherosclerosis (ICD10-I70.0) and Emphysema (ICD10-J43.9). Electronically Signed   By: Marolyn JONETTA Jaksch M.D.   On: 11/13/2023 11:50     .Critical Care  Performed by: Levander Houston, MD Authorized by: Levander Houston, MD   Critical care provider statement:    Critical care time (minutes):  45   Critical care time was exclusive of:  Separately billable procedures and treating other patients and teaching time   Critical care was time spent personally by me on the following activities:  Development of treatment plan with patient or surrogate, discussions with consultants, evaluation of patient's response to treatment, examination of patient, ordering and review of laboratory studies, ordering and review of radiographic studies, ordering and performing treatments and interventions, pulse oximetry, re-evaluation of patient's condition and review of old charts    Medications Ordered in the ED  cefTRIAXone  (ROCEPHIN ) 1 g in sodium chloride  0.9 % 100 mL IVPB (has no administration in time range)  metroNIDAZOLE  (FLAGYL ) IVPB 500 mg (has no administration in time range)  lactated ringers  bolus 500 mL (0 mLs Intravenous Stopped 11/13/23 1141)  ondansetron   (ZOFRAN ) injection 4 mg (4 mg Intravenous Given 11/13/23 1045)  iohexol  (OMNIPAQUE ) 300 MG/ML solution 100 mL (100 mLs Intravenous Contrast Given 11/13/23 1058)    Clinical Course as of 11/13/23 1351  Mon Nov 13, 2023  1306 [facility] reviewed interpreted significant for anemia hemoglobin at 10 which appears well from prior [DR]  1306 Patient with mild thrombocytopenia appears stable from prior  [DR]  1306 Complete metabolic panel is reviewed and significant for hyponatremia sodium of 124 chloride 92 otherwise within normal limits  [DR]  1306 Urinalysis obtained and is dipstick negative C. difficile is pending COVID and flu are pending  [DR]  1307 CT chest abdomen and pelvis obtained.  There is long segment of wall thickening with mucosal hyperenhancement and narrowing of the distal ileum affecting at least a 40 cm length.  These are consistent with active inflammatory bowel disease and additionally has circumferential wall thickening of the lower rectum Additionally the esophagus  is fluid and debris-filled to the level of the thoracic inlet doubt visible mass or obstruction [DR]    Clinical Course User Index [DR] Levander Houston, MD                                 Medical Decision Making Amount and/or Complexity of Data Reviewed Labs: ordered. Radiology: ordered.  Risk Prescription drug management.   89 year old male history of Crohn's colitis presents today with abdominal pain and diarrhea.  Chief complaint is diarrhea for several days.  No blood was noted.  No fever or chills are noted.  Patient has history of Crohn's colitis.  Patient is unable to give additional history. Today has had increasing weakness. Today he also was unable to swallow anything down at while he was eating breakfast felt like something was stuck in his throat. Patient was evaluated here with physical exam with some tenderness of his abdomen Patient evaluated here with labs Patient evaluated with CT scan please see  findings in workup tab 1 colitis may be infectious or may be secondary to autoimmune.  Patient is being treated with Rocephin  and Flagyl  2 hyponatremia sodium decreased to 124 3 patient with esophageal obstruction-patient is followed by [facility].  They have been paged  Hospitalist have been paged for admission Discussed with our GI, Amy Esterwood, they will see in consultation and do not advise steroids at this time.  They do advise keeping n.p.o. as they may be taking too endoscopy today Care discussed with Dr. Celinda, on-call for hospitalist who accepts for admission    Final diagnoses:  Enteritis  Diarrhea, unspecified type  Esophageal obstruction  Hyponatremia    ED Discharge Orders     None          Levander Houston, MD 11/13/23 1351

## 2023-11-13 NOTE — ED Triage Notes (Addendum)
 PT arrives via POV. PT c/o abdominal pain, nausea, vomiting, and diarrhea for the past 3 days. PT states he also thinks he has eggs stuck in his throat. Airway is patent.

## 2023-11-13 NOTE — Consult Note (Addendum)
 Referring Provider: Dr. Edsel Dade Primary Care Physician:  [name], MD Primary Gastroenterologist:  Dr. Elspeth Naval   Reason for Consultation:  Diarrhea and Crohn's disease   HPI: Michael [name] is a 89 y.o. male with a past medical history of glaucoma, atrial fibrillation on Eliquis , hepatic steatosis, colon polyps and Crohn's disease initially diagnosed in 1966 with fibrostenosing ileal Crohn's disease s/p surgery in 1968 and 2001 for small bowel resections. Previously treated with chronic Prednisone  and subsequently developed adrenal insufficiency and avascular necrosis of the hips.  He was last seen in our outpatient GI clinic by Dr. Naval 11/16/2022 and no further colonoscopies were recommended due to age and intermittent treatment with Budesonide  PRN  for Crohn's flares was recommended, patient previously declined biologic therapy.   Patient was recently admitted 10/18 - 10/30/2023 with acute change in mental status with aphasia.  Code stroke was initially activated then canceled per neurology. CT head, CT angio head and neck and MRI brain without acute finding.  He was loaded with Keppra.  EEG was normal.  He had recurrent episodes concerning for seizure activity which stabilized.  He was discharged home on Keppra 7050 mg twice daily, vitamin B 6 and to follow-up with neurology as an outpatient.  He developed diarrhea which progressively worsened over the past 3 days with associated generalized weakness. He also felt like he had eggs stuck in his throat after breakfast this morning. He presented to [facility] ED this morning for further evaluation.  He denied having any nausea, vomiting or abdominal pain. Labs in the ED showed a WBC count of 7.1.  Hemoglobin 10.8 ( Hg 10.8 one week ago).  Repeat Hg 9.9. Hematocrit 30.2.  Platelets 149.  Sodium 124.  Potassium 3.6.  Glucose 100.  Normal LFTs.  SARS coronavirus 2 negative.  Normal urinalysis.  Chest/abdominal/pelvic CT  with contrast showed evidence of past terminal ileocolectomy and ileocolic reanastomosis, long segment wall thickening, mucosal hyperenhancement, and narrowing of the tethered appearing remnant distal ileum, affecting a segment at least 40 cm in length consistent with active inflammatory bowel disease, no evidence of obstruction and circumferential wall thickening of the low rectum, consistent with proctitis.The esophagus is fluid and debris-filled to the level of the thoracic inlet without visible mass or obstruction distally.  He denies having any N/V or abdominal pain. He stated passing nonbloody watery loose stools 3 to 4 times daily for the past 3 to 4 days. He stated he typically passes loose stools which is his chronic bowel pattern with history of Crohn's and prior SBO resections. He last took a course of Budesonide  for Crohn's related flare  about 6 weeks ago for 30 days, finished 2 weeks ago. He believes he has taken Imodium  once daily, sometimes twice daily for the past 6 months. He has increased heartburn for the past few weeks for which he takes TUMS as needed.  He denies taking Prilosec /Omeprazole  as listed on his home medication list. He believes his heartburn was triggered by the food prepared at his retirement facility. He has infrequent dysphagia, food rarely gets stuck briefly in his esophagus. He cannot recall the last time he took an antibiotic.  He has not history of atrial fibrillation on Eliquis , last dose was today at 8 AM.  ECHO 09/23/2020: EF 55-60%, grade I DD   GI PROCEDURES:  Colonoscopy 12/2015 - [name] - normal colon, patent ileocolonic anastomosis, active Crohn's ileitis   Colonoscopy 01/24/2019  - Preparation of the  colon was fair. - Crohn's disease with mild ileitis. - Patent end-to-side ileo-colonic anastomosis, characterized by mild stenosis. Biopsied. - Abnormal mucosa in the ascending colon. Biopsied. - Two 3 to 6 mm polyps in the ascending colon, removed with  a cold snare. Resected and retrieved. - Eight 4 to 12 mm polyps in the transverse colon, removed with a cold snare. Resected and retrieved. - One large polyp in the transverse colon. Tattooed. Not removed as outlined above - One 10 mm polyp at the splenic flexure, removed with a cold snare. Resected and retrieved. - One 10 mm polyp in the descending colon, removed with a cold snare. Resected and retrieved. - Five 5 to 10 mm polyps in the sigmoid colon, removed with a cold snare. Resected and retrieved. - Two 4 mm polyps polyps at the recto-sigmoid colon, removed with a cold snare. Resected and retrieved. - Diverticulosis in the entire examined colon. - Stool in the entire examined colon. - Colonic spasm. - The examination was otherwise normal.   Most polyps adenomatous Biopsies of right sided abnormality adenomatous   Colonoscopy 03/25/19  - Preparation of the colon was fair with significant spasm which prolonged this exam - Active ileal Crohn's disease as described - [name] i2. - Patent end-to-side ileo-colonic anastomosis, characterized by inflammation. - Numerous flat polyps noted in the ascending colon and proximal transverse colon as noted. Several minutes spent lavaging the colon and methylene blue  used to evaluate the right colon to better delineate some of these especially the largest lesion which may extend into the surgical anastomosis. Largest transverse polyp removed as outlined, with another large transverse polyp noted today which was not removed given duration of procedure and colonic spasm.   FINAL MICROSCOPIC DIAGNOSIS:   A. COLON, RIGHT, POLYPECTOMY:  - Tubular adenoma (x2 fragments).  - No high grade dysplasia or malignancy.   B. COLON, RIGHT NEAR ANASTAMOSIS, POLYPECTOMY:  - Tubular adenoma (multiple fragments).  - No high grade dysplasia or malignancy.   C. COLON, PROXIMAL TRANSVERSE, POLYPECTOMY:  - Tubular adenoma (multiple fragments).  - No high  grade dysplasia or malignancy.   D. COLON, TRANSVERSE, LARGE, POLYPECTOMY:  - Tubular adenoma (multiple fragments).  - No high grade dysplasia or malignancy.     Colonoscopy 08/26/19 - Crohn's disease with mildly active ileitis. - Patent end-to-side ileo-colonic anastomosis. - Prior polypectomy site in the ascending colon, unclear if residual polyp vs. normal variant ileal tissue given location at the anatomosis. Area removed piecemeal using a cold snare. Resected and retrieved. - Chromoendoscopy applied to the proximal transverse and right colon. - Two 5 to 20 mm polyps in the transverse colon, removed piecemeal using a cold snare. Resected and retrieved. - Diverticulosis in the transverse colon and in the left colon. - The examination was otherwise normal. - Spatic colon treated with glucagon . Overall, very challenging exam, time needed to clear the colon, visualization difficult given spasm. I think the colon at this point has been cleared of all high risk lesions however difficult to say if there is residual / recurrence at the polyp site near the anastomosis. Under normal circumstances surgery would be recommended to remove right colon and anastomosis however given patient's age he has elected for colonoscopy surveillance.    FINAL MICROSCOPIC DIAGNOSIS:   A. COLON, RIGHT, SNARE POLYPECTOMY:  - Tubular adenoma(s)  - Negative for high-grade dysplasia or malignancy   B. COLON, TRANSVERSE, SNARE POLYPECTOMY:  - Tubular adenoma(s)  - Negative for high-grade dysplasia or malignancy  C. COLON, TRANSVERSE, SNARE POLYPECTOMY:  - Tubular adenoma(s)  - Negative for high-grade dysplasia or malignancy    Colonoscopy 03/19/20 -  Crohn's disease with mildly active ileitis. No overt polypoid tissue at the entrance of the ileum but multiple biopsies taken. - Patent end-to-end ileo-colonic anastomosis. - ? polypoid lesion in the ascending colon just distal to the anastomosis, in  close approximation, vs. scar tissue or normal variant as above, very difficult to see clear borders. Area was removed with cold snare, - Diverticulosis in the transverse colon and in the left colon. - The examination was otherwise normal. No high risk lesions otherwise appreciated.   FINAL MICROSCOPIC DIAGNOSIS:   A. COLON, ASCENDING, POLYPECTOMY:  - Fragments of polypoid colonic mucosa showing low-grade dysplasia with  a tubular architecture.  See comment  - Inflammatory polyp  - Other fragments of polypoid colonic mucosa with no specific  histopathologic changes   B. SURGICAL ANASTAMOSIS, BIOPSY:  - Colonic mucosa with nonspecific inflammatory and architectural  changes, consistent with anastomotic site  - Negative for granulomas or dysplasia    Past Medical History:  Diagnosis Date   Adrenal insufficiency    Anemia    Atrial fibrillation (HCC)    Avascular necrosis (HCC)    Clavicle fracture 11/02/2017   Colon polyp    Colon polyps    Crohn's disease (HCC)    Glaucoma    Inguinal hernia recurrent bilateral    Low testosterone     Osteopenia    Retinal ischemia     Past Surgical History:  Procedure Laterality Date   ABDOMINAL SURGERY     BIOPSY  03/19/2020   Procedure: BIOPSY;  Surgeon: Leigh Elspeth SQUIBB, MD;  Location: [facility];  Service: Gastroenterology;;   COLONOSCOPY N/A 12/13/2015   Procedure: COLONOSCOPY;  Surgeon: Lynwood Bohr, MD;  Location: [facility];  Service: Endoscopy;  Laterality: N/A;   COLONOSCOPY WITH PROPOFOL  N/A 03/25/2019   Procedure: COLONOSCOPY WITH PROPOFOL ;  Surgeon: Leigh Elspeth SQUIBB, MD;  Location: [facility];  Service: Gastroenterology;  Laterality: N/A;   COLONOSCOPY WITH PROPOFOL  N/A 08/26/2019   Procedure: COLONOSCOPY WITH PROPOFOL ;  Surgeon: Leigh Elspeth SQUIBB, MD;  Location: [facility];  Service: Gastroenterology;  Laterality: N/A;   COLONOSCOPY WITH PROPOFOL  N/A 03/19/2020   Procedure: COLONOSCOPY WITH PROPOFOL ;   Surgeon: Leigh Elspeth SQUIBB, MD;  Location: [facility];  Service: Gastroenterology;  Laterality: N/A;   ENDOSCOPIC MUCOSAL RESECTION N/A 08/26/2019   Procedure: ENDOSCOPIC MUCOSAL RESECTION;  Surgeon: Leigh Elspeth SQUIBB, MD;  Location: [facility];  Service: Gastroenterology;  Laterality: N/A;   INGUINAL HERNIA REPAIR Bilateral 05/21/2014   Procedure: OPEN BILATERAL INGUINAL HERNIA REPAIRS WITH MESH;  Surgeon: Donnice Lima, MD;  Location: [facility];  Service: General;  Laterality: Bilateral;   POLYPECTOMY  03/25/2019   Procedure: POLYPECTOMY;  Surgeon: Leigh Elspeth SQUIBB, MD;  Location: [facility];  Service: Gastroenterology;;   POLYPECTOMY  08/26/2019   Procedure: POLYPECTOMY;  Surgeon: Leigh Elspeth SQUIBB, MD;  Location: [facility];  Service: Gastroenterology;;   POLYPECTOMY  03/19/2020   Procedure: POLYPECTOMY;  Surgeon: Leigh Elspeth SQUIBB, MD;  Location: [facility];  Service: Gastroenterology;;   WALDEMAR  08/26/2019   Procedure: Sprayed methylene blue ;  Surgeon: Leigh Elspeth SQUIBB, MD;  Location: [facility];  Service: Gastroenterology;;   SMALL INTESTINE SURGERY  1968, 2001    related to Chrohn's disease   TONSILLECTOMY      Prior to Admission medications   Medication Sig Start Date End Date Taking? Authorizing  Provider  acetaminophen  (TYLENOL ) 500 MG tablet Take 500-1,000 mg by mouth every 8 (eight) hours as needed for mild pain or headache.     [provider]  alendronate  (FOSAMAX ) 70 MG tablet Take 1 tablet (70 mg total) by mouth every 7 (seven) days. Take with a full glass of water  on an empty stomach. 03/20/23   [name], MD  apixaban  (ELIQUIS ) 5 MG TABS tablet Take 1 tablet (5 mg total) by mouth 2 (two) times daily. 09/06/21   Verlin Lonni BIRCH, MD  Ascorbic Acid  (VITAMIN C ) 1000 MG tablet Take 1,000 mg by mouth daily with breakfast.     [provider]  augmented betamethasone dipropionate (DIPROLENE-AF) 0.05 %  cream Apply 1 application topically 2 (two) times daily as needed (Grover's disease).  10/03/17   [provider]  B-D 3CC LUER-LOK SYR 25GX1 25G X 1 3 ML MISC USE MONTHLY FOR B-12 INJECTIONS 01/13/23   [name], MD  Cholecalciferol  (VITAMIN D3) 50 MCG (2000 UT) TABS Take 2,000 Units by mouth daily with breakfast.    [provider]  cyanocobalamin  (VITAMIN B12) 1000 MCG/ML injection INJECT 1ML INTO THE MUSCLE ONCE MONTHLY 09/27/23   [name], MD  desmopressin  (DDAVP ) 0.1 MG tablet Take 100 mcg by mouth daily. 04/30/22   [provider]  dorzolamide -timolol  (COSOPT ) 22.3-6.8 MG/ML ophthalmic solution Place 1 drop into the left eye 2 (two) times daily. 11/05/15   [provider]  latanoprost  (XALATAN ) 0.005 % ophthalmic solution Place 1 drop into the left eye at bedtime. 09/12/17   [provider]  levETIRAcetam (KEPPRA) 750 MG tablet Take 1 tablet (750 mg total) by mouth 2 (two) times daily. 10/30/23   Gonfa, Taye T, MD  loperamide  (IMODIUM  A-D) 2 MG tablet Take 2 mg by mouth daily as needed for diarrhea or loose stools.    [provider]  metoprolol  succinate (TOPROL -XL) 25 MG 24 hr tablet Take 0.5 tablets (12.5 mg total) by mouth daily. 11/06/23   [name], MD  Netarsudil  Dimesylate 0.02 % SOLN Place 1 drop into the left eye every evening.    [provider]  omeprazole  (PRILOSEC  OTC) 20 MG tablet Take 20 mg by mouth as needed.    [provider]  pyridOXINE (B-6) 50 MG tablet Take 1 tablet (50 mg total) by mouth daily. 10/31/23   Gonfa, Taye T, MD  sertraline (ZOLOFT) 25 MG tablet Take 1 tablet (25 mg total) by mouth daily. 11/06/23   [name], MD  tamsulosin  (FLOMAX ) 0.4 MG CAPS capsule Take 0.4 mg by mouth at bedtime. 12/20/19   [provider]    Current Facility-Administered Medications  Medication Dose Route Frequency Provider Last Rate Last Admin   cefTRIAXone  (ROCEPHIN ) 1 g in sodium  chloride 0.9 % 100 mL IVPB  1 g Intravenous Once Levander Houston, MD       metroNIDAZOLE  (FLAGYL ) IVPB 500 mg  500 mg Intravenous Once Levander Houston, MD       Current Outpatient Medications  Medication Sig Dispense Refill   acetaminophen  (TYLENOL ) 500 MG tablet Take 500-1,000 mg by mouth every 8 (eight) hours as needed for mild pain or headache.      alendronate  (FOSAMAX ) 70 MG tablet Take 1 tablet (70 mg total) by mouth every 7 (seven) days. Take with a full glass of water  on an empty stomach. 12 tablet 3   apixaban  (ELIQUIS ) 5 MG TABS tablet Take 1 tablet (5 mg  total) by mouth 2 (two) times daily. 60 tablet 5   Ascorbic Acid  (VITAMIN C ) 1000 MG tablet Take 1,000 mg by mouth daily with breakfast.      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      B-D 3CC LUER-LOK SYR 25GX1 25G X 1 3 ML MISC USE MONTHLY FOR B-12 INJECTIONS 12 each 0   Cholecalciferol  (VITAMIN D3) 50 MCG (2000 UT) TABS Take 2,000 Units by mouth daily with breakfast.     cyanocobalamin  (VITAMIN B12) 1000 MCG/ML injection INJECT 1ML INTO THE MUSCLE ONCE MONTHLY 10 mL 0   desmopressin  (DDAVP ) 0.1 MG tablet Take 100 mcg by mouth daily.     dorzolamide -timolol  (COSOPT ) 22.3-6.8 MG/ML ophthalmic solution Place 1 drop into the left eye 2 (two) times daily.     latanoprost  (XALATAN ) 0.005 % ophthalmic solution Place 1 drop into the left eye at bedtime.     levETIRAcetam (KEPPRA) 750 MG tablet Take 1 tablet (750 mg total) by mouth 2 (two) times daily. 180 tablet 0   loperamide  (IMODIUM  A-D) 2 MG tablet Take 2 mg by mouth daily as needed for diarrhea or loose stools.     metoprolol  succinate (TOPROL -XL) 25 MG 24 hr tablet Take 0.5 tablets (12.5 mg total) by mouth daily. 45 tablet 3   Netarsudil  Dimesylate 0.02 % SOLN Place 1 drop into the left eye every evening.     omeprazole  (PRILOSEC  OTC) 20 MG tablet Take 20 mg by mouth as needed.      pyridOXINE (B-6) 50 MG tablet Take 1 tablet (50 mg total) by mouth daily. 90 tablet 0   sertraline (ZOLOFT) 25 MG tablet Take 1 tablet (25 mg total) by mouth daily. 30 tablet 3   tamsulosin  (FLOMAX ) 0.4 MG CAPS capsule Take 0.4 mg by mouth at bedtime.      Allergies as of 11/13/2023   (No Known Allergies)    Family History  Problem Relation Age of Onset   Deep vein thrombosis Mother    Transient ischemic attack Mother    Heart disease Father    Cancer Maternal Aunt        unknown type; dx after 80   Cancer Cousin        maternal cousin; unknown type   Cancer Cousin        paternal cousin; unknown type; dx after 31   Stomach cancer Neg Hx    Colon cancer Neg Hx    Esophageal cancer Neg Hx    Pancreatic cancer Neg Hx    Rectal cancer Neg Hx    Colon polyps Neg Hx     Social History   Socioeconomic History   Marital status: Married    Spouse name: Not on file   Number of children: 3   Years of education: Not on file   Highest education level: Not on file  Occupational History   Occupation: Retired  Tobacco Use   Smoking status: Former    Current packs/day: 0.00    Types: Cigarettes    Quit date: 04/01/1976    Years since quitting: 47.6   Smokeless tobacco: Never  Vaping Use   Vaping status: Never Used  Substance and Sexual Activity   Alcohol use: Yes    Alcohol/week: 2.0 standard drinks of alcohol    Types: 2 Glasses of wine per week    Comment: social   Drug use: No   Sexual  activity: Not Currently  Other Topics Concern   Not on file  Social History Narrative   Not on file   Social Drivers of Health   Financial Resource Strain: Low Risk  (06/22/2023)   Overall Financial Resource Strain (CARDIA)    Difficulty of Paying Living Expenses: Not hard at all  Food Insecurity: No Food Insecurity (10/29/2023)   Hunger Vital Sign    Worried About Running Out of Food in the Last Year: Never true    Ran Out of Food in the Last Year: Never true  Transportation  Needs: No Transportation Needs (10/29/2023)   PRAPARE - Administrator, Civil Service (Medical): No    Lack of Transportation (Non-Medical): No  Physical Activity: Sufficiently Active (06/22/2023)   Exercise Vital Sign    Days of Exercise per Week: 7 days    Minutes of Exercise per Session: 30 min  Stress: No Stress Concern Present (06/22/2023)   [employer] of Occupational Health - Occupational Stress Questionnaire    Feeling of Stress: Not at all  Social Connections: Socially Integrated (10/29/2023)   Social Connection and Isolation Panel    Frequency of Communication with Friends and Family: More than three times a week    Frequency of Social Gatherings with Friends and Family: More than three times a week    Attends Religious Services: More than 4 times per year    Active Member of [employer] or Organizations: Yes    Attends Engineer, Structural: More than 4 times per year    Marital Status: Married  Catering Manager Violence: Not At Risk (10/29/2023)   Humiliation, Afraid, Rape, and Kick questionnaire    Fear of Current or Ex-Partner: No    Emotionally Abused: No    Physically Abused: No    Sexually Abused: No   Review of Systems: Gen: Denies fever, sweats or chills. No weight loss.  CV: Denies chest pain, palpitations or edema. Resp: Denies cough, shortness of breath of hemoptysis.  GI: See HPI. GU : Denies urinary burning, blood in urine, increased urinary frequency or incontinence. MS: Denies joint pain, muscles aches or weakness. Derm: Denies rash, itchiness, skin lesions or unhealing ulcers. Psych: Denies depression, anxiety, memory loss or confusion. Heme: + Easy bruising.  Neuro:  Denies headaches, dizziness or paresthesias. Endo:  + Prior history of adrenal insufficiency secondary to chronic Prednisone  use.   Physical Exam: Vital signs in last 24 hours: Temp:  [97.9 F (36.6 C)-98.5 F (36.9 C)] 97.9 F (36.6 C) (11/03 1245) Pulse Rate:   [74-89] 74 (11/03 1245) Resp:  [17-19] 19 (11/03 1245) BP: (140-146)/(85-86) 146/85 (11/03 1245) SpO2:  [97 %-100 %] 100 % (11/03 1245)   General: Alert fatigued appearing 89 year old male mildly hard of hearing in no acute distress. Head:  Normocephalic and atraumatic. Eyes:  No scleral icterus. Conjunctiva pink. Ears:  Normal auditory acuity. Nose:  No deformity, discharge or lesions. Mouth:  Dentition intact. No ulcers or lesions.  Scan yellow to white coating on tongue, possible candidiasis. Neck:  Supple. No lymphadenopathy or thyromegaly.  Lungs: Breath sounds clear throughout. No wheezes, rhonchi or crackles.  Heart: Regular rate and rhythm, no murmur. Abdomen: Soft, nondistended.  Nontender.  Positive bowel sounds to all 4 quadrants.  Left of the midline abdominal scar intact. Rectal: No external hemorrhoids.  Large soft mobile stool ball in the rectum, briefly attempted to break up stool ball manually, stopped per patient's request due to associated rectal pain. ED RN  at the bedside at time of exam. Musculoskeletal:  Symmetrical without gross deformities.  Pulses:  Normal pulses noted. Extremities:  Without clubbing or edema. Neurologic:  Alert and  oriented x 4. No focal deficits.  Skin: Upper and lower extremities with patches of ecchymosis. Psych:  Alert and cooperative. Normal mood and affect.  Intake/Output from previous day: No intake/output data recorded. Intake/Output this shift: Total I/O In: 915.8 [IV Piggyback:915.8] Out: -   Lab Results: Recent Labs    11/13/23 1037 11/13/23 1040  WBC 7.1  --   HGB 10.0* 9.9*  HCT 30.2* 29.0*  PLT 149*  --    BMET Recent Labs    11/13/23 1037 11/13/23 1040  NA 124* 126*  K 3.6 3.8  CL 92* 92*  CO2 23  --   GLUCOSE 100* 100*  BUN 9 8  CREATININE 0.81 1.00  CALCIUM  8.9  --    LFT Recent Labs    11/13/23 1037  PROT 6.7  ALBUMIN 3.9  AST 26  ALT 14  ALKPHOS 94  BILITOT 1.0     Studies/Results: CT  CHEST ABDOMEN PELVIS W CONTRAST Result Date: 11/13/2023 CLINICAL DATA:  Abdominal pain, nausea, vomiting, diarrhea for 3 days, patient feels like eggs stuck in throat EXAM: CT CHEST, ABDOMEN, AND PELVIS WITH CONTRAST TECHNIQUE: Multidetector CT imaging of the chest, abdomen and pelvis was performed following the standard protocol during bolus administration of intravenous contrast. RADIATION DOSE REDUCTION: This exam was performed according to the departmental dose-optimization program which includes automated exposure control, adjustment of the mA and/or kV according to patient size and/or use of iterative reconstruction technique. CONTRAST:  OMNIPAQUE  IOHEXOL  300 MG/ML  SOLN COMPARISON:  None Available. FINDINGS: CT CHEST FINDINGS Cardiovascular: Scattered aortic atherosclerosis. Normal heart size. Left coronary artery calcifications. No pericardial effusion. Mediastinum/Nodes: No enlarged mediastinal, hilar, or axillary lymph nodes. Esophagus is fluid and debris-filled to the level of the thoracic inlet without visible mass or obstruction distally. Thyroid  gland and trachea demonstrate no significant findings. Lungs/Pleura: Mild centrilobular and paraseptal emphysema. Mild pulmonary fibrosis with a slight apical to basal gradient, featuring irregular ground-glass and nodularity in the upper lobes (series 6, image 45), with some evidence of subpleural sparing, particularly in the right upper lobe, with dependent bibasilar subpleural bronchiolectasis. No pleural effusion or pneumothorax. Musculoskeletal: No chest wall abnormality. No acute osseous findings. CT ABDOMEN PELVIS FINDINGS Hepatobiliary: No solid liver abnormality is seen. Distended gallbladder. No gallstones, gallbladder wall thickening, or biliary dilatation. Pancreas: Unremarkable. No pancreatic ductal dilatation or surrounding inflammatory changes. Spleen: Normal in size without significant abnormality. Adrenals/Urinary Tract: Adrenal glands  are unremarkable. Kidneys are normal, without renal calculi, solid lesion, or hydronephrosis. Bladder is unremarkable. Stomach/Bowel: Stomach is within normal limits. Status post terminal ileocolectomy and ileocolic reanastomosis. Long segment wall thickening, mucosal hyperenhancement, and narrowing of the tethered appearing remnant distal ileum, affecting a segment at least 40 cm in length (series 8, image 86). Additional circumferential wall thickening of the low rectum (series 2, image 128). Moderate burden of stool balls in the distal colon and rectum, with fluid in the proximal colon. Vascular/Lymphatic: Aortic atherosclerosis. No enlarged abdominal or pelvic lymph nodes. Reproductive: Prostatomegaly with median lobe hypertrophy. Other: No abdominal wall hernia or abnormality. No ascites. Musculoskeletal: No acute osseous findings. IMPRESSION: 1. Status post terminal ileocolectomy and ileocolic reanastomosis. Long segment wall thickening, mucosal hyperenhancement, and narrowing of the tethered appearing remnant distal ileum, affecting a segment at least 40 cm in length. Findings are  consistent with active inflammatory bowel disease. No evidence of obstruction. 2. Additional circumferential wall thickening of the low rectum, consistent with proctitis. 3. Esophagus is fluid and debris-filled to the level of the thoracic inlet without visible mass or obstruction distally. Consider endoscopy to further evaluate for stricture, achalasia, or occult mass. 4. Mild pulmonary fibrosis with a slight apical to basal gradient, featuring irregular ground-glass and nodularity in the upper lobes, with some evidence of subpleural sparing, particularly in the right upper lobe, with dependent bibasilar subpleural bronchiolectasis. 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 5. Emphysema. 6. Coronary artery disease. Aortic Atherosclerosis (ICD10-I70.0) and Emphysema (ICD10-J43.9). Electronically Signed   By: Marolyn JONETTA Jaksch M.D.   On: 11/13/2023 11:50    IMPRESSION/PLAN:  89 year old male with Crohn's disease initially diagnosed in 1966 with fibrostenosing ileal Crohn's disease s/p surgery in 1968 and 2001 for small bowel resections presents with generalized weakness and nonbloody diarrhea x 3 to 4 days. CTAP with contrast consistent with s/p terminal ileocolectomy and ileocolic reanastomosis, long segment wall thickening, mucosal hyperenhancement, and narrowing of the tethered appearing remnant distal ileum, affecting a segment at least 40 cm in length consistent with active inflammatory bowel disease, no evidence of obstruction and circumferential wall thickening of the low rectum suggestive of proctitis. Large soft stool ball in rectum. C. Diff toxin/antigen ordered, specimen not yet collected.  Suspect diarrhea secondary to constipation with overflow. He takes Imodium  1 to 2 tabs daily at home for the past 6 months. Low suspicion for C. Diff.  - Clear liquid diet  - IV fluids per the hospitalist  - Hold Imodium   - SMOG enema - CBC, CMP, CRP and sed rate in am  Chronic normocytic anemia, likely secondary to Crohn's disease. No overt GI bleeding. Hg 10.  - Transfuse for Hg level < 8 - CBC in am   Mild thrombocytopenia. PLT 149.   History of GERD and infrequent dysphagia. Patient had an episode of dysphagia this morning, felt like egg was stuck in his throat/upper esophagus. Chest CT showed the esophagus is fluid and debris-filled to the level of the thoracic inlet without visible mass or obstruction distally.  - Clear liquid diet - IV fluids per the hospitalist  - Consider barium swallow study and/or swallow study with speech pathologist  - Pantoprazole  40mg  IV bid - Defer endoscopic recommendations to [name]   Colonoscopy 03/2020 showed Crohn's disease with mild active ileitis, patent end-to-end ileocolonic anastomosis with nonspecific inflammatory changes without granulomas or  dysplasia.  Hyponatremia - Management per the hospitalist  History of atrial fibrillation on Eliquis . Lasts dose of Eliquis  was today at 8am.   History of colon polyps colonoscopy 03/2020 identified a polypoid lesion in the ascending colon, path report showed fragments of polypoid colonic mucosa with low-grade dysplasia with a tubular architecture and an inflammatory polyp. -No further no further surveillance colonoscopies recommended due to age  Chest CT showed evidence of mild pulmonary fibrosis and emphysema  CAD per chest CT  Recent hospital admission 10/18 - 10/30/2023 with acute change in mental status with aphasia. CT head, CT angio head and neck and MRI brain negative for acute CVA or other intracranial abnormality.   Normal EEG was normal.  Treated with Keppra for suspected seizure activity.  Elida HERO Kennedy-Smith  11/13/2023, 3:20PM     [facility] Attending   I have taken an interval history, reviewed the chart and examined the patient.  I have personally reviewed CT images.  Majority the medical decision making was performed by me.  I agree with the Advanced Practitioner's note, impression and recommendations with the following additions:  Elderly white man with history of Crohn's ileitis status post ileocolonic anastomosis who has a fecal impaction and likely overflow diarrhea.  The patient does not think his Crohn's is symptomatic but by CT it is active though.  Hold off on steroids at this time.  He has a lot of rectal pain, he may have a stercoral ulcer though he has not had bleeding, fortunately.  He also has profound hyponatremia that could be from recently instituted Keppra.  He has a vague dysphagia without impact dysphagia and is tolerating clear liquids (water ) without difficulty at this time.  I know he has a dilated fluid/debris-filled esophagus seen on CT scan but he has a history of chronic slow swallowing without much of any impact dysphagia.  At his age and  with that history I do not know that I would work it up in the hospital t, we will see how he does.  We will proceed with a smog enema and follow-up tomorrow.  Clear liquid diet ordered.  Hospitalist to address hyponatremia.  Question if need to change from Keppra to other antiepileptic agent.  Defer to [facility] plus or minus neurology.  I do not think he needs antibiotics from a GI standpoint.  High complexity medical decision making.  Lupita CHARLENA Commander, MD, [facility] See TRACEY on call - gastroenterology for best contact person 11/13/2023 4:36 PM

## 2023-11-13 NOTE — Plan of Care (Signed)

## 2023-11-13 NOTE — H&P (Signed)
 History and Physical    Patient: Michael [name] FMW:[id number] DOB: [birth year] DOA: 11/13/2023 DOS: the patient was seen and examined on 11/13/2023 PCP: [name], MD  Patient coming from: Home  Chief Complaint:  Chief Complaint  Patient presents with   Abdominal Pain   Emesis   HPI: [name] is a 89 y.o. male with medical history significant of adrenal insufficiency, unspecified anemia, paroxysmal atrial fibrillation, history of DVT, avascular necrosis, clavicle fracture, colon polyps, COVID-19, Crohn's disease, glaucoma, pseudophakia of both sides, vitreous prolapse of the right eye, hypomagnesemia, hypokalemia, recurrent bilateral inguinal hernia, male hypogonadism, osteopenia, retinal ischemia, B12 deficiency, vitamin D  deficiency, history of pneumonia, thrombocytopenia, history of encephalitis, history of seizure-like activity, small bowel obstruction, history of TIA, hypertension, hyperlipidemia, aortic atherosclerosis, mild cognitive impairment, overactive bladder, bilateral hearing loss who came to the emergency department due to abdominal pain, nausea, vomiting and diarrhea for the past 3 days.  No sick contacts or travel history.  He has been constipated for the last 3 days despite using magnesium  hydroxide.  No melena or hematochezia.  He denied fever, chills, rhinorrhea, sore throat, wheezing or hemoptysis.  No chest pain, palpitations, diaphoresis, PND, orthopnea or pitting edema of the lower extremities. No flank pain, dysuria, frequency or hematuria.  No polyuria, polydipsia, polyphagia or blurred vision.   Lab work: Urinalysis was normal.  CBC showed a white count of 7.1, hemoglobin 10.0 g/dL and platelets 850.  Coronavirus PCR was negative.  CMP showed a glucose of 100 mg deciliter, sodium 124 and chloride 92 mmol/L, the rest of the electrolytes, hepatic and renal functions were normal.  Magnesium  was 1.7 and phosphorus 2.4 mg/dL.  Imaging: Status post terminal ileal  colectomy and ileocolic anastomosis.  Longstanding wall thickening with mucosal hyperenhancement and narrowing of the tethered appearing remnant distal ileum consistent with active inflammatory disease.  Additional circumferential wall thickening of the rectum consistent with proctitis.  Esophagus is fluid and debris feel to the level of the thoracic inlet with cell visible mass or obstruction distally.  Mild pulmonary fibrosis with a slight apical to basal gradient with ground glass and nodularity in the upper lobes, there is some evidence of subpleural sparing, particularly in the right upper lobe, with dependent bibasilar subpleural bronchiectasis.  Findings are suggestive of an alternative diagnosis to usual interstitial pneumonia.  Emphysema.  Coronary artery disease.  Aortic atherosclerosis.   ED course: Initial vital signs were temperature 98.5 F, pulse 89, respirations 17, BP 140/80 mmHg and O2 sat 97% on room air.  The patient received ceftriaxone  2 g IVPB, LR 500 mL bolus, metronidazole  500 mg IVPB and ondansetron  4 mg IVP.  GI consult was requested.  Review of Systems: As mentioned in the history of present illness. All other systems reviewed and are negative. Past Medical History:  Diagnosis Date   Adrenal insufficiency    Anemia    Atrial fibrillation (HCC)    Avascular necrosis (HCC)    Clavicle fracture 11/02/2017   Colon polyp    Colon polyps    Crohn's disease (HCC)    Glaucoma    Inguinal hernia recurrent bilateral    Low testosterone     Osteopenia    Retinal ischemia    Past Surgical History:  Procedure Laterality Date   ABDOMINAL SURGERY     BIOPSY  03/19/2020   Procedure: BIOPSY;  Surgeon: Leigh Elspeth SQUIBB, MD;  Location: [facility];  Service: Gastroenterology;;   COLONOSCOPY N/A 12/13/2015   Procedure: COLONOSCOPY;  Surgeon: Lynwood Bohr, MD;  Location: [facility];  Service: Endoscopy;  Laterality: N/A;   COLONOSCOPY WITH PROPOFOL  N/A 03/25/2019   Procedure:  COLONOSCOPY WITH PROPOFOL ;  Surgeon: Leigh Elspeth SQUIBB, MD;  Location: [facility];  Service: Gastroenterology;  Laterality: N/A;   COLONOSCOPY WITH PROPOFOL  N/A 08/26/2019   Procedure: COLONOSCOPY WITH PROPOFOL ;  Surgeon: Leigh Elspeth SQUIBB, MD;  Location: [facility];  Service: Gastroenterology;  Laterality: N/A;   COLONOSCOPY WITH PROPOFOL  N/A 03/19/2020   Procedure: COLONOSCOPY WITH PROPOFOL ;  Surgeon: Leigh Elspeth SQUIBB, MD;  Location: [facility];  Service: Gastroenterology;  Laterality: N/A;   ENDOSCOPIC MUCOSAL RESECTION N/A 08/26/2019   Procedure: ENDOSCOPIC MUCOSAL RESECTION;  Surgeon: Leigh Elspeth SQUIBB, MD;  Location: [facility];  Service: Gastroenterology;  Laterality: N/A;   INGUINAL HERNIA REPAIR Bilateral 05/21/2014   Procedure: OPEN BILATERAL INGUINAL HERNIA REPAIRS WITH MESH;  Surgeon: Donnice Lima, MD;  Location: [facility];  Service: General;  Laterality: Bilateral;   POLYPECTOMY  03/25/2019   Procedure: POLYPECTOMY;  Surgeon: Leigh Elspeth SQUIBB, MD;  Location: [facility];  Service: Gastroenterology;;   POLYPECTOMY  08/26/2019   Procedure: POLYPECTOMY;  Surgeon: Leigh Elspeth SQUIBB, MD;  Location: [facility];  Service: Gastroenterology;;   POLYPECTOMY  03/19/2020   Procedure: POLYPECTOMY;  Surgeon: Leigh Elspeth SQUIBB, MD;  Location: [facility];  Service: Gastroenterology;;   WALDEMAR  08/26/2019   Procedure: Sprayed methylene blue ;  Surgeon: Leigh Elspeth SQUIBB, MD;  Location: [facility];  Service: Gastroenterology;;   SMALL INTESTINE SURGERY  1968, 2001    related to Chrohn's disease   TONSILLECTOMY     Social History:  reports that he quit smoking about 47 years ago. His smoking use included cigarettes. He has never used smokeless tobacco. He reports current alcohol use of about 2.0 standard drinks of alcohol per week. He reports that he does not use drugs.  No Known Allergies  Family History  Problem Relation Age of Onset    Deep vein thrombosis Mother    Transient ischemic attack Mother    Heart disease Father    Cancer Maternal Aunt        unknown type; dx after 66   Cancer Cousin        maternal cousin; unknown type   Cancer Cousin        paternal cousin; unknown type; dx after 82   Stomach cancer Neg Hx    Colon cancer Neg Hx    Esophageal cancer Neg Hx    Pancreatic cancer Neg Hx    Rectal cancer Neg Hx    Colon polyps Neg Hx     Prior to Admission medications   Medication Sig Start Date End Date Taking? Authorizing Provider  acetaminophen  (TYLENOL ) 500 MG tablet Take 500-1,000 mg by mouth every 8 (eight) hours as needed for mild pain or headache.     [provider]  alendronate  (FOSAMAX ) 70 MG tablet Take 1 tablet (70 mg total) by mouth every 7 (seven) days. Take with a full glass of water  on an empty stomach. 03/20/23   [name], MD  apixaban  (ELIQUIS ) 5 MG TABS tablet Take 1 tablet (5 mg total) by mouth 2 (two) times daily. 09/06/21   Verlin Lonni BIRCH, MD  Ascorbic Acid  (VITAMIN C ) 1000 MG tablet Take 1,000 mg by mouth daily with breakfast.     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  10/03/17   [provider]  B-D 3CC LUER-LOK SYR 25GX1 25G X 1 3 ML MISC USE MONTHLY FOR B-12 INJECTIONS 01/13/23   [name], MD  Cholecalciferol  (VITAMIN D3) 50 MCG (2000 UT) TABS Take 2,000 Units by mouth daily with breakfast.    [provider]  cyanocobalamin  (VITAMIN B12) 1000 MCG/ML injection INJECT 1ML INTO THE MUSCLE ONCE MONTHLY 09/27/23   [name], MD  desmopressin  (DDAVP ) 0.1 MG tablet Take 100 mcg by mouth daily. 04/30/22   [provider]  dorzolamide -timolol  (COSOPT ) 22.3-6.8 MG/ML ophthalmic solution Place 1 drop into the left eye 2 (two) times daily. 11/05/15   [provider]  latanoprost  (XALATAN ) 0.005 %  ophthalmic solution Place 1 drop into the left eye at bedtime. 09/12/17   [provider]  levETIRAcetam (KEPPRA) 750 MG tablet Take 1 tablet (750 mg total) by mouth 2 (two) times daily. 10/30/23   Gonfa, Taye T, MD  loperamide  (IMODIUM  A-D) 2 MG tablet Take 2 mg by mouth daily as needed for diarrhea or loose stools.    [provider]  metoprolol  succinate (TOPROL -XL) 25 MG 24 hr tablet Take 0.5 tablets (12.5 mg total) by mouth daily. 11/06/23   [name], MD  Netarsudil  Dimesylate 0.02 % SOLN Place 1 drop into the left eye every evening.    [provider]  omeprazole  (PRILOSEC  OTC) 20 MG tablet Take 20 mg by mouth as needed.    [provider]  pyridOXINE (B-6) 50 MG tablet Take 1 tablet (50 mg total) by mouth daily. 10/31/23   Gonfa, Taye T, MD  sertraline (ZOLOFT) 25 MG tablet Take 1 tablet (25 mg total) by mouth daily. 11/06/23   [name], MD  tamsulosin  (FLOMAX ) 0.4 MG CAPS capsule Take 0.4 mg by mouth at bedtime. 12/20/19   [provider]    Physical Exam: Vitals:   11/13/23 0932 11/13/23 1245  BP: (!) 140/86 (!) 146/85  Pulse: 89 74  Resp: 17 19  Temp: 98.5 F (36.9 C) 97.9 F (36.6 C)  TempSrc:  Oral  SpO2: 97% 100%   Physical Exam Vitals reviewed.  Constitutional:      General: He is awake. He is not in acute distress.    Appearance: He is normal weight. He is ill-appearing.  HENT:     Head: Normocephalic.     Nose: No rhinorrhea.     Mouth/Throat:     Mouth: Mucous membranes are dry.  Eyes:     General: No scleral icterus.    Pupils: Pupils are equal, round, and reactive to light.  Neck:     Vascular: No JVD.  Cardiovascular:     Rate and Rhythm: Normal rate and regular rhythm.     Heart sounds: S1 normal and S2 normal.  Pulmonary:     Breath sounds: No wheezing, rhonchi or rales.  Abdominal:     General: Bowel sounds are normal.     Palpations: Abdomen is soft.     Tenderness: There is abdominal  tenderness. There is no right CVA tenderness or left CVA tenderness.  Musculoskeletal:     Cervical back: Neck supple.     Right lower leg: No edema.     Left lower leg: No edema.  Skin:    General: Skin is warm and dry.  Neurological:     General: No focal deficit present.     Mental Status: He is alert and oriented to person, place, and  time.  Psychiatric:        Mood and Affect: Mood normal.        Behavior: Behavior normal. Behavior is cooperative.    Data Reviewed:  Results are pending, will review when available. 09/23/2020 echocardiogram report. IMPRESSIONS:   1. Left ventricular ejection fraction, by estimation, is 55 to 60%. Left  ventricular ejection fraction by PLAX is 57 %. The left ventricle has  normal function. The left ventricle demonstrates regional wall motion  abnormalities (see scoring  diagram/findings for description). Left ventricular diastolic parameters  are consistent with Grade I diastolic dysfunction (impaired relaxation).   2. Right ventricular systolic function is normal. The right ventricular  size is normal. There is normal pulmonary artery systolic pressure.   3. The mitral valve is normal in structure. Trivial mitral valve  regurgitation. No evidence of mitral stenosis.   4. The aortic valve is tricuspid. Aortic valve regurgitation is trivial.  No aortic stenosis is present.   5. Aortic dilatation noted. There is borderline dilatation of the aortic  root, measuring 36 mm.   6. The inferior vena cava is normal in size with greater than 50%  respiratory variability, suggesting right atrial pressure of 3 mmHg.   EKG: Vent. rate 76 BPM PR interval 170 ms QRS duration 154 ms QT/QTcB 423/476 ms P-R-T axes -23 -24 14 Sinus rhythm Right bundle branch block Baseline wander in lead(s) V1  Assessment and Plan: Principal Problem:   Hyponatremia In the setting of: Daily desmopressin  use And: Increase water  intake. Observation/PCU Free water   restriction. Continue normal saline infusion. Will also continue desmopressin  - To avoid abrupt correction of sodium. Monitor intake and output. Follow-up sodium level in AM.  Active Problems:   Fecal impaction of rectum (HCC)  Likely with overflow diarrhea. GI consult appreciated. Smog enema ordered.    Crohn's disease (HCC) Does not seem to be active. Will follow GI recommendations.    PAF (paroxysmal atrial fibrillation) (HCC) No endoscopic procedures planned tomorrow. However, we will hold apixaban  tonight in case changes.    Aortic atherosclerosis Would benefit from statin therapy. Follow-up with primary care provider.    Seizure-like activity (HCC) Continue levetiracetam 750 mg p.o. twice daily.    Glaucoma Continue latanoprost  and netarsudil  drops. Follow-up with ophthalmology as an outpatient.    Advance Care Planning:   Code Status: Full Code   Consults:  gastroenterology.  Family Communication: I spoke with the patient's daughter [name] Delude.  Severity of Illness: The appropriate patient status for this patient is INPATIENT. Inpatient status is judged to be reasonable and necessary in order to provide the required intensity of service to ensure the patient's safety. The patient's presenting symptoms, physical exam findings, and initial radiographic and laboratory data in the context of their chronic comorbidities is felt to place them at high risk for further clinical deterioration. Furthermore, it is not anticipated that the patient will be medically stable for discharge from the hospital within 2 midnights of admission.   * I certify that at the point of admission it is my clinical judgment that the patient will require inpatient hospital care spanning beyond 2 midnights from the point of admission due to high intensity of service, high risk for further deterioration and high frequency of surveillance required.*  Author: Alm Dorn Castor,  MD 11/13/2023 1:27 PM  For on call review [email or web address].   This document was prepared using Dragon voice recognition software and may contain some unintended transcription errors.

## 2023-11-14 DIAGNOSIS — K6289 Other specified diseases of anus and rectum: Secondary | ICD-10-CM

## 2023-11-14 DIAGNOSIS — K50019 Crohn's disease of small intestine with unspecified complications: Secondary | ICD-10-CM | POA: Diagnosis not present

## 2023-11-14 DIAGNOSIS — K5641 Fecal impaction: Secondary | ICD-10-CM | POA: Diagnosis not present

## 2023-11-14 DIAGNOSIS — R1310 Dysphagia, unspecified: Secondary | ICD-10-CM | POA: Diagnosis not present

## 2023-11-14 LAB — CBC
HCT: 26.9 % — ABNORMAL LOW (ref 39.0–52.0)
Hemoglobin: 8.9 g/dL — ABNORMAL LOW (ref 13.0–17.0)
MCH: 29.1 pg (ref 26.0–34.0)
MCHC: 33.1 g/dL (ref 30.0–36.0)
MCV: 87.9 fL (ref 80.0–100.0)
Platelets: 128 K/uL — ABNORMAL LOW (ref 150–400)
RBC: 3.06 MIL/uL — ABNORMAL LOW (ref 4.22–5.81)
RDW: 19.2 % — ABNORMAL HIGH (ref 11.5–15.5)
WBC: 4 K/uL (ref 4.0–10.5)
nRBC: 0 % (ref 0.0–0.2)

## 2023-11-14 LAB — COMPREHENSIVE METABOLIC PANEL WITH GFR
ALT: 9 U/L (ref 0–44)
AST: 16 U/L (ref 15–41)
Albumin: 3.1 g/dL — ABNORMAL LOW (ref 3.5–5.0)
Alkaline Phosphatase: 57 U/L (ref 38–126)
Anion gap: 6 (ref 5–15)
BUN: 5 mg/dL — ABNORMAL LOW (ref 8–23)
CO2: 22 mmol/L (ref 22–32)
Calcium: 8.1 mg/dL — ABNORMAL LOW (ref 8.9–10.3)
Chloride: 106 mmol/L (ref 98–111)
Creatinine, Ser: 0.79 mg/dL (ref 0.61–1.24)
GFR, Estimated: >60 mL/min (ref >60)
Glucose, Bld: 87 mg/dL (ref 70–99)
Potassium: 3.5 mmol/L (ref 3.5–5.1)
Sodium: 135 mmol/L (ref 135–145)
Total Bilirubin: 0.6 mg/dL (ref 0.0–1.2)
Total Protein: 5.5 g/dL — ABNORMAL LOW (ref 6.5–8.1)

## 2023-11-14 LAB — SEDIMENTATION RATE: Sed Rate: 16 mm/h (ref 0–16)

## 2023-11-14 LAB — C-REACTIVE PROTEIN: CRP: 1.8 mg/dL — ABNORMAL HIGH (ref <1.0)

## 2023-11-14 MED ORDER — PANTOPRAZOLE SODIUM 40 MG IV SOLR
40.0000 mg | Freq: Two times a day (BID) | INTRAVENOUS | Status: DC
  Administered 2023-11-14 – 2023-11-15 (×3): 40 mg via INTRAVENOUS
  Filled 2023-11-14 (×3): qty 10

## 2023-11-14 MED ORDER — ZINC OXIDE 40 % EX OINT
TOPICAL_OINTMENT | Freq: Three times a day (TID) | CUTANEOUS | Status: DC
  Administered 2023-11-14: 1 via TOPICAL
  Filled 2023-11-14: qty 57

## 2023-11-14 MED ORDER — HYDROXYZINE HCL 10 MG PO TABS
10.0000 mg | ORAL_TABLET | Freq: Once | ORAL | Status: AC | PRN
  Administered 2023-11-14: 10 mg via ORAL
  Filled 2023-11-14: qty 1

## 2023-11-14 MED ORDER — SMOG ENEMA
960.0000 mL | Freq: Once | RECTAL | Status: AC
  Administered 2023-11-14: 960 mL via RECTAL
  Filled 2023-11-14: qty 960

## 2023-11-14 NOTE — Evaluation (Signed)
 Clinical/Bedside Swallow Evaluation Patient Details  Name: Michael [name] MRN: [id number] Date of Birth: [birth year]  Today's Date: 11/14/2023 Time: SLP Start Time (ACUTE ONLY): 0859 SLP Stop Time (ACUTE ONLY): 0917 SLP Time Calculation (min) (ACUTE ONLY): 18 min  Past Medical History:  Past Medical History:  Diagnosis Date   Adrenal insufficiency    Anemia    Atrial fibrillation (HCC)    Avascular necrosis (HCC)    Clavicle fracture 11/02/2017   Colon polyp    Colon polyps    Crohn's disease (HCC)    Glaucoma    Inguinal hernia recurrent bilateral    Low testosterone     Osteopenia    Retinal ischemia    Past Surgical History:  Past Surgical History:  Procedure Laterality Date   ABDOMINAL SURGERY     BIOPSY  03/19/2020   Procedure: BIOPSY;  Surgeon: Leigh Elspeth SQUIBB, MD;  Location: [facility];  Service: Gastroenterology;;   COLONOSCOPY N/A 12/13/2015   Procedure: COLONOSCOPY;  Surgeon: Lynwood Bohr, MD;  Location: [facility];  Service: Endoscopy;  Laterality: N/A;   COLONOSCOPY WITH PROPOFOL  N/A 03/25/2019   Procedure: COLONOSCOPY WITH PROPOFOL ;  Surgeon: Leigh Elspeth SQUIBB, MD;  Location: [facility];  Service: Gastroenterology;  Laterality: N/A;   COLONOSCOPY WITH PROPOFOL  N/A 08/26/2019   Procedure: COLONOSCOPY WITH PROPOFOL ;  Surgeon: Leigh Elspeth SQUIBB, MD;  Location: [facility];  Service: Gastroenterology;  Laterality: N/A;   COLONOSCOPY WITH PROPOFOL  N/A 03/19/2020   Procedure: COLONOSCOPY WITH PROPOFOL ;  Surgeon: Leigh Elspeth SQUIBB, MD;  Location: [facility];  Service: Gastroenterology;  Laterality: N/A;   ENDOSCOPIC MUCOSAL RESECTION N/A 08/26/2019   Procedure: ENDOSCOPIC MUCOSAL RESECTION;  Surgeon: Leigh Elspeth SQUIBB, MD;  Location: [facility];  Service: Gastroenterology;  Laterality: N/A;   INGUINAL HERNIA REPAIR Bilateral 05/21/2014   Procedure: OPEN BILATERAL INGUINAL HERNIA REPAIRS WITH MESH;  Surgeon: Donnice Lima, MD;  Location: [facility];  Service: General;  Laterality: Bilateral;   POLYPECTOMY  03/25/2019   Procedure: POLYPECTOMY;  Surgeon: Leigh Elspeth SQUIBB, MD;  Location: [facility];  Service: Gastroenterology;;   POLYPECTOMY  08/26/2019   Procedure: POLYPECTOMY;  Surgeon: Leigh Elspeth SQUIBB, MD;  Location: [facility];  Service: Gastroenterology;;   POLYPECTOMY  03/19/2020   Procedure: POLYPECTOMY;  Surgeon: Leigh Elspeth SQUIBB, MD;  Location: [facility];  Service: Gastroenterology;;   WALDEMAR  08/26/2019   Procedure: Sprayed methylene blue ;  Surgeon: Leigh Elspeth SQUIBB, MD;  Location: [facility];  Service: Gastroenterology;;   SMALL INTESTINE SURGERY  1968, 2001    related to Chrohn's disease   TONSILLECTOMY     HPI:  89 yo presenting 11/3 with several days of N/V/D, also feeling like he has eggs stuck in his throat. Admitted with hyponatremia and fecal impaction of rectum. Per GI consult note 11/3, pt with increased symptoms of heartburn over the past few weeks; infrequent dysphagia, food rarely gets stuck briefly in his esophagus. Chest CT showed the esophagus is fluid and debris-filled to the level of the thoracic inlet without visible mass or obstruction distally. PMH includes: PNA, h/o seizure-like activity (recent admission in October 2025 with AMS and aphasia, code stroke was canceled and CTH, CTA, and MRI all negative), SBO, TIA, mild cognitive impairment, bilateral hearing loss, Crohn's disease, glaucoma    Assessment / Plan / Recommendation  Clinical Impression  Pt has increased confusion this morning that limits clinical swallow eval, but suspect a primary esophageal component that may increase risk for aspiration especially in the setting  of AMS. Would continue clear liquid diet with full supervision until medically cleared to advance, at which time SLP will make additional suggestions for diet.   Pt is quite confused and hard to redirect from internal distracters. He coughed on  his first sip of water , with no further overt s/s of aspiration across trials from clear liquid diet including water  and jello. Oral phase looked fairly appropriate. His caregiver arrived during the evaluation and reports infrequent episodes of coughing at baseline, typically happening when he eats too fast and then tries to drink a lot of water  to wash that down. Given CT findings of fluid and debris filled esophagus, he could be at risk for post-prandial aspiration. Would provide help and careful assistance during intake for use of both aspiration and esophageal precautions.  SLP Visit Diagnosis: Dysphagia, unspecified (R13.10)    Aspiration Risk       Diet Recommendation Thin liquid (clear liquid diet (will make further recommendations once medically cleared to advance))    Liquid Administration via: Cup;Straw Medication Administration: Crushed with puree Supervision: Staff to assist with self feeding;Full supervision/cueing for compensatory strategies Compensations: Minimize environmental distractions;Slow rate;Small sips/bites Postural Changes: Seated upright at 90 degrees;Remain upright for at least 30 minutes after po intake    Other  Recommendations Oral Care Recommendations: Oral care BID     Assistance Recommended at Discharge    Functional Status Assessment Patient has had a recent decline in their functional status and demonstrates the ability to make significant improvements in function in a reasonable and predictable amount of time.  Frequency and Duration min 2x/week  2 weeks       Prognosis Prognosis for improved oropharyngeal function: Good Barriers to Reach Goals: Cognitive deficits;Other (Comment) (underlying esophageal component)      Swallow Study   General HPI: 89 yo presenting 11/3 with several days of N/V/D, also feeling like he has eggs stuck in his throat. Admitted with hyponatremia and fecal impaction of rectum. Per GI consult note 11/3, pt with increased  symptoms of heartburn over the past few weeks; infrequent dysphagia, food rarely gets stuck briefly in his esophagus. Chest CT showed the esophagus is fluid and debris-filled to the level of the thoracic inlet without visible mass or obstruction distally. PMH includes: PNA, h/o seizure-like activity (recent admission in October 2025 with AMS and aphasia, code stroke was canceled and CTH, CTA, and MRI all negative), SBO, TIA, mild cognitive impairment, bilateral hearing loss, Crohn's disease, glaucoma Type of Study: Bedside Swallow Evaluation Previous Swallow Assessment: none in chart Diet Prior to this Study: Clear liquid diet Temperature Spikes Noted: No Respiratory Status: Room air History of Recent Intubation: No Behavior/Cognition: Alert;Confused;Requires cueing Oral Cavity Assessment: Within Functional Limits Oral Care Completed by SLP: No Oral Cavity - Dentition: Adequate natural dentition (looks like he has partials) Vision: Functional for self-feeding Self-Feeding Abilities: Able to feed self;Needs set up Patient Positioning: Upright in bed Baseline Vocal Quality: Normal Volitional Cough: Cognitively unable to elicit Volitional Swallow: Unable to elicit    Oral/Motor/Sensory Function Overall Oral Motor/Sensory Function:  (not following commands for direct assessment - no overt asymmetry noted)   Ice Chips Ice chips: Not tested   Thin Liquid Thin Liquid: Impaired Presentation: Self Fed;Straw;Spoon Pharyngeal  Phase Impairments: Cough - Immediate    Nectar Thick Nectar Thick Liquid: Not tested   Honey Thick Honey Thick Liquid: Not tested   Puree Puree: Not tested   Solid     Solid: Not tested  Leita SAILOR., M.A. CCC-SLP Acute Rehabilitation Services Office: [phone]  Secure chat preferred  11/14/2023,9:21 AM

## 2023-11-14 NOTE — Progress Notes (Signed)
 Mobility Specialist - Progress Note   11/14/23 1302  Mobility  Activity Ambulated with assistance  Level of Assistance Minimal assist, patient does 75% or more  Assistive Device Front wheel walker  Distance Ambulated (ft) 200 ft  Range of Motion/Exercises Active  Activity Response Tolerated well  Mobility Referral Yes  Mobility visit 1 Mobility  Mobility Specialist Start Time (ACUTE ONLY) 1245  Mobility Specialist Stop Time (ACUTE ONLY) 1302  Mobility Specialist Time Calculation (min) (ACUTE ONLY) 17 min   Pt was found in bed and agreeable to mobilize. Min-A for STS and CG for ambulation. A little unsteady at first. At EOS returned to recliner chair with all needs met. Call bell in reach and chair alarm on. Neighbors in room and RN notified.   Erminio Leos,  Mobility Specialist Can be reached via Secure Chat

## 2023-11-14 NOTE — TOC Initial Note (Signed)
 Transition of Care [facility]) - Initial/Assessment Note   Patient Details  Name: Michael [name] MRN: [id number] Date of Birth: [birth year]  [facility]) CM/SW Contact:    Duwaine GORMAN Aran, LCSW Phone Number: 11/14/2023, 3:05 PM  Clinical Narrative: CSW spoke with daughter to complete the assessment. Daughter reported patient is set up with PT/OT through the [facility] and is getting PCS through [employer]. [facility] for additional/increased services. CSW encouraged daughter to follow back up with the [facility] regarding the assessment.  Expected Discharge Plan: Home w Home Health Services Barriers to Discharge: Continued Medical Work up  Patient Goals and CMS Choice Patient states their goals for this hospitalization and ongoing recovery are:: Resume home services Choice offered to / list presented to : NA  Expected Discharge Plan and Services In-house Referral: Clinical Social Work Post Acute Care Choice: Home Health Living arrangements for the past 2 months: Single Family Home          DME Arranged: N/A DME Agency: NA  Prior Living Arrangements/Services Living arrangements for the past 2 months: Single Family Home Lives with:: Self Patient language and need for interpreter reviewed:: Yes Do you feel safe going back to the place where you live?: Yes      Need for Family Participation in Patient Care: Yes (Comment) (Patient is currently oriented to self only.) Care giver support system in place?: Yes (comment) Current home services: Home OT, Home PT, Homehealth aide (Has home services set up through the [facility].) Criminal Activity/Legal Involvement Pertinent to Current Situation/Hospitalization: No - Comment as needed  Activities of Daily Living ADL Screening (condition at time of admission) Independently performs ADLs?: Yes (appropriate for developmental age) Is the patient deaf or have difficulty  hearing?: No Does the patient have difficulty seeing, even when wearing glasses/contacts?: No Does the patient have difficulty concentrating, remembering, or making decisions?: No  Permission Sought/Granted Permission granted to share information with : Yes, Verbal Permission Granted  Emotional Assessment Orientation: : Oriented to Self Alcohol / Substance Use: Not Applicable Psych Involvement: No (comment)  Admission diagnosis:  Enteritis [K52.9] Hyponatremia [E87.1] Esophageal obstruction [K22.2] Diarrhea, unspecified type [R19.7] Patient Active Problem List   Diagnosis Date Noted   Fecal impaction of rectum (HCC) 11/13/2023   Difficulty performing activity of daily living (ADL) 11/08/2023   Seizure-like activity (HCC) 11/05/2023   Hyponatremia 10/29/2023   Acute encephalopathy 10/28/2023   Urinary frequency 09/20/2023   Generalized weakness 09/20/2023   CAP (community acquired pneumonia) 09/20/2023   Thrombocytopenia 09/20/2023   SI (sacroiliac) joint inflammation 08/28/2023   Confusion 05/11/2022   Perforation of tympanic membrane 04/18/2022   Sensorineural hearing loss, bilateral 04/18/2022   Tinnitus 04/18/2022   Unspecified abnormalities of gait and mobility 04/18/2022   Parotitis 04/18/2022   Hypokalemia/hypomagnesia 10/26/2021   COVID-19 virus infection 10/26/2021   Hypertension 10/26/2021   Vitreous prolapse of right eye 10/05/2021   Chronic nasal congestion 09/27/2021   Intermediate stage nonexudative age-related macular degeneration of both eyes 07/01/2021   Vitreomacular adhesion of left eye 07/01/2021   Primary open angle glaucoma of left eye, severe stage 07/01/2021   Primary open angle glaucoma of right eye, severe stage 07/01/2021   Pseudophakia of both eyes 07/01/2021   Acute pain of left knee 01/14/2021   Poor balance 01/14/2021   Acute left-sided low back pain without sciatica 11/17/2020   Vestibular disequilibrium    PAF (  paroxysmal atrial  fibrillation) (HCC) 09/23/2020   Genetic testing 04/27/2020   Aortic atherosclerosis 12/24/2019   History of TIA (transient ischemic attack) 09/11/2019   History of colonic polyps    Benign neoplasm of colon    Overactive bladder 05/06/2019   Mild cognitive impairment 05/06/2019   Polyp of large intestine    Asymptomatic varicose veins of left lower extremity 12/16/2017   Hyperglycemia 11/02/2017   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 [place] [address] Phone: [phone] Fax: [phone]  Social Drivers of Health (SDOH) Social History: SDOH Screenings   Food Insecurity: No Food Insecurity (11/13/2023)  Housing: Low Risk  (11/13/2023)  Transportation Needs: No Transportation Needs (11/13/2023)  Utilities: Not At Risk (11/13/2023)  Alcohol Screen: Low Risk  (06/22/2023)  Depression (PHQ2-9): Low Risk  (09/20/2023)  Financial Resource Strain: Low Risk  (06/22/2023)  Physical Activity: Sufficiently Active (06/22/2023)  Social Connections: Socially Integrated (11/13/2023)  Stress: No Stress Concern Present (06/22/2023)  Tobacco Use: Medium Risk (11/13/2023)  Health Literacy: Adequate Health Literacy (06/22/2023)   SDOH Interventions:    Readmission Risk Interventions     No data to display

## 2023-11-14 NOTE — Progress Notes (Addendum)
 [facility] Progress Note  CC:  Diarrhea and Crohn's disease   Subjective: I communicated with the night shift RN earlier this morning, who verified patient passed a moderate amount of stool after SMOG enema given yesterday evening and had further stool output 10 PM last night. Patient stated he feels much better today, no rectal pain. Tolerating clear liquids and swallowing oral meds without difficulty.  He is alert and answers questions appropriately for me.   I called the patient's daughter, [name], and provided an update.  She stated her father was passing moderate amounts of yellow mushy/applesauce like stool while he was at her home over the past weekend.  She stated stool just flowed out of his rectum.  Patient's caretaker, Tylene,  is at the bedside.  She stated the patient's wife is currently under hospice care.   Objective:  Vital signs in last 24 hours: Temp:  [97.5 F (36.4 C)-97.9 F (36.6 C)] 97.9 F (36.6 C) (11/04 0505) Pulse Rate:  [65-74] 65 (11/04 0505) Resp:  [16-20] 16 (11/04 0505) BP: (120-146)/(69-85) 120/69 (11/04 0505) SpO2:  [98 %-100 %] 98 % (11/04 0505) Weight:  [69.9 kg] 69.9 kg (11/03 1934) Last BM Date : 11/14/23 General: Alert 89 year old male in no acute distress, mildly hard of hearing. Heart: Regular rate and rhythm, no murmurs. Pulm: Breath sounds clear throughout. Abdomen: Soft, nondistended.  Nontender.  Positive bowel sounds to all 4 quadrants.  Right  (not left) of the midline abdominal scar intact. Rectum: A moderate amount of solid soft brown stool remains in the rectal vault, I briefly tried to break up the stool ball but patient complained of associated rectal pain and asked that I stop. Patient's caretaker Tylene and RN at the bedside at time of exam.  Extremities: No lower extremity edema. Neurologic:  Alert and oriented x 4.  Speech is clear.  Moves all extremities equally. Psych:  Alert and cooperative. Normal mood and  affect.  Intake/Output from previous day: 11/03 0701 - 11/04 0700 In: 1255.8 [P.O.:240; IV Piggyback:1015.8] Out: -    Lab Results: Recent Labs    11/13/23 1037 11/13/23 1040 11/14/23 0724  WBC 7.1  --  4.0  HGB 10.0* 9.9* 8.9*  HCT 30.2* 29.0* 26.9*  PLT 149*  --  128*   BMET Recent Labs    11/13/23 1037 11/13/23 1040 11/14/23 0724  NA 124* 126* 135  K 3.6 3.8 3.5  CL 92* 92* 106  CO2 23  --  22  GLUCOSE 100* 100* 87  BUN 9 8 5*  CREATININE 0.81 1.00 0.79  CALCIUM  8.9  --  8.1*   LFT Recent Labs    11/14/23 0724  PROT 5.5*  ALBUMIN 3.1*  AST 16  ALT 9  ALKPHOS 57  BILITOT 0.6    CT CHEST ABDOMEN PELVIS W CONTRAST Result Date: 11/13/2023 CLINICAL DATA:  Abdominal pain, nausea, vomiting, diarrhea for 3 days, patient feels like eggs stuck in throat EXAM: CT CHEST, ABDOMEN, AND PELVIS WITH CONTRAST TECHNIQUE: Multidetector CT imaging of the chest, abdomen and pelvis was performed following the standard protocol during bolus administration of intravenous contrast. RADIATION DOSE REDUCTION: This exam was performed according to the departmental dose-optimization program which includes automated exposure control, adjustment of the mA and/or kV according to patient size and/or use of iterative reconstruction technique. CONTRAST:  OMNIPAQUE  IOHEXOL  300 MG/ML  SOLN COMPARISON:  None Available. FINDINGS: CT CHEST FINDINGS Cardiovascular: Scattered aortic atherosclerosis. Normal heart size. Left  coronary artery calcifications. No pericardial effusion. Mediastinum/Nodes: No enlarged mediastinal, hilar, or axillary lymph nodes. Esophagus is fluid and debris-filled to the level of the thoracic inlet without visible mass or obstruction distally. Thyroid  gland and trachea demonstrate no significant findings. Lungs/Pleura: Mild centrilobular and paraseptal emphysema. Mild pulmonary fibrosis with a slight apical to basal gradient, featuring irregular ground-glass and nodularity in  the upper lobes (series 6, image 45), with some evidence of subpleural sparing, particularly in the right upper lobe, with dependent bibasilar subpleural bronchiolectasis. No pleural effusion or pneumothorax. Musculoskeletal: No chest wall abnormality. No acute osseous findings. CT ABDOMEN PELVIS FINDINGS Hepatobiliary: No solid liver abnormality is seen. Distended gallbladder. No gallstones, gallbladder wall thickening, or biliary dilatation. Pancreas: Unremarkable. No pancreatic ductal dilatation or surrounding inflammatory changes. Spleen: Normal in size without significant abnormality. Adrenals/Urinary Tract: Adrenal glands are unremarkable. Kidneys are normal, without renal calculi, solid lesion, or hydronephrosis. Bladder is unremarkable. Stomach/Bowel: Stomach is within normal limits. Status post terminal ileocolectomy and ileocolic reanastomosis. Long segment wall thickening, mucosal hyperenhancement, and narrowing of the tethered appearing remnant distal ileum, affecting a segment at least 40 cm in length (series 8, image 86). Additional circumferential wall thickening of the low rectum (series 2, image 128). Moderate burden of stool balls in the distal colon and rectum, with fluid in the proximal colon. Vascular/Lymphatic: Aortic atherosclerosis. No enlarged abdominal or pelvic lymph nodes. Reproductive: Prostatomegaly with median lobe hypertrophy. Other: No abdominal wall hernia or abnormality. No ascites. Musculoskeletal: No acute osseous findings. IMPRESSION: 1. Status post terminal ileocolectomy and ileocolic reanastomosis. Long segment wall thickening, mucosal hyperenhancement, and narrowing of the tethered appearing remnant distal ileum, affecting a segment at least 40 cm in length. Findings are consistent with active inflammatory bowel disease. No evidence of obstruction. 2. Additional circumferential wall thickening of the low rectum, consistent with proctitis. 3. Esophagus is fluid and  debris-filled to the level of the thoracic inlet without visible mass or obstruction distally. Consider endoscopy to further evaluate for stricture, achalasia, or occult mass. 4. Mild pulmonary fibrosis with a slight apical to basal gradient, featuring irregular ground-glass and nodularity in the upper lobes, with some evidence of subpleural sparing, particularly in the right upper lobe, with dependent bibasilar subpleural bronchiolectasis. 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 5. Emphysema. 6. Coronary artery disease. Aortic Atherosclerosis (ICD10-I70.0) and Emphysema (ICD10-J43.9). Electronically Signed   By: Marolyn JONETTA Jaksch M.D.   On: 11/13/2023 11:50    Patient  Profile: Michael [name] is a 89 y.o. male with a past medical history of glaucoma, atrial fibrillation on Eliquis , hepatic steatosis, colon polyps and Crohn's disease initially diagnosed in 1966 with fibrostenosing ileal Crohn's disease s/p surgery in 1968 and 2001 for small bowel resections. Previously treated with chronic Prednisone  and subsequently developed adrenal insufficiency and avascular necrosis of the hips. He was last seen in our outpatient GI clinic by [name] 11/16/2022 and no further colonoscopies were recommended due to age and intermittent treatment with Budesonide  PRN  for Crohn's flares was recommended, patient previously declined biologic therapy. Admitted 11/13/2023 with generalized weakness and diarrhea x 3 days.   Assessment / Plan:  89 year old male with Crohn's disease initially diagnosed in 1966 with fibrostenosing ileal Crohn's disease s/p surgery in 1968 and 2001 for small bowel resections presents with generalized weakness and nonbloody diarrhea x 3 to 4 days. CTAP with contrast consistent with s/p terminal ileocolectomy and ileocolic reanastomosis, long segment wall thickening, mucosal hyperenhancement, and narrowing of the tethered appearing  remnant distal ileum, affecting a segment at  least 40 cm in length consistent with active inflammatory bowel disease, no evidence of obstruction and circumferential wall thickening of the low rectum suggestive of proctitis. Large soft stool ball in rectum. C. Diff toxin/antigen ordered, specimen not yet collected.  Suspect diarrhea secondary to constipation with overflow. He takes Imodium  1 to 2 tabs daily at home for the past 6 months. CRP 1.8. Sed rate 16.  Low suspicion for C. Diff. Received a SMOG enema yesterday evening, resulted in passing a moderate amount of watery stool per the nursing staff.  Patient stated he feels much better today, however, on rectal exam a moderate amount of soft solid brown stool remains in the rectal vault. Disimpaction was not performed, patient endorsed having active rectal pain on exam and he felt he could not tolerate disimpaction at this time.  He is willing to receive another SMOG enema. - Clear liquid diet  - IV fluids per the hospitalist  - Hold Imodium   - Repeat SMOG enema x 1 now - May require pain med prior to manual fecal disimpaction if repeat smog enema ineffective - OOB to chair and bedside commode with assistance  - Await further recommendations per [name]   Chronic normocytic anemia, likely secondary to Crohn's disease. No overt GI bleeding. Hg 10 -> 9.9 -> today Hg 8.9.  - Transfuse for Hg level < 8 - CBC in am    Mild thrombocytopenia. PLT 149 -> 128.    History of GERD and infrequent dysphagia. Patient had an episode of dysphagia this morning, felt like egg was stuck in his throat/upper esophagus. Chest CT showed the esophagus is fluid and debris-filled to the level of the thoracic inlet without visible mass or obstruction distally.  Patient was seen by speech pathologist earlier today, exam was limited as patient was assessed to be confused and was hard to redirect.  He coughed on his first sip of water  without overt signs of aspiration. Speech pathologist suspects esophageal dysphagia,  potentially at risk for aspiration. - Clear liquid diet - IV fluids per the hospitalist  - Consider barium swallow study and/or swallow study with speech pathologist  - Pantoprazole  40mg  IV bid - Defer endoscopic recommendations to [name]    Colonoscopy 03/2020 showed Crohn's disease with mild active ileitis, patent end-to-end ileocolonic anastomosis with nonspecific inflammatory changes without granulomas or dysplasia.   Hyponatremia - Management per the hospitalist   History of atrial fibrillation on Eliquis . Lasts dose of Eliquis  was 8am on 11/3.   History of colon polyps colonoscopy 03/2020 identified a polypoid lesion in the ascending colon, path report showed fragments of polypoid colonic mucosa with low-grade dysplasia with a tubular architecture and an inflammatory polyp. -No further no further surveillance colonoscopies recommended due to age   Chest CT showed evidence of mild pulmonary fibrosis and emphysema   CAD per chest CT   Recent hospital admission 10/18 - 10/30/2023 with acute change in mental status with aphasia. CT head, CT angio head and neck and MRI brain negative for acute CVA or other intracranial abnormality.   Normal EEG was normal.  Treated with Keppra for suspected seizure activity.    LOS: 0 days   Elida CHRISTELLA Shawl  11/14/2023, 12:01 PM    [facility] Attending   I have taken an interval history, reviewed the chart and examined the patient.  Majority of medical decision making was performed by me.  I agree with the Advanced Practitioner's  note, impression and recommendations with the following additions:   Fecal impaction with overflow diarrhea suspected. Rectal pain Crohn's disease question contribution to current problems Vague dysphagia Some delirium Resolved hyponatremia  Will administer another smog enema and reassess tomorrow.  I canceled C. difficile testing.  This is not C. difficile.  Not watery stools. Reassess swallowing  tomorrow clinically.  Will consider EGD.  Continue to hold Eliquis . Consider steroid treatment of Crohn's  Lupita CHARLENA Commander, MD, [facility] See TRACEY on call - gastroenterology for best contact person 11/14/2023 3:08 PM

## 2023-11-14 NOTE — Care Management Obs Status (Signed)
 MEDICARE OBSERVATION STATUS NOTIFICATION   Patient Details  Name: Michael [name] MRN: [id number] Date of Birth: December 08, 1932   Medicare Observation Status Notification Given:  Other (see comment) (Will be mailed to: [address] [place], [place] [address])    Duwaine GORMAN Aran, LCSW 11/14/2023, 2:54 PM

## 2023-11-14 NOTE — Progress Notes (Addendum)
 PROGRESS NOTE    Michael [name]  FMW:[id number] DOB: 1932-10-12 DOA: 11/13/2023 PCP: [name], MD   Brief Narrative: HPI per Dr. Celinda 89 y.o. male with medical history significant of adrenal insufficiency, unspecified anemia, paroxysmal atrial fibrillation, history of DVT, avascular necrosis, clavicle fracture, colon polyps, COVID-19, Crohn's disease, glaucoma, pseudophakia of both sides, vitreous prolapse of the right eye, hypomagnesemia, hypokalemia, recurrent bilateral inguinal hernia, male hypogonadism, osteopenia, retinal ischemia, B12 deficiency, vitamin D  deficiency, history of pneumonia, thrombocytopenia, history of encephalitis, history of seizure-like activity, small bowel obstruction, history of TIA, hypertension, hyperlipidemia, aortic atherosclerosis, mild cognitive impairment, overactive bladder, bilateral hearing loss who came to the emergency department due to abdominal pain, nausea, vomiting and diarrhea for the past 3 days.  No sick contacts or travel history.  He has been constipated for the last 3 days despite using magnesium  hydroxide.  No melena or hematochezia.  He denied fever, chills, rhinorrhea, sore throat, wheezing or hemoptysis.  No chest pain, palpitations, diaphoresis, PND, orthopnea or pitting edema of the lower extremities. No flank pain, dysuria, frequency or hematuria.  No polyuria, polydipsia, polyphagia or blurred vision.    Lab work: Urinalysis was normal.  CBC showed a white count of 7.1, hemoglobin 10.0 g/dL and platelets 850.  Coronavirus PCR was negative.  CMP showed a glucose of 100 mg deciliter, sodium 124 and chloride 92 mmol/L, the rest of the electrolytes, hepatic and renal functions were normal.  Magnesium  was 1.7 and phosphorus 2.4 mg/dL.  CT Status post terminal ileocolectomy and ileocolic reanastomosis. Long segment wall thickening, mucosal hyperenhancement, and narrowing of the tethered appearing remnant distal ileum, affecting a segment at  least 40 cm in length. Findings are consistent with active inflammatory bowel disease. No evidence of obstruction. Additional circumferential wall thickening of the low rectum, consistent with proctitis. Esophagus is fluid and debris-filled to the level of the thoracic inlet without visible mass or obstruction distally. Consider endoscopy to further evaluate for stricture, achalasia, or occult mass. Mild pulmonary fibrosis with a slight apical to basal gradient, featuring irregular ground-glass and nodularity in the upper lobes, with some evidence of subpleural sparing, particularly in the right upper lobe, with dependent bibasilar subpleural bronchiolectasis. Findings are suggestive of an alternative diagnosis to UIP.Emphysema.  Assessment & Plan:   Principal Problem:   Hyponatremia Active Problems:   PAF (paroxysmal atrial fibrillation) (HCC)   Crohn's disease (HCC)   Glaucoma   Aortic atherosclerosis   Seizure-like activity (HCC)   Fecal impaction of rectum (HCC)    Hypovolemic hyponatremia in the setting of desmopressin  and zoloft use with poor po intake.  Patient was started on Zoloft a week ago since he is going through grief and depression as his wife is currently in hospice.  Daughter also tells me that he has been on DDAVP  for over a year for nocturnal bedwetting.  She does not think that DDAVP  has helped this condition.  She is in agreement for me to stop the DDAVP  and just watching.  Will continue Zoloft since he is going through anxiety and grief about his wife's medical condition.  Sodium 135 from 126 yesterday.(Over 24 hours) he was getting treated with normal saline.   Active Problems:   Fecal impaction of rectum with overflow diarrhea appreciate GI consult.  Currently Eliquis  on hold for possible egd in am patient had couple of bowel movements today smog enema ordered by GI.   ?Achalsia vs occult mass vs stricture-Gi following    Crohn's disease (  HCC) Does not seem  to be active. GI following.     PAF (paroxysmal atrial fibrillation) (HCC) Eliquis  on hold for possible EGD tomorrow not on     Aortic atherosclerosis Statin therapy he had recent TIAs unclear why he is not on a statin.     Seizure-like activity (HCC) Continue levetiracetam 750 mg p.o. twice daily. Restart pyridoxine to help with irritable side effects from Keppra     Glaucoma Continue latanoprost  and netarsudil  drops. Follow-up with ophthalmology as an outpatient.   Estimated body mass index is 21.49 kg/m as calculated from the following:   Height as of this encounter: 5' 11 (1.803 m).   Weight as of this encounter: 69.9 kg.  DVT prophylaxis:scd Code Status: full Family Communication: dw daughter Disposition Plan:  Status is: Observation   Consultants:  gi  Procedures:none Antimicrobials:none  Subjective: He is resting in bed had a BM this morning clearly upset about not being able to sleep and being interrupted  Objective: Vitals:   11/13/23 1520 11/13/23 1934 11/13/23 2113 11/14/23 0505  BP: 136/69  130/77 120/69  Pulse: 70  72 65  Resp: 20  18 16   Temp: 97.7 F (36.5 C)  (!) 97.5 F (36.4 C) 97.9 F (36.6 C)  TempSrc: Oral  Oral Oral  SpO2: 100%  100% 98%  Weight:  69.9 kg    Height:  5' 11 (1.803 m)      Intake/Output Summary (Last 24 hours) at 11/14/2023 1205 Last data filed at 11/14/2023 1017 Gross per 24 hour  Intake 580 ml  Output --  Net 580 ml   Filed Weights   11/13/23 1934  Weight: 69.9 kg    Examination:  General exam: Appears in nad chronically ill appearing Respiratory system: Clear to auscultation. Respiratory effort normal. Cardiovascular system: reg Gastrointestinal system: Abdomen is nondistended, soft and nontender. No organomegaly or masses felt. Normal bowel sounds heard. Central nervous system: awake alert Extremities: no edema  Data Reviewed: I have personally reviewed following labs and imaging studies  CBC: Recent  Labs  Lab 11/13/23 1037 11/13/23 1040 11/14/23 0724  WBC 7.1  --  4.0  NEUTROABS 3.9  --   --   HGB 10.0* 9.9* 8.9*  HCT 30.2* 29.0* 26.9*  MCV 84.8  --  87.9  PLT 149*  --  128*   Basic Metabolic Panel: Recent Labs  Lab 11/13/23 1037 11/13/23 1040 11/13/23 1519 11/14/23 0724  NA 124* 126*  --  135  K 3.6 3.8  --  3.5  CL 92* 92*  --  106  CO2 23  --   --  22  GLUCOSE 100* 100*  --  87  BUN 9 8  --  5*  CREATININE 0.81 1.00  --  0.79  CALCIUM  8.9  --   --  8.1*  MG  --   --  1.7  --   PHOS  --   --  2.4*  --    GFR: Estimated Creatinine Clearance: 61.9 mL/min (by C-G formula based on SCr of 0.79 mg/dL). Liver Function Tests: Recent Labs  Lab 11/13/23 1037 11/14/23 0724  AST 26 16  ALT 14 9  ALKPHOS 94 57  BILITOT 1.0 0.6  PROT 6.7 5.5*  ALBUMIN 3.9 3.1*   No results for input(s): LIPASE, AMYLASE in the last 168 hours. No results for input(s): AMMONIA in the last 168 hours. Coagulation Profile: No results for input(s): INR, PROTIME in the last 168 hours. Cardiac  Enzymes: No results for input(s): CKTOTAL, CKMB, CKMBINDEX, TROPONINI in the last 168 hours. BNP (last 3 results) Recent Labs    10/02/23 1542  PROBNP 242.0*   HbA1C: No results for input(s): HGBA1C in the last 72 hours. CBG: No results for input(s): GLUCAP in the last 168 hours. Lipid Profile: No results for input(s): CHOL, HDL, LDLCALC, TRIG, CHOLHDL, LDLDIRECT in the last 72 hours. Thyroid  Function Tests: No results for input(s): TSH, T4TOTAL, FREET4, T3FREE, THYROIDAB in the last 72 hours. Anemia Panel: No results for input(s): VITAMINB12, FOLATE, FERRITIN, TIBC, IRON, RETICCTPCT in the last 72 hours. Sepsis Labs: No results for input(s): PROCALCITON, LATICACIDVEN in the last 168 hours.  Recent Results (from the past 240 hours)  SARS Coronavirus 2 by RT PCR (hospital order, performed in [facility] hospital lab) *cepheid single  result test* Anterior Nasal Swab     Status: None   Collection Time: 11/13/23 10:35 AM   Specimen: Anterior Nasal Swab  Result Value Ref Range Status   SARS Coronavirus 2 by RT PCR NEGATIVE NEGATIVE Final    Comment: (NOTE) SARS-CoV-2 target nucleic acids are NOT DETECTED.  The SARS-CoV-2 RNA is generally detectable in upper and lower respiratory specimens during the acute phase of infection. The lowest concentration of SARS-CoV-2 viral copies this assay can detect is 250 copies / mL. A negative result does not preclude SARS-CoV-2 infection and should not be used as the sole basis for treatment or other patient management decisions.  A negative result may occur with improper specimen collection / handling, submission of specimen other than nasopharyngeal swab, presence of viral mutation(s) within the areas targeted by this assay, and inadequate number of viral copies (<250 copies / mL). A negative result must be combined with clinical observations, patient history, and epidemiological information.  Fact Sheet for Patients:   roadlaptop.co.za  Fact Sheet for Healthcare Providers: http://kim-miller.com/  This test is not yet approved or  cleared by the [place]  FDA and has been authorized for detection and/or diagnosis of SARS-CoV-2 by FDA under an Emergency Use Authorization (EUA).  This EUA will remain in effect (meaning this test can be used) for the duration of the COVID-19 declaration under Section 564(b)(1) of the Act, 21 U.S.C. section 360bbb-3(b)(1), unless the authorization is terminated or revoked sooner.  Performed at [facility], [address], [place], [place] [address]          Radiology Studies: CT CHEST ABDOMEN PELVIS W CONTRAST Result Date: 11/13/2023 CLINICAL DATA:  Abdominal pain, nausea, vomiting, diarrhea for 3 days, patient feels like eggs stuck in throat EXAM: CT CHEST, ABDOMEN, AND  PELVIS WITH CONTRAST TECHNIQUE: Multidetector CT imaging of the chest, abdomen and pelvis was performed following the standard protocol during bolus administration of intravenous contrast. RADIATION DOSE REDUCTION: This exam was performed according to the departmental dose-optimization program which includes automated exposure control, adjustment of the mA and/or kV according to patient size and/or use of iterative reconstruction technique. CONTRAST:  100mL OMNIPAQUE  IOHEXOL  300 MG/ML  SOLN COMPARISON:  None Available. FINDINGS: CT CHEST FINDINGS Cardiovascular: Scattered aortic atherosclerosis. Normal heart size. Left coronary artery calcifications. No pericardial effusion. Mediastinum/Nodes: No enlarged mediastinal, hilar, or axillary lymph nodes. Esophagus is fluid and debris-filled to the level of the thoracic inlet without visible mass or obstruction distally. Thyroid  gland and trachea demonstrate no significant findings. Lungs/Pleura: Mild centrilobular and paraseptal emphysema. Mild pulmonary fibrosis with a slight apical to basal gradient, featuring irregular ground-glass and nodularity in the  upper lobes (series 6, image 45), with some evidence of subpleural sparing, particularly in the right upper lobe, with dependent bibasilar subpleural bronchiolectasis. No pleural effusion or pneumothorax. Musculoskeletal: No chest wall abnormality. No acute osseous findings. CT ABDOMEN PELVIS FINDINGS Hepatobiliary: No solid liver abnormality is seen. Distended gallbladder. No gallstones, gallbladder wall thickening, or biliary dilatation. Pancreas: Unremarkable. No pancreatic ductal dilatation or surrounding inflammatory changes. Spleen: Normal in size without significant abnormality. Adrenals/Urinary Tract: Adrenal glands are unremarkable. Kidneys are normal, without renal calculi, solid lesion, or hydronephrosis. Bladder is unremarkable. Stomach/Bowel: Stomach is within normal limits. Status post terminal  ileocolectomy and ileocolic reanastomosis. Long segment wall thickening, mucosal hyperenhancement, and narrowing of the tethered appearing remnant distal ileum, affecting a segment at least 40 cm in length (series 8, image 86). Additional circumferential wall thickening of the low rectum (series 2, image 128). Moderate burden of stool balls in the distal colon and rectum, with fluid in the proximal colon. Vascular/Lymphatic: Aortic atherosclerosis. No enlarged abdominal or pelvic lymph nodes. Reproductive: Prostatomegaly with median lobe hypertrophy. Other: No abdominal wall hernia or abnormality. No ascites. Musculoskeletal: No acute osseous findings. IMPRESSION: 1. Status post terminal ileocolectomy and ileocolic reanastomosis. Long segment wall thickening, mucosal hyperenhancement, and narrowing of the tethered appearing remnant distal ileum, affecting a segment at least 40 cm in length. Findings are consistent with active inflammatory bowel disease. No evidence of obstruction. 2. Additional circumferential wall thickening of the low rectum, consistent with proctitis. 3. Esophagus is fluid and debris-filled to the level of the thoracic inlet without visible mass or obstruction distally. Consider endoscopy to further evaluate for stricture, achalasia, or occult mass. 4. Mild pulmonary fibrosis with a slight apical to basal gradient, featuring irregular ground-glass and nodularity in the upper lobes, with some evidence of subpleural sparing, particularly in the right upper lobe, with dependent bibasilar subpleural bronchiolectasis. 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 5. Emphysema. 6. Coronary artery disease. Aortic Atherosclerosis (ICD10-I70.0) and Emphysema (ICD10-J43.9). Electronically Signed   By: Marolyn JONETTA Jaksch M.D.   On: 11/13/2023 11:50    Scheduled Meds:  desmopressin   0.1 mg Oral QHS   dorzolamide -timolol   1 drop Left Eye BID   latanoprost   1 drop Left Eye QHS   levETIRAcetam   750 mg Oral BID   metoprolol  succinate  12.5 mg Oral Daily   Netarsudil  Dimesylate  1 drop Left Eye QPM   pantoprazole  (PROTONIX ) IV  40 mg Intravenous Q12H   sertraline  25 mg Oral Daily   SMOG  960 mL Rectal Once   Continuous Infusions:   LOS: 0 days     Almarie KANDICE Hoots, MD  11/14/2023, 12:05 PM

## 2023-11-15 ENCOUNTER — Encounter (HOSPITAL_COMMUNITY)
Admission: EM | Disposition: A | Discharge status: Home Health | Payer: Self-pay | Source: Skilled Nursing Facility | Attending: Internal Medicine

## 2023-11-15 ENCOUNTER — Observation Stay (HOSPITAL_COMMUNITY)

## 2023-11-15 ENCOUNTER — Encounter (HOSPITAL_COMMUNITY): Payer: Self-pay

## 2023-11-15 DIAGNOSIS — K222 Esophageal obstruction: Principal | ICD-10-CM

## 2023-11-15 DIAGNOSIS — K6289 Other specified diseases of anus and rectum: Secondary | ICD-10-CM | POA: Diagnosis not present

## 2023-11-15 DIAGNOSIS — R1310 Dysphagia, unspecified: Secondary | ICD-10-CM | POA: Diagnosis not present

## 2023-11-15 DIAGNOSIS — Z87891 Personal history of nicotine dependence: Secondary | ICD-10-CM

## 2023-11-15 DIAGNOSIS — K317 Polyp of stomach and duodenum: Secondary | ICD-10-CM | POA: Diagnosis not present

## 2023-11-15 DIAGNOSIS — K5641 Fecal impaction: Secondary | ICD-10-CM

## 2023-11-15 DIAGNOSIS — R197 Diarrhea, unspecified: Secondary | ICD-10-CM

## 2023-11-15 DIAGNOSIS — I10 Essential (primary) hypertension: Secondary | ICD-10-CM

## 2023-11-15 HISTORY — PX: FLEXIBLE SIGMOIDOSCOPY: SHX5431

## 2023-11-15 HISTORY — PX: ESOPHAGOGASTRODUODENOSCOPY: SHX5428

## 2023-11-15 LAB — BASIC METABOLIC PANEL WITH GFR
Anion gap: 5 (ref 5–15)
BUN: <5 mg/dL — ABNORMAL LOW (ref 8–23)
CO2: 22 mmol/L (ref 22–32)
Calcium: 8.1 mg/dL — ABNORMAL LOW (ref 8.9–10.3)
Chloride: 103 mmol/L (ref 98–111)
Creatinine, Ser: 0.85 mg/dL (ref 0.61–1.24)
GFR, Estimated: >60 mL/min (ref >60)
Glucose, Bld: 86 mg/dL (ref 70–99)
Potassium: 4.2 mmol/L (ref 3.5–5.1)
Sodium: 130 mmol/L — ABNORMAL LOW (ref 135–145)

## 2023-11-15 LAB — CBC
HCT: 25.2 % — ABNORMAL LOW (ref 39.0–52.0)
Hemoglobin: 8.2 g/dL — ABNORMAL LOW (ref 13.0–17.0)
MCH: 28.7 pg (ref 26.0–34.0)
MCHC: 32.5 g/dL (ref 30.0–36.0)
MCV: 88.1 fL (ref 80.0–100.0)
Platelets: 107 K/uL — ABNORMAL LOW (ref 150–400)
RBC: 2.86 MIL/uL — ABNORMAL LOW (ref 4.22–5.81)
RDW: 19.5 % — ABNORMAL HIGH (ref 11.5–15.5)
WBC: 4.6 K/uL (ref 4.0–10.5)
nRBC: 0 % (ref 0.0–0.2)

## 2023-11-15 LAB — IRON AND TIBC
Iron: 27 ug/dL — ABNORMAL LOW (ref 45–182)
Saturation Ratios: 13 % — ABNORMAL LOW (ref 17.9–39.5)
TIBC: 207 ug/dL — ABNORMAL LOW (ref 250–450)
UIBC: 180 ug/dL

## 2023-11-15 LAB — FERRITIN: Ferritin: 160 ng/mL (ref 24–336)

## 2023-11-15 SURGERY — EGD (ESOPHAGOGASTRODUODENOSCOPY)
Anesthesia: General

## 2023-11-15 MED ORDER — POLYETHYLENE GLYCOL 3350 17 GM/SCOOP PO POWD
238.0000 g | Freq: Once | ORAL | Status: AC
  Administered 2023-11-15: 238 g via ORAL
  Filled 2023-11-15: qty 238

## 2023-11-15 MED ORDER — LACTATED RINGERS IV SOLN: INTRAVENOUS | Status: AC

## 2023-11-15 MED ORDER — KETOROLAC TROMETHAMINE 0.5 % OP SOLN
1.0000 [drp] | Freq: Four times a day (QID) | OPHTHALMIC | Status: AC
  Administered 2023-11-15 – 2023-11-16 (×2): 1 [drp] via OPHTHALMIC
  Filled 2023-11-15: qty 5

## 2023-11-15 MED ORDER — EPHEDRINE SULFATE-NACL 50-0.9 MG/10ML-% IV SOSY
PREFILLED_SYRINGE | INTRAVENOUS | Status: DC | PRN
  Administered 2023-11-15: 5 mg via INTRAVENOUS
  Administered 2023-11-15: 10 mg via INTRAVENOUS

## 2023-11-15 MED ORDER — PANTOPRAZOLE SODIUM 40 MG PO TBEC
40.0000 mg | DELAYED_RELEASE_TABLET | Freq: Every day | ORAL | Status: DC
  Administered 2023-11-16 – 2023-11-17 (×2): 40 mg via ORAL
  Filled 2023-11-15 (×3): qty 1

## 2023-11-15 MED ORDER — LIDOCAINE HCL (PF) 2 % IJ SOLN
INTRAMUSCULAR | Status: DC | PRN
  Administered 2023-11-15: 100 mg via INTRADERMAL

## 2023-11-15 MED ORDER — BRIMONIDINE TARTRATE 0.2 % OP SOLN
1.0000 [drp] | Freq: Two times a day (BID) | OPHTHALMIC | Status: DC
  Administered 2023-11-15 – 2023-11-17 (×5): 1 [drp] via OPHTHALMIC
  Filled 2023-11-15: qty 5

## 2023-11-15 MED ORDER — VITAMIN D 25 MCG (1000 UNIT) PO TABS
2000.0000 [IU] | ORAL_TABLET | Freq: Every day | ORAL | Status: DC
  Administered 2023-11-16 – 2023-11-17 (×2): 2000 [IU] via ORAL
  Filled 2023-11-15 (×2): qty 2

## 2023-11-15 MED ORDER — PROPOFOL 10 MG/ML IV BOLUS
INTRAVENOUS | Status: DC | PRN
  Administered 2023-11-15: 50 mg via INTRAVENOUS
  Administered 2023-11-15: 100 mg via INTRAVENOUS
  Administered 2023-11-15: 20 mg via INTRAVENOUS

## 2023-11-15 MED ORDER — BSS IO SOLN
15.0000 mL | Freq: Once | INTRAOCULAR | Status: AC
  Administered 2023-11-15: 15 mL
  Filled 2023-11-15: qty 15

## 2023-11-15 MED ORDER — DEXAMETHASONE SOD PHOSPHATE PF 10 MG/ML IJ SOLN
INTRAMUSCULAR | Status: DC | PRN
  Administered 2023-11-15: 10 mg via INTRAVENOUS

## 2023-11-15 MED ORDER — SODIUM CHLORIDE 0.9 % IV SOLN: INTRAVENOUS | Status: DC

## 2023-11-15 MED ORDER — SODIUM CHLORIDE 0.9 % IV SOLN
INTRAVENOUS | Status: AC | PRN
  Administered 2023-11-15: 500 mL via INTRAVENOUS

## 2023-11-15 MED ORDER — SUCCINYLCHOLINE CHLORIDE 200 MG/10ML IV SOSY
PREFILLED_SYRINGE | INTRAVENOUS | Status: DC | PRN
  Administered 2023-11-15: 100 mg via INTRAVENOUS

## 2023-11-15 MED ORDER — ONDANSETRON HCL 4 MG/2ML IJ SOLN
INTRAMUSCULAR | Status: DC | PRN
Start: 2023-11-15 — End: 2023-11-15
  Administered 2023-11-15: 4 mg via INTRAVENOUS

## 2023-11-15 MED ORDER — METOCLOPRAMIDE HCL 5 MG/ML IJ SOLN
5.0000 mg | Freq: Once | INTRAMUSCULAR | Status: AC
  Administered 2023-11-15: 5 mg via INTRAVENOUS
  Filled 2023-11-15: qty 2

## 2023-11-15 MED ORDER — PHENYLEPHRINE HCL-NACL 20-0.9 MG/250ML-% IV SOLN
INTRAVENOUS | Status: DC | PRN
  Administered 2023-11-15: 40 ug via INTRAVENOUS

## 2023-11-15 MED ORDER — PYRIDOXINE HCL 25 MG PO TABS
50.0000 mg | ORAL_TABLET | Freq: Every day | ORAL | Status: DC
  Administered 2023-11-15 – 2023-11-17 (×3): 50 mg via ORAL
  Filled 2023-11-15 (×3): qty 2

## 2023-11-15 MED ORDER — BISACODYL 10 MG RE SUPP
10.0000 mg | Freq: Once | RECTAL | Status: AC
  Administered 2023-11-15: 10 mg via RECTAL
  Filled 2023-11-15: qty 1

## 2023-11-15 NOTE — Op Note (Signed)
 [facility] Patient Name: Michael [name] Procedure Date: 11/15/2023 MRN: [id number] Attending MD: [name] , MD, [id number] Date of Birth: Jun 27, 1932 CSN: [id number] Age: 89 Admit Type: Inpatient Procedure:                Upper GI endoscopy Indications:              Dysphagia Providers:                Lupita CHARLENA Commander, MD, Ozell Pouch, [place], Technician Referring MD:              Medicines:                Monitored Anesthesia Care Complications:            No immediate complications. Estimated Blood Loss:     Estimated blood loss was minimal. Procedure:                Pre-Anesthesia Assessment:                           - Prior to the procedure, a History and Physical                            was performed, and patient medications and                            allergies were reviewed. The patient's tolerance of                            previous anesthesia was also reviewed. The risks                            and benefits of the procedure and the sedation                            options and risks were discussed with the patient.                            All questions were answered, and informed consent                            was obtained. Prior Anticoagulants: The patient                            last took Eliquis  (apixaban ) 2 days prior to the                            procedure. ASA Grade Assessment: III - A patient                            with severe systemic disease. After reviewing the  risks and benefits, the patient was deemed in                            satisfactory condition to undergo the procedure.                           After obtaining informed consent, the endoscope was                            passed under direct vision. Throughout the                            procedure, the patient's blood pressure, pulse, and                            oxygen  saturations were monitored continuously. The                            [id number]-[id number] ([id number] Olympus endoscope was introduced                            through the mouth, and advanced to the second part                            of duodenum. The upper GI endoscopy was                            accomplished without difficulty. The patient                            tolerated the procedure well. Scope In: Scope Out: Findings:      One benign-appearing, intrinsic moderate (circumferential scarring or       stenosis; an endoscope may pass) stenosis was found at the       gastroesophageal junction. The stenosis was traversed. A TTS dilator was       passed through the scope. Dilation with a 15-16.5-18 mm balloon dilator       was performed to 18 mm. The dilation site was examined and showed       moderate mucosal disruption. Estimated blood loss was minimal.      A single 3 mm sessile polyp was found in the cardia. The polyp was       removed with a cold biopsy forceps. Resection and retrieval were       complete. Verification of patient identification for the specimen was       done. Estimated blood loss was minimal.      The exam was otherwise without abnormality.      The cardia and gastric fundus were otherwise normal on retroflexion. Impression:               - Benign-appearing esophageal stenosis. Dilated.                           - A single gastric polyp. Resected and retrieved.                           -  The examination was otherwise normal. Moderate Sedation:      Not Applicable - Patient had care per Anesthesia. Recommendation:           - Return patient to hospital ward for ongoing care.                           - Clear liquid diet.                           - daily oral PPI                           - can advance diet tomorrow after fecal impaction                            succesfully treated Procedure Code(s):        --- Professional ---                           [phone],  Esophagogastroduodenoscopy, flexible,                            transoral; with transendoscopic balloon dilation of                            esophagus (less than 30 mm diameter)                           [id number], 59, Esophagogastroduodenoscopy, flexible,                            transoral; with biopsy, single or multiple Diagnosis Code(s):        --- Professional ---                           K22.2, Esophageal obstruction                           K31.7, Polyp of stomach and duodenum                           R13.10, Dysphagia, unspecified CPT copyright 2022 [employer]. All rights reserved. The codes documented in this report are preliminary and upon coder review may  be revised to meet current compliance requirements. [name], MD 11/15/2023 3:56:38 PM This report has been signed electronically. Number of Addenda: 0

## 2023-11-15 NOTE — Progress Notes (Addendum)
 [facility] Progress Note  CC: Diarrhea and Crohn's disease      Subjective: Patient ambulated anophthalmia earlier this morning and is currently sitting up in the chair.  No abdominal pain. His RN reported he passed multiple loose stools after SMOG enema given yesterday. He endorses having mild rectal soreness, no significant rectal pain.  He is tolerating clear liquids and swallowing pills without any difficulty.  I called the patient's daughter, [name], and provided further update today. Patient wanted [name]'s input regarding his decision to purse an EGD during this hospitalization due to intermittent dysphagia. [name] advised her father to purse EGD during this hospitalization if recommended by [name].   His caretaker, Tylene, is a the bedside.    Objective:  Vital signs in last 24 hours: Temp:  [97.5 F (36.4 C)-98.5 F (36.9 C)] 98.5 F (36.9 C) (11/05 0539) Pulse Rate:  [60-72] 65 (11/05 0539) Resp:  [18-19] 18 (11/05 0539) BP: (128-134)/(65-76) 132/71 (11/05 0539) SpO2:  [92 %-97 %] 97 % (11/05 0539) Last BM Date : 11/14/23 General: Alert 89 year old male in NAD. Hard of hearing. Sitting up in the chair.  Heart: Regular rate and rhythm, no murmurs. Pulm: Breath sounds clear bilaterally air. Abdomen: Soft, nontender.  Minimal distention.  Positive bowel sounds all 4 quadrants.  Right of midline scar intact. Rectum: No external hemorrhoids.  Rectal vault with a large amount of soft solid mobile stool in the rectal vault. I broke up the stool ball in a few smaller pieces then stopped per the patient's request due to associated rectal pain.  Extremities: No lower extremity edema. Neurologic:  Alert and oriented x 4.  Speech is clear.  Moves all extremities equally. Psych:  Alert and cooperative. Normal mood and affect.  Intake/Output from previous day: 11/04 0701 - 11/05 0700 In: 1200 [P.O.:1200] Out: 1150 [Urine:550; Stool:600] Intake/Output this  shift: No intake/output data recorded.  Lab Results: Recent Labs    11/13/23 1037 11/13/23 1040 11/14/23 0724 11/15/23 0416  WBC 7.1  --  4.0 4.6  HGB 10.0* 9.9* 8.9* 8.2*  HCT 30.2* 29.0* 26.9* 25.2*  PLT 149*  --  128* 107*   BMET Recent Labs    11/13/23 1037 11/13/23 1040 11/14/23 0724 11/15/23 0416  NA 124* 126* 135 130*  K 3.6 3.8 3.5 4.2  CL 92* 92* 106 103  CO2 23  --  22 22  GLUCOSE 100* 100* 87 86  BUN 9 8 5* <5*  CREATININE 0.81 1.00 0.79 0.85  CALCIUM  8.9  --  8.1* 8.1*   LFT Recent Labs    11/14/23 0724  PROT 5.5*  ALBUMIN 3.1*  AST 16  ALT 9  ALKPHOS 57  BILITOT 0.6    CT CHEST ABDOMEN PELVIS W CONTRAST Result Date: 11/13/2023 CLINICAL DATA:  Abdominal pain, nausea, vomiting, diarrhea for 3 days, patient feels like eggs stuck in throat EXAM: CT CHEST, ABDOMEN, AND PELVIS WITH CONTRAST TECHNIQUE: Multidetector CT imaging of the chest, abdomen and pelvis was performed following the standard protocol during bolus administration of intravenous contrast. RADIATION DOSE REDUCTION: This exam was performed according to the departmental dose-optimization program which includes automated exposure control, adjustment of the mA and/or kV according to patient size and/or use of iterative reconstruction technique. CONTRAST:  100mL OMNIPAQUE  IOHEXOL  300 MG/ML  SOLN COMPARISON:  None Available. FINDINGS: CT CHEST FINDINGS Cardiovascular: Scattered aortic atherosclerosis. Normal heart size. Left coronary artery calcifications. No pericardial effusion. Mediastinum/Nodes: No enlarged mediastinal,  hilar, or axillary lymph nodes. Esophagus is fluid and debris-filled to the level of the thoracic inlet without visible mass or obstruction distally. Thyroid  gland and trachea demonstrate no significant findings. Lungs/Pleura: Mild centrilobular and paraseptal emphysema. Mild pulmonary fibrosis with a slight apical to basal gradient, featuring irregular ground-glass and nodularity in  the upper lobes (series 6, image 45), with some evidence of subpleural sparing, particularly in the right upper lobe, with dependent bibasilar subpleural bronchiolectasis. No pleural effusion or pneumothorax. Musculoskeletal: No chest wall abnormality. No acute osseous findings. CT ABDOMEN PELVIS FINDINGS Hepatobiliary: No solid liver abnormality is seen. Distended gallbladder. No gallstones, gallbladder wall thickening, or biliary dilatation. Pancreas: Unremarkable. No pancreatic ductal dilatation or surrounding inflammatory changes. Spleen: Normal in size without significant abnormality. Adrenals/Urinary Tract: Adrenal glands are unremarkable. Kidneys are normal, without renal calculi, solid lesion, or hydronephrosis. Bladder is unremarkable. Stomach/Bowel: Stomach is within normal limits. Status post terminal ileocolectomy and ileocolic reanastomosis. Long segment wall thickening, mucosal hyperenhancement, and narrowing of the tethered appearing remnant distal ileum, affecting a segment at least 40 cm in length (series 8, image 86). Additional circumferential wall thickening of the low rectum (series 2, image 128). Moderate burden of stool balls in the distal colon and rectum, with fluid in the proximal colon. Vascular/Lymphatic: Aortic atherosclerosis. No enlarged abdominal or pelvic lymph nodes. Reproductive: Prostatomegaly with median lobe hypertrophy. Other: No abdominal wall hernia or abnormality. No ascites. Musculoskeletal: No acute osseous findings. IMPRESSION: 1. Status post terminal ileocolectomy and ileocolic reanastomosis. Long segment wall thickening, mucosal hyperenhancement, and narrowing of the tethered appearing remnant distal ileum, affecting a segment at least 40 cm in length. Findings are consistent with active inflammatory bowel disease. No evidence of obstruction. 2. Additional circumferential wall thickening of the low rectum, consistent with proctitis. 3. Esophagus is fluid and  debris-filled to the level of the thoracic inlet without visible mass or obstruction distally. Consider endoscopy to further evaluate for stricture, achalasia, or occult mass. 4. Mild pulmonary fibrosis with a slight apical to basal gradient, featuring irregular ground-glass and nodularity in the upper lobes, with some evidence of subpleural sparing, particularly in the right upper lobe, with dependent bibasilar subpleural bronchiolectasis. 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 5. Emphysema. 6. Coronary artery disease. Aortic Atherosclerosis (ICD10-I70.0) and Emphysema (ICD10-J43.9). Electronically Signed   By: Marolyn JONETTA Jaksch M.D.   On: 11/13/2023 11:50   Patient  Profile: Michael [name] is a 89 y.o. male with a past medical history of glaucoma, atrial fibrillation on Eliquis , hepatic steatosis, colon polyps and Crohn's disease initially diagnosed in 1966 with fibrostenosing ileal Crohn's disease s/p surgery in 1968 and 2001 for small bowel resections. Previously treated with chronic Prednisone  and subsequently developed adrenal insufficiency and avascular necrosis of the hips. He was last seen in our outpatient GI clinic by [name] 11/16/2022 and no further colonoscopies were recommended due to age and intermittent treatment with Budesonide  PRN  for Crohn's flares was recommended, patient previously declined biologic therapy. Admitted 11/13/2023 with generalized weakness and diarrhea x 3 days.     Assessment / Plan:  89 year old male with Crohn's disease initially diagnosed in 1966 with fibrostenosing ileal Crohn's disease s/p surgery in 1968 and 2001 for small bowel resections presents with generalized weakness and nonbloody diarrhea x 3 to 4 days. CTAP with contrast consistent with s/p terminal ileocolectomy and ileocolic reanastomosis, long segment wall thickening, mucosal hyperenhancement, and narrowing of the tethered appearing remnant distal ileum, affecting a segment at  least  40 cm in length consistent with active inflammatory bowel disease, no evidence of obstruction and circumferential wall thickening of the low rectum suggestive of proctitis. Large soft stool ball in rectum. C. Diff toxin/antigen ordered, specimen not yet collected.  Suspect diarrhea secondary to constipation with overflow. He takes Imodium  1 to 2 tabs daily at home for the past 6 months. CRP 1.8. Sed rate 16.  Low suspicion for C. Diff. Received a SMOG enema yesterday evening, resulted in passing a moderate amount of watery stool per the nursing staff. Repeat rectal exam 11/4 confirmed persistent fecal impaction. Disimpaction was not performed, patient endorsed having active rectal pain on exam and he felt he could not tolerate disimpaction at that time. Received another SMOG enema 11/4, passed only loose stool. Repeat rectal exam today showed persistent stool filled rectum.  - NPO - IV fluids per the hospitalist  - Hold Imodium   - Dulcolax suppository x 1 now, RN to assist patient to [facility] - Flex sig/fecal disimpaction at time of EGD, benefits and risks discussed with patient and daughter (via speaker phone) including risk with sedation, risk of bleeding, perforation and infection  - OOB to chair and ambulate with assistance  - Await further recommendations per [name]   Chronic iron deficiency anemia, likely secondary to Crohn's disease. No overt GI bleeding. Hg 10 -> 9.9 -> Hg 8.9 -> 8.2. Iron 27. Saturation ratios 13. TIBC 207. Ferritin 160.  - Transfuse for Hg level < 8 - CBC in am    Thrombocytopenia, unclear etiology. PLT 149 -> 128 -> 107.    History of GERD and infrequent dysphagia. Patient had an episode of dysphagia this morning, felt like egg was stuck in his throat/upper esophagus. Chest CT showed the esophagus is fluid and debris-filled to the level of the thoracic inlet without visible mass or obstruction distally.  Patient was seen by speech pathologist earlier today, exam was  limited as patient was assessed to be confused and was hard to redirect.  He coughed on his first sip of water  without overt signs of aspiration. Speech pathologist suspects esophageal dysphagia, potentially at risk for aspiration. - NPO - EGD today, benefits and risks discussed with patient and daughter (via speaker phone) including risk with sedation, risk of bleeding, perforation and infection  - IV fluids per the hospitalist  - Pantoprazole  40mg  IV bid    Colonoscopy 03/2020 showed Crohn's disease with mild active ileitis, patent end-to-end ileocolonic anastomosis with nonspecific inflammatory changes without granulomas or dysplasia.   Hyponatremia. NA+ 130.  - Management per the hospitalist   History of atrial fibrillation on Eliquis . Lasts dose of Eliquis  was 8am on 11/3. - Continue to hold Eliquis    History of colon polyps colonoscopy 03/2020 identified a polypoid lesion in the ascending colon, path report showed fragments of polypoid colonic mucosa with low-grade dysplasia with a tubular architecture and an inflammatory polyp. -No further no further surveillance colonoscopies recommended due to age   Chest CT showed evidence of mild pulmonary fibrosis and emphysema   CAD per chest CT   Recent hospital admission 10/18 - 10/30/2023 with acute change in mental status with aphasia. CT head, CT angio head and neck and MRI brain negative for acute CVA or other intracranial abnormality.   Normal EEG was normal.  Treated with Keppra for suspected seizure activity.        LOS: 0 days   Elida CHRISTELLA Shawl  11/15/2023, 10:29 AM    [facility] Attending  I have taken an interval history, reviewed the chart and examined the patient.  The majority the medical decision making was performed by me.  I agree with the Advanced Practitioner's note, impression and recommendations with the following additions:  We have decided to perform EGD and sigmoidoscopy today.  Decision to perform  endoscopic evaluations were made today. He has persistent problems with rectal pain and diarrhea and fecal impaction so investigation under the influence of sedation with hopes for relieving impaction and understanding this better or indicated.  EGD to evaluate dysphagia and fluid-filled esophagus question esophageal stricture.  Moderate complexity  Lupita CHARLENA Commander, MD, [facility] See TRACEY on call - gastroenterology for best contact person 11/15/2023 3:02 PM

## 2023-11-15 NOTE — Anesthesia Postprocedure Evaluation (Signed)
 Anesthesia Post Note  Patient: Michael [name]  Procedure(s) Performed: EGD (ESOPHAGOGASTRODUODENOSCOPY) SIGMOIDOSCOPY, FLEXIBLE Balloon dilation wire-guided     Patient location during evaluation: PACU Anesthesia Type: General Level of consciousness: awake and alert Pain management: pain level controlled Vital Signs Assessment: post-procedure vital signs reviewed and stable Respiratory status: spontaneous breathing, nonlabored ventilation and respiratory function stable Cardiovascular status: blood pressure returned to baseline and stable Postop Assessment: no apparent nausea or vomiting Anesthetic complications: no   No notable events documented.  Last Vitals:  Vitals:   11/15/23 1405 11/15/23 1556  BP: (!) 160/66 138/62  Pulse: 61 64  Resp: 19 15  Temp: (!) 36.3 C   SpO2: 100% 98%    Last Pain:  Vitals:   11/15/23 1405  TempSrc: Temporal  PainSc: 0-No pain                 Almarie CHRISTELLA Marchi

## 2023-11-15 NOTE — Anesthesia Procedure Notes (Signed)
 Procedure Name: Intubation Date/Time: 11/15/2023 3:24 PM  Performed by: Eilleen Fellows, CRNAPre-anesthesia Checklist: Patient identified, Emergency Drugs available, Suction available and Patient being monitored Patient Re-evaluated:Patient Re-evaluated prior to induction Oxygen Delivery Method: Circle system utilized Preoxygenation: Pre-oxygenation with 100% oxygen Induction Type: IV induction and Rapid sequence Laryngoscope Size: Mac and 3 Grade View: Grade I Tube type: Oral Tube size: 7.5 mm Number of attempts: 1 Airway Equipment and Method: Stylet and Oral airway Placement Confirmation: ETT inserted through vocal cords under direct vision, positive ETCO2 and breath sounds checked- equal and bilateral Secured at: 22 cm Tube secured with: Tape Dental Injury: Teeth and Oropharynx as per pre-operative assessment

## 2023-11-15 NOTE — Op Note (Signed)
 [facility] Patient Name: Michael [name] Procedure Date: 11/15/2023 MRN: [id number] Attending MD: [name] , MD, [id number] Date of Birth: November 05, 1932 CSN: [id number] Age: 89 Admit Type: Inpatient Procedure:                Flexible Sigmoidoscopy Indications:              Fecal impaction Providers:                Lupita CHARLENA Commander, MD, Ozell Pouch, [place], Technician Referring MD:              Medicines:                General Anesthesia Complications:            No immediate complications. Estimated Blood Loss:     Estimated blood loss: none. Procedure:                Pre-Anesthesia Assessment:                           - Prior to the procedure, a History and Physical                            was performed, and patient medications and                            allergies were reviewed. The patient's tolerance of                            previous anesthesia was also reviewed. The risks                            and benefits of the procedure and the sedation                            options and risks were discussed with the patient.                            All questions were answered, and informed consent                            was obtained. Prior Anticoagulants: The patient                            last took Eliquis  (apixaban ) 2 days prior to the                            procedure. ASA Grade Assessment: III - A patient                            with severe systemic disease. After reviewing the  risks and benefits, the patient was deemed in                            satisfactory condition to undergo the procedure.                           After obtaining informed consent, the scope was                            passed under direct vision. The [id number]-[id number]                            ([id number] Olympus colonoscope was introduced                            through the anus and  advanced to the the sigmoid                            colon. The flexible sigmoidoscopy was accomplished                            without difficulty. The patient tolerated the                            procedure well. The quality of the bowel                            preparation was poor. No prep was given. Scope In: 3:42:28 PM Scope Out: 3:44:07 PM Total Procedure Duration: 0 hours 1 minute 39 seconds  Findings:      The digital rectal exam findings include soft fecal impaction.      A large amount of stool was found in the rectum and in the recto-sigmoid       colon, interfering with visualization. Manual disimpaction and       irrigation performed but unsuccessful - stool is too soft Impression:               - Preparation of the colon was poor. (unprepped)                           - Soft fecal impaction found on digital rectal exam.                           - Stool in the rectum and in the recto-sigmoid                            colon.                           - No specimens collected. Moderate Sedation:      Not Applicable - Patient had care per Anesthesia. Recommendation:           - Return patient to hospital ward for ongoing care.                           -  Clear liquid diet.                           - SMOG enemas and manual disimpaction will not work                            - stool is soft and cannot remove adequate amounts                            yet too firm for irrigation to liquify.                           Will do Miralax  prep purge tonight and repeat that                            or other oral prep as needed. Going to have to                            relieve impaction from above.                           I called and updated daughter [name] Delude. Procedure Code(s):        --- Professional ---                           [phone], Sigmoidoscopy, flexible; diagnostic,                            including collection of specimen(s) by brushing or                             washing, when performed (separate procedure) Diagnosis Code(s):        --- Professional ---                           K56.41, Fecal impaction CPT copyright 2022 [employer]. All rights reserved. The codes documented in this report are preliminary and upon coder review may  be revised to meet current compliance requirements. [name], MD 11/15/2023 4:06:07 PM This report has been signed electronically. Number of Addenda: 0

## 2023-11-15 NOTE — Transfer of Care (Signed)
 Immediate Anesthesia Transfer of Care Note  Patient: Michael [name]  Procedure(s) Performed: EGD (ESOPHAGOGASTRODUODENOSCOPY) SIGMOIDOSCOPY, FLEXIBLE Balloon dilation wire-guided  Patient Location: PACU  Anesthesia Type:General  Level of Consciousness: awake, alert , oriented, and patient cooperative  Airway & Oxygen Therapy: Patient Spontanous Breathing and Patient connected to face mask oxygen  Post-op Assessment: Report given to RN and Post -op Vital signs reviewed and stable  Post vital signs: Reviewed and stable  Last Vitals:  Vitals Value Taken Time  BP 138/62 11/15/23 15:56  Temp    Pulse 68 11/15/23 15:57  Resp 18 11/15/23 15:57  SpO2 98 % 11/15/23 15:57  Vitals shown include unfiled device data.  Last Pain:  Vitals:   11/15/23 1405  TempSrc: Temporal  PainSc: 0-No pain      Patients Stated Pain Goal: 0 (11/13/23 2125)  Complications: No notable events documented.

## 2023-11-15 NOTE — Plan of Care (Signed)
  Problem: Education: Goal: Knowledge of General Education information will improve Description: Including pain rating scale, medication(s)/side effects and non-pharmacologic comfort measures Outcome: Progressing   Problem: Health Behavior/Discharge Planning: Goal: Ability to manage health-related needs will improve Outcome: Progressing   Problem: Clinical Measurements: Goal: Ability to maintain clinical measurements within normal limits will improve Outcome: Progressing Goal: Will remain free from infection Outcome: Progressing Goal: Diagnostic test results will improve Outcome: Progressing Goal: Respiratory complications will improve Outcome: Progressing Goal: Cardiovascular complication will be avoided Outcome: Progressing   Problem: Activity: Goal: Risk for activity intolerance will decrease Outcome: Progressing   Problem: Nutrition: Goal: Adequate nutrition will be maintained Outcome: Progressing   Problem: Coping: Goal: Level of anxiety will decrease Outcome: Progressing   Problem: Elimination: Goal: Will not experience complications related to bowel motility Outcome: Progressing Goal: Will not experience complications related to urinary retention Outcome: Progressing   Problem: Pain Managment: Goal: General experience of comfort will improve and/or be controlled Outcome: Progressing   Problem: Safety: Goal: Ability to remain free from injury will improve Outcome: Progressing   Problem: Skin Integrity: Goal: Risk for impaired skin integrity will decrease Outcome: Progressing  Pt has several large bowel movements and he feels so much better.

## 2023-11-15 NOTE — Progress Notes (Signed)
 Mobility Specialist - Progress Note   11/15/23 1000  Mobility  Activity Ambulated with assistance  Level of Assistance Contact guard assist, steadying assist  Assistive Device Front wheel walker  Distance Ambulated (ft) 120 ft  Range of Motion/Exercises Active  Activity Response Tolerated well  Mobility Referral Yes  Mobility visit 1 Mobility  Mobility Specialist Start Time (ACUTE ONLY) 0902  Mobility Specialist Stop Time (ACUTE ONLY) 0915  Mobility Specialist Time Calculation (min) (ACUTE ONLY) 13 min   Received in bed and agreed to mobility, had no issues throughout session, returned to chair with all needs met.   Cyndee Ada Mobility Specialist

## 2023-11-15 NOTE — Plan of Care (Signed)

## 2023-11-15 NOTE — Progress Notes (Signed)
 PROGRESS NOTE    Michael [name]  FMW:[id number] DOB: [birth year] DOA: 11/13/2023 PCP: [name], MD    Chief Complaint  Patient presents with   Abdominal Pain   Emesis    Brief Narrative:  Brief Narrative: HPI per Dr. Celinda 89 y.o. male with medical history significant of adrenal insufficiency, unspecified anemia, paroxysmal atrial fibrillation, history of DVT, avascular necrosis, clavicle fracture, colon polyps, COVID-19, Crohn's disease, glaucoma, pseudophakia of both sides, vitreous prolapse of the right eye, hypomagnesemia, hypokalemia, recurrent bilateral inguinal hernia, male hypogonadism, osteopenia, retinal ischemia, B12 deficiency, vitamin D  deficiency, history of pneumonia, thrombocytopenia, history of encephalitis, history of seizure-like activity, small bowel obstruction, history of TIA, hypertension, hyperlipidemia, aortic atherosclerosis, mild cognitive impairment, overactive bladder, bilateral hearing loss who came to the emergency department due to abdominal pain, nausea, vomiting and diarrhea for the past 3 days.  No sick contacts or travel history.  He has been constipated for the last 3 days despite using magnesium  hydroxide.  No melena or hematochezia.  He denied fever, chills, rhinorrhea, sore throat, wheezing or hemoptysis.  No chest pain, palpitations, diaphoresis, PND, orthopnea or pitting edema of the lower extremities. No flank pain, dysuria, frequency or hematuria.  No polyuria, polydipsia, polyphagia or blurred vision.    Lab work: Urinalysis was normal.  CBC showed a white count of 7.1, hemoglobin 10.0 g/dL and platelets 850.  Coronavirus PCR was negative.  CMP showed a glucose of 100 mg deciliter, sodium 124 and chloride 92 mmol/L, the rest of the electrolytes, hepatic and renal functions were normal.  Magnesium  was 1.7 and phosphorus 2.4 mg/dL.  CT Status post terminal ileocolectomy and ileocolic reanastomosis. Long segment wall thickening, mucosal  hyperenhancement, and narrowing of the tethered appearing remnant distal ileum, affecting a segment at least 40 cm in length. Findings are consistent with active inflammatory bowel disease. No evidence of obstruction. Additional circumferential wall thickening of the low rectum, consistent with proctitis. Esophagus is fluid and debris-filled to the level of the thoracic inlet without visible mass or obstruction distally. Consider endoscopy to further evaluate for stricture, achalasia, or occult mass. Mild pulmonary fibrosis with a slight apical to basal gradient, featuring irregular ground-glass and nodularity in the upper lobes, with some evidence of subpleural sparing, particularly in the right upper lobe, with dependent bibasilar subpleural bronchiolectasis. Findings are suggestive of an alternative diagnosis to UIP.Emphysema.   Assessment & Plan:   Principal Problem:   Hyponatremia Active Problems:   PAF (paroxysmal atrial fibrillation) (HCC)   Crohn's disease (HCC)   Glaucoma   Aortic atherosclerosis   Seizure-like activity (HCC)   Fecal impaction of rectum (HCC)  #1 hypovolemic hyponatremia  - Likely secondary to prerenal azotemia in the setting of decreased oral intake in the setting of desmopressin  and Zoloft. - Noted to have started on Zoloft a week prior to admission as he was going through grief and depression as his wife currently on hospice. - Per Dr. Alvia daughter stated patient had been on DDAVP  for over a year for nocturnal bedwetting.  Family does not feel DDAVP  is helping with the nocturnal bedwetting and as such DDAVP  discontinued. - Patient with some improvement with hydration however sodium level fluctuating. - Currently receiving Zoloft. - Sodium at 130 from 135 from 126 from 124 on admission. - Placed on IV fluids of LR at 100 cc an hour for the next 24 hours and follow. - If sodium levels do not improve and trend back down we will discontinue Zoloft. -  Supportive care.  2.  Fecal impaction of the rectum with overflow incontinence - Patient noted to have some fecal impaction of the rectum with overflow incontinence which patient presented with as diarrhea. - Patient given smog enema x 2 per gi with liquid stools. - Patient being followed by GI and patient for flexible sigmoidoscopy and EGD today. -Patient underwent flexible sigmoidoscopy which showed a poor preparation of the colon, some fecal impaction found on digital rectal exam, stool in rectum and in rectosigmoid colon, manual disimpaction and irrigation performed but unsuccessful. -GI recommending MiraLAX  prep purge tonight and may repeat. -Per GI.  3.??  Achalasia versus occult mass versus stricture -Patient being followed by GI patient underwent upper endoscopy 11/15/2023 which showed benign-appearing esophageal stenosis status post dilatation, single gastric polyp status post resection and retrieval. - Per GI.  4.  Crohn's disease -Per GI.  5.  Paroxysmal A-fib -Currently rate controlled. - Eliquis  on hold for GI procedures today. - GI to advise when Eliquis  may be resumed.  6.  Aortic atherosclerosis -Patient not on statin therapy prior to admission. - Outpatient follow-up.  7.  History of seizure-like activity -Continue Keppra 750 mg twice daily. - Pyridoxine resumed to help with irritable side effects from Keppra.  8.  Glaucoma -Continue latanoprost  and netarsudil  drops. - Patient follow-up with ophthalmology.   DVT prophylaxis: SCDs Code Status: Full Family Communication: Updated patient and caretaker at bedside. Disposition: TBD  Status is: Observation The patient remains OBS appropriate and will d/c before 2 midnights.   Consultants:  Gastroenterology: [name] 11/13/2023  Procedures:  CT chest abdomen pelvis 11/13/2023 Flexible sigmoidoscopy 11/15/2023 EGD 11/15/2023  Antimicrobials: Anti-infectives (From admission, onward)    Start     Dose/Rate  Route Frequency Ordered Stop   11/14/23 1430  cefTRIAXone  (ROCEPHIN ) 2 g in sodium chloride  0.9 % 100 mL IVPB  Status:  Discontinued        2 g 200 mL/hr over 30 Minutes Intravenous Every 24 hours 11/13/23 1423 11/13/23 1913   11/13/23 2200  metroNIDAZOLE  (FLAGYL ) IVPB 500 mg  Status:  Discontinued        500 mg 100 mL/hr over 60 Minutes Intravenous Every 12 hours 11/13/23 1423 11/13/23 1913   11/13/23 1430  cefTRIAXone  (ROCEPHIN ) 1 g in sodium chloride  0.9 % 100 mL IVPB        1 g 200 mL/hr over 30 Minutes Intravenous  Once 11/13/23 1423 11/13/23 1953   11/13/23 1315  cefTRIAXone  (ROCEPHIN ) 1 g in sodium chloride  0.9 % 100 mL IVPB        1 g 200 mL/hr over 30 Minutes Intravenous  Once 11/13/23 1304 11/13/23 1412   11/13/23 1315  metroNIDAZOLE  (FLAGYL ) IVPB 500 mg        500 mg 100 mL/hr over 60 Minutes Intravenous  Once 11/13/23 1304 11/13/23 1512         Subjective: Patient laying in bed.  States still had lots of liquid stool yesterday after smog enema with small chunks of solid stool per caretaker at bedside but not as significant.  Per caretaker patient with Dulcolax suppository this morning with a small stool ball.  Patient denies any chest pain or shortness of breath.  No significant abdominal pain.  Tolerated clear liquids today.  Objective: Vitals:   11/14/23 0505 11/14/23 1406 11/14/23 2113 11/15/23 0539  BP: 120/69 128/65 134/76 132/71  Pulse: 65 60 72 65  Resp: 16 18 19 18   Temp: 97.9 F (36.6 C) (!) 97.5  F (36.4 C) 98.1 F (36.7 C) 98.5 F (36.9 C)  TempSrc: Oral Oral Oral Oral  SpO2: 98% 92% 97% 97%  Weight:      Height:        Intake/Output Summary (Last 24 hours) at 11/15/2023 1216 Last data filed at 11/15/2023 0500 Gross per 24 hour  Intake 960 ml  Output 1150 ml  Net -190 ml   Filed Weights   11/13/23 1934  Weight: 69.9 kg    Examination:  General exam: Appears calm and comfortable.  Dry mucous membranes. Respiratory system: Clear to  auscultation.  No wheezes, no crackles, no rhonchi.  Respiratory effort normal. Cardiovascular system: S1 & S2 heard, RRR. No JVD, murmurs, rubs, gallops or clicks. No pedal edema. Gastrointestinal system: Abdomen is nondistended, soft and nontender. No organomegaly or masses felt. Normal bowel sounds heard. Central nervous system: Alert and oriented. No focal neurological deficits. Extremities: Symmetric 5 x 5 power. Skin: No rashes, lesions or ulcers Psychiatry: Judgement and insight appear normal. Mood & affect appropriate.     Data Reviewed: I have personally reviewed following labs and imaging studies  CBC: Recent Labs  Lab 11/13/23 1037 11/13/23 1040 11/14/23 0724 11/15/23 0416  WBC 7.1  --  4.0 4.6  NEUTROABS 3.9  --   --   --   HGB 10.0* 9.9* 8.9* 8.2*  HCT 30.2* 29.0* 26.9* 25.2*  MCV 84.8  --  87.9 88.1  PLT 149*  --  128* 107*    Basic Metabolic Panel: Recent Labs  Lab 11/13/23 1037 11/13/23 1040 11/13/23 1519 11/14/23 0724 11/15/23 0416  NA 124* 126*  --  135 130*  K 3.6 3.8  --  3.5 4.2  CL 92* 92*  --  106 103  CO2 23  --   --  22 22  GLUCOSE 100* 100*  --  87 86  BUN 9 8  --  5* <5*  CREATININE 0.81 1.00  --  0.79 0.85  CALCIUM  8.9  --   --  8.1* 8.1*  MG  --   --  1.7  --   --   PHOS  --   --  2.4*  --   --     GFR: Estimated Creatinine Clearance: 58.3 mL/min (by C-G formula based on SCr of 0.85 mg/dL).  Liver Function Tests: Recent Labs  Lab 11/13/23 1037 11/14/23 0724  AST 26 16  ALT 14 9  ALKPHOS 94 57  BILITOT 1.0 0.6  PROT 6.7 5.5*  ALBUMIN 3.9 3.1*    CBG: No results for input(s): GLUCAP in the last 168 hours.   Recent Results (from the past 240 hours)  SARS Coronavirus 2 by RT PCR (hospital order, performed in [facility] hospital lab) *cepheid single result test* Anterior Nasal Swab     Status: None   Collection Time: 11/13/23 10:35 AM   Specimen: Anterior Nasal Swab  Result Value Ref Range Status   SARS Coronavirus 2  by RT PCR NEGATIVE NEGATIVE Final    Comment: (NOTE) SARS-CoV-2 target nucleic acids are NOT DETECTED.  The SARS-CoV-2 RNA is generally detectable in upper and lower respiratory specimens during the acute phase of infection. The lowest concentration of SARS-CoV-2 viral copies this assay can detect is 250 copies / mL. A negative result does not preclude SARS-CoV-2 infection and should not be used as the sole basis for treatment or other patient management decisions.  A negative result may occur with improper specimen collection / handling,  submission of specimen other than nasopharyngeal swab, presence of viral mutation(s) within the areas targeted by this assay, and inadequate number of viral copies (<250 copies / mL). A negative result must be combined with clinical observations, patient history, and epidemiological information.  Fact Sheet for Patients:   roadlaptop.co.za  Fact Sheet for Healthcare Providers: http://kim-miller.com/  This test is not yet approved or  cleared by the [place]  FDA and has been authorized for detection and/or diagnosis of SARS-CoV-2 by FDA under an Emergency Use Authorization (EUA).  This EUA will remain in effect (meaning this test can be used) for the duration of the COVID-19 declaration under Section 564(b)(1) of the Act, 21 U.S.C. section 360bbb-3(b)(1), unless the authorization is terminated or revoked sooner.  Performed at [facility], [address], [place], [place] [address]          Radiology Studies: No results found.      Scheduled Meds:  brimonidine   1 drop Left Eye BID   [START ON 11/16/2023] cholecalciferol   2,000 Units Oral Q breakfast   dorzolamide -timolol   1 drop Left Eye BID   latanoprost   1 drop Left Eye QHS   levETIRAcetam  750 mg Oral BID   liver oil-zinc  oxide   Topical TID   metoprolol  succinate  12.5 mg Oral Daily   Netarsudil  Dimesylate  1  drop Left Eye QPM   pantoprazole  (PROTONIX ) IV  40 mg Intravenous Q12H   pyridOXINE  50 mg Oral Daily   sertraline  25 mg Oral Daily   Continuous Infusions:   LOS: 0 days    Time spent: 40 minutes    Toribio Hummer, MD [facility]   To contact the attending provider between 7A-7P or the covering provider during after hours 7P-7A, please log into the web site [email or web address] and access using universal [place] password for that web site. If you do not have the password, please call the hospital operator.  11/15/2023, 12:16 PM

## 2023-11-15 NOTE — Anesthesia Preprocedure Evaluation (Addendum)
 Anesthesia Evaluation  Patient identified by MRN, date of birth, ID band Patient awake    Reviewed: Allergy & Precautions, NPO status , Patient's Chart, lab work & pertinent test results  History of Anesthesia Complications Negative for: history of anesthetic complications  Airway Mallampati: I  TM Distance: >3 FB Neck ROM: Full    Dental  (+) Teeth Intact, Partial Upper, Dental Advisory Given   Pulmonary neg sleep apnea, neg COPD, neg recent URI, former smoker   breath sounds clear to auscultation       Cardiovascular hypertension, + DVT  + dysrhythmias Atrial Fibrillation  Rhythm:Regular Rate:Normal  TTE (2022): 1. Left ventricular ejection fraction, by estimation, is 55 to 60%. Left  ventricular ejection fraction by PLAX is 57 %. The left ventricle has  normal function. The left ventricle demonstrates regional wall motion  abnormalities (see scoring diagram/findings for description). Left ventricular diastolic parameters are consistent with Grade I diastolic dysfunction (impaired relaxation).   2. Right ventricular systolic function is normal. The right ventricular  size is normal. There is normal pulmonary artery systolic pressure.   3. The mitral valve is normal in structure. Trivial mitral valve  regurgitation. No evidence of mitral stenosis.   4. The aortic valve is tricuspid. Aortic valve regurgitation is trivial.  No aortic stenosis is present.   5. Aortic dilatation noted. There is borderline dilatation of the aortic  root, measuring 36 mm.   6. The inferior vena cava is normal in size with greater than 50%  respiratory variability, suggesting right atrial pressure of 3 mmHg.     Neuro/Psych Seizures: Seizure-like Activity on Keppra; Negative EEG.  TIA   GI/Hepatic ,GERD  Medicated,,Crohn's disease   Endo/Other  neg diabetes  Adrenal Insufficiency  Renal/GU Renal diseaseChronic Hyponatremia (125 on 11/5)      Musculoskeletal   Abdominal   Peds  Hematology  (+) Blood dyscrasia, anemia   Anesthesia Other Findings   Reproductive/Obstetrics                              Anesthesia Physical Anesthesia Plan  ASA: 3  Anesthesia Plan: General   Post-op Pain Management:    Induction: Intravenous and Rapid sequence  PONV Risk Score and Plan: 1 and Dexamethasone , Treatment may vary due to age or medical condition and Ondansetron   Airway Management Planned: Oral ETT  Additional Equipment:   Intra-op Plan:   Post-operative Plan: Extubation in OR  Informed Consent:      Dental advisory given  Plan Discussed with: CRNA and Surgeon  Anesthesia Plan Comments:          Anesthesia Quick Evaluation

## 2023-11-15 NOTE — Progress Notes (Signed)
 SLP Cancellation Note  Patient Details Name: Michael [name] MRN: [id number] DOB: [birth year]   Cancelled treatment:       Reason Eval/Treat Not Completed: Other (comment);Pain limiting ability to participate (pt now NPO per GI, will continue efforts)  Madelin POUR, MS [facility] SLP Acute Rehab Services Office [phone]  Nicolas Emmie Caldron 11/15/2023, 11:22 AM

## 2023-11-16 ENCOUNTER — Encounter (HOSPITAL_COMMUNITY): Payer: Self-pay | Admitting: Internal Medicine

## 2023-11-16 DIAGNOSIS — H409 Unspecified glaucoma: Secondary | ICD-10-CM

## 2023-11-16 DIAGNOSIS — R9389 Abnormal findings on diagnostic imaging of other specified body structures: Secondary | ICD-10-CM

## 2023-11-16 DIAGNOSIS — K222 Esophageal obstruction: Secondary | ICD-10-CM

## 2023-11-16 DIAGNOSIS — Z79899 Other long term (current) drug therapy: Secondary | ICD-10-CM | POA: Diagnosis not present

## 2023-11-16 DIAGNOSIS — I10 Essential (primary) hypertension: Secondary | ICD-10-CM | POA: Diagnosis present

## 2023-11-16 DIAGNOSIS — Z7983 Long term (current) use of bisphosphonates: Secondary | ICD-10-CM | POA: Diagnosis not present

## 2023-11-16 DIAGNOSIS — Z7901 Long term (current) use of anticoagulants: Secondary | ICD-10-CM | POA: Diagnosis not present

## 2023-11-16 DIAGNOSIS — I2510 Atherosclerotic heart disease of native coronary artery without angina pectoris: Secondary | ICD-10-CM

## 2023-11-16 DIAGNOSIS — I700 Atherosclerosis of aorta: Secondary | ICD-10-CM

## 2023-11-16 DIAGNOSIS — K50018 Crohn's disease of small intestine with other complication: Secondary | ICD-10-CM

## 2023-11-16 DIAGNOSIS — E785 Hyperlipidemia, unspecified: Secondary | ICD-10-CM | POA: Diagnosis present

## 2023-11-16 DIAGNOSIS — E861 Hypovolemia: Secondary | ICD-10-CM | POA: Diagnosis present

## 2023-11-16 DIAGNOSIS — K5641 Fecal impaction: Principal | ICD-10-CM

## 2023-11-16 DIAGNOSIS — K50012 Crohn's disease of small intestine with intestinal obstruction: Secondary | ICD-10-CM

## 2023-11-16 DIAGNOSIS — I480 Paroxysmal atrial fibrillation: Secondary | ICD-10-CM

## 2023-11-16 DIAGNOSIS — R569 Unspecified convulsions: Secondary | ICD-10-CM

## 2023-11-16 DIAGNOSIS — F32A Depression, unspecified: Secondary | ICD-10-CM | POA: Diagnosis present

## 2023-11-16 DIAGNOSIS — E871 Hypo-osmolality and hyponatremia: Secondary | ICD-10-CM

## 2023-11-16 DIAGNOSIS — D509 Iron deficiency anemia, unspecified: Secondary | ICD-10-CM | POA: Diagnosis present

## 2023-11-16 DIAGNOSIS — Z1152 Encounter for screening for COVID-19: Secondary | ICD-10-CM | POA: Diagnosis not present

## 2023-11-16 DIAGNOSIS — K5000 Crohn's disease of small intestine without complications: Secondary | ICD-10-CM | POA: Diagnosis present

## 2023-11-16 DIAGNOSIS — Z860100 Personal history of colon polyps, unspecified: Secondary | ICD-10-CM

## 2023-11-16 DIAGNOSIS — K760 Fatty (change of) liver, not elsewhere classified: Secondary | ICD-10-CM | POA: Diagnosis present

## 2023-11-16 DIAGNOSIS — F419 Anxiety disorder, unspecified: Secondary | ICD-10-CM | POA: Diagnosis present

## 2023-11-16 DIAGNOSIS — K529 Noninfective gastroenteritis and colitis, unspecified: Secondary | ICD-10-CM | POA: Diagnosis present

## 2023-11-16 DIAGNOSIS — E2740 Unspecified adrenocortical insufficiency: Secondary | ICD-10-CM | POA: Diagnosis present

## 2023-11-16 DIAGNOSIS — D696 Thrombocytopenia, unspecified: Secondary | ICD-10-CM | POA: Diagnosis present

## 2023-11-16 DIAGNOSIS — J439 Emphysema, unspecified: Secondary | ICD-10-CM | POA: Diagnosis present

## 2023-11-16 DIAGNOSIS — Z8616 Personal history of COVID-19: Secondary | ICD-10-CM | POA: Diagnosis not present

## 2023-11-16 DIAGNOSIS — Z8249 Family history of ischemic heart disease and other diseases of the circulatory system: Secondary | ICD-10-CM | POA: Diagnosis not present

## 2023-11-16 LAB — CBC WITH DIFFERENTIAL/PLATELET
Abs Immature Granulocytes: 0.2 K/uL — ABNORMAL HIGH (ref 0.00–0.07)
Basophils Absolute: 0 K/uL (ref 0.0–0.1)
Basophils Relative: 1 %
Eosinophils Absolute: 0 K/uL (ref 0.0–0.5)
Eosinophils Relative: 0 %
HCT: 28.5 % — ABNORMAL LOW (ref 39.0–52.0)
Hemoglobin: 9 g/dL — ABNORMAL LOW (ref 13.0–17.0)
Immature Granulocytes: 7 %
Lymphocytes Relative: 24 %
Lymphs Abs: 0.7 K/uL (ref 0.7–4.0)
MCH: 27.6 pg (ref 26.0–34.0)
MCHC: 31.6 g/dL (ref 30.0–36.0)
MCV: 87.4 fL (ref 80.0–100.0)
Monocytes Absolute: 0.1 K/uL (ref 0.1–1.0)
Monocytes Relative: 2 %
Neutro Abs: 2 K/uL (ref 1.7–7.7)
Neutrophils Relative %: 66 %
Platelets: 136 K/uL — ABNORMAL LOW (ref 150–400)
RBC: 3.26 MIL/uL — ABNORMAL LOW (ref 4.22–5.81)
RDW: 19 % — ABNORMAL HIGH (ref 11.5–15.5)
Smear Review: NORMAL
WBC: 3 K/uL — ABNORMAL LOW (ref 4.0–10.5)
nRBC: 0 % (ref 0.0–0.2)

## 2023-11-16 LAB — BASIC METABOLIC PANEL WITH GFR
Anion gap: 8 (ref 5–15)
BUN: <5 mg/dL — ABNORMAL LOW (ref 8–23)
CO2: 21 mmol/L — ABNORMAL LOW (ref 22–32)
Calcium: 9 mg/dL (ref 8.9–10.3)
Chloride: 105 mmol/L (ref 98–111)
Creatinine, Ser: 0.82 mg/dL (ref 0.61–1.24)
GFR, Estimated: >60 mL/min (ref >60)
Glucose, Bld: 151 mg/dL — ABNORMAL HIGH (ref 70–99)
Potassium: 4 mmol/L (ref 3.5–5.1)
Sodium: 134 mmol/L — ABNORMAL LOW (ref 135–145)

## 2023-11-16 LAB — MAGNESIUM: Magnesium: 1.9 mg/dL (ref 1.7–2.4)

## 2023-11-16 MED ORDER — APIXABAN 5 MG PO TABS
5.0000 mg | ORAL_TABLET | Freq: Two times a day (BID) | ORAL | Status: DC
  Administered 2023-11-16 – 2023-11-17 (×3): 5 mg via ORAL
  Filled 2023-11-16 (×3): qty 1

## 2023-11-16 MED ORDER — POLYETHYLENE GLYCOL 3350 17 G PO PACK
17.0000 g | PACK | Freq: Every day | ORAL | Status: DC
  Administered 2023-11-17: 17 g via ORAL
  Filled 2023-11-16: qty 1

## 2023-11-16 NOTE — Progress Notes (Addendum)
 PROGRESS NOTE    Michael [name]  FMW:[id number] DOB: 03/10/1932 DOA: 11/13/2023 PCP: [name], MD    Chief Complaint  Patient presents with   Abdominal Pain   Emesis    Brief Narrative:  Brief Narrative: HPI per Dr. Celinda 89 y.o. male with medical history significant of adrenal insufficiency, unspecified anemia, paroxysmal atrial fibrillation, history of DVT, avascular necrosis, clavicle fracture, colon polyps, COVID-19, Crohn's disease, glaucoma, pseudophakia of both sides, vitreous prolapse of the right eye, hypomagnesemia, hypokalemia, recurrent bilateral inguinal hernia, male hypogonadism, osteopenia, retinal ischemia, B12 deficiency, vitamin D  deficiency, history of pneumonia, thrombocytopenia, history of encephalitis, history of seizure-like activity, small bowel obstruction, history of TIA, hypertension, hyperlipidemia, aortic atherosclerosis, mild cognitive impairment, overactive bladder, bilateral hearing loss who came to the emergency department due to abdominal pain, nausea, vomiting and diarrhea for the past 3 days.  No sick contacts or travel history.  He has been constipated for the last 3 days despite using magnesium  hydroxide.  No melena or hematochezia.  He denied fever, chills, rhinorrhea, sore throat, wheezing or hemoptysis.  No chest pain, palpitations, diaphoresis, PND, orthopnea or pitting edema of the lower extremities. No flank pain, dysuria, frequency or hematuria.  No polyuria, polydipsia, polyphagia or blurred vision.    Lab work: Urinalysis was normal.  CBC showed a white count of 7.1, hemoglobin 10.0 g/dL and platelets 850.  Coronavirus PCR was negative.  CMP showed a glucose of 100 mg deciliter, sodium 124 and chloride 92 mmol/L, the rest of the electrolytes, hepatic and renal functions were normal.  Magnesium  was 1.7 and phosphorus 2.4 mg/dL.  CT Status post terminal ileocolectomy and ileocolic reanastomosis. Long segment wall thickening, mucosal  hyperenhancement, and narrowing of the tethered appearing remnant distal ileum, affecting a segment at least 40 cm in length. Findings are consistent with active inflammatory bowel disease. No evidence of obstruction. Additional circumferential wall thickening of the low rectum, consistent with proctitis. Esophagus is fluid and debris-filled to the level of the thoracic inlet without visible mass or obstruction distally. Consider endoscopy to further evaluate for stricture, achalasia, or occult mass. Mild pulmonary fibrosis with a slight apical to basal gradient, featuring irregular ground-glass and nodularity in the upper lobes, with some evidence of subpleural sparing, particularly in the right upper lobe, with dependent bibasilar subpleural bronchiolectasis. Findings are suggestive of an alternative diagnosis to UIP.Emphysema.   Assessment & Plan:   Principal Problem:   Hyponatremia Active Problems:   PAF (paroxysmal atrial fibrillation) (HCC)   Crohn's disease (HCC)   Glaucoma   Aortic atherosclerosis   Seizure-like activity (HCC)   Fecal impaction of rectum (HCC)   Dysphagia   Esophageal obstruction   Gastric polyp   Esophageal stricture  #1 hypovolemic hyponatremia  - Likely secondary to prerenal azotemia in the setting of decreased oral intake in the setting of desmopressin  and Zoloft. - Noted to have started on Zoloft a week prior to admission as he was going through grief and depression as his wife currently on hospice. - Per Dr. Alvia daughter stated patient had been on DDAVP  for over a year for nocturnal bedwetting.  Family does not feel DDAVP  is helping with the nocturnal bedwetting and as such DDAVP  discontinued. - Patient with some improvement with hydration however sodium level fluctuating. - Currently receiving Zoloft. - Sodium at 134 from 130 from 135 from 126 from 124 on admission. - Improved with hydration.   - Saline lock IV fluids. - Repeat labs in the  AM.   - Supportive care.  2.  Fecal impaction of the rectum with overflow incontinence - Patient noted to have some fecal impaction of the rectum with overflow incontinence which patient presented with as diarrhea. - Patient given smog enema x 2 per gi with liquid stools. - Patient being followed by GI and patient for flexible sigmoidoscopy and EGD today. -Patient underwent flexible sigmoidoscopy which showed a poor preparation of the colon, some fecal impaction found on digital rectal exam, stool in rectum and in rectosigmoid colon, manual disimpaction and irrigation performed but unsuccessful. -GI recommending MiraLAX  prep purge which was done on 11/15/2023 with good results.   - Per GI will likely need bowel regimen of MiraLAX  daily or twice daily on discharge.  -Per GI.  3.  Esophageal stenosis status post dilatation (11/15/2023) -Patient being followed by GI patient underwent upper endoscopy 11/15/2023 which showed benign-appearing esophageal stenosis status post dilatation, single gastric polyp status post resection and retrieval. -Patient currently tolerating a full liquid diet. -Diet advanced to regular diet per GI.  4.  Crohn's disease -Per GI.  5.  Paroxysmal A-fib -Rate controlled.  -Eliquis  was held for GI procedures. - Eliquis  may be resumed today per GI.   6.  Aortic atherosclerosis -Patient not on statin therapy prior to admission. - Outpatient follow-up.  7.  History of seizure-like activity -Continue Keppra 750 mg twice daily. - Pyridoxine resumed to help with irritable side effects from Keppra.  8.  Glaucoma -Continue latanoprost  and netarsudil  drops. - Outpatient follow-up with ophthalmology.   DVT prophylaxis: SCDs>>> Eliquis  Code Status: Full Family Communication: Updated patient and caretaker at bedside.  Updated daughter on the telephone. Disposition: TBD  Status is: Inpatient    Consultants:  Gastroenterology: [name] 11/13/2023  Procedures:   CT chest abdomen pelvis 11/13/2023 Flexible sigmoidoscopy 11/15/2023 EGD 11/15/2023  Antimicrobials: Anti-infectives (From admission, onward)    Start     Dose/Rate Route Frequency Ordered Stop   11/14/23 1430  cefTRIAXone  (ROCEPHIN ) 2 g in sodium chloride  0.9 % 100 mL IVPB  Status:  Discontinued        2 g 200 mL/hr over 30 Minutes Intravenous Every 24 hours 11/13/23 1423 11/13/23 1913   11/13/23 2200  metroNIDAZOLE  (FLAGYL ) IVPB 500 mg  Status:  Discontinued        500 mg 100 mL/hr over 60 Minutes Intravenous Every 12 hours 11/13/23 1423 11/13/23 1913   11/13/23 1430  cefTRIAXone  (ROCEPHIN ) 1 g in sodium chloride  0.9 % 100 mL IVPB        1 g 200 mL/hr over 30 Minutes Intravenous  Once 11/13/23 1423 11/13/23 1953   11/13/23 1315  cefTRIAXone  (ROCEPHIN ) 1 g in sodium chloride  0.9 % 100 mL IVPB        1 g 200 mL/hr over 30 Minutes Intravenous  Once 11/13/23 1304 11/13/23 1412   11/13/23 1315  metroNIDAZOLE  (FLAGYL ) IVPB 500 mg        500 mg 100 mL/hr over 60 Minutes Intravenous  Once 11/13/23 1304 11/13/23 1512         Subjective: Patient getting ready to ambulate with physical therapy.  States he feels significantly better after MiraLAX  prep given last night.  Noted to have had multiple loose watery bowel movements per patient and caretaker.  Patient denies any chest pain or shortness of breath.  Tolerating current diet and hoping for diet to be advanced.  Objective: Vitals:   11/15/23 1630 11/15/23 1655 11/15/23 2022 11/16/23 0526  BP:  ROLLEN)  147/67 135/62 (!) 146/84  Pulse: 64 62 67 75  Resp: 17 20 18 16   Temp:  97.9 F (36.6 C) 97.7 F (36.5 C) 97.6 F (36.4 C)  TempSrc:   Oral Oral  SpO2: 99% 100% 99% 96%  Weight:      Height:        Intake/Output Summary (Last 24 hours) at 11/16/2023 1150 Last data filed at 11/16/2023 1044 Gross per 24 hour  Intake 300 ml  Output 2050 ml  Net -1750 ml   Filed Weights   11/13/23 1934  Weight: 69.9 kg     Examination:  General exam: NAD.  Respiratory system: Lungs clear to auscultation bilaterally.  No wheezes, no crackles, no rhonchi.  Fair air movement.  Speaking in full sentences.  Cardiovascular system: Regular rate rhythm no murmurs rubs or gallops.  No JVD.  No lower extremity edema.  Gastrointestinal system: Abdomen is soft, nontender, nondistended, positive bowel sounds.  No rebound.  No guarding.  Central nervous system: Alert and oriented. No focal neurological deficits. Extremities: Symmetric 5 x 5 power. Skin: No rashes, lesions or ulcers Psychiatry: Judgement and insight appear normal. Mood & affect appropriate.     Data Reviewed: I have personally reviewed following labs and imaging studies  CBC: Recent Labs  Lab 11/13/23 1037 11/13/23 1040 11/14/23 0724 11/15/23 0416 11/16/23 0422  WBC 7.1  --  4.0 4.6 3.0*  NEUTROABS 3.9  --   --   --  2.0  HGB 10.0* 9.9* 8.9* 8.2* 9.0*  HCT 30.2* 29.0* 26.9* 25.2* 28.5*  MCV 84.8  --  87.9 88.1 87.4  PLT 149*  --  128* 107* 136*    Basic Metabolic Panel: Recent Labs  Lab 11/13/23 1037 11/13/23 1040 11/13/23 1519 11/14/23 0724 11/15/23 0416 11/16/23 0422  NA 124* 126*  --  135 130* 134*  K 3.6 3.8  --  3.5 4.2 4.0  CL 92* 92*  --  106 103 105  CO2 23  --   --  22 22 21*  GLUCOSE 100* 100*  --  87 86 151*  BUN 9 8  --  5* <5* <5*  CREATININE 0.81 1.00  --  0.79 0.85 0.82  CALCIUM  8.9  --   --  8.1* 8.1* 9.0  MG  --   --  1.7  --   --  1.9  PHOS  --   --  2.4*  --   --   --     GFR: Estimated Creatinine Clearance: 60.4 mL/min (by C-G formula based on SCr of 0.82 mg/dL).  Liver Function Tests: Recent Labs  Lab 11/13/23 1037 11/14/23 0724  AST 26 16  ALT 14 9  ALKPHOS 94 57  BILITOT 1.0 0.6  PROT 6.7 5.5*  ALBUMIN 3.9 3.1*    CBG: No results for input(s): GLUCAP in the last 168 hours.   Recent Results (from the past 240 hours)  SARS Coronavirus 2 by RT PCR (hospital order, performed in [facility] hospital lab) *cepheid single result test* Anterior Nasal Swab     Status: None   Collection Time: 11/13/23 10:35 AM   Specimen: Anterior Nasal Swab  Result Value Ref Range Status   SARS Coronavirus 2 by RT PCR NEGATIVE NEGATIVE Final    Comment: (NOTE) SARS-CoV-2 target nucleic acids are NOT DETECTED.  The SARS-CoV-2 RNA is generally detectable in upper and lower respiratory specimens during the acute phase of infection. The lowest concentration of SARS-CoV-2 viral  copies this assay can detect is 250 copies / mL. A negative result does not preclude SARS-CoV-2 infection and should not be used as the sole basis for treatment or other patient management decisions.  A negative result may occur with improper specimen collection / handling, submission of specimen other than nasopharyngeal swab, presence of viral mutation(s) within the areas targeted by this assay, and inadequate number of viral copies (<250 copies / mL). A negative result must be combined with clinical observations, patient history, and epidemiological information.  Fact Sheet for Patients:   roadlaptop.co.za  Fact Sheet for Healthcare Providers: http://kim-miller.com/  This test is not yet approved or  cleared by the [place]  FDA and has been authorized for detection and/or diagnosis of SARS-CoV-2 by FDA under an Emergency Use Authorization (EUA).  This EUA will remain in effect (meaning this test can be used) for the duration of the COVID-19 declaration under Section 564(b)(1) of the Act, 21 U.S.C. section 360bbb-3(b)(1), unless the authorization is terminated or revoked sooner.  Performed at [facility], [address] [place], [place] [address]          Radiology Studies: No results found.      Scheduled Meds:  brimonidine   1 drop Left Eye BID   cholecalciferol   2,000 Units Oral Q breakfast   dorzolamide -timolol   1 drop  Left Eye BID   ketorolac   1 drop Left Eye Q6H   latanoprost   1 drop Left Eye QHS   levETIRAcetam  750 mg Oral BID   liver oil-zinc  oxide   Topical TID   metoprolol  succinate  12.5 mg Oral Daily   Netarsudil  Dimesylate  1 drop Left Eye QPM   pantoprazole   40 mg Oral QAC breakfast   pyridOXINE  50 mg Oral Daily   sertraline  25 mg Oral Daily   Continuous Infusions:  lactated ringers  100 mL/hr at 11/15/23 1310     LOS: 0 days    Time spent: 35 minutes    Toribio Hummer, MD [facility]   To contact the attending provider between 7A-7P or the covering provider during after hours 7P-7A, please log into the web site [email or web address] and access using universal [place] password for that web site. If you do not have the password, please call the hospital operator.  11/16/2023, 11:50 AM

## 2023-11-16 NOTE — Evaluation (Signed)
 Physical Therapy Evaluation Patient Details Name: LADARIEN BEEKS MRN: [id number] DOB: 15-Sep-1932 Today's Date: 11/16/2023  History of Present Illness  89 y.o. male who came to the emergency department due to abdominal pain, nausea, vomiting and diarrhea for the past 3 days. Dx of hyponatremia, fecal impaction. Pt with medical history significant of adrenal insufficiency, unspecified anemia, paroxysmal atrial fibrillation, history of DVT, avascular necrosis, clavicle fracture, colon polyps, COVID-19, Crohn's disease, glaucoma, pseudophakia of both sides, vitreous prolapse of the right eye, hypomagnesemia, hypokalemia, recurrent bilateral inguinal hernia, male hypogonadism, osteopenia, retinal ischemia, B12 deficiency, vitamin D  deficiency, history of pneumonia, thrombocytopenia, history of encephalitis, history of seizure-like activity, small bowel obstruction, history of TIA, hypertension, hyperlipidemia, aortic atherosclerosis, mild cognitive impairment, overactive bladder, bilateral hearing loss  Clinical Impression  Pt admitted with above diagnosis. Pt ambulated 140' with RW, no loss of balance. Pt had incontinence of bowel (liquid form) in standing, pt wore a pull up while walking.  Pt currently with functional limitations due to the deficits listed below (see PT Problem List). Pt will benefit from acute skilled PT to increase their independence and safety with mobility to allow discharge.           If plan is discharge home, recommend the following: Assistance with cooking/housework;A little help with bathing/dressing/bathroom;Assist for transportation;Help with stairs or ramp for entrance   Can travel by private vehicle        Equipment Recommendations None recommended by PT  Recommendations for Other Services       Functional Status Assessment Patient has had a recent decline in their functional status and demonstrates the ability to make significant improvements in function in a  reasonable and predictable amount of time.     Precautions / Restrictions Precautions Precautions: Fall Recall of Precautions/Restrictions: Intact Precaution/Restrictions Comments: denies falls in the past 6 months; incontinent of bowel Restrictions Weight Bearing Restrictions Per Provider Order: No      Mobility  Bed Mobility               General bed mobility comments: up in recliner    Transfers Overall transfer level: Needs assistance Equipment used: Rolling walker (2 wheels) Transfers: Sit to/from Stand Sit to Stand: Contact guard assist           General transfer comment: VCs hand placement    Ambulation/Gait Ambulation/Gait assistance: Contact guard assist Gait Distance (Feet): 140 Feet Assistive device: Rolling walker (2 wheels) Gait Pattern/deviations: Step-through pattern, Trunk flexed Gait velocity: WFL     General Gait Details: steady, no loss of balance; incontinent of bowel while walking (pt wore a pullup)  Stairs            Wheelchair Mobility     Tilt Bed    Modified Rankin (Stroke Patients Only)       Balance Overall balance assessment: Needs assistance Sitting-balance support: No upper extremity supported, Feet supported Sitting balance-Leahy Scale: Good     Standing balance support: Bilateral upper extremity supported, No upper extremity supported, During functional activity Standing balance-Leahy Scale: Fair                               Pertinent Vitals/Pain Pain Assessment Pain Assessment: No/denies pain    Home Living Family/patient expects to be discharged to:: Assisted living Living Arrangements: Alone Available Help at Discharge: Personal care attendant   Home Access: Level entry       Home Layout: One  level Home Equipment: Agricultural Consultant (2 wheels);Shower seat Additional Comments: recently moved to [facility]; private caregiver comes a couple hours in the morning and a couple hours in  the evening    Prior Function Prior Level of Function : Independent/Modified Independent             Mobility Comments: RW for mobility recently ADLs Comments: Reports still able to manage ADLs. Staff assisting with meals, meds, etc     Extremity/Trunk Assessment   Upper Extremity Assessment Upper Extremity Assessment: Defer to OT evaluation    Lower Extremity Assessment Lower Extremity Assessment: Overall WFL for tasks assessed    Cervical / Trunk Assessment Cervical / Trunk Assessment: Normal  Communication   Communication Communication: Impaired Factors Affecting Communication: Hearing impaired    Cognition Arousal: Alert Behavior During Therapy: WFL for tasks assessed/performed                             Following commands: Impaired Following commands impaired: Follows multi-step commands with increased time     Cueing Cueing Techniques: Verbal cues     General Comments      Exercises     Assessment/Plan    PT Assessment Patient needs continued PT services  PT Problem List Decreased balance;Decreased mobility;Decreased knowledge of use of DME;Decreased safety awareness       PT Treatment Interventions Gait training;Therapeutic exercise;Functional mobility training;Therapeutic activities;Patient/family education    PT Goals (Current goals can be found in the Care Plan section)  Acute Rehab PT Goals Patient Stated Goal: get back to working out with his trainer 5x/week PT Goal Formulation: With patient Time For Goal Achievement: 11/30/23 Potential to Achieve Goals: Good    Frequency Min 3X/week     Co-evaluation               AM-PAC PT 6 Clicks Mobility  Outcome Measure Help needed turning from your back to your side while in a flat bed without using bedrails?: None Help needed moving from lying on your back to sitting on the side of a flat bed without using bedrails?: A Little Help needed moving to and from a bed to a  chair (including a wheelchair)?: None Help needed standing up from a chair using your arms (e.g., wheelchair or bedside chair)?: A Little Help needed to walk in hospital room?: A Little Help needed climbing 3-5 steps with a railing? : A Little 6 Click Score: 20    End of Session Equipment Utilized During Treatment: Gait belt Activity Tolerance: Patient tolerated treatment well Patient left: with nursing/sitter in room;with family/visitor present;with call bell/phone within reach (on toilet) Nurse Communication: Mobility status PT Visit Diagnosis: Unsteadiness on feet (R26.81)    Time: 8891-8873 PT Time Calculation (min) (ACUTE ONLY): 18 min   Charges:   PT Evaluation $PT Eval Moderate Complexity: 1 Mod   PT General Charges $$ ACUTE PT VISIT: 1 Visit        Sylvan Delon Copp PT 11/16/2023  Acute Rehabilitation Services  Office [phone]

## 2023-11-16 NOTE — Progress Notes (Addendum)
 [facility] Progress Note  CC:   Diarrhea and Crohn's disease   Subjective: Feels good.  Had good results with the bowel purge with several bowel movements.  Now coming out as liquid.  Would like to eat.  No abdominal pain.  Joen, caretaker at bedside.  Objective:  Vital signs in last 24 hours: Temp:  [97.4 F (36.3 C)-97.9 F (36.6 C)] 97.6 F (36.4 C) (11/06 0526) Pulse Rate:  [61-75] 75 (11/06 0526) Resp:  [14-20] 16 (11/06 0526) BP: (125-160)/(62-103) 146/84 (11/06 0526) SpO2:  [96 %-100 %] 96 % (11/06 0526) Last BM Date : 11/16/23 General:  Alert, Well-developed, in NAD Heart:  Regular rate and rhythm; no murmurs Pulm:  CTAB.  No W/R/R. Abdomen:  Soft, non-distended.  BS present.  Non-tender. Rectal:  No stool in rectal vault. Neurologic:  Alert and oriented x 4;  grossly normal neurologically. Psych:  Alert and cooperative. Normal mood and affect.  Intake/Output from previous day: 11/05 0701 - 11/06 0700 In: 300 [I.V.:300] Out: 1425 [Urine:1425] Intake/Output this shift: Total I/O In: -  Out: 625 [Urine:625]  Lab Results: Recent Labs    11/14/23 0724 11/15/23 0416 11/16/23 0422  WBC 4.0 4.6 3.0*  HGB 8.9* 8.2* 9.0*  HCT 26.9* 25.2* 28.5*  PLT 128* 107* 136*   BMET Recent Labs    11/14/23 0724 11/15/23 0416 11/16/23 0422  NA 135 130* 134*  K 3.5 4.2 4.0  CL 106 103 105  CO2 22 22 21*  GLUCOSE 87 86 151*  BUN 5* <5* <5*  CREATININE 0.79 0.85 0.82  CALCIUM  8.1* 8.1* 9.0   LFT Recent Labs    11/14/23 0724  PROT 5.5*  ALBUMIN 3.1*  AST 16  ALT 9  ALKPHOS 57  BILITOT 0.6   Assessment / Plan: 89 year old male with Crohn's disease initially diagnosed in 1966 with fibrostenosing ileal Crohn's disease s/p surgery in 1968 and 2001 for small bowel resections presents with generalized weakness and nonbloody diarrhea x 3 to 4 days. CTAP with contrast consistent with s/p terminal ileocolectomy and ileocolic reanastomosis, long  segment wall thickening, mucosal hyperenhancement, and narrowing of the tethered appearing remnant distal ileum, affecting a segment at least 40 cm in length consistent with active inflammatory bowel disease, no evidence of obstruction and circumferential wall thickening of the low rectum suggestive of proctitis. Large soft stool ball in rectum. C. Diff toxin/antigen ordered, specimen not yet collected.  Suspect diarrhea secondary to constipation with overflow. He takes Imodium  1 to 2 tabs daily at home for the past 6 months. CRP 1.8. Sed rate 16.  Low suspicion for C. Diff. Received a SMOG enema yesterday evening, resulted in passing a moderate amount of watery stool per the nursing staff. Repeat rectal exam 11/4 confirmed persistent fecal impaction. Disimpaction was not performed, patient endorsed having active rectal pain on exam and he felt he could not tolerate disimpaction at that time. Received another SMOG enema 11/4, passed only loose stool. Repeat rectal exam today showed persistent stool filled rectum.  Flex sig 11/5 showed soft stool causing fecal impaction/throughout the rectosigmoid colon. - Hold Imodium   - Will need a bowel regimen, likely MiraLAX  once or twice daily to prevent recurrence of constipation/fecal impaction   Chronic iron deficiency anemia, likely secondary to Crohn's disease. No overt GI bleeding. Hg 10 -> 9.9 -> Hg 8.9 -> 8.2->9.0. Iron 27. Saturation ratios 13. TIBC 207. Ferritin 160.  - Transfuse for Hg level < 8   Thrombocytopenia,  unclear etiology. PLT 149 -> 128 -> 107->136.    History of GERD and infrequent dysphagia. Patient had an episode of dysphagia this morning, felt like egg was stuck in his throat/upper esophagus. Chest CT showed the esophagus is fluid and debris-filled to the level of the thoracic inlet without visible mass or obstruction distally.  Patient was seen by speech pathologist earlier today, exam was limited as patient was assessed to be confused and  was hard to redirect.  He coughed on his first sip of water  without overt signs of aspiration. Speech pathologist suspects esophageal dysphagia, potentially at risk for aspiration.  EGD 11/5:  - Benign- appearing esophageal stenosis. Dilated. - A single gastric polyp. Resected and retrieved. - Pantoprazole  40 mg daily.   Colonoscopy 03/2020 showed Crohn's disease with mild active ileitis, patent end-to-end ileocolonic anastomosis with nonspecific inflammatory changes without granulomas or dysplasia.   Hyponatremia. NA+ 134 today.  - Management per the hospitalist   History of atrial fibrillation on Eliquis . Lasts dose of Eliquis  was 8am on 11/3. - Restart Eliquis  today.   History of colon polyps, colonoscopy 03/2020 identified a polypoid lesion in the ascending colon, path report showed fragments of polypoid colonic mucosa with low-grade dysplasia with a tubular architecture and an inflammatory polyp. -No further no further surveillance colonoscopies recommended due to age   Chest CT showed evidence of mild pulmonary fibrosis and emphysema   CAD per chest CT   Recent hospital admission 10/18 - 10/30/2023 with acute change in mental status with aphasia. CT head, CT angio head and neck and MRI brain negative for acute CVA or other intracranial abnormality.   Normal EEG was normal.  Treated with Keppra for suspected seizure activity.     LOS: 0 days   Harlene BIRCH. Zehr  11/16/2023, 11:46 AM     [facility] Attending   I have taken an interval history, reviewed the chart and examined the patient.  The majority the medical decision making was performed by me.  Case reviewed with advanced practice provider.  I agree with the Advanced Practitioner's note, impression and recommendations with the following additions:  Fecal impaction has been successfully treated with MiraLAX  prep/purge.  He may resume Eliquis .  Dysphagia is treated by dilating esophageal stricture.  Daily PPI.  Await polyp pathology  from stomach.  This will be followed up as outpatient.  I think he is close to being ready for discharge, likely tomorrow.  Note that he should take MiraLAX  daily for bowel regimen to reduce the risk of repeat constipation and fecal impaction.  Question the role of hyponatremia with that.  Keppra could have been the culprit for hyponatremia so this needs to be watched for (i.e. hyponatremia) as an outpatient.  Question change antiepileptic.  Defer to TRH.  Signing off.  We will arrange for an outpatient follow-up in our clinic with [name] or one of the apps on his team.    Lupita CHARLENA Commander, MD, [facility] See TRACEY on call - gastroenterology for best contact person 11/16/2023 4:07 PM

## 2023-11-16 NOTE — TOC Progression Note (Signed)
 Transition of Care [facility]) - Progression Note   Patient Details  Name: Michael [name] MRN: [id number] Date of Birth: 05/05/1932  [facility] CM/SW Contact  Duwaine GORMAN Aran, LCSW Phone Number: 11/16/2023, 12:08 PM  Clinical Narrative: CSW notified by Artavia with [facility] that patient is active with the agency for HHPT/OT. CSW requested HH orders. CSW confirmed with daughter, [name], that patient resides at [facility] and the plan is to return there at discharge. Care management to follow.  Expected Discharge Plan: Home w Home Health Services Barriers to Discharge: Continued Medical Work up  Expected Discharge Plan and Services In-house Referral: Clinical Social Work Post Acute Care Choice: Home Health Living arrangements for the past 2 months: Single Family Home           DME Arranged: N/A DME Agency: NA HH Arranged: PT, OT HH Agency: [facility] Date HH Agency Contacted: 11/16/23 Time HH Agency Contacted: 1131 Representative spoke with at [facility] Agency: [facility]  Social Drivers of Health (SDOH) Interventions SDOH Screenings   Food Insecurity: No Food Insecurity (11/13/2023)  Housing: Low Risk  (11/13/2023)  Transportation Needs: No Transportation Needs (11/13/2023)  Utilities: Not At Risk (11/13/2023)  Alcohol Screen: Low Risk  (06/22/2023)  Depression (PHQ2-9): Low Risk  (09/20/2023)  Financial Resource Strain: Low Risk  (06/22/2023)  Physical Activity: Sufficiently Active (06/22/2023)  Social Connections: Socially Integrated (11/13/2023)  Stress: No Stress Concern Present (06/22/2023)  Tobacco Use: Medium Risk (11/13/2023)  Health Literacy: Adequate Health Literacy (06/22/2023)   Readmission Risk Interventions     No data to display

## 2023-11-16 NOTE — Evaluation (Signed)
 Occupational Therapy Evaluation Patient Details Name: Michael [name] MRN: [id number] DOB: [birth year] Today's Date: 11/16/2023   History of Present Illness   89 y.o. male who came to the emergency department due to abdominal pain, nausea, vomiting and diarrhea for the past 3 days. Dx of hyponatremia, fecal impaction. Pt with medical history significant of adrenal insufficiency, unspecified anemia, paroxysmal atrial fibrillation, history of DVT, avascular necrosis, clavicle fracture, colon polyps, COVID-19, Crohn's disease, glaucoma, pseudophakia of both sides, vitreous prolapse of the right eye, hypomagnesemia, hypokalemia, recurrent bilateral inguinal hernia, male hypogonadism, osteopenia, retinal ischemia, B12 deficiency, vitamin D  deficiency, history of pneumonia, thrombocytopenia, history of encephalitis, history of seizure-like activity, small bowel obstruction, history of TIA, hypertension, hyperlipidemia, aortic atherosclerosis, mild cognitive impairment, overactive bladder, bilateral hearing loss     Clinical Impressions PTA, pt recently moved to [facility], ambulatory with RW and reports still managing ADLs. Pt presents now with minor deficits in strength, endurance and dynamic standing balance. Pt eager to mobilize though initially limited by bowel incontinence. Pt requiring overall Min A for LB ADL and CGA for hallway mobility using RW. Recommend HHOT follow up at ALF upon DC to further maximize return to independence with ADL/mobility.     If plan is discharge home, recommend the following:   A little help with walking and/or transfers;A little help with bathing/dressing/bathroom;Assistance with cooking/housework;Assist for transportation;Help with stairs or ramp for entrance     Functional Status Assessment   Patient has had a recent decline in their functional status and demonstrates the ability to make significant improvements in function in a reasonable and predictable  amount of time.     Equipment Recommendations   None recommended by OT     Recommendations for Other Services         Precautions/Restrictions   Precautions Precautions: Fall Recall of Precautions/Restrictions: Intact Restrictions Weight Bearing Restrictions Per Provider Order: No     Mobility Bed Mobility Overal bed mobility: Needs Assistance Bed Mobility: Supine to Sit     Supine to sit: Min assist, HOB elevated     General bed mobility comments: handheld assist requested to lift trunk    Transfers Overall transfer level: Needs assistance Equipment used: Rolling walker (2 wheels) Transfers: Sit to/from Stand Sit to Stand: Contact guard assist                  Balance Overall balance assessment: Needs assistance Sitting-balance support: No upper extremity supported, Feet supported Sitting balance-Leahy Scale: Good     Standing balance support: Bilateral upper extremity supported, No upper extremity supported, During functional activity Standing balance-Leahy Scale: Fair                             ADL either performed or assessed with clinical judgement   ADL Overall ADL's : Needs assistance/impaired Eating/Feeding: Independent;Sitting   Grooming: Supervision/safety;Standing;Oral care Grooming Details (indicate cue type and reason): standing at sink, no UE support needed Upper Body Bathing: Set up;Sitting   Lower Body Bathing: Minimal assistance;Sitting/lateral leans;Sit to/from stand   Upper Body Dressing : Set up;Sitting   Lower Body Dressing: Minimal assistance;Sitting/lateral leans;Sit to/from stand   Toilet Transfer: Contact guard assist;Ambulation;Rolling walker (2 wheels);BSC/3in1 Toilet Transfer Details (indicate cue type and reason): to [facility] in room d/t stool incontinence noted upon standing/initiating walk Toileting- Clothing Manipulation and Hygiene: Minimal assistance;Sitting/lateral lean;Sit to/from stand        Functional mobility during ADLs: Contact  guard assist;Rolling walker (2 wheels)       Vision Baseline Vision/History: 1 Wears glasses;3 Glaucoma Ability to See in Adequate Light: 1 Impaired Patient Visual Report: No change from baseline Vision Assessment?: No apparent visual deficits     Perception         Praxis         Pertinent Vitals/Pain Pain Assessment Pain Assessment: Faces Faces Pain Scale: Hurts a little bit Pain Location: bottom Pain Descriptors / Indicators: Sore Pain Intervention(s): Monitored during session, Limited activity within patient's tolerance     Extremity/Trunk Assessment Upper Extremity Assessment Upper Extremity Assessment: Generalized weakness;Right hand dominant (and fragile skin)   Lower Extremity Assessment Lower Extremity Assessment: Defer to PT evaluation   Cervical / Trunk Assessment Cervical / Trunk Assessment: Normal   Communication Communication Communication: Impaired Factors Affecting Communication: Hearing impaired   Cognition Arousal: Alert Behavior During Therapy: WFL for tasks assessed/performed Cognition: Difficult to assess Difficult to assess due to: Hard of hearing/deaf           OT - Cognition Comments: pleasant, following commands fairly well. cues for safety and problem solving helpful                 Following commands: Impaired Following commands impaired: Follows multi-step commands with increased time     Cueing  General Comments   Cueing Techniques: Verbal cues      Exercises     Shoulder Instructions      Home Living Family/patient expects to be discharged to:: Assisted living                             Home Equipment: Rolling Walker (2 wheels);Shower seat   Additional Comments: recently moved to [facility] 2 weeks ago      Prior Functioning/Environment Prior Level of Function : Independent/Modified Independent             Mobility Comments: RW for  mobility recently ADLs Comments: Reports still able to manage ADLs. Staff assisting with meals, meds, etc    OT Problem List: Decreased strength;Decreased activity tolerance;Impaired balance (sitting and/or standing);Decreased cognition   OT Treatment/Interventions: Self-care/ADL training;Therapeutic exercise;DME and/or AE instruction;Therapeutic activities;Patient/family education;Balance training      OT Goals(Current goals can be found in the care plan section)   Acute Rehab OT Goals Patient Stated Goal: walk more, for stomach issues to resolve OT Goal Formulation: With patient Time For Goal Achievement: 11/30/23 Potential to Achieve Goals: Good ADL Goals Pt Will Perform Lower Body Dressing: with modified independence;sit to/from stand;sitting/lateral leans Pt Will Transfer to Toilet: with modified independence;ambulating Pt Will Perform Toileting - Clothing Manipulation and hygiene: with modified independence;sit to/from stand;sitting/lateral leans Pt/caregiver will Perform Home Exercise Program: Both right and left upper extremity;Increased strength;With theraband;Independently;With written HEP provided Additional ADL Goal #1: Pt to demonstrate ability to reach overhead and/or to floor to access ADL items with Supervision and without LOB   OT Frequency:  Min 2X/week    Co-evaluation              AM-PAC OT 6 Clicks Daily Activity     Outcome Measure Help from another person eating meals?: None Help from another person taking care of personal grooming?: A Little Help from another person toileting, which includes using toliet, bedpan, or urinal?: A Little Help from another person bathing (including washing, rinsing, drying)?: A Little Help from another person to put on and taking  off regular upper body clothing?: A Little Help from another person to put on and taking off regular lower body clothing?: A Little 6 Click Score: 19   End of Session Equipment Utilized During  Treatment: Gait belt;Rolling walker (2 wheels) Nurse Communication: Other (comment) (NT present during session)  Activity Tolerance: Patient tolerated treatment well Patient left: in chair;with call bell/phone within reach;with chair alarm set  OT Visit Diagnosis: Unsteadiness on feet (R26.81);Other abnormalities of gait and mobility (R26.89);Muscle weakness (generalized) (M62.81)                Time: 9196-9170 OT Time Calculation (min): 26 min Charges:  OT General Charges $OT Visit: 1 Visit OT Evaluation $OT Eval Low Complexity: 1 Low OT Treatments $Self Care/Home Management : 8-22 mins  Mliss NOVAK, OTR/L Acute Rehab Services Office: [phone]   Mliss Fish 11/16/2023, 8:44 AM

## 2023-11-16 NOTE — Plan of Care (Signed)

## 2023-11-16 NOTE — Progress Notes (Signed)
 Speech Language Pathology Treatment: Dysphagia  Patient Details Name: Michael [name] MRN: [id number] DOB: December 12, 1932 Today's Date: 11/16/2023 Time: 8840-8785 SLP Time Calculation (min) (ACUTE ONLY): 15 min  Assessment / Plan / Recommendation Clinical Impression  Pt's mentation has returned to his baseline per caregiver. He remains on clear liquids s/p EGD yesterday and is very eager to resume solids. He endorses increased odynophagia today, which may be secondary to brief intubation for the procedure. He uses a fast rate and delayed throat clearance intermittently follows straw sips of juice, cup sips of broth, and bites of jello. Now that mentation has improved, recommend advancing to regular diet once medically able per GI. Given acute odynophagia, will f/u at least briefly for further assessment.    HPI HPI: 89 yo presenting 11/3 with several days of N/V/D, also feeling like he has eggs stuck in his throat. Admitted with hyponatremia and fecal impaction of rectum. Per GI consult note 11/3, pt with increased symptoms of heartburn over the past few weeks; infrequent dysphagia, food rarely gets stuck briefly in his esophagus. Chest CT showed the esophagus is fluid and debris-filled to the level of the thoracic inlet without visible mass or obstruction distally. EGD 11/6 showed a benign-appearing stricture s/p dilation and sigmoidoscopy shows soft fecal impaction.   PMH includes: PNA, h/o seizure-like activity (recent admission in October 2025 with AMS and aphasia, code stroke was canceled and CTH, CTA, and MRI all negative), SBO, TIA, mild cognitive impairment, bilateral hearing loss, Crohn's disease, glaucoma      SLP Plan  Continue with current plan of care          Recommendations  Diet recommendations: Regular;Thin liquid Liquids provided via: Cup;Straw Medication Administration: Crushed with puree Supervision: Patient able to self feed;Intermittent supervision to cue for compensatory  strategies;Trained caregiver to feed patient Compensations: Minimize environmental distractions;Slow rate;Small sips/bites Postural Changes and/or Swallow Maneuvers: Out of bed for meals                  Oral care BID   PRN Dysphagia, unspecified (R13.10)     Continue with current plan of care     Damien Blumenthal, M.A., CCC-SLP Speech Language Pathology, Acute Rehabilitation Services  Secure Chat preferred [phone]   11/16/2023, 12:29 PM

## 2023-11-17 DIAGNOSIS — R1310 Dysphagia, unspecified: Secondary | ICD-10-CM

## 2023-11-17 DIAGNOSIS — K317 Polyp of stomach and duodenum: Secondary | ICD-10-CM

## 2023-11-17 LAB — SURGICAL PATHOLOGY

## 2023-11-17 MED ORDER — PANTOPRAZOLE SODIUM 40 MG PO TBEC: 40.0000 mg | DELAYED_RELEASE_TABLET | Freq: Every day | ORAL | 0 refills | Status: AC

## 2023-11-17 MED ORDER — POLYETHYLENE GLYCOL 3350 17 G PO PACK: 17.0000 g | PACK | Freq: Every day | ORAL | Status: AC

## 2023-11-17 MED ORDER — ZINC OXIDE 40 % EX OINT: TOPICAL_OINTMENT | Freq: Three times a day (TID) | CUTANEOUS | 0 refills | Status: AC

## 2023-11-17 NOTE — Discharge Summary (Signed)
 Physician Discharge Summary  [name] FMW:[id number] DOB: 1932-09-09 DOA: 11/13/2023  PCP: [name], MD  Admit date: 11/13/2023 Discharge date: 11/17/2023  Time spent: 60 minutes  Recommendations for Outpatient Follow-up:  Patient was discharged back to independent living facility with home health. Follow-up with [name], MD in 1 week.  On follow-up patient will need a basic metabolic profile done to follow-up on electrolytes and renal function.  Patient will need a CBC done to follow-up on counts.  If patient with worsening hyponatremia will need to consider discontinuation of Zoloft or changing to a different antidepressant. Follow-up with Nestor Blower, PA, GI on 01/01/2024 at 10:40 AM.   Discharge Diagnoses:  Principal Problem:   Hyponatremia Active Problems:   PAF (paroxysmal atrial fibrillation) (HCC)   Crohn's disease (HCC)   Glaucoma   Aortic atherosclerosis   Seizure-like activity (HCC)   Fecal impaction of rectum (HCC)   Dysphagia   Esophageal obstruction   Gastric polyp   Esophageal stricture   Discharge Condition: Stable and improved.  Diet recommendation: Regular  Filed Weights   11/13/23 1934  Weight: 69.9 kg    History of present illness:  HPI per Dr. Celinda Sharman Michael [name] is a 89 y.o. male with medical history significant of adrenal insufficiency, unspecified anemia, paroxysmal atrial fibrillation, history of DVT, avascular necrosis, clavicle fracture, colon polyps, COVID-19, Crohn's disease, glaucoma, pseudophakia of both sides, vitreous prolapse of the right eye, hypomagnesemia, hypokalemia, recurrent bilateral inguinal hernia, male hypogonadism, osteopenia, retinal ischemia, B12 deficiency, vitamin D  deficiency, history of pneumonia, thrombocytopenia, history of encephalitis, history of seizure-like activity, small bowel obstruction, history of TIA, hypertension, hyperlipidemia, aortic atherosclerosis, mild cognitive impairment,  overactive bladder, bilateral hearing loss who came to the emergency department due to abdominal pain, nausea, vomiting and diarrhea for the past 3 days.  No sick contacts or travel history.  He has been constipated for the last 3 days despite using magnesium  hydroxide.  No melena or hematochezia.  He denied fever, chills, rhinorrhea, sore throat, wheezing or hemoptysis.  No chest pain, palpitations, diaphoresis, PND, orthopnea or pitting edema of the lower extremities. No flank pain, dysuria, frequency or hematuria.  No polyuria, polydipsia, polyphagia or blurred vision.    Lab work: Urinalysis was normal.  CBC showed a white count of 7.1, hemoglobin 10.0 g/dL and platelets 850.  Coronavirus PCR was negative.  CMP showed a glucose of 100 mg deciliter, sodium 124 and chloride 92 mmol/L, the rest of the electrolytes, hepatic and renal functions were normal.  Magnesium  was 1.7 and phosphorus 2.4 mg/dL.   Imaging: Status post terminal ileal colectomy and ileocolic anastomosis.  Longstanding wall thickening with mucosal hyperenhancement and narrowing of the tethered appearing remnant distal ileum consistent with active inflammatory disease.  Additional circumferential wall thickening of the rectum consistent with proctitis.  Esophagus is fluid and debris feel to the level of the thoracic inlet with cell visible mass or obstruction distally.  Mild pulmonary fibrosis with a slight apical to basal gradient with ground glass and nodularity in the upper lobes, there is some evidence of subpleural sparing, particularly in the right upper lobe, with dependent bibasilar subpleural bronchiectasis.  Findings are suggestive of an alternative diagnosis to usual interstitial pneumonia.  Emphysema.  Coronary artery disease.  Aortic atherosclerosis.   ED course: Initial vital signs were temperature 98.5 F, pulse 89, respirations 17, BP 140/80 mmHg and O2 sat 97% on room air.  The patient received ceftriaxone  2 g IVPB,  LR 500 mL  bolus, metronidazole  500 mg IVPB and ondansetron  4 mg IVP.  GI consult was requested.   Hospital Course:  #1 hypovolemic hyponatremia  - Likely secondary to prerenal azotemia in the setting of decreased oral intake in the setting of desmopressin  and Zoloft. - Noted to have started on Zoloft a week prior to admission as he was going through grief and depression as his wife currently on hospice. - Per Dr. Alvia daughter stated patient had been on DDAVP  for over a year for nocturnal bedwetting.  Family does not feel DDAVP  is helping with the nocturnal bedwetting and as such DDAVP  discontinued. - Patient with improvement with hydration during the hospitalization - Patient maintained on Zoloft. - Sodium at 134 from 130 from 135 from 126 from 124 on admission. - IV fluids with saline lock. -Outpatient follow-up with PCP in 1 week for repeat lab work.  If patient still with significant hyponatremia may consider discontinuation of Zoloft.   2.  Fecal impaction of the rectum with overflow incontinence - Patient noted to have some fecal impaction of the rectum with overflow incontinence which patient presented with as diarrhea. - Patient given smog enema x 2 per gi with liquid stools. - Patient being followed by GI and patient for flexible sigmoidoscopy and EGD today. -Patient underwent flexible sigmoidoscopy which showed a poor preparation of the colon, some fecal impaction found on digital rectal exam, stool in rectum and in rectosigmoid colon, manual disimpaction and irrigation performed but unsuccessful. -GI recommended MiraLAX  prep purge which was done on 11/15/2023 with good results.   - Per GI will likely need bowel regimen of MiraLAX  daily or twice daily on discharge.  -Patient will discharge on MiraLAX  daily. -Outpatient follow-up with GI.   3.  Esophageal stenosis status post dilatation (11/15/2023) -Patient being followed by GI patient underwent upper endoscopy 11/15/2023 which showed  benign-appearing esophageal stenosis status post dilatation, single gastric polyp status post resection and retrieval. -Patient initially placed on a full liquid diet which he tolerated diet advanced to a regular diet which patient tolerated.   - GI recommended PPI daily.   - Outpatient follow-up with GI.    4.  Crohn's disease -Per GI. -Outpatient follow-up with GI.   5.  Paroxysmal A-fib -Rate controlled during the hospitalization.  -Eliquis  was held for GI procedures. - Eliquis  resumed.   - Outpatient follow-up.     6.  Aortic atherosclerosis -Patient not on statin therapy prior to admission. - Outpatient follow-up.   7.  History of seizure-like activity -Patient maintained on home regimen Keppra 750 mg twice daily. - Pyridoxine resumed to help with irritable side effects from Keppra. -Outpatient follow-up with neurology as previously recommended from prior hospitalization.   8.  Glaucoma - Patient maintained on home regimen latanoprost  and netarsudil  drops. - Outpatient follow-up with ophthalmology.    Procedures: CT chest abdomen pelvis 11/13/2023 Flexible sigmoidoscopy 11/15/2023 EGD 11/15/2023  Consultations: Gastroenterology: [name] 11/13/2023    Discharge Exam: Vitals:   11/17/23 0510 11/17/23 0857  BP: (!) 154/70 (!) 151/82  Pulse: 74 62  Resp: 17   Temp: 98.1 F (36.7 C)   SpO2: 93%     General: NAD Cardiovascular: RRR no murmurs rubs or gallops.  No JVD.  No lower extremity edema. Respiratory: Clear to auscultation bilaterally.  No wheezes, no crackles, no rhonchi.  Fair air movement.  Speaking full sentences.  Discharge Instructions   Discharge Instructions     Diet general  Complete by: As directed    Increase activity slowly   Complete by: As directed    No wound care   Complete by: As directed       Allergies as of 11/17/2023   No Known Allergies      Medication List     PAUSE taking these medications    desmopressin  0.1 MG  tablet Wait to take this until your doctor or other care provider tells you to start again. Commonly known as: DDAVP  Take 0.1 mg by mouth at bedtime.   omeprazole  20 MG capsule Wait to take this until your doctor or other care provider tells you to start again. Commonly known as: PRILOSEC  Take 20 mg by mouth daily as needed (for heartburn).       TAKE these medications    acetaminophen  500 MG tablet Commonly known as: TYLENOL  Take 500-1,000 mg by mouth every 8 (eight) hours as needed for mild pain or headache.   alendronate  70 MG tablet Commonly known as: FOSAMAX  Take 1 tablet (70 mg total) by mouth every 7 (seven) days. Take with a full glass of water  on an empty stomach.   apixaban  5 MG Tabs tablet Commonly known as: Eliquis  Take 1 tablet (5 mg total) by mouth 2 (two) times daily.   augmented betamethasone dipropionate 0.05 % cream Commonly known as: DIPROLENE-AF Apply 1 application  topically 2 (two) times daily as needed (Grover's disease- legs).   B-D 3CC LUER-LOK SYR 25GX1 25G X 1 3 ML Misc Generic drug: SYRINGE-NEEDLE (DISP) 3 ML USE MONTHLY FOR B-12 INJECTIONS   brimonidine  0.2 % ophthalmic solution Commonly known as: ALPHAGAN  Place 1 drop into the left eye in the morning and at bedtime.   cyanocobalamin  1000 MCG/ML injection Commonly known as: VITAMIN B12 INJECT 1ML INTO THE MUSCLE ONCE MONTHLY   dorzolamide -timolol  2-0.5 % ophthalmic solution Commonly known as: COSOPT  Place 1 drop into the left eye in the morning and at bedtime.   latanoprost  0.005 % ophthalmic solution Commonly known as: XALATAN  Place 1 drop into the left eye at bedtime.   levETIRAcetam 750 MG tablet Commonly known as: KEPPRA Take 1 tablet (750 mg total) by mouth 2 (two) times daily.   liver oil-zinc  oxide 40 % ointment Commonly known as: DESITIN Apply topically 3 (three) times daily for 10 days. Apply to groin   loperamide  2 MG tablet Commonly known as: IMODIUM  A-D Take 2 mg  by mouth daily as needed for diarrhea or loose stools.   metoprolol  succinate 25 MG 24 hr tablet Commonly known as: TOPROL -XL Take 0.5 tablets (12.5 mg total) by mouth daily.   Netarsudil  Dimesylate 0.02 % Soln Place 1 drop into the left eye at bedtime.   pantoprazole  40 MG tablet Commonly known as: PROTONIX  Take 1 tablet (40 mg total) by mouth daily before breakfast. Start taking on: November 18, 2023   polyethylene glycol 17 g packet Commonly known as: MIRALAX  / GLYCOLAX  Take 17 g by mouth daily. Start taking on: November 18, 2023   pyridOXINE 50 MG tablet Commonly known as: B-6 Take 1 tablet (50 mg total) by mouth daily.   sertraline 25 MG tablet Commonly known as: ZOLOFT Take 1 tablet (25 mg total) by mouth daily.   Tums 500 MG chewable tablet Generic drug: calcium  carbonate Chew 1 tablet by mouth 3 (three) times daily as needed for indigestion or heartburn.   vitamin C  1000 MG tablet Take 1,000 mg by mouth daily with breakfast.   Vitamin  D3 50 MCG (2000 UT) Tabs Take 2,000 Units by mouth daily with breakfast.       No Known Allergies  Contact information for follow-up providers     Mollie Pfeiffer M, PA-C Follow up on 01/01/2024.   Specialty: Gastroenterology Why: 10:40 AM Contact information: [address] [place] [address] [phone]         [name], MD. Schedule an appointment as soon as possible for a visit in 1 week(s).   Specialty: Internal Medicine Why: will need BMET in 1 week to follow sodium levels. Contact information: [address] [place] [address] [phone]              Contact information for after-discharge care     Home Medical Care     [facility] [place] [facility]) .   Service: Home Health Services Why: [facility] will resume PT and OT after discharge. Contact information: [address] [place]  [address] [phone]                       The results of significant diagnostics from this hospitalization (including imaging, microbiology, ancillary and laboratory) are listed below for reference.    Significant Diagnostic Studies: CT CHEST ABDOMEN PELVIS W CONTRAST Result Date: 11/13/2023 CLINICAL DATA:  Abdominal pain, nausea, vomiting, diarrhea for 3 days, patient feels like eggs stuck in throat EXAM: CT CHEST, ABDOMEN, AND PELVIS WITH CONTRAST TECHNIQUE: Multidetector CT imaging of the chest, abdomen and pelvis was performed following the standard protocol during bolus administration of intravenous contrast. RADIATION DOSE REDUCTION: This exam was performed according to the departmental dose-optimization program which includes automated exposure control, adjustment of the mA and/or kV according to patient size and/or use of iterative reconstruction technique. CONTRAST:  OMNIPAQUE  IOHEXOL  300 MG/ML  SOLN COMPARISON:  None Available. FINDINGS: CT CHEST FINDINGS Cardiovascular: Scattered aortic atherosclerosis. Normal heart size. Left coronary artery calcifications. No pericardial effusion. Mediastinum/Nodes: No enlarged mediastinal, hilar, or axillary lymph nodes. Esophagus is fluid and debris-filled to the level of the thoracic inlet without visible mass or obstruction distally. Thyroid  gland and trachea demonstrate no significant findings. Lungs/Pleura: Mild centrilobular and paraseptal emphysema. Mild pulmonary fibrosis with a slight apical to basal gradient, featuring irregular ground-glass and nodularity in the upper lobes (series 6, image 45), with some evidence of subpleural sparing, particularly in the right upper lobe, with dependent bibasilar subpleural bronchiolectasis. No pleural effusion or pneumothorax. Musculoskeletal: No chest wall abnormality. No acute osseous findings. CT ABDOMEN PELVIS FINDINGS Hepatobiliary: No solid liver abnormality is seen. Distended gallbladder. No gallstones, gallbladder wall thickening, or  biliary dilatation. Pancreas: Unremarkable. No pancreatic ductal dilatation or surrounding inflammatory changes. Spleen: Normal in size without significant abnormality. Adrenals/Urinary Tract: Adrenal glands are unremarkable. Kidneys are normal, without renal calculi, solid lesion, or hydronephrosis. Bladder is unremarkable. Stomach/Bowel: Stomach is within normal limits. Status post terminal ileocolectomy and ileocolic reanastomosis. Long segment wall thickening, mucosal hyperenhancement, and narrowing of the tethered appearing remnant distal ileum, affecting a segment at least 40 cm in length (series 8, image 86). Additional circumferential wall thickening of the low rectum (series 2, image 128). Moderate burden of stool balls in the distal colon and rectum, with fluid in the proximal colon. Vascular/Lymphatic: Aortic atherosclerosis. No enlarged abdominal or pelvic lymph nodes. Reproductive: Prostatomegaly with median lobe hypertrophy. Other: No abdominal wall hernia or abnormality. No ascites. Musculoskeletal: No acute osseous findings. IMPRESSION: 1. Status  post terminal ileocolectomy and ileocolic reanastomosis. Long segment wall thickening, mucosal hyperenhancement, and narrowing of the tethered appearing remnant distal ileum, affecting a segment at least 40 cm in length. Findings are consistent with active inflammatory bowel disease. No evidence of obstruction. 2. Additional circumferential wall thickening of the low rectum, consistent with proctitis. 3. Esophagus is fluid and debris-filled to the level of the thoracic inlet without visible mass or obstruction distally. Consider endoscopy to further evaluate for stricture, achalasia, or occult mass. 4. Mild pulmonary fibrosis with a slight apical to basal gradient, featuring irregular ground-glass and nodularity in the upper lobes, with some evidence of subpleural sparing, particularly in the right upper lobe, with dependent bibasilar subpleural  bronchiolectasis. 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 5. Emphysema. 6. Coronary artery disease. Aortic Atherosclerosis (ICD10-I70.0) and Emphysema (ICD10-J43.9). Electronically Signed   By: Marolyn JONETTA Jaksch M.D.   On: 11/13/2023 11:50   Overnight EEG with video Result Date: 10/29/2023 Michael Arlin KIDD, MD     10/30/2023 10:54 AM Patient Name: Michael [name] MRN: [id number] Epilepsy Attending: Arlin [name] Michael Referring Physician/Provider: Sallyann Normie HERO, MD Duration: 10/18/202 1745 to 10/29/2023 1126 Patient history:  89 y.o. male hx of A fib on Eliquis , crohns disease, glaucoma, TIA, memory impairments, B12 deficiency who was visiting his wife with his daughter at the bedside on 3W. LKW 1500. He was watching a volleyball game with his daughter when he acutely had a change. Per the daughter, He suddenly turned his head to the right and started picking  with his fingers and seeming to get agitated and was not speaking. EEG to evaluate for seizure Level of alertness: Awake, asleep AEDs during EEG study: None Technical aspects: This EEG study was done with scalp electrodes positioned according to the 10-20 International system of electrode placement. Electrical activity was reviewed with band pass filter of 1-70Hz , sensitivity of 7 uV/mm, display speed of 72mm/sec with a 60Hz  notched filter applied as appropriate. EEG data were recorded continuously and digitally stored.  Video monitoring was available and reviewed as appropriate. Description: The posterior dominant rhythm consists of 8-9 Hz activity of moderate voltage (25-35 uV) seen predominantly in posterior head regions, symmetric and reactive to eye opening and eye closing. Sleep was characterized by vertex waves, sleep spindles (12 to 14 Hz), maximal frontocentral region. Hyperventilation and photic stimulation were not performed.   EEG was unhooked between 10/28/2023 1814 to 1856 for MRI IMPRESSION: This study is within normal limits.  No seizures or epileptiform discharges were seen throughout the recording. A normal interictal EEG does not exclude the diagnosis of epilepsy. Arlin [name] Michael   MR BRAIN WO CONTRAST Result Date: 10/28/2023 EXAM: MRI BRAIN WITHOUT CONTRAST 10/28/2023 09:49:22 PM TECHNIQUE: Multiplanar multisequence MRI of the head/brain was performed without the administration of intravenous contrast. COMPARISON: Prior CTs from earlier the same day. CLINICAL HISTORY: Neuro deficit, acute, stroke suspected. FINDINGS: BRAIN AND VENTRICLES: No acute infarct. No intracranial hemorrhage. No mass. No midline shift. No hydrocephalus. Generalized age-related cerebral atrophy. Patchy T2/FLAIR hyperintensity involving the periventricular and deep white matter of both cerebral hemispheres, consistent with chronic small vessel ischemic disease. Few small remote lacunar infarcts present in the hemispheric cerebral white matter, left basal ganglia, and left thalamus. The sella is unremarkable. Normal flow voids. ORBITS: Prior bilateral ocular lens replacement. SINUSES AND MASTOIDS: Changes of chronic right maxillary sinusitis noted. BONES AND SOFT TISSUES: Normal marrow signal. No acute soft tissue abnormality. IMPRESSION: 1. No acute intracranial abnormality.  2. Generalized age-related cerebral atrophy. 3. Chronic small vessel ischemic disease with a few small remote lacunar infarcts about the hemispheric cerebral white matter, left basal ganglia, and left thalamus. Electronically signed by: Morene Hoard MD 10/28/2023 10:32 PM EDT RP Workstation: [id number]   CT ANGIO HEAD NECK W WO CM Result Date: 10/28/2023 EXAM: CTA Head and Neck with Intravenous Contrast. CT Head without Contrast. CLINICAL HISTORY: Transient ischemic attack (TIA). TECHNIQUE: Axial CTA images of the head and neck performed with intravenous contrast. MIP reconstructed images were created and reviewed. Axial computed tomography images of the head/brain performed  without intravenous contrast. 75 mL iohexol  (OMNIPAQUE ) 350 MG/ML injection. Note: Per PQRS, the description of internal carotid artery percent stenosis, including 0 percent or normal exam, is based on North American Symptomatic Carotid Endarterectomy Trial (NASCET) criteria. Dose reduction technique was used including one or more of the following: automated exposure control, adjustment of mA and kV according to patient size, and/or iterative reconstruction. CONTRAST: With and without IV contrast; 75 mL iohexol  (OMNIPAQUE ) 350 MG/ML injection. COMPARISON: CT from earlier in the same day. FINDINGS: CT HEAD: BRAIN: No acute intraparenchymal hemorrhage. No mass lesion. No CT evidence for acute territorial infarct. No midline shift or extra-axial collection. Age-related cerebral atrophy with chronic microvascular ischemic disease. Calcified atherosclerosis present about the skull base. EEG leads overlie the scalp. VENTRICLES: No hydrocephalus. ORBITS: The orbits are unremarkable. SINUSES AND MASTOIDS: Changes of chronic right maxillary sinusitis noted. The remaining paranasal sinuses and mastoid air cells are clear. CTA NECK: COMMON CAROTID ARTERIES: Mild atheromatous change about the carotid bulbs bilaterally without hemodynamically significant stenosis. No dissection or occlusion. INTERNAL CAROTID ARTERIES: Mild atheromatous change about the carotid siphons without hemodynamically significant stenosis. No dissection or occlusion. VERTEBRAL ARTERIES: No significant stenosis. No dissection or occlusion. CTA HEAD: ANTERIOR CEREBRAL ARTERIES: No significant stenosis. No occlusion. No aneurysm. MIDDLE CEREBRAL ARTERIES: No significant stenosis. No occlusion. No aneurysm. POSTERIOR CEREBRAL ARTERIES: No significant stenosis. No occlusion. No aneurysm. BASILAR ARTERY: No significant stenosis. No occlusion. No aneurysm. OTHER: SOFT TISSUES: No acute finding. No masses or lymphadenopathy. BONES: Moderate multilevel cervical  spondylosis. Osteoarthritic changes noted about the right TMJ. No acute osseous abnormality. LUNGS: Centrilobular emphysema. Superimposed patchy opacity at the peripheral aspects of both upper lobes. While this may in part reflects scarring, superimposed changes of mild pneumonitis slash small airway disease could be contributory as well. IMPRESSION: 1. Negative CTA of the head and neck. No large vessel occlusion or other emergent finding. 2. Mild for age atheromatous disease about the carotid bulbs and carotid siphons without hemodynamically significant or correctable stenosis. 3. Negative noncontrast head CT. Previously questioned ischemic changes involving the right ACA distribution not appreciated on this exam. No other new acute intracranial abnormality. 4. Aortic atherosclerosis. Emphysema. 5. Patchy peripheral upper lobe opacities, likely scarring. Possible mild o changes needed pneumonitis or small airway disease could be considered in the correct clinical setting. Electronically signed by: Morene Hoard MD 10/28/2023 07:50 PM EDT RP Workstation: [id number]   CT HEAD CODE STROKE WO CONTRAST Result Date: 10/28/2023 EXAM: CT HEAD WITHOUT CONTRAST 10/28/2023 03:48:46 PM TECHNIQUE: CT of the head was performed without the administration of intravenous contrast. Automated exposure control, iterative reconstruction, and/or weight based adjustment of the mA/kV was utilized to reduce the radiation dose to as low as reasonably achievable. COMPARISON: CT head 06/02/2022. CLINICAL HISTORY: Neuro deficit, acute, stroke suspected. FINDINGS: BRAIN AND VENTRICLES: No acute hemorrhage. Loss of gray-white differentiation in right frontal lobe. Remote lacunar  infarct in left thalamus. Periventricular white matter changes, likely sequela of chronic small vessel ischemic disease. Atherosclerotic calcifications in intracranial carotid and vertebral arteries. No hydrocephalus. No extra-axial collection. No mass effect  or midline shift. ORBITS: Bilateral lens replacements noted. No acute abnormality. SINUSES: Air-fluid level in right maxillary sinus. Remote fracture of right lamina papyracea. SOFT TISSUES AND SKULL: No acute soft tissue abnormality. No skull fracture. [place] stroke program early CT (aspect) score: Ganglionic (caudate, ic, lentiform nucleus, insula, M1-m3): 7 Supraganglionic (m4-m6): 3 Total: 10 IMPRESSION: 1. Acute ischemic changes in the right frontal lobe with loss of gray-white differentiation. Findings concerning for acute cortical infarct in the right ACA territory. 2.  ASPECT score is 10. 3. Chronic small vessel ischemic disease. 4. Remote lacunar infarct in the left thalamus. 5. Findings messaged to Dr. Sallyann at 4:00PM on 10/28/23. Electronically signed by: Donnice Mania MD 10/28/2023 04:01 PM EDT RP Workstation: [id number]    Microbiology: Recent Results (from the past 240 hours)  SARS Coronavirus 2 by RT PCR (hospital order, performed in [facility] hospital lab) *cepheid single result test* Anterior Nasal Swab     Status: None   Collection Time: 11/13/23 10:35 AM   Specimen: Anterior Nasal Swab  Result Value Ref Range Status   SARS Coronavirus 2 by RT PCR NEGATIVE NEGATIVE Final    Comment: (NOTE) SARS-CoV-2 target nucleic acids are NOT DETECTED.  The SARS-CoV-2 RNA is generally detectable in upper and lower respiratory specimens during the acute phase of infection. The lowest concentration of SARS-CoV-2 viral copies this assay can detect is 250 copies / mL. A negative result does not preclude SARS-CoV-2 infection and should not be used as the sole basis for treatment or other patient management decisions.  A negative result may occur with improper specimen collection / handling, submission of specimen other than nasopharyngeal swab, presence of viral mutation(s) within the areas targeted by this assay, and inadequate number of viral copies (<250 copies / mL). A negative result must  be combined with clinical observations, patient history, and epidemiological information.  Fact Sheet for Patients:   roadlaptop.co.za  Fact Sheet for Healthcare Providers: http://kim-miller.com/  This test is not yet approved or  cleared by the [place]  FDA and has been authorized for detection and/or diagnosis of SARS-CoV-2 by FDA under an Emergency Use Authorization (EUA).  This EUA will remain in effect (meaning this test can be used) for the duration of the COVID-19 declaration under Section 564(b)(1) of the Act, 21 U.S.C. section 360bbb-3(b)(1), unless the authorization is terminated or revoked sooner.  Performed at [facility], [address] [place], [place] [address]      Labs: Basic Metabolic Panel: Recent Labs  Lab 11/13/23 1037 11/13/23 1040 11/13/23 1519 11/14/23 0724 11/15/23 0416 11/16/23 0422  NA 124* 126*  --  135 130* 134*  K 3.6 3.8  --  3.5 4.2 4.0  CL 92* 92*  --  106 103 105  CO2 23  --   --  22 22 21*  GLUCOSE 100* 100*  --  87 86 151*  BUN 9 8  --  5* <5* <5*  CREATININE 0.81 1.00  --  0.79 0.85 0.82  CALCIUM  8.9  --   --  8.1* 8.1* 9.0  MG  --   --  1.7  --   --  1.9  PHOS  --   --  2.4*  --   --   --    Liver Function  Tests: Recent Labs  Lab 11/13/23 1037 11/14/23 0724  AST 26 16  ALT 14 9  ALKPHOS 94 57  BILITOT 1.0 0.6  PROT 6.7 5.5*  ALBUMIN 3.9 3.1*   No results for input(s): LIPASE, AMYLASE in the last 168 hours. No results for input(s): AMMONIA in the last 168 hours. CBC: Recent Labs  Lab 11/13/23 1037 11/13/23 1040 11/14/23 0724 11/15/23 0416 11/16/23 0422  WBC 7.1  --  4.0 4.6 3.0*  NEUTROABS 3.9  --   --   --  2.0  HGB 10.0* 9.9* 8.9* 8.2* 9.0*  HCT 30.2* 29.0* 26.9* 25.2* 28.5*  MCV 84.8  --  87.9 88.1 87.4  PLT 149*  --  128* 107* 136*   Cardiac Enzymes: No results for input(s): CKTOTAL, CKMB, CKMBINDEX, TROPONINI in the last  168 hours. BNP: BNP (last 3 results) No results for input(s): BNP in the last 8760 hours.  ProBNP (last 3 results) Recent Labs    10/02/23 1542  PROBNP 242.0*    CBG: No results for input(s): GLUCAP in the last 168 hours.     Signed:  Toribio Hummer MD.  [facility] 11/17/2023, 1:21 PM

## 2023-11-17 NOTE — TOC Transition Note (Signed)
 Transition of Care [facility] - Discharge Note   Patient Details  Name: Michael [name] MRN: [id number] Date of Birth: 07/22/1932  Transition of Care [facility]) CM/SW Contact:  Tawni CHRISTELLA Eva, LCSW Phone Number: 11/17/2023, 12:56 PM   Clinical Narrative:    Pt to d/c back to independent living with home health service through [facility].Care management sign off.    Final next level of care: Home w Home Health Services Barriers to Discharge: Barriers Resolved   Patient Goals and CMS Choice Patient states their goals for this hospitalization and ongoing recovery are:: Resume home services   Choice offered to / list presented to : NA      Discharge Placement                    Patient and family notified of of transfer: 11/17/23  Discharge Plan and Services Additional resources added to the After Visit Summary for   In-house Referral: Clinical Social Work   Post Acute Care Choice: Home Health          DME Arranged: N/A DME Agency: NA       HH Arranged: PT, OT HH Agency: [facility] Date HH Agency Contacted: 11/16/23 Time HH Agency Contacted: 1131 Representative spoke with at [facility] Agency: [facility]  Social Drivers of Health (SDOH) Interventions SDOH Screenings   Food Insecurity: No Food Insecurity (11/13/2023)  Housing: Low Risk  (11/13/2023)  Transportation Needs: No Transportation Needs (11/13/2023)  Utilities: Not At Risk (11/13/2023)  Alcohol Screen: Low Risk  (06/22/2023)  Depression (PHQ2-9): Low Risk  (09/20/2023)  Financial Resource Strain: Low Risk  (06/22/2023)  Physical Activity: Sufficiently Active (06/22/2023)  Social Connections: Socially Integrated (11/13/2023)  Stress: No Stress Concern Present (06/22/2023)  Tobacco Use: Medium Risk (11/13/2023)  Health Literacy: Adequate Health Literacy (06/22/2023)     Readmission Risk Interventions     No data to display

## 2023-11-22 ENCOUNTER — Ambulatory Visit: Payer: Self-pay | Admitting: Internal Medicine

## 2023-12-25 ENCOUNTER — Ambulatory Visit: Payer: Self-pay

## 2023-12-25 NOTE — Telephone Encounter (Signed)
 This RN made x2 attempts to contact pt with no answer. A voicemail was left with call back number provided.    Copied from CRM [id number]. Topic: General - Other >> Dec 25, 2023  2:28 PM Nessti S wrote: Reason for CRM: pt daughter called to speak with pcp or nurse about pts mental status. She would like a cb soon as possible.

## 2023-12-26 NOTE — Telephone Encounter (Signed)
 Copied from CRM #[id number]. Topic: General - Other >> Dec 26, 2023 11:16 AM Mesmerise C wrote: Reason for CRM: Patient calling to speak to carla wouldn't give any details as to why but stated it was important checked chart seen NT tried to reach pt multiple times no response, transferred to [facility] spoke to Rocky stated won't get carla unless has reason and I advised of notes on chart but stated a crm needs to be sent would like a call back at [phone]

## 2023-12-27 ENCOUNTER — Telehealth: Payer: Self-pay | Admitting: Internal Medicine

## 2023-12-27 NOTE — Telephone Encounter (Signed)
 Copied from CRM #[id number]. Topic: General - Other >> Dec 26, 2023 11:16 AM Mesmerise C wrote: Reason for CRM: Patient calling to speak to carla wouldn't give any details as to why but stated it was important checked chart seen NT tried to reach pt multiple times no response, transferred to [facility] spoke to Rocky stated won't get carla unless has reason and I advised of notes on chart but stated a crm needs to be sent would like a call back at [phone]    New encounter created so letter would have correct heading.     Letter printed

## 2023-12-27 NOTE — Telephone Encounter (Signed)
See new phone note

## 2023-12-27 NOTE — Telephone Encounter (Signed)
 Attempted to reach patient but cellphone VM is full and I am unable to leave a message.  If he calls back please see what he is needing.  Thanks!

## 2023-12-27 NOTE — Telephone Encounter (Signed)
 Letter completed and [name] notified.  Letter taken upfront for pick-up.  [name] or Dick (husband) to pick up note.

## 2023-12-28 ENCOUNTER — Telehealth: Payer: Self-pay

## 2023-12-28 NOTE — Telephone Encounter (Signed)
 Note completed and placed in [place] folder to sign so I can email it.

## 2023-12-28 NOTE — Telephone Encounter (Signed)
 Copied from CRM #[id number]. Topic: General - Other >> Dec 27, 2023  1:31 PM Mesmerise C wrote: Reason for CRM: Patient returning a call for Tobias asked patient what is he was needing to speak to her about patient stated he will tell her, called [facility] spoke to erin stated she's back in the clinic, told patient she attempted to call but vm was full patient stated paperwork needs to be sent to [facility] [address] so he can get the clearance he's needing can be called back if has questions

## 2024-01-01 ENCOUNTER — Telehealth: Payer: Self-pay

## 2024-01-01 ENCOUNTER — Other Ambulatory Visit (HOSPITAL_COMMUNITY): Payer: Self-pay | Admitting: Gastroenterology

## 2024-01-01 ENCOUNTER — Encounter: Payer: Self-pay | Admitting: Gastroenterology

## 2024-01-01 ENCOUNTER — Ambulatory Visit (INDEPENDENT_AMBULATORY_CARE_PROVIDER_SITE_OTHER): Admitting: Gastroenterology

## 2024-01-01 VITALS — BP 126/70 | HR 64 | Ht 71.0 in | Wt 160.0 lb

## 2024-01-01 DIAGNOSIS — Z7901 Long term (current) use of anticoagulants: Secondary | ICD-10-CM | POA: Diagnosis not present

## 2024-01-01 DIAGNOSIS — R053 Chronic cough: Secondary | ICD-10-CM | POA: Diagnosis not present

## 2024-01-01 DIAGNOSIS — D649 Anemia, unspecified: Secondary | ICD-10-CM

## 2024-01-01 DIAGNOSIS — K219 Gastro-esophageal reflux disease without esophagitis: Secondary | ICD-10-CM | POA: Diagnosis not present

## 2024-01-01 DIAGNOSIS — R1310 Dysphagia, unspecified: Secondary | ICD-10-CM

## 2024-01-01 DIAGNOSIS — K5641 Fecal impaction: Secondary | ICD-10-CM

## 2024-01-01 DIAGNOSIS — Z860100 Personal history of colon polyps, unspecified: Secondary | ICD-10-CM | POA: Diagnosis not present

## 2024-01-01 DIAGNOSIS — I480 Paroxysmal atrial fibrillation: Secondary | ICD-10-CM

## 2024-01-01 DIAGNOSIS — R059 Cough, unspecified: Secondary | ICD-10-CM

## 2024-01-01 DIAGNOSIS — I4891 Unspecified atrial fibrillation: Secondary | ICD-10-CM

## 2024-01-01 DIAGNOSIS — K50012 Crohn's disease of small intestine with intestinal obstruction: Secondary | ICD-10-CM | POA: Diagnosis not present

## 2024-01-01 DIAGNOSIS — K50019 Crohn's disease of small intestine with unspecified complications: Secondary | ICD-10-CM

## 2024-01-01 NOTE — Patient Instructions (Signed)
 You have been scheduled for a modified barium swallow on Wednesday, 01/31/24 at 11:00 am. Please arrive 15 minutes prior to your test for registration. You will go to [facility] (1st Floor) for your appointment. Should you need to cancel or reschedule your appointment, please contact [phone] Clora Leonia) or [phone] Geroge Long). _____________________________________________________________________ A Modified Barium Swallow Study, or MBS, is a special x-ray that is taken to check swallowing skills. It is carried out by a Marine Scientist and a Warehouse Manager (SLP). During this test, yourmouth, throat, and esophagus, a muscular tube which connects your mouth to your stomach, is checked. The test will help you, your doctor, and the SLP plan what types of foods and liquids are easier for you to swallow. The SLP will also identify positions and ways to help you swallow more easily and safely. What will happen during an MBS? You will be taken to an x-ray room and seated comfortably. You will be asked to swallow small amounts of food and liquid mixed with barium. Barium is a liquid or paste that allows images of your mouth, throat and esophagus to be seen on x-ray. The x-ray captures moving images of the food you are swallowing as it travels from your mouth through your throat and into your esophagus. This test helps identify whether food or liquid is entering your lungs (aspiration). The test also shows which part of your mouth or throat lacks strength or coordination to move the food or liquid in the right direction. This test typically takes 30 minutes to 1 hour to complete. _______________________________________________________________________  Thank you for trusting me with your gastrointestinal care!   Nestor Blower, PA  _______________________________________________________  If your blood pressure at your visit was 140/90 or greater, please contact your primary care  physician to follow up on this.  _______________________________________________________  If you are age 88 or older, your body mass index should be between 23-30. Your Body mass index is 22.32 kg/m. If this is out of the aforementioned range listed, please consider follow up with your Primary Care Provider.  If you are age 88 or younger, your body mass index should be between 19-25. Your Body mass index is 22.32 kg/m. If this is out of the aformentioned range listed, please consider follow up with your Primary Care Provider.   ________________________________________________________  The [facility] providers would like to encourage you to use MYCHART to communicate with providers for non-urgent requests or questions.  Due to long hold times on the telephone, sending your provider a message by [facility] may be a faster and more efficient way to get a response.  Please allow 48 business hours for a response.  Please remember that this is for non-urgent requests.  _______________________________________________________  [facility] is using a team-based approach to care.  Your team is made up of your doctor and two to three APPS. Our APPS (Nurse Practitioners and Physician Assistants) work with your physician to ensure care continuity for you. They are fully qualified to address your health concerns and develop a treatment plan. They communicate directly with your gastroenterologist to care for you. Seeing the Advanced Practice Practitioners on your physician's team can help you by facilitating care more promptly, often allowing for earlier appointments, access to diagnostic testing, procedures, and other specialty referrals.

## 2024-01-01 NOTE — Progress Notes (Signed)
 Agree with assessment and plan as outlined.  Patient has known Crohn's disease of the small bowel.  He has been treated with budesonide  as needed as he has declined Biologics in the past.  If he has symptoms from his Crohn's I would continue to use budesonide  as needed.  Sounds like he does not want treatment for right now despite his CT scan.  If he does wish to pursue therapy I would give him budesonide  9 mg daily for 30 days and then slow taper  Bayley - I would recommend he come back to the office for CBC, CMET, and iron studies to make sure okay, given his anemia in the hospital last month.  Of note, he has had too numerous to count colon polyps in the past and underwent significant amount of colonoscopy with me in recent years.  We have decided to hold off on further colonoscopy exams given his age and comorbidities.  Hopefully he can stay on a strong bowel regimen to prevent recurrent impaction moving forward.

## 2024-01-01 NOTE — Progress Notes (Signed)
 I spoke to patient and ordered his labs.  Please see phone note.

## 2024-01-01 NOTE — Telephone Encounter (Signed)
 I spoke to Michael [name] and I advised him that Bayley spoke to [name] about his visit today and he would like for him to have some labs drawn due to his dx of anemia at his last hospital visit.  I explained that he will just come back to our building but go downstairs to the basement for the lab draw.  Patient agreed with the plan of care.

## 2024-01-01 NOTE — Progress Notes (Signed)
 "  Chief Complaint: hospital follow up Primary GI MD:[name]  HPI: 89 y.o. male with medical history significant of adrenal insufficiency, unspecified anemia, paroxysmal atrial fibrillation, history of DVT, colon polyps, Crohn's disease, glaucoma, pseudophakia of both sides, vitreous prolapse of the right eye, recurrent bilateral inguinal hernia, male hypogonadism, osteopenia, retinal ischemia, B12 deficiency, vitamin D  deficiency, history of pneumonia, thrombocytopenia, history of encephalitis, history of seizure-like activity, small bowel obstruction, history of TIA, hypertension, hyperlipidemia, aortic atherosclerosis, mild cognitive impairment, overactive bladder, bilateral hearing loss presents for hospital follow up  Crohn's history: Previously followed by [name]. 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 He has had 2 operations for his Crohns in 1968 and then again 2001 or so for small bowel resections. He remotely was treated with prednisone  as needed in the past. He was never been on any other maintenance therapy for Crohn's disease other than prednisone  and surgery (has declined biologic therapy). He has had hospitalizations in the past for bowel obstructions. Unfortunately he has suffered complications from chronic steroid use. He is currently being followed by Endocrinology for adrenal insufficiency. He has also been noted to have hepatic steatosis and avascular necrosis of the hips on CT scan. His course has also been complicated by history of diverticulosis related bleeding. Most recently has been using budesonide  for flares as needed, and noted to have numerous colon polyps on multiple colonoscopy exams  SINCE LAST VISIT 2024 with [name]:  Recent hospitalization 11/13/2023 to 11/17/2023 after presenting to [name] with nausea, vomiting, diarrhea.  CTAP consistent with s/p terminal ileocolectomy and ileocolic reanastomosis, long segment wall thickening,  mucosal hyperenhancement, and narrowing of the tethered appearing remnant distal ileum, affecting a segment at least 40 cm in length consistent with active inflammatory bowel disease, no evidence of obstruction and circumferential wall thickening of the low rectum suggestive of proctitis.   Patient was found to have fecal impaction of the rectum with overflow incontinence.  He was taking 2-6 Imodium  tablets over the last 6 months.  Was given smog enema x 2 with liquid stools and ultimately underwent flexible sigmoidoscopy as below and was discharged on MiraLAX .  He also underwent EGD with dilation for dysphagia.   EGD 11/15/2023 ([name] inpatient) - Benign- appearing esophageal stenosis. Dilated.  - A single gastric polyp. Resected and retrieved.  - The examination was otherwise normal.  Colonoscopy 11/2023 ([name] for fecal impaction) - Preparation of the colon was poor. ( unprepped)  - Soft fecal impaction found on digital rectal exam.  - Stool in the rectum and in the recto- sigmoid colon.  - No specimens collected.   Discussed the use of AI scribe software for clinical note transcription with the patient, who gave verbal consent to proceed.  History of Present Illness  Michael [name] is an 89 year old male with Crohn's disease and prior esophageal dilation who presents for follow-up of gastrointestinal symptoms and bowel regimen.  Recently hospitalized for constipation and started on Miralax . The week of hospitalization was described as rough, but since discharge, he feels good overall. Denies abdominal pain, nausea, or vomiting.  Bowel movements occur one to two times per day and are mostly liquid. Uncertain if currently taking Miralax  and does not recall details of his medication regimen. Denies use of Imodium  and expresses concern about recurrent constipation, referencing prior prolonged use of Imodium  that led to constipation.  History of esophageal dilation with  subsequent increased coughing. Cough occurs at various times, sometimes with  eating or drinking, but not consistently. Patient is unsure if his cough is related to his esophageal dilation and has not discussed this symptom with his primary care provider.  Lives alone with assistance from a caregiver for daily activities and transportation. Daughter is involved in care and medication management.   PREVIOUS GI WORKUP   Colonoscopy 12/2015 - [name] - normal colon, patent ileocolonic anastomosis, active Crohn's ileitis   Colonoscopy 01/24/2019  - Preparation of the colon was fair. - Crohn's disease with mild ileitis. - Patent end-to-side ileo-colonic anastomosis, characterized by mild stenosis. Biopsied. - Abnormal mucosa in the ascending colon. Biopsied. - Two 3 to 6 mm polyps in the ascending colon, removed with a cold snare. Resected and retrieved. - Eight 4 to 12 mm polyps in the transverse colon, removed with a cold snare. Resected and retrieved. - One large polyp in the transverse colon. Tattooed. Not removed as outlined above - One 10 mm polyp at the splenic flexure, removed with a cold snare. Resected and retrieved. - One 10 mm polyp in the descending colon, removed with a cold snare. Resected and retrieved. - Five 5 to 10 mm polyps in the sigmoid colon, removed with a cold snare. Resected and retrieved. - Two 4 mm polyps polyps at the recto-sigmoid colon, removed with a cold snare. Resected and retrieved. - Diverticulosis in the entire examined colon. - Stool in the entire examined colon. - Colonic spasm. - The examination was otherwise normal.   Most polyps adenomatous Biopsies of right sided abnormality adenomatous   Colonoscopy 03/25/19  - Preparation of the colon was fair with significant spasm which prolonged this exam - Active ileal Crohn's disease as described - [name] i2. - Patent end-to-side ileo-colonic anastomosis, characterized by inflammation. - Numerous  flat polyps noted in the ascending colon and proximal transverse colon as noted. Several minutes spent lavaging the colon and methylene blue  used to evaluate the right colon to better delineate some of these especially the largest lesion which may extend into the surgical anastomosis. Largest transverse polyp removed as outlined, with another large transverse polyp noted today which was not removed given duration of procedure and colonic spasm.   FINAL MICROSCOPIC DIAGNOSIS:   A. COLON, RIGHT, POLYPECTOMY:  - Tubular adenoma (x2 fragments).  - No high grade dysplasia or malignancy.   B. COLON, RIGHT NEAR ANASTAMOSIS, POLYPECTOMY:  - Tubular adenoma (multiple fragments).  - No high grade dysplasia or malignancy.   C. COLON, PROXIMAL TRANSVERSE, POLYPECTOMY:  - Tubular adenoma (multiple fragments).  - No high grade dysplasia or malignancy.   D. COLON, TRANSVERSE, LARGE, POLYPECTOMY:  - Tubular adenoma (multiple fragments).  - No high grade dysplasia or malignancy.      Colonoscopy 08/26/19 - Crohn's disease with mildly active ileitis. - Patent end-to-side ileo-colonic anastomosis. - Prior polypectomy site in the ascending colon, unclear if residual polyp vs. normal variant ileal tissue given location at the anatomosis. Area removed piecemeal using a cold snare. Resected and retrieved. - Chromoendoscopy applied to the proximal transverse and right colon. - Two 5 to 20 mm polyps in the transverse colon, removed piecemeal using a cold snare. Resected and retrieved. - Diverticulosis in the transverse colon and in the left colon. - The examination was otherwise normal. - Spatic colon treated with glucagon . Overall, very challenging exam, time needed to clear the colon, visualization difficult given spasm. I think the colon at this point has been cleared of all high risk lesions however difficult to say if  there is residual / recurrence at the polyp site near the anastomosis.  Under normal circumstances surgery would be recommended to remove right colon and anastomosis however given patient's age he has elected for colonoscopy surveillance.    FINAL MICROSCOPIC DIAGNOSIS:   A. COLON, RIGHT, SNARE POLYPECTOMY:  - Tubular adenoma(s)  - Negative for high-grade dysplasia or malignancy   B. COLON, TRANSVERSE, SNARE POLYPECTOMY:  - Tubular adenoma(s)  - Negative for high-grade dysplasia or malignancy   C. COLON, TRANSVERSE, SNARE POLYPECTOMY:  - Tubular adenoma(s)  - Negative for high-grade dysplasia or malignancy     Colonoscopy 03/19/20 - - Crohn's disease with mildly active ileitis. No overt polypoid tissue at the entrance of the ileum but multiple biopsies taken. - Patent end-to-end ileo-colonic anastomosis. - ? polypoid lesion in the ascending colon just distal to the anastomosis, in close approximation, vs. scar tissue or normal variant as above, very difficult to see clear borders. Area was removed with cold snare, - Diverticulosis in the transverse colon and in the left colon. - The examination was otherwise normal. No high risk lesions otherwise appreciated.   FINAL MICROSCOPIC DIAGNOSIS:   A. COLON, ASCENDING, POLYPECTOMY:  - Fragments of polypoid colonic mucosa showing low-grade dysplasia with  a tubular architecture.  See comment  - Inflammatory polyp  - Other fragments of polypoid colonic mucosa with no specific  histopathologic changes   B. SURGICAL ANASTAMOSIS, BIOPSY:  - Colonic mucosa with nonspecific inflammatory and architectural  changes, consistent with anastomotic site  - Negative for granulomas or dysplasia   EGD 11/15/2023 ([name] inpatient) - Benign- appearing esophageal stenosis. Dilated.  - A single gastric polyp. Resected and retrieved.  - The examination was otherwise normal.  Colonoscopy 11/2023 ([name] for fecal impaction) - Preparation of the colon was poor. ( unprepped)  - Soft fecal impaction found on  digital rectal exam.  - Stool in the rectum and in the recto- sigmoid colon.  - No specimens collected.   Echo 09/23/20 - EF 55-60%, grade I DD  Past Medical History:  Diagnosis Date   Adrenal insufficiency    Anemia    Atrial fibrillation (HCC)    Avascular necrosis (HCC)    Clavicle fracture 11/02/2017   Colon polyp    Colon polyps    Crohn's disease (HCC)    Glaucoma    Inguinal hernia recurrent bilateral    Low testosterone     Osteopenia    Retinal ischemia     Past Surgical History:  Procedure Laterality Date   ABDOMINAL SURGERY     BIOPSY  03/19/2020   Procedure: BIOPSY;  Surgeon: Leigh Elspeth SQUIBB, MD;  Location: [facility];  Service: Gastroenterology;;   COLONOSCOPY N/A 12/13/2015   Procedure: COLONOSCOPY;  Surgeon: Lynwood Bohr, MD;  Location: [facility];  Service: Endoscopy;  Laterality: N/A;   COLONOSCOPY WITH PROPOFOL  N/A 03/25/2019   Procedure: COLONOSCOPY WITH PROPOFOL ;  Surgeon: Leigh Elspeth SQUIBB, MD;  Location: [facility];  Service: Gastroenterology;  Laterality: N/A;   COLONOSCOPY WITH PROPOFOL  N/A 08/26/2019   Procedure: COLONOSCOPY WITH PROPOFOL ;  Surgeon: Leigh Elspeth SQUIBB, MD;  Location: [facility];  Service: Gastroenterology;  Laterality: N/A;   COLONOSCOPY WITH PROPOFOL  N/A 03/19/2020   Procedure: COLONOSCOPY WITH PROPOFOL ;  Surgeon: Leigh Elspeth SQUIBB, MD;  Location: [facility];  Service: Gastroenterology;  Laterality: N/A;   ENDOSCOPIC MUCOSAL RESECTION N/A 08/26/2019   Procedure: ENDOSCOPIC MUCOSAL RESECTION;  Surgeon: Leigh Elspeth SQUIBB, MD;  Location: [facility];  Service: Gastroenterology;  Laterality: N/A;   ESOPHAGOGASTRODUODENOSCOPY N/A 11/15/2023   Procedure: EGD (ESOPHAGOGASTRODUODENOSCOPY);  Surgeon: Avram Lupita BRAVO, MD;  Location: [facility];  Service: Gastroenterology;  Laterality: N/A;   FLEXIBLE SIGMOIDOSCOPY N/A 11/15/2023   Procedure: KINGSTON SIDE;  Surgeon: Avram Lupita BRAVO, MD;  Location: [facility];   Service: Gastroenterology;  Laterality: N/A;  Fecal disimpaction under sedation   INGUINAL HERNIA REPAIR Bilateral 05/21/2014   Procedure: OPEN BILATERAL INGUINAL HERNIA REPAIRS WITH MESH;  Surgeon: Donnice Lima, MD;  Location: [facility];  Service: General;  Laterality: Bilateral;   POLYPECTOMY  03/25/2019   Procedure: POLYPECTOMY;  Surgeon: Leigh Elspeth SQUIBB, MD;  Location: [facility];  Service: Gastroenterology;;   POLYPECTOMY  08/26/2019   Procedure: POLYPECTOMY;  Surgeon: Leigh Elspeth SQUIBB, MD;  Location: [facility];  Service: Gastroenterology;;   POLYPECTOMY  03/19/2020   Procedure: POLYPECTOMY;  Surgeon: Leigh Elspeth SQUIBB, MD;  Location: [facility];  Service: Gastroenterology;;   WALDEMAR  08/26/2019   Procedure: Sprayed methylene blue ;  Surgeon: Leigh Elspeth SQUIBB, MD;  Location: [facility];  Service: Gastroenterology;;   SMALL INTESTINE SURGERY  1968, 2001    related to Chrohn's disease   TONSILLECTOMY      Current Outpatient Medications  Medication Sig Dispense Refill   acetaminophen  (TYLENOL ) 500 MG tablet Take 500-1,000 mg by mouth every 8 (eight) hours as needed for mild pain or headache.      apixaban  (ELIQUIS ) 5 MG TABS tablet Take 1 tablet (5 mg total) by mouth 2 (two) times daily. 60 tablet 5   Ascorbic Acid  (VITAMIN C ) 1000 MG tablet Take 1,000 mg by mouth daily with breakfast.      augmented betamethasone dipropionate (DIPROLENE-AF) 0.05 % cream Apply 1 application  topically 2 (two) times daily as needed (Grover's disease- legs).     B-D 3CC LUER-LOK SYR 25GX1 25G X 1 3 ML MISC USE MONTHLY FOR B-12 INJECTIONS 12 each 0   brimonidine  (ALPHAGAN ) 0.2 % ophthalmic solution Place 1 drop into the left eye in the morning and at bedtime.     Cholecalciferol  (VITAMIN D3) 50 MCG (2000 UT) TABS Take 2,000 Units by mouth daily with breakfast.     cyanocobalamin  (VITAMIN B12) 1000 MCG/ML injection INJECT 1ML INTO THE MUSCLE ONCE MONTHLY 10 mL 0    [Paused] desmopressin  (DDAVP ) 0.1 MG tablet Take 0.1 mg by mouth at bedtime.     dorzolamide -timolol  (COSOPT ) 22.3-6.8 MG/ML ophthalmic solution Place 1 drop into the left eye in the morning and at bedtime.     latanoprost  (XALATAN ) 0.005 % ophthalmic solution Place 1 drop into the left eye at bedtime.     levETIRAcetam  (KEPPRA ) 750 MG tablet Take 1 tablet (750 mg total) by mouth 2 (two) times daily. 180 tablet 0   loperamide  (IMODIUM  A-D) 2 MG tablet Take 2 mg by mouth daily as needed for diarrhea or loose stools.     metoprolol  succinate (TOPROL -XL) 25 MG 24 hr tablet Take 0.5 tablets (12.5 mg total) by mouth daily. 45 tablet 3   Netarsudil  Dimesylate 0.02 % SOLN Place 1 drop into the left eye at bedtime.     [Paused] omeprazole  (PRILOSEC ) 20 MG capsule Take 20 mg by mouth daily as needed (for heartburn).     pantoprazole  (PROTONIX ) 40 MG tablet Take 1 tablet (40 mg total) by mouth daily before breakfast. 90 tablet 0   polyethylene glycol (MIRALAX  / GLYCOLAX ) 17 g packet Take 17 g by mouth daily.     pyridOXINE  (B-6)  50 MG tablet Take 1 tablet (50 mg total) by mouth daily. 90 tablet 0   sertraline  (ZOLOFT ) 25 MG tablet Take 1 tablet (25 mg total) by mouth daily. 30 tablet 3   TUMS 500 MG chewable tablet Chew 1 tablet by mouth 3 (three) times daily as needed for indigestion or heartburn.     No current facility-administered medications for this visit.    Allergies as of 01/01/2024   (No Known Allergies)    Family History  Problem Relation Age of Onset   Deep vein thrombosis Mother    Transient ischemic attack Mother    Heart disease Father    Cancer Maternal Aunt        unknown type; dx after 83   Cancer Cousin        maternal cousin; unknown type   Cancer Cousin        paternal cousin; unknown type; dx after 50   Stomach cancer Neg Hx    Colon cancer Neg Hx    Esophageal cancer Neg Hx    Pancreatic cancer Neg Hx    Rectal cancer Neg Hx    Colon polyps Neg Hx     Social  History   Socioeconomic History   Marital status: Married    Spouse name: Not on file   Number of children: 3   Years of education: Not on file   Highest education level: Not on file  Occupational History   Occupation: Retired  Tobacco Use   Smoking status: Former    Current packs/day: 0.00    Types: Cigarettes    Quit date: 04/01/1976    Years since quitting: 47.7   Smokeless tobacco: Never  Vaping Use   Vaping status: Never Used  Substance and Sexual Activity   Alcohol use: Yes    Alcohol/week: 2.0 standard drinks of alcohol    Types: 2 Glasses of wine per week    Comment: social   Drug use: No   Sexual activity: Not Currently  Other Topics Concern   Not on file  Social History Narrative   Not on file   Social Drivers of Health   Tobacco Use: Medium Risk (01/01/2024)   Patient History    Smoking Tobacco Use: Former    Smokeless Tobacco Use: Never    Passive Exposure: Not on file  Financial Resource Strain: Low Risk (06/22/2023)   Overall Financial Resource Strain (CARDIA)    Difficulty of Paying Living Expenses: Not hard at all  Food Insecurity: No Food Insecurity (11/13/2023)   Epic    Worried About Programme Researcher, Broadcasting/film/video in the Last Year: Never true    Ran Out of Food in the Last Year: Never true  Transportation Needs: No Transportation Needs (11/13/2023)   Epic    Lack of Transportation (Medical): No    Lack of Transportation (Non-Medical): No  Physical Activity: Sufficiently Active (06/22/2023)   Exercise Vital Sign    Days of Exercise per Week: 7 days    Minutes of Exercise per Session: 30 min  Stress: No Stress Concern Present (06/22/2023)   [employer] of Occupational Health - Occupational Stress Questionnaire    Feeling of Stress: Not at all  Social Connections: Socially Integrated (11/13/2023)   Social Connection and Isolation Panel    Frequency of Communication with Friends and Family: More than three times a week    Frequency of Social Gatherings  with Friends and Family: More than three times a week  Attends Religious Services: More than 4 times per year    Active Member of Clubs or Organizations: Yes    Attends Banker Meetings: More than 4 times per year    Marital Status: Married  Catering Manager Violence: Not At Risk (11/13/2023)   Epic    Fear of Current or Ex-Partner: No    Emotionally Abused: No    Physically Abused: No    Sexually Abused: No  Depression (PHQ2-9): Low Risk (09/20/2023)   Depression (PHQ2-9)    PHQ-2 Score: 2  Alcohol Screen: Low Risk (06/22/2023)   Alcohol Screen    Last Alcohol Screening Score (AUDIT): 0  Housing: Low Risk (11/13/2023)   Epic    Unable to Pay for Housing in the Last Year: No    Number of Times Moved in the Last Year: 0    Homeless in the Last Year: No  Utilities: Not At Risk (11/13/2023)   Epic    Threatened with loss of utilities: No  Health Literacy: Adequate Health Literacy (06/22/2023)   B1300 Health Literacy    Frequency of need for help with medical instructions: Never    Review of Systems:    Constitutional: No weight loss, fever, chills, weakness or fatigue HEENT: Eyes: No change in vision               Ears, Nose, Throat:  No change in hearing or congestion Skin: No rash or itching Cardiovascular: No chest pain, chest pressure or palpitations   Respiratory: No SOB or cough Gastrointestinal: See HPI and otherwise negative Genitourinary: No dysuria or change in urinary frequency Neurological: No headache, dizziness or syncope Musculoskeletal: No new muscle or joint pain Hematologic: No bleeding or bruising Psychiatric: No history of depression or anxiety    Physical Exam:  Vital signs: BP 126/70   Pulse 64   Ht 5' 11 (1.803 m)   Wt 160 lb (72.6 kg)   BMI 22.32 kg/m   Constitutional: NAD, alert and cooperative Head:  Normocephalic and atraumatic. Eyes:   PEERL, EOMI. No icterus. Conjunctiva pink. Respiratory: Respirations even and unlabored.  Lungs clear to auscultation bilaterally.   No wheezes, crackles, or rhonchi.  Cardiovascular:  Regular rate and rhythm. No peripheral edema, cyanosis or pallor.  Gastrointestinal:  Soft, nondistended, nontender. No rebound or guarding. Normal bowel sounds. No appreciable masses or hepatomegaly. Rectal:  Declines Msk:  Symmetrical without gross deformities. Without edema, no deformity or joint abnormality.  Neurologic:  Alert and  oriented x4;  grossly normal neurologically.  Skin:   Dry and intact without significant lesions or rashes. Psychiatric: Oriented to person, place and time. Demonstrates good judgement and reason without abnormal affect or behaviors.  Physical Exam    RELEVANT LABS AND IMAGING: CBC    Component Value Date/Time   WBC 3.0 (L) 11/16/2023 0422   RBC 3.26 (L) 11/16/2023 0422   HGB 9.0 (L) 11/16/2023 0422   HCT 28.5 (L) 11/16/2023 0422   PLT 136 (L) 11/16/2023 0422   MCV 87.4 11/16/2023 0422   MCH 27.6 11/16/2023 0422   MCHC 31.6 11/16/2023 0422   RDW 19.0 (H) 11/16/2023 0422   LYMPHSABS 0.7 11/16/2023 0422   MONOABS 0.1 11/16/2023 0422   EOSABS 0.0 11/16/2023 0422   BASOSABS 0.0 11/16/2023 0422    CMP     Component Value Date/Time   NA 134 (L) 11/16/2023 0422   NA 138 10/04/2016 0000   K 4.0 11/16/2023 0422   CL 105 11/16/2023 0422  CO2 21 (L) 11/16/2023 0422   GLUCOSE 151 (H) 11/16/2023 0422   BUN <5 (L) 11/16/2023 0422   BUN 13 10/04/2016 0000   CREATININE 0.82 11/16/2023 0422   CALCIUM  9.0 11/16/2023 0422   PROT 5.5 (L) 11/14/2023 0724   ALBUMIN 3.1 (L) 11/14/2023 0724   AST 16 11/14/2023 0724   ALT 9 11/14/2023 0724   ALKPHOS 57 11/14/2023 0724   BILITOT 0.6 11/14/2023 0724   GFRNONAA >60 11/16/2023 0422   GFRAA >60 09/11/2019 1315     Assessment/Plan:   89 year old male with Crohn's disease initially diagnosed in 1966 with fibrostenosing ileal Crohn's disease s/p surgery in 1968 and 2001 for small bowel resections presents for  hospital follow-up of nausea, vomiting, diarrhea  Fecal impaction with overflow ED Imodium  over the last 6 months  CTAP consistent with s/p terminal ileocolectomy and ileocolic reanastomosis, active IBD. persistent impaction despite 2 smog enemas.   Flex sig 11/5 with soft stool causing fecal impaction throughout rectosigmoid colon He is unsure if he is on miralax , having some liquid/soft stools but feels like a million bucks. Adamantly declines treatment for crohn's despite active IBD. Alone today and somewhat confused. Left daughter a message to discuss our visit. Difficult situation -- continue miralax  daily -- will call daughter again to discuss plan -- discuss crohn's treatment recommendation with [name] as he previously had this discussion with him last year in which he adamantly declined biologic therapy despite active disease with he declines this again today but was alone and somewhat confused  Chronic iron deficiency anemia likely secondary to Crohn's No overt bleeding. Iron 27. Saturation ratios 13. TIBC 207. Ferritin 160.   GERD Dysphagia Chest CT with fluid and debris-filled esophagus to the level of thoracic inlet without visible mass or obstruction.  Speech pathology exam today and the patient confusion.  EGD 11/5 with stenosis s/p dilation and single gastric polyp.  Patient reports persistent cough since EGD. - Repeat MBS  A-fib On Eliquis   History of colon polyps, colonoscopy 03/2020 identified a polypoid lesion in the ascending colon, path report showed fragments of polypoid colonic mucosa with low-grade dysplasia with a tubular architecture and an inflammatory polyp. -No further no further surveillance colonoscopies recommended due to age   Nestor Mollie DEVONNA [facility] 01/01/2024, 12:21 PM  Cc: [name], MD "

## 2024-01-17 ENCOUNTER — Other Ambulatory Visit: Payer: Self-pay | Admitting: Internal Medicine

## 2024-01-18 ENCOUNTER — Telehealth (HOSPITAL_COMMUNITY): Payer: Self-pay

## 2024-01-18 NOTE — Telephone Encounter (Signed)
 OP MBS appointment cancelled - (Per pt's spouse- cxl appt-said pt doesn't need it anymore). MZAMORA

## 2024-01-22 ENCOUNTER — Inpatient Hospital Stay (HOSPITAL_COMMUNITY)
Admission: EM | Admit: 2024-01-22 | Discharge: 2024-01-25 | DRG: 194 | Disposition: A | Discharge status: Home Health | Attending: Internal Medicine | Admitting: Internal Medicine

## 2024-01-22 ENCOUNTER — Emergency Department (HOSPITAL_COMMUNITY)

## 2024-01-22 ENCOUNTER — Other Ambulatory Visit: Payer: Self-pay

## 2024-01-22 ENCOUNTER — Ambulatory Visit: Payer: Self-pay

## 2024-01-22 DIAGNOSIS — I480 Paroxysmal atrial fibrillation: Secondary | ICD-10-CM | POA: Diagnosis present

## 2024-01-22 DIAGNOSIS — R42 Dizziness and giddiness: Principal | ICD-10-CM

## 2024-01-22 DIAGNOSIS — I10 Essential (primary) hypertension: Secondary | ICD-10-CM | POA: Diagnosis present

## 2024-01-22 DIAGNOSIS — I700 Atherosclerosis of aorta: Secondary | ICD-10-CM | POA: Diagnosis present

## 2024-01-22 DIAGNOSIS — E785 Hyperlipidemia, unspecified: Secondary | ICD-10-CM | POA: Diagnosis present

## 2024-01-22 DIAGNOSIS — E538 Deficiency of other specified B group vitamins: Secondary | ICD-10-CM | POA: Diagnosis present

## 2024-01-22 DIAGNOSIS — K317 Polyp of stomach and duodenum: Secondary | ICD-10-CM | POA: Diagnosis present

## 2024-01-22 DIAGNOSIS — Z961 Presence of intraocular lens: Secondary | ICD-10-CM | POA: Diagnosis present

## 2024-01-22 DIAGNOSIS — Z7901 Long term (current) use of anticoagulants: Secondary | ICD-10-CM

## 2024-01-22 DIAGNOSIS — K635 Polyp of colon: Secondary | ICD-10-CM | POA: Diagnosis present

## 2024-01-22 DIAGNOSIS — K5090 Crohn's disease, unspecified, without complications: Secondary | ICD-10-CM | POA: Diagnosis present

## 2024-01-22 DIAGNOSIS — Z8673 Personal history of transient ischemic attack (TIA), and cerebral infarction without residual deficits: Secondary | ICD-10-CM

## 2024-01-22 DIAGNOSIS — D638 Anemia in other chronic diseases classified elsewhere: Secondary | ICD-10-CM | POA: Diagnosis present

## 2024-01-22 DIAGNOSIS — H9193 Unspecified hearing loss, bilateral: Secondary | ICD-10-CM | POA: Diagnosis present

## 2024-01-22 DIAGNOSIS — R195 Other fecal abnormalities: Secondary | ICD-10-CM

## 2024-01-22 DIAGNOSIS — Z86718 Personal history of other venous thrombosis and embolism: Secondary | ICD-10-CM

## 2024-01-22 DIAGNOSIS — Z8661 Personal history of infections of the central nervous system: Secondary | ICD-10-CM

## 2024-01-22 DIAGNOSIS — E860 Dehydration: Secondary | ICD-10-CM | POA: Diagnosis present

## 2024-01-22 DIAGNOSIS — E8809 Other disorders of plasma-protein metabolism, not elsewhere classified: Secondary | ICD-10-CM | POA: Diagnosis present

## 2024-01-22 DIAGNOSIS — H832X9 Labyrinthine dysfunction, unspecified ear: Secondary | ICD-10-CM | POA: Diagnosis present

## 2024-01-22 DIAGNOSIS — Z8249 Family history of ischemic heart disease and other diseases of the circulatory system: Secondary | ICD-10-CM

## 2024-01-22 DIAGNOSIS — D649 Anemia, unspecified: Secondary | ICD-10-CM

## 2024-01-22 DIAGNOSIS — Z9049 Acquired absence of other specified parts of digestive tract: Secondary | ICD-10-CM

## 2024-01-22 DIAGNOSIS — E2740 Unspecified adrenocortical insufficiency: Secondary | ICD-10-CM | POA: Diagnosis present

## 2024-01-22 DIAGNOSIS — K5080 Crohn's disease of both small and large intestine without complications: Secondary | ICD-10-CM | POA: Diagnosis present

## 2024-01-22 DIAGNOSIS — G3184 Mild cognitive impairment, so stated: Secondary | ICD-10-CM | POA: Diagnosis present

## 2024-01-22 DIAGNOSIS — G40909 Epilepsy, unspecified, not intractable, without status epilepticus: Secondary | ICD-10-CM

## 2024-01-22 DIAGNOSIS — J189 Pneumonia, unspecified organism: Principal | ICD-10-CM | POA: Diagnosis present

## 2024-01-22 DIAGNOSIS — Z87891 Personal history of nicotine dependence: Secondary | ICD-10-CM

## 2024-01-22 DIAGNOSIS — N3281 Overactive bladder: Secondary | ICD-10-CM | POA: Diagnosis present

## 2024-01-22 DIAGNOSIS — E559 Vitamin D deficiency, unspecified: Secondary | ICD-10-CM | POA: Diagnosis present

## 2024-01-22 DIAGNOSIS — H409 Unspecified glaucoma: Secondary | ICD-10-CM | POA: Diagnosis present

## 2024-01-22 DIAGNOSIS — R569 Unspecified convulsions: Secondary | ICD-10-CM

## 2024-01-22 DIAGNOSIS — Z79899 Other long term (current) drug therapy: Secondary | ICD-10-CM

## 2024-01-22 LAB — CBC
HCT: 26.6 % — ABNORMAL LOW (ref 39.0–52.0)
Hemoglobin: 9 g/dL — ABNORMAL LOW (ref 13.0–17.0)
MCH: 29 pg (ref 26.0–34.0)
MCHC: 33.8 g/dL (ref 30.0–36.0)
MCV: 85.8 fL (ref 80.0–100.0)
Platelets: 174 K/uL (ref 150–400)
RBC: 3.1 MIL/uL — ABNORMAL LOW (ref 4.22–5.81)
RDW: 21.1 % — ABNORMAL HIGH (ref 11.5–15.5)
WBC: 6.1 K/uL (ref 4.0–10.5)
nRBC: 0 % (ref 0.0–0.2)

## 2024-01-22 LAB — COMPREHENSIVE METABOLIC PANEL WITH GFR
ALT: 10 U/L (ref 0–44)
AST: 24 U/L (ref 15–41)
Albumin: 3 g/dL — ABNORMAL LOW (ref 3.5–5.0)
Alkaline Phosphatase: 76 U/L (ref 38–126)
Anion gap: 8 (ref 5–15)
BUN: 14 mg/dL (ref 8–23)
CO2: 24 mmol/L (ref 22–32)
Calcium: 8.1 mg/dL — ABNORMAL LOW (ref 8.9–10.3)
Chloride: 105 mmol/L (ref 98–111)
Creatinine, Ser: 0.93 mg/dL (ref 0.61–1.24)
GFR, Estimated: >60 mL/min
Glucose, Bld: 122 mg/dL — ABNORMAL HIGH (ref 70–99)
Potassium: 3.6 mmol/L (ref 3.5–5.1)
Sodium: 137 mmol/L (ref 135–145)
Total Bilirubin: 1 mg/dL (ref 0.0–1.2)
Total Protein: 6 g/dL — ABNORMAL LOW (ref 6.5–8.1)

## 2024-01-22 LAB — URINALYSIS, ROUTINE W REFLEX MICROSCOPIC
Bilirubin Urine: NEGATIVE
Glucose, UA: NEGATIVE mg/dL
Hgb urine dipstick: NEGATIVE
Ketones, ur: NEGATIVE mg/dL
Leukocytes,Ua: NEGATIVE
Nitrite: NEGATIVE
Protein, ur: NEGATIVE mg/dL
Specific Gravity, Urine: 1.035 — ABNORMAL HIGH (ref 1.005–1.030)
pH: 5 (ref 5.0–8.0)

## 2024-01-22 LAB — DIFFERENTIAL
Abs Immature Granulocytes: 0.29 K/uL — ABNORMAL HIGH (ref 0.00–0.07)
Basophils Absolute: 0.1 K/uL (ref 0.0–0.1)
Basophils Relative: 2 %
Eosinophils Absolute: 0.2 K/uL (ref 0.0–0.5)
Eosinophils Relative: 3 %
Immature Granulocytes: 5 %
Lymphocytes Relative: 29 %
Lymphs Abs: 1.8 K/uL (ref 0.7–4.0)
Monocytes Absolute: 0.7 K/uL (ref 0.1–1.0)
Monocytes Relative: 12 %
Neutro Abs: 3 K/uL (ref 1.7–7.7)
Neutrophils Relative %: 49 %
Smear Review: NORMAL

## 2024-01-22 LAB — I-STAT CHEM 8, ED
BUN: 15 mg/dL (ref 8–23)
Calcium, Ion: 1.18 mmol/L (ref 1.15–1.40)
Chloride: 103 mmol/L (ref 98–111)
Creatinine, Ser: 0.9 mg/dL (ref 0.61–1.24)
Glucose, Bld: 116 mg/dL — ABNORMAL HIGH (ref 70–99)
HCT: 27 % — ABNORMAL LOW (ref 39.0–52.0)
Hemoglobin: 9.2 g/dL — ABNORMAL LOW (ref 13.0–17.0)
Potassium: 3.6 mmol/L (ref 3.5–5.1)
Sodium: 139 mmol/L (ref 135–145)
TCO2: 21 mmol/L — ABNORMAL LOW (ref 22–32)

## 2024-01-22 LAB — TROPONIN T, HIGH SENSITIVITY
Troponin T High Sensitivity: 38 ng/L — ABNORMAL HIGH (ref 0–19)
Troponin T High Sensitivity: 40 ng/L — ABNORMAL HIGH (ref 0–19)

## 2024-01-22 LAB — URINE DRUG SCREEN
Amphetamines: NEGATIVE
Barbiturates: NEGATIVE
Benzodiazepines: NEGATIVE
Cocaine: NEGATIVE
Fentanyl: NEGATIVE
Methadone Scn, Ur: NEGATIVE
Opiates: NEGATIVE
Tetrahydrocannabinol: NEGATIVE

## 2024-01-22 LAB — ETHANOL: Alcohol, Ethyl (B): <15 mg/dL

## 2024-01-22 LAB — PRO BRAIN NATRIURETIC PEPTIDE: Pro Brain Natriuretic Peptide: 1587 pg/mL — ABNORMAL HIGH

## 2024-01-22 LAB — PROTIME-INR
INR: 1.5 — ABNORMAL HIGH (ref 0.8–1.2)
Prothrombin Time: 18.9 s — ABNORMAL HIGH (ref 11.4–15.2)

## 2024-01-22 LAB — APTT: aPTT: 53 s — ABNORMAL HIGH (ref 24–36)

## 2024-01-22 MED ORDER — LEVETIRACETAM 500 MG PO TABS
750.0000 mg | ORAL_TABLET | Freq: Two times a day (BID) | ORAL | Status: DC
  Administered 2024-01-22 – 2024-01-25 (×6): 750 mg via ORAL
  Filled 2024-01-22 (×6): qty 1

## 2024-01-22 MED ORDER — SERTRALINE HCL 50 MG PO TABS: 25.0000 mg | ORAL_TABLET | Freq: Every day | ORAL | Status: DC

## 2024-01-22 MED ORDER — BRIMONIDINE TARTRATE 0.2 % OP SOLN
1.0000 [drp] | Freq: Two times a day (BID) | OPHTHALMIC | Status: DC
  Administered 2024-01-22 – 2024-01-25 (×6): 1 [drp] via OPHTHALMIC
  Filled 2024-01-22: qty 5

## 2024-01-22 MED ORDER — MECLIZINE HCL 25 MG PO TABS
25.0000 mg | ORAL_TABLET | Freq: Once | ORAL | Status: AC
  Administered 2024-01-22: 25 mg via ORAL
  Filled 2024-01-22: qty 1

## 2024-01-22 MED ORDER — DORZOLAMIDE HCL-TIMOLOL MAL 2-0.5 % OP SOLN
1.0000 [drp] | Freq: Two times a day (BID) | OPHTHALMIC | Status: DC
  Administered 2024-01-22 – 2024-01-25 (×6): 1 [drp] via OPHTHALMIC
  Filled 2024-01-22: qty 10

## 2024-01-22 MED ORDER — PYRIDOXINE HCL 25 MG PO TABS
50.0000 mg | ORAL_TABLET | Freq: Every day | ORAL | Status: DC
  Administered 2024-01-22 – 2024-01-25 (×4): 50 mg via ORAL
  Filled 2024-01-22 (×4): qty 2

## 2024-01-22 MED ORDER — DIAZEPAM 5 MG/ML IJ SOLN
2.5000 mg | Freq: Once | INTRAMUSCULAR | Status: AC | PRN
  Administered 2024-01-22: 2.5 mg via INTRAVENOUS
  Filled 2024-01-22: qty 2

## 2024-01-22 MED ORDER — ONDANSETRON HCL 4 MG/2ML IJ SOLN: 4.0000 mg | Freq: Four times a day (QID) | INTRAMUSCULAR | Status: DC | PRN

## 2024-01-22 MED ORDER — LATANOPROST 0.005 % OP SOLN
1.0000 [drp] | Freq: Every day | OPHTHALMIC | Status: DC
  Administered 2024-01-22 – 2024-01-24 (×3): 1 [drp] via OPHTHALMIC
  Filled 2024-01-22: qty 2.5

## 2024-01-22 MED ORDER — APIXABAN 5 MG PO TABS
5.0000 mg | ORAL_TABLET | Freq: Two times a day (BID) | ORAL | Status: DC
  Administered 2024-01-22 – 2024-01-25 (×6): 5 mg via ORAL
  Filled 2024-01-22 (×7): qty 1

## 2024-01-22 MED ORDER — ACETAMINOPHEN 650 MG RE SUPP: 650.0000 mg | Freq: Four times a day (QID) | RECTAL | Status: DC | PRN

## 2024-01-22 MED ORDER — IOHEXOL 350 MG/ML SOLN
75.0000 mL | Freq: Once | INTRAVENOUS | Status: AC | PRN
  Administered 2024-01-22: 75 mL via INTRAVENOUS

## 2024-01-22 MED ORDER — POLYETHYLENE GLYCOL 3350 17 G PO PACK
17.0000 g | PACK | Freq: Every day | ORAL | Status: DC
  Administered 2024-01-23: 17 g via ORAL
  Filled 2024-01-22 (×3): qty 1

## 2024-01-22 MED ORDER — NETARSUDIL DIMESYLATE 0.02 % OP SOLN
1.0000 [drp] | Freq: Every day | OPHTHALMIC | Status: DC
  Administered 2024-01-22 – 2024-01-24 (×3): 1 [drp] via OPHTHALMIC

## 2024-01-22 MED ORDER — METOPROLOL SUCCINATE ER 25 MG PO TB24
12.5000 mg | ORAL_TABLET | Freq: Every day | ORAL | Status: DC
  Administered 2024-01-23 – 2024-01-25 (×3): 12.5 mg via ORAL
  Filled 2024-01-22 (×3): qty 1

## 2024-01-22 MED ORDER — MECLIZINE HCL 25 MG PO TABS
25.0000 mg | ORAL_TABLET | Freq: Three times a day (TID) | ORAL | Status: DC | PRN
  Administered 2024-01-22: 25 mg via ORAL
  Filled 2024-01-22: qty 1

## 2024-01-22 MED ORDER — PANTOPRAZOLE SODIUM 40 MG PO TBEC
40.0000 mg | DELAYED_RELEASE_TABLET | Freq: Every day | ORAL | Status: DC
  Administered 2024-01-23 – 2024-01-25 (×3): 40 mg via ORAL
  Filled 2024-01-22 (×3): qty 1

## 2024-01-22 MED ORDER — ONDANSETRON HCL 4 MG PO TABS: 4.0000 mg | ORAL_TABLET | Freq: Four times a day (QID) | ORAL | Status: DC | PRN

## 2024-01-22 MED ORDER — ACETAMINOPHEN 325 MG PO TABS: 650.0000 mg | ORAL_TABLET | Freq: Four times a day (QID) | ORAL | Status: DC | PRN

## 2024-01-22 MED ORDER — MECLIZINE HCL 25 MG PO TABS: 12.5000 mg | ORAL_TABLET | Freq: Once | ORAL | Status: DC

## 2024-01-22 MED ORDER — METOPROLOL SUCCINATE ER 25 MG PO TB24
12.5000 mg | ORAL_TABLET | Freq: Every day | ORAL | Status: DC
  Administered 2024-01-22: 12.5 mg via ORAL
  Filled 2024-01-22: qty 1

## 2024-01-22 MED ORDER — SERTRALINE HCL 50 MG PO TABS
25.0000 mg | ORAL_TABLET | Freq: Every day | ORAL | Status: DC
  Administered 2024-01-22 – 2024-01-25 (×4): 25 mg via ORAL
  Filled 2024-01-22 (×4): qty 1

## 2024-01-22 NOTE — Telephone Encounter (Signed)
 Patient is already checked in at [facility] ED per chart review. No outbound attempt made to call back due to patient currently seeking care.   Reason for Disposition  Patient already left for the hospital/clinic.  Protocols used: No Contact or Duplicate Contact Call-A-AH    Message from Zy'onna H sent at 01/22/2024  8:21 AM EST  Reason for Triage:   [name] Philips - Patients daughter called in stating her father has feeling of Illness/Weakness. She stated he has had a cold the past few weeks, and she is presuming it has traveled to his chest.  They are requesting the patient be seen/Make an appt with PCP - ASAP (Today if Applicable)  **Transf. To NTTHORA [name] callback #[phone]

## 2024-01-22 NOTE — ED Provider Notes (Signed)
 " [place] [facility] AT [facility] Provider Note   CSN: [id number] Arrival date & time: 01/22/24  1004     Patient presents with: Dizziness   Michael [name] is a 89 y.o. male.   HPI 89 year old male presents with strokelike symptoms.  The patient went to bed around 9 PM feeling okay, besides dealing with some respiratory symptoms for the last few weeks.  When he woke up around 4 AM, as soon as he tried to sit up to go to the bathroom he was acutely off balance and dizzy.  No headache, vision symptoms, speech deficits, or weakness/numbness in the extremities.  Since then, even when trying to use his walker he has been unable to ambulate.  He was brought here by family and vomited shortly after arrival.  No further nausea.  He has a history of prior strokes and A-fib and is on Eliquis , last taking it last night.  Prior to Admission medications  Medication Sig Start Date End Date Taking? Authorizing Provider  acetaminophen  (TYLENOL ) 500 MG tablet Take 500-1,000 mg by mouth every 8 (eight) hours as needed for mild pain or headache.     [provider]  apixaban  (ELIQUIS ) 5 MG TABS tablet Take 1 tablet (5 mg total) by mouth 2 (two) times daily. 09/06/21   Verlin Lonni BIRCH, MD  Ascorbic Acid  (VITAMIN C ) 1000 MG tablet Take 1,000 mg by mouth daily with breakfast.     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 10/03/17   [provider]  brimonidine  (ALPHAGAN ) 0.2 % ophthalmic solution Place 1 drop into the left eye in the morning and at bedtime.    [provider]  Cholecalciferol  (VITAMIN D3) 50 MCG (2000 UT) TABS Take 2,000 Units by mouth daily with breakfast.    [provider]  cyanocobalamin  (VITAMIN B12) 1000 MCG/ML injection INJECT 1ML INTO THE MUSCLE ONCE MONTHLY 09/27/23   [name], MD   [Paused] desmopressin  (DDAVP ) 0.1 MG tablet Take 0.1 mg by mouth at bedtime. Wait to take this until your doctor or other care provider tells you to start again. 04/30/22   [provider]  dorzolamide -timolol  (COSOPT ) 22.3-6.8 MG/ML ophthalmic solution Place 1 drop into the left eye in the morning and at bedtime. 11/05/15   [provider]  latanoprost  (XALATAN ) 0.005 % ophthalmic solution Place 1 drop into the left eye at bedtime. 09/12/17   [provider]  levETIRAcetam  (KEPPRA ) 750 MG tablet Take 1 tablet (750 mg total) by mouth 2 (two) times daily. 10/30/23   Gonfa, Taye T, MD  loperamide  (IMODIUM  A-D) 2 MG tablet Take 2 mg by mouth daily as needed for diarrhea or loose stools.    [provider]  metoprolol  succinate (TOPROL -XL) 25 MG 24 hr tablet Take 0.5 tablets (12.5 mg total) by mouth daily. 11/06/23   [name], MD  Netarsudil  Dimesylate 0.02 % SOLN Place 1 drop into the left eye at bedtime.    [provider]  [Paused] omeprazole  (PRILOSEC ) 20 MG capsule Take 20 mg by mouth daily as needed (for heartburn). Wait to take this until your doctor or other care provider tells you to start again.    [provider]  pantoprazole  (PROTONIX ) 40 MG tablet Take 1 tablet (40 mg total) by mouth daily before breakfast. 11/18/23   Sebastian Toribio GAILS, MD  polyethylene  glycol (MIRALAX  / GLYCOLAX ) 17 g packet Take 17 g by mouth daily. 11/18/23   Sebastian Toribio GAILS, MD  pyridOXINE  (B-6) 50 MG tablet Take 1 tablet (50 mg total) by mouth daily. 10/31/23   Gonfa, Taye T, MD  sertraline  (ZOLOFT ) 25 MG tablet Take 1 tablet (25 mg total) by mouth daily. 11/06/23   Burns, Glade PARAS, MD  SYRINGE-NEEDLE, DISP, 3 ML (BD INTEGRA SYRINGE) 25G X 1 3 ML MISC USE AS DIRECTED MONTHLY FOR B-12 INJECTIONS 01/17/24   Burns, Glade PARAS, MD  TUMS 500 MG chewable tablet Chew 1 tablet by mouth 3 (three) times daily as needed for indigestion or heartburn.    [provider]    Allergies: Patient has no known allergies.    Review of Systems  HENT:  Negative for ear pain.   Eyes:  Negative for visual disturbance.  Respiratory:  Positive for cough.   Cardiovascular:  Negative for chest pain.  Gastrointestinal:  Positive for vomiting.  Musculoskeletal:  Positive for gait problem.  Neurological:  Positive for dizziness. Negative for headaches.    Updated Vital Signs BP 139/62   Pulse 66   Temp (!) 97.1 F (36.2 C) (Oral)   Resp 19   Ht 5' 11 (1.803 m)   Wt 72.5 kg   SpO2 94%   BMI 22.29 kg/m   Physical Exam Vitals and nursing note reviewed.  Constitutional:      General: He is not in acute distress.    Appearance: He is well-developed. He is not ill-appearing or diaphoretic.  HENT:     Head: Normocephalic and atraumatic.  Eyes:     Extraocular Movements: Extraocular movements intact.     Pupils: Pupils are equal, round, and reactive to light.     Comments: Spontaneous nystagmus bilaterally  Cardiovascular:     Rate and Rhythm: Normal rate. Rhythm irregular.     Heart sounds: Normal heart sounds.  Pulmonary:     Effort: Pulmonary effort is normal.     Breath sounds: Normal breath sounds. No wheezing, rhonchi or rales.  Abdominal:     Palpations: Abdomen is soft.     Tenderness: There is no abdominal tenderness.  Skin:    General: Skin is warm and dry.  Neurological:     Mental Status: He is alert.     Comments: CN 3-12 grossly intact. 5/5 strength in all 4 extremities. Grossly normal sensation. Patient has some mild difficulty with right sided finger to nose. Intact heel to shin bilaterally     (all labs ordered are listed, but only abnormal results are displayed) Labs Reviewed  PROTIME-INR - Abnormal; Notable for the following components:      Result Value   Prothrombin Time 18.9 (*)    INR 1.5 (*)    All other components within normal limits  APTT - Abnormal; Notable for the following components:   aPTT 53 (*)    All other  components within normal limits  CBC - Abnormal; Notable for the following components:   RBC 3.10 (*)    Hemoglobin 9.0 (*)    HCT 26.6 (*)    RDW 21.1 (*)    All other components within normal limits  DIFFERENTIAL - Abnormal; Notable for the following components:   Abs Immature Granulocytes 0.29 (*)    All other components within normal limits  COMPREHENSIVE METABOLIC PANEL WITH GFR - Abnormal; Notable for the following components:   Glucose, Bld 122 (*)    Calcium   8.1 (*)    Total Protein 6.0 (*)    Albumin 3.0 (*)    All other components within normal limits  URINALYSIS, ROUTINE W REFLEX MICROSCOPIC - Abnormal; Notable for the following components:   Specific Gravity, Urine 1.035 (*)    All other components within normal limits  PRO BRAIN NATRIURETIC PEPTIDE - Abnormal; Notable for the following components:   Pro Brain Natriuretic Peptide 1,587.0 (*)    All other components within normal limits  I-STAT CHEM 8, ED - Abnormal; Notable for the following components:   Glucose, Bld 116 (*)    TCO2 21 (*)    Hemoglobin 9.2 (*)    HCT 27.0 (*)    All other components within normal limits  TROPONIN T, HIGH SENSITIVITY - Abnormal; Notable for the following components:   Troponin T High Sensitivity 38 (*)    All other components within normal limits  TROPONIN T, HIGH SENSITIVITY - Abnormal; Notable for the following components:   Troponin T High Sensitivity 40 (*)    All other components within normal limits  ETHANOL  URINE DRUG SCREEN    EKG: EKG Interpretation Date/Time:  Monday January 22 2024 10:22:19 EST Ventricular Rate:  80 PR Interval:    QRS Duration:  140 QT Interval:  432 QTC Calculation: 499 R Axis:   30  Text Interpretation: Atrial fibrillation Right bundle branch block Confirmed by Freddi Hamilton [phone]) on 01/22/2024 10:46:47 AM  Radiology: MR BRAIN WO CONTRAST Result Date: 01/22/2024 EXAM: MRI Brain Without Contrast 01/22/2024 12:45:57 PM TECHNIQUE:  Multiplanar multisequence MRI of the head/brain was performed without the administration of intravenous contrast. COMPARISON: None available. CLINICAL HISTORY: Neuro deficit, acute, stroke suspected FINDINGS: BRAIN AND VENTRICLES: No acute infarct. No acute intracranial hemorrhage. Chronic microhemorrhage in the left thalamus. Remote lacunar infarcts in the left basal ganglia and left thalamus and mild for age chronic microvascular ischemic change. No mass. No midline shift. No hydrocephalus. The sella is unremarkable. Normal flow voids. ORBITS: No significant abnormality. SINUSES AND MASTOIDS: No significant abnormality. BONES AND SOFT TISSUES: Normal marrow signal. No significant soft tissue abnormality. IMPRESSION: 1. No acute abnormality. Electronically signed by: Gilmore Molt MD MD 01/22/2024 02:34 PM EST RP Workstation: [id number]   DG Chest Portable 1 View Result Date: 01/22/2024 EXAM: 1 VIEW(S) XRAY OF THE CHEST 01/22/2024 11:28:00 AM COMPARISON: 09/20/2023 CLINICAL HISTORY: cough FINDINGS: LUNGS AND PLEURA: Low lung volumes. There are diffuse increased reticular interstitial opacities throughout both lungs compatible with chronic fibrotic interstitial lung disease. Hazy opacity at left lung base, possibly atelectasis or infection. No pleural effusion. No pneumothorax. HEART AND MEDIASTINUM: No acute abnormality of the cardiac and mediastinal silhouettes. BONES AND SOFT TISSUES: No acute osseous abnormality. IMPRESSION: 1. Hazy opacity at the left lung base, possibly atelectasis or infection. 2. Chronic fibrotic interstitial lung disease. Electronically signed by: Waddell Calk MD MD 01/22/2024 01:08 PM EST RP Workstation: [id number]   CT ANGIO HEAD NECK W WO CM Result Date: 01/22/2024 EXAM: CTA Head and Neck with Intravenous Contrast. CT Head without Contrast. CLINICAL HISTORY: 89 year old male with acute neurological deficit, stroke suspected. Dizziness upon waking at 0400 hours. Atrial  fibrillation on blood thinners. TECHNIQUE: Axial CTA images of the head and neck performed with intravenous contrast. MIP reconstructed images were created and reviewed. Axial computed tomography images of the head/brain performed without intravenous contrast. Note: Per PQRS, the description of internal carotid artery percent stenosis, including 0 percent or normal exam, is based on [employer] Symptomatic Carotid  Endarterectomy Trial (NASCET) criteria. Dose reduction technique was used including one or more of the following: automated exposure control, adjustment of mA and kV according to patient size, and/or iterative reconstruction. CONTRAST: With and without; 75 mL iohexol  (OMNIPAQUE ) 350 MG/ML. COMPARISON: Brain MRI 10/28/2023, Head CT 10/28/2023, and CTA Head and Neck 10/28/2023. FINDINGS: CT HEAD: BRAIN: Brain volume is stable and normal for age. Mild to moderate for age periventricular white matter hypodensity is stable. No acute intraparenchymal hemorrhage. No mass lesion. No CT evidence for acute territorial infarct. No midline shift or extra-axial collection. VENTRICLES: No hydrocephalus. ORBITS: No gaze deviation. SINUSES AND MASTOIDS: Chronic right maxillary sinus disease is stable. The paranasal sinuses are otherwise clear. Tympanic cavities and mastoids remain well aerated. CTA NECK: Mildly tortuous 3 vessel aortic arch with mild for age atherosclerosis. Calcified atherosclerosis at the right subclavian artery origin without stenosis. CAROTID ARTERIES: Tortuous cervical carotid arteries with mild for age atherosclerosis and no stenosis. No dissection or occlusion. Tortuous cervical carotid arteries with mild for age atherosclerosis and no stenosis. No dissection or occlusion. Left ICA siphon mild to moderate calcified atherosclerosis without stenosis. Right ICA siphon minimal calcified atherosclerosis and no stenosis. Normal left posterior communicating artery origin. Anterior communicating and  right posterior communicating arteries are diminutive or absent. VERTEBRAL ARTERIES: Normal vertebral artery origins. No significant vertebral artery atherosclerosis or stenosis. Codominant vertebral arteries. Normal PICA origins. No dissection or occlusion. CTA HEAD: ANTERIOR CEREBRAL ARTERIES: Normal ACA origins. No significant stenosis. No occlusion. No aneurysm. MIDDLE CEREBRAL ARTERIES: Normal MCA origins. No significant stenosis. No occlusion. No aneurysm. POSTERIOR CEREBRAL ARTERIES: Normal PCA origins. No significant stenosis. No occlusion. No aneurysm. BASILAR ARTERY: No significant stenosis. No occlusion. No aneurysm. OTHER: Major dural venous sinuses are enhancing and patent. Right transverse and sigmoid sinuses are dominant, normal variant. SOFT TISSUES: Nonvascular neck soft tissue spaces no acute finding. BONES: Severe right TMJ degeneration. Cervical spine degeneration with associated degenerative ankylosis at C3-C4. No acute osseous abnormality. 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 Acute respiratory infection difficult to exclude. Visible central pulmonary arteries are patent. No superior mediastinal lymphadenopathy. IMPRESSION: 1. Chronic lung disease with progressive upper lung opacities, new layering pleural effusions since last year. Respiratory infection not excluded. 2. Chronic arterial tortuosity with mild panarteritis atherosclerosis. No stenosis, aneurysm, or dissection in the head or neck arteries. 3. No acute intracranial abnormality. Electronically signed by: Helayne Hurst MD MD 01/22/2024 11:41 AM EST RP Workstation: [id number]     Procedures   Medications Ordered in the ED  meclizine  (ANTIVERT ) tablet 25  mg (has no administration in time range)  meclizine  (ANTIVERT ) tablet 25 mg (has no administration in time range)  iohexol  (OMNIPAQUE ) 350 MG/ML injection 75 mL (75 mLs Intravenous Contrast Given 01/22/24 1059)  diazepam  (VALIUM ) injection 2.5 mg (2.5 mg Intravenous Given 01/22/24 1220)                                    Medical Decision Making Amount and/or Complexity of Data Reviewed Labs: ordered.    Details: Mild troponin elevation Radiology: ordered and independent interpretation performed.    Details: No stroke ECG/medicine tests: ordered and independent interpretation performed.    Details: A-fib  Risk  Prescription drug management. Decision regarding hospitalization.   Presents with what seems like vertigo with nystagmus.  Workup is not showing an obvious stroke.  However despite some Valium , he is still quite symptomatic and cannot sit up in the bed without getting very dizzy.  Vital signs are reassuring.  Will continue vertigo treatment.  He is concerned about possible pneumonia given subacute cough/shortness of breath symptoms.  However with some small pleural effusion seen on the CT of his lungs, this is probably undiagnosed CHF.  No obvious sign of infection though it is an equivocal finding otherwise.  Will hold on antibiotics for now as he is not febrile, normal WBC, etc.  He is not unable to go home as he cannot get out of the bed.  Will need continued supportive care.  Discussed case with Dr. Celinda for admission.      Final diagnoses:  Vertigo    ED Discharge Orders     None          Freddi Hamilton, MD 01/22/24 1504  "

## 2024-01-22 NOTE — ED Triage Notes (Signed)
 Pt reports dizziness upon waking around 0400. Worse with movement. LKW 9p. Denies headache, no deficits noted. On eliquis  for afib, hx cva. No falls. Had a cold a few weeks ago, tested neg for everything.

## 2024-01-22 NOTE — H&P (Signed)
 " History and Physical    Patient: Michael [name] FMW:[id number] DOB: [birth year] DOA: 01/22/2024 DOS: the patient was seen and examined on 01/22/2024 PCP: [name], MD  Patient coming from: Home  Chief Complaint:  Chief Complaint  Patient presents with   Dizziness   HPI: Michael [name] is a 89 y.o. male with medical history significant of  adrenal insufficiency, unspecified anemia, paroxysmal atrial fibrillation, history of DVT, avascular necrosis, clavicle fracture, Crohn's, colon polyps, COVID-19, Crohn's disease, glaucoma, pseudophakia of both sides, vitreous prolapse of the right eye, hypomagnesemia, hypokalemia, recurrent bilateral inguinal hernia, male hypogonadism, osteopenia, retinal ischemia, B12 deficiency, vitamin D  deficiency, history of pneumonia, thrombocytopenia, history of encephalitis, history of seizure-like activity, small bowel obstruction, history of TIA, hypertension, hyperlipidemia, aortic atherosclerosis, mild cognitive impairment, overactive bladder, bilateral hearing loss who came to the emergency department due to dizziness.  He had an URI 2 to 3 weeks ago and has developed a cough after that.  This morning he woke up around 0400 to go to the bathroom.  When he tried to sit up he became very dizzy and felt his gait was unsteady.  He tried to use his walker, but was unable to ambulate.  He was brought by his daughter to the emergency department and on arrival became nauseous followed by an episode of emesis.  He denied fever, chills, rhinorrhea, sore throat, wheezing or hemoptysis.  No chest pain, palpitations, diaphoresis, PND, orthopnea or pitting edema of the lower extremities.  No abdominal pain, diarrhea, constipation, melena or hematochezia.  No flank pain, dysuria, frequency or hematuria.  No polyuria, polydipsia, polyphagia or blurred vision.   Lab work: Urinalysis had a specific gravity of 1.035, but was otherwise unremarkable.  Negative UDS.  CBC showed a  white count of 6.1, hemoglobin 9.0 g/dL and platelets 825.  PT 18.9, INR 1.5 and PTT 53.  CMP showed a glucose of 122, total protein 6.0 and albumin 3.0 g/dL, electrolytes, renal function and the rest of the hepatic functions were normal.  Unremarkable alcohol level.  Troponin was 38 then 40 ng/L with proBNP 1587.0 pg/mL.  Imaging: Portable 1 view chest radiograph showed a hazy opacity at the left lung base, possibly atelectasis or infection.  Chronic fibrotic interstitial lung disease.  CTA head and neck with no acute intracranial abnormality.  Chronic arterial tortuosity with mild pan arthritis atherosclerosis.  No stenosis, aneurysm, or dissection in the head or neck arteries.  There is chronic lung disease with progressive upper lung opacities, new layering pleural effusion since last year.  Respiratory infection not excluded.  MRI brain without contrast with no acute abnormality.    ED course: Initial vital signs were temperature 97.5 F, pulse 79, respirations 20, BP 133/79 mmHg and O2 sat 96% on room air.  The patient received diazepam  2.5 mg IVP prior to MRI imaging.  It did not help much with his dizziness.  Review of Systems: As mentioned in the history of present illness. All other systems reviewed and are negative. Past Medical History:  Diagnosis Date   Adrenal insufficiency    Anemia    Atrial fibrillation (HCC)    Avascular necrosis (HCC)    Clavicle fracture 11/02/2017   Colon polyp    Colon polyps    Crohn's disease (HCC)    Glaucoma    Inguinal hernia recurrent bilateral    Low testosterone     Osteopenia    Retinal ischemia    Past Surgical History:  Procedure Laterality  Date   ABDOMINAL SURGERY     BIOPSY  03/19/2020   Procedure: BIOPSY;  Surgeon: Leigh Elspeth SQUIBB, MD;  Location: [facility];  Service: Gastroenterology;;   COLONOSCOPY N/A 12/13/2015   Procedure: COLONOSCOPY;  Surgeon: Lynwood Bohr, MD;  Location: [facility];  Service: Endoscopy;  Laterality: N/A;    COLONOSCOPY WITH PROPOFOL  N/A 03/25/2019   Procedure: COLONOSCOPY WITH PROPOFOL ;  Surgeon: Leigh Elspeth SQUIBB, MD;  Location: [facility];  Service: Gastroenterology;  Laterality: N/A;   COLONOSCOPY WITH PROPOFOL  N/A 08/26/2019   Procedure: COLONOSCOPY WITH PROPOFOL ;  Surgeon: Leigh Elspeth SQUIBB, MD;  Location: [facility];  Service: Gastroenterology;  Laterality: N/A;   COLONOSCOPY WITH PROPOFOL  N/A 03/19/2020   Procedure: COLONOSCOPY WITH PROPOFOL ;  Surgeon: Leigh Elspeth SQUIBB, MD;  Location: [facility];  Service: Gastroenterology;  Laterality: N/A;   ENDOSCOPIC MUCOSAL RESECTION N/A 08/26/2019   Procedure: ENDOSCOPIC MUCOSAL RESECTION;  Surgeon: Leigh Elspeth SQUIBB, MD;  Location: [facility];  Service: Gastroenterology;  Laterality: N/A;   ESOPHAGOGASTRODUODENOSCOPY N/A 11/15/2023   Procedure: EGD (ESOPHAGOGASTRODUODENOSCOPY);  Surgeon: Avram Lupita BRAVO, MD;  Location: [facility];  Service: Gastroenterology;  Laterality: N/A;   FLEXIBLE SIGMOIDOSCOPY N/A 11/15/2023   Procedure: KINGSTON SIDE;  Surgeon: Avram Lupita BRAVO, MD;  Location: [facility];  Service: Gastroenterology;  Laterality: N/A;  Fecal disimpaction under sedation   INGUINAL HERNIA REPAIR Bilateral 05/21/2014   Procedure: OPEN BILATERAL INGUINAL HERNIA REPAIRS WITH MESH;  Surgeon: Donnice Lima, MD;  Location: [facility];  Service: General;  Laterality: Bilateral;   POLYPECTOMY  03/25/2019   Procedure: POLYPECTOMY;  Surgeon: Leigh Elspeth SQUIBB, MD;  Location: [facility];  Service: Gastroenterology;;   POLYPECTOMY  08/26/2019   Procedure: POLYPECTOMY;  Surgeon: Leigh Elspeth SQUIBB, MD;  Location: [facility];  Service: Gastroenterology;;   POLYPECTOMY  03/19/2020   Procedure: POLYPECTOMY;  Surgeon: Leigh Elspeth SQUIBB, MD;  Location: [facility];  Service: Gastroenterology;;   WALDEMAR  08/26/2019   Procedure: Sprayed methylene blue ;  Surgeon: Leigh Elspeth SQUIBB, MD;  Location: [facility];  Service: Gastroenterology;;   SMALL INTESTINE SURGERY  1968, 2001    related to Chrohn's disease   TONSILLECTOMY     Social History:  reports that he quit smoking about 47 years ago. His smoking use included cigarettes. He has never used smokeless tobacco. He reports current alcohol use of about 2.0 standard drinks of alcohol per week. He reports that he does not use drugs.  Allergies[1]  Family History  Problem Relation Age of Onset   Deep vein thrombosis Mother    Transient ischemic attack Mother    Heart disease Father    Cancer Maternal Aunt        unknown type; dx after 45   Cancer Cousin        maternal cousin; unknown type   Cancer Cousin        paternal cousin; unknown type; dx after 38   Stomach cancer Neg Hx    Colon cancer Neg Hx    Esophageal cancer Neg Hx    Pancreatic cancer Neg Hx    Rectal cancer Neg Hx    Colon polyps Neg Hx     Prior to Admission medications  Medication Sig Start Date End Date Taking? Authorizing Provider  acetaminophen  (TYLENOL ) 500 MG tablet Take 500-1,000 mg by mouth every 8 (eight) hours as needed for mild pain or headache.     [provider]  apixaban  (ELIQUIS ) 5 MG TABS tablet Take 1 tablet (5  mg total) by mouth 2 (two) times daily. 09/06/21   Verlin Lonni BIRCH, MD  Ascorbic Acid  (VITAMIN C ) 1000 MG tablet Take 1,000 mg by mouth daily with breakfast.     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 10/03/17   [provider]  brimonidine  (ALPHAGAN ) 0.2 % ophthalmic solution Place 1 drop into the left eye in the morning and at bedtime.    [provider]  Cholecalciferol  (VITAMIN D3) 50 MCG (2000 UT) TABS Take 2,000 Units by mouth daily with breakfast.    [provider]  cyanocobalamin  (VITAMIN B12) 1000 MCG/ML injection INJECT 1ML INTO THE MUSCLE ONCE  MONTHLY 09/27/23   [name], MD  [Paused] desmopressin  (DDAVP ) 0.1 MG tablet Take 0.1 mg by mouth at bedtime. Wait to take this until your doctor or other care provider tells you to start again. 04/30/22   [provider]  dorzolamide -timolol  (COSOPT ) 22.3-6.8 MG/ML ophthalmic solution Place 1 drop into the left eye in the morning and at bedtime. 11/05/15   [provider]  latanoprost  (XALATAN ) 0.005 % ophthalmic solution Place 1 drop into the left eye at bedtime. 09/12/17   [provider]  levETIRAcetam  (KEPPRA ) 750 MG tablet Take 1 tablet (750 mg total) by mouth 2 (two) times daily. 10/30/23   Gonfa, Taye T, MD  loperamide  (IMODIUM  A-D) 2 MG tablet Take 2 mg by mouth daily as needed for diarrhea or loose stools.    [provider]  metoprolol  succinate (TOPROL -XL) 25 MG 24 hr tablet Take 0.5 tablets (12.5 mg total) by mouth daily. 11/06/23   [name], MD  Netarsudil  Dimesylate 0.02 % SOLN Place 1 drop into the left eye at bedtime.    [provider]  [Paused] omeprazole  (PRILOSEC ) 20 MG capsule Take 20 mg by mouth daily as needed (for heartburn). Wait to take this until your doctor or other care provider tells you to start again.    [provider]  pantoprazole  (PROTONIX ) 40 MG tablet Take 1 tablet (40 mg total) by mouth daily before breakfast. 11/18/23   Sebastian Toribio GAILS, MD  polyethylene glycol (MIRALAX  / GLYCOLAX ) 17 g packet Take 17 g by mouth daily. 11/18/23   Sebastian Toribio GAILS, MD  pyridOXINE  (B-6) 50 MG tablet Take 1 tablet (50 mg total) by mouth daily. 10/31/23   Gonfa, Taye T, MD  sertraline  (ZOLOFT ) 25 MG tablet Take 1 tablet (25 mg total) by mouth daily. 11/06/23   Burns, Glade PARAS, MD  SYRINGE-NEEDLE, DISP, 3 ML (BD INTEGRA SYRINGE) 25G X 1 3 ML MISC USE AS DIRECTED MONTHLY FOR B-12 INJECTIONS 01/17/24   Burns, Glade PARAS, MD  TUMS 500 MG chewable tablet Chew 1 tablet by mouth 3 (three) times daily as needed for indigestion or  heartburn.    [provider]    Physical Exam: Vitals:   01/22/24 1345 01/22/24 1350 01/22/24 1351 01/22/24 1352  BP: 139/62     Pulse: 66 64  66  Resp:  19  19  Temp:   (!) 97.1 F (36.2 C)   TempSrc:   Oral   SpO2: 93% 94%  94%  Weight:      Height:       Physical Exam Vitals and nursing note reviewed.  Constitutional:      General: He is awake. He is not in acute distress.  Appearance: Normal appearance. He is ill-appearing.     Interventions: Nasal cannula in place.  HENT:     Head: Normocephalic.     Nose: No rhinorrhea.     Mouth/Throat:     Mouth: Mucous membranes are moist.  Eyes:     General: No scleral icterus.    Pupils: Pupils are equal, round, and reactive to light.  Neck:     Vascular: No JVD.  Cardiovascular:     Rate and Rhythm: Normal rate and regular rhythm.     Heart sounds: S1 normal and S2 normal.  Pulmonary:     Effort: Pulmonary effort is normal.     Breath sounds: Normal breath sounds. No wheezing or rales.  Abdominal:     General: Bowel sounds are normal. There is no distension.     Palpations: Abdomen is soft.     Tenderness: There is no abdominal tenderness. There is no right CVA tenderness or left CVA tenderness.  Musculoskeletal:     Cervical back: Neck supple.     Right lower leg: No edema.     Left lower leg: No edema.  Skin:    General: Skin is warm and dry.  Neurological:     General: No focal deficit present.     Mental Status: He is alert and oriented to person, place, and time.  Psychiatric:        Mood and Affect: Mood normal.        Behavior: Behavior normal. Behavior is cooperative.     Data Reviewed:  Results are pending, will review when available.  09/23/2020 transthoracic echocardiogram report. IMPRESSIONS:   1. Left ventricular ejection fraction, by estimation, is 55 to 60%. Left  ventricular ejection fraction by PLAX is 57 %. The left ventricle has  normal function. The left ventricle  demonstrates regional wall motion  abnormalities (see scoring  diagram/findings for description). Left ventricular diastolic parameters  are consistent with Grade I diastolic dysfunction (impaired relaxation).   2. Right ventricular systolic function is normal. The right ventricular  size is normal. There is normal pulmonary artery systolic pressure.   3. The mitral valve is normal in structure. Trivial mitral valve  regurgitation. No evidence of mitral stenosis.   4. The aortic valve is tricuspid. Aortic valve regurgitation is trivial.  No aortic stenosis is present.   5. Aortic dilatation noted. There is borderline dilatation of the aortic  root, measuring 36 mm.   6. The inferior vena cava is normal in size with greater than 50%  respiratory variability, suggesting right atrial pressure of 3 mmHg.   EKG: Vent. rate 80 BPM PR interval * ms QRS duration 140 ms QT/QTcB 432/499 ms P-R-T axes * 30 23 Atrial fibrillation Right bundle branch block  Assessment and Plan: Principal Problem:   Dizziness In the setting of:   Vestibular disequilibrium Observation/telemetry. Continue IV fluids. Hold beta-blocker. Continue meclizine  25 mg p.o. 3 times daily as needed. Consider physical therapy evaluation in AM. Would benefit from ENT evaluation as an outpatient.  Active Problems:   Hypertension Continue metoprolol  succinate 12.5 mg p.o. daily.    B12 deficiency Continue monthly IM B12 injections.    Vitamin D  deficiency Continue cholecalciferol  supplementation.    Mild cognitive impairment Supportive care.    Crohn's disease (HCC) Does not seem to be active. Will follow GI recommendations.     PAF (paroxysmal atrial fibrillation) (HCC) No endoscopic procedures planned tomorrow. However, we will hold apixaban  tonight in case changes.  Aortic atherosclerosis Would benefit from statin therapy. Follow-up with primary care provider.     Seizure-like activity  (HCC) Continue levetiracetam  750 mg p.o. twice daily.     Glaucoma Continue dorzolamide  and timolol  drops. Continue latanoprost  and netarsudil  drops. Follow-up with ophthalmology as an outpatient.     Advance Care Planning:   Code Status: Full Code   Consults:   Family Communication:   Severity of Illness: The appropriate patient status for this patient is OBSERVATION. Observation status is judged to be reasonable and necessary in order to provide the required intensity of service to ensure the patient's safety. The patient's presenting symptoms, physical exam findings, and initial radiographic and laboratory data in the context of their medical condition is felt to place them at decreased risk for further clinical deterioration. Furthermore, it is anticipated that the patient will be medically stable for discharge from the hospital within 2 midnights of admission.   Author: Alm Dorn Castor, MD 01/22/2024 2:52 PM  For on call review [email or web address].   This document was prepared using Dragon voice recognition software and may contain some unintended transcription errors.     [1] No Known Allergies  "

## 2024-01-23 LAB — IRON AND TIBC
Iron: 26 ug/dL — ABNORMAL LOW (ref 45–182)
Saturation Ratios: 16 % — ABNORMAL LOW (ref 17.9–39.5)
TIBC: 165 ug/dL — ABNORMAL LOW (ref 250–450)
UIBC: 139 ug/dL

## 2024-01-23 LAB — CBC
HCT: 24.1 % — ABNORMAL LOW (ref 39.0–52.0)
Hemoglobin: 8.1 g/dL — ABNORMAL LOW (ref 13.0–17.0)
MCH: 28.7 pg (ref 26.0–34.0)
MCHC: 33.6 g/dL (ref 30.0–36.0)
MCV: 85.5 fL (ref 80.0–100.0)
Platelets: 164 K/uL (ref 150–400)
RBC: 2.82 MIL/uL — ABNORMAL LOW (ref 4.22–5.81)
RDW: 21.2 % — ABNORMAL HIGH (ref 11.5–15.5)
WBC: 5.4 K/uL (ref 4.0–10.5)
nRBC: 0 % (ref 0.0–0.2)

## 2024-01-23 LAB — FERRITIN: Ferritin: 239 ng/mL (ref 24–336)

## 2024-01-23 LAB — COMPREHENSIVE METABOLIC PANEL WITH GFR
ALT: 10 U/L (ref 0–44)
AST: 24 U/L (ref 15–41)
Albumin: 2.6 g/dL — ABNORMAL LOW (ref 3.5–5.0)
Alkaline Phosphatase: 74 U/L (ref 38–126)
Anion gap: 8 (ref 5–15)
BUN: 13 mg/dL (ref 8–23)
CO2: 23 mmol/L (ref 22–32)
Calcium: 8 mg/dL — ABNORMAL LOW (ref 8.9–10.3)
Chloride: 104 mmol/L (ref 98–111)
Creatinine, Ser: 0.79 mg/dL (ref 0.61–1.24)
GFR, Estimated: >60 mL/min
Glucose, Bld: 90 mg/dL (ref 70–99)
Potassium: 3.8 mmol/L (ref 3.5–5.1)
Sodium: 135 mmol/L (ref 135–145)
Total Bilirubin: 0.8 mg/dL (ref 0.0–1.2)
Total Protein: 5.5 g/dL — ABNORMAL LOW (ref 6.5–8.1)

## 2024-01-23 LAB — VITAMIN B12: Vitamin B-12: 847 pg/mL (ref 180–914)

## 2024-01-23 LAB — FOLATE: Folate: 5.4 ng/mL — ABNORMAL LOW

## 2024-01-23 LAB — TSH: TSH: 2.84 u[IU]/mL (ref 0.350–4.500)

## 2024-01-23 MED ORDER — SODIUM CHLORIDE 0.9 % IV SOLN
2.0000 g | INTRAVENOUS | Status: DC
  Administered 2024-01-23 – 2024-01-25 (×3): 2 g via INTRAVENOUS
  Filled 2024-01-23 (×3): qty 20

## 2024-01-23 MED ORDER — AZITHROMYCIN 250 MG PO TABS
500.0000 mg | ORAL_TABLET | Freq: Every day | ORAL | Status: DC
  Administered 2024-01-23 – 2024-01-25 (×3): 500 mg via ORAL
  Filled 2024-01-23 (×3): qty 2

## 2024-01-23 MED ORDER — SODIUM CHLORIDE 0.9 % IV BOLUS
250.0000 mL | Freq: Once | INTRAVENOUS | Status: AC
  Administered 2024-01-23: 250 mL via INTRAVENOUS

## 2024-01-23 NOTE — Hospital Course (Signed)
"   Michael [name] is a 89 y.o. male with medical history significant of  adrenal insufficiency, unspecified anemia, paroxysmal atrial fibrillation, history of DVT, avascular necrosis, clavicle fracture, Crohn's, colon polyps, COVID-19, Crohn's disease, glaucoma, pseudophakia of both sides, vitreous prolapse of the right eye, hypomagnesemia, hypokalemia, recurrent bilateral inguinal hernia, male hypogonadism, osteopenia, retinal ischemia, B12 deficiency, vitamin D  deficiency, history of pneumonia, thrombocytopenia, history of encephalitis, history of seizure-like activity, small bowel obstruction, history of TIA, hypertension, hyperlipidemia, aortic atherosclerosis, mild cognitive impairment, overactive bladder, bilateral hearing loss who came to the emergency department due to dizziness.  He had an URI 2 to 3 weeks ago and has developed a cough after that.  This morning he woke up around 0400 to go to the bathroom.  When he tried to sit up he became very dizzy and felt his gait was unsteady.  He tried to use his walker, but was unable to ambulate.  He was brought by his daughter to the emergency department and on arrival became nauseous followed by an episode of emesis.  He denied fever, chills, rhinorrhea, sore throat, wheezing or hemoptysis.  No chest pain, palpitations, diaphoresis, PND, orthopnea or pitting edema of the lower extremities.  No abdominal pain, diarrhea, constipation, melena or hematochezia.  No flank pain, dysuria, frequency or hematuria.  No polyuria, polydipsia, polyphagia or blurred vision.    Lab work: Urinalysis had a specific gravity of 1.035, but was otherwise unremarkable.  Negative UDS.  CBC showed a white count of 6.1, hemoglobin 9.0 g/dL and platelets 825.  PT 18.9, INR 1.5 and PTT 53.  CMP showed a glucose of 122, total protein 6.0 and albumin 3.0 g/dL, electrolytes, renal function and the rest of the hepatic functions were normal.  Unremarkable alcohol level.  Troponin was 38 then  40 ng/L with proBNP 1587.0 pg/mL.   Imaging: Portable 1 view chest radiograph showed a hazy opacity at the left lung base, possibly atelectasis or infection.  Chronic fibrotic interstitial lung disease.  CTA head and neck with no acute intracranial abnormality.  Chronic arterial tortuosity with mild pan arthritis atherosclerosis.  No stenosis, aneurysm, or dissection in the head or neck arteries.  There is chronic lung disease with progressive upper lung opacities, new layering pleural effusion since last year.  Respiratory infection not excluded.  MRI brain without contrast with no acute abnormality.    ED course: Initial vital signs were temperature 97.5 F, pulse 79, respirations 20, BP 133/79 mmHg and O2 sat 96% on room air.  The patient received diazepam  2.5 mg IVP prior to MRI imaging.  It did not help much with his dizziness.   "

## 2024-01-23 NOTE — Progress Notes (Addendum)
 " Progress Note   Patient: Michael [name] DOB: 1932-08-18 DOA: 01/22/2024     0 DOS: the patient was seen and examined on 01/23/2024   Brief hospital course:  Michael [name] is a 89 y.o. male with medical history significant of  adrenal insufficiency, unspecified anemia, paroxysmal atrial fibrillation, history of DVT, avascular necrosis, clavicle fracture, Crohn's, colon polyps, COVID-19, Crohn's disease, glaucoma, pseudophakia of both sides, vitreous prolapse of the right eye, hypomagnesemia, hypokalemia, recurrent bilateral inguinal hernia, male hypogonadism, osteopenia, retinal ischemia, B12 deficiency, vitamin D  deficiency, history of pneumonia, thrombocytopenia, history of encephalitis, history of seizure-like activity, small bowel obstruction, history of TIA, hypertension, hyperlipidemia, aortic atherosclerosis, mild cognitive impairment, overactive bladder, bilateral hearing loss who came to the emergency department due to dizziness.  He had an URI 2 to 3 weeks ago and has developed a cough after that.  This morning he woke up around 0400 to go to the bathroom.  When he tried to sit up he became very dizzy and felt his gait was unsteady.  He tried to use his walker, but was unable to ambulate.  He was brought by his daughter to the emergency department and on arrival became nauseous followed by an episode of emesis.  He denied fever, chills, rhinorrhea, sore throat, wheezing or hemoptysis.  No chest pain, palpitations, diaphoresis, PND, orthopnea or pitting edema of the lower extremities.  No abdominal pain, diarrhea, constipation, melena or hematochezia.  No flank pain, dysuria, frequency or hematuria.  No polyuria, polydipsia, polyphagia or blurred vision.    Lab work: Urinalysis had a specific gravity of 1.035, but was otherwise unremarkable.  Negative UDS.  CBC showed a white count of 6.1, hemoglobin 9.0 g/dL and platelets 825.  PT 18.9, INR 1.5 and PTT 53.  CMP showed a glucose of  122, total protein 6.0 and albumin 3.0 g/dL, electrolytes, renal function and the rest of the hepatic functions were normal.  Unremarkable alcohol level.  Troponin was 38 then 40 ng/L with proBNP 1587.0 pg/mL.   Imaging: Portable 1 view chest radiograph showed a hazy opacity at the left lung base, possibly atelectasis or infection.  Chronic fibrotic interstitial lung disease.  CTA head and neck with no acute intracranial abnormality.  Chronic arterial tortuosity with mild pan arthritis atherosclerosis.  No stenosis, aneurysm, or dissection in the head or neck arteries.  There is chronic lung disease with progressive upper lung opacities, new layering pleural effusion since last year.  Respiratory infection not excluded.  MRI brain without contrast with no acute abnormality.    ED course: Initial vital signs were temperature 97.5 F, pulse 79, respirations 20, BP 133/79 mmHg and O2 sat 96% on room air.  The patient received diazepam  2.5 mg IVP prior to MRI imaging.  It did not help much with his dizziness.    Assessment and Plan: 89 year old male who was admitted for for possible dizziness.  Patient had been coughing had a runny nose and CT scan of the chest showed upper lobe infiltrate with pleural effusion.  1.  Community-acquired pneumonia with parapneumonic effusion on CT scan of head and neck - Patient was coughing so bad and gives history of upper respiratory tract infection few days prior to coming to the hospital - He will be started on antibiotic - He is not hypoxic - Monitor his symptoms. - If he feels better he may be discharged home with follow-up as outpatient tomorrow.  2.  Dizziness may be vestibular in origin versus  orthostatic hypotension. - He was found to have orthostatic this morning - He was given IV fluid, I will give him 250 cc of bolus - Continue to hold beta-blocker, continue meclizine  - PT/OT   3. Hypertension Continue metoprolol  succinate 12.5 mg p.o. daily.    4.   B12 deficiency Continue monthly IM B12 injections.    5.  Vitamin D  deficiency Continue cholecalciferol  supplementation.     6. Mild cognitive impairment Supportive care.    7.  Crohn's disease (HCC) Does not seem to be active. Will follow GI recommendations.     8. PAF (paroxysmal atrial fibrillation) (HCC) No endoscopic procedures planned tomorrow. However, we will hold apixaban  tonight in case changes.   9.   Aortic atherosclerosis Would benefit from statin therapy. Follow-up with primary care provider.    10. Seizure-like activity (HCC) Continue levetiracetam  750 mg p.o. twice daily.     11. Glaucoma Continue dorzolamide  and timolol  drops. Continue latanoprost  and netarsudil  drops. Follow-up with ophthalmology as an outpatient.  12.  Anemia of chronic disease - Patient denies any bleeding, hematemesis or melena. - MCV is 85. - Will continue to monitor bleeding. - Will check iron  panels         Subjective: Still complain some dizziness and cough with productive sputum  Physical Exam: Vitals:   01/23/24 0833 01/23/24 0836 01/23/24 0909 01/23/24 1208  BP: (!) 141/69 125/67 (!) 157/74 (!) 140/69  Pulse: 69 79 81 67  Resp: 18 16  17   Temp:    97.9 F (36.6 C)  TempSrc:      SpO2: 94%   96%  Weight:      Height:       Constitutional: Alert, awake, calm, comfortable HEENT: Neck supple Respiratory: Bilateral decreased air entry at the bases, occasional wheezing no rales no rhonchi Cardiovascular: Regular rate and rhythm, no murmurs / rubs / gallops. No extremity edema. 2+ pedal pulses. No carotid bruits.  Abdomen: Soft, no tenderness, Bowel sounds positive.  Musculoskeletal: no clubbing / cyanosis. Good ROM, no contractures. Normal muscle tone.  Skin: no rashes, lesions, ulcers. Neurologic: CN 2-12 grossly intact. Sensation intact, No focal deficit identified Psychiatric: Alert and oriented x 3. Normal mood.    Data Reviewed:  Reviewed  Family  Communication: Daughter over the phone  Disposition: Status is: Observation The patient remains OBS appropriate and will d/c before 2 midnights.  Planned Discharge Destination: Home with Home Health    Time spent: 35 minutes  Author: Keshara Kiger, MD 01/23/2024 1:20 PM  For on call review [email or web address].  "

## 2024-01-24 ENCOUNTER — Inpatient Hospital Stay (HOSPITAL_COMMUNITY)

## 2024-01-24 ENCOUNTER — Telehealth (HOSPITAL_COMMUNITY): Payer: Self-pay | Admitting: Pharmacist

## 2024-01-24 ENCOUNTER — Telehealth: Payer: Self-pay | Admitting: Pharmacy Technician

## 2024-01-24 DIAGNOSIS — I700 Atherosclerosis of aorta: Secondary | ICD-10-CM | POA: Diagnosis present

## 2024-01-24 DIAGNOSIS — Z79899 Other long term (current) drug therapy: Secondary | ICD-10-CM | POA: Diagnosis not present

## 2024-01-24 DIAGNOSIS — R7989 Other specified abnormal findings of blood chemistry: Secondary | ICD-10-CM

## 2024-01-24 DIAGNOSIS — Z86718 Personal history of other venous thrombosis and embolism: Secondary | ICD-10-CM | POA: Diagnosis not present

## 2024-01-24 DIAGNOSIS — J189 Pneumonia, unspecified organism: Secondary | ICD-10-CM | POA: Diagnosis present

## 2024-01-24 DIAGNOSIS — G3184 Mild cognitive impairment, so stated: Secondary | ICD-10-CM | POA: Diagnosis present

## 2024-01-24 DIAGNOSIS — R42 Dizziness and giddiness: Secondary | ICD-10-CM | POA: Diagnosis present

## 2024-01-24 DIAGNOSIS — Z860100 Personal history of colon polyps, unspecified: Secondary | ICD-10-CM

## 2024-01-24 DIAGNOSIS — E860 Dehydration: Secondary | ICD-10-CM | POA: Diagnosis present

## 2024-01-24 DIAGNOSIS — Z7901 Long term (current) use of anticoagulants: Secondary | ICD-10-CM | POA: Diagnosis not present

## 2024-01-24 DIAGNOSIS — Z9049 Acquired absence of other specified parts of digestive tract: Secondary | ICD-10-CM | POA: Diagnosis not present

## 2024-01-24 DIAGNOSIS — K317 Polyp of stomach and duodenum: Secondary | ICD-10-CM | POA: Diagnosis present

## 2024-01-24 DIAGNOSIS — E8809 Other disorders of plasma-protein metabolism, not elsewhere classified: Secondary | ICD-10-CM | POA: Diagnosis present

## 2024-01-24 DIAGNOSIS — K5080 Crohn's disease of both small and large intestine without complications: Secondary | ICD-10-CM | POA: Diagnosis present

## 2024-01-24 DIAGNOSIS — K635 Polyp of colon: Secondary | ICD-10-CM | POA: Diagnosis present

## 2024-01-24 DIAGNOSIS — R195 Other fecal abnormalities: Secondary | ICD-10-CM

## 2024-01-24 DIAGNOSIS — I4891 Unspecified atrial fibrillation: Secondary | ICD-10-CM | POA: Diagnosis not present

## 2024-01-24 DIAGNOSIS — Z8249 Family history of ischemic heart disease and other diseases of the circulatory system: Secondary | ICD-10-CM | POA: Diagnosis not present

## 2024-01-24 DIAGNOSIS — N3281 Overactive bladder: Secondary | ICD-10-CM | POA: Diagnosis present

## 2024-01-24 DIAGNOSIS — D638 Anemia in other chronic diseases classified elsewhere: Secondary | ICD-10-CM

## 2024-01-24 DIAGNOSIS — E785 Hyperlipidemia, unspecified: Secondary | ICD-10-CM | POA: Diagnosis present

## 2024-01-24 DIAGNOSIS — E559 Vitamin D deficiency, unspecified: Secondary | ICD-10-CM | POA: Diagnosis present

## 2024-01-24 DIAGNOSIS — I480 Paroxysmal atrial fibrillation: Secondary | ICD-10-CM | POA: Diagnosis present

## 2024-01-24 DIAGNOSIS — K50018 Crohn's disease of small intestine with other complication: Secondary | ICD-10-CM

## 2024-01-24 DIAGNOSIS — R079 Chest pain, unspecified: Secondary | ICD-10-CM

## 2024-01-24 DIAGNOSIS — D509 Iron deficiency anemia, unspecified: Secondary | ICD-10-CM | POA: Insufficient documentation

## 2024-01-24 DIAGNOSIS — I10 Essential (primary) hypertension: Secondary | ICD-10-CM | POA: Diagnosis present

## 2024-01-24 DIAGNOSIS — H409 Unspecified glaucoma: Secondary | ICD-10-CM | POA: Diagnosis present

## 2024-01-24 DIAGNOSIS — Z8661 Personal history of infections of the central nervous system: Secondary | ICD-10-CM | POA: Diagnosis not present

## 2024-01-24 DIAGNOSIS — E2740 Unspecified adrenocortical insufficiency: Secondary | ICD-10-CM | POA: Diagnosis present

## 2024-01-24 DIAGNOSIS — Z87891 Personal history of nicotine dependence: Secondary | ICD-10-CM | POA: Diagnosis not present

## 2024-01-24 DIAGNOSIS — E538 Deficiency of other specified B group vitamins: Secondary | ICD-10-CM | POA: Diagnosis present

## 2024-01-24 LAB — COMPREHENSIVE METABOLIC PANEL WITH GFR
ALT: 9 U/L (ref 0–44)
AST: 19 U/L (ref 15–41)
Albumin: 2.7 g/dL — ABNORMAL LOW (ref 3.5–5.0)
Alkaline Phosphatase: 69 U/L (ref 38–126)
Anion gap: 11 (ref 5–15)
BUN: 12 mg/dL (ref 8–23)
CO2: 20 mmol/L — ABNORMAL LOW (ref 22–32)
Calcium: 7.6 mg/dL — ABNORMAL LOW (ref 8.9–10.3)
Chloride: 101 mmol/L (ref 98–111)
Creatinine, Ser: 0.93 mg/dL (ref 0.61–1.24)
GFR, Estimated: >60 mL/min
Glucose, Bld: 83 mg/dL (ref 70–99)
Potassium: 3.4 mmol/L — ABNORMAL LOW (ref 3.5–5.1)
Sodium: 131 mmol/L — ABNORMAL LOW (ref 135–145)
Total Bilirubin: 0.3 mg/dL (ref 0.0–1.2)
Total Protein: 5.5 g/dL — ABNORMAL LOW (ref 6.5–8.1)

## 2024-01-24 LAB — ECHOCARDIOGRAM COMPLETE
Area-P 1/2: 2.99 cm2
Calc EF: 47.7 %
Height: 71 in
S' Lateral: 2.8 cm
Single Plane A2C EF: 48.8 %
Single Plane A4C EF: 45.9 %
Weight: 2557.34 [oz_av]

## 2024-01-24 LAB — CBC
HCT: 26 % — ABNORMAL LOW (ref 39.0–52.0)
Hemoglobin: 8.7 g/dL — ABNORMAL LOW (ref 13.0–17.0)
MCH: 28.9 pg (ref 26.0–34.0)
MCHC: 33.5 g/dL (ref 30.0–36.0)
MCV: 86.4 fL (ref 80.0–100.0)
Platelets: 172 K/uL (ref 150–400)
RBC: 3.01 MIL/uL — ABNORMAL LOW (ref 4.22–5.81)
RDW: 21.2 % — ABNORMAL HIGH (ref 11.5–15.5)
WBC: 6.5 K/uL (ref 4.0–10.5)
nRBC: 0 % (ref 0.0–0.2)

## 2024-01-24 LAB — OCCULT BLOOD X 1 CARD TO LAB, STOOL: Fecal Occult Bld: POSITIVE — AB

## 2024-01-24 MED ORDER — IRON SUCROSE 200 MG IVPB - SIMPLE MED
200.0000 mg | Freq: Once | Status: AC
  Administered 2024-01-24: 200 mg via INTRAVENOUS
  Filled 2024-01-24: qty 200

## 2024-01-24 MED ORDER — FOLIC ACID 1 MG PO TABS
1.0000 mg | ORAL_TABLET | Freq: Every day | ORAL | Status: DC
  Administered 2024-01-24 – 2024-01-25 (×2): 1 mg via ORAL
  Filled 2024-01-24 (×2): qty 1

## 2024-01-24 MED ORDER — ORAL CARE MOUTH RINSE: 15.0000 mL | OROMUCOSAL | Status: DC | PRN

## 2024-01-24 MED ORDER — SODIUM CHLORIDE 0.9 % IV SOLN: INTRAVENOUS | Status: AC

## 2024-01-24 NOTE — Progress Notes (Signed)
 FOBT result positive.  No frank blood or coffee ground appearance noted in stool at time of collection.  Stools loose and undigested food present.

## 2024-01-24 NOTE — Progress Notes (Signed)
" °  Echocardiogram 2D Echocardiogram has been performed.  Michael [name] 01/24/2024, 3:28 PM "

## 2024-01-24 NOTE — Telephone Encounter (Signed)
 Patient referred to infusion pharmacy team for ambulatory infusion of IV iron .  Insurance - [employer] Medicare Site of care - Site of care: [facility] Dx code - D64.9 IV Iron  Therapy - Feraheme 510mg  x 1. Venofer  200mg  IV x 1 received inpatient on 01/24/2024 Infusion appointments - Scheduling team will schedule patient as soon as possible.    Sherry Pennant, PharmD, MPH, BCPS, CPP Clinical Pharmacist

## 2024-01-24 NOTE — Evaluation (Signed)
 Occupational Therapy Evaluation Patient Details Name: Michael [name] MRN: [id number] DOB: Jul 19, 1932 Today's Date: 01/24/2024   History of Present Illness   Patient is a 89 yo male presenting to the ED with dizziness on 01/22/24.  Positive for vertigo with nystagmus in the ED and unable to get OOB.  Addtionally, pt with recent URI and founcd to have CAP. MRI and CTA clear.  PMH: Crohn's disease, a fib, aortic atherosclerosis, inguinal hernia, anemia, glaucoma, osteopenia, SBO, TIA, HTN, HLD     Clinical Impressions Prior to this admission, patient living in ILF with a caretaker coming for a few hours in the morning and evening daily. Currently, patient with 2 beat nystagmus when scanning to the R, with vertigo symptoms present (see PT note). Patient also with minimal generalized weakness and min A for ADLs and CGA for functional mobility. Orthostatics WFL. OT will continue to follow acutely, but requires no OT at discharge.      If plan is discharge home, recommend the following:   A little help with walking and/or transfers;A lot of help with bathing/dressing/bathroom;Assist for transportation;Assistance with cooking/housework     Functional Status Assessment   Patient has had a recent decline in their functional status and demonstrates the ability to make significant improvements in function in a reasonable and predictable amount of time.     Equipment Recommendations   None recommended by OT     Recommendations for Other Services         Precautions/Restrictions   Precautions Precautions: Fall Recall of Precautions/Restrictions: Intact Restrictions Weight Bearing Restrictions Per Provider Order: No     Mobility Bed Mobility               General bed mobility comments: up in recliner upon arrival    Transfers Overall transfer level: Needs assistance Equipment used: Rolling walker (2 wheels) Transfers: Sit to/from Stand Sit to Stand: Contact guard  assist           General transfer comment: CGA for safety but pt needing no assist      Balance Overall balance assessment: Needs assistance Sitting-balance support: No upper extremity supported Sitting balance-Leahy Scale: Good     Standing balance support: Bilateral upper extremity supported Standing balance-Leahy Scale: Poor Standing balance comment: steady with RW                           ADL either performed or assessed with clinical judgement   ADL Overall ADL's : Needs assistance/impaired Eating/Feeding: Set up;Sitting   Grooming: Set up;Sitting   Upper Body Bathing: Contact guard assist;Sitting   Lower Body Bathing: Moderate assistance;Sit to/from stand;Sitting/lateral leans   Upper Body Dressing : Contact guard assist;Sitting   Lower Body Dressing: Moderate assistance;Maximal assistance;Sitting/lateral leans;Sit to/from stand   Toilet Transfer: Ambulation;Rolling walker (2 wheels);Contact guard assist   Toileting- Clothing Manipulation and Hygiene: Minimal assistance;Sit to/from stand;Sitting/lateral lean       Functional mobility during ADLs: Minimal assistance;Cueing for safety;Cueing for sequencing;Rolling walker (2 wheels) General ADL Comments: Prior to this admission, patient living in ILF with a caretaker coming for a few hours in the morning and evening daily. Currently, patient with 2 beat nystagmus when scanning to the R, with vertigo symptoms present (see PT note). Patient also with minimal generalized weakness and min A for ADLs and CGA for functional mobility. Orthostatics WFL. OT will continue to follow acutely, but requires no OT at discharge.     Vision  Baseline Vision/History: 1 Wears glasses Ability to See in Adequate Light: 1 Impaired Patient Visual Report: No change from baseline Vision Assessment?: Yes Eye Alignment: Within Functional Limits Ocular Range of Motion: Within Functional Limits Alignment/Gaze Preference: Within  Defined Limits Tracking/Visual Pursuits: Decreased smoothness of vertical tracking Saccades: Decreased speed of saccadic movement;Undershoots Convergence: Within functional limits Additional Comments: 2 beat nystagmus when looking right     Perception Perception: Within Functional Limits       Praxis Praxis: WFL       Pertinent Vitals/Pain Pain Assessment Pain Assessment: No/denies pain     Extremity/Trunk Assessment Upper Extremity Assessment Upper Extremity Assessment: Generalized weakness;Right hand dominant   Lower Extremity Assessment Lower Extremity Assessment: Overall WFL for tasks assessed   Cervical / Trunk Assessment Cervical / Trunk Assessment: Kyphotic   Communication Communication Communication: Impaired Factors Affecting Communication: Hearing impaired   Cognition Arousal: Alert Behavior During Therapy: WFL for tasks assessed/performed Cognition: No apparent impairments                               Following commands: Intact       Cueing  General Comments   Cueing Techniques: Verbal cues  VSS   Exercises     Shoulder Instructions      Home Living Family/patient expects to be discharged to:: Private residence Living Arrangements: Alone Available Help at Discharge: Personal care attendant Type of Home: Independent living facility Home Access: Level entry     Home Layout: One level     Bathroom Shower/Tub: Walk-in Human Resources Officer: Handicapped height     Home Equipment: Agricultural Consultant (2 wheels);Shower seat;Grab bars - tub/shower;Grab bars - toilet   Additional Comments: Pt at ILF at [facility]; Private caregiver everyday couple hours in morning and evening      Prior Functioning/Environment               Mobility Comments: Pt normally ambulatory without AD; did use RW when alone ADLs Comments: Pt independent with all ADLs.    OT Problem List: Decreased range of motion;Decreased activity  tolerance;Decreased coordination   OT Treatment/Interventions: Self-care/ADL training;Therapeutic exercise;DME and/or AE instruction;Energy conservation;Therapeutic activities;Patient/family education;Balance training      OT Goals(Current goals can be found in the care plan section)   Acute Rehab OT Goals Patient Stated Goal: to get better OT Goal Formulation: With patient Time For Goal Achievement: 02/07/24 Potential to Achieve Goals: Good   OT Frequency:  Min 2X/week    Co-evaluation   Reason for Co-Treatment: For patient/therapist safety          AM-PAC OT 6 Clicks Daily Activity     Outcome Measure Help from another person eating meals?: A Little Help from another person taking care of personal grooming?: A Little Help from another person toileting, which includes using toliet, bedpan, or urinal?: A Little Help from another person bathing (including washing, rinsing, drying)?: A Little Help from another person to put on and taking off regular upper body clothing?: A Little Help from another person to put on and taking off regular lower body clothing?: A Lot 6 Click Score: 17   End of Session Equipment Utilized During Treatment: Gait belt;Rolling walker (2 wheels) Nurse Communication: Mobility status  Activity Tolerance: Patient tolerated treatment well Patient left: in chair;with call bell/phone within reach;with chair alarm set;Other (comment) (Aide present)  OT Visit Diagnosis: Other abnormalities of gait and mobility (R26.89);Muscle weakness (  generalized) (M62.81)                Time: 8849-8790 OT Time Calculation (min): 19 min Charges:  OT General Charges $OT Visit: 1 Visit OT Evaluation $OT Eval Moderate Complexity: 1 Mod  Ronal Gift E. Kemiya Batdorf, OTR/L Acute Rehabilitation Services [phone]   Ronal Gift Salt 01/24/2024, 3:50 PM

## 2024-01-24 NOTE — Evaluation (Signed)
 Physical Therapy Evaluation Patient Details Name: Michael [name] MRN: [id number] DOB: 1932/02/10 Today's Date: 01/24/2024  History of Present Illness  Patient is a 89 yo male presenting to the ED with dizziness on 01/22/24.  Positive for vertigo with nystagmus in the ED and unable to get OOB.  Addtionally, pt with recent URI and founcd to have CAP. MRI and CTA clear.  PMH: Crohn's disease, a fib, aortic atherosclerosis, inguinal hernia, anemia, glaucoma, osteopenia, SBO, TIA, HTN, HLD  Clinical Impression   Pt admitted with above diagnosis. Pt moving much better than at admission.  He denied any dizziness or lightheadedness during assessment.  Pt reports symptoms were more lightheaded and occurred when he got OOB.  Pt's was in chair at arrival but orthostatic BP were stable between sitting and standing.  Pt ambulating 100' with RW and CGA.  He did have R beating nystagmus with R gaze and + head thrust bilaterally consistent with vestibular hypofunction particularly with recent URI.  Did not test [facility] as symptoms resolving and not consistent with BPPV.  Educated on gaze stabilization exercise.  Pt currently with functional limitations due to the deficits listed below (see PT Problem List). Pt will benefit from acute skilled PT to increase their independence and safety with mobility to allow discharge.  Recommend HHPT at d/c.          If plan is discharge home, recommend the following: A little help with walking and/or transfers;A little help with bathing/dressing/bathroom;Assistance with cooking/housework;Help with stairs or ramp for entrance   Can travel by private vehicle        Equipment Recommendations None recommended by PT  Recommendations for Other Services       Functional Status Assessment Patient has had a recent decline in their functional status and demonstrates the ability to make significant improvements in function in a reasonable and predictable amount of time.      Precautions / Restrictions Precautions Precautions: Fall      Mobility  Bed Mobility               General bed mobility comments: in chair at arrival - pt's personal aide had him up and showered earlier today    Transfers Overall transfer level: Needs assistance Equipment used: Rolling walker (2 wheels) Transfers: Sit to/from Stand Sit to Stand: Contact guard assist           General transfer comment: CGA for safety but pt needing no assist    Ambulation/Gait Ambulation/Gait assistance: Contact guard assist Gait Distance (Feet): 100 Feet Assistive device: Rolling walker (2 wheels) Gait Pattern/deviations: Step-through pattern Gait velocity: decreased     General Gait Details: CGA for safety, but no LOB and denied dizziness  Stairs            Wheelchair Mobility     Tilt Bed    Modified Rankin (Stroke Patients Only)       Balance Overall balance assessment: Needs assistance Sitting-balance support: No upper extremity supported Sitting balance-Leahy Scale: Good     Standing balance support: Bilateral upper extremity supported Standing balance-Leahy Scale: Poor Standing balance comment: steady with RW                             Pertinent Vitals/Pain Pain Assessment Pain Assessment: No/denies pain    Home Living Family/patient expects to be discharged to:: Private residence Living Arrangements: Alone Available Help at Discharge: Personal care attendant Type  of Home: Independent living facility Home Access: Level entry       Home Layout: One level Home Equipment: Agricultural Consultant (2 wheels);Shower seat;Grab bars - tub/shower;Grab bars - toilet Additional Comments: Pt at ILF at [facility]; Private caregiver everyday couple hours in morning and evening    Prior Function               Mobility Comments: Pt normally ambulatory without AD; did use RW when alone ADLs Comments: Pt independent with all ADLs.      Extremity/Trunk Assessment   Upper Extremity Assessment Upper Extremity Assessment: Defer to OT evaluation    Lower Extremity Assessment Lower Extremity Assessment: Overall WFL for tasks assessed    Cervical / Trunk Assessment Cervical / Trunk Assessment: Kyphotic  Communication   Communication Communication: Impaired Factors Affecting Communication: Hearing impaired    Cognition Arousal: Alert Behavior During Therapy: WFL for tasks assessed/performed   PT - Cognitive impairments: No apparent impairments                                 Cueing       General Comments General comments (skin integrity, edema, etc.): VSS - orthostatic BP were negative  Vestibular assessment:  Pt reports recent URI.  Also reports hearing aides recently changed and when at [facility] they worked on L ear removing some wax.  Denies any falls.  Reports dizzy symptoms where more lightheadedness when he first gets OOB. He had no symptoms today during evaluation.   Orthostatic BP: pt sitting at arrival but BP stable between sitting and standing.   Spontaneous Nystagmus: negative Gaze Induced: + R beating with R gaze Smooth Pursuit: increased time and cues but coordinated Gaze stabilization: intact but slow  Head Thrust: + bilaterally  Pt denies symptoms with rolling or head turns and reports symptoms have resolved.  As pt did have orthostatic hypotension at admission, recent URI, and signs indicative of vestibular hypofunction did not further test [facility].   Exercises     Assessment/Plan    PT Assessment Patient needs continued PT services  PT Problem List Decreased strength;Decreased range of motion;Decreased activity tolerance;Decreased balance;Decreased mobility;Decreased knowledge of use of DME       PT Treatment Interventions DME instruction;Therapeutic exercise;Gait training;Functional mobility training;Therapeutic activities;Patient/family education;Balance  training;Neuromuscular re-education    PT Goals (Current goals can be found in the Care Plan section)  Acute Rehab PT Goals Patient Stated Goal: return home PT Goal Formulation: With patient/family Time For Goal Achievement: 02/07/24 Potential to Achieve Goals: Good    Frequency Min 2X/week     Co-evaluation PT/OT/SLP Co-Evaluation/Treatment: Yes Reason for Co-Treatment: For patient/therapist safety           AM-PAC PT 6 Clicks Mobility  Outcome Measure Help needed turning from your back to your side while in a flat bed without using bedrails?: A Little Help needed moving from lying on your back to sitting on the side of a flat bed without using bedrails?: A Little Help needed moving to and from a bed to a chair (including a wheelchair)?: A Little Help needed standing up from a chair using your arms (e.g., wheelchair or bedside chair)?: A Little Help needed to walk in hospital room?: A Little Help needed climbing 3-5 steps with a railing? : A Little 6 Click Score: 18    End of Session Equipment Utilized During Treatment: Gait belt Activity Tolerance: Patient  tolerated treatment well Patient left: in chair;with chair alarm set;with call bell/phone within reach;with family/visitor present Nurse Communication: Mobility status PT Visit Diagnosis: Other abnormalities of gait and mobility (R26.89);Dizziness and giddiness (R42)    Time: 8849-8790 PT Time Calculation (min) (ACUTE ONLY): 19 min   Charges:   PT Evaluation $PT Eval Low Complexity: 1 Low   PT General Charges $$ ACUTE PT VISIT: 1 Visit         Benjiman, PT Acute Rehab [facility] [phone]   Benjiman VEAR Mulberry 01/24/2024, 2:15 PM

## 2024-01-24 NOTE — Consult Note (Addendum)
 "      Consultation  Referring Provider:  [facility]  Primary Care Physician:  [name], MD Primary Gastroenterologist:  [name]       Reason for Consultation:    Positive fecal occult, acute on chronic anemia  LOS: 0 days          HPI:   Michael [name] is a 89 y.o. male with past medical history significant with medical history significant of adrenal insufficiency, unspecified anemia, paroxysmal atrial fibrillation, history of DVT, colon polyps, Crohn's disease, glaucoma, pseudophakia of both sides, vitreous prolapse of the right eye, recurrent bilateral inguinal hernia, male hypogonadism, osteopenia, retinal ischemia, B12 deficiency, vitamin D  deficiency, history of pneumonia, thrombocytopenia, history of encephalitis, history of seizure-like activity, small bowel obstruction, history of TIA, hypertension, hyperlipidemia, aortic atherosclerosis, mild cognitive impairment, overactive bladder, bilateral hearing loss presents for acute on chronic anemia  Patient has history of multiple tubular adenomas with colonoscopy 01/2019 with 20 polyps removed (most adenomas), colonoscopy 03/2019 with numerous tubular adenomas, colonoscopy 08/2019 with 2 tubular adenomas and most recent colonoscopy 03/2020 identified a polypoid lesion in the ascending colon, path report showed fragments of polypoid colonic mucosa with low-grade dysplasia with a tubular architecture and an inflammatory polyp.   Patient originally presented to the ED Monday 1/12 for dizziness/vertigo which was improved on meclizine .  He was noted to have a mild cough and diagnosed with pneumonia in which antibiotics were started.  Fecal occult obtained was positive.  Appears Hgb since 10/2023 has slowly drifted from 11 down to 8 with most recent drawl yesterday 8.2.  Workup notable for - Iron  26, TIBC 165, saturation 16%, ferritin 239 - BUN 13, CR 0.79 - Hgb 8.1, MCV 85.5, RDW 21.2  Patient is afebrile, hemodynamically stable, and  currently receiving IV iron , IVF, and antibiotics.  He is not having any overt bleeding or other GI symptoms   PREVIOUS GI WORKUP   Colonoscopy 12/2015 - [name] - normal colon, patent ileocolonic anastomosis, active Crohn's ileitis   Colonoscopy 01/24/2019  - Preparation of the colon was fair. - Crohn's disease with mild ileitis. - Patent end-to-side ileo-colonic anastomosis, characterized by mild stenosis. Biopsied. - Abnormal mucosa in the ascending colon. Biopsied. - Two 3 to 6 mm polyps in the ascending colon, removed with a cold snare. Resected and retrieved. - Eight 4 to 12 mm polyps in the transverse colon, removed with a cold snare. Resected and retrieved. - One large polyp in the transverse colon. Tattooed. Not removed as outlined above - One 10 mm polyp at the splenic flexure, removed with a cold snare. Resected and retrieved. - One 10 mm polyp in the descending colon, removed with a cold snare. Resected and retrieved. - Five 5 to 10 mm polyps in the sigmoid colon, removed with a cold snare. Resected and retrieved. - Two 4 mm polyps polyps at the recto-sigmoid colon, removed with a cold snare. Resected and retrieved. - Diverticulosis in the entire examined colon. - Stool in the entire examined colon. - Colonic spasm. - The examination was otherwise normal.   Most polyps adenomatous Biopsies of right sided abnormality adenomatous   Colonoscopy 03/25/19  - Preparation of the colon was fair with significant spasm which prolonged this exam - Active ileal Crohn's disease as described - [name] i2. - Patent end-to-side ileo-colonic anastomosis, characterized by inflammation. - Numerous flat polyps noted in the ascending colon and proximal transverse colon as noted. Several minutes spent lavaging the colon and methylene  blue used to evaluate the right colon to better delineate some of these especially the largest lesion which may extend into the surgical anastomosis.  Largest transverse polyp removed as outlined, with another large transverse polyp noted today which was not removed given duration of procedure and colonic spasm.   FINAL MICROSCOPIC DIAGNOSIS:   A. COLON, RIGHT, POLYPECTOMY:  - Tubular adenoma (x2 fragments).  - No high grade dysplasia or malignancy.   B. COLON, RIGHT NEAR ANASTAMOSIS, POLYPECTOMY:  - Tubular adenoma (multiple fragments).  - No high grade dysplasia or malignancy.   C. COLON, PROXIMAL TRANSVERSE, POLYPECTOMY:  - Tubular adenoma (multiple fragments).  - No high grade dysplasia or malignancy.   D. COLON, TRANSVERSE, LARGE, POLYPECTOMY:  - Tubular adenoma (multiple fragments).  - No high grade dysplasia or malignancy.      Colonoscopy 08/26/19 - Crohn's disease with mildly active ileitis. - Patent end-to-side ileo-colonic anastomosis. - Prior polypectomy site in the ascending colon, unclear if residual polyp vs. normal variant ileal tissue given location at the anatomosis. Area removed piecemeal using a cold snare. Resected and retrieved. - Chromoendoscopy applied to the proximal transverse and right colon. - Two 5 to 20 mm polyps in the transverse colon, removed piecemeal using a cold snare. Resected and retrieved. - Diverticulosis in the transverse colon and in the left colon. - The examination was otherwise normal. - Spatic colon treated with glucagon . Overall, very challenging exam, time needed to clear the colon, visualization difficult given spasm. I think the colon at this point has been cleared of all high risk lesions however difficult to say if there is residual / recurrence at the polyp site near the anastomosis. Under normal circumstances surgery would be recommended to remove right colon and anastomosis however given patient's age he has elected for colonoscopy surveillance.    FINAL MICROSCOPIC DIAGNOSIS:   A. COLON, RIGHT, SNARE POLYPECTOMY:  - Tubular adenoma(s)  - Negative for high-grade  dysplasia or malignancy   B. COLON, TRANSVERSE, SNARE POLYPECTOMY:  - Tubular adenoma(s)  - Negative for high-grade dysplasia or malignancy   C. COLON, TRANSVERSE, SNARE POLYPECTOMY:  - Tubular adenoma(s)  - Negative for high-grade dysplasia or malignancy     Colonoscopy 03/19/20 - - Crohn's disease with mildly active ileitis. No overt polypoid tissue at the entrance of the ileum but multiple biopsies taken. - Patent end-to-end ileo-colonic anastomosis. - ? polypoid lesion in the ascending colon just distal to the anastomosis, in close approximation, vs. scar tissue or normal variant as above, very difficult to see clear borders. Area was removed with cold snare, - Diverticulosis in the transverse colon and in the left colon. - The examination was otherwise normal. No high risk lesions otherwise appreciated.   FINAL MICROSCOPIC DIAGNOSIS:   A. COLON, ASCENDING, POLYPECTOMY:  - Fragments of polypoid colonic mucosa showing low-grade dysplasia with  a tubular architecture.  See comment  - Inflammatory polyp  - Other fragments of polypoid colonic mucosa with no specific  histopathologic changes   B. SURGICAL ANASTAMOSIS, BIOPSY:  - Colonic mucosa with nonspecific inflammatory and architectural  changes, consistent with anastomotic site  - Negative for granulomas or dysplasia    EGD 11/15/2023 ([name] inpatient) - Benign- appearing esophageal stenosis. Dilated.  - A single gastric polyp. Resected and retrieved.  - The examination was otherwise normal.   Flex sig 11/2023 ([name] for fecal impaction) - Preparation of the colon was poor. ( unprepped)  - Soft fecal impaction found on digital rectal  exam.  - Stool in the rectum and in the recto- sigmoid colon.  - No specimens collected.   Echo 09/23/20 - EF 55-60%, grade I DD  Past Medical History:  Diagnosis Date   Adrenal insufficiency    Anemia    Atrial fibrillation (HCC)    Avascular necrosis (HCC)    Clavicle  fracture 11/02/2017   Colon polyp    Colon polyps    Crohn's disease (HCC)    Glaucoma    Inguinal hernia recurrent bilateral    Low testosterone     Osteopenia    Retinal ischemia     Surgical History:  He  has a past surgical history that includes Tonsillectomy; Abdominal surgery; Inguinal hernia repair (Bilateral, 05/21/2014); Colonoscopy (N/A, 12/13/2015); Small intestine surgery (1968, 2001 ); Colonoscopy with propofol  (N/A, 03/25/2019); polypectomy (03/25/2019); Colonoscopy with propofol  (N/A, 08/26/2019); Endoscopic mucosal resection (N/A, 08/26/2019); polypectomy (08/26/2019); Schlerotherapy (08/26/2019); Colonoscopy with propofol  (N/A, 03/19/2020); polypectomy (03/19/2020); biopsy (03/19/2020); Esophagogastroduodenoscopy (N/A, 11/15/2023); and Flexible sigmoidoscopy (N/A, 11/15/2023). Family History:  His family history includes Cancer in his cousin, cousin, and maternal aunt; Deep vein thrombosis in his mother; Heart disease in his father; Transient ischemic attack in his mother. Social History:   reports that he quit smoking about 47 years ago. His smoking use included cigarettes. He has never used smokeless tobacco. He reports current alcohol use of about 2.0 standard drinks of alcohol per week. He reports that he does not use drugs.  Prior to Admission medications  Medication Sig Start Date End Date Taking? Authorizing Provider  acetaminophen  (TYLENOL ) 500 MG tablet Take 500-1,000 mg by mouth every 8 (eight) hours as needed for mild pain or headache.    Yes [provider]  apixaban  (ELIQUIS ) 5 MG TABS tablet Take 1 tablet (5 mg total) by mouth 2 (two) times daily. 09/06/21  Yes Verlin Lonni BIRCH, MD  Ascorbic Acid  (VITAMIN C ) 1000 MG tablet Take 1,000 mg by mouth daily with breakfast.    Yes [provider]  augmented betamethasone dipropionate (DIPROLENE-AF) 0.05 % cream Apply 1 application  topically 2 (two) times daily as needed (Grover's disease- legs). 10/03/17  Yes  [provider]  brimonidine  (ALPHAGAN ) 0.2 % ophthalmic solution Place 1 drop into the left eye in the morning and at bedtime.   Yes [provider]  cyanocobalamin  (VITAMIN B12) 1000 MCG/ML injection INJECT 1ML INTO THE MUSCLE ONCE MONTHLY 09/27/23  Yes Burns, Glade PARAS, MD  dorzolamide -timolol  (COSOPT ) 22.3-6.8 MG/ML ophthalmic solution Place 1 drop into the left eye in the morning and at bedtime. 11/05/15  Yes [provider]  latanoprost  (XALATAN ) 0.005 % ophthalmic solution Place 1 drop into the left eye at bedtime. 09/12/17  Yes [provider]  levETIRAcetam  (KEPPRA ) 750 MG tablet Take 1 tablet (750 mg total) by mouth 2 (two) times daily. 10/30/23  Yes Gonfa, Taye T, MD  metoprolol  succinate (TOPROL -XL) 25 MG 24 hr tablet Take 0.5 tablets (12.5 mg total) by mouth daily. 11/06/23  Yes Burns, Glade PARAS, MD  Netarsudil  Dimesylate 0.02 % SOLN Place 1 drop into the left eye at bedtime. Rhopressa    Yes [provider]  pantoprazole  (PROTONIX ) 40 MG tablet Take 1 tablet (40 mg total) by mouth daily before breakfast. Patient taking differently: Take 40 mg by mouth as needed. 11/18/23  Yes Sebastian Toribio GAILS, MD  polyethylene glycol (MIRALAX  / GLYCOLAX ) 17 g packet Take 17 g by mouth daily. 11/18/23  Yes Sebastian Toribio GAILS, MD  TUMS 500 MG  chewable tablet Chew 1 tablet by mouth 3 (three) times daily as needed for indigestion or heartburn.   Yes [provider]  sertraline  (ZOLOFT ) 25 MG tablet Take 1 tablet (25 mg total) by mouth daily. Patient not taking: Reported on 01/22/2024 11/06/23   [name], MD    Current Facility-Administered Medications  Medication Dose Route Frequency Provider Last Rate Last Admin   0.9 %  sodium chloride  infusion   Intravenous Continuous Rizwan, Saima, MD 50 mL/hr at 01/24/24 1300 New Bag at 01/24/24 1300   acetaminophen  (TYLENOL ) tablet 650 mg  650 mg Oral Q6H PRN Celinda Alm Lot, MD       Or   acetaminophen   (TYLENOL ) suppository 650 mg  650 mg Rectal Q6H PRN Celinda Alm Lot, MD       apixaban  (ELIQUIS ) tablet 5 mg  5 mg Oral BID Celinda Alm Lot, MD   5 mg at 01/24/24 1039   azithromycin  (ZITHROMAX ) tablet 500 mg  500 mg Oral Daily Paudel, Nena, MD   500 mg at 01/24/24 1038   brimonidine  (ALPHAGAN ) 0.2 % ophthalmic solution 1 drop  1 drop Left Eye BID Celinda Alm Lot, MD   1 drop at 01/24/24 1042   cefTRIAXone  (ROCEPHIN ) 2 g in sodium chloride  0.9 % 100 mL IVPB  2 g Intravenous Q24H Paudel, Nena, MD 200 mL/hr at 01/24/24 1303 2 g at 01/24/24 1303   dorzolamide -timolol  (COSOPT ) 2-0.5 % ophthalmic solution 1 drop  1 drop Left Eye BID Celinda Alm Lot, MD   1 drop at 01/24/24 1042   folic acid  (FOLVITE ) tablet 1 mg  1 mg Oral Daily Rizwan, Saima, MD   1 mg at 01/24/24 1039   latanoprost  (XALATAN ) 0.005 % ophthalmic solution 1 drop  1 drop Left Eye QHS Celinda Alm Lot, MD   1 drop at 01/23/24 2208   levETIRAcetam  (KEPPRA ) tablet 750 mg  750 mg Oral BID Celinda Alm Lot, MD   750 mg at 01/24/24 1039   meclizine  (ANTIVERT ) tablet 25 mg  25 mg Oral TID PRN Celinda Alm Lot, MD   25 mg at 01/22/24 2350   metoprolol  succinate (TOPROL -XL) 24 hr tablet 12.5 mg  12.5 mg Oral Daily Celinda Alm Lot, MD   12.5 mg at 01/24/24 1038   Netarsudil  Dimesylate 0.02 % SOLN 1 drop  1 drop Left Eye QHS Celinda Alm Lot, MD   1 drop at 01/23/24 2202   ondansetron  (ZOFRAN ) tablet 4 mg  4 mg Oral Q6H PRN Celinda Alm Lot, MD       Or   ondansetron  (ZOFRAN ) injection 4 mg  4 mg Intravenous Q6H PRN Celinda Alm Lot, MD       Oral care mouth rinse  15 mL Mouth Rinse PRN Paudel, Keshab, MD       pantoprazole  (PROTONIX ) EC tablet 40 mg  40 mg Oral QAC breakfast Celinda Alm Lot, MD   40 mg at 01/24/24 0744   polyethylene glycol (MIRALAX  / GLYCOLAX ) packet 17 g  17 g Oral Daily Celinda Alm Lot, MD   17 g at 01/23/24 0908   pyridOXINE  (VITAMIN B6) tablet 50 mg  50 mg Oral Daily Celinda Alm Lot, MD   50 mg at 01/24/24 1039   sertraline  (ZOLOFT ) tablet 25 mg  25 mg Oral Daily Celinda Alm Lot, MD   25 mg at 01/24/24 1039    Allergies as of 01/22/2024   (No Known Allergies)    Review of Systems  Constitutional:  Positive for malaise/fatigue. Negative for chills, fever and weight loss.  HENT:  Negative for hearing loss and tinnitus.   Eyes:  Positive for double vision.  Respiratory:  Negative for cough and hemoptysis.   Cardiovascular:  Negative for chest pain and palpitations.  Gastrointestinal:  Negative for abdominal pain, blood in stool, constipation, diarrhea, heartburn, melena, nausea and vomiting.  Genitourinary:  Negative for flank pain and hematuria.  Musculoskeletal:  Negative for myalgias and neck pain.  Skin:  Negative for itching and rash.  Neurological:  Positive for dizziness. Negative for headaches.  Psychiatric/Behavioral:  Negative for depression and suicidal ideas.        Physical Exam:  Vital signs in last 24 hours: Temp:  [97.2 F (36.2 C)-98.5 F (36.9 C)] 97.7 F (36.5 C) (01/14 1336) Pulse Rate:  [72-95] 81 (01/14 1336) Resp:  [14-27] 14 (01/14 1336) BP: (115-153)/(42-99) 115/63 (01/14 1336) SpO2:  [90 %-100 %] 100 % (01/14 1336) Last BM Date : 01/24/24 Last BM recorded by nurses in past 5 days Stool Type: Type 6 (Mushy consistency with ragged edges) (01/24/2024  2:00 AM)  Physical Exam Constitutional:      Appearance: He is ill-appearing.     Comments: Appears younger than stated age  HENT:     Nose: Nose normal. No congestion.  Eyes:     General: No scleral icterus.    Extraocular Movements: Extraocular movements intact.  Cardiovascular:     Rate and Rhythm: Normal rate and regular rhythm.  Pulmonary:     Effort: Pulmonary effort is normal.     Breath sounds: Normal breath sounds.  Abdominal:     General: Bowel sounds are normal. There is no distension.     Palpations: Abdomen is soft. There is no mass.      Tenderness: There is no abdominal tenderness. There is no guarding or rebound.     Hernia: No hernia is present.  Musculoskeletal:        General: No swelling. Normal range of motion.  Skin:    General: Skin is warm and dry.     Coloration: Skin is pale.  Neurological:     General: No focal deficit present.     Mental Status: He is oriented to person, place, and time.  Psychiatric:        Mood and Affect: Mood normal.        Behavior: Behavior normal.        Thought Content: Thought content normal.        Judgment: Judgment normal.      LAB RESULTS: Recent Labs    01/22/24 1025 01/22/24 1037 01/23/24 0535  WBC 6.1  --  5.4  HGB 9.0* 9.2* 8.1*  HCT 26.6* 27.0* 24.1*  PLT 174  --  164   BMET Recent Labs    01/22/24 1025 01/22/24 1037 01/23/24 0535  NA 137 139 135  K 3.6 3.6 3.8  CL 105 103 104  CO2 24  --  23  GLUCOSE 122* 116* 90  BUN 14 15 13   CREATININE 0.93 0.90 0.79  CALCIUM  8.1*  --  8.0*   LFT Recent Labs    01/23/24 0535  PROT 5.5*  ALBUMIN 2.6*  AST 24  ALT 10  ALKPHOS 74  BILITOT 0.8   PT/INR Recent Labs    01/22/24 1025  LABPROT 18.9*  INR 1.5*    STUDIES: No results found.    Impression    Acute on chronic anemia  Heme positive stool Multiple colonoscopies in 2021 with numerous tubular adenomas and last colonoscopy in 2022 with with polyploid lesion with biopsies showing fragments of polypoid colonic mucosa with low-grade dysplasia with a tubular architecture and an inflammatory polyp.  He did have a flexible sigmoidoscopy 11/2023 for disimpaction and EGD 11/2023 which was overall unrevealing other than a gastric polyp. Positive fecal occult No overt bleeding Normal BUN/CR Hgb 8.1 (down from 9.2 yesterday) with a slow drift down from 10 10/2023.  Extensive discussion with daughter and patient about options moving forward including conservative care versus colonoscopy.  Patient certainly appears younger than stated age and tolerated  procedures a few months ago without difficulty so could be a candidate for colonoscopy, however, with how weak he is feeling during this admission unsure if he could tolerate the prep.  He does have a significant polyp history per previous colonoscopies.  Ultimately daughter would like to think about next up moving forward and decide endoscopic evaluation or conservative management.  Patient does have a CHA?DS?-VASc score greater than 2, however, may need to weigh out risks versus benefits of remaining on Eliquis  with this chronic anemia   Crohn's disease Colonoscopy 03/2020 showed Crohn's disease with mild active ileitis, patent end-to-end ileocolonic anastomosis with nonspecific inflammatory changes without granulomas or dysplasia.  CT chest abdomen pelvis with contrast 11/2023 with active Crohn's disease in colon remnant in distal ileum.  Patient adamantly recommendation for biologic therapy  A-fib On Eliquis  last dose 1/14 AM    Plan   -Recheck CBC/CMP today - Continue supportive care - Continue daily CBC and transfuse as needed to maintain HGB > 7 - Daughter to discuss further intervention with patient and make joint decision whether they would like to pursue current steroid of management versus possible endoscopic evaluation if patient can tolerate colonoscopy prep - IV iron   Thank you for your kind consultation, we will continue to follow.   Liyat Faulkenberry CHRISTELLA Blower  01/24/2024, 2:34 PM  "

## 2024-01-24 NOTE — Progress Notes (Signed)
 " [facility] Progress Note  Patient: Michael [name]     FMW:[id number]  DOA: 01/22/2024   PCP: [name], MD       Brief hospital course: This is a 89 year old male with paroxysmal atrial fibrillation, history of DVT, and active Crohn's disease, B12 deficiency who presented to the hospital for feeling dizzy upon waking which was worse with movement.  He tried to use his walker but was unable to ambulate.  In the ED he was nauseated and had vomiting. Chest x-ray revealed a hazy opacity in the left lung base consistent with atelectasis versus infection.  Subjective:  Caretaker at bedside states that the patient has had cough and runny nose for about a week now.  The patient is mainly concerned about his runny nose.  He states that his dizziness is gone and he feels great.  He did not admit to a spinning sensation but said that he mainly felt lightheaded.  Assessment and Plan: Principal Problem:   Dizziness - Based on my conversation with the patient's daughter, this resolved quickly after being given meclizine  - May have been vertigo related to recent infection-has resolved completely  Active Problems: Pneumonia - Possible left lower lobe pneumonia - Patient has had respiratory symptoms - Continue ceftriaxone  and azithromycin   Hemoccult positive stool - History of inactive Crohn's disease but also has had esophageal dilatation - Have consulted GI-they would like to hold off on luminal eval and request that he follow-up with [name] after discharge - Of note patient had an attempt at a sigmoidoscopy on 11/5 however prep was poor - Continue daily Protonix   Normocytic anemia - Baseline hemoglobin is about 10 - Hemoglobin is now about 8 - Anemia panel reveals iron  saturation of 16 with a low TIBC and a ferritin of 239 (which may be an acute phase reactant) - Folate level is 5.4 - Giving IV iron  today and daily folic acid   Mild troponin elevation - Check 2D  echo    PAF (paroxysmal atrial fibrillation) -continue Eliquis  and metoprolol   Dehydration - Start normal saline at 50 cc an hour    Mild cognitive impairment - History obtained from daughter and caregiver who is at bedside  History of seizures - Continue Keppra   Glaucoma - Continue Alphagan  and Cosopt   Hypoalbuminemia - Albumin level 2.6    Code Status: Full Code Total time on patient care: 45 minutes DVT prophylaxis: Eliquis   Objective:   Vitals:   01/24/24 0130 01/24/24 0504 01/24/24 0947 01/24/24 1336  BP: (!) 153/89 135/77 (!) 120/51 115/63  Pulse: 81 80 95 81  Resp: 18 17 16 14   Temp: 98.1 F (36.7 C) 98.1 F (36.7 C) (!) 97.5 F (36.4 C) 97.7 F (36.5 C)  TempSrc: Oral Oral Oral Oral  SpO2: 94% 92% 100% 100%  Weight:      Height:       Filed Weights   01/22/24 1027  Weight: 72.5 kg   Exam: General exam: Appears comfortable  HEENT: oral mucosa moist Respiratory system: Clear to auscultation.  Cardiovascular system: S1 & S2 heard  Gastrointestinal system: Abdomen soft, non-tender, nondistended. Normal bowel sounds   Extremities: No cyanosis, clubbing or edema Psychiatry:  Mood & affect appropriate.    CBC: Recent Labs  Lab 01/22/24 1025 01/22/24 1037 01/23/24 0535 01/24/24 1519  WBC 6.1  --  5.4 6.5  NEUTROABS 3.0  --   --   --   HGB 9.0* 9.2* 8.1* 8.7*  HCT 26.6*  27.0* 24.1* 26.0*  MCV 85.8  --  85.5 86.4  PLT 174  --  164 172   Basic Metabolic Panel: Recent Labs  Lab 01/22/24 1025 01/22/24 1037 01/23/24 0535  NA 137 139 135  K 3.6 3.6 3.8  CL 105 103 104  CO2 24  --  23  GLUCOSE 122* 116* 90  BUN 14 15 13   CREATININE 0.93 0.90 0.79  CALCIUM  8.1*  --  8.0*     Scheduled Meds:  apixaban   5 mg Oral BID   azithromycin   500 mg Oral Daily   brimonidine   1 drop Left Eye BID   dorzolamide -timolol   1 drop Left Eye BID   folic acid   1 mg Oral Daily   latanoprost   1 drop Left Eye QHS   levETIRAcetam   750 mg Oral BID   metoprolol   succinate  12.5 mg Oral Daily   Netarsudil  Dimesylate  1 drop Left Eye QHS   pantoprazole   40 mg Oral QAC breakfast   polyethylene glycol  17 g Oral Daily   pyridOXINE   50 mg Oral Daily   sertraline   25 mg Oral Daily    Imaging and lab data personally reviewed   Author: Gautham Hewins  01/24/2024 4:21 PM  To contact [facility]>   Check the care team in [facility] and look for the attending/consulting [facility] provider listed  Log into [email or web address] and use [facility] universal password   Go to> [facility]  and find provider  If you still have difficulty reaching the provider, please page the [facility] (Director on Call) for the Hospitalists listed on amion     "

## 2024-01-24 NOTE — Plan of Care (Signed)
°  Problem: Clinical Measurements: °Goal: Diagnostic test results will improve °Outcome: Progressing °  °Problem: Clinical Measurements: °Goal: Respiratory complications will improve °Outcome: Progressing °  °Problem: Clinical Measurements: °Goal: Cardiovascular complication will be avoided °Outcome: Progressing °  °Problem: Activity: °Goal: Risk for activity intolerance will decrease °Outcome: Progressing °  °Problem: Nutrition: °Goal: Adequate nutrition will be maintained °Outcome: Progressing °  °

## 2024-01-24 NOTE — Telephone Encounter (Signed)
 Michael [name], the patient will be scheduled as soon as possible.   Auth Submission: NO AUTH NEEDED Site of care: Site of care: [facility] Payer: [facility] Medicare/VA Medication & CPT/J Code(s) submitted: Feraheme (ferumoxytol) [id number] Diagnosis Code: D50.9 Route of submission (phone, fax, portal): portal  Phone # Fax # Auth type: Buy/Bill PB Units/visits requested: 510 mg x 1 Reference number: [id number] Approval from: 01/24/2024 to 05/09/2024

## 2024-01-25 ENCOUNTER — Encounter: Payer: Self-pay | Admitting: Pulmonary Disease

## 2024-01-25 ENCOUNTER — Other Ambulatory Visit (HOSPITAL_COMMUNITY): Payer: Self-pay

## 2024-01-25 DIAGNOSIS — J189 Pneumonia, unspecified organism: Secondary | ICD-10-CM | POA: Diagnosis not present

## 2024-01-25 DIAGNOSIS — R195 Other fecal abnormalities: Secondary | ICD-10-CM

## 2024-01-25 DIAGNOSIS — K50018 Crohn's disease of small intestine with other complication: Secondary | ICD-10-CM | POA: Diagnosis not present

## 2024-01-25 DIAGNOSIS — D638 Anemia in other chronic diseases classified elsewhere: Secondary | ICD-10-CM | POA: Diagnosis not present

## 2024-01-25 DIAGNOSIS — I4891 Unspecified atrial fibrillation: Secondary | ICD-10-CM | POA: Diagnosis not present

## 2024-01-25 LAB — CBC
HCT: 29 % — ABNORMAL LOW (ref 39.0–52.0)
Hemoglobin: 9.5 g/dL — ABNORMAL LOW (ref 13.0–17.0)
MCH: 28.3 pg (ref 26.0–34.0)
MCHC: 32.8 g/dL (ref 30.0–36.0)
MCV: 86.3 fL (ref 80.0–100.0)
Platelets: 169 K/uL (ref 150–400)
RBC: 3.36 MIL/uL — ABNORMAL LOW (ref 4.22–5.81)
RDW: 21.2 % — ABNORMAL HIGH (ref 11.5–15.5)
WBC: 6.1 K/uL (ref 4.0–10.5)
nRBC: 0 % (ref 0.0–0.2)

## 2024-01-25 MED ORDER — CEFDINIR 300 MG PO CAPS
300.0000 mg | ORAL_CAPSULE | Freq: Two times a day (BID) | ORAL | 0 refills | Status: DC
  Filled 2024-01-25: qty 8, 4d supply, fill #0

## 2024-01-25 MED ORDER — MECLIZINE HCL 25 MG PO TABS
25.0000 mg | ORAL_TABLET | Freq: Three times a day (TID) | ORAL | 0 refills | Status: AC | PRN
  Filled 2024-01-25: qty 30, 10d supply, fill #0

## 2024-01-25 MED ORDER — FOLIC ACID 1 MG PO TABS
1.0000 mg | ORAL_TABLET | Freq: Every day | ORAL | 0 refills | Status: AC
  Filled 2024-01-25: qty 30, 30d supply, fill #0

## 2024-01-25 MED ORDER — AZITHROMYCIN 500 MG PO TABS
ORAL_TABLET | ORAL | 0 refills | Status: DC
  Filled 2024-01-25: qty 2, 2d supply, fill #0

## 2024-01-25 NOTE — TOC Transition Note (Addendum)
 Transition of Care [facility]) - Discharge Note   Patient Details  Name: Michael [name] MRN: [id number] Date of Birth: 1932-08-19  [facility] CM/SW Contact:  Tawni CHRISTELLA Eva, LCSW Phone Number: 01/25/2024, 1:20 PM   Clinical Narrative:     CSW spoke with the pts daughter, [name], to discuss recommendations for home health services. The pts daughter reports that the pt is an independent living resident at [facility]. She is requesting home health services through [facility]. CSW explained that home health orders will be faxed to [facility], and they will follow up with her. She is also requesting a rolling walker and a bedside commode. [facility] walk[facility]  [facility] to be delivered to the pts home after discharge.  Per MD pt able to ambulate in hallway does not qualify for bedside commode. The pts daughter will provide transportation upon discharge. No further ICM needs at this time. ICM signing off.     Final next level of care: Home w Home Health Services Barriers to Discharge: Barriers Resolved   Patient Goals and CMS Choice Patient states their goals for this hospitalization and ongoing recovery are:: retrun home with home health   Choice offered to / list presented to : Adult Children      Discharge Placement                  Name of family member notified: Hitchcock,[name]  Daughter, Emergency Contact  [phone] (Mobile) Patient and family notified of of transfer: 01/25/24  Discharge Plan and Services Additional resources added to the After Visit Summary for                                       Social Drivers of Health (SDOH) Interventions SDOH Screenings   Food Insecurity: Unknown (01/24/2024)  Housing: Unknown (01/24/2024)  Transportation Needs: Unknown (01/24/2024)  Utilities: Not At Risk (01/24/2024)  Alcohol Screen: Low Risk (06/22/2023)  Depression (PHQ2-9): Low Risk (09/20/2023)  Financial Resource Strain: Low  Risk (06/22/2023)  Physical Activity: Sufficiently Active (06/22/2023)  Social Connections: Unknown (01/24/2024)  Stress: No Stress Concern Present (06/22/2023)  Tobacco Use: Medium Risk (01/01/2024)  Health Literacy: Adequate Health Literacy (06/22/2023)     Readmission Risk Interventions     No data to display

## 2024-01-25 NOTE — Discharge Summary (Signed)
 Physician Discharge Summary  Michael [name] FMW:[id number] DOB: 02-17-1932 DOA: 01/22/2024  PCP: [name], MD  Admit date: 01/22/2024 Discharge date: 01/25/2024 Discharging to: home Recommendations for Outpatient Follow-up:  F/u on low EF and hemoccult + stool  Repeat CXR in 3 wks F/u on low albumin level  Consults:  GI    Discharge Diagnoses:   Principal Problem:   CAP (community acquired pneumonia) Active Problems:   PAF (paroxysmal atrial fibrillation) (HCC)   Hypertension   Crohn's disease (HCC)   B12 deficiency   Glaucoma   Vitamin D  deficiency   Mild cognitive impairment   Aortic atherosclerosis   Vestibular disequilibrium   Seizure-like activity (HCC)   Dizziness     Brief hospital course: This is a 89 year old male with paroxysmal atrial fibrillation, history of DVT, and active Crohn's disease, B12 deficiency who presented to the hospital for feeling dizzy upon waking which was worse with movement.  He tried to use his walker but was unable to ambulate.  In the ED he was nauseated and had vomiting. Chest x-ray revealed a hazy opacity in the left lung base consistent with atelectasis versus infection.   Subjective:  Feels great today. Runny nose has just about resolved. Minimal cough. No dizziness   Assessment and Plan: Principal Problem:   Dizziness- vertigo?  - Based on my conversation with the patient's daughter, this resolved quickly after being given meclizine  - May have been vertigo related to recent infection-has resolved completely   Active Problems: Pneumonia- CAP - Possible left lower lobe pneumonia on CXR - Patient has had respiratory symptoms for about 1 wk and therefore it was decided to treat as a PNA - treated with ceftriaxone  and azithromycin - transition to Cefdinir  for 4 more days and complete Azithromycin  for 2 more days - symptoms improved  Dehydration - treated normal saline at 50 cc an hour   Hemoccult positive stool - History  of inactive Crohn's disease but also has had esophageal dilatation - Have consulted GI-they would like to hold off on luminal eval and request that he follow-up with [name] after discharge - Of note patient had an attempt at a sigmoidoscopy on 11/5 however prep was poor - Continue daily Protonix    Normocytic anemia - Baseline hemoglobin is about 10 - Hemoglobin is now about 8- 9 - Anemia panel reveals iron  saturation of 16 with a low TIBC and a ferritin of 239 (which may be an acute phase reactant) - Folate level is 5.4 - Given IV iron  today and started daily folic acid    Mild troponin elevation -  2D echo- EF 45-50% and grade 1 diastolic dysfunction - He should follow up with cardiology - will avoid ACE or diuretic as he is quite frail and prone to getting dehydrated     PAF (paroxysmal atrial fibrillation) -continue Eliquis  and metoprolol       Mild cognitive impairment - History obtained from daughter and caregiver who is at bedside   History of seizures - Continue Keppra    Glaucoma - Continue Alphagan  and Cosopt    Hypoalbuminemia - Albumin level 2.6          Discharge Instructions  Discharge Instructions     Amb Referral to Intravenous Iron  Therapy   Complete by: As directed    You have been referred to [facility] Infusion team for IV Iron  Infusions. The infusion pharmacy team will reach out to you with appointment information.    Primary Diagnosis Code for IV Iron : D50.9 -  Iron  deficiency Anemia   Secondary diagnosis code for IV iron : K50-K50.919 - Crohns Disease      Allergies as of 01/25/2024   No Known Allergies      Medication List     TAKE these medications    acetaminophen  500 MG tablet Commonly known as: TYLENOL  Take 500-1,000 mg by mouth every 8 (eight) hours as needed for mild pain or headache.   apixaban  5 MG Tabs tablet Commonly known as: Eliquis  Take 1 tablet (5 mg total) by mouth 2 (two) times daily.   augmented betamethasone  dipropionate 0.05 % cream Commonly known as: DIPROLENE-AF Apply 1 application  topically 2 (two) times daily as needed (Grover's disease- legs).   azithromycin  500 MG tablet Commonly known as: ZITHROMAX  Take on 1/16 and again on 1/17 and then stop Start taking on: January 26, 2024   brimonidine  0.2 % ophthalmic solution Commonly known as: ALPHAGAN  Place 1 drop into the left eye in the morning and at bedtime.   cefdinir  300 MG capsule Commonly known as: OMNICEF  Take 1 capsule (300 mg total) by mouth 2 (two) times daily. Start taking on: January 26, 2024   cyanocobalamin  1000 MCG/ML injection Commonly known as: VITAMIN B12 INJECT 1ML INTO THE MUSCLE ONCE MONTHLY   dorzolamide -timolol  2-0.5 % ophthalmic solution Commonly known as: COSOPT  Place 1 drop into the left eye in the morning and at bedtime.   folic acid  1 MG tablet Commonly known as: FOLVITE  Take 1 tablet (1 mg total) by mouth daily. Start taking on: January 26, 2024   latanoprost  0.005 % ophthalmic solution Commonly known as: XALATAN  Place 1 drop into the left eye at bedtime.   levETIRAcetam  750 MG tablet Commonly known as: KEPPRA  Take 1 tablet (750 mg total) by mouth 2 (two) times daily.   meclizine  25 MG tablet Commonly known as: ANTIVERT  Take 1 tablet (25 mg total) by mouth 3 (three) times daily as needed for dizziness.   metoprolol  succinate 25 MG 24 hr tablet Commonly known as: TOPROL -XL Take 0.5 tablets (12.5 mg total) by mouth daily.   Netarsudil  Dimesylate 0.02 % Soln Place 1 drop into the left eye at bedtime. Rhopressa    pantoprazole  40 MG tablet Commonly known as: PROTONIX  Take 1 tablet (40 mg total) by mouth daily before breakfast. What changed:  when to take this reasons to take this   polyethylene glycol 17 g packet Commonly known as: MIRALAX  / GLYCOLAX  Take 17 g by mouth daily.   sertraline  25 MG tablet Commonly known as: ZOLOFT  Take 1 tablet (25 mg total) by mouth daily.   Tums 500  MG chewable tablet Generic drug: calcium  carbonate Chew 1 tablet by mouth 3 (three) times daily as needed for indigestion or heartburn.   vitamin C  1000 MG tablet Take 1,000 mg by mouth daily with breakfast.            The results of significant diagnostics from this hospitalization (including imaging, microbiology, ancillary and laboratory) are listed below for reference.    ECHOCARDIOGRAM COMPLETE Result Date: 01/24/2024    ECHOCARDIOGRAM REPORT   Patient Name:   Michael [name] Date of Exam: 01/24/2024 Medical Rec #:  [id number]        Height:       71.0 in Accession #:    [id number]       Weight:       159.8 lb Date of Birth:  09-25-1932         BSA:  1.917 m Patient Age:    89 years         BP:           115/63 mmHg Patient Gender: M                HR:           77 bpm. Exam Location:  Inpatient Procedure: 2D Echo, Cardiac Doppler and Color Doppler (Both Spectral and Color            Flow Doppler were utilized during procedure).                             MODIFIED REPORT: This report was modified by Redell Shallow MD on 01/24/2024 due to change.  Indications:     R07.9* Chest pain, unspecified. Elevated troponin  History:         Patient has prior history of Echocardiogram examinations, most                  recent 09/23/2020. Abnormal ECG, Arrythmias:Atrial Fibrillation,                  Signs/Symptoms:Altered Mental Status and                  Dizziness/Lightheadedness; Risk Factors:Hypertension.  Sonographer:     Ellouise Mose RDCS Referring Phys:  [id number] ATLAS Diagnosing Phys: Redell Shallow MD IMPRESSIONS  1. Left ventricular ejection fraction, by estimation, is 45 to 50%. Left ventricular ejection fraction by 2D MOD biplane is 47.7 %. The left ventricle has mildly decreased function. The left ventricle demonstrates global hypokinesis. There is mild concentric left ventricular hypertrophy. Left ventricular diastolic parameters are consistent with Grade I diastolic dysfunction  (impaired relaxation).  2. Right ventricular systolic function is normal. The right ventricular size is mildly enlarged. Tricuspid regurgitation signal is inadequate for assessing PA pressure. The estimated right ventricular systolic pressure is 39.4 mmHg.  3. The mitral valve is normal in structure. Trivial mitral valve regurgitation. No evidence of mitral stenosis.  4. The aortic valve is tricuspid. Aortic valve regurgitation is trivial. No aortic stenosis is present.  5. The inferior vena cava is normal in size with greater than 50% respiratory variability, suggesting right atrial pressure of 3 mmHg. FINDINGS  Left Ventricle: Left ventricular ejection fraction, by estimation, is 45 to 50%. Left ventricular ejection fraction by 2D MOD biplane is 47.7 %. The left ventricle has mildly decreased function. The left ventricle demonstrates global hypokinesis. The left ventricular internal cavity size was normal in size. There is mild concentric left ventricular hypertrophy. Left ventricular diastolic parameters are consistent with Grade I diastolic dysfunction (impaired relaxation). Right Ventricle: The right ventricular size is mildly enlarged. Right ventricular systolic function is normal. Tricuspid regurgitation signal is inadequate for assessing PA pressure. The tricuspid regurgitant velocity is 2.80 m/s, and with an assumed right atrial pressure of 8 mmHg, the estimated right ventricular systolic pressure is 39.4 mmHg. Left Atrium: Left atrial size was normal in size. Right Atrium: Right atrial size was normal in size. Pericardium: There is no evidence of pericardial effusion. Mitral Valve: The mitral valve is normal in structure. Trivial mitral valve regurgitation. No evidence of mitral valve stenosis. Tricuspid Valve: The tricuspid valve is normal in structure. Tricuspid valve regurgitation is trivial. No evidence of tricuspid stenosis. Aortic Valve: The aortic valve is tricuspid. Aortic valve regurgitation is  trivial. No aortic stenosis is present.  Pulmonic Valve: The pulmonic valve was normal in structure. Pulmonic valve regurgitation is not visualized. No evidence of pulmonic stenosis. Aorta: The aortic root is normal in size and structure. Venous: The inferior vena cava is normal in size with greater than 50% respiratory variability, suggesting right atrial pressure of 3 mmHg. IAS/Shunts: No atrial level shunt detected by color flow Doppler.  LEFT VENTRICLE PLAX 2D                        Biplane EF (MOD) LVIDd:         4.20 cm         LV Biplane EF:   Left LVIDs:         2.80 cm                          ventricular LV PW:         0.90 cm                          ejection LV IVS:        1.00 cm                          fraction by LVOT diam:     2.40 cm                          2D MOD LV SV:         79                               biplane is LV SV Index:   41                               47.7 %. LVOT Area:     4.52 cm                                Diastology                                LV e' medial:    5.87 cm/s LV Volumes (MOD)               LV E/e' medial:  8.7 LV vol d, MOD    89.2 ml       LV e' lateral:   10.20 cm/s A2C:                           LV E/e' lateral: 5.0 LV vol d, MOD    94.1 ml A4C: LV vol s, MOD    45.7 ml A2C: LV vol s, MOD    50.9 ml A4C: LV SV MOD A2C:   43.5 ml LV SV MOD A4C:   94.1 ml LV SV MOD BP:    44.8 ml RIGHT VENTRICLE             IVC RV S prime:     11.30 cm/s  IVC diam: 1.60 cm TAPSE (M-mode): 1.6 cm  PULMONARY VEINS                             Diastolic Velocity: 35.60 cm/s                             S/D Velocity:       1.20                             Systolic Velocity:  43.70 cm/s LEFT ATRIUM           Index        RIGHT ATRIUM           Index LA Vol (A2C): 24.7 ml 12.89 ml/m  RA Area:     13.60 cm LA Vol (A4C): 38.9 ml 20.29 ml/m  RA Volume:   30.80 ml  16.07 ml/m  AORTIC VALVE LVOT Vmax:   87.00 cm/s LVOT Vmean:  57.800 cm/s LVOT VTI:    0.174  m  AORTA Ao Root diam: 3.80 cm Ao Asc diam:  3.30 cm MITRAL VALVE               TRICUSPID VALVE MV Area (PHT): 2.99 cm    TR Peak grad:   31.4 mmHg MV Decel Time: 254 msec    TR Vmax:        280.00 cm/s MV E velocity: 51.00 cm/s MV A velocity: 66.00 cm/s  SHUNTS MV E/A ratio:  0.77        Systemic VTI:  0.17 m                            Systemic Diam: 2.40 cm Redell Shallow MD Electronically signed by Redell Shallow MD Signature Date/Time: 01/24/2024/3:42:04 PM    Final (Updated)    MR BRAIN WO CONTRAST Result Date: 01/22/2024 EXAM: MRI Brain Without Contrast 01/22/2024 12:45:57 PM TECHNIQUE: Multiplanar multisequence MRI of the head/brain was performed without the administration of intravenous contrast. COMPARISON: None available. CLINICAL HISTORY: Neuro deficit, acute, stroke suspected FINDINGS: BRAIN AND VENTRICLES: No acute infarct. No acute intracranial hemorrhage. Chronic microhemorrhage in the left thalamus. Remote lacunar infarcts in the left basal ganglia and left thalamus and mild for age chronic microvascular ischemic change. No mass. No midline shift. No hydrocephalus. The sella is unremarkable. Normal flow voids. ORBITS: No significant abnormality. SINUSES AND MASTOIDS: No significant abnormality. BONES AND SOFT TISSUES: Normal marrow signal. No significant soft tissue abnormality. IMPRESSION: 1. No acute abnormality. Electronically signed by: Gilmore Molt MD MD 01/22/2024 02:34 PM EST RP Workstation: [id number]   DG Chest Portable 1 View Result Date: 01/22/2024 EXAM: 1 VIEW(S) XRAY OF THE CHEST 01/22/2024 11:28:00 AM COMPARISON: 09/20/2023 CLINICAL HISTORY: cough FINDINGS: LUNGS AND PLEURA: Low lung volumes. There are diffuse increased reticular interstitial opacities throughout both lungs compatible with chronic fibrotic interstitial lung disease. Hazy opacity at left lung base, possibly atelectasis or infection. No pleural effusion. No pneumothorax. HEART AND MEDIASTINUM: No acute  abnormality of the cardiac and mediastinal silhouettes. BONES AND SOFT TISSUES: No acute osseous abnormality. IMPRESSION: 1. Hazy opacity at the left lung base, possibly atelectasis or infection. 2. Chronic fibrotic interstitial lung disease. Electronically signed by: Waddell Calk MD MD 01/22/2024 01:08 PM EST RP Workstation: [id number]   CT ANGIO HEAD NECK W WO CM Result Date: 01/22/2024 EXAM:  CTA Head and Neck with Intravenous Contrast. CT Head without Contrast. CLINICAL HISTORY: 89 year old male with acute neurological deficit, stroke suspected. Dizziness upon waking at 0400 hours. Atrial fibrillation on blood thinners. TECHNIQUE: Axial CTA images of the head and neck performed with intravenous contrast. MIP reconstructed images were created and reviewed. Axial computed tomography images of the head/brain performed without intravenous contrast. Note: Per PQRS, the description of internal carotid artery percent stenosis, including 0 percent or normal exam, is based on [employer] American Symptomatic Carotid Endarterectomy Trial (NASCET) criteria. Dose reduction technique was used including one or more of the following: automated exposure control, adjustment of mA and kV according to patient size, and/or iterative reconstruction. CONTRAST: With and without; 75 mL iohexol  (OMNIPAQUE ) 350 MG/ML. COMPARISON: Brain MRI 10/28/2023, Head CT 10/28/2023, and CTA Head and Neck 10/28/2023. FINDINGS: CT HEAD: BRAIN: Brain volume is stable and normal for age. Mild to moderate for age periventricular white matter hypodensity is stable. No acute intraparenchymal hemorrhage. No mass lesion. No CT evidence for acute territorial infarct. No midline shift or extra-axial collection. VENTRICLES: No hydrocephalus. ORBITS: No gaze deviation. SINUSES AND MASTOIDS: Chronic right maxillary sinus disease is stable. The paranasal sinuses are otherwise clear. Tympanic cavities and mastoids remain well aerated. CTA NECK: Mildly tortuous 3  vessel aortic arch with mild for age atherosclerosis. Calcified atherosclerosis at the right subclavian artery origin without stenosis. CAROTID ARTERIES: Tortuous cervical carotid arteries with mild for age atherosclerosis and no stenosis. No dissection or occlusion. Tortuous cervical carotid arteries with mild for age atherosclerosis and no stenosis. No dissection or occlusion. Left ICA siphon mild to moderate calcified atherosclerosis without stenosis. Right ICA siphon minimal calcified atherosclerosis and no stenosis. Normal left posterior communicating artery origin. Anterior communicating and right posterior communicating arteries are diminutive or absent. VERTEBRAL ARTERIES: Normal vertebral artery origins. No significant vertebral artery atherosclerosis or stenosis. Codominant vertebral arteries. Normal PICA origins. No dissection or occlusion. CTA HEAD: ANTERIOR CEREBRAL ARTERIES: Normal ACA origins. No significant stenosis. No occlusion. No aneurysm. MIDDLE CEREBRAL ARTERIES: Normal MCA origins. No significant stenosis. No occlusion. No aneurysm. POSTERIOR CEREBRAL ARTERIES: Normal PCA origins. No significant stenosis. No occlusion. No aneurysm. BASILAR ARTERY: No significant stenosis. No occlusion. No aneurysm. OTHER: Major dural venous sinuses are enhancing and patent. Right transverse and sigmoid sinuses are dominant, normal variant. SOFT TISSUES: Nonvascular neck soft tissue spaces no acute finding. BONES: Severe right TMJ degeneration. Cervical spine degeneration with associated degenerative ankylosis at C3-C4. No acute osseous abnormality. 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 Acute respiratory infection difficult to exclude.  Visible central pulmonary arteries are patent. No superior mediastinal lymphadenopathy. IMPRESSION: 1. Chronic lung disease with progressive upper lung opacities, new layering pleural effusions since last year. Respiratory infection not excluded. 2. Chronic arterial tortuosity with mild panarteritis atherosclerosis. No stenosis, aneurysm, or dissection in the head or neck arteries. 3. No acute intracranial abnormality. Electronically signed by: Helayne Hurst MD MD 01/22/2024 11:41 AM EST RP Workstation: [id number]   Labs:   Basic Metabolic Panel: Recent Labs  Lab 01/22/24 1025 01/22/24 1037 01/23/24 0535 01/24/24 1519  NA 137 139 135 131*  K 3.6 3.6 3.8 3.4*  CL 105 103 104 101  CO2 24  --  23 20*  GLUCOSE 122* 116* 90 83  BUN 14  15 13 12   CREATININE 0.93 0.90 0.79 0.93  CALCIUM  8.1*  --  8.0* 7.6*     CBC: Recent Labs  Lab 01/22/24 1025 01/22/24 1037 01/23/24 0535 01/24/24 1519 01/25/24 0925  WBC 6.1  --  5.4 6.5 6.1  NEUTROABS 3.0  --   --   --   --   HGB 9.0* 9.2* 8.1* 8.7* 9.5*  HCT 26.6* 27.0* 24.1* 26.0* 29.0*  MCV 85.8  --  85.5 86.4 86.3  PLT 174  --  164 172 169         SIGNED:   True Atlas, MD  [facility] 01/25/2024, 12:55 PM Time taking on discharge: 50 minutes

## 2024-01-25 NOTE — Discharge Instructions (Addendum)
 SABRA

## 2024-01-25 NOTE — Progress Notes (Signed)
 "    [facility] Progress Note  CC:  Positive fecal occult, acute on chronic anemia   Subjective: Feels good.  Ready for discharge.  Daughter at bedside.  No sign of bleeding.  Ate a good breakfast.  Objective:  Vital signs in last 24 hours: Temp:  [97.5 F (36.4 C)-97.8 F (36.6 C)] 97.8 F (36.6 C) (01/15 0534) Pulse Rate:  [79-95] 83 (01/15 0534) Resp:  [14-18] 18 (01/15 0534) BP: (115-128)/(51-72) 128/69 (01/15 0534) SpO2:  [93 %-100 %] 93 % (01/15 0534) Last BM Date : 01/24/24 General:  Alert, Well-developed, in NAD Heart:  Regular rate and rhythm; no murmurs Pulm:  CTAB.  No W/R/R. Abdomen:  Soft, non-distended.  BS present.  Non-tender. Extremities:  Without edema. Neurologic:  Alert and oriented x 4;  grossly normal neurologically. Psych:  Alert and cooperative. Normal mood and affect.  Intake/Output from previous day: 01/14 0701 - 01/15 0700 In: 612.9 [P.O.:240; I.V.:162.9; IV Piggyback:210] Out: 1225 [Urine:1225] Intake/Output this shift: Total I/O In: -  Out: 200 [Urine:200]  Lab Results: Recent Labs    01/22/24 1025 01/22/24 1037 01/23/24 0535 01/24/24 1519  WBC 6.1  --  5.4 6.5  HGB 9.0* 9.2* 8.1* 8.7*  HCT 26.6* 27.0* 24.1* 26.0*  PLT 174  --  164 172   BMET Recent Labs    01/22/24 1025 01/22/24 1037 01/23/24 0535 01/24/24 1519  NA 137 139 135 131*  K 3.6 3.6 3.8 3.4*  CL 105 103 104 101  CO2 24  --  23 20*  GLUCOSE 122* 116* 90 83  BUN 14 15 13 12   CREATININE 0.93 0.90 0.79 0.93  CALCIUM  8.1*  --  8.0* 7.6*   LFT Recent Labs    01/24/24 1519  PROT 5.5*  ALBUMIN 2.7*  AST 19  ALT 9  ALKPHOS 69  BILITOT 0.3   PT/INR Recent Labs    01/22/24 1025  LABPROT 18.9*  INR 1.5*   ECHOCARDIOGRAM COMPLETE Result Date: 01/24/2024    ECHOCARDIOGRAM REPORT   Patient Name:   Michael [name] Date of Exam: 01/24/2024 Medical Rec #:  [id number]        Height:       71.0 in Accession #:    [id number]       Weight:       159.8  lb Date of Birth:  1932/12/22         BSA:          1.917 m Patient Age:    89 years         BP:           115/63 mmHg Patient Gender: M                HR:           77 bpm. Exam Location:  Inpatient Procedure: 2D Echo, Cardiac Doppler and Color Doppler (Both Spectral and Color            Flow Doppler were utilized during procedure).                             MODIFIED REPORT: This report was modified by Redell Shallow MD on 01/24/2024 due to change.  Indications:     R07.9* Chest pain, unspecified. Elevated troponin  History:         Patient has prior history of Echocardiogram examinations, most  recent 09/23/2020. Abnormal ECG, Arrythmias:Atrial Fibrillation,                  Signs/Symptoms:Altered Mental Status and                  Dizziness/Lightheadedness; Risk Factors:Hypertension.  Sonographer:     Ellouise Mose RDCS Referring Phys:  [id number] ATLAS Diagnosing Phys: Redell Shallow MD IMPRESSIONS  1. Left ventricular ejection fraction, by estimation, is 45 to 50%. Left ventricular ejection fraction by 2D MOD biplane is 47.7 %. The left ventricle has mildly decreased function. The left ventricle demonstrates global hypokinesis. There is mild concentric left ventricular hypertrophy. Left ventricular diastolic parameters are consistent with Grade I diastolic dysfunction (impaired relaxation).  2. Right ventricular systolic function is normal. The right ventricular size is mildly enlarged. Tricuspid regurgitation signal is inadequate for assessing PA pressure. The estimated right ventricular systolic pressure is 39.4 mmHg.  3. The mitral valve is normal in structure. Trivial mitral valve regurgitation. No evidence of mitral stenosis.  4. The aortic valve is tricuspid. Aortic valve regurgitation is trivial. No aortic stenosis is present.  5. The inferior vena cava is normal in size with greater than 50% respiratory variability, suggesting right atrial pressure of 3 mmHg. FINDINGS  Left Ventricle: Left  ventricular ejection fraction, by estimation, is 45 to 50%. Left ventricular ejection fraction by 2D MOD biplane is 47.7 %. The left ventricle has mildly decreased function. The left ventricle demonstrates global hypokinesis. The left ventricular internal cavity size was normal in size. There is mild concentric left ventricular hypertrophy. Left ventricular diastolic parameters are consistent with Grade I diastolic dysfunction (impaired relaxation). Right Ventricle: The right ventricular size is mildly enlarged. Right ventricular systolic function is normal. Tricuspid regurgitation signal is inadequate for assessing PA pressure. The tricuspid regurgitant velocity is 2.80 m/s, and with an assumed right atrial pressure of 8 mmHg, the estimated right ventricular systolic pressure is 39.4 mmHg. Left Atrium: Left atrial size was normal in size. Right Atrium: Right atrial size was normal in size. Pericardium: There is no evidence of pericardial effusion. Mitral Valve: The mitral valve is normal in structure. Trivial mitral valve regurgitation. No evidence of mitral valve stenosis. Tricuspid Valve: The tricuspid valve is normal in structure. Tricuspid valve regurgitation is trivial. No evidence of tricuspid stenosis. Aortic Valve: The aortic valve is tricuspid. Aortic valve regurgitation is trivial. No aortic stenosis is present. Pulmonic Valve: The pulmonic valve was normal in structure. Pulmonic valve regurgitation is not visualized. No evidence of pulmonic stenosis. Aorta: The aortic root is normal in size and structure. Venous: The inferior vena cava is normal in size with greater than 50% respiratory variability, suggesting right atrial pressure of 3 mmHg. IAS/Shunts: No atrial level shunt detected by color flow Doppler.  LEFT VENTRICLE PLAX 2D                        Biplane EF (MOD) LVIDd:         4.20 cm         LV Biplane EF:   Left LVIDs:         2.80 cm                          ventricular LV PW:         0.90 cm  ejection LV IVS:        1.00 cm                          fraction by LVOT diam:     2.40 cm                          2D MOD LV SV:         79                               biplane is LV SV Index:   41                               47.7 %. LVOT Area:     4.52 cm                                Diastology                                LV e' medial:    5.87 cm/s LV Volumes (MOD)               LV E/e' medial:  8.7 LV vol d, MOD    89.2 ml       LV e' lateral:   10.20 cm/s A2C:                           LV E/e' lateral: 5.0 LV vol d, MOD    94.1 ml A4C: LV vol s, MOD    45.7 ml A2C: LV vol s, MOD    50.9 ml A4C: LV SV MOD A2C:   43.5 ml LV SV MOD A4C:   94.1 ml LV SV MOD BP:    44.8 ml RIGHT VENTRICLE             IVC RV S prime:     11.30 cm/s  IVC diam: 1.60 cm TAPSE (M-mode): 1.6 cm                             PULMONARY VEINS                             Diastolic Velocity: 35.60 cm/s                             S/D Velocity:       1.20                             Systolic Velocity:  43.70 cm/s LEFT ATRIUM           Index        RIGHT ATRIUM           Index LA Vol (A2C): 24.7 ml 12.89 ml/m  RA Area:     13.60 cm LA Vol (A4C): 38.9 ml 20.29 ml/m  RA Volume:   30.80 ml  16.07 ml/m  AORTIC VALVE LVOT Vmax:  87.00 cm/s LVOT Vmean:  57.800 cm/s LVOT VTI:    0.174 m  AORTA Ao Root diam: 3.80 cm Ao Asc diam:  3.30 cm MITRAL VALVE               TRICUSPID VALVE MV Area (PHT): 2.99 cm    TR Peak grad:   31.4 mmHg MV Decel Time: 254 msec    TR Vmax:        280.00 cm/s MV E velocity: 51.00 cm/s MV A velocity: 66.00 cm/s  SHUNTS MV E/A ratio:  0.77        Systemic VTI:  0.17 m                            Systemic Diam: 2.40 cm Redell Shallow MD Electronically signed by Redell Shallow MD Signature Date/Time: 01/24/2024/3:42:04 PM    Final (Updated)    Assessment / Plan: Acute on chronic anemia Heme positive stool Multiple colonoscopies in 2021 with numerous tubular adenomas and last colonoscopy in  2022 with with polyploid lesion with biopsies showing fragments of polypoid colonic mucosa with low-grade dysplasia with a tubular architecture and an inflammatory polyp.  He did have a flexible sigmoidoscopy 11/2023 for disimpaction and EGD 11/2023 which was overall unrevealing other than a gastric polyp. Positive fecal occult No overt bleeding Normal BUN/CR Hgb 9.5 g today, up from 8.7 g yesterday.   Extensive discussion with daughter and patient with Dr. Albertus resulted in decision to hold off on colonoscopy for now due to lack of overt bleeding, active pneumonia, need for extensive 2-day bowel prep.  As stated may have some chronic GI blood loss from his Crohn's disease.  Malignancy felt very unlikely.  Crohn's disease Colonoscopy 03/2020 showed Crohn's disease with mild active ileitis, patent end-to-end ileocolonic anastomosis with nonspecific inflammatory changes without granulomas or dysplasia.  CT chest abdomen pelvis with contrast 11/2023 with active Crohn's disease in colon remnant in distal ileum.  Uses intermittent budesonide  for flares.   A-fib On Eliquis .  -Okay for discharge from GI standpoint.    LOS: 1 day   Michael [name]. Michael [name]  01/25/2024, 9:20 AM    "

## 2024-01-25 NOTE — Plan of Care (Signed)

## 2024-01-25 NOTE — Progress Notes (Signed)
 Discharge instructions reviewed with patient and his family- verbalized understanding. All questions answered. All belongings accounted for. Patient to follow up with MD in  1-2 weeks. PIV removed. Assisted via WC to private vehicle.

## 2024-01-25 NOTE — Progress Notes (Signed)
 Discharge medications delivered to patient at the bedside in a secure bag.

## 2024-01-25 NOTE — Progress Notes (Signed)
 Physical Therapy Treatment Patient Details Name: Michael [name] MRN: [id number] DOB: 06-02-1932 Today's Date: 01/25/2024   History of Present Illness Patient is a 89 yo male presenting to the ED with dizziness on 01/22/24.  Positive for vertigo with nystagmus in the ED and unable to get OOB.  Addtionally, pt with recent URI and founcd to have CAP. MRI and CTA clear.  PMH: Crohn's disease, a fib, aortic atherosclerosis, inguinal hernia, anemia, glaucoma, osteopenia, SBO, TIA, HTN, HLD    PT Comments  Pt sitting in recliner with family in room.  Pt eagerly awaiting mobility and hopeful for d/c today.  Pt reports feeling better and denies any dizziness today.  Pt ambulated in hallway with RW and again denies dizziness.  Family member reports pt is going to d/c home with her.      If plan is discharge home, recommend the following: A little help with walking and/or transfers;A little help with bathing/dressing/bathroom;Assistance with cooking/housework;Help with stairs or ramp for entrance   Can travel by private vehicle        Equipment Recommendations  None recommended by PT    Recommendations for Other Services       Precautions / Restrictions Precautions Precautions: Fall Recall of Precautions/Restrictions: Intact     Mobility  Bed Mobility               General bed mobility comments: pt in recliner upon arrival    Transfers Overall transfer level: Needs assistance Equipment used: None Transfers: Sit to/from Stand Sit to Stand: Contact guard assist           General transfer comment: CGA for safety as pt tends to perform transfer without RW near    Ambulation/Gait Ambulation/Gait assistance: Contact guard assist Gait Distance (Feet): 160 Feet Assistive device: Rolling walker (2 wheels) Gait Pattern/deviations: Step-through pattern, Decreased stride length, Trunk flexed Gait velocity: decreased     General Gait Details: CGA for safety, but no LOB and denied  dizziness even with (slow) head turns   Comptroller Bed    Modified Rankin (Stroke Patients Only)       Balance                                            Communication Communication Communication: Impaired Factors Affecting Communication: Hearing impaired  Cognition Arousal: Alert Behavior During Therapy: WFL for tasks assessed/performed   PT - Cognitive impairments: No apparent impairments                         Following commands: Intact      Cueing    Exercises      General Comments        Pertinent Vitals/Pain Pain Assessment Pain Assessment: No/denies pain    Home Living                          Prior Function            PT Goals (current goals can now be found in the care plan section) Progress towards PT goals: Progressing toward goals    Frequency    Min 2X/week      PT Plan  Co-evaluation              AM-PAC PT 6 Clicks Mobility   Outcome Measure  Help needed turning from your back to your side while in a flat bed without using bedrails?: A Little Help needed moving from lying on your back to sitting on the side of a flat bed without using bedrails?: A Little Help needed moving to and from a bed to a chair (including a wheelchair)?: A Little Help needed standing up from a chair using your arms (e.g., wheelchair or bedside chair)?: A Little Help needed to walk in hospital room?: A Little Help needed climbing 3-5 steps with a railing? : A Little 6 Click Score: 18    End of Session Equipment Utilized During Treatment: Gait belt Activity Tolerance: Patient tolerated treatment well Patient left: in chair;with chair alarm set;with call bell/phone within reach;with family/visitor present Nurse Communication: Mobility status PT Visit Diagnosis: Other abnormalities of gait and mobility (R26.89)     Time: 8950-8940 PT Time Calculation  (min) (ACUTE ONLY): 10 min  Charges:    $Gait Training: 8-22 mins PT General Charges $$ ACUTE PT VISIT: 1 Visit                     Michael [name], DPT Physical Therapist Acute Rehabilitation Services Office: [phone]    Michael [name] 01/25/2024, 11:42 AM

## 2024-01-26 ENCOUNTER — Telehealth: Payer: Self-pay

## 2024-01-26 NOTE — Transitions of Care (Post Inpatient/ED Visit) (Signed)
 "  01/26/2024  Name: Michael [name] MRN: [id number] DOB: 1932/11/22  Today's TOC FU Call Status: Today's TOC FU Call Status:: Successful TOC FU Call Completed TOC FU Call Complete Date: 01/26/24  Patient's Name and Date of Birth confirmed. Name, DOB  Transition Care Management Follow-up Telephone Call Date of Discharge: 01/25/24 Discharge Facility: [facility]) Type of Discharge: Inpatient Admission Primary Inpatient Discharge Diagnosis:: convulsions How have you been since you were released from the hospital?: Better Any questions or concerns?: No  Items Reviewed: Did you receive and understand the discharge instructions provided?: Yes Medications obtained,verified, and reconciled?: Yes (Medications Reviewed) Any new allergies since your discharge?: No Dietary orders reviewed?: Yes Do you have support at home?: Yes People in Home [RPT]: friend(s)  Medications Reviewed Today: Medications Reviewed Today     Reviewed by Emmitt Pan, LPN (Licensed Practical Nurse) on 01/26/24 at 1022  Med List Status: <None>   Medication Order Taking? Sig Documenting Provider Last Dose Status Informant  acetaminophen  (TYLENOL ) 500 MG tablet [id number] Yes Take 500-1,000 mg by mouth every 8 (eight) hours as needed for mild pain or headache.  [provider]  Active Pharmacy Records, Self  apixaban  (ELIQUIS ) 5 MG TABS tablet [id number] Yes Take 1 tablet (5 mg total) by mouth 2 (two) times daily. Verlin Lonni BIRCH, MD  Active Pharmacy Records, Self           Med Note [place], NATHANEL LOISE Kitchens Nov 13, 2023  3:41 PM)    Ascorbic Acid  (VITAMIN C ) 1000 MG tablet [id number] Yes Take 1,000 mg by mouth daily with breakfast.  [provider]  Active Pharmacy Records, Self  augmented betamethasone dipropionate (DIPROLENE-AF) 0.05 % cream [id number] Yes Apply 1 application  topically 2 (two) times daily as needed (Grover's disease- legs). [provider]  Active Pharmacy Records,  Self  azithromycin  (ZITHROMAX ) 500 MG tablet [id number] Yes Take on 1/16 and again on 1/17 and then stop Rizwan, Saima, MD  Active   brimonidine  (ALPHAGAN ) 0.2 % ophthalmic solution [id number] Yes Place 1 drop into the left eye in the morning and at bedtime. [provider]  Active Pharmacy Records, Self           Med Note EFRAIM ALFREIDA LITTIE Kitchens Jan 22, 2024  5:10 PM)    cefdinir  (OMNICEF ) 300 MG capsule [id number] Yes Take 1 capsule (300 mg total) by mouth 2 (two) times daily. Rizwan, Saima, MD  Active   cyanocobalamin  (VITAMIN B12) 1000 MCG/ML injection [id number] Yes INJECT INTO THE MUSCLE ONCE NAIDA Geofm Glade JINNY, MD  Active Pharmacy Records, Self           Med Note EFRAIM ALFREIDA LITTIE Kitchens Jan 22, 2024  5:32 PM) Patient stated he had this about 2-3 weeks ago  dorzolamide -timolol  (COSOPT ) 22.3-6.8 MG/ML ophthalmic solution [id number] Yes Place 1 drop into the left eye in the morning and at bedtime. [provider]  Active Pharmacy Records, Self           Med Note EFRAIM ALFREIDA LITTIE Kitchens Jan 22, 2024  5:10 PM)    folic acid  (FOLVITE ) 1 MG tablet [id number] Yes Take 1 tablet (1 mg total) by mouth daily. Rizwan, Saima, MD  Active   latanoprost  (XALATAN ) 0.005 % ophthalmic solution [id number] Yes Place 1 drop into the left eye at bedtime. [provider]  Active Pharmacy Records, Self           Med  Note (HAGOPIAN, TYELISHA L   Mon Jan 22, 2024  5:10 PM)    levETIRAcetam  (KEPPRA ) 750 MG tablet [id number] Yes Take 1 tablet (750 mg total) by mouth 2 (two) times daily. Gonfa, Taye T, MD  Active Pharmacy Records, Self  meclizine  (ANTIVERT ) 25 MG tablet [id number] Yes Take 1 tablet (25 mg total) by mouth 3 (three) times daily as needed for dizziness. Rizwan, Saima, MD  Active   metoprolol  succinate (TOPROL -XL) 25 MG 24 hr tablet [id number] Yes Take 0.5 tablets (12.5 mg total) by mouth daily. [name], MD  Active Pharmacy Records, Self  Netarsudil  Dimesylate 0.02  % SOLN [id number] Yes Place 1 drop into the left eye at bedtime. Rhopressa  [provider]  Active Pharmacy Records, Self           Med Note EFRAIM ALFREIDA LITTIE Pablo Jan 22, 2024  5:10 PM)    pantoprazole  (PROTONIX ) 40 MG tablet [id number] Yes Take 1 tablet (40 mg total) by mouth daily before breakfast.  Patient taking differently: Take 40 mg by mouth as needed.   Sebastian Toribio GAILS, MD  Active Pharmacy Records, Self  polyethylene glycol (MIRALAX  / GLYCOLAX ) 17 g packet [id number] Yes Take 17 g by mouth daily. Sebastian Toribio GAILS, MD  Active Pharmacy Records, Self  sertraline  (ZOLOFT ) 25 MG tablet [id number]  Take 1 tablet (25 mg total) by mouth daily.  Patient not taking: Reported on 01/26/2024   [name], MD  Active Pharmacy Records, Self  TUMS 500 MG chewable tablet [id number] Yes Chew 1 tablet by mouth 3 (three) times daily as needed for indigestion or heartburn. [provider]  Active Pharmacy Records, Self            Home Care and Equipment/Supplies: Were Home Health Services Ordered?: NA Any new equipment or medical supplies ordered?: NA  Functional Questionnaire: Do you need assistance with bathing/showering or dressing?: No Do you need assistance with meal preparation?: No Do you need assistance with eating?: No Do you have difficulty maintaining continence: No Do you need assistance with getting out of bed/getting out of a chair/moving?: No Do you have difficulty managing or taking your medications?: Yes  Follow up appointments reviewed: PCP Follow-up appointment confirmed?: Yes Date of PCP follow-up appointment?: 01/31/24 Follow-up Provider: [facility] Follow-up appointment confirmed?: NA Do you need transportation to your follow-up appointment?: No Do you understand care options if your condition(s) worsen?: Yes-patient verbalized understanding    SIGNATURE Julian Lemmings, LPN [facility] Nurse Health Advisor Direct Dial  [phone]  "

## 2024-01-28 ENCOUNTER — Encounter: Payer: Self-pay | Admitting: Internal Medicine

## 2024-01-28 NOTE — Progress Notes (Unsigned)
 "     Subjective:    Patient ID: Michael [name], male    DOB: August 07, 1932, 89 y.o.   MRN: [id number]     HPI Michael [name] is here for follow up from the hospital  Admitted 1/14 - 1/15  Recommendations for Outpatient Follow-up:  F/u on low EF and hemoccult + stool  Repeat CXR in 3 wks F/u on low albumin level  Presented with dizziness.  He had a URI a couple of weeks prior and still had a cough.  That morning he was dizzy when he sat up in bed.  His gait was unsteady.  He was not able to ambulate with his walker.  In the ED he was nauseous and vomited.  No fever, chest pain, edema, no abdominal pain.        WBC 6.1, hgb 9, trop 38->40, BNP 1587.  CXR hazy opacity at [facility].  Chronic fibrotic interstitial lung disease.  CTA head and neck w/o acute concern.  MRI brain w/o acute abnormality.  VSS   Dizziness ? Vertigo Resolved quickly after given meclizine  No further episodes  CAP -LLL Possible LLL on CXR Treated with ceftriaxone  and azithro - > cefdinir  x 4 more days, azitrho x 2 days Symptoms improved  Dehydration Treated with Normal saline  Hemoccult pos stool H/o inactive Crohn's dz, h/o esoph dilation GI consulted Held off on procedure - outpatient follow up Continue daily protonix   Normocytic anemia Baseline hgb 10 In hospital 8-9 Given IV iron , started on daily folic acid  GI to arrange outpatient IV infusion  Mild troponin elevation Echo EF 45-50% and grade 1 DD Needs f/u with cardiology  PAF Eliquis , metoprolol   Mild cognitive impairment  Hx of seizures Continue Keppra   Hypoalbuminemia Albumin 2.6  Medications and allergies reviewed with patient and updated if appropriate.  Medications Ordered Prior to Encounter[1]   Review of Systems     Objective:  There were no vitals filed for this visit. BP Readings from Last 3 Encounters:  01/25/24 128/69  01/01/24 126/70  11/17/23 (!) 151/82   Wt Readings from Last 3 Encounters:  01/22/24 159 lb 13.3 oz  (72.5 kg)  01/01/24 160 lb (72.6 kg)  11/13/23 154 lb 1.6 oz (69.9 kg)   There is no height or weight on file to calculate BMI.    Physical Exam     Lab Results  Component Value Date   WBC 6.1 01/25/2024   HGB 9.5 (L) 01/25/2024   HCT 29.0 (L) 01/25/2024   PLT 169 01/25/2024   GLUCOSE 83 01/24/2024   CHOL 82 02/21/2023   TRIG 92.0 02/21/2023   HDL 31.40 (L) 02/21/2023   LDLDIRECT 60.6 09/11/2019   LDLCALC 32 02/21/2023   ALT 9 01/24/2024   AST 19 01/24/2024   NA 131 (L) 01/24/2024   K 3.4 (L) 01/24/2024   CL 101 01/24/2024   CREATININE 0.93 01/24/2024   BUN 12 01/24/2024   CO2 20 (L) 01/24/2024   TSH 2.840 01/23/2024   INR 1.5 (H) 01/22/2024   HGBA1C 5.6 01/14/2021   ECHOCARDIOGRAM COMPLETE    ECHOCARDIOGRAM REPORT       Patient Name:   Michael [name] Date of Exam: 01/24/2024 Medical Rec #:  [id number]        Height:       71.0 in Accession #:    [id number]       Weight:       159.8 lb Date of Birth:  10/03/1932  BSA:          1.917 m Patient Age:    89 years         BP:           115/63 mmHg Patient Gender: M                HR:           77 bpm. Exam Location:  Inpatient  Procedure: 2D Echo, Cardiac Doppler and Color Doppler (Both Spectral and Color            Flow Doppler were utilized during procedure).                              MODIFIED REPORT: This report was modified by Redell Shallow MD on 01/24/2024 due to change.  Indications:     R07.9* Chest pain, unspecified. Elevated troponin   History:         Patient has prior history of Echocardiogram examinations, most                  recent 09/23/2020. Abnormal ECG, Arrythmias:Atrial Fibrillation,                  Signs/Symptoms:Altered Mental Status and                  Dizziness/Lightheadedness; Risk Factors:Hypertension.   Sonographer:     Ellouise Mose RDCS Referring Phys:  [id number] ATLAS Diagnosing Phys: Redell Shallow MD  IMPRESSIONS   1. Left ventricular ejection fraction, by estimation,  is 45 to 50%. Left ventricular ejection fraction by 2D MOD biplane is 47.7 %. The left ventricle has mildly decreased function. The left ventricle demonstrates global hypokinesis. There is mild  concentric left ventricular hypertrophy. Left ventricular diastolic parameters are consistent with Grade I diastolic dysfunction (impaired relaxation).  2. Right ventricular systolic function is normal. The right ventricular size is mildly enlarged. Tricuspid regurgitation signal is inadequate for assessing PA pressure. The estimated right ventricular systolic pressure is 39.4 mmHg.  3. The mitral valve is normal in structure. Trivial mitral valve regurgitation. No evidence of mitral stenosis.  4. The aortic valve is tricuspid. Aortic valve regurgitation is trivial. No aortic stenosis is present.  5. The inferior vena cava is normal in size with greater than 50% respiratory variability, suggesting right atrial pressure of 3 mmHg.  FINDINGS  Left Ventricle: Left ventricular ejection fraction, by estimation, is 45 to 50%. Left ventricular ejection fraction by 2D MOD biplane is 47.7 %. The left ventricle has mildly decreased function. The left ventricle demonstrates global hypokinesis. The  left ventricular internal cavity size was normal in size. There is mild concentric left ventricular hypertrophy. Left ventricular diastolic parameters are consistent with Grade I diastolic dysfunction (impaired relaxation).  Right Ventricle: The right ventricular size is mildly enlarged. Right ventricular systolic function is normal. Tricuspid regurgitation signal is inadequate for assessing PA pressure. The tricuspid regurgitant velocity is 2.80 m/s, and with an assumed  right atrial pressure of 8 mmHg, the estimated right ventricular systolic pressure is 39.4 mmHg.  Left Atrium: Left atrial size was normal in size.  Right Atrium: Right atrial size was normal in size.  Pericardium: There is no evidence of pericardial  effusion.  Mitral Valve: The mitral valve is normal in structure. Trivial mitral valve regurgitation. No evidence of mitral valve stenosis.  Tricuspid Valve: The tricuspid valve is normal in structure. Tricuspid  valve regurgitation is trivial. No evidence of tricuspid stenosis.  Aortic Valve: The aortic valve is tricuspid. Aortic valve regurgitation is trivial. No aortic stenosis is present.  Pulmonic Valve: The pulmonic valve was normal in structure. Pulmonic valve regurgitation is not visualized. No evidence of pulmonic stenosis.  Aorta: The aortic root is normal in size and structure.  Venous: The inferior vena cava is normal in size with greater than 50% respiratory variability, suggesting right atrial pressure of 3 mmHg.  IAS/Shunts: No atrial level shunt detected by color flow Doppler.    LEFT VENTRICLE PLAX 2D                        Biplane EF (MOD) LVIDd:         4.20 cm         LV Biplane EF:   Left LVIDs:         2.80 cm                          ventricular LV PW:         0.90 cm                          ejection LV IVS:        1.00 cm                          fraction by LVOT diam:     2.40 cm                          2D MOD LV SV:         79                               biplane is LV SV Index:   41                               47.7 %. LVOT Area:     4.52 cm                                Diastology                                LV e' medial:    5.87 cm/s LV Volumes (MOD)               LV E/e' medial:  8.7 LV vol d, MOD    89.2 ml       LV e' lateral:   10.20 cm/s A2C:                           LV E/e' lateral: 5.0 LV vol d, MOD    94.1 ml A4C: LV vol s, MOD    45.7 ml A2C: LV vol s, MOD    50.9 ml A4C: LV SV MOD A2C:   43.5 ml LV SV MOD A4C:   94.1 ml LV SV MOD BP:    44.8 ml  RIGHT VENTRICLE  IVC RV S prime:     11.30 cm/s  IVC diam: 1.60 cm TAPSE (M-mode): 1.6 cm                             PULMONARY VEINS                              Diastolic Velocity: 35.60 cm/s                             S/D Velocity:       1.20                             Systolic Velocity:  43.70 cm/s  LEFT ATRIUM           Index        RIGHT ATRIUM           Index LA Vol (A2C): 24.7 ml 12.89 ml/m  RA Area:     13.60 cm LA Vol (A4C): 38.9 ml 20.29 ml/m  RA Volume:   30.80 ml  16.07 ml/m  AORTIC VALVE LVOT Vmax:   87.00 cm/s LVOT Vmean:  57.800 cm/s LVOT VTI:    0.174 m   AORTA Ao Root diam: 3.80 cm Ao Asc diam:  3.30 cm  MITRAL VALVE               TRICUSPID VALVE MV Area (PHT): 2.99 cm    TR Peak grad:   31.4 mmHg MV Decel Time: 254 msec    TR Vmax:        280.00 cm/s MV E velocity: 51.00 cm/s MV A velocity: 66.00 cm/s  SHUNTS MV E/A ratio:  0.77        Systemic VTI:  0.17 m                            Systemic Diam: 2.40 cm  Redell Shallow MD Electronically signed by Redell Shallow MD Signature Date/Time: 01/24/2024/3:42:04 PM      Final (Updated)      Assessment & Plan:    See Problem List for Assessment and Plan of chronic medical problems.       [1]  Current Outpatient Medications on File Prior to Visit  Medication Sig Dispense Refill   acetaminophen  (TYLENOL ) 500 MG tablet Take 500-1,000 mg by mouth every 8 (eight) hours as needed for mild pain or headache.      apixaban  (ELIQUIS ) 5 MG TABS tablet Take 1 tablet (5 mg total) by mouth 2 (two) times daily. 60 tablet 5   Ascorbic Acid  (VITAMIN C ) 1000 MG tablet Take 1,000 mg by mouth daily with breakfast.      augmented betamethasone dipropionate (DIPROLENE-AF) 0.05 % cream Apply 1 application  topically 2 (two) times daily as needed (Grover's disease- legs).     azithromycin  (ZITHROMAX ) 500 MG tablet Take on 1/16 and again on 1/17 and then stop 2 each 0   brimonidine  (ALPHAGAN ) 0.2 % ophthalmic solution Place 1 drop into the left eye in the morning and at bedtime.     cefdinir  (OMNICEF ) 300 MG capsule Take 1 capsule (300 mg total) by mouth 2 (two) times daily. 8 capsule  0   cyanocobalamin  (VITAMIN B12) 1000 MCG/ML injection INJECT  INTO THE MUSCLE ONCE MONTHLY 10 mL 0   dorzolamide -timolol  (COSOPT ) 22.3-6.8 MG/ML ophthalmic solution Place 1 drop into the left eye in the morning and at bedtime.     folic acid  (FOLVITE ) 1 MG tablet Take 1 tablet (1 mg total) by mouth daily. 30 tablet 0   latanoprost  (XALATAN ) 0.005 % ophthalmic solution Place 1 drop into the left eye at bedtime.     levETIRAcetam  (KEPPRA ) 750 MG tablet Take 1 tablet (750 mg total) by mouth 2 (two) times daily. 180 tablet 0   meclizine  (ANTIVERT ) 25 MG tablet Take 1 tablet (25 mg total) by mouth 3 (three) times daily as needed for dizziness. 30 tablet 0   metoprolol  succinate (TOPROL -XL) 25 MG 24 hr tablet Take 0.5 tablets (12.5 mg total) by mouth daily. 45 tablet 3   Netarsudil  Dimesylate 0.02 % SOLN Place 1 drop into the left eye at bedtime. Rhopressa      pantoprazole  (PROTONIX ) 40 MG tablet Take 1 tablet (40 mg total) by mouth daily before breakfast. (Patient taking differently: Take 40 mg by mouth as needed.) 90 tablet 0   polyethylene glycol (MIRALAX  / GLYCOLAX ) 17 g packet Take 17 g by mouth daily.     sertraline  (ZOLOFT ) 25 MG tablet Take 1 tablet (25 mg total) by mouth daily. (Patient not taking: Reported on 01/26/2024) 30 tablet 3   TUMS 500 MG chewable tablet Chew 1 tablet by mouth 3 (three) times daily as needed for indigestion or heartburn.     No current facility-administered medications on file prior to visit.   "

## 2024-01-31 ENCOUNTER — Encounter (HOSPITAL_COMMUNITY)

## 2024-01-31 ENCOUNTER — Inpatient Hospital Stay: Admitting: Internal Medicine

## 2024-01-31 ENCOUNTER — Other Ambulatory Visit: Payer: Self-pay | Admitting: Internal Medicine

## 2024-01-31 DIAGNOSIS — I480 Paroxysmal atrial fibrillation: Secondary | ICD-10-CM

## 2024-01-31 DIAGNOSIS — E538 Deficiency of other specified B group vitamins: Secondary | ICD-10-CM

## 2024-01-31 DIAGNOSIS — E8809 Other disorders of plasma-protein metabolism, not elsewhere classified: Secondary | ICD-10-CM | POA: Insufficient documentation

## 2024-01-31 DIAGNOSIS — G40909 Epilepsy, unspecified, not intractable, without status epilepticus: Secondary | ICD-10-CM

## 2024-01-31 DIAGNOSIS — J189 Pneumonia, unspecified organism: Secondary | ICD-10-CM

## 2024-01-31 DIAGNOSIS — G3184 Mild cognitive impairment, so stated: Secondary | ICD-10-CM

## 2024-01-31 DIAGNOSIS — D649 Anemia, unspecified: Secondary | ICD-10-CM

## 2024-01-31 DIAGNOSIS — R42 Dizziness and giddiness: Secondary | ICD-10-CM

## 2024-01-31 DIAGNOSIS — R195 Other fecal abnormalities: Secondary | ICD-10-CM

## 2024-01-31 DIAGNOSIS — I429 Cardiomyopathy, unspecified: Secondary | ICD-10-CM | POA: Insufficient documentation

## 2024-01-31 MED ORDER — LEVETIRACETAM 750 MG PO TABS: 750.0000 mg | ORAL_TABLET | Freq: Two times a day (BID) | ORAL | 0 refills | Status: AC

## 2024-01-31 NOTE — Patient Instructions (Incomplete)
" ° ° °  Chest x-ray ordered-should be done around the first week in February  Follow-up with cardiology   Blood work was ordered.       Medications changes include :   None    A referral was ordered and someone will call you to schedule an appointment.     No follow-ups on file.  "

## 2024-01-31 NOTE — Assessment & Plan Note (Signed)
 New 2022 EF 55-60% 2026 EF 45-50% with LV global hypokinesis Following with cardiology-advised to follow-up for further evaluation

## 2024-01-31 NOTE — Assessment & Plan Note (Signed)
 Acute Chest x-ray in emergency room showed possible left lower lobe infiltrate Treated with ceftriaxone , azithromycin  Discharged home on cefdinir  and azithromycin  Symptoms had improved while in the hospital Needs follow-up chest x-ray first week of February-ordered

## 2024-01-31 NOTE — Assessment & Plan Note (Signed)
 Chronic Some of what his family thought were TIA episodes likely seizures Continue Keppra  750 mg twice daily Following with neurology

## 2024-01-31 NOTE — Assessment & Plan Note (Signed)
 Acute Baseline hemoglobin 10-while in the hospital hemoglobin 8-9 Heme positive stool Started on IV iron , folic acid  GI to arrange outpatient IV infusion GI follow-up

## 2024-01-31 NOTE — Assessment & Plan Note (Signed)
 Chronic Following with cardiology Rate controlled He is on Eliquis  5 mg twice daily and metoprolol  XL 12.5 mg daily

## 2024-01-31 NOTE — Assessment & Plan Note (Signed)
 Chronic Lab Results  Component Value Date   VITAMINB12 847 01/23/2024   B12 level good Continue B12 supplementation

## 2024-01-31 NOTE — Assessment & Plan Note (Signed)
Subacute   

## 2024-01-31 NOTE — Assessment & Plan Note (Addendum)
 Recent episode prior to hospitalization Has had dizziness in the past that was thought to be positional Just prior to hospitalization he had episode of dizziness Quickly resolved after given meclizine  No further episodes

## 2024-01-31 NOTE — Telephone Encounter (Signed)
 Copied from CRM #[id number]. Topic: Clinical - Medication Refill >> Jan 31, 2024  1:28 PM Aleatha C wrote: Medication: levETIRAcetam  (KEPPRA ) 750 MG tablet  Has the patient contacted their pharmacy? Yes (Agent: If no, request that the patient contact the pharmacy for the refill. If patient does not wish to contact the pharmacy document the reason why and proceed with request.) (Agent: If yes, when and what did the pharmacy advise?)  This is the patient's preferred pharmacy:  [facility] # [address], [place] - [address] [place] [address] Phone: [phone] Fax: [phone]  Is this the correct pharmacy for this prescription? Yes If no, delete pharmacy and type the correct one.   Has the prescription been filled recently? No  Is the patient out of the medication? No  Has the patient been seen for an appointment in the last year OR does the patient have an upcoming appointment? Yes  Can we respond through MyChart? No  Agent: Please be advised that Rx refills may take up to 3 business days. We ask that you follow-up with your pharmacy.

## 2024-01-31 NOTE — Assessment & Plan Note (Signed)
 Acute on chronic Baseline hemoglobin 10 While in hospital hemoglobin 8-9 GI consulted Received IV iron , started on daily folic acid  GI to arrange outpatient IV infusion GI follow-up

## 2024-02-01 NOTE — Progress Notes (Signed)
 "     Subjective:    Patient ID: Michael [name], male    DOB: April 27, 1932, 89 y.o.   MRN: [id number]     HPI Michael [name] is here for follow up from the hospital.  He is here today with his caregiver.  His daughter joined us  via phone later in the visit.  Admitted 1/14 - 1/15  Recommendations for Outpatient Follow-up:  F/u on low EF and hemoccult + stool  Repeat CXR in 3 wks F/u on low albumin level  Presented with dizziness.  He had a URI a couple of weeks prior and still had a cough.  That morning he was dizzy when he sat up in bed.  His gait was unsteady.  He was not able to ambulate with his walker.  In the ED he was nauseous and vomited.  No fever, chest pain, edema, no abdominal pain.        WBC 6.1, hgb 9, trop 38->40, BNP 1587.  CXR hazy opacity at [facility].  Chronic fibrotic interstitial lung disease.  CTA head and neck w/o acute concern.  MRI brain w/o acute abnormality.  VSS   Dizziness ? Vertigo Resolved quickly after given meclizine  No further episodes  CAP -LLL Possible LLL on CXR Treated with ceftriaxone  and azithro - > cefdinir  x 4 more days, azithromycin  x 2 days Symptoms improved  Dehydration Treated with Normal saline  Hemoccult pos stool H/o inactive Crohn's dz, h/o esoph dilation GI consulted Held off on procedure - outpatient follow up Continue daily protonix   Chronic iron  deficiency anemia Baseline hgb 10 In hospital 8-9 Given IV iron , started on daily folic acid  GI to arrange outpatient IV infusion  Mild troponin elevation Echo EF 45-50% and grade 1 DD Needs f/u with cardiology  PAF Eliquis , metoprolol   Mild cognitive impairment  Seizures Continue Keppra   Hypoalbuminemia Albumin 2.6   Discussed the use of AI scribe software for clinical note transcription with the patient, who gave verbal consent to proceed.  History of Present Illness Michael [name] is a 89 year old male who presents for follow-up care after a recent hospitalization  for vertigo and pneumonia.  He experienced an episode of vertigo described as a 'crazy' and 'heavy' feeling in his head, which resolved quickly with meclizine . There was a concern about ear issues as he had a painful ear wax removal prior to the episode.   During hospitalization, he was diagnosed with pneumonia and low blood counts. He completed a course of antibiotics and received iron  supplementation, which improved his energy levels. He has a persistent runny nose for over two weeks, some coughing, and is still spitting up phlegm, but notes improvement. No fever, nasal congestion, sore throat, headaches, chest pain, leg swelling, or heart racing.  His appetite has decreased since his illness, and he sometimes skips lunch, although he consumes two Ensure drinks daily, which make him feel full. He eats breakfast and dinner well. He feels weaker than usual and sometimes off balance. He has loose stools but denies constipation or blood in the stool. He is on medication for heartburn, which is controlled.  He has a history of Crohn's disease, which has been quiet with no recent diarrhea. He reports sleeping issues at times, waking up around 4 AM and being unable to return to sleep, leading to frequent napping during the day. He consumes a breakfast of eggs, grits, and occasionally French toast with gravy.   He is currently on folic acid  and has medication  available if vertigo recurs. His protein intake is being monitored, with Ensure Plus being considered for higher protein content. He has a history of anemia, with hemoglobin levels typically in the 8s and 9s, and has received iron  infusions in the past, which he felt improved his condition.       Medications and allergies reviewed with patient and updated if appropriate.  Medications Ordered Prior to Encounter[1]   Review of Systems  Constitutional:  Positive for appetite change (decreased). Negative for fever.       Weaker and off balance   HENT:  Positive for rhinorrhea. Negative for congestion and sore throat.   Respiratory:  Positive for cough (still productive at times). Negative for shortness of breath and wheezing.   Cardiovascular:  Negative for chest pain, palpitations and leg swelling.  Gastrointestinal:  Positive for diarrhea (loose). Negative for abdominal pain, blood in stool (no melena), constipation and nausea.       No gerd  Neurological:  Negative for dizziness and headaches.       Objective:   Vitals:   02/02/24 1059  BP: 104/70  Pulse: 68  Temp: 98 F (36.7 C)  SpO2: 94%   BP Readings from Last 3 Encounters:  02/02/24 104/70  01/25/24 128/69  01/01/24 126/70   Wt Readings from Last 3 Encounters:  02/02/24 154 lb (69.9 kg)  01/22/24 159 lb 13.3 oz (72.5 kg)  01/01/24 160 lb (72.6 kg)   Body mass index is 21.48 kg/m.    Physical Exam Constitutional:      General: He is not in acute distress.    Appearance: Normal appearance. He is not ill-appearing.  HENT:     Head: Normocephalic and atraumatic.  Eyes:     Conjunctiva/sclera: Conjunctivae normal.  Cardiovascular:     Rate and Rhythm: Normal rate and regular rhythm.     Heart sounds: Normal heart sounds.  Pulmonary:     Effort: Pulmonary effort is normal. No respiratory distress.     Breath sounds: Rales (Dry bibasilar crackles) present. No wheezing.  Abdominal:     General: There is no distension.     Palpations: Abdomen is soft.     Tenderness: There is no abdominal tenderness.  Musculoskeletal:     Cervical back: Neck supple. No tenderness.     Right lower leg: Edema (1+ nonpitting) present.     Left lower leg: Edema (1+ nonpitting) present.  Lymphadenopathy:     Cervical: No cervical adenopathy.  Skin:    General: Skin is warm and dry.     Findings: No rash.  Neurological:     Mental Status: He is alert. Mental status is at baseline.  Psychiatric:        Mood and Affect: Mood normal.        Lab Results  Component  Value Date   WBC 6.1 01/25/2024   HGB 9.5 (L) 01/25/2024   HCT 29.0 (L) 01/25/2024   PLT 169 01/25/2024   GLUCOSE 83 01/24/2024   CHOL 82 02/21/2023   TRIG 92.0 02/21/2023   HDL 31.40 (L) 02/21/2023   LDLDIRECT 60.6 09/11/2019   LDLCALC 32 02/21/2023   ALT 9 01/24/2024   AST 19 01/24/2024   NA 131 (L) 01/24/2024   K 3.4 (L) 01/24/2024   CL 101 01/24/2024   CREATININE 0.93 01/24/2024   BUN 12 01/24/2024   CO2 20 (L) 01/24/2024   TSH 2.840 01/23/2024   INR 1.5 (H) 01/22/2024   HGBA1C 5.6 01/14/2021  ECHOCARDIOGRAM COMPLETE    ECHOCARDIOGRAM REPORT       Patient Name:   Michael [name] Date of Exam: 01/24/2024 Medical Rec #:  [id number]        Height:       71.0 in Accession #:    [id number]       Weight:       159.8 lb Date of Birth:  September 07, 1932         BSA:          1.917 m Patient Age:    89 years         BP:           115/63 mmHg Patient Gender: M                HR:           77 bpm. Exam Location:  Inpatient  Procedure: 2D Echo, Cardiac Doppler and Color Doppler (Both Spectral and Color            Flow Doppler were utilized during procedure).                              MODIFIED REPORT: This report was modified by Redell Shallow MD on 01/24/2024 due to change.  Indications:     R07.9* Chest pain, unspecified. Elevated troponin   History:         Patient has prior history of Echocardiogram examinations, most                  recent 09/23/2020. Abnormal ECG, Arrythmias:Atrial Fibrillation,                  Signs/Symptoms:Altered Mental Status and                  Dizziness/Lightheadedness; Risk Factors:Hypertension.   Sonographer:     Ellouise Mose RDCS Referring Phys:  [id number] ATLAS Diagnosing Phys: Redell Shallow MD  IMPRESSIONS   1. Left ventricular ejection fraction, by estimation, is 45 to 50%. Left ventricular ejection fraction by 2D MOD biplane is 47.7 %. The left ventricle has mildly decreased function. The left ventricle demonstrates global hypokinesis.  There is mild  concentric left ventricular hypertrophy. Left ventricular diastolic parameters are consistent with Grade I diastolic dysfunction (impaired relaxation).  2. Right ventricular systolic function is normal. The right ventricular size is mildly enlarged. Tricuspid regurgitation signal is inadequate for assessing PA pressure. The estimated right ventricular systolic pressure is 39.4 mmHg.  3. The mitral valve is normal in structure. Trivial mitral valve regurgitation. No evidence of mitral stenosis.  4. The aortic valve is tricuspid. Aortic valve regurgitation is trivial. No aortic stenosis is present.  5. The inferior vena cava is normal in size with greater than 50% respiratory variability, suggesting right atrial pressure of 3 mmHg.  FINDINGS  Left Ventricle: Left ventricular ejection fraction, by estimation, is 45 to 50%. Left ventricular ejection fraction by 2D MOD biplane is 47.7 %. The left ventricle has mildly decreased function. The left ventricle demonstrates global hypokinesis. The  left ventricular internal cavity size was normal in size. There is mild concentric left ventricular hypertrophy. Left ventricular diastolic parameters are consistent with Grade I diastolic dysfunction (impaired relaxation).  Right Ventricle: The right ventricular size is mildly enlarged. Right ventricular systolic function is normal. Tricuspid regurgitation signal is inadequate for assessing PA pressure. The tricuspid regurgitant velocity is 2.80 m/s, and with  an assumed  right atrial pressure of 8 mmHg, the estimated right ventricular systolic pressure is 39.4 mmHg.  Left Atrium: Left atrial size was normal in size.  Right Atrium: Right atrial size was normal in size.  Pericardium: There is no evidence of pericardial effusion.  Mitral Valve: The mitral valve is normal in structure. Trivial mitral valve regurgitation. No evidence of mitral valve stenosis.  Tricuspid Valve: The tricuspid valve is  normal in structure. Tricuspid valve regurgitation is trivial. No evidence of tricuspid stenosis.  Aortic Valve: The aortic valve is tricuspid. Aortic valve regurgitation is trivial. No aortic stenosis is present.  Pulmonic Valve: The pulmonic valve was normal in structure. Pulmonic valve regurgitation is not visualized. No evidence of pulmonic stenosis.  Aorta: The aortic root is normal in size and structure.  Venous: The inferior vena cava is normal in size with greater than 50% respiratory variability, suggesting right atrial pressure of 3 mmHg.  IAS/Shunts: No atrial level shunt detected by color flow Doppler.    LEFT VENTRICLE PLAX 2D                        Biplane EF (MOD) LVIDd:         4.20 cm         LV Biplane EF:   Left LVIDs:         2.80 cm                          ventricular LV PW:         0.90 cm                          ejection LV IVS:        1.00 cm                          fraction by LVOT diam:     2.40 cm                          2D MOD LV SV:         79                               biplane is LV SV Index:   41                               47.7 %. LVOT Area:     4.52 cm                                Diastology                                LV e' medial:    5.87 cm/s LV Volumes (MOD)               LV E/e' medial:  8.7 LV vol d, MOD    89.2 ml       LV e' lateral:   10.20 cm/s A2C:  LV E/e' lateral: 5.0 LV vol d, MOD    94.1 ml A4C: LV vol s, MOD    45.7 ml A2C: LV vol s, MOD    50.9 ml A4C: LV SV MOD A2C:   43.5 ml LV SV MOD A4C:   94.1 ml LV SV MOD BP:    44.8 ml  RIGHT VENTRICLE             IVC RV S prime:     11.30 cm/s  IVC diam: 1.60 cm TAPSE (M-mode): 1.6 cm                             PULMONARY VEINS                             Diastolic Velocity: 35.60 cm/s                             S/D Velocity:       1.20                             Systolic Velocity:  43.70 cm/s  LEFT ATRIUM           Index        RIGHT  ATRIUM           Index LA Vol (A2C): 24.7 ml 12.89 ml/m  RA Area:     13.60 cm LA Vol (A4C): 38.9 ml 20.29 ml/m  RA Volume:   30.80 ml  16.07 ml/m  AORTIC VALVE LVOT Vmax:   87.00 cm/s LVOT Vmean:  57.800 cm/s LVOT VTI:    0.174 m   AORTA Ao Root diam: 3.80 cm Ao Asc diam:  3.30 cm  MITRAL VALVE               TRICUSPID VALVE MV Area (PHT): 2.99 cm    TR Peak grad:   31.4 mmHg MV Decel Time: 254 msec    TR Vmax:        280.00 cm/s MV E velocity: 51.00 cm/s MV A velocity: 66.00 cm/s  SHUNTS MV E/A ratio:  0.77        Systemic VTI:  0.17 m                            Systemic Diam: 2.40 cm  Redell Shallow MD Electronically signed by Redell Shallow MD Signature Date/Time: 01/24/2024/3:42:04 PM      Final (Updated)      Assessment & Plan:    See Problem List for Assessment and Plan of chronic medical problems.        [1]  Current Outpatient Medications on File Prior to Visit  Medication Sig Dispense Refill   acetaminophen  (TYLENOL ) 500 MG tablet Take 500-1,000 mg by mouth every 8 (eight) hours as needed for mild pain or headache.      apixaban  (ELIQUIS ) 5 MG TABS tablet Take 1 tablet (5 mg total) by mouth 2 (two) times daily. 60 tablet 5   Ascorbic Acid  (VITAMIN C ) 1000 MG tablet Take 1,000 mg by mouth daily with breakfast.      augmented betamethasone dipropionate (DIPROLENE-AF) 0.05 % cream Apply 1 application  topically 2 (two) times daily as needed (Grover's disease- legs).  brimonidine  (ALPHAGAN ) 0.2 % ophthalmic solution Place 1 drop into the left eye in the morning and at bedtime.     cyanocobalamin  (VITAMIN B12) 1000 MCG/ML injection INJECT 1ML INTO THE MUSCLE ONCE MONTHLY 10 mL 0   dorzolamide -timolol  (COSOPT ) 22.3-6.8 MG/ML ophthalmic solution Place 1 drop into the left eye in the morning and at bedtime.     folic acid  (FOLVITE ) 1 MG tablet Take 1 tablet (1 mg total) by mouth daily. 30 tablet 0   latanoprost  (XALATAN ) 0.005 % ophthalmic solution Place 1 drop  into the left eye at bedtime.     levETIRAcetam  (KEPPRA ) 750 MG tablet Take 1 tablet (750 mg total) by mouth 2 (two) times daily. 180 tablet 0   meclizine  (ANTIVERT ) 25 MG tablet Take 1 tablet (25 mg total) by mouth 3 (three) times daily as needed for dizziness. 30 tablet 0   metoprolol  succinate (TOPROL -XL) 25 MG 24 hr tablet Take 0.5 tablets (12.5 mg total) by mouth daily. 45 tablet 3   Netarsudil  Dimesylate 0.02 % SOLN Place 1 drop into the left eye at bedtime. Rhopressa      pantoprazole  (PROTONIX ) 40 MG tablet Take 1 tablet (40 mg total) by mouth daily before breakfast. (Patient taking differently: Take 40 mg by mouth as needed.) 90 tablet 0   polyethylene glycol (MIRALAX  / GLYCOLAX ) 17 g packet Take 17 g by mouth daily.     TUMS 500 MG chewable tablet Chew 1 tablet by mouth 3 (three) times daily as needed for indigestion or heartburn.     No current facility-administered medications on file prior to visit.   "

## 2024-02-02 ENCOUNTER — Encounter: Payer: Self-pay | Admitting: Internal Medicine

## 2024-02-02 ENCOUNTER — Ambulatory Visit: Admitting: Internal Medicine

## 2024-02-02 ENCOUNTER — Telehealth: Payer: Self-pay | Admitting: Pharmacy Technician

## 2024-02-02 VITALS — BP 104/70 | HR 68 | Temp 98.0°F | Ht 71.0 in | Wt 154.0 lb

## 2024-02-02 DIAGNOSIS — E46 Unspecified protein-calorie malnutrition: Secondary | ICD-10-CM

## 2024-02-02 DIAGNOSIS — R42 Dizziness and giddiness: Secondary | ICD-10-CM

## 2024-02-02 DIAGNOSIS — J189 Pneumonia, unspecified organism: Secondary | ICD-10-CM

## 2024-02-02 DIAGNOSIS — R2689 Other abnormalities of gait and mobility: Secondary | ICD-10-CM

## 2024-02-02 DIAGNOSIS — D649 Anemia, unspecified: Secondary | ICD-10-CM

## 2024-02-02 DIAGNOSIS — R195 Other fecal abnormalities: Secondary | ICD-10-CM

## 2024-02-02 DIAGNOSIS — D500 Iron deficiency anemia secondary to blood loss (chronic): Secondary | ICD-10-CM

## 2024-02-02 MED ORDER — IPRATROPIUM BROMIDE 0.03 % NA SOLN: 2.0000 | Freq: Two times a day (BID) | NASAL | 12 refills | Status: AC

## 2024-02-02 NOTE — Assessment & Plan Note (Signed)
 Chronic Poor balance and physical deconditioning-worse after recent hospitalization Daughter will see if they can arrange physical therapy through the [facility] so that there is no co-pay She will let me know if I need to order

## 2024-02-02 NOTE — Assessment & Plan Note (Signed)
 Acute Baseline hemoglobin 10-while in the hospital hemoglobin 8-9 Heme positive stool Started on IV iron , folic acid  GI to arrange outpatient IV infusion GI follow-up

## 2024-02-02 NOTE — Assessment & Plan Note (Signed)
 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

## 2024-02-02 NOTE — Assessment & Plan Note (Addendum)
 Recent episode prior to hospitalization Has had dizziness in the past that was thought to be positional Just prior to hospitalization he had episode of dizziness Quickly resolved after given meclizine  No further episodes Has meclizine  at home if needed

## 2024-02-02 NOTE — Assessment & Plan Note (Addendum)
 Subacute Currently eating well since discharge-eating a good breakfast and a good dinner Currently drinking 2 Ensure daily-daughter to get the highest protein and Ensure for him Will monitor albumin levels

## 2024-02-02 NOTE — Assessment & Plan Note (Signed)
 Acute Chest x-ray in emergency room showed possible left lower lobe infiltrate Treated with ceftriaxone , azithromycin  Discharged home on cefdinir  and azithromycin  Symptoms had improved while in the hospital Needs follow-up chest x-ray first week of February-ordered

## 2024-02-02 NOTE — Addendum Note (Signed)
 Addended by: DAYNE SHERRY RAMAN on: 02/02/2024 01:39 PM   Modules accepted: Orders

## 2024-02-02 NOTE — Patient Instructions (Addendum)
" ° °  A chest xray was ordered - have this done the beginning of February.     Follow up with Cardiology - Verlin Lonni BIRCH, MD   Blood work ordered - have this done about 2 months ( you do not need to fast)   I will order an iron  infusion   Medications changes include :   ipratropium nasal spray for runny nose - use every 12 hours  Let me know if I need to order PT.   Return in about 4 months (around 06/01/2024) for follow up.  "

## 2024-02-02 NOTE — Telephone Encounter (Signed)
" °  Devki, the patient will be scheduled as soon as possible.   Auth Submission: NO AUTH NEEDED Site of care: Site of care: [facility] Payer: [employer] Medicare / [employer] Administration Medication & CPT/J Code(s) submitted: Feraheme (ferumoxytol) [id number] Diagnosis Code: D50.9, D64.9 Route of submission (phone, fax, portal): n/a Phone # Fax # Auth type: Buy/Bill PB Units/visits requested: 510 mg x 1 Reference number: n/a Approval from: 02/02/2024 to 05/09/2024        "

## 2024-02-02 NOTE — Assessment & Plan Note (Signed)
 Acute on chronic Baseline hemoglobin 10 While in hospital hemoglobin 8-9 GI consulted Received IV iron , started on daily folic acid  GI to arrange outpatient IV infusion GI follow-up

## 2024-02-06 ENCOUNTER — Ambulatory Visit: Admitting: Internal Medicine

## 2024-02-07 ENCOUNTER — Ambulatory Visit: Admitting: Internal Medicine

## 2024-02-14 ENCOUNTER — Ambulatory Visit: Admitting: Cardiovascular Disease

## 2024-02-14 VITALS — BP 112/58 | HR 72 | Ht 71.0 in | Wt 150.0 lb

## 2024-02-14 DIAGNOSIS — I480 Paroxysmal atrial fibrillation: Secondary | ICD-10-CM | POA: Diagnosis not present

## 2024-02-14 DIAGNOSIS — I428 Other cardiomyopathies: Secondary | ICD-10-CM | POA: Diagnosis not present

## 2024-02-14 NOTE — Patient Instructions (Signed)
 Medication Instructions:  The current medical regimen is effective;  continue present plan and medications.  *If you need a refill on your cardiac medications before your next appointment, please call your pharmacy*  Follow-Up: At [facility], you and your health needs are our priority.  As part of our continuing mission to provide you with exceptional heart care, our providers are all part of one team.  This team includes your primary Cardiologist (physician) and Advanced Practice Providers or APPs (Physician Assistants and Nurse Practitioners) who all work together to provide you with the care you need, when you need it.  Your next appointment:   6 month(s)  Provider:   Lonni Cash, MD    We recommend signing up for the patient portal called MyChart.  Sign up information is provided on this After Visit Summary.  MyChart is used to connect with patients for Virtual Visits (Telemedicine).  Patients are able to view lab/test results, encounter notes, upcoming appointments, etc.  Non-urgent messages can be sent to your provider as well.   To learn more about what you can do with MyChart, go to [email or web address].

## 2024-02-14 NOTE — Progress Notes (Signed)
 "  Chief Complaint  Patient presents with   Follow-up    Atrial fibrillation   History of Present Illness: 89 yo male with history of paroxysmal atrial fibrillation, TIA, memory loss, DVT, adrenal insufficiency, Crohn's disease and mild carotid artery disease here today for cardiac follow up. He was seen in our office as a new patient in November 2021 after he had been admitted to [facility] with a TIA. Carotid artery dopplers in September 2021 with mild bilateral carotid artery disease. Echo September 2021 with LVEF=55-60%, no valve disease. CT showed probably remote L thalamic lacunar infarct which was confirmed on MRI brain. Cardiac monitor showed atrial fibrillation. He was started on Eliquis  and a beta blocker. He was admitted to [facility] September 2022 with balance issues but workup for stroke was negative with brain MRI. Echo September 2022 with LVEF=55-60%. Trivial AI and MR. He was admitted to [facility] 10/26/21 with altered mental status and was found to have pneumonia, Covid positive.  He was febrile during the admission and has rapid atrial fib. He converted to sinus with IV Cardizem . He was admitted to [facility] in January 2026 with pneumonia. Echo January 2026 with LVEF=45-50% with global HK. Trivial MR.   He is here today for follow up. The patient denies any chest pain, dyspnea, palpitations, lower extremity edema, orthopnea, PND, dizziness, near syncope or syncope.   Primary Care Physician: [name], MD  Past Medical History:  Diagnosis Date   Adrenal insufficiency    Anemia    Atrial fibrillation (HCC)    Avascular necrosis (HCC)    Clavicle fracture 11/02/2017   Colon polyp    Colon polyps    Crohn's disease (HCC)    Glaucoma    Inguinal hernia recurrent bilateral    Low testosterone     Osteopenia    Retinal ischemia     Past Surgical History:  Procedure Laterality Date   ABDOMINAL SURGERY     BIOPSY  03/19/2020   Procedure: BIOPSY;  Surgeon: Leigh Elspeth SQUIBB, MD;  Location:  [facility];  Service: Gastroenterology;;   COLONOSCOPY N/A 12/13/2015   Procedure: COLONOSCOPY;  Surgeon: Lynwood Bohr, MD;  Location: [facility];  Service: Endoscopy;  Laterality: N/A;   COLONOSCOPY WITH PROPOFOL  N/A 03/25/2019   Procedure: COLONOSCOPY WITH PROPOFOL ;  Surgeon: Leigh Elspeth SQUIBB, MD;  Location: [facility];  Service: Gastroenterology;  Laterality: N/A;   COLONOSCOPY WITH PROPOFOL  N/A 08/26/2019   Procedure: COLONOSCOPY WITH PROPOFOL ;  Surgeon: Leigh Elspeth SQUIBB, MD;  Location: [facility];  Service: Gastroenterology;  Laterality: N/A;   COLONOSCOPY WITH PROPOFOL  N/A 03/19/2020   Procedure: COLONOSCOPY WITH PROPOFOL ;  Surgeon: Leigh Elspeth SQUIBB, MD;  Location: [facility];  Service: Gastroenterology;  Laterality: N/A;   ENDOSCOPIC MUCOSAL RESECTION N/A 08/26/2019   Procedure: ENDOSCOPIC MUCOSAL RESECTION;  Surgeon: Leigh Elspeth SQUIBB, MD;  Location: [facility];  Service: Gastroenterology;  Laterality: N/A;   ESOPHAGOGASTRODUODENOSCOPY N/A 11/15/2023   Procedure: EGD (ESOPHAGOGASTRODUODENOSCOPY);  Surgeon: Avram Lupita BRAVO, MD;  Location: [facility];  Service: Gastroenterology;  Laterality: N/A;   FLEXIBLE SIGMOIDOSCOPY N/A 11/15/2023   Procedure: KINGSTON SIDE;  Surgeon: Avram Lupita BRAVO, MD;  Location: [facility];  Service: Gastroenterology;  Laterality: N/A;  Fecal disimpaction under sedation   INGUINAL HERNIA REPAIR Bilateral 05/21/2014   Procedure: OPEN BILATERAL INGUINAL HERNIA REPAIRS WITH MESH;  Surgeon: Donnice Lima, MD;  Location: [facility];  Service: General;  Laterality: Bilateral;   POLYPECTOMY  03/25/2019   Procedure: POLYPECTOMY;  Surgeon: Leigh Elspeth  P, MD;  Location: [facility];  Service: Gastroenterology;;   POLYPECTOMY  08/26/2019   Procedure: POLYPECTOMY;  Surgeon: Leigh Elspeth SQUIBB, MD;  Location: [facility];  Service: Gastroenterology;;   POLYPECTOMY  03/19/2020   Procedure: POLYPECTOMY;  Surgeon:  Leigh Elspeth SQUIBB, MD;  Location: [facility];  Service: Gastroenterology;;   WALDEMAR  08/26/2019   Procedure: Sprayed methylene blue ;  Surgeon: Leigh Elspeth SQUIBB, MD;  Location: [facility];  Service: Gastroenterology;;   SMALL INTESTINE SURGERY  1968, 2001    related to Chrohn's disease   TONSILLECTOMY      Current Outpatient Medications  Medication Sig Dispense Refill   acetaminophen  (TYLENOL ) 500 MG tablet Take 500-1,000 mg by mouth every 8 (eight) hours as needed for mild pain or headache.      apixaban  (ELIQUIS ) 5 MG TABS tablet Take 1 tablet (5 mg total) by mouth 2 (two) times daily. 60 tablet 5   Ascorbic Acid  (VITAMIN C ) 1000 MG tablet Take 1,000 mg by mouth daily with breakfast.      augmented betamethasone dipropionate (DIPROLENE-AF) 0.05 % cream Apply 1 application  topically 2 (two) times daily as needed (Grover's disease- legs).     brimonidine  (ALPHAGAN ) 0.2 % ophthalmic solution Place 1 drop into the left eye in the morning and at bedtime.     cyanocobalamin  (VITAMIN B12) 1000 MCG/ML injection INJECT 1ML INTO THE MUSCLE ONCE MONTHLY 10 mL 0   dorzolamide -timolol  (COSOPT ) 22.3-6.8 MG/ML ophthalmic solution Place 1 drop into the left eye in the morning and at bedtime.     folic acid  (FOLVITE ) 1 MG tablet Take 1 tablet (1 mg total) by mouth daily. 30 tablet 0   ipratropium (ATROVENT ) 0.03 % nasal spray Place 2 sprays into both nostrils every 12 (twelve) hours. 30 mL 12   latanoprost  (XALATAN ) 0.005 % ophthalmic solution Place 1 drop into the left eye at bedtime.     levETIRAcetam  (KEPPRA ) 750 MG tablet Take 1 tablet (750 mg total) by mouth 2 (two) times daily. 180 tablet 0   meclizine  (ANTIVERT ) 25 MG tablet Take 1 tablet (25 mg total) by mouth 3 (three) times daily as needed for dizziness. 30 tablet 0   metoprolol  succinate (TOPROL -XL) 25 MG 24 hr tablet Take 0.5 tablets (12.5 mg total) by mouth daily. 45 tablet 3   Netarsudil  Dimesylate 0.02 % SOLN Place 1 drop  into the left eye at bedtime. Rhopressa      pantoprazole  (PROTONIX ) 40 MG tablet Take 1 tablet (40 mg total) by mouth daily before breakfast. (Patient taking differently: Take 40 mg by mouth as needed.) 90 tablet 0   polyethylene glycol (MIRALAX  / GLYCOLAX ) 17 g packet Take 17 g by mouth daily.     TUMS 500 MG chewable tablet Chew 1 tablet by mouth 3 (three) times daily as needed for indigestion or heartburn.     No current facility-administered medications for this visit.    No Known Allergies  Social History   Socioeconomic History   Marital status: Married    Spouse name: Not on file   Number of children: 3   Years of education: Not on file   Highest education level: Not on file  Occupational History   Occupation: Retired  Tobacco Use   Smoking status: Former    Current packs/day: 0.00    Types: Cigarettes    Quit date: 04/01/1976    Years since quitting: 47.9   Smokeless tobacco: Never  Vaping Use   Vaping status: Never Used  Substance and Sexual Activity   Alcohol use: Yes    Alcohol/week: 2.0 standard drinks of alcohol    Types: 2 Glasses of wine per week    Comment: social   Drug use: No   Sexual activity: Not Currently  Other Topics Concern   Not on file  Social History Narrative   Not on file   Social Drivers of Health   Tobacco Use: Medium Risk (02/02/2024)   Patient History    Smoking Tobacco Use: Former    Smokeless Tobacco Use: Never    Passive Exposure: Not on file  Financial Resource Strain: Low Risk (06/22/2023)   Overall Financial Resource Strain (CARDIA)    Difficulty of Paying Living Expenses: Not hard at all  Food Insecurity: Unknown (01/24/2024)   Epic    Worried About Programme Researcher, Broadcasting/film/video in the Last Year: Never true    [employer] of Food in the Last Year: Not on file  Transportation Needs: Unknown (01/24/2024)   Epic    Lack of Transportation (Medical): No    Lack of Transportation (Non-Medical): Not on file  Physical Activity: Sufficiently  Active (06/22/2023)   Exercise Vital Sign    Days of Exercise per Week: 7 days    Minutes of Exercise per Session: 30 min  Stress: No Stress Concern Present (06/22/2023)   [employer] of Occupational Health - Occupational Stress Questionnaire    Feeling of Stress: Not at all  Social Connections: Unknown (01/24/2024)   Social Connection and Isolation Panel    Frequency of Communication with Friends and Family: Patient unable to answer    Frequency of Social Gatherings with Friends and Family: Not on file    Attends Religious Services: Not on file    Active Member of Clubs or Organizations: Not on file    Attends Banker Meetings: Not on file    Marital Status: Not on file  Intimate Partner Violence: Unknown (01/24/2024)   Epic    Fear of Current or Ex-Partner: No    Emotionally Abused: Not on file    Physically Abused: Not on file    Sexually Abused: Not on file  Depression (PHQ2-9): Low Risk (02/02/2024)   Depression (PHQ2-9)    PHQ-2 Score: 0  Alcohol Screen: Low Risk (06/22/2023)   Alcohol Screen    Last Alcohol Screening Score (AUDIT): 0  Housing: Unknown (01/24/2024)   Epic    Unable to Pay for Housing in the Last Year: No    Number of Times Moved in the Last Year: Not on file    Homeless in the Last Year: Not on file  Utilities: Not At Risk (01/24/2024)   Epic    Threatened with loss of utilities: No  Health Literacy: Adequate Health Literacy (06/22/2023)   B1300 Health Literacy    Frequency of need for help with medical instructions: Never    Family History  Problem Relation Age of Onset   Deep vein thrombosis Mother    Transient ischemic attack Mother    Heart disease Father    Cancer Maternal Aunt        unknown type; dx after 24   Cancer Cousin        maternal cousin; unknown type   Cancer Cousin        paternal cousin; unknown type; dx after 50   Stomach cancer Neg Hx    Colon cancer Neg Hx    Esophageal cancer Neg Hx    Pancreatic cancer Neg  Hx    Rectal cancer Neg Hx    Colon polyps Neg Hx     Review of Systems:  As stated in the HPI and otherwise negative.   BP (!) 112/58 (BP Location: Right Arm, Patient Position: Sitting, Cuff Size: Normal)   Pulse 72   Ht 5' 11 (1.803 m)   Wt 150 lb (68 kg)   SpO2 98%   BMI 20.92 kg/m   Physical Examination: General: Well developed, well nourished, NAD  SKIN: warm, dry. Neuro: No focal deficits  Psychiatric: Mood and affect normal  Neck: No JVD Lungs:Clear bilaterally, no wheezes, rhonci, crackles Cardiovascular: Regular rate and rhythm. No murmurs, gallops or rubs. Abdomen:Soft.  Extremities: No lower extremity edema.    EKG:  EKG is not ordered today. The ekg ordered today demonstrates    Echo September 2022: Recent Labs: 11/16/2023: Magnesium  1.9 01/22/2024: Pro Brain Natriuretic Peptide 1,587.0 01/23/2024: TSH 2.840 01/24/2024: ALT 9; BUN 12; Creatinine, Ser 0.93; Potassium 3.4; Sodium 131 01/25/2024: Hemoglobin 9.5; Platelets 169   Lipid Panel    Component Value Date/Time   CHOL 82 02/21/2023 0830   TRIG 92.0 02/21/2023 0830   HDL 31.40 (L) 02/21/2023 0830   CHOLHDL 3 02/21/2023 0830   VLDL 18.4 02/21/2023 0830   LDLCALC 32 02/21/2023 0830   LDLDIRECT 60.6 09/11/2019 1401     Wt Readings from Last 3 Encounters:  02/14/24 150 lb (68 kg)  02/02/24 154 lb (69.9 kg)  01/22/24 159 lb 13.3 oz (72.5 kg)    Assessment and Plan:   1. Atrial fibrillation, paroxysmal: Sinus today.  -Continue Toprol  and Eliquis     2. Cardiomyopathy: Recent admission with pneumonia. LVEF 45-50% with global hypokinesis. He feels well overall. Given advanced age, will not plan any ischemic testing at this time.  -Continue Toprol .   Labs/ tests ordered today include:  No orders of the defined types were placed in this encounter.  Disposition:   F/U with me in 12 months.   Signed, Lonni Cash, MD 02/14/2024 2:31 PM    [facility] [address] [place],  [place], [place]  [address] Phone: [phone]; Fax: [phone]   "

## 2024-02-15 ENCOUNTER — Ambulatory Visit

## 2024-02-15 DIAGNOSIS — J189 Pneumonia, unspecified organism: Secondary | ICD-10-CM

## 2024-05-01 ENCOUNTER — Ambulatory Visit: Admitting: Internal Medicine

## 2024-06-25 ENCOUNTER — Ambulatory Visit
